# Patient Record
Sex: Female | Born: 1940
Health system: Southern US, Community
[De-identification: ages and names within clinical notes are randomized; demographics above are authoritative.]

## PROBLEM LIST (undated history)

## (undated) DIAGNOSIS — J189 Pneumonia, unspecified organism: Secondary | ICD-10-CM

## (undated) DIAGNOSIS — J449 Chronic obstructive pulmonary disease, unspecified: Secondary | ICD-10-CM

## (undated) DIAGNOSIS — K219 Gastro-esophageal reflux disease without esophagitis: Secondary | ICD-10-CM

## (undated) DIAGNOSIS — Z87442 Personal history of urinary calculi: Secondary | ICD-10-CM

## (undated) DIAGNOSIS — I5032 Chronic diastolic (congestive) heart failure: Secondary | ICD-10-CM

## (undated) DIAGNOSIS — I872 Venous insufficiency (chronic) (peripheral): Secondary | ICD-10-CM

## (undated) DIAGNOSIS — E039 Hypothyroidism, unspecified: Secondary | ICD-10-CM

## (undated) DIAGNOSIS — I10 Essential (primary) hypertension: Secondary | ICD-10-CM

## (undated) DIAGNOSIS — N189 Chronic kidney disease, unspecified: Secondary | ICD-10-CM

## (undated) DIAGNOSIS — J45909 Unspecified asthma, uncomplicated: Secondary | ICD-10-CM

## (undated) DIAGNOSIS — K2950 Unspecified chronic gastritis without bleeding: Secondary | ICD-10-CM

## (undated) DIAGNOSIS — Z9889 Other specified postprocedural states: Secondary | ICD-10-CM

## (undated) DIAGNOSIS — K5732 Diverticulitis of large intestine without perforation or abscess without bleeding: Secondary | ICD-10-CM

## (undated) DIAGNOSIS — H3530 Unspecified macular degeneration: Secondary | ICD-10-CM

## (undated) DIAGNOSIS — R011 Cardiac murmur, unspecified: Secondary | ICD-10-CM

## (undated) DIAGNOSIS — M1990 Unspecified osteoarthritis, unspecified site: Secondary | ICD-10-CM

## (undated) DIAGNOSIS — I82A12 Acute embolism and thrombosis of left axillary vein: Secondary | ICD-10-CM

## (undated) DIAGNOSIS — E785 Hyperlipidemia, unspecified: Secondary | ICD-10-CM

## (undated) DIAGNOSIS — E782 Mixed hyperlipidemia: Secondary | ICD-10-CM

## (undated) DIAGNOSIS — C50919 Malignant neoplasm of unspecified site of unspecified female breast: Secondary | ICD-10-CM

## (undated) DIAGNOSIS — H409 Unspecified glaucoma: Secondary | ICD-10-CM

## (undated) DIAGNOSIS — Z8719 Personal history of other diseases of the digestive system: Secondary | ICD-10-CM

## (undated) DIAGNOSIS — H35039 Hypertensive retinopathy, unspecified eye: Secondary | ICD-10-CM

## (undated) DIAGNOSIS — N200 Calculus of kidney: Secondary | ICD-10-CM

## (undated) DIAGNOSIS — R42 Dizziness and giddiness: Secondary | ICD-10-CM

## (undated) DIAGNOSIS — R112 Nausea with vomiting, unspecified: Secondary | ICD-10-CM

## (undated) DIAGNOSIS — M549 Dorsalgia, unspecified: Secondary | ICD-10-CM

## (undated) DIAGNOSIS — G589 Mononeuropathy, unspecified: Secondary | ICD-10-CM

## (undated) HISTORY — DX: Chronic diastolic (congestive) heart failure: I50.32

## (undated) HISTORY — PX: HERNIA REPAIR: SHX51

## (undated) HISTORY — DX: Hypertensive retinopathy, unspecified eye: H35.039

## (undated) HISTORY — DX: Malignant neoplasm of unspecified site of unspecified female breast: C50.919

## (undated) HISTORY — DX: Unspecified asthma, uncomplicated: J45.909

## (undated) HISTORY — PX: EYE SURGERY: SHX253

## (undated) HISTORY — DX: Gastro-esophageal reflux disease without esophagitis: K21.9

## (undated) HISTORY — DX: Mononeuropathy, unspecified: G58.9

## (undated) HISTORY — DX: Unspecified macular degeneration: H35.30

## (undated) HISTORY — PX: ABDOMINAL HYSTERECTOMY: SHX81

## (undated) HISTORY — PX: CATARACT EXTRACTION: SUR2

## (undated) HISTORY — DX: Calculus of kidney: N20.0

## (undated) HISTORY — DX: Chronic kidney disease, unspecified: N18.9

## (undated) HISTORY — PX: CHOLECYSTECTOMY: SHX55

## (undated) HISTORY — PX: COLOSTOMY CLOSURE: SHX1381

## (undated) HISTORY — DX: Mixed hyperlipidemia: E78.2

## (undated) HISTORY — PX: OTHER SURGICAL HISTORY: SHX169

## (undated) HISTORY — DX: Diverticulitis of large intestine without perforation or abscess without bleeding: K57.32

## (undated) HISTORY — PX: BACK SURGERY: SHX140

## (undated) HISTORY — DX: Unspecified glaucoma: H40.9

## (undated) HISTORY — PX: COLON SURGERY: SHX602

## (undated) HISTORY — DX: Dizziness and giddiness: R42

## (undated) HISTORY — DX: Venous insufficiency (chronic) (peripheral): I87.2

## (undated) HISTORY — DX: Hyperlipidemia, unspecified: E78.5

---

## 2000-06-20 ENCOUNTER — Inpatient Hospital Stay (HOSPITAL_COMMUNITY): Admission: RE | Admit: 2000-06-20 | Discharge: 2000-06-22 | Payer: Self-pay | Admitting: Neurosurgery

## 2000-07-22 ENCOUNTER — Encounter: Admission: RE | Admit: 2000-07-22 | Discharge: 2000-07-22 | Payer: Self-pay | Admitting: Neurosurgery

## 2000-09-27 ENCOUNTER — Encounter: Admission: RE | Admit: 2000-09-27 | Discharge: 2000-09-27 | Payer: Self-pay | Admitting: Neurosurgery

## 2000-12-09 ENCOUNTER — Ambulatory Visit (HOSPITAL_COMMUNITY): Admission: RE | Admit: 2000-12-09 | Discharge: 2000-12-09 | Payer: Self-pay | Admitting: Internal Medicine

## 2000-12-09 ENCOUNTER — Encounter: Payer: Self-pay | Admitting: Internal Medicine

## 2000-12-23 ENCOUNTER — Encounter: Admission: RE | Admit: 2000-12-23 | Discharge: 2000-12-23 | Payer: Self-pay | Admitting: Neurosurgery

## 2001-01-30 ENCOUNTER — Other Ambulatory Visit: Admission: RE | Admit: 2001-01-30 | Discharge: 2001-01-30 | Payer: Self-pay | Admitting: *Deleted

## 2001-03-09 ENCOUNTER — Ambulatory Visit (HOSPITAL_COMMUNITY): Admission: RE | Admit: 2001-03-09 | Discharge: 2001-03-09 | Payer: Self-pay | Admitting: Family Medicine

## 2001-03-09 ENCOUNTER — Encounter: Payer: Self-pay | Admitting: Family Medicine

## 2001-05-22 ENCOUNTER — Encounter: Payer: Self-pay | Admitting: Family Medicine

## 2001-05-22 ENCOUNTER — Ambulatory Visit (HOSPITAL_COMMUNITY): Admission: RE | Admit: 2001-05-22 | Discharge: 2001-05-22 | Payer: Self-pay | Admitting: Family Medicine

## 2001-11-24 ENCOUNTER — Ambulatory Visit (HOSPITAL_COMMUNITY): Admission: RE | Admit: 2001-11-24 | Discharge: 2001-11-24 | Payer: Self-pay | Admitting: Family Medicine

## 2001-11-24 ENCOUNTER — Encounter: Payer: Self-pay | Admitting: Family Medicine

## 2001-11-30 ENCOUNTER — Ambulatory Visit (HOSPITAL_COMMUNITY): Admission: RE | Admit: 2001-11-30 | Discharge: 2001-11-30 | Payer: Self-pay | Admitting: Urology

## 2001-11-30 ENCOUNTER — Encounter: Payer: Self-pay | Admitting: Urology

## 2002-01-30 ENCOUNTER — Encounter: Payer: Self-pay | Admitting: Emergency Medicine

## 2002-01-30 ENCOUNTER — Emergency Department (HOSPITAL_COMMUNITY): Admission: EM | Admit: 2002-01-30 | Discharge: 2002-01-30 | Payer: Self-pay | Admitting: Emergency Medicine

## 2002-03-16 ENCOUNTER — Ambulatory Visit (HOSPITAL_COMMUNITY): Admission: RE | Admit: 2002-03-16 | Discharge: 2002-03-16 | Payer: Self-pay | Admitting: Urology

## 2002-03-16 ENCOUNTER — Encounter: Payer: Self-pay | Admitting: Urology

## 2002-05-28 ENCOUNTER — Ambulatory Visit (HOSPITAL_COMMUNITY): Admission: RE | Admit: 2002-05-28 | Discharge: 2002-05-28 | Payer: Self-pay | Admitting: *Deleted

## 2002-05-28 ENCOUNTER — Encounter: Payer: Self-pay | Admitting: *Deleted

## 2003-05-10 ENCOUNTER — Ambulatory Visit (HOSPITAL_COMMUNITY): Admission: RE | Admit: 2003-05-10 | Discharge: 2003-05-10 | Payer: Self-pay | Admitting: Family Medicine

## 2003-05-15 ENCOUNTER — Ambulatory Visit (HOSPITAL_COMMUNITY): Admission: RE | Admit: 2003-05-15 | Discharge: 2003-05-15 | Payer: Self-pay | Admitting: Family Medicine

## 2003-06-05 ENCOUNTER — Ambulatory Visit (HOSPITAL_COMMUNITY): Admission: RE | Admit: 2003-06-05 | Discharge: 2003-06-05 | Payer: Self-pay | Admitting: *Deleted

## 2003-08-15 ENCOUNTER — Ambulatory Visit (HOSPITAL_COMMUNITY): Admission: RE | Admit: 2003-08-15 | Discharge: 2003-08-15 | Payer: Self-pay | Admitting: Family Medicine

## 2003-08-27 ENCOUNTER — Encounter (HOSPITAL_COMMUNITY): Admission: RE | Admit: 2003-08-27 | Discharge: 2003-09-26 | Payer: Self-pay | Admitting: Neurosurgery

## 2003-10-01 ENCOUNTER — Encounter (HOSPITAL_COMMUNITY): Admission: RE | Admit: 2003-10-01 | Discharge: 2003-10-31 | Payer: Self-pay | Admitting: Neurosurgery

## 2003-11-06 ENCOUNTER — Encounter (HOSPITAL_COMMUNITY): Admission: RE | Admit: 2003-11-06 | Discharge: 2003-12-06 | Payer: Self-pay | Admitting: General Surgery

## 2004-07-23 ENCOUNTER — Ambulatory Visit (HOSPITAL_COMMUNITY): Admission: RE | Admit: 2004-07-23 | Discharge: 2004-07-23 | Payer: Self-pay | Admitting: *Deleted

## 2005-02-03 ENCOUNTER — Encounter (HOSPITAL_COMMUNITY): Admission: RE | Admit: 2005-02-03 | Discharge: 2005-02-04 | Payer: Self-pay | Admitting: Internal Medicine

## 2005-04-27 ENCOUNTER — Ambulatory Visit: Payer: Self-pay | Admitting: Internal Medicine

## 2005-05-17 ENCOUNTER — Ambulatory Visit (HOSPITAL_COMMUNITY): Admission: RE | Admit: 2005-05-17 | Discharge: 2005-05-17 | Payer: Self-pay | Admitting: Internal Medicine

## 2005-05-17 ENCOUNTER — Encounter (INDEPENDENT_AMBULATORY_CARE_PROVIDER_SITE_OTHER): Payer: Self-pay | Admitting: Internal Medicine

## 2005-05-17 ENCOUNTER — Ambulatory Visit: Payer: Self-pay | Admitting: Internal Medicine

## 2005-06-11 ENCOUNTER — Inpatient Hospital Stay (HOSPITAL_COMMUNITY): Admission: EM | Admit: 2005-06-11 | Discharge: 2005-06-23 | Payer: Self-pay | Admitting: Emergency Medicine

## 2005-06-11 ENCOUNTER — Ambulatory Visit: Payer: Self-pay | Admitting: *Deleted

## 2005-06-30 ENCOUNTER — Ambulatory Visit (HOSPITAL_COMMUNITY): Admission: RE | Admit: 2005-06-30 | Discharge: 2005-06-30 | Payer: Self-pay | Admitting: Family Medicine

## 2005-08-02 ENCOUNTER — Ambulatory Visit (HOSPITAL_COMMUNITY): Admission: RE | Admit: 2005-08-02 | Discharge: 2005-08-02 | Payer: Self-pay | Admitting: Family Medicine

## 2005-10-19 ENCOUNTER — Encounter (HOSPITAL_COMMUNITY): Admission: RE | Admit: 2005-10-19 | Discharge: 2005-11-18 | Payer: Self-pay | Admitting: Endocrinology

## 2006-07-14 ENCOUNTER — Inpatient Hospital Stay (HOSPITAL_COMMUNITY): Admission: EM | Admit: 2006-07-14 | Discharge: 2006-07-28 | Payer: Self-pay | Admitting: Emergency Medicine

## 2006-07-15 ENCOUNTER — Ambulatory Visit: Payer: Self-pay | Admitting: Internal Medicine

## 2006-07-18 ENCOUNTER — Encounter (INDEPENDENT_AMBULATORY_CARE_PROVIDER_SITE_OTHER): Payer: Self-pay | Admitting: *Deleted

## 2006-09-29 ENCOUNTER — Ambulatory Visit: Payer: Self-pay | Admitting: *Deleted

## 2006-10-04 ENCOUNTER — Ambulatory Visit (HOSPITAL_COMMUNITY): Admission: RE | Admit: 2006-10-04 | Discharge: 2006-10-04 | Payer: Self-pay | Admitting: *Deleted

## 2006-10-04 ENCOUNTER — Encounter (INDEPENDENT_AMBULATORY_CARE_PROVIDER_SITE_OTHER): Payer: Self-pay | Admitting: *Deleted

## 2006-10-04 ENCOUNTER — Ambulatory Visit: Payer: Self-pay | Admitting: *Deleted

## 2006-10-20 ENCOUNTER — Ambulatory Visit: Payer: Self-pay | Admitting: *Deleted

## 2006-11-21 ENCOUNTER — Inpatient Hospital Stay (HOSPITAL_COMMUNITY): Admission: RE | Admit: 2006-11-21 | Discharge: 2006-11-27 | Payer: Self-pay | Admitting: General Surgery

## 2007-04-18 ENCOUNTER — Ambulatory Visit (HOSPITAL_COMMUNITY): Admission: RE | Admit: 2007-04-18 | Discharge: 2007-04-18 | Payer: Self-pay | Admitting: Family Medicine

## 2009-01-08 ENCOUNTER — Emergency Department (HOSPITAL_COMMUNITY): Admission: EM | Admit: 2009-01-08 | Discharge: 2009-01-08 | Payer: Self-pay | Admitting: Psychiatry

## 2009-01-28 HISTORY — PX: NM MYOCAR PERF WALL MOTION: HXRAD629

## 2009-07-02 ENCOUNTER — Ambulatory Visit (HOSPITAL_COMMUNITY): Admission: RE | Admit: 2009-07-02 | Discharge: 2009-07-02 | Payer: Self-pay | Admitting: Family Medicine

## 2010-02-11 HISTORY — PX: US ECHOCARDIOGRAPHY: HXRAD669

## 2010-02-16 ENCOUNTER — Emergency Department (HOSPITAL_COMMUNITY): Admission: EM | Admit: 2010-02-16 | Discharge: 2010-02-16 | Payer: Self-pay | Admitting: Emergency Medicine

## 2010-03-19 ENCOUNTER — Encounter: Admission: RE | Admit: 2010-03-19 | Discharge: 2010-03-19 | Payer: Self-pay | Admitting: Neurosurgery

## 2010-04-07 ENCOUNTER — Encounter (HOSPITAL_COMMUNITY): Admission: RE | Admit: 2010-04-07 | Discharge: 2010-05-07 | Payer: Self-pay | Admitting: Otolaryngology

## 2010-04-30 ENCOUNTER — Encounter (INDEPENDENT_AMBULATORY_CARE_PROVIDER_SITE_OTHER): Payer: Self-pay | Admitting: *Deleted

## 2010-05-07 ENCOUNTER — Encounter (HOSPITAL_COMMUNITY)
Admission: RE | Admit: 2010-05-07 | Discharge: 2010-06-06 | Payer: Self-pay | Source: Home / Self Care | Admitting: Neurosurgery

## 2010-05-12 ENCOUNTER — Encounter (HOSPITAL_COMMUNITY)
Admission: RE | Admit: 2010-05-12 | Discharge: 2010-06-11 | Payer: Self-pay | Source: Home / Self Care | Attending: Otolaryngology | Admitting: Otolaryngology

## 2010-06-08 ENCOUNTER — Encounter (HOSPITAL_COMMUNITY)
Admission: RE | Admit: 2010-06-08 | Discharge: 2010-07-08 | Payer: Self-pay | Source: Home / Self Care | Attending: Neurosurgery | Admitting: Neurosurgery

## 2010-07-25 ENCOUNTER — Encounter: Payer: Self-pay | Admitting: *Deleted

## 2010-08-04 NOTE — Letter (Signed)
Summary: Recall, Screening Colonoscopy Only  [facility]  [address]   [place], [place] [address]   Phone: [phone]  Fax: [phone]    April 30, 2010  [name] [address] [place], [place]  [address] 04-02-1941   Dear [name],   Our records indicate it is time to schedule your colonoscopy.    Please call our office at [phone] and ask for the nurse.   Thank you,    Hendricks Limes, LPN Cloria Spring, LPN  [facility] Ph: [phone]   Fax: [phone]

## 2010-09-24 ENCOUNTER — Ambulatory Visit (HOSPITAL_COMMUNITY)
Admission: RE | Admit: 2010-09-24 | Discharge: 2010-09-24 | Disposition: A | Discharge status: Home / Self Care | Payer: Medicare Other | Source: Ambulatory Visit | Attending: Family Medicine | Admitting: Family Medicine

## 2010-09-24 ENCOUNTER — Encounter (HOSPITAL_COMMUNITY): Payer: Self-pay

## 2010-09-24 ENCOUNTER — Other Ambulatory Visit (HOSPITAL_COMMUNITY): Payer: Self-pay | Admitting: Family Medicine

## 2010-09-24 DIAGNOSIS — R05 Cough: Secondary | ICD-10-CM

## 2010-09-24 DIAGNOSIS — R059 Cough, unspecified: Secondary | ICD-10-CM

## 2010-09-24 DIAGNOSIS — R509 Fever, unspecified: Secondary | ICD-10-CM | POA: Insufficient documentation

## 2010-09-24 DIAGNOSIS — J069 Acute upper respiratory infection, unspecified: Secondary | ICD-10-CM

## 2010-09-24 HISTORY — DX: Essential (primary) hypertension: I10

## 2010-10-08 ENCOUNTER — Ambulatory Visit (INDEPENDENT_AMBULATORY_CARE_PROVIDER_SITE_OTHER): Payer: Medicare Other | Admitting: Otolaryngology

## 2010-10-08 DIAGNOSIS — J343 Hypertrophy of nasal turbinates: Secondary | ICD-10-CM

## 2010-10-08 DIAGNOSIS — J310 Chronic rhinitis: Secondary | ICD-10-CM

## 2010-10-08 DIAGNOSIS — J320 Chronic maxillary sinusitis: Secondary | ICD-10-CM

## 2010-10-11 LAB — DIFFERENTIAL
Eosinophils Absolute: 0.1 10*3/uL (ref 0.0–0.7)
Eosinophils Relative: 1 % (ref 0–5)
Lymphocytes Relative: 40 % (ref 12–46)
Lymphs Abs: 3.9 10*3/uL (ref 0.7–4.0)
Monocytes Absolute: 0.6 10*3/uL (ref 0.1–1.0)

## 2010-10-11 LAB — CK TOTAL AND CKMB (NOT AT ARMC)
CK, MB: 3.3 ng/mL (ref 0.3–4.0)
Relative Index: 3.3 — ABNORMAL HIGH (ref 0.0–2.5)

## 2010-10-11 LAB — COMPREHENSIVE METABOLIC PANEL
Albumin: 4.7 g/dL (ref 3.5–5.2)
CO2: 30 mEq/L (ref 19–32)
Calcium: 10.4 mg/dL (ref 8.4–10.5)
GFR calc Af Amer: >60 mL/min (ref >60)
Glucose, Bld: 110 mg/dL — ABNORMAL HIGH (ref 70–99)
Potassium: 3.3 mEq/L — ABNORMAL LOW (ref 3.5–5.1)
Sodium: 140 mEq/L (ref 135–145)
Total Protein: 7.7 g/dL (ref 6.0–8.3)

## 2010-10-11 LAB — CBC
HCT: 42.2 % (ref 36.0–46.0)
MCHC: 34.8 g/dL (ref 30.0–36.0)
RBC: 4.61 MIL/uL (ref 3.87–5.11)
RDW: 13.5 % (ref 11.5–15.5)

## 2010-10-11 LAB — POCT CARDIAC MARKERS: Myoglobin, poc: 123 ng/mL (ref 12–200)

## 2010-10-14 ENCOUNTER — Other Ambulatory Visit (INDEPENDENT_AMBULATORY_CARE_PROVIDER_SITE_OTHER): Payer: Self-pay | Admitting: Otolaryngology

## 2010-10-14 DIAGNOSIS — J329 Chronic sinusitis, unspecified: Secondary | ICD-10-CM

## 2010-10-19 ENCOUNTER — Ambulatory Visit (HOSPITAL_COMMUNITY)
Admission: RE | Admit: 2010-10-19 | Discharge: 2010-10-19 | Disposition: A | Discharge status: Home / Self Care | Payer: Medicare Other | Source: Ambulatory Visit | Attending: Otolaryngology | Admitting: Otolaryngology

## 2010-10-19 DIAGNOSIS — R51 Headache: Secondary | ICD-10-CM | POA: Insufficient documentation

## 2010-10-19 DIAGNOSIS — J3489 Other specified disorders of nose and nasal sinuses: Secondary | ICD-10-CM | POA: Insufficient documentation

## 2010-10-19 DIAGNOSIS — R059 Cough, unspecified: Secondary | ICD-10-CM | POA: Insufficient documentation

## 2010-10-19 DIAGNOSIS — R05 Cough: Secondary | ICD-10-CM | POA: Insufficient documentation

## 2010-10-19 DIAGNOSIS — J329 Chronic sinusitis, unspecified: Secondary | ICD-10-CM

## 2010-10-29 ENCOUNTER — Ambulatory Visit (INDEPENDENT_AMBULATORY_CARE_PROVIDER_SITE_OTHER): Payer: Medicare Other | Admitting: Otolaryngology

## 2010-10-29 DIAGNOSIS — J310 Chronic rhinitis: Secondary | ICD-10-CM

## 2010-10-29 DIAGNOSIS — R490 Dysphonia: Secondary | ICD-10-CM

## 2010-11-20 NOTE — Consult Note (Signed)
[name], [name]               ACCOUNT NO.:  [id number]   MEDICAL RECORD NO.:  [id number]          PATIENT TYPE:  INP   LOCATION:  A203                          FACILITY:  [facility]   PHYSICIAN:  [name], M.D.DATE OF BIRTH:  1940/11/26   DATE OF CONSULTATION:  06/15/2005  DATE OF DISCHARGE:                                   CONSULTATION   REFERRING PHYSICIAN:  Patrica [name], M.D.   REASON FOR CONSULTATION:  Persistent bronchospasm.   HISTORY OF PRESENT ILLNESS:  This is a 70 year old who has a long known  history of possible COPD.  She says that she has been sick for about a  month.  She has been in and out of Dr. Geanie [name] office with congestion and  cough.  She has been being treated with antibiotics, steroids, etc.  She has  had increasing symptoms in the last several days to the point that she was  admitted because of her continued symptoms.   PAST MEDICAL HISTORY:  1.  History of thyroid nodules.  2.  Rheumatic fever in 1961.  3.  Helicobacter pylori gastritis in 1997.  4.  Laparoscopic cholecystectomy in 1999.  5.  Bladder tacking.  6.  Hysterectomy.  7.  Cervical spine surgery.  8.  Bilateral laser eye surgery.  9.  She says that she has had some reflux disease as well.  10. In the outpatient arena she has been treated with cough medications,      antibiotics and steroids.   MEDICATIONS ON ADMISSION:  1.  Levoxyl 50 mcg daily.  2.  Nexium 40 mg daily.  3.  Aspirin 81 mg daily.  4.  Advair 250/50 b.i.d.  5.  Prednisone.   ALLERGIES:  CODEINE, DOLOBID, IBUPROFEN and SULFA.   FAMILY HISTORY:  She says she does not know of any family history of any  sort of lung disease.   SOCIAL HISTORY:  She smokes about a pack of cigarettes daily.  She does not  drink any alcohol.   REVIEW OF SYSTEMS:  Except as mentioned is essentially negative.   PHYSICAL EXAMINATION:  GENERAL:  Physical exam shows a somewhat obese female  who is in no acute distress.  VITAL  SIGNS:  Temperature is 98.2, blood pressure 142/72, pulse 74,  respirations 24.  HEENT:  Her pupils react to light and accommodation.  Nose and throat are  clear.  NECK:  Supple without masses.  CHEST:  Her chest shows rhonchi bilaterally.  She sounds somewhat tight.  HEART:  Her heart sounds are distant.  I do not hear a murmur.  It is been  said that she has one, but I cannot hear it now.  ABDOMEN:  Her abdomen is soft without masses.  EXTREMITIES:  Showed no edema.  NEUROLOGIC:  Grossly intact.   LABORATORY DATA AND X-RAY FINDINGS:  Lab work shows white count 14,900,  hemoglobin 12.1, platelets 275.  BMET shows her glucose is 310, BUN of 10,  creatinine 0.8.   She had CT of the chest which shows no evidence of pulmonary embolus, no  pleural or  pericardial effusion, atelectasis in the left lower lobe, some  scarring, some ground glass opacities in the left upper lobe.   ASSESSMENT:  She has probably chronic obstructive pulmonary disease.  I  suspect that she has a pneumonia and that is what we are seeing on the x-ray  and computed tomography.  With her not having any evidence of embolus, of  course, that is good news and rules out one of the causes of her persistent  bronchospasm.  She has not had an echocardiogram recently.   RECOMMENDATIONS:  I would go ahead and do one because of her history of  rheumatic fever.  I agree with treatments with steroids, inhaled  bronchodilators and antibiotics.      [name], M.D.  Electronically Signed     ELH/MEDQ  D:  06/15/2005  T:  06/15/2005  Job:  [id number]

## 2010-11-20 NOTE — Op Note (Signed)
[name], [name]               ACCOUNT NO.:  [id number]   MEDICAL RECORD NO.:  [id number]          PATIENT TYPE:  AMB   LOCATION:  DAY                           FACILITY:  [facility]   PHYSICIAN:  Lionel December, M.D.    DATE OF BIRTH:  07-Jan-1941   DATE OF PROCEDURE:  05/17/2005  DATE OF DISCHARGE:                                 OPERATIVE REPORT   PROCEDURE:  Colonoscopy.   INDICATIONS:  [name] is a 70 year old Caucasian female who is undergoing  colonoscopy both for diagnostic and screening purposes. She has been having  intermittent hematochezia felt to be secondary to hemorrhoids. Family  history is positive for colon carcinoma in her sister who was in her late  39s at the time of diagnosis. [name]'s last colonoscopy was in June 1998 with  removal of small polyps which were inflammatory and were hyperplastic.  Procedure risks were reviewed with the patient, and informed consent was  obtained.   PREOPERATIVE MEDICATIONS:  Demerol 25 mg IV, Versed 5 mg IV in divided dose.   FINDINGS:  Procedure performed in endoscopy suite. The patient's vital signs  and O2 saturation were monitored during the procedure and remained stable.  The patient was placed in left lateral position, and rectal examination  performed. No abnormality noted on external or digital exam. Olympus  videoscope was placed in rectum and advanced under vision into sigmoid colon  and beyond. Preparation was excellent. Few diverticula were noted at sigmoid  colon. Scope was advanced to cecum which was identified by ileocecal valve  and appendiceal orifice. Pictures taken for the record. As the scope was  withdrawn, colonic mucosa was carefully examined. There was a small polyp at  ascending colon which was ablated via cold biopsy. Two more polyps were  noted at hepatic flexure. One was small and ablated via cold biopsy.  Approach to the other one was difficulty. It was biopsied for histology, and  the rest of the polyp  was coagulated using snare tip. Endoscopically, it  appeared hyperplastic polyp to me. Both of these polyps were submitted in  one container. The mucosa of the rest of the colon was normal. Rectal mucosa  similarly was normal. Scope was retroflexed to examine anorectal junction  which was unremarkable. Endoscope was straightened and withdrawn. The  patient tolerated the procedure well.   FINAL DIAGNOSES:  1.  Few diverticula at sigmoid colon.  2.  Small polyp ablated via cold biopsy at ascending colon. Another small      polyp at hepatic flexure was ablated via cold biopsy, and third polyp      was larger and sessile. It was biopsied for histology, and rest was      coagulated using snare tip.   RECOMMENDATIONS:  1.  Standard instructions given.  2.  High fiber diet.  3.  I will be contacting patient with biopsy results and timing of her next      exam.      Lionel December, M.D.  Electronically Signed     NR/MEDQ  D:  05/17/2005  T:  05/17/2005  Job:  [id number]   cc:   Patrica Duel, M.D.  Fax: [phone]

## 2010-11-20 NOTE — Op Note (Signed)
[facility]  Patient:    [name], [name]                      MRN: [id number] Proc. Date: 06/20/00 Adm. Date:  70 Attending:  Tressie Stalker D                           Operative Report  PREOPERATIVE DIAGNOSIS:  C4-5 and C5-6 herniated nucleus pulposus, spondylosis, spinal stenosis, cervical degenerative disk disease.  POSTOPERATIVE DIAGNOSIS:  C4-5 and C5-6 herniated nucleus pulposus, spondylosis, spinal stenosis, cervical degenerative disk disease.  OPERATION PERFORMED:  C4-5 and C5-6 extensive anterior cervical diskectomy, interbody iliac crest allograft arthrodesis, anterior cervical plating C4 to C6 using Codman titanium anterior plate and screws.  SURGEON:  Cristi Loron, M.D.  ASSISTANT:  Hewitt Shorts, M.D.  ANESTHESIA:  General endotracheal.  ESTIMATED BLOOD LOSS:  150 cc.  SPECIMENS:  None.  DRAINS:  None.  COMPLICATIONS:  None.  INDICATIONS FOR PROCEDURE:  The patient is a 70 year old white female who suffered from neck and bilateral arm pain with hand numbness for several months.  She failed medical management and work-up with a cervical MRI demonstrated significant spondylosis and spinal stenosis at C4-5 and C5-6.  I therefore discussed surgery with her.  She weighed the risks, benefits and alternatives to surgery and decided to proceed with a two-level anterior cervical diskectomy and fusion plating.  DESCRIPTION OF PROCEDURE:  The patient was brought to the operating room by the anesthesia team.  General endotracheal anesthesia was induced.  The patient remained in supine position.  A roll was placed under her shoulders to place her neck in slight extension.  Her anterior cervical region was then prepared with Betadine scrub and Betadine solution.  Sterile drapes were applied.  I then injected the area to be incised with Marcaine with epinephrine solution and used a scalpel to make a transverse  incision in the patients left anterior neck.  I then used the Metzenbaum scissors to dissect down to the platysma muscle and divided along the direction of the skin incision and then dissected medial to the sternocleidomastoid muscle, jugular vein and carotid artery. I carefully dissected down to the anterior cervical spine identifying the esophagus and carefully retracting it medially.  I cleared the soft tissue from the anterior cervical spine using Kittner swabs. I then obtained intraoperative radiograph to confirm my location after I inserted a bent spinal needle into the exposed interspace.  I then used electrocautery to detach the medial border of the longus colli muscle bilaterally from the C4-5 and C5-6 intervertebral disk space.  I inserted the Caspar self-retaining retractor for exposure.  I began the decompression at C5-6.  There was a lot of spondylosis anteriorly.  I drilled away some anterior bone spurs from the disk space and then sized these to C5-6 intervertebral disk.  I performed a partial diskectomy with the pituitaries and forceps and the Carlens curets.  I then inserted distraction screws in C5-6 distracting the interspace and then decorticating the C5 and C6 vertebral end plates drilling away the remainder of the intervertebral disk.  It was quite a small disk and there was a lot of spondylosis.  I drilled away the bone spurs with a high speed drill.  I thinned out the posterior longitudinal ligament with a drill, incised it with the arachnoid knife and then removed it with a Kerrison punch undercutting the  vertebral end plates at C5 and C6.  I identified the C6 nerve roots and performed a generous foraminotomy about it bilaterally.  I had good decompression of the thecal sac and bilateral C6 nerve roots.   I then repeated this procedure at C4-5 removing these distraction screw at C6 placing the C4 distracting interspace, drilled away some anterior osteophytes  incising the C4-5 intervertebral disk, performed a partial diskectomy with the pituitary forceps, then using the drill to drill away the remainder of the intervertebral disk. I thinned out the posterior longitudinal ligament drilling off some bone spurs.  I incised the ligament with the arachnoid knife and then removed it with a Kerrison punch undercutting the vertebral end plates at V3-7 decompressing the thecal sac.  I performed a foraminotomy about the bilateral C5 nerve root.  At this point I had a good decompression of the thecal sac at C4-5 and the bilateral C5 nerve roots.  Having completed the extensive anterior cervical diskectomy, I now turned my attention to the interbody arthrodesis.  I obtained an iliac crest tricortical allograft bone graft and fashioned in to these approximate dimensions, 7 mm in height, 1 cm in depth. I inserted one bone graft into distracted C4-5 interspace, removed the distraction screw from C4, placed in C6 distraction C5-6 interspace.  Put the second bone graft at the C5-6 interspace.  I removed the distraction screw. There was good snug fit of the bone graft at both levels.  Having completed the arthrodesis, I now turned my attention to the anterior spinal instrumentation.  I drilled away some bone spurs from the anterior vertebral bodies at C4-5 and C5-6, so that the plate would lie relatively flat.  I then obtained the appropriate length Codman anterior cervical plate laying along the anterior aspect of C4 to C6.  I drilled two holes at C4, two at C5 and two at C6, tapped these holes and secured the plates to the vertebral bodies with 12 mm screws.  I then obtained the intraoperative radiograph.  It demonstrated good position of the plates, screws and interbody grafts.  I then secured the screws to the plate with a [facility] tightner.   I copiously irrigated the wound out with bacitracin solution and removed the solution and achieved stringent  hemostasis.  I then removed the Caspar self-retaining retractor.  I inspected ____________ any damage and there was none.  I then reapproximated the patients platysma muscle with interrupted  3-0 Vicryl and subcutaneous tissues with interrupted 3-0 Vicryl, the skin with Steri-Strips and benzoin.  The wound was then coated with bacitracin ointment. Sterile dressing was applied, the drapes were removed.  The patient was subsequently extubated by the anesthesia team and transported to the post anesthesia care unit in stable condition.  All sponge, needle and instrument counts were correct at the end of this case. DD:  06/20/00 TD:  06/21/00 Job: [id number]

## 2010-11-20 NOTE — Op Note (Signed)
[name], [name]               ACCOUNT NO.:  [id number]   MEDICAL RECORD NO.:  [id number]          PATIENT TYPE:  INP   LOCATION:  A304                          FACILITY:  [facility]   PHYSICIAN:  R. Roetta Sessions, M.D. DATE OF BIRTH:  [birth year]   DATE OF PROCEDURE:  07/15/2006  DATE OF DISCHARGE:  07/28/2006                               OPERATIVE REPORT   INDICATIONS FOR PROCEDURE:  The patient is a 70 year old lady with  recent fairly acute onset lower abdominal pain and reported diarrhea for  10 days.  She has had two CT scans which I have personally reviewed with  the radiologist.   Appears she does have a bolus of stool in her distal sigmoid colon.  There is no inflammatory changes in this area and no free air or other  acute changes.   She has not responded to conservative measures and the etiology for  abdominal pain was not yet been well-defined.  I am concerned she may be  having spurious diarrhea around a more proximal impaction.  Sigmoidoscopy now being done.  This approach has discussed the patient  at length.  Potential risks, benefits and was have been reviewed.   PROCEDURE NOTE:  Patient placed left lateral decubitus position. O2  saturation, blood pressure, pulse and respirations monitored throughout  the entire procedure.   INSTRUMENT:  Pentax video chip system.   Digital rectal exam revealed no abnormalities.   ENDOSCOPIC FINDINGS:  This was an unprepped procedure.  The rectal  mucosa including a retroflex view of the anal verge appeared normal.  At  the rectosigmoid junction and the lumen of the lower bowel appeared to  be plugged with a wall of underlying formed stool.  The level of this  apparent proximal impaction was 22 cm.  The patient tolerated the  procedure well.   ASSESSMENT:  The lumen of rectosigmoid occluded with stool at 22 cm.   RECOMMENDATIONS:  Will go ahead and obtained a diagnostic therapeutic  Gastrografin enema into the near future.   Further recommendations to  follow.      Jonathon Bellows, M.D.  Electronically Signed     RMR/MEDQ  D:  08/03/2006  T:  08/03/2006  Job:  [id number]   cc:   Patrica Duel, M.D.  Fax: [phone]

## 2010-11-20 NOTE — Group Therapy Note (Signed)
NAMEEMAAN, GARY               ACCOUNT NO.:  [id number]   MEDICAL RECORD NO.:  [id number]          PATIENT TYPE:  INP   LOCATION:  A203                          FACILITY:  [facility]   PHYSICIAN:  Edward L. Juanetta Gosling, M.D.DATE OF BIRTH:  Apr 10, 1941   DATE OF PROCEDURE:  DATE OF DISCHARGE:                                   PROGRESS NOTE   Mrs. Fojtik says she feels better. She brought up a great deal of sputum  with the Mucomyst. She is overall feeling better and has no new complaints.   Her exam shows her temperature is 97, pulse 82, respirations 20, blood sugar  144, blood pressure 136/72. O2 sat is 94% on 2 liters. Her chest is clearer  but still with problems.   Assessment is that she is doing better.   Plan is to continue with current treatment. She is to have an echocardiogram  today.      Edward L. Juanetta Gosling, M.D.  Electronically Signed     ELH/MEDQ  D:  06/16/2005  T:  06/16/2005  Job:  [id number]

## 2010-11-20 NOTE — Group Therapy Note (Signed)
NAMEMARRIAN, BELLS               ACCOUNT NO.:  [id number]   MEDICAL RECORD NO.:  [id number]          PATIENT TYPE:  INP   LOCATION:  A203                          FACILITY:  [facility]   PHYSICIAN:  Edward L. Juanetta Gosling, M.D.DATE OF BIRTH:  09-28-1940   DATE OF PROCEDURE:  06/18/2005  DATE OF DISCHARGE:                                   PROGRESS NOTE   SUBJECTIVE:  Ms. Hoggard seems to be gradually improving.  Dr. Nobie Putnam has  ordered physical therapy to try to get her more active.   OBJECTIVE:  VITAL SIGNS:  Her exam shows temperature 97.1, pulse 82,  respirations 20, blood sugar 166, blood pressure 152/76, O2 saturations 96%  on 2 L.  CHEST:  She still has rhonchi bilaterally.   ASSESSMENT:  She is better and slowly improving.   PLAN:  Plan is to continue treatments and follow.      Edward L. Juanetta Gosling, M.D.  Electronically Signed     ELH/MEDQ  D:  06/18/2005  T:  06/18/2005  Job:  [id number]

## 2010-11-20 NOTE — Group Therapy Note (Signed)
[name]               ACCOUNT NO.:  [id number]   MEDICAL RECORD NO.:  [id number]          PATIENT TYPE:  INP   LOCATION:  A304                          FACILITY:  [facility]   PHYSICIAN:  Edward L. Juanetta Gosling, M.D.DATE OF BIRTH:  01-Jan-1941   DATE OF PROCEDURE:  06/23/2005  DATE OF DISCHARGE:                                   PROGRESS NOTE   SUBJECTIVE:  [name] seems to be doing better in general and, in fact,  her chest is clear today.  She still breathing rapidly, however.   OBJECTIVE:  CHESTS:  Clear.  VITAL SIGNS:  Shows a temperature is 97.7, pulse 75, respirations 22, blood  sugar 190, blood pressure 132/67, O2 saturations 94% on room air.   ASSESSMENT:  She is much improved.   PLAN:  I am going to go ahead and sign off at this point.  Thank you very  much for allowing me to see her with you.  I will of course be happy to see  her in my office for follow-up at your request.      Ramon Dredge L. Juanetta Gosling, M.D.  Electronically Signed     ELH/MEDQ  D:  06/23/2005  T:  06/24/2005  Job:  [id number]

## 2010-11-20 NOTE — Discharge Summary (Signed)
NAME:  [name], CARLES NO.:  [id number]   MEDICAL RECORD NO.:  [id number]          PATIENT TYPE:  INP   LOCATION:  A326                          FACILITY:  [facility]   PHYSICIAN:  Dalia Heading, M.D.  DATE OF BIRTH:  1940/12/01   DATE OF ADMISSION:  11/21/2006  DATE OF DISCHARGE:  05/25/2008LH                               DISCHARGE SUMMARY   HOSPITAL COURSE SUMMARY:  The patient is a 70 year old white female  status post a Hartmann's procedure for perforated sigmoid diverticulitis  who presented to day surgery for reversal of her colostomy.  This was  performed on 11/21/2006.  Her postoperative course has been remarkable  only for pain control.  Her diet was advanced without difficulty once  her bowel function returned.  She has been ambulating  in the hallway  well.  She is being discharged home on 11/27/2006 in good improving  condition.   DISCHARGE INSTRUCTIONS:  The patient is to follow up with Dr. Franky Macho on 12/01/2006.  Discharge medications include loratadine 10 mg  p.o. daily, Nexium 40 mg p.o. daily, meclizine 25 mg p.o. daily,  hydrochlorothiazide 25 mg p.o. every other day daily, Lumigan eyedrops,  albuterol p.r.n., nasal spray daily, Advair 250 - 50 mg p.o. daily,  Singulair 10 mg p.o. daily, Darvocet N 100 1-2 tablets p.o. q.4 h p.r.n.  pain, Duke's solution 5 mL swish and swallow five times a day.   PRINCIPAL DIAGNOSES:  1. Status post Hartmann's procedure for perforated diverticulitis  2. COPD  3. Anxiety disorder  4. Stomatitis   PRINCIPAL PROCEDURE:  Colostomy reversal on 11/21/2006.      Dalia Heading, M.D.  Electronically Signed     MAJ/MEDQ  D:  11/27/2006  T:  11/27/2006  Job:  [id number]   cc:   Patrica Duel, M.D.  Fax: [phone]

## 2010-11-20 NOTE — Procedures (Signed)
   NAME:  [name], [name]                         ACCOUNT NO.:  [id number]   MEDICAL RECORD NO.:  [id number]                   PATIENT TYPE:  OUT   LOCATION:  RAD                                  FACILITY:  [facility]   PHYSICIAN:  Edward L. Juanetta Gosling, M.D.             DATE OF BIRTH:  03-Mar-1941   DATE OF PROCEDURE:  DATE OF DISCHARGE:                              PULMONARY FUNCTION TEST   FINDINGS:  1. Spirometry shows normal flows with the exception of the FEF 25-75 which     may indicate air-flow obstruction in the area of the small airways.  2. FEF 25-75 improved by 20% with inhaled bronchodilator, but the only well-     established criteria for bronchodilator response are based on forced     vital capacity and FEV1.      ___________________________________________                                            Oneal Deputy. Juanetta Gosling, M.D.   ELH/MEDQ  D:  05/15/2003  T:  05/15/2003  Job:  [id number]   cc:   Corrie Mckusick, M.D.  [address] [place]. [place]  [place]   [id number]  Fax: [phone]

## 2010-11-20 NOTE — Discharge Summary (Signed)
[place]  Patient:    [name], [name]                      MRN: [id number] Adm. Date:  [id number] Disc. Date: [id number] Attending:  Tressie Stalker D                           Discharge Summary  For full details of this admission please refer to typed history and physical.  HISTORY OF PRESENT ILLNESS:  Briefly, the patient is a 71 year old white female who suffers from neck and bilateral arm pain with hand numbness for several months.  She failed medical management and was worked up as an outpatient with a cervical MRI demonstrating significant spondylosis with spinal stenosis at C4-5 and C5-6.  I therefore discussed the various treatment options with her including surgery.  Patient decided to proceed with the operation.  PAST MEDICAL HISTORY/PAST SURGICAL HISTORY/MEDICATIONS PRIOR TO ADMISSION/DRUG ALLERGIES/FAMILY MEDICAL HISTORY/SOCIAL HISTORY/ADMISSION PHYSICAL EXAMINATION/IMAGING STUDIES/ASSESSMENT AND PLAN/ETC:  Please refer to typed history and physical.  HOSPITAL COURSE:  I performed a C4-5 and C5-6 extensive anterior cervical diskectomy and interbody iliac crest allograft arthrodesis with anterior cervical plating C4-C6 (Codman) without complications on June 20, 2000 (for full details of this operation please refer to typed operative note).  POSTOPERATIVE COURSE:  The patients postoperative course initially was remarkable for some pain and mild dysphagia.  She was observed to postop day #2 and the dysphagia resolved, her pain was under good control with p.o. medications, and she was discharged to home on postop day #2.  At this time she was afebrile, her vital signs were stable, she eating well, ambulating well, her wound was healing well without signs of infection, and she had normal motor strength.  DISCHARGE INSTRUCTIONS:  The patient was given written discharge instructions. Instructed to resume her outpatient medical  regimen and to follow up with me in four weeks.  DISCHARGE MEDICATIONS:  Valium 5 mg #40 one p.o. q.6h. p.r.n. for muscle spasms and one refill and Demerol 50 mg #60 one to two p.o. q.4h. p.r.n. for pain no refills.  FINAL DIAGNOSES: 1. C4-5 and C5-6 herniated nucleus pulposus. 2. Spondylosis. 3. Spinal stenosis. 4. Cervical degenerative disk disease.  PROCEDURE PERFORMED:  C4-5 and C5-6 extensive anterior cervical diskectomy, interbody iliac crest allograft arthrodesis, anterior cervical plating C4-C6 using Codman titanium anterior plate and screws. DD:  06/22/00 TD:  06/23/00 Job: [id number]

## 2010-11-20 NOTE — Procedures (Signed)
[name], [name] NO.:  [id number]   MEDICAL RECORD NO.:  [id number]          PATIENT TYPE:  INP   LOCATION:  A203                          FACILITY:  [facility]   PHYSICIAN:  Vida Roller, M.D.   DATE OF BIRTH:  21-Feb-1941   DATE OF PROCEDURE:  06/16/2005  DATE OF DISCHARGE:                                  ECHOCARDIOGRAM   TAPE NUMBER:  [id number]   TAPE COUNT:  [id number]   HISTORY OF PRESENT ILLNESS:  This is a 70 year old woman with a history of  rheumatic fever with a murmur.  Technical quality of this study is very  poor.   M-MODE TRACINGS:  The aorta is 32 mm.   Left atrium is 41 mm.   Septum is 14 mm.   Posterior wall is 14 mm.   Left ventricular diastolic dimension 41 mm.   Left ventricular systolic dimension 26 mm.   A 2D AND DOPPLER IMAGING:  Left ventricle is normal size with normal  systolic function.  There is mild concentric left ventricular hypertrophy.  No wall motion abnormality is seen.  Estimated ejection fraction 60-65%.   Right ventricle is normal size with normal systolic function.   Both atria appear to be  mildly dilated.   Aortic valve is not well seen but appears to have no stenosis or  regurgitation.  The mitral valve is also difficult to see.  There is some  annular calcification.  There is trace regurgitation seen.   The tricuspid valve has trace regurgitation.   There is no pericardial effusion.   Ascending aorta and inferior vena cava are not well seen.      Vida Roller, M.D.  Electronically Signed     JH/MEDQ  D:  06/16/2005  T:  06/17/2005  Job:  [id number]

## 2010-11-20 NOTE — Consult Note (Signed)
[name], [name]               ACCOUNT NO.:  [id number]   MEDICAL RECORD NO.:  [id number]          PATIENT TYPE:  INP   LOCATION:  A206                          FACILITY:  [facility]   PHYSICIAN:  R. Roetta Sessions, M.D. DATE OF BIRTH:  1940-11-07   DATE OF CONSULTATION:  07/14/2006  DATE OF DISCHARGE:                                 CONSULTATION   REASON FOR CONSULTATION:  Abdominal pain, diarrhea.   HISTORY OF PRESENT ILLNESS:  The patient is a 70 year old Caucasian  female with multiple medical problems as outlined below who presents  with a 10 day history of lower abdominal pain and multiple small soft  stools daily.  She complains of tenesmus.  Initially on Christmas Day  she developed vomiting, diarrhea.  Multiple family members also had the  same after a family gathering.  She got better at that point within 3  days.  However, the following week on New Year's Day she began having  lower abdominal pain, tenesmus and passed multiple soft stools daily.  She noticed blood on the toilet tissue, which she has had before, no  melena.  Denies any dysuria, hematuria.  After 7 days she saw Dr.  Nobie Putnam.  She had a CT at [facility] which she states was  normal.  She also reports having negative stool studies.  She was  started on Bentyl, Flagyl and her prednisone for which she is on for  possible temporal arteritis was increased.  She states she continued to  decline over the course of this past week and her abdominal pain has  become more severe, therefore she was told to present to the emergency  department.  She was on antibiotics back on the 1st of December.   In the ED her white count was 17,200, hemoglobin 14.4, MET 7 was normal,  total bilirubin 0.4, alkaline phosphatase 193, AST 31, ALT 121, albumin  3.2, amylase 54, lipase 27, sed rate 47, reportedly had been over 100  recently.  Her urinalysis revealed trace blood, trace leukocyte  esterase, 11-20 WBCs, few bacteria,  few epithelial cells.   MEDICATIONS AT HOME:  1. Prednisone 30 mg daily, recently increased from 20 mg.  2. Valium 5 mg every 4-6 hours as needed.  3. Flagyl 250 mg t.i.d.  4. Bentyl 10 mg q.i.d.  5. Lumigan drops at bedtime.  6. Nexium 40 mg b.i.d.  7. Singulair 10 mg daily.  8. Meclizine 25 mg p.r.n.  9. Advair b.i.d.  10.Albuterol inhaler p.r.n.   ALLERGIES:  1. CODEINE.  2. NSAIDs.  3. IBUPROFEN.  4. SULFA.  5. DOLOBID.   PAST MEDICAL HISTORY:  1. COPD, she was hospitalized December, 2006.  2. She has history of thyroid goiter, underwent radioactive iodine      ablation treatment, does not require any supplementation.  3. Heart murmur.  4. History of rheumatic fever in 1961.  5. She has a history of H. pylori gastritis with an EGD in 1997      revealing mild antral gastritis, she received H. Pylori treatment.  6. History of glaucoma.  Last colonoscopy  was in November, 2006.  She      had a few sigmoid diverticula and a small adenomatous polyp from      the ascending colon and other hyperplastic polyps.  7. She had a lap chole in 1997 for chronic cholecystitis.  8. Right arm surgery.  9. Bladder tack.  10.Hysterectomy.  11.Cervical spine surgery.  12.Bilateral laser eye surgery.  13.She is suspected to have temporal arteritis, began December, 2007,      has not had biopsy yet.  Is on prednisone.  Biopsy delayed due to      current illness.   FAMILY HISTORY:  1. Sister diagnosed in her late 81's with colon cancer.  2. Mother deceased secondary to hepatic carcinoma at age 34.   SOCIAL HISTORY:  She is married for over 44 years.  She has two  children, one deceased secondary to spina bifida.  She is a Futures trader.  She reports a 25 pack year history of tobacco use, currently smoking 1/2  to 1 pack a day.  Denies any drug or alcohol use.   REVIEW OF SYSTEMS:  GI:  See HPI.  CARDIOPULMONARY:  No chest pain,  palpitations, no shortness of breath.  CONSTITUTIONAL:   Appetite is  good, denies any weight loss.  GENITOURINARY:  Denies any dysuria,  hematuria.  Has had some urinary frequency.   PHYSICAL EXAMINATION:  Temp 96, pulse 73, respirations 20, blood  pressure 128/101, weight 154.5, height 62.5 inches.  GENERAL:  A pleasant, elderly Caucasian female in no acute distress.  She does appear uncomfortable, however.  Skin warm and dry, no jaundice.  HEENT:  Sclera nonicteric.  Oropharyngeal mucosa moist and pink.  CHEST:  Lungs clear to auscultation.  CARDIAC:  Regular rate and rhythm, faint murmur noted.  ABDOMEN:  __________ symmetrical, positive bowel sounds.  Abdomen soft.  She has moderate tenderness throughout the entire lower abdomen, more so  on the mid lower abdomen and left lower quadrant.  No rebound tenderness  or guarding, no abdominal bruits or hernias.  No hepatosplenomegaly or  masses.  EXTREMITIES:  No edema.   IMPRESSION:  [name] is a 70 year old lady with a 10 day history of  lower abdominal pain, tenesmus and multiple soft stools daily.  She has  been afebrile.  Symptoms have been progressive.  She reports a negative  CT scan three days ago.  She has mild leukocytosis, although part of  this might be due to the steroids.  Her urinalysis is suggestive of a  urinary tract infection.  Differential diagnosis includes infectious  colitis, less likely mesenteric ischemia or irritable bowel disease.  She also has isolated elevated ALT of unclear etiology and will be  followed.   RECOMMENDATION:  1. IV Cipro and Flagyl.  Continue Solu-Medrol for now.  2. Follow up CT abdomen and pelvis, stool studies as available.  3. Send urine for culture.  4. CBC, LFTs in the morning.  5. Further recommendations to follow.      Tana Coast, P.AJonathon Bellows, M.D.  Electronically Signed    LL/MEDQ  D:  07/14/2006  T:  07/14/2006  Job:  [id number]   cc:   Patrica Duel, M.D.  Fax: [phone]

## 2010-11-20 NOTE — Discharge Summary (Signed)
NAMEVENBA, ZENNER               ACCOUNT NO.:  [id number]   MEDICAL RECORD NO.:  [id number]          PATIENT TYPE:  INP   LOCATION:  A304                          FACILITY:  [facility]   PHYSICIAN:  Patrica Duel, M.D.    DATE OF BIRTH:  05-Oct-1940   DATE OF ADMISSION:  06/12/2005  DATE OF DISCHARGE:  12/20/2006LH                                 DISCHARGE SUMMARY   DISCHARGE DIAGNOSES:  1.  Severe exacerbation chronic obstructive pulmonary disease.  No evidence      of congestive heart failure or pneumonia.  Slow but steady response to      intensive therapy.  2.  History of thyroid nodule.  3.  Rheumatic fever in 1961, resultant heart murmur stable, echo benign.  4.  History of Helicobacter pylori gastritis in 1997.  5.  Status post laparoscopic cholecystectomy in 1999.  6.  History of right arm surgery, bladder tacking and hysterectomy as well      as cervical spine surgery and bilateral laser eye surgery.  7.  Status post colonoscopy approximately a month prior to this admission      for intermittent hematochezia (no recurrence).  Several benign polyps      removed.   HISTORY OF PRESENT ILLNESS:  For details regarding admission, please refer  to the admitting note.  Briefly, this 70 year old female with above history  presented to the office on several occasions with increasingly severe cough  and wheezing.  She failed to respond to broad-spectrum antibiotics and high  dose steroids as well as bronchodilators, etc.  She presented back to the  office the day of admission and given her overall status, we decided to put  her in the hospital for IV steroids and aggressive pulmonary toilet.   The patient also had some atypical chest pain.  EKG showed no changes.   In the emergency department, workup revealed white count 20,000 (steroid  effect), hemoglobin and hematocrit normal as well as platelet count.  Chemistry normal except for glucose of 111.  Cardiac markers negative.  EKG  stable.  Chest x-ray revealed bronchitic changes, no pneumonia.  O2  saturations were in the high 90s.   The patient was admitted with status asthmaticus which had been refractory  to outpatient therapy, considered occult pneumonia.   HOSPITAL COURSE:  The patient was given routine aggressive pulmonary therapy  including IV steroids, broad spectrum antibiotics, pulmonary toilet, etc.  Dr.  Juanetta Gosling was eventually consulted.  He had a Mucomyst for regimen.  She  did show some slow, steady improvement.  She continued to wheeze throughout  the admission until the past 24 hours.  CT scan was obtained which revealed  atelectasis, no pneumonia.  An echocardiogram read by Dr. Dorethea Clan revealed  normal left ventricular size and function, mild bilateral atrial  enlargement, besides annular calcification and trace regurgitation of the  aortic valve and tricuspid valve, it was otherwise negative.   The patient also complained of headache and a CT scan of the brain was  obtained which revealed no significant findings.  She also had CT of her  sinuses which revealed no evidence of sinusitis.  A repeat chest x-ray on  December 16 revealed left base of scar,  Her atelectasis, no significant  change.   The patient also experienced hypoglycemia during the admission which is most  likely from steroid effect.  She is possibly early diabetic.  Most recently,  her blood sugars have been at 190-127.  Prednisone dose will be tapered, and  this will be followed.  O2 saturation is good.  Her lungs are clear, and she  is stable for discharge at this time.  Of note, she has lost approximately  18 pounds over the past several days with mild diuresis.  Her electrolytes  and hemogram remained stable.   DISCHARGE MEDICATIONS:  1.  Pred Dosepak 30 mg 12 day regimen.  2.  Advair 500/50one puff b.i.d.  3.  Levoxyl 50 mg daily.  4.  Saline nasal spray.  5.  Home oxygen and home nebulizers with Xopenex and Atrovent.   6.  She was also begun on Theo-Dur 300 mg b.i.d.   ALLERGIES:  CODEINE, DOLOBID, IBUPROFEN, SULFA.   RECOMMENDATIONS:  I feel antibiotic therapy at this time is unnecessary.      Patrica Duel, M.D.  Electronically Signed     MC/MEDQ  D:  06/23/2005  T:  06/24/2005  Job:  [id number]

## 2010-11-20 NOTE — H&P (Signed)
NAME:  [name], [name] NO.:  [id number]   MEDICAL RECORD NO.:  [id number]          PATIENT TYPE:  AMB   LOCATION:  DAY                           FACILITY:  [facility]   PHYSICIAN:  Dalia Heading, M.D.  DATE OF BIRTH:  [birth year]   DATE OF ADMISSION:  DATE OF DISCHARGE:  LH                              HISTORY & PHYSICAL   Age:  70 years old.   CHIEF COMPLAINT:  Status post Hartmann's procedure for perforated  diverticulitis.   HISTORY OF PRESENT ILLNESS:  The patient is a 70 year old white female,  status post Hartmann's procedure for perforated diverticulitis in  January 2008, who now presents for a colostomy reversal.  She has been  tapering off a prednisone dose pack, due to temporal arteritis concerns,  though the biopsy of her artery was negative.  She had last had a  colonoscopy in 2006, which revealed benign hyperplastic polyps in the  descending the ascending colon.   PAST MEDICAL HISTORY:  Includes anxiety disorder, COPD.   PAST SURGICAL HISTORY:  As noted above, cholecystectomy.   CURRENT MEDICATIONS:  Prednisone 5 mg p.o. every other day, tapering  dose, Nexium, Advair, Xanax.   ALLERGIES:  CODEINE, IBUPROFEN, SULFA, DILAUDID.   REVIEW OF SYSTEMS:  The patient denies any recent chest pain, MI, CVA,  or diabetes mellitus.   PHYSICAL EXAMINATION:  GENERAL:  The patient is a well-developed, well-  nourished white female in no acute distress.  LUNGS:  Clear to auscultation with equal breath sounds bilaterally.  HEART:  Examination reveals regular rate and rhythm without S3, S4, or  murmurs.  ABDOMEN:  Soft.  Colostomy is present in the left lower quadrant.  A  midline incision is present, with some evidence of an incisional hernia,  along its mid-portion.  Colostomy is patent.  RECTAL:  Examination is unremarkable.   IMPRESSION:  Status post Hartmann's procedure for perforated  diverticulitis.   PLAN:  The patient is scheduled to undergo  colostomy reversal on Nov 21, 2006.  The risks and benefits of the procedure including bleeding,  infection, cardiopulmonary difficulties, a possible need for blood  transfusion, the possibility of an incisional hernia were fully  explained to the patient, gave informed consent.      Dalia Heading, M.D.  Electronically Signed     MAJ/MEDQ  D:  10/27/2006  T:  10/27/2006  Job:  [id number]   cc:   Dalia Heading, M.D.  Fax: [phone]   Short Stay - [facility]   Patrica Duel, M.D.  Fax: [phone]

## 2010-11-20 NOTE — Group Therapy Note (Signed)
NAMEQUANTA, [name]               ACCOUNT NO.:  [id number]   MEDICAL RECORD NO.:  [id number]          PATIENT TYPE:  INP   LOCATION:  A203                          FACILITY:  [facility]   PHYSICIAN:  Edward L. Juanetta Gosling, M.D.DATE OF BIRTH:  [birth year]   DATE OF PROCEDURE:  06/17/2005  DATE OF DISCHARGE:                                   PROGRESS NOTE   Patient of Dr. Nobie Putnam.   PROBLEM:  Chronic obstructive pulmonary disease with exacerbation.   SUBJECTIVE:  [name] says she is getting better and that the use of the  Mucomyst has helped her. She is coughing up a lot of sputum. She is still  very short of breath with exertion but she is not wearing her oxygen while  she is up.   OBJECTIVE:  Her exam today shows temperature is 97, pulse is 93,  respirations 20, blood sugar between 160 and 222, blood pressure 158/81, O2  saturation is 94% on 2 L. Her chest still shows fairly marked rhonchi  bilaterally; heart is regular; her abdomen fairly soft. She did have an  echocardiogram said to be poor quality with left ventricular hypertrophy,  ejection fraction 60-65%, could not tell for sure about mitral stenosis but  based on size of atria, etc., it does not appear that she has significant  mitral stenosis at least.   ASSESSMENT:  She is better.   PLAN:  Plan is to continue with her treatments and medications, no changes  today, continue with Mucomyst, etc., and follow.      Edward L. Juanetta Gosling, M.D.  Electronically Signed     ELH/MEDQ  D:  06/17/2005  T:  06/17/2005  Job:  [id number]

## 2010-11-20 NOTE — H&P (Signed)
[name], [name] NO.:  [id number]   MEDICAL RECORD NO.:  [id number]          PATIENT TYPE:  INP   LOCATION:  A206                          FACILITY:  [facility]   PHYSICIAN:  Patrica Duel, M.D.    DATE OF BIRTH:  April 07, 1941   DATE OF ADMISSION:  07/14/2006  DATE OF DISCHARGE:  LH                              HISTORY & PHYSICAL   CHIEF COMPLAINT:  Abdominal pain and diarrhea.   HISTORY OF PRESENT ILLNESS:  This is a 70 year old female with a history  of asthma, gastroesophageal reflux disease, and mild chronic anxiety  disorder.  She is status post cholecystectomy in the remote past.  She  also was treated for glaucoma.   The patient presented to the office approximately two weeks ago  complaining of a right temporal headache.  A CT scan was obtained as  well as a sed rate.  The CT scan was unremarkable and the sed rate was  119.  She was immediately begun on prednisone 30 mg daily.  Several days  later, the patient developed cramping abdominal pain associated with  nausea and small volume stools.  She had no melena, hematemesis,  hematochezia, fever, or chills.  Laboratories were obtained which  revealed a white count of approximately 19,000 with a slight left shift,  normal hemoglobin, and normal chemistries except for mild elevation of  SGPT.  A CT scan was also obtained which was unremarkable.  She has been  screened with stool studies and O&P is negative.  C. diff is currently  pending.  She was treated with Flagyl in the interim, as well.  The  patient has continued experience lower abdominal pain which has become  increasingly severe with frequent low volume mucoid stools.  She has  nausea but denies vomiting.   The aforementioned headache is much improved.  A temporal artery biopsy  was scheduled and the patient postponed the appointment due to what  appeared to be gastroenteritis as it was epidemic in the community at  the time.  We have been unable  to reschedule her temporal artery biopsy  due ongoing GI problems.  She had been maintained on steroids as noted  below.  The patient is admitted with abdominal pain and diarrhea.   CURRENT MEDICATIONS:  Advair 250/50 1 puff b.i.d., prednisone 10 mg  t.i.d., metronidazole 250 t.i.d., dicyclomine 10 q.i.d. p.r.n., diazepam  5 mg q.8h. p.r.n., Singulair 10 daily, Nexium 40 daily.   ALLERGIES:  CODEINE, IBUPROFEN AND SULFA.   PAST HISTORY:  As noted.   REVIEW OF SYSTEMS:  Negative except as mentioned.  She has had no  genitourinary symptoms.  She also denies chest pain, shortness of  breath, neurologic deficits, or visual disturbances.   FAMILY HISTORY:  Noncontributory.   SOCIAL HISTORY:  She smokes half a pack of cigarettes per day.  She  denies use of drugs or alcohol.  She has a supportive husband.   PHYSICAL EXAMINATION:  GENERAL:  This is a very pleasant female who is somewhat uncomfortable.  VITAL SIGNS:  Temperature 98.4, BP 137/99, heart rate is  102,  respirations 24 and unlabored, O2 sat 94%.  HEENT: Normocephalic, atraumatic.  Pupils are equal.  There is some  temporal arterial tenderness on the right which is somewhat improved  from initial evaluation.  Ear, nose and throat are benign.  There is no  scleral icterus.  NECK:  Supple without bruits, thyromegaly, lymphadenopathy, or masses.  LUNGS: Clear to AP.  HEART:  Sounds are somewhat distant with no murmurs, rubs or gallops  noted.  ABDOMEN:  Diffusely tender and mildly distended.  The left and right  lower quadrants are equally tender.  Bowel sounds are slightly  hyperactive.  EXTREMITIES:  No clubbing, cyanosis, edema.  NEUROLOGIC: Nonfocal.   Laboratory repeat is currently pending.   ASSESSMENT:  Ongoing diarrhea and abdominal pain in a 70 year old female  with a high likelihood of having temporal arteritis which has yet to be  biopsy confirmed.  This could be a concomitant illness/vasculitis or  colitis,  etiology yet to be determined. Negative workup to this point  except for leukocytosis which might be related to steroid use.   PLAN:  I will ask GI to consult.  Consider doing colonoscopy in the near  future.  Will repeat stool studies and maintain the patient on IV  steroids pending her progress.      Patrica Duel, M.D.  Electronically Signed     MC/MEDQ  D:  07/14/2006  T:  07/14/2006  Job:  [id number]

## 2010-11-20 NOTE — H&P (Signed)
Darlene, [name]               ACCOUNT NO.:  [id number]   MEDICAL RECORD NO.:  [id number]          PATIENT TYPE:  AMB   LOCATION:  DAY                           FACILITY:  [facility]   PHYSICIAN:  Lionel December, M.D.    DATE OF BIRTH:  24-Oct-1940   DATE OF ADMISSION:  DATE OF DISCHARGE:  LH                                HISTORY & PHYSICAL   PRIMARY CARE PHYSICIAN:  Dr. Nobie Putnam   REFERRING PHYSICIAN:  Dr. Lisette Grinder   CHIEF COMPLAINT:  Intermittent hematochezia.   HISTORY OF PRESENT ILLNESS:  [name] is a 70 year old Caucasian female  who notes over the last 6 months she has had intermittent small volume  hematochezia. She describes bright red rectal bleeding a couple of times a  month mostly on the toilet paper after wiping. She feels that she may have  hemorrhoids. Also on one occasion she noticed pinkish-red blood in the  toilet with some mucus. She describes alternating bowel habits. Generally  she has a bowel movement at least once a day. Occasionally she becomes  constipated. At times she has loose stools up to 5-6 per day as well. She  has a history of IBS type symptoms. She denies any abdominal pain except for  right-sided abdominal pain when she lies supine at night. She does have  intermittent nausea which is transient without any emesis. She denies any  fever or chills. She has chronic GERD and takes Nexium for heart burn and  indigestion which is well-controlled at this time. Occasionally she may take  b.i.d. dosing. She denies any dysphagia or odynophagia. She has a deceased  sister who was diagnosed with colon cancer in her late 27s.   PAST MEDICAL HISTORY:  1.  Asthma.  2.  Thyroid nodule.  3.  Heart murmur.  4.  Rheumatic fever in 1961.  5.  H. pylori gastritis with an EGD in 1997 which revealed mild antero      gastritis. She was status post treatment for H. pylori.  6.  Glaucoma.  7.  Last colonoscopy was January 01, 1997 by Dr. Dionicia Abler. There were multiple  small polyps which were inflammatory and hyperplastic.  8.  She has had a laparoscopic cholecystectomy in 1997 for chronic      cholecystitis.  9.  Right arm surgery.  10. Bladder tack.  11. Hysterectomy.  12. Cervical spine surgery.  13. Bilateral laser eye surgery.   CURRENT MEDICATIONS:  1.  Lumigan eye drops at bedtime.  2.  Nexium 40 mg daily, occasionally b.i.d.  3.  Singulair 10 mg daily.  4.  Meclizine 25 mg p.r.n.  5.  Aspirin 81 mg daily.  6.  Vitamin C 1000 mg daily.  7.  Potassium gluconate 595 mg daily.  8.  Vitamin E 400 international units daily.  9.  Caltrate 600 mg daily.  10. Advair 250/50 mg b.i.d.  11. Albuterol inhaler p.r.n.  12. Liver thyroxine 50 mcg daily.   ALLERGIES:  1.  CODEINE.  2.  DOLOBID.  3.  IBUPROFEN.  4.  SULFA.   FAMILY HISTORY:  Positive for a sister diagnosed in her late 94s with colon  cancer. Also positive for mother deceased secondary to hepatic carcinoma at  age 47.   SOCIAL HISTORY:  Ms. Bills has been married for 44 years. She has two  children; one deceased secondary to spina bifida. She is a Futures trader. She  reports a 25-pack-year tobacco use history. Denies any drug or alcohol use.   REVIEW OF SYSTEMS:  CONSTITUTIONAL:  Weight is steadily increasing, see HPI.  She has good appetite. CARDIOVASCULAR:  Denies any chest pain, palpitations.  PULMONARY:  She denies any shortness of breath, dyspnea, cough or  hemoptysis. GASTROINTESTINAL:  See HPI.   PHYSICAL EXAMINATION:  VITAL SIGNS:  Weight 152 pounds, height 62-1/2  inches, temperature 98.1, blood pressure 120/78, pulse 88.  GENERAL:  [name] is an elderly Caucasian female who is alert and  oriented, pleasant, cooperative and in no acute distress.  HEENT:  Sclerae clear, nonicteric. Conjunctivae pink. Oropharynx pink and  moist without lesions.  NECK:  Supple without any thyromegaly.  CHEST:  Heart regular rate and rhythm with 2/6 murmur noted.  LUNGS:  With  emphysematous changes throughout, in no acute distress.  ABDOMEN:  Protuberant, multiple striae. Slightly distended with positive  bowel sounds x4. Abdomen is soft, mildly tender just right at the umbilicus  without any palpable mass or hepatosplenomegaly. No rebound tenderness, no  guarding.  RECTAL:  Deferred.  EXTREMITIES:  Without edema or clubbing bilaterally.  SKIN:  Pink, warm and dry without rash or jaundice.   IMPRESSION:  [name] is a 70 year old Caucasian female with a greater  than 28-month history of small volume intermittent hematochezia mostly toilet  tissue. She has a family history of colon cancer. Her last colonoscopy was  in 1998 with inflammatory and hyperplastic polyps. At this time we should  proceed further to determine the etiology of her hematochezia, hemorrhoid  disease versus polyps. Of course we also need to rule out colorectal  carcinoma. I suspect that she has IBS symptoms with her alternating stools  as well. She also has chronic GERD well-controlled on PPI.   PLAN:  We will scheduled colonoscopy with Dr. Karilyn Cota in the near future. I  discussed the procedure including risks and benefits of occluded bowel,  interior bleeding, infection, perforation, drug reaction. She agrees with  this plan. Consult to be obtained. She is to hold her aspirin for 3 days  prior to procedure. She is going to need SBE prophylaxis given her heart  murmur.      [name], N.P.      Lionel December, M.D.  Electronically Signed    KC/MEDQ  D:  04/27/2005  T:  04/27/2005  Job:  [id number]   cc:   Langley Gauss, MD  Fax: [phone]   Patrica Duel, M.D.  Fax: [phone]

## 2010-11-20 NOTE — Op Note (Signed)
NAMEIVALEE, STRAUSER NO.:  [id number]   MEDICAL RECORD NO.:  [id number]          PATIENT TYPE:  INP   LOCATION:  A206                          FACILITY:  [facility]   PHYSICIAN:  Dalia Heading, M.D.  DATE OF BIRTH:  07/21/1940   DATE OF PROCEDURE:  07/18/2006  DATE OF DISCHARGE:                               OPERATIVE REPORT   PREOPERATIVE DIAGNOSIS:  Pneumoperitoneum, peritonitis.   POSTOPERATIVE DIAGNOSIS:  Pneumoperitoneum, peritonitis, perforated  sigmoid colon.   PROCEDURE:  Hartmann's procedure.   SURGEON:  Dr. Franky Macho.   ANESTHESIA:  General endotracheal.   INDICATIONS:  The patient is a 70 year old white female who after  undergoing an enema yesterday evening developed acute abdominal pain.  She had been admitted to hospital several days earlier with an episode  of lower abdominal pain which itself had somewhat subsided.  A CT scan  of the abdomen and pelvis was performed which revealed pneumoperitoneum.  The patient now comes to the operating room for exploratory laparotomy  due to a perforated viscus and peritonitis.  Risks and benefits of the  procedure including bleeding, infection, cardiopulmonary difficulties,  and possibility of colostomy were fully explained to the patient, who  gave informed consent.   PROCEDURE NOTE:  The patient was placed in supine position.  After  induction of general endotracheal anesthesia, the abdomen was prepped  and draped in the usual sterile technique with Betadine.  Surgical site  confirmation was performed.   A midline incision was made from the umbilical level to the suprapubic  region.  The peritoneal cavity was entered into without difficulty.  A  significant amount of purulent fluid was removed.  Omentum was noted to  be adhesed into the pelvis.  This was freed away using the harmonic  scalpel.  The patient was noted to have a small perforation of what  appeared to be a diverticulum in the distal  sigmoid colon region.  A TA  stapler was placed at the colorectal juncture and fired.  A linear GIA  stapler was then placed across the midsigmoid colon and fired.  The  mesentery was divided using the harmonic scalpel.  The specimen sent to  pathology for further examination.  Any bleeding was controlled using  large clips.  A 2-0 Prolene was placed at the rectal stump to mark the  location in the future when the colostomy is reversed.  A colostomy was  then brought through the left side of the abdomen.  The abdominal cavity  was copiously irrigated with gentamicin and normal saline.  A #10 flat  Jackson-Pratt drain was placed into the pelvis and brought through a  separate stab wound to the right of the midline incision.  This was  secured at the skin level using a 3-0 nylon interrupted suture.  In  reviewing the findings, it is felt that the patient probably had a  previous perforation which had walled off, but the enema reopened the  perforation.  The fibrinous exudate look like it had been present for  more than the 12 hours.  All operating room  personnel changed their  gloves.  The bowel was returned to the abdominal cavity in orderly  fashion.  The liver was palpated noted be within normal limits.  The  nasogastric tube was noted be its appropriate position in the stomach.  The fascia was reapproximated using a looped 0 Novofil running suture.  The subcutaneous layer was irrigated iodine and skin was closed loosely  with staples and iodine impregnated gauze.  The colostomy was matured  using 3-0 chromic gut interrupted sutures.  Colostomy bag was then  placed.   All tape and needle counts were correct at the end of the procedure.  The patient was extubated in the operating room and went back to  recovery room awake in stable condition.   COMPLICATIONS:  None.   SPECIMEN:  Sigmoid colon.   BLOOD LOSS:  200 mL.   DRAINS:  Jackson-Pratt drain to pelvis.      Dalia Heading, M.D.  Electronically Signed     MAJ/MEDQ  D:  07/18/2006  T:  07/18/2006  Job:  [id number]   cc:   Lionel December, M.D.  [id number]  [place]  [place] [address]   Patrica Duel, M.D.  Fax: [phone]

## 2010-11-20 NOTE — Discharge Summary (Signed)
NAME:  [name], [name] NO.:  [id number]   MEDICAL RECORD NO.:  [id number]          PATIENT TYPE:  INP   LOCATION:  A304                          FACILITY:  [facility]   PHYSICIAN:  Dalia Heading, M.D.  DATE OF BIRTH:  1940-09-24   DATE OF ADMISSION:  07/14/2006  DATE OF DISCHARGE:  01/24/2008LH                               DISCHARGE SUMMARY   HOSPITAL COURSE SUMMARY:  The patient is a 70 year old white female who  presented to the hospital with worsening lower abdominal pain.  She also  had a history of questioned temporal arteritis, but this was not biopsy  confirmed.  Due to her lower abdominal pain, a gastroenterology  consultation was obtained.  A CT scan of the abdomen which had been done  as an outpatient at [facility] was reportedly normal.   GI saw the patient.  The patient underwent flexible sigmoidoscopy on  07/15/2006 due to significant amount of stool in the sigmoid colon  region.  A gastrograph and enema was performed, which revealed  diverticulosis.  She subsequently had multiple enemas to help clear her  constipation.  After the enemas, her abdomen became distended and rigid.  A surgery consultation was obtained and a repeat CT scan was performed  at the same time which revealed a perforated viscus.   She subsequently was taken to the operating room on 07/18/2006 and was  found to have a perforated sigmoid colon from diverticulitis.  A  Hartmann's procedure was performed.  She tolerated the procedure well.   Postoperative course was remarkable for an E-coli urinary tract  infection.  Her diet was advanced without difficulty once her bowel  function returned.  An enterostomal therapy consult was obtained to help  the patient with colostomy care.  The patient is being discharged home  on 07/28/2006 in good and stable condition.   DISCHARGE INSTRUCTIONS:  The patient will follow up Dr. Franky Macho on  08/02/2006.   DISCHARGE MEDICATIONS:   Nystatin cream to the wound three times a day,  Darvocet N 100 one to two tablets p.o. q.4 h p.r.n. pain, peridium 1  tablet p.o. q.8 h p.r.n. bladder spasms, Macrodantin 50 mg p.o. q.i.d.  times 5 days, prednisone 10 mg p.o. daily, Nexium 40 mg p.o. daily,  Advair and Xanax as previously prescribed.   She will be followed by home health.   PRINCIPAL DIAGNOSIS:  1. Perforated sigmoid diverticulitis.  2. Escherichia coli urinary tract infection.  3. History of temporal arteritis.  4. Anxiety disorder.  5. History of chronic obstructive pulmonary disease (COPD).   PRINCIPAL PROCEDURES:  1. Flexible sigmoidoscopy on 07/15/2006.  2. Hartmann's procedure on 07/18/2006.      Dalia Heading, M.D.  Electronically Signed     MAJ/MEDQ  D:  07/28/2006  T:  07/28/2006  Job:  [id number]   cc:   Dalia Heading, M.D.  Fax: [phone]   Patrica Duel, M.D.  Fax: [phone]   Leonidas Romberg  Fax: [phone]

## 2010-11-20 NOTE — Group Therapy Note (Signed)
NAMEROSSIE, SCARFONE               ACCOUNT NO.:  [id number]   MEDICAL RECORD NO.:  [id number]          PATIENT TYPE:  INP   LOCATION:  A203                          FACILITY:  [facility]   PHYSICIAN:  Edward L. Juanetta Gosling, M.D.DATE OF BIRTH:  1940-07-20   DATE OF PROCEDURE:  06/19/2005  DATE OF DISCHARGE:                                   PROGRESS NOTE   SUBJECTIVE:  Patient of Dr. Geanie Logan. Ms. Kief seems to be doing better  in general. She is still short of breath and still very weak. She has no  other new complaints.   OBJECTIVE:  Her physical examination today shows temperature 97, pulse 69,  respirations 21, blood pressure 139/61, blood sugar 165, her O2 saturation  is 96% on 2 L and she is much clearer as far as her chest is concerned.   ASSESSMENT:  She is slowly improving.   PLAN:  Plan is to continue with current medications and treatments and  follow.      Edward L. Juanetta Gosling, M.D.  Electronically Signed     ELH/MEDQ  D:  06/19/2005  T:  06/19/2005  Job:  [id number]

## 2010-11-20 NOTE — Group Therapy Note (Signed)
NAMEALIKA, EPPES               ACCOUNT NO.:  [id number]   MEDICAL RECORD NO.:  [id number]           PATIENT TYPE:  INP   LOCATION:  A203                          FACILITY:  [facility]   PHYSICIAN:  Edward L. Juanetta Gosling, M.D.DATE OF BIRTH:  Nov 29, 1940   DATE OF PROCEDURE:  06/20/2005  DATE OF DISCHARGE:                                   PROGRESS NOTE   SUBJECTIVE:  Ms. Helman says she is feeling better as far as her chest is  concerned, but she has a headache and she is still bringing up some bloody  material from her nose.  Otherwise, everything is going okay.   PHYSICAL EXAMINATION:  GENERAL:  Her physical examination today shows that  she still appears somewhat dyspneic.  VITAL SIGNS:  Temperature is 97, pulse 74, respirations 18, blood sugar 138  this morning and was 255 yesterday evening, blood pressure 114/64, O2  saturations 97% on 2 L.  CHEST:  Her chest is much clearer, but she looks uncomfortable with  headaches.   ASSESSMENT:  Overall she seems to be improving.   PLAN:  Plan is to continue treatments and follow.      Edward L. Juanetta Gosling, M.D.  Electronically Signed     ELH/MEDQ  D:  06/20/2005  T:  06/21/2005  Job:  [id number]

## 2010-11-20 NOTE — Op Note (Signed)
[name], [name]               ACCOUNT NO.:  [id number]   MEDICAL RECORD NO.:  [id number]          PATIENT TYPE:  AMB   LOCATION:  SDS                          FACILITY:  [facility]   PHYSICIAN:  Balinda Quails, M.D.    DATE OF BIRTH:  [birth year]   DATE OF PROCEDURE:  10/04/2006  DATE OF DISCHARGE:                               OPERATIVE REPORT   SURGEON:  Balinda Quails, M.D.   ASSISTANT:  Nurse.   ANESTHETIC:  Local with MAC.   PREOPERATIVE DIAGNOSIS:  Temporal arteritis.   POSTOPERATIVE DIAGNOSIS:   PROCEDURE:  Bilateral temporal artery biopsy.   OPERATIVE PROCEDURE:  The patient was brought the operating room in  stable condition.  Placed in supine position.  The temple shaved  bilaterally.  Bilateral prep carried out with Betadine.   Skin and subcutaneous tissues were instilled with 0.25% Marcaine.  Transverse skin incision was made over the temporal artery at the  superior margin of the ear.  Dissection was carried down through  subcutaneous tissue.  Femoral artery identified.  Approximately 1-cm  temporal artery was excised bilaterally.  Clips used for hemostasis.  Subcutaneous tissue closed with interrupted 3-0 Vicryl suture.  Skin  closed with 4-0 Monocryl.  Dermabond applied.  Sterile dressing applied.   Identical procedure was carried out bilaterally.  Bilateral segments of  temporal artery were sent to pathology.   No apparent complications.  Transferred to the recovery in stable  condition.      Balinda Quails, M.D.  Electronically Signed     PGH/MEDQ  D:  10/04/2006  T:  10/04/2006  Job:  [id number]   cc:   Patrica Duel, M.D.

## 2010-11-20 NOTE — Op Note (Signed)
NAME:  [name], [name] NO.:  [id number]   MEDICAL RECORD NO.:  [id number]          PATIENT TYPE:  INP   LOCATION:  A326                          FACILITY:  [facility]   PHYSICIAN:  Dalia Heading, M.D.  DATE OF BIRTH:  1941/04/28   DATE OF PROCEDURE:  11/21/2006  DATE OF DISCHARGE:                               OPERATIVE REPORT   PREOPERATIVE DIAGNOSIS:  Perforated sigmoid diverticulitis, status post  Hartmann's procedure.   POSTOPERATIVE DIAGNOSIS:  Perforated sigmoid diverticulitis, status post  Hartmann's procedure.   PROCEDURE:  Colostomy reversal, splenic flexure takedown.   SURGEON:  Dalia Heading, M.D.   ANESTHESIA:  General endotracheal.   INDICATIONS:  The patient is a 71 year old white female who in January  of 2008 underwent a Hartmann's procedure for perforated diverticulitis.  She now comes to the operating room for reversal of her colostomy.  The  risks and benefits of the procedure, including bleeding, infection,  cardiopulmonary difficulties, and the possibility of a blood transfusion  were fully explained to the patient who gave informed consent.   PROCEDURE NOTE:  The patient was placed in the supine position.  After  induction of general endotracheal anesthesia, the abdomen and perineum  were prepped and draped using the usual sterile technique.  Surgical  site confirmation was performed.  An Iodoform drape was placed over the  abdomen to cover the colostomy.   An incision was made through the previous lower midline surgical scar.  This was taken down to the peritoneal cavity.  The peritoneal cavity was  entered into without difficulty.  The nasogastric tube was noted be in  its appropriate position in the stomach.  Multiple adhesions of the  small bowel and omentum were noted into the pelvis.  These were freed  away bluntly and sharply without difficulty.  The colorectal stump was  found without difficulty.  It was freed away from its  surrounding  attachments.  An elliptical incision was made around the colostomy.  The  colostomy was then freed away from the fascia and inverted into the  abdominal cavity.  A GIA stapler was placed across the distal end of the  colostomy and fired.  The splenic flexure was then mobilized using the  harmonic scalpel.  Ultimately, a side-to-side colorectal anastomosis was  performed using a GIA 55 stapler.  The colotomy was closed using a TA-60  stapler.  The staple line was bolstered using 3-0 silk sutures.  Omentum  in this region was also brought over the anastomosis and secured using 3-  0 silk sutures.  The pelvis was then copiously irrigated with gentamicin  and normal saline.  Surgicel and Gelfoam were then placed along the  sacrum posterior to the rectum.  No abnormal bleeding was noted at the  end of the procedure.  The former colostomy site fascia was closed using  0 Vicryl interrupted sutures.  The subcutaneous layer was closed using 2-  0 Vicryl interrupted sutures.  The skin was closed using staples.  The  midline fascia was closed using a looped 0 Novofil running suture once  all operating room personnel changed their gloves.  The subcutaneous  layer was irrigated with normal saline, and the skin was closed using  staples.  Betadine ointment and dry sterile dressings were applied.   All tape and needle counts were correct at the end of the procedure.  The patient was extubated in the operating room and went back to  recovery room awake and in stable condition.   COMPLICATIONS:  None.   SPECIMENS:  None.   ESTIMATED BLOOD LOSS:  150 mL.      Dalia Heading, M.D.  Electronically Signed     MAJ/MEDQ  D:  11/21/2006  T:  11/21/2006  Job:  [id number]   cc:   Patrica Duel, M.D.  Fax: [phone]

## 2010-11-20 NOTE — Group Therapy Note (Signed)
Darlene [name], [name]               ACCOUNT NO.:  [id number]   MEDICAL RECORD NO.:  [id number]          PATIENT TYPE:  INP   LOCATION:  A304                          FACILITY:  [facility]   PHYSICIAN:  Edward L. Juanetta Gosling, M.D.DATE OF BIRTH:  02/27/1941   DATE OF PROCEDURE:  06/22/2005  DATE OF DISCHARGE:  06/23/2005                                   PROGRESS NOTE   PROBLEM:  Severe COPD with exacerbation.   SUBJECTIVE:  Darlene [name] says she is feeling a little better. She has no new  complaints. Still very congested but no change.   OBJECTIVE:  VITAL SIGNS:  On her exam shows temperature 97.9, pulse 84,  respirations 20, blood sugar 165, blood pressure 135/72, O2 saturation 97%  on 2 liters.  CHEST:  Clearer. She still has significant rhonchi.   Her white count 16,000 now, hemoglobin is 12. Electrolytes are normal. Her  glucose is 174.   ASSESSMENT:  Assessment is that she is overall better, still pretty sick  with this.   PLAN:  Plan is to continue her medications and treatments and follow.      Edward L. Juanetta Gosling, M.D.  Electronically Signed     ELH/MEDQ  D:  06/22/2005  T:  06/23/2005  Job:  [id number]

## 2010-11-20 NOTE — H&P (Signed)
[place]  Patient:    [name], [name]                      MRN: [id number] Adm. Date:  [id number] Attending:  Tressie Stalker D                         History and Physical  CHIEF COMPLAINT: Neck pain and arm pain.  HISTORY OF PRESENT ILLNESS: The patient is a 70 year old white female who has been having neck and arm pain for approximately one year.  She was initially worked up by Dr. Sherwood Gambler for some nodules in her neck, who then referred her to Dr. Malvin Johns.  Dr. Malvin Johns ordered a needle biopsy of this lesion and the patient was told they were "okay."  Her pain continues and cervical MRI demonstrates significant spondylosis and degenerative disease, with spinal stenosis at C4-5 and C5-6.  She had failed medical management and therefore weighed the risks and benefits and alternatives of surgery, and decided to proceed with surgery.  The patient complains of cervicalgia and pain which radiates to her bilateral arms.  She states her arms occasionally fall asleep. She has noted some weakness and dropping things.  She has had no incontinence.  PAST MEDICAL HISTORY:  1. Endometriosis.  2. Headaches.  3. Migraines.  4. Sinus disease.  5. Glaucoma.  6. Hiatal hernia.  7. Gout.  8. Heart murmur.  9. Rheumatic fever as a child. 10. She was found to have foreign bodies; i.e., needles, in her soft tissues     on prior MRI scan done and they had to be removed.  This was found on part     of a work-up for dizziness in which MRI scan of the brain was planned.  PAST SURGICAL HISTORY:  1. Status post hysterectomy in 1981 secondary to bleeding/endometriosis.  2. Right elbow surgery performed by Katy Fitch. Sypher, Montez Hageman., M.D. in 1990.  3. Glaucoma surgery in 2000.  4. Removal of foreign body in the chest in 1999.  5. Bladder tackup in 1998.  MEDICATIONS PRIOR TO ADMISSION:  1. Meclizine 25 mg p.o. p.r.n.  2. Nexium 40 mg p.o. q.d.  3. Levaquin 500 mg  p.o. q.d.  4. Vivelle patch.  ALLERGIES:  1. SULFA causes swelling and rash.  2. CODEINE causes swelling and rash.  3. IBUPROFEN causes swelling and rash.  4. DOLOBID causes swelling.  FAMILY HISTORY: The patients mother died at the age of 26 of liver cancer. She also had a sister who died at the age of 4 of ovarian and breast cancer. She did not know her father.  SOCIAL HISTORY: The patient is married and has two children.  [place]  She smokes one packs of cigarettes per day approximately x 30 years.  I have advised her to quit.  She denies ethanol and drug use.  She is not employed.  REVIEW OF SYSTEMS: As above.  PHYSICAL EXAMINATION:  GENERAL: The patient is a pleasant, well-developed, well-nourished 70 year old white female, in no apparent distress.  VITAL SIGNS: Height 5 feet 2 inches.  Weight 136 pounds.  HEENT: Normal examination.  NECK: She does have some prominent fullness of the anterior cervical triangle, with some soft tissue palpable masses.  These are what were biopsied and were apparently benign.  She has no tracheal deviation or jugular venous distention.  She has no carotid bruits.  She  has limited cervical range of motion.  Spurling test is positive bilaterally.  Lhermittes sign was not present.  Thorax is symmetrical.  LUNGS: Clear to auscultation.  HEART: Regular rate and rhythm.  ABDOMEN: Soft, nontender.  EXTREMITIES: No obvious deformities.  She has a well-healed surgical incision of her right elbow.  BACK:  Examination normal.  NEUROLOGIC: The patient is alert and oriented x 3.  Cranial nerves 2-12 are grossly intact bilaterally.  Vision is grossly normal bilaterally, with corrective lenses.  Hearing is grossly normal bilaterally.  Motor strength is 5/5 in the bilateral deltoids, biceps, triceps, hand grip, and wrist extensors, interosseous psoas, quadriceps, gastrocnemius, and extensor hallucis longus.   Cerebellar examination is intact to rapid alternating movements of the upper extremities bilaterally.  Sensation examination is normal to light touch.  Deep tendon reflexes are 2/4 in the bilateral biceps, brachial radialis, and 2+/4 in the bilateral triceps, 2+-3/4 in the bilateral quadriceps, 2+/4 in the bilateral gastrocnemius.  She has bilateral flexor and plantar reflexes.  No evidence of clonus.  IMAGING STUDIES: The patient had a cervical MRI performed at [facility] on May 24, 2000 that showed slight kyphotic cervical spine, with significant degenerative disease at C4-5 and C5-6 causes spinal stenosis.  ASSESSMENT/PLAN: C4-5 and C5-6 spondylosis and spinal stenosis as well as degenerative disease, with cervicalgia and cervical myelopathy.  I have discussed the situation with the patient and reviewed the MRI scan with her, pointing out the abnormalities.  She has significant pathology at C4-5 and C5-6 and I discussed with her various treatment options including doing nothing, continuing medical management, and surgery.  I described a C4-5 and C5-6 anterior cervical diskectomy with interbody iliac crest allograft arthrodesis and anterior cervical plating at C4 to C6.  I showed her surgical models and discussed the risks of surgery extensively.  The patient has weighed the risks and benefits and alternatives of surgery and decided to proceed with surgery on June 20, 2000. DD:  06/20/00 TD:  06/20/00 Job: [id number]

## 2010-11-20 NOTE — H&P (Signed)
Darlene, [name] NO.:  [id number]   MEDICAL RECORD NO.:  [id number]          PATIENT TYPE:  INP   LOCATION:  A203                          FACILITY:  [facility]   PHYSICIAN:  Patrica Duel, M.D.    DATE OF BIRTH:  [birth year]   DATE OF ADMISSION:  06/11/2005  DATE OF DISCHARGE:  LH                                HISTORY & PHYSICAL   CHIEF COMPLAINT:  Cough, nasal congestion and wheezing.   HISTORY OF PRESENT ILLNESS:  This is a 70 year old female with a long-  standing history/COPD. She also has a history of thyroid nodule, rheumatic  fever in 1961 with secondary resultant heart murmur. She has been treated  for Helicobacter pylori gastritis in 1997. She is status post laparoscopic  cholecystectomy in 1999, right arm surgery, bladder tacking, hysterectomy,  cervical spine surgery and bilateral laser eye surgery. She underwent  colonoscopy approximately a month ago for intermittent hematochezia which  revealed a few diverticula of the sigmoid colon as well as a small polyp  which was ablated at hepatic flexure. There was also a small polyp in the  ascending colon as well as a sessile polyp in hepatic flexure.   The patient has presented several times to the office with increasingly  severe cough, nasal congestion, and wheezing. She has been treated with  quinolones as well as steroids, bronchodilators, etc. She continued to  experience cough, wheezing and dyspnea. She presented back to the office  where it was decided that a hospital admission for IV steroids and  aggressive pulmonary toilet were indicated.   The patient also had some atypical chest pain. It was partially responsive  to nitroglycerin. EKG showed no changes.   In the emergency department, the patient underwent workup which revealed  white count of 20,000 (question steroid effect), hemoglobin 14.7, hematocrit  44.3, and platelet count of 325,000. Chemistries were normal except for a  glucose of  111. Cardiac markers negative. EKG stable.   Chest x-ray was also obtained which revealed bronchitic changes and no  pneumonia. O2 saturations were in the high 90s.   The patient is admitted with status asthmaticus which has been refractory to  outpatient therapy. Consider occult pneumonia.   There is no history of any headache, neurological deficits, recurring chest  pain, syncope, palpitations, diaphoresis, nausea, vomiting, diarrhea,  melena, hematemesis, hematochezia or genitourinary symptoms.   CURRENT MEDICATIONS:  1.  Levoxyl 50 mcg daily.  2.  Nexium 40 daily.  3.  Aspirin 81 daily.  4.  Advair 250/50 daily.  5.  Prednisone (dose pack of recent).   ALLERGIES:  CODEINE, DOLOBID, IBUPROFEN, SULFA.   PAST HISTORY:  As noted above. Please include hypothyroidism compensated.   REVIEW OF SYSTEMS:  Negative except as mentioned.   FAMILY HISTORY:  Noncontributory.   SOCIAL HISTORY:  Nonsmoker, nondrinker, supportive family.   PHYSICAL EXAMINATION:  GENERAL:  A very pleasant female who is alert and  oriented and in no acute distress at this time.  VITAL SIGNS:  Temperature 98.2, BP 142/72, pulse 74, respirations 24.  HEENT:  Normocephalic, atraumatic.  Pupils are equal. Ears, nose and throat  are benign. Oropharynx is somewhat injected.  NECK:  Supple. No thyromegaly, lymphadenopathy or bruits.  LUNGS:  Reveal diffuse expiratory wheezes and scattered coarse rhonchi,  particularly in the left base.  HEART:  Sounds are somewhat distant. There is a soft systolic murmur which  is difficult to characterize due to breath sounds.  ABDOMEN:  Nontender, nondistended. Bowel sounds are intact.  EXTREMITIES:  No clubbing, cyanosis, or edema.  NEUROLOGICAL:  Nonfocal.   ASSESSMENT:  Refractory asthmatic bronchitis in a 70 year old female with  above history. Consider occult pneumonia.   PLAN:  Admit for broad-spectrum antibiotics, high-dose steroids and  pulmonary toilet. Appropriate  cultures will be obtained. Will follow and  treat expectantly.      Patrica Duel, M.D.  Electronically Signed     MC/MEDQ  D:  06/12/2005  T:  06/12/2005  Job:  [id number]

## 2010-12-17 ENCOUNTER — Ambulatory Visit (INDEPENDENT_AMBULATORY_CARE_PROVIDER_SITE_OTHER): Payer: Medicare Other | Admitting: Otolaryngology

## 2010-12-17 DIAGNOSIS — K2100 Gastro-esophageal reflux disease with esophagitis, without bleeding: Secondary | ICD-10-CM

## 2010-12-17 DIAGNOSIS — J310 Chronic rhinitis: Secondary | ICD-10-CM

## 2010-12-17 DIAGNOSIS — J343 Hypertrophy of nasal turbinates: Secondary | ICD-10-CM

## 2010-12-17 DIAGNOSIS — R490 Dysphonia: Secondary | ICD-10-CM

## 2011-02-23 ENCOUNTER — Other Ambulatory Visit: Payer: Self-pay | Admitting: Neurosurgery

## 2011-02-23 DIAGNOSIS — M549 Dorsalgia, unspecified: Secondary | ICD-10-CM

## 2011-02-24 ENCOUNTER — Ambulatory Visit
Admission: RE | Admit: 2011-02-24 | Discharge: 2011-02-24 | Disposition: A | Discharge status: Home / Self Care | Payer: Medicare Other | Source: Ambulatory Visit | Attending: Neurosurgery | Admitting: Neurosurgery

## 2011-02-24 DIAGNOSIS — M549 Dorsalgia, unspecified: Secondary | ICD-10-CM

## 2011-03-05 ENCOUNTER — Other Ambulatory Visit (HOSPITAL_COMMUNITY): Payer: Self-pay | Admitting: Neurosurgery

## 2011-03-05 DIAGNOSIS — IMO0002 Reserved for concepts with insufficient information to code with codable children: Secondary | ICD-10-CM

## 2011-03-12 ENCOUNTER — Ambulatory Visit (HOSPITAL_COMMUNITY)
Admission: RE | Admit: 2011-03-12 | Discharge: 2011-03-12 | Disposition: A | Discharge status: Home / Self Care | Payer: Medicare Other | Source: Ambulatory Visit | Attending: Neurosurgery | Admitting: Neurosurgery

## 2011-03-12 ENCOUNTER — Other Ambulatory Visit (HOSPITAL_COMMUNITY): Payer: Self-pay | Admitting: Neurosurgery

## 2011-03-12 DIAGNOSIS — IMO0002 Reserved for concepts with insufficient information to code with codable children: Secondary | ICD-10-CM

## 2011-03-12 DIAGNOSIS — R935 Abnormal findings on diagnostic imaging of other abdominal regions, including retroperitoneum: Secondary | ICD-10-CM | POA: Insufficient documentation

## 2011-03-18 ENCOUNTER — Ambulatory Visit (INDEPENDENT_AMBULATORY_CARE_PROVIDER_SITE_OTHER): Payer: Medicare Other | Admitting: Otolaryngology

## 2011-03-18 DIAGNOSIS — J310 Chronic rhinitis: Secondary | ICD-10-CM

## 2011-03-18 DIAGNOSIS — K219 Gastro-esophageal reflux disease without esophagitis: Secondary | ICD-10-CM

## 2011-03-18 DIAGNOSIS — H6120 Impacted cerumen, unspecified ear: Secondary | ICD-10-CM

## 2011-03-18 DIAGNOSIS — R490 Dysphonia: Secondary | ICD-10-CM

## 2011-07-26 ENCOUNTER — Other Ambulatory Visit (HOSPITAL_COMMUNITY): Payer: Self-pay | Admitting: Family Medicine

## 2011-07-26 DIAGNOSIS — Z139 Encounter for screening, unspecified: Secondary | ICD-10-CM

## 2011-07-28 ENCOUNTER — Ambulatory Visit (HOSPITAL_COMMUNITY)
Admission: RE | Admit: 2011-07-28 | Discharge: 2011-07-28 | Disposition: A | Discharge status: Home / Self Care | Payer: Medicare Other | Source: Ambulatory Visit | Attending: Family Medicine | Admitting: Family Medicine

## 2011-07-28 DIAGNOSIS — Z139 Encounter for screening, unspecified: Secondary | ICD-10-CM

## 2011-07-28 DIAGNOSIS — M899 Disorder of bone, unspecified: Secondary | ICD-10-CM | POA: Insufficient documentation

## 2011-07-28 DIAGNOSIS — Z780 Asymptomatic menopausal state: Secondary | ICD-10-CM | POA: Insufficient documentation

## 2012-07-24 ENCOUNTER — Other Ambulatory Visit (HOSPITAL_COMMUNITY): Payer: Self-pay | Admitting: Internal Medicine

## 2012-07-24 ENCOUNTER — Ambulatory Visit (HOSPITAL_COMMUNITY)
Admission: RE | Admit: 2012-07-24 | Discharge: 2012-07-24 | Disposition: A | Discharge status: Home / Self Care | Payer: Medicare Other | Source: Ambulatory Visit | Attending: Internal Medicine | Admitting: Internal Medicine

## 2012-07-24 DIAGNOSIS — M79609 Pain in unspecified limb: Secondary | ICD-10-CM

## 2012-07-24 DIAGNOSIS — I824Y9 Acute embolism and thrombosis of unspecified deep veins of unspecified proximal lower extremity: Secondary | ICD-10-CM | POA: Insufficient documentation

## 2012-07-24 DIAGNOSIS — I82819 Embolism and thrombosis of superficial veins of unspecified lower extremities: Secondary | ICD-10-CM | POA: Insufficient documentation

## 2012-07-24 DIAGNOSIS — F17200 Nicotine dependence, unspecified, uncomplicated: Secondary | ICD-10-CM | POA: Insufficient documentation

## 2012-07-24 DIAGNOSIS — I82A12 Acute embolism and thrombosis of left axillary vein: Secondary | ICD-10-CM

## 2012-07-24 DIAGNOSIS — E119 Type 2 diabetes mellitus without complications: Secondary | ICD-10-CM | POA: Insufficient documentation

## 2012-07-24 DIAGNOSIS — M7989 Other specified soft tissue disorders: Secondary | ICD-10-CM | POA: Insufficient documentation

## 2012-07-24 HISTORY — DX: Acute embolism and thrombosis of left axillary vein: I82.A12

## 2012-08-01 ENCOUNTER — Other Ambulatory Visit: Payer: Self-pay | Admitting: Urology

## 2012-08-01 ENCOUNTER — Ambulatory Visit (INDEPENDENT_AMBULATORY_CARE_PROVIDER_SITE_OTHER): Payer: Medicare Other | Admitting: Urology

## 2012-08-01 DIAGNOSIS — N289 Disorder of kidney and ureter, unspecified: Secondary | ICD-10-CM

## 2012-08-01 DIAGNOSIS — N200 Calculus of kidney: Secondary | ICD-10-CM

## 2012-08-01 DIAGNOSIS — N135 Crossing vessel and stricture of ureter without hydronephrosis: Secondary | ICD-10-CM

## 2012-08-01 DIAGNOSIS — R82998 Other abnormal findings in urine: Secondary | ICD-10-CM

## 2012-08-04 ENCOUNTER — Other Ambulatory Visit (HOSPITAL_COMMUNITY): Payer: Medicare Other

## 2012-08-04 ENCOUNTER — Ambulatory Visit (HOSPITAL_COMMUNITY)
Admission: RE | Admit: 2012-08-04 | Discharge: 2012-08-04 | Disposition: A | Discharge status: Home / Self Care | Payer: Medicare Other | Source: Ambulatory Visit | Attending: Urology | Admitting: Urology

## 2012-08-04 DIAGNOSIS — N289 Disorder of kidney and ureter, unspecified: Secondary | ICD-10-CM | POA: Insufficient documentation

## 2012-08-18 ENCOUNTER — Inpatient Hospital Stay (HOSPITAL_COMMUNITY)
Admission: EM | Admit: 2012-08-18 | Discharge: 2012-08-20 | DRG: 151 | Disposition: A | Discharge status: Home / Self Care | Payer: Medicare Other | Attending: Internal Medicine | Admitting: Internal Medicine

## 2012-08-18 ENCOUNTER — Encounter (HOSPITAL_COMMUNITY): Payer: Self-pay | Admitting: *Deleted

## 2012-08-18 DIAGNOSIS — T45515A Adverse effect of anticoagulants, initial encounter: Secondary | ICD-10-CM | POA: Diagnosis present

## 2012-08-18 DIAGNOSIS — E119 Type 2 diabetes mellitus without complications: Secondary | ICD-10-CM | POA: Diagnosis present

## 2012-08-18 DIAGNOSIS — D6832 Hemorrhagic disorder due to extrinsic circulating anticoagulants: Secondary | ICD-10-CM

## 2012-08-18 DIAGNOSIS — R040 Epistaxis: Principal | ICD-10-CM | POA: Diagnosis present

## 2012-08-18 DIAGNOSIS — E860 Dehydration: Secondary | ICD-10-CM | POA: Diagnosis present

## 2012-08-18 DIAGNOSIS — I10 Essential (primary) hypertension: Secondary | ICD-10-CM | POA: Diagnosis present

## 2012-08-18 DIAGNOSIS — I825Y9 Chronic embolism and thrombosis of unspecified deep veins of unspecified proximal lower extremity: Secondary | ICD-10-CM | POA: Diagnosis present

## 2012-08-18 DIAGNOSIS — R791 Abnormal coagulation profile: Secondary | ICD-10-CM | POA: Diagnosis present

## 2012-08-18 DIAGNOSIS — I82409 Acute embolism and thrombosis of unspecified deep veins of unspecified lower extremity: Secondary | ICD-10-CM

## 2012-08-18 DIAGNOSIS — Z7901 Long term (current) use of anticoagulants: Secondary | ICD-10-CM

## 2012-08-18 DIAGNOSIS — N179 Acute kidney failure, unspecified: Secondary | ICD-10-CM | POA: Diagnosis present

## 2012-08-18 HISTORY — DX: Acute embolism and thrombosis of left axillary vein: I82.A12

## 2012-08-18 HISTORY — DX: Dorsalgia, unspecified: M54.9

## 2012-08-18 MED ORDER — OXYMETAZOLINE HCL 0.05 % NA SOLN
NASAL | Status: AC
  Administered 2012-08-18: 23:00:00
  Filled 2012-08-18: qty 15

## 2012-08-18 NOTE — ED Notes (Signed)
Pt reports nose bleed that she hasn't been able to control.  Pt states this is the third today.  Ice pack applied.

## 2012-08-18 NOTE — ED Provider Notes (Signed)
History     This chart was scribed for [employer]. Colon Branch, MD, MD by Smitty Pluck, ED Scribe. The patient was seen in room APA03/APA03 and the patient's care was started at 11:12 PM.   CSN: [id number]  Arrival date & time 08/18/12  2113      Chief Complaint  Patient presents with  . Epistaxis  . Hypertension  . Dizziness     The history is provided by the patient. No language interpreter was used.   [name] is a 72 y.o. female with hx of HTN, DM, thyroid disease and DVT who presents to the Emergency Department complaining of intermittent, moderate, epistasis onset today. Pt reports that she has had 3 nose bleeds today. She reports symptoms started while she was sitting at table. Pt takes coumadin. Pt denies fever, chills, nausea, vomiting, diarrhea, weakness, cough, SOB and any other pain.   Past Medical History  Diagnosis Date  . Hypertension   . Diabetes mellitus   . Thyroid disease   . DVT of axillary vein, acute left 07/24/12  . Back pain     History reviewed. No pertinent past surgical history.  History reviewed. No pertinent family history.  History  Substance Use Topics  . Smoking status: Never Smoker   . Smokeless tobacco: Not on file  . Alcohol Use: No    OB History   Grav Para Term Preterm Abortions TAB SAB Ect Mult Living                  Review of Systems  Constitutional: Negative for fever.       10 Systems reviewed and are negative for acute change except as noted in the HPI.  HENT: Positive for nosebleeds. Negative for congestion.   Eyes: Negative for discharge and redness.  Respiratory: Negative for cough and shortness of breath.   Cardiovascular: Negative for chest pain.  Gastrointestinal: Negative for vomiting and abdominal pain.  Musculoskeletal: Negative for back pain.  Skin: Negative for rash.  Neurological: Negative for syncope, numbness and headaches.  Psychiatric/Behavioral:       No behavior change.   10 Systems reviewed and  all are negative for acute change except as noted in the HPI.   Allergies  Vioxx; Codeine; Motrin; and Sulfur  Home Medications   Current Outpatient Rx  Name  Route  Sig  Dispense  Refill  . albuterol (PROVENTIL HFA) 108 (90 BASE) MCG/ACT inhaler   Inhalation   Inhale 2 puffs into the lungs every 6 (six) hours as needed for wheezing or shortness of breath.         [name] albuterol (PROVENTIL) (2.5 MG/3ML) 0.083% nebulizer solution   Nebulization   Take 2.5 mg by nebulization every 6 (six) hours as needed for wheezing or shortness of breath.         [name] amitriptyline (ELAVIL) 25 MG tablet   Oral   Take 25 mg by mouth at bedtime.         . bimatoprost (LUMIGAN) 0.01 % SOLN   Both Eyes   Place 1 drop into both eyes at bedtime.         . Calcium Carbonate-Vitamin D (CALCIUM 600 + D PO)   Oral   Take 1 tablet by mouth 2 (two) times daily.         . Cholecalciferol (VITAMIN D3) 5000 UNITS CAPS   Oral   Take 1 capsule by mouth every morning.         [name]  Coenzyme Q10 (CO Q-10) 400 MG CAPS   Oral   Take 1 tablet by mouth 2 (two) times daily.         [name] estradiol (VIVELLE-DOT) 0.075 MG/24HR   Transdermal   Place 1 patch onto the skin 2 (two) times a week.         . furosemide (LASIX) 20 MG tablet   Oral   Take 20 mg by mouth daily as needed (FOR FLUID RETENTION).         [name] losartan (COZAAR) 50 MG tablet   Oral   Take 50 mg by mouth at bedtime.         . metFORMIN (GLUCOPHAGE) 500 MG tablet   Oral   Take 250 mg by mouth 2 (two) times daily.         . Omega-3 Fatty Acids (FISH OIL) 1200 MG CAPS   Oral   Take 1 capsule by mouth 2 (two) times daily.         . potassium chloride (K-DUR) 10 MEQ tablet   Oral   Take 10 mEq by mouth daily. *TO BE TAKEN WITH LASIX (FUROSEMIDE)         . Red Yeast Rice 600 MG CAPS   Oral   Take 1 capsule by mouth daily.         [name] topiramate (TOPAMAX) 25 MG tablet   Oral   Take 25-50 mg by mouth 2 (two) times daily.  TAKE TWO TABLETS IN THE MORNING AND TAKE ONE TABLET IN THE EVENING         . warfarin (COUMADIN) 4 MG tablet   Oral   Take 4 mg by mouth every evening.           BP 179/86  Pulse 90  Temp(Src) 97.5 F (36.4 C) (Oral)  Ht 5\' 2"  (1.575 m)  Wt 146 lb (66.225 kg)  BMI 26.7 kg/m2  SpO2 100%  Physical Exam  Nursing note and vitals reviewed. Constitutional: She is oriented to person, place, and time. She appears well-developed and well-nourished. No distress.  HENT:  Head: Normocephalic and atraumatic.  Eyes: EOM are normal. Pupils are equal, round, and reactive to light.  Neck: Normal range of motion. Neck supple. No tracheal deviation present.  Cardiovascular: Normal rate.   Pulmonary/Chest: Effort normal. No respiratory distress.  Abdominal: Soft. She exhibits no distension.  Musculoskeletal: Normal range of motion.  Neurological: She is alert and oriented to person, place, and time.  Skin: Skin is warm and dry.  Psychiatric: She has a normal mood and affect. Her behavior is normal.    ED Course  EPISTAXIS MANAGEMENT Date/Time: 08/19/2012 12:24 AM Performed by: Annamarie Dawley. Authorized by: Annamarie Dawley Consent: Verbal consent obtained. Consent given by: patient Patient understanding: patient states understanding of the procedure being performed Patient consent: the patient's understanding of the procedure matches consent given Procedure consent: procedure consent matches procedure scheduled Relevant documents: relevant documents present and verified Time out: Immediately prior to procedure a "time out" was called to verify the correct patient, procedure, equipment, support staff and site/side marked as required. Patient sedated: no Repair method: nasal balloon Post-procedure assessment: bleeding decreased Treatment complexity: complex Recurrence: recurrence of recent bleed Patient tolerance: Patient tolerated the procedure well with no immediate  complications.   CC time 30 minutes  Results for orders placed during the hospital encounter of 08/18/12  PROTIME-INR      Result Value Range   Prothrombin Time 41.3 (*)  11.6 - 15.2 seconds   INR 4.71 (*) 0.00 - 1.49   DIAGNOSTIC STUDIES: Oxygen Saturation is 100% on room air, normal by my interpretation.    COORDINATION OF CARE: 11:16 PM Discussed ED treatment with pt and pt agrees.        MDM  Patient with nosebleed that would not stop with pressure, Afrin. Rhino rocket placed. Bleeding continued. INR 4.71. Ordered CBC and BMET. Spoke with Dr. Mayford Knife regarding patient. She will see patient in the ER. Pt stable in ED with no significant deterioration in condition.The patient appears reasonably stabilized for admission considering the current resources, flow, and capabilities available in the ED at this time, and I doubt any other [facility] requiring further screening and/or treatment in the ED prior to admission.  I personally performed the services described in this documentation, which was scribed in my presence. The recorded information has been reviewed and considered.  MDM Reviewed: nursing note and vitals Interpretation: labs           Nicoletta Dress. Colon Branch, MD 08/19/12 [phone]

## 2012-08-18 NOTE — ED Notes (Signed)
Pt states has had 3 nose bleeds today, pt on coumadin, BP 179/86 in triage.

## 2012-08-19 ENCOUNTER — Inpatient Hospital Stay (HOSPITAL_COMMUNITY): Payer: Medicare Other

## 2012-08-19 ENCOUNTER — Encounter (HOSPITAL_COMMUNITY): Payer: Self-pay | Admitting: *Deleted

## 2012-08-19 DIAGNOSIS — R791 Abnormal coagulation profile: Secondary | ICD-10-CM | POA: Diagnosis present

## 2012-08-19 DIAGNOSIS — R040 Epistaxis: Principal | ICD-10-CM

## 2012-08-19 DIAGNOSIS — I82409 Acute embolism and thrombosis of unspecified deep veins of unspecified lower extremity: Secondary | ICD-10-CM | POA: Diagnosis present

## 2012-08-19 DIAGNOSIS — E119 Type 2 diabetes mellitus without complications: Secondary | ICD-10-CM | POA: Diagnosis present

## 2012-08-19 DIAGNOSIS — I10 Essential (primary) hypertension: Secondary | ICD-10-CM | POA: Diagnosis present

## 2012-08-19 LAB — TYPE AND SCREEN: Antibody Screen: NEGATIVE

## 2012-08-19 LAB — CBC
MCHC: 33.5 g/dL (ref 30.0–36.0)
Platelets: 242 10*3/uL (ref 150–400)
RDW: 12.5 % (ref 11.5–15.5)
WBC: 10.4 10*3/uL (ref 4.0–10.5)

## 2012-08-19 LAB — BASIC METABOLIC PANEL
BUN: 31 mg/dL — ABNORMAL HIGH (ref 6–23)
Calcium: 10.7 mg/dL — ABNORMAL HIGH (ref 8.4–10.5)
Calcium: 10.7 mg/dL — ABNORMAL HIGH (ref 8.4–10.5)
Creatinine, Ser: 1.64 mg/dL — ABNORMAL HIGH (ref 0.50–1.10)
GFR calc Af Amer: 40 mL/min — ABNORMAL LOW (ref >90)
GFR calc non Af Amer: 30 mL/min — ABNORMAL LOW (ref >90)
GFR calc non Af Amer: 35 mL/min — ABNORMAL LOW (ref >90)
Glucose, Bld: 125 mg/dL — ABNORMAL HIGH (ref 70–99)
Glucose, Bld: 145 mg/dL — ABNORMAL HIGH (ref 70–99)
Potassium: 4 mEq/L (ref 3.5–5.1)
Sodium: 139 mEq/L (ref 135–145)

## 2012-08-19 LAB — CBC WITH DIFFERENTIAL/PLATELET
Eosinophils Absolute: 0.2 10*3/uL (ref 0.0–0.7)
Eosinophils Relative: 2 % (ref 0–5)
Hemoglobin: 12.2 g/dL (ref 12.0–15.0)
Lymphs Abs: 3.2 10*3/uL (ref 0.7–4.0)
MCH: 30.8 pg (ref 26.0–34.0)
MCHC: 33.4 g/dL (ref 30.0–36.0)
MCV: 92.2 fL (ref 78.0–100.0)
Monocytes Absolute: 0.6 10*3/uL (ref 0.1–1.0)
Monocytes Relative: 7 % (ref 3–12)
RBC: 3.96 MIL/uL (ref 3.87–5.11)

## 2012-08-19 LAB — PROTIME-INR
INR: 4.29 — ABNORMAL HIGH (ref 0.00–1.49)
Prothrombin Time: 38.5 seconds — ABNORMAL HIGH (ref 11.6–15.2)

## 2012-08-19 LAB — GLUCOSE, CAPILLARY
Glucose-Capillary: 139 mg/dL — ABNORMAL HIGH (ref 70–99)
Glucose-Capillary: 90 mg/dL (ref 70–99)
Glucose-Capillary: 61 mg/dL — ABNORMAL LOW (ref 70–99)

## 2012-08-19 LAB — HEMOGLOBIN A1C: Hgb A1c MFr Bld: 6.7 % — ABNORMAL HIGH (ref <5.7)

## 2012-08-19 MED ORDER — IOHEXOL 300 MG/ML  SOLN
80.0000 mL | Freq: Once | INTRAMUSCULAR | Status: AC | PRN
  Administered 2012-08-19: 80 mL via INTRAVENOUS

## 2012-08-19 MED ORDER — ALUM & MAG HYDROXIDE-SIMETH 200-200-20 MG/5ML PO SUSP: 30.0000 mL | Freq: Four times a day (QID) | ORAL | Status: DC | PRN

## 2012-08-19 MED ORDER — POLYETHYLENE GLYCOL 3350 17 G PO PACK
17.0000 g | PACK | Freq: Every day | ORAL | Status: DC
  Administered 2012-08-19 – 2012-08-20 (×2): 17 g via ORAL
  Filled 2012-08-19 (×2): qty 1

## 2012-08-19 MED ORDER — LOSARTAN POTASSIUM 50 MG PO TABS
50.0000 mg | ORAL_TABLET | Freq: Every day | ORAL | Status: DC
  Administered 2012-08-19: 50 mg via ORAL
  Filled 2012-08-19: qty 1

## 2012-08-19 MED ORDER — ONDANSETRON HCL 4 MG/2ML IJ SOLN
4.0000 mg | Freq: Four times a day (QID) | INTRAMUSCULAR | Status: DC | PRN
  Administered 2012-08-19: 4 mg via INTRAVENOUS
  Filled 2012-08-19: qty 2

## 2012-08-19 MED ORDER — ALBUTEROL SULFATE HFA 108 (90 BASE) MCG/ACT IN AERS: 2.0000 | INHALATION_SPRAY | Freq: Four times a day (QID) | RESPIRATORY_TRACT | Status: DC | PRN

## 2012-08-19 MED ORDER — ONDANSETRON HCL 4 MG PO TABS: 4.0000 mg | ORAL_TABLET | Freq: Four times a day (QID) | ORAL | Status: DC | PRN

## 2012-08-19 MED ORDER — ACETAMINOPHEN 650 MG RE SUPP: 650.0000 mg | Freq: Four times a day (QID) | RECTAL | Status: DC | PRN

## 2012-08-19 MED ORDER — BIMATOPROST 0.01 % OP SOLN
1.0000 [drp] | Freq: Every day | OPHTHALMIC | Status: DC
  Administered 2012-08-19: 1 [drp] via OPHTHALMIC
  Filled 2012-08-19: qty 2.5

## 2012-08-19 MED ORDER — IOHEXOL 300 MG/ML  SOLN
50.0000 mL | Freq: Once | INTRAMUSCULAR | Status: AC | PRN
  Administered 2012-08-19: 50 mL via ORAL

## 2012-08-19 MED ORDER — SODIUM CHLORIDE 0.9 % IV SOLN: INTRAVENOUS | Status: AC

## 2012-08-19 MED ORDER — ACETAMINOPHEN 325 MG PO TABS: 650.0000 mg | ORAL_TABLET | Freq: Four times a day (QID) | ORAL | Status: DC | PRN

## 2012-08-19 MED ORDER — TOPIRAMATE 25 MG PO TABS
50.0000 mg | ORAL_TABLET | Freq: Every day | ORAL | Status: DC
  Administered 2012-08-19 – 2012-08-20 (×2): 50 mg via ORAL
  Filled 2012-08-19 (×3): qty 2

## 2012-08-19 MED ORDER — TOPIRAMATE 25 MG PO TABS
25.0000 mg | ORAL_TABLET | Freq: Every day | ORAL | Status: DC
  Administered 2012-08-19: 25 mg via ORAL
  Filled 2012-08-19 (×2): qty 1

## 2012-08-19 MED ORDER — ALBUTEROL SULFATE (5 MG/ML) 0.5% IN NEBU: 2.5000 mg | INHALATION_SOLUTION | RESPIRATORY_TRACT | Status: DC | PRN

## 2012-08-19 MED ORDER — SODIUM CHLORIDE 0.9 % IV SOLN
INTRAVENOUS | Status: AC
  Administered 2012-08-19 (×2): via INTRAVENOUS

## 2012-08-19 MED ORDER — ALPRAZOLAM 0.5 MG PO TABS: 0.5000 mg | ORAL_TABLET | Freq: Three times a day (TID) | ORAL | Status: DC | PRN

## 2012-08-19 MED ORDER — AMOXICILLIN-POT CLAVULANATE 500-125 MG PO TABS
500.0000 mg | ORAL_TABLET | Freq: Two times a day (BID) | ORAL | Status: DC
  Administered 2012-08-19 – 2012-08-20 (×4): 500 mg via ORAL
  Filled 2012-08-19 (×4): qty 1

## 2012-08-19 MED ORDER — PHYTONADIONE 5 MG PO TABS
10.0000 mg | ORAL_TABLET | Freq: Once | ORAL | Status: AC
  Administered 2012-08-19: 10 mg via ORAL
  Filled 2012-08-19: qty 2

## 2012-08-19 MED ORDER — PHYTONADIONE 5 MG PO TABS
ORAL_TABLET | ORAL | Status: AC
  Filled 2012-08-19: qty 2

## 2012-08-19 MED ORDER — HYDROCODONE-ACETAMINOPHEN 5-325 MG PO TABS: 1.0000 | ORAL_TABLET | ORAL | Status: DC | PRN

## 2012-08-19 MED ORDER — OXYMETAZOLINE HCL 0.05 % NA SOLN: 1.0000 | NASAL | Status: DC | PRN

## 2012-08-19 MED ORDER — SENNOSIDES-DOCUSATE SODIUM 8.6-50 MG PO TABS: 1.0000 | ORAL_TABLET | Freq: Every evening | ORAL | Status: DC | PRN

## 2012-08-19 MED ORDER — AMITRIPTYLINE HCL 25 MG PO TABS
25.0000 mg | ORAL_TABLET | Freq: Every day | ORAL | Status: DC
  Administered 2012-08-19 (×2): 25 mg via ORAL
  Filled 2012-08-19 (×2): qty 1

## 2012-08-19 MED ORDER — VITAMIN K1 10 MG/ML IJ SOLN
1.0000 mg | Freq: Once | INTRAVENOUS | Status: AC
  Administered 2012-08-19: 1 mg via INTRAVENOUS
  Filled 2012-08-19: qty 0.1

## 2012-08-19 MED ORDER — ZOLPIDEM TARTRATE 5 MG PO TABS: 5.0000 mg | ORAL_TABLET | Freq: Every evening | ORAL | Status: DC | PRN

## 2012-08-19 MED ORDER — ESTRADIOL 0.1 MG/24HR TD PTWK
0.1000 mg | MEDICATED_PATCH | TRANSDERMAL | Status: DC
  Administered 2012-08-19: 0.1 mg via TRANSDERMAL
  Filled 2012-08-19: qty 1

## 2012-08-19 MED ORDER — OXYMETAZOLINE HCL 0.05 % NA SOLN
NASAL | Status: AC
  Filled 2012-08-19: qty 15

## 2012-08-19 MED ORDER — INSULIN ASPART 100 UNIT/ML ~~LOC~~ SOLN
0.0000 [IU] | Freq: Three times a day (TID) | SUBCUTANEOUS | Status: DC
  Administered 2012-08-19 (×2): 2 [IU] via SUBCUTANEOUS

## 2012-08-19 MED ORDER — PHENOL 1.4 % MT LIQD: 1.0000 | OROMUCOSAL | Status: DC | PRN

## 2012-08-19 MED ORDER — IOHEXOL 350 MG/ML SOLN
100.0000 mL | Freq: Once | INTRAVENOUS | Status: AC | PRN
  Administered 2012-08-19: 100 mL via INTRAVENOUS

## 2012-08-19 NOTE — H&P (Addendum)
[facility] History and Physical  [name]:[id number] DOB: August 27, 1940 DOA: 08/18/2012  Referring physician: Eloise Levels PCP: Colette Ribas, MD  Specialists: none  Chief Complaint: Supratherapeutic INR with Severe Epistaxis  HPI: [name] is a 72 y.o. female with a past medical history significant for hypertension, well controlled type 2 diabetes, a remote massive retrosternal multinodular goiter s/p RAI, colectomy for diverticulitis and remote temporal arteritis. She was recently diagnosed January 20 of 2014 with a lower extremity DVT, left popliteal unprovoked and has been on Coumadin since the time of diagnosis. She has no prior history of hypercoagulability, it has been no trauma to her leg, no recent travel or prolonged period of inactivity. She presented to the  emergency department with a uncontrolled profuse nosebleed and was found to have a supratherapeutic INR of greater than 4. Her Coumadin therapy as managed by her primary care physician Dr. Phillips Odor at [facility], she has had a difficult time getting her INR into a normal range. In the ED she continued to have area difficult to control bleeding. A Merocel pack was placed into the right nare and admission was requested for observation given her acute bleed and supratherapeutic INR. She does complain of feeling slightly short of breath, weak, and complained of diaphoresis.  Review of Systems: The patient denies anorexia, fever, weight loss,, vision loss, decreased hearing, hoarseness, chest pain, syncope,  peripheral edema, balance deficits, hemoptysis, abdominal pain, melena, hematochezia, severe indigestion/heartburn, hematuria, incontinence, genital sores, muscle weakness, suspicious skin lesions, transient blindness, difficulty walking, depression, unusual weight change, enlarged lymph nodes, angioedema, and breast masses.   Past Medical History  Diagnosis Date  . Hypertension   . Diabetes  mellitus   . Thyroid disease   . DVT of axillary vein, acute left 07/24/12  . Back pain    Past Surgical History  Procedure Laterality Date  . Other surgical history      colostomy, colostomy reversal, surgical hernia repair, arm surgery, neck surgery  . Abdominal hysterectomy     Social History:  reports that she has never smoked. She does not have any smokeless tobacco history on file. She reports that she does not drink alcohol or use illicit drugs.   Allergies  Allergen Reactions  . Vioxx (Rofecoxib) Shortness Of Breath  . Codeine Rash  . Motrin (Ibuprofen) Rash  . Sulfur Rash    History reviewed. No pertinent family history. No family history of bleeding or clotting disorders.  Prior to Admission medications   Medication Sig Start Date End Date Taking? Authorizing Provider  albuterol (PROVENTIL HFA) 108 (90 BASE) MCG/ACT inhaler Inhale 2 puffs into the lungs every 6 (six) hours as needed for wheezing or shortness of breath.   Yes Historical Provider, MD  albuterol (PROVENTIL) (2.5 MG/3ML) 0.083% nebulizer solution Take 2.5 mg by nebulization every 6 (six) hours as needed for wheezing or shortness of breath.   Yes Historical Provider, MD  amitriptyline (ELAVIL) 25 MG tablet Take 25 mg by mouth at bedtime.   Yes Historical Provider, MD  bimatoprost (LUMIGAN) 0.01 % SOLN Place 1 drop into both eyes at bedtime.   Yes Historical Provider, MD  Calcium Carbonate-Vitamin D (CALCIUM 600 + D PO) Take 1 tablet by mouth 2 (two) times daily.   Yes Historical Provider, MD  Cholecalciferol (VITAMIN D3) 5000 UNITS CAPS Take 1 capsule by mouth every morning.   Yes Historical Provider, MD  Coenzyme Q10 (CO Q-10) 400 MG CAPS Take 1 tablet  by mouth 2 (two) times daily.   Yes Historical Provider, MD  estradiol (VIVELLE-DOT) 0.075 MG/24HR Place 1 patch onto the skin 2 (two) times a week.   Yes Historical Provider, MD  furosemide (LASIX) 20 MG tablet Take 20 mg by mouth daily as needed (FOR FLUID  RETENTION).   Yes Historical Provider, MD  losartan (COZAAR) 50 MG tablet Take 50 mg by mouth at bedtime.   Yes Historical Provider, MD  metFORMIN (GLUCOPHAGE) 500 MG tablet Take 250 mg by mouth 2 (two) times daily.   Yes Historical Provider, MD  Omega-3 Fatty Acids (FISH OIL) 1200 MG CAPS Take 1 capsule by mouth 2 (two) times daily.   Yes Historical Provider, MD  polyethylene glycol (MIRALAX / GLYCOLAX) packet Take 17 g by mouth daily.   Yes Historical Provider, MD  potassium chloride (K-DUR) 10 MEQ tablet Take 10 mEq by mouth daily. *TO BE TAKEN WITH LASIX (FUROSEMIDE)   Yes Historical Provider, MD  Red Yeast Rice 600 MG CAPS Take 1 capsule by mouth daily.   Yes Historical Provider, MD  topiramate (TOPAMAX) 25 MG tablet Take 25-50 mg by mouth 2 (two) times daily. TAKE TWO TABLETS IN THE MORNING AND TAKE ONE TABLET IN THE EVENING   Yes Historical Provider, MD  warfarin (COUMADIN) 4 MG tablet Take 4 mg by mouth every evening.   Yes Historical Provider, MD   Physical Exam: Filed Vitals:   08/18/12 2205 08/19/12 0122 08/19/12 0229 08/19/12 0234  BP: 179/86 137/80 148/80   Pulse: 90 92 107   Temp: 97.5 F (36.4 C) 98.1 F (36.7 C)  97.2 F (36.2 C)  TempSrc: Oral Oral Oral Axillary  Resp:  24    Height: 5\' 2"  (1.575 m)  5\' 3"  (1.6 m)   Weight: 66.225 kg (146 lb)  69.854 kg (154 lb)   SpO2: 100% 99% 100%      General:  Arrived at bedside after patient had been transferred from the ED to the floor, she was acutely distressed gagging and spitting out very large blood clots from teh back of her throat, she was having difficulty breathing and managing the profuse nature of the bleeding. She appeared pale and was clammy.   Eyes: normal, conjunctive mildly inflammed  ENT: profuse bleeding from right nare- SPECULUM EXAM performed with nasal tray, moderately large superficial anterior septal vessel appears to be the source.  Neck: normal  Cardiovascular: Tachycardic, no mrg  Respiratory: no  rhonchi or wheezing  Abdomen: soft non-tender  Skin: no rashes  Musculoskeletal: normal, minimal swelling in her left leg, bilateral; pulses normal  Psychiatric: appropriate  Neurologic: non-focal  Labs on Admission:  Basic Metabolic Panel:  Recent Labs Lab 08/18/12 2307  NA 139  K 4.0  CL 100  CO2 29  GLUCOSE 125*  BUN 31*  CREATININE 1.64*  CALCIUM 10.7*   Liver Function Tests: No results found for this basename: AST, ALT, ALKPHOS, BILITOT, PROT, ALBUMIN,  in the last 168 hours No results found for this basename: LIPASE, AMYLASE,  in the last 168 hours No results found for this basename: AMMONIA,  in the last 168 hours CBC:  Recent Labs Lab 08/18/12 2307  WBC 8.4  NEUTROABS 4.5  HGB 12.2  HCT 36.5  MCV 92.2  PLT 246   CBG:  Recent Labs Lab 08/19/12 0332  GLUCAP 163*    Assessment/Plan Principal Problem:   Supratherapeutic INR Active Problems:   Epistaxis   DVT (deep venous thrombosis)  DM (diabetes mellitus)   HTN (hypertension)   Ms. Ayotte is a 72 year old woman with well-controlled diabetes and hypertension who has recently been diagnosed with a DVT in her left lower leg and started on Coumadin therapy. She came into the emergency room with a profuse nosebleed and a Merocel packing was placed and she was admitted to the floor for observation given her high INR. When she arrived to the floor her nose continued to bleed despite a Merocel nasal packing and I was paged to evaluate her immediately for profuse nasal bleeding. When I arrived at the bedside multiple nurses were attempting to assist the patient with her nosebleed which was causing her a significant amount of distress including a near syncopal episode where she became pale and diaphoretic with the sensation that she may pass out, this was a near emergency situation which the extent of her nasal bleed was beginning to affect her ability to protect her airway as she was gagging and choking on  large golf ball size blood clots topping down into her throat from her nose.  The Merocel packing was removed from her right nasal passage and her nose was irrigated Afrin to achieve vasoconstriction, and I was able to stop the bleeding long enough to identify an anterior septal vessel as the source, I applied a 1X1 piece of gelfoam soaked in afrin for a few minutes with complete resolution of the bleeding, I packed her nose with a 2X2 guaze soaked in afrin and will leave in place for the next few hours. Ideally she need cauterization of that vessel but resources were limited on the floor. Suspect nosebleed source s from colder temps, dry air and possible sinusitis/rhinitis.  Plan:  Manage nose bleed with afrin, packing and pressure  Given significant bleeding and near airway compromise will reverse her INR with one dose of oral Vitamin K.  Repeat INR  DVT unprovoked in fairly healthy 72 yo is concerning for underlying health problem like malignancy, she is short of breath which may be from anemia but probably worth obtaining a CT of her chest.  She has some new renal insufficiency Scr 1.6, will hydrate and repeat, likely from mild dehydration and over diuresis since she is on Lasix at home.  Empiric Agmentin for sinusitis and nasal packing infection prevention.  Saline for nasal moisture  Outpatient ENT referral   Code Status: Full Code presumed Family Communication: Plan of Care discussed with patient at bedside Disposition Plan: Plan is to reverse her INR and to make sure she does not have recurrent nose bleed, she will need outpatient evaluation by ENT since she is at high risk for rebleed.  Time spent: 70 minutes  [facility] Pager [phone]  If 7PM-7AM, please contact night-coverage [email or web address] Password [facility] 08/19/2012, 4:33 AM

## 2012-08-19 NOTE — ED Notes (Signed)
EDP has placed rhinorocket.  Bleeding continues at present time. EDP aware.

## 2012-08-19 NOTE — Progress Notes (Signed)
A 72 year old lady was admitted with significant epistaxis. Unfortunately, she did not require blood transfusion yet. She was given oral vitamin K. Her INR this morning is not appreciably improved. She also had a left leg DVT in January 2014, there is no clear etiology for this except she has been on estrogen replacement therapy. She has renal insufficiency. She is currently on IV fluids. Her calcium was borderline elevated.  Plan: 1. Basic metabolic panel stat. 2. CT scan of the abdomen and pelvis today, hopefully with contrast if renal function allows. 3. Vitamin K 1 mg IV now. I'm concerned she may rebleed again.

## 2012-08-19 NOTE — Progress Notes (Signed)
Pt continues to complain of some dizziness, no other complaints at this time. BP is 109/70. Nursing will continue to monitor. Sheryn Bison

## 2012-08-20 ENCOUNTER — Inpatient Hospital Stay (HOSPITAL_COMMUNITY): Payer: Medicare Other

## 2012-08-20 DIAGNOSIS — R791 Abnormal coagulation profile: Secondary | ICD-10-CM

## 2012-08-20 DIAGNOSIS — I10 Essential (primary) hypertension: Secondary | ICD-10-CM

## 2012-08-20 DIAGNOSIS — E119 Type 2 diabetes mellitus without complications: Secondary | ICD-10-CM

## 2012-08-20 DIAGNOSIS — I82409 Acute embolism and thrombosis of unspecified deep veins of unspecified lower extremity: Secondary | ICD-10-CM

## 2012-08-20 LAB — BASIC METABOLIC PANEL
BUN: 19 mg/dL (ref 6–23)
CO2: 24 mEq/L (ref 19–32)
Chloride: 108 mEq/L (ref 96–112)
Creatinine, Ser: 1.13 mg/dL — ABNORMAL HIGH (ref 0.50–1.10)
Glucose, Bld: 109 mg/dL — ABNORMAL HIGH (ref 70–99)
Potassium: 3.6 mEq/L (ref 3.5–5.1)

## 2012-08-20 LAB — PROTIME-INR
INR: 1.38 (ref 0.00–1.49)
Prothrombin Time: 16.6 seconds — ABNORMAL HIGH (ref 11.6–15.2)

## 2012-08-20 LAB — GLUCOSE, CAPILLARY: Glucose-Capillary: 92 mg/dL (ref 70–99)

## 2012-08-20 MED ORDER — AMOXICILLIN-POT CLAVULANATE 500-125 MG PO TABS: 500.0000 mg | ORAL_TABLET | Freq: Two times a day (BID) | ORAL | Status: DC

## 2012-08-20 MED ORDER — OXYMETAZOLINE HCL 0.05 % NA SOLN: 1.0000 | NASAL | Status: DC | PRN

## 2012-08-20 NOTE — Progress Notes (Signed)
Pt and her husband verbalize understanding of d/c instructions, prescriptions, & follow up appts. Also, sent copies of imaging results with the pt. I informed her that Dr. Karilyn Cota would like for her to share these with her PCP on her follow up visit. Pt verbalizes understanding of this information. IV d/c. I escorted pt to short stay exit via wheelchair. pts husband is taking her home. [name]

## 2012-08-20 NOTE — Progress Notes (Signed)
Gauze removed from pts nostril. Will monitor for bleeding for a couple of hours per Dr. Patty Sermons request, then d/c pt. Sheryn Bison

## 2012-08-20 NOTE — Progress Notes (Addendum)
Pt c/o dizziness, especially with any movement. VS checked, BP is 109/37, all other VS are charted and are WNL. Dr. Karilyn Cota is on the floor, notified him of this, he will see patient soon, no new orders at this time. [name]

## 2012-08-20 NOTE — Discharge Summary (Signed)
Physician Discharge Summary  [name] VWU:[id number] DOB: 1941/03/25 DOA: 08/18/2012  PCP: Darlene Ribas, MD  Admit date: 08/18/2012 Discharge date: 08/20/2012  Time spent: Greater than 30 minutes  Recommendations for Outpatient Follow-up:  1. Follow with primary care physician to check INR for Coumadin and also further investigate possible adnexal cyst/mass.   Discharge Diagnoses:  1. Significant epistaxis with supratherapeutic INR. Bleeding controlled locally. Coumadin reversed with vitamin K. 2. Left leg DVT, on  anticoagulation. Coumadin can be restarted. 3. Type 2 diabetes mellitus. 4. Hypertension.   Discharge Condition: Stable and improved.  Diet recommendation: Carbohydrate modified diet.  Filed Weights   08/18/12 2205 08/19/12 0229  Weight: 66.225 kg (146 lb) 69.854 kg (154 lb)    History of present illness:  This very pleasant 72 year old lady presented to the hospital with symptoms severe epistaxis. Please see initial history as outlined below: HPI: [name] is a 72 y.o. female with a past medical history significant for hypertension, well controlled type 2 diabetes, a remote massive retrosternal multinodular goiter s/p RAI, colectomy for diverticulitis and remote temporal arteritis. She was recently diagnosed January 20 of 2014 with a lower extremity DVT, left popliteal unprovoked and has been on Coumadin since the time of diagnosis. She has no prior history of hypercoagulability, it has been no trauma to her leg, no recent travel or prolonged period of inactivity. She presented to the emergency department with a uncontrolled profuse nosebleed and was found to have a supratherapeutic INR of greater than 4. Her Coumadin therapy as managed by her primary care physician Dr. Phillips [name] at [facility], she has had a difficult time getting her INR into a normal range. In the ED she continued to have area difficult to control bleeding. A Merocel pack was  placed into the right nare and admission was requested for observation given her acute bleed and supratherapeutic INR. She does complain of feeling slightly short of breath, weak, and complained of diaphoresis.  Hospital Course:  Patient was admitted and her epistaxis was controlled locally with nasal packing and Afrin. Her anticoagulation was reversed with vitamin K IV. Since this time there's been no further nasal bleeding. She's been hemoglobin was stable. When she also was admitted she was in acute renal failure secondary to dehydration. This is improved with IV fluids. She feels much improved overall. Her nasal packing will be taken out and she will be monitored on the floor for the next couple of hours. If there is no further bleeding, she is stable for discharge. She will need followup with her primary care physician and may need outpatient ENT referral. Her Coumadin can be restarted.  Procedures:  None.  Consultations:  None.  Discharge Exam: Filed Vitals:   08/19/12 1450 08/19/12 2039 08/20/12 0640 08/20/12 0808  BP: 121/79 113/27 123/80 109/37  Pulse: 68 68 67 66  Temp: 97.5 F (36.4 C)  97.1 F (36.2 C)   TempSrc:   Oral   Resp: 20 20 20 16   Height:      Weight:      SpO2: 97% 98% 95% 100%    General: She looks systemically well. Cardiovascular: Heart sounds are present without gallop rhythm or murmurs. Respiratory: Lung fields are clear. There is no further nasal bleeding.  Discharge Instructions  Discharge Orders   Future Orders Complete By Expires     Diet - low sodium heart healthy  As directed     Increase activity slowly  As directed  Medication List    TAKE these medications       albuterol (2.5 MG/3ML) 0.083% nebulizer solution  Commonly known as:  PROVENTIL  Take 2.5 mg by nebulization every 6 (six) hours as needed for wheezing or shortness of breath.     PROVENTIL HFA 108 (90 BASE) MCG/ACT inhaler  Generic drug:  albuterol  Inhale 2  puffs into the lungs every 6 (six) hours as needed for wheezing or shortness of breath.     amitriptyline 25 MG tablet  Commonly known as:  ELAVIL  Take 25 mg by mouth at bedtime.     amoxicillin-clavulanate 500-125 MG per tablet  Commonly known as:  AUGMENTIN  Take 1 tablet (500 mg total) by mouth 2 (two) times daily.     bimatoprost 0.01 % Soln  Commonly known as:  LUMIGAN  Place 1 drop into both eyes at bedtime.     CALCIUM 600 + D PO  Take 1 tablet by mouth 2 (two) times daily.     Co Q-10 400 MG Caps  Take 1 tablet by mouth 2 (two) times daily.     estradiol 0.075 MG/24HR  Commonly known as:  VIVELLE-DOT  Place 1 patch onto the skin 2 (two) times a week.     Fish Oil 1200 MG Caps  Take 1 capsule by mouth 2 (two) times daily.     furosemide 20 MG tablet  Commonly known as:  LASIX  Take 20 mg by mouth daily as needed (FOR FLUID RETENTION).     losartan 50 MG tablet  Commonly known as:  COZAAR  Take 50 mg by mouth at bedtime.     metFORMIN 500 MG tablet  Commonly known as:  GLUCOPHAGE  Take 250 mg by mouth 2 (two) times daily.     oxymetazoline 0.05 % nasal spray  Commonly known as:  AFRIN  Place 1 spray into the nose as needed for congestion.     polyethylene glycol packet  Commonly known as:  MIRALAX / GLYCOLAX  Take 17 g by mouth daily.     potassium chloride 10 MEQ tablet  Commonly known as:  K-DUR  Take 10 mEq by mouth daily. *TO BE TAKEN WITH LASIX (FUROSEMIDE)     Red Yeast Rice 600 MG Caps  Take 1 capsule by mouth daily.     topiramate 25 MG tablet  Commonly known as:  TOPAMAX  Take 25-50 mg by mouth 2 (two) times daily. TAKE TWO TABLETS IN THE MORNING AND TAKE ONE TABLET IN THE EVENING     Vitamin D3 5000 UNITS Caps  Take 1 capsule by mouth every morning.     warfarin 4 MG tablet  Commonly known as:  COUMADIN  Take 4 mg by mouth every evening.          The results of significant diagnostics from this hospitalization (including imaging,  microbiology, ancillary and laboratory) are listed below for reference.    Significant Diagnostic Studies: Ct Angio Chest Pe W/cm &/or Wo Cm  08/19/2012  *RADIOLOGY REPORT*  Clinical Data: Chest pain and shortness of breath; dyspnea and tachycardia.  History of DVT.  Patient on Coumadin.  CT ANGIOGRAPHY CHEST  Technique:  Multidetector CT imaging of the chest using the standard protocol during bolus administration of intravenous contrast. Multiplanar reconstructed images including MIPs were obtained and reviewed to evaluate the vascular anatomy.  Contrast: OMNIPAQUE IOHEXOL 350 MG/ML SOLN  Comparison: CTA of the chest performed 06/15/2005  Findings: There  is no evidence of pulmonary embolus.  Minimal atelectasis is noted at the left lingula.  The lungs are otherwise clear.  There is no evidence of significant focal consolidation, pleural effusion or pneumothorax.  No masses are identified; no abnormal focal contrast enhancement is seen.  Mediastinal nodes remain normal in size.  No pericardial effusion is identified.  The great vessels are grossly unremarkable in appearance.  Scattered intramural thrombus is noted along the descending thoracic aorta.  A 5 mm nodule at the distal esophagus is stable in appearance from 2006.  No axillary lymphadenopathy is seen.  The thyroid gland is unremarkable in appearance.  The visualized portions of the liver and spleen are unremarkable. The visualized portions of the pancreas, stomach, adrenal glands and kidneys are within normal limits.  The patient is status post cholecystectomy, with clips noted at the gallbladder fossa.  No acute osseous abnormalities are seen.  IMPRESSION:  1.  No evidence of pulmonary embolus. 2.  Minimal atelectasis at the left lingula; lungs otherwise clear. 3.  Mild calcific atherosclerotic disease along the thoracic aorta.   Original Report Authenticated By: Tonia Ghent, M.D.    US Transvaginal Non-ob  08/20/2012  *RADIOLOGY REPORT*   Clinical Data: Cystic mass in the pelvis seen on CT.  TRANSABDOMINAL AND TRANSVAGINAL ULTRASOUND OF PELVIS  Technique:  Both transabdominal and transvaginal ultrasound examinations of the pelvis were performed including evaluation of the uterus, ovaries, adnexal regions, and pelvic cul-de-sac.  Comparison: CT 08/19/2012  Findings:  Uterus: Prior hysterectomy.  Endometrium: Prior hysterectomy.  Right Ovary: 1.7 x 0.9 x 0.7 cm. Normal size and echotexture.  No adnexal masses.  The  Left Ovary: 3.3 x 1.7 x 3.0 cm.  There is a cyst within the left ovary measured 2.5 x 1.7 x 1.6 cm.  Other Findings:  Adjacent to the left ovarian cyst and crossing the midline superior to the bladder is a large complex cystic mass measuring 5.4 x 7.6 x 1.9 cm.  This appears separate from both ovaries, but exact origin cannot be determined and an ovarian source cannot be excluded.  This has a thick internal septations and internal debris.  This conceivably could represent a complex hydrosalpinx.  Cystic ovarian neoplasm cannot be excluded.  IMPRESSION: Large complex cystic mass crossing the midline superior to the bladder measuring 7.6 cm.  While this could reflect a complex hydrosalpinx, I cannot exclude cystic ovarian neoplasm.  Recommend gynecologic consultation.  MRI may be helpful in further characterizing this cystic mass.  Separate 2.5 cm cyst within the left ovary.   Original Report Authenticated By: Charlett Nose, M.D.    US Pelvis Complete  08/20/2012  *RADIOLOGY REPORT*  Clinical Data: Cystic mass in the pelvis seen on CT.  TRANSABDOMINAL AND TRANSVAGINAL ULTRASOUND OF PELVIS  Technique:  Both transabdominal and transvaginal ultrasound examinations of the pelvis were performed including evaluation of the uterus, ovaries, adnexal regions, and pelvic cul-de-sac.  Comparison: CT 08/19/2012  Findings:  Uterus: Prior hysterectomy.  Endometrium: Prior hysterectomy.  Right Ovary: 1.7 x 0.9 x 0.7 cm. Normal size and echotexture.  No  adnexal masses.  The  Left Ovary: 3.3 x 1.7 x 3.0 cm.  There is a cyst within the left ovary measured 2.5 x 1.7 x 1.6 cm.  Other Findings:  Adjacent to the left ovarian cyst and crossing the midline superior to the bladder is a large complex cystic mass measuring 5.4 x 7.6 x 1.9 cm.  This appears separate from both ovaries, but exact  origin cannot be determined and an ovarian source cannot be excluded.  This has a thick internal septations and internal debris.  This conceivably could represent a complex hydrosalpinx.  Cystic ovarian neoplasm cannot be excluded.  IMPRESSION: Large complex cystic mass crossing the midline superior to the bladder measuring 7.6 cm.  While this could reflect a complex hydrosalpinx, I cannot exclude cystic ovarian neoplasm.  Recommend gynecologic consultation.  MRI may be helpful in further characterizing this cystic mass.  Separate 2.5 cm cyst within the left ovary.   Original Report Authenticated By: Charlett Nose, M.D.    Ct Abdomen Pelvis W Contrast  08/19/2012  *RADIOLOGY REPORT*  Clinical Data: Left leg DVT.  Questioning underlying pelvic or abdominal malignancy.  CT ABDOMEN AND PELVIS WITH CONTRAST  Technique:  Multidetector CT imaging of the abdomen and pelvis was performed following the standard protocol during bolus administration of intravenous contrast.  Contrast:  80mL OMNIPAQUE IOHEXOL 300 MG/ML  SOLN  Comparison: 07/18/2006  Findings: Heart is normal size.  Lung bases clear.  No effusions.  Prior cholecystectomy.  Liver, spleen, pancreas, adrenals and kidneys are unremarkable.  Large stool burden throughout the colon. Small bowel is decompressed.  Appendix is visualized and is normal.  Prior hysterectomy.  There is a fluid collection or cystic mass sitting along the superior aspect of the urinary bladder.  This is best visualized on the coronal and sagittal images.  On axial image 66, this measures 8.8 x 4.9 cm.  It is unknown if this represents a cystic adnexal mass or a  fluid collection.  I would recommend pelvic ultrasound to further assess.  This does not cause mass effect on the iliac veins and therefore not felt to be the cause of the left leg DVT.  No free fluid, free air or adenopathy.  Aorta and iliac vessels are heavily calcified, non-aneurysmal.  Sclerotic focus within the right femoral neck and left side of the sacrum, nonspecific, favor bone islands.  IMPRESSION: Cystic area or fluid collection along the superior aspect of the bladder, difficult to separate from the bladder on the axial images but better seen on coronal and sagittal reconstructed images.  This could represent a cystic adnexal mass or focal fluid collection. Recommend further evaluation with pelvic ultrasound.  Large stool burden throughout the colon.   Original Report Authenticated By: Charlett Nose, M.D.    US Renal  08/04/2012  *RADIOLOGY REPORT*  Clinical Data: Renal insufficiency.  RENAL/URINARY TRACT ULTRASOUND COMPLETE  Comparison:  CT scan of the abdomen dated 07/18/2006  Findings:  Right Kidney:  Normal.  9.2 cm in length.  Left Kidney:  Normal.  8.7 cm in length.  Bladder:  Normal.  Prevoid volume 226 ml.  Postvoid volume 30 ml.  IMPRESSION: Normal renal ultrasound.   Original Report Authenticated By: Francene Boyers, M.D.    US Venous Img Lower Unilateral Left  07/24/2012  *RADIOLOGY REPORT*  Clinical Data: Left lower extremity calf pain and edema, history of diabetes and smoking, evaluate for DVT  LEFT LOWER EXTREMITY VENOUS DUPLEX ULTRASOUND  Technique:  Gray-scale sonography with graded compression, as well as color Doppler and duplex ultrasound were performed to evaluate the deep venous system of the lower extremity from the level of the common femoral vein through the popliteal and proximal calf veins. Spectral Doppler was utilized to evaluate flow at rest and with distal augmentation maneuvers.  Comparison:  None.  Findings:  Normal compressibility of the common femoral and superficial  femoral veins is  demonstrated.  There is hypoechoic, noncompressible, partially occlusive thrombus within the popliteal vein (images 20 and 22).  Additionally, there is echogenic near occlusive thrombus within the adjacent left sided lesser saphenous vein.  Visualized portions of the calf vasculature appear patent.  IMPRESSION: 1.  Examination is positive for nonocclusive DVT within the left popliteal vein. 2.  Near occlusive thrombus within the adjacent lesser saphenous vein compatible with concomitant superficial thrombophlebitis.  This was made a call report.   Original Report Authenticated By: Tacey Ruiz, MD         Labs: Basic Metabolic Panel:  Recent Labs Lab 08/18/12 2307 08/19/12 1114 08/20/12 0710  NA 139 139 139  K 4.0 4.0 3.6  CL 100 104 108  CO2 29 26 24   GLUCOSE 125* 145* 109*  BUN 31* 35* 19  CREATININE 1.64* 1.47* 1.13*  CALCIUM 10.7* 10.7* 8.6       CBC:  Recent Labs Lab 08/18/12 2307 08/19/12 0537  WBC 8.4 10.4  NEUTROABS 4.5  --   HGB 12.2 11.2*  HCT 36.5 33.4*  MCV 92.2 91.0  PLT 246 242     CBG:  Recent Labs Lab 08/19/12 1129 08/19/12 1833 08/19/12 2037 08/19/12 2110 08/20/12 0720  GLUCAP 90 148* 61* 95 103*       Signed:  Mohan Erven C  [facility] 08/20/2012, 10:57 AM

## 2012-08-24 ENCOUNTER — Ambulatory Visit (INDEPENDENT_AMBULATORY_CARE_PROVIDER_SITE_OTHER): Payer: Medicare Other | Admitting: Otolaryngology

## 2012-08-24 DIAGNOSIS — R040 Epistaxis: Secondary | ICD-10-CM

## 2012-08-25 ENCOUNTER — Encounter: Payer: Self-pay | Admitting: *Deleted

## 2012-08-31 ENCOUNTER — Ambulatory Visit (HOSPITAL_COMMUNITY)
Admission: RE | Admit: 2012-08-31 | Discharge: 2012-08-31 | Disposition: A | Discharge status: Home / Self Care | Payer: Medicare Other | Source: Ambulatory Visit | Attending: Family Medicine | Admitting: Family Medicine

## 2012-08-31 ENCOUNTER — Other Ambulatory Visit (HOSPITAL_COMMUNITY): Payer: Self-pay | Admitting: Family Medicine

## 2012-08-31 DIAGNOSIS — R141 Gas pain: Secondary | ICD-10-CM | POA: Insufficient documentation

## 2012-08-31 DIAGNOSIS — I10 Essential (primary) hypertension: Secondary | ICD-10-CM | POA: Insufficient documentation

## 2012-08-31 DIAGNOSIS — R609 Edema, unspecified: Secondary | ICD-10-CM

## 2012-08-31 DIAGNOSIS — R142 Eructation: Secondary | ICD-10-CM | POA: Insufficient documentation

## 2012-08-31 DIAGNOSIS — R040 Epistaxis: Secondary | ICD-10-CM

## 2012-08-31 DIAGNOSIS — E119 Type 2 diabetes mellitus without complications: Secondary | ICD-10-CM | POA: Insufficient documentation

## 2012-08-31 DIAGNOSIS — R112 Nausea with vomiting, unspecified: Secondary | ICD-10-CM | POA: Insufficient documentation

## 2012-09-06 NOTE — Progress Notes (Signed)
UR Chart Review Completed  

## 2012-09-18 ENCOUNTER — Other Ambulatory Visit: Payer: Self-pay | Admitting: Obstetrics and Gynecology

## 2012-09-18 DIAGNOSIS — R1900 Intra-abdominal and pelvic swelling, mass and lump, unspecified site: Secondary | ICD-10-CM

## 2012-09-25 ENCOUNTER — Ambulatory Visit (HOSPITAL_COMMUNITY)
Admission: RE | Admit: 2012-09-25 | Discharge: 2012-09-25 | Disposition: A | Discharge status: Home / Self Care | Payer: Medicare Other | Source: Ambulatory Visit | Attending: Obstetrics and Gynecology | Admitting: Obstetrics and Gynecology

## 2012-09-25 ENCOUNTER — Other Ambulatory Visit: Payer: Self-pay | Admitting: Obstetrics and Gynecology

## 2012-09-25 ENCOUNTER — Ambulatory Visit (HOSPITAL_COMMUNITY): Payer: Medicare Other

## 2012-09-25 DIAGNOSIS — R109 Unspecified abdominal pain: Secondary | ICD-10-CM | POA: Insufficient documentation

## 2012-09-25 DIAGNOSIS — R1900 Intra-abdominal and pelvic swelling, mass and lump, unspecified site: Secondary | ICD-10-CM

## 2012-09-28 ENCOUNTER — Ambulatory Visit (INDEPENDENT_AMBULATORY_CARE_PROVIDER_SITE_OTHER): Payer: Medicare Other | Admitting: Otolaryngology

## 2012-09-28 DIAGNOSIS — H6120 Impacted cerumen, unspecified ear: Secondary | ICD-10-CM

## 2012-09-28 DIAGNOSIS — R040 Epistaxis: Secondary | ICD-10-CM

## 2012-10-03 ENCOUNTER — Telehealth: Payer: Self-pay | Admitting: Obstetrics and Gynecology

## 2012-10-03 NOTE — Telephone Encounter (Signed)
Pt aware of results and low suspicion of malignancy. Pt comfortable c plan of rechk 6 mos with Ca125 rechk and repeat u.s here.

## 2012-11-14 ENCOUNTER — Other Ambulatory Visit: Payer: Self-pay | Admitting: Obstetrics and Gynecology

## 2012-11-14 ENCOUNTER — Other Ambulatory Visit: Payer: Self-pay | Admitting: Obstetrics & Gynecology

## 2012-11-14 DIAGNOSIS — R1907 Generalized intra-abdominal and pelvic swelling, mass and lump: Secondary | ICD-10-CM

## 2012-12-19 ENCOUNTER — Encounter: Payer: Self-pay | Admitting: *Deleted

## 2012-12-20 ENCOUNTER — Ambulatory Visit (INDEPENDENT_AMBULATORY_CARE_PROVIDER_SITE_OTHER): Payer: Medicare Other | Admitting: Obstetrics and Gynecology

## 2012-12-20 ENCOUNTER — Encounter: Payer: Self-pay | Admitting: Obstetrics and Gynecology

## 2012-12-20 ENCOUNTER — Ambulatory Visit (INDEPENDENT_AMBULATORY_CARE_PROVIDER_SITE_OTHER): Payer: Medicare Other

## 2012-12-20 VITALS — BP 140/82 | Ht 62.0 in | Wt 155.8 lb

## 2012-12-20 DIAGNOSIS — Z09 Encounter for follow-up examination after completed treatment for conditions other than malignant neoplasm: Secondary | ICD-10-CM

## 2012-12-20 DIAGNOSIS — R1907 Generalized intra-abdominal and pelvic swelling, mass and lump: Secondary | ICD-10-CM

## 2012-12-20 DIAGNOSIS — D3910 Neoplasm of uncertain behavior of unspecified ovary: Secondary | ICD-10-CM

## 2012-12-20 NOTE — Patient Instructions (Signed)
Obtain blood work today,  Repeat u/s of pelvis in 6  Months.

## 2012-12-20 NOTE — Addendum Note (Signed)
Addended by: Malachy Mood S on: 12/20/2012 11:50 AM   Modules accepted: Orders

## 2012-12-20 NOTE — Progress Notes (Signed)
Followup of Pelvic cystic structure.  U/s Today :  Unchanged with 2 small septations, no solid components. Plan Will rechk ca125,  If normal,will followuup  In 6 months.

## 2012-12-21 LAB — CA 125: CA 125: 5.4 U/mL (ref 0.0–30.2)

## 2013-03-20 ENCOUNTER — Ambulatory Visit (HOSPITAL_COMMUNITY)
Admission: RE | Admit: 2013-03-20 | Discharge: 2013-03-20 | Disposition: A | Discharge status: Home / Self Care | Payer: Medicare Other | Source: Ambulatory Visit | Attending: Family Medicine | Admitting: Family Medicine

## 2013-03-20 ENCOUNTER — Other Ambulatory Visit (HOSPITAL_COMMUNITY): Payer: Self-pay | Admitting: Family Medicine

## 2013-03-20 DIAGNOSIS — M7989 Other specified soft tissue disorders: Secondary | ICD-10-CM

## 2013-03-20 DIAGNOSIS — M25572 Pain in left ankle and joints of left foot: Secondary | ICD-10-CM

## 2013-05-19 IMAGING — US US TRANSVAGINAL NON-OB
1 series · 13 of 25 positions shown · non-contrast
Comparison: Pelvic ultrasound 08/20/2012 and CT abdomen pelvis
08/19/2012.  CT abdomen pelvis 07/18/2006, pelvic ultrasound
03/12/2011

CLINICAL DATA: Cystic pelvic mass.  Hysterectomy.



[Series 1: us transvaginal non-ob · 0.30mm/px · 76 acquisitions, 13 frames shown]
[im 1/76]
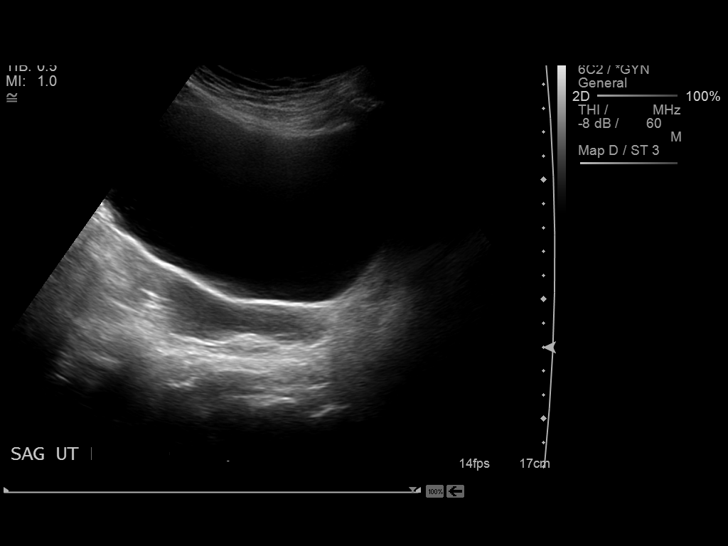
[im 7/76]
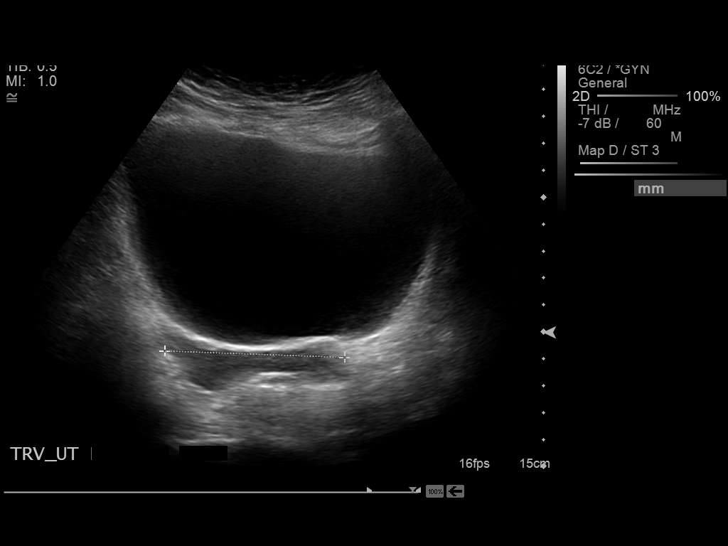
[im 13/76]
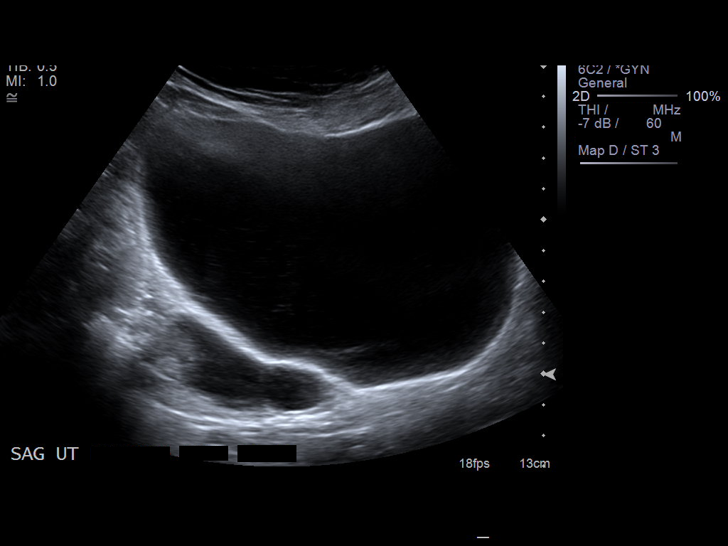
[im 19/76]
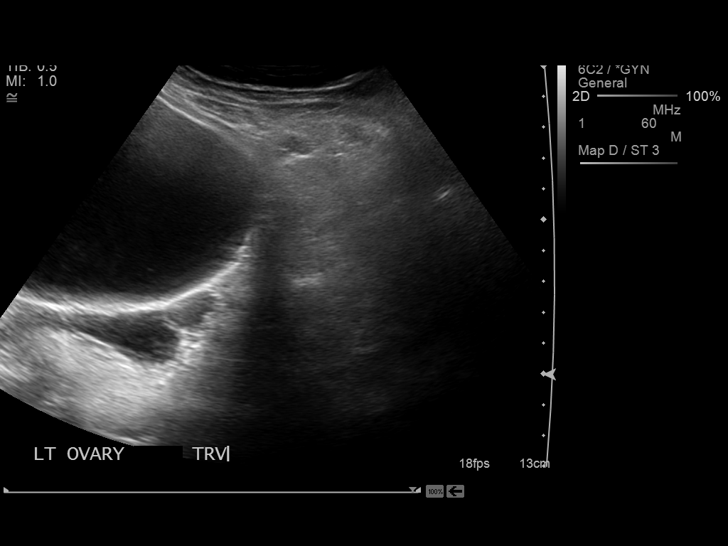
[im 26/76]
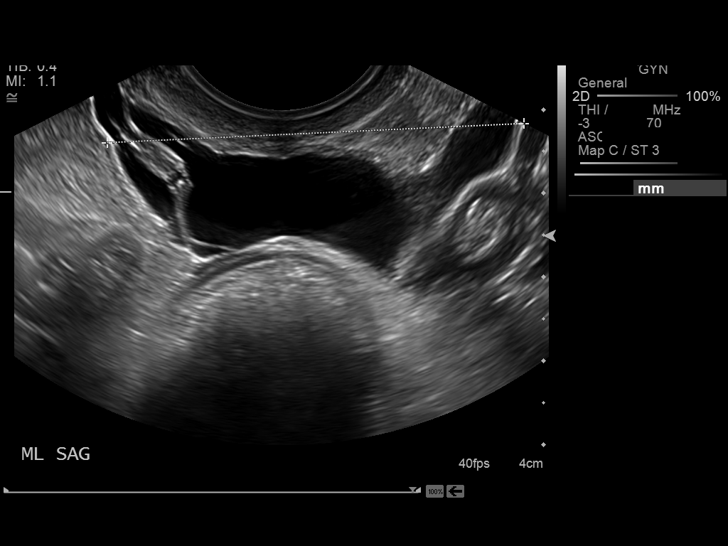
[im 32/76]
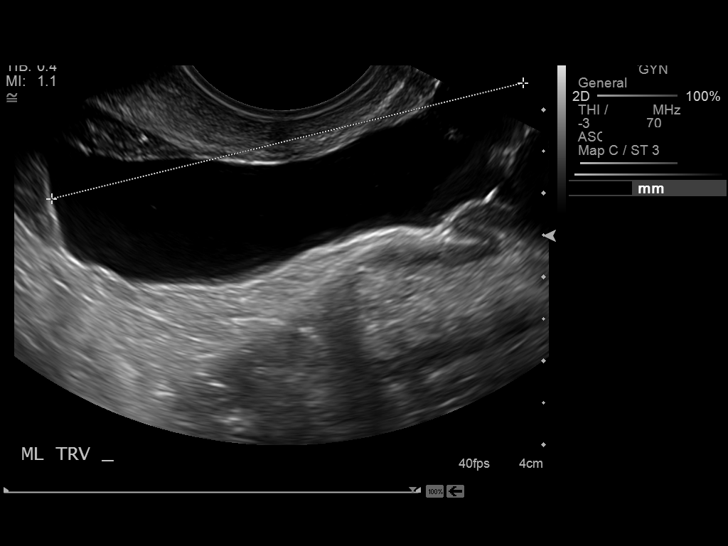
[im 38/76]
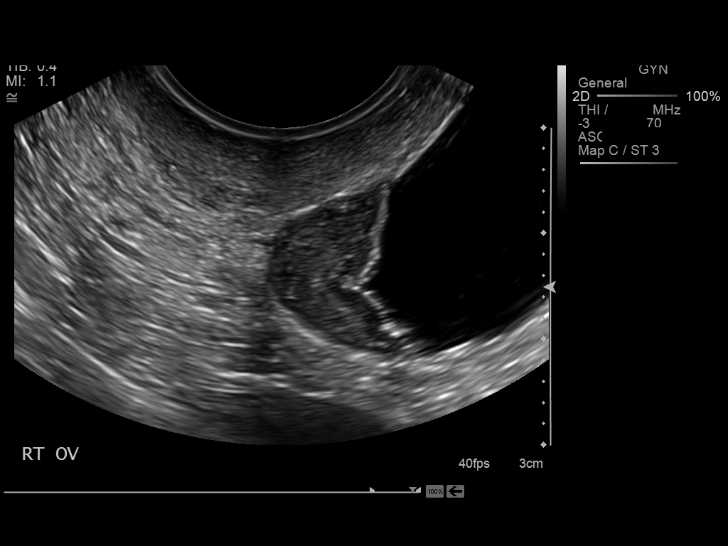
[im 44/76]
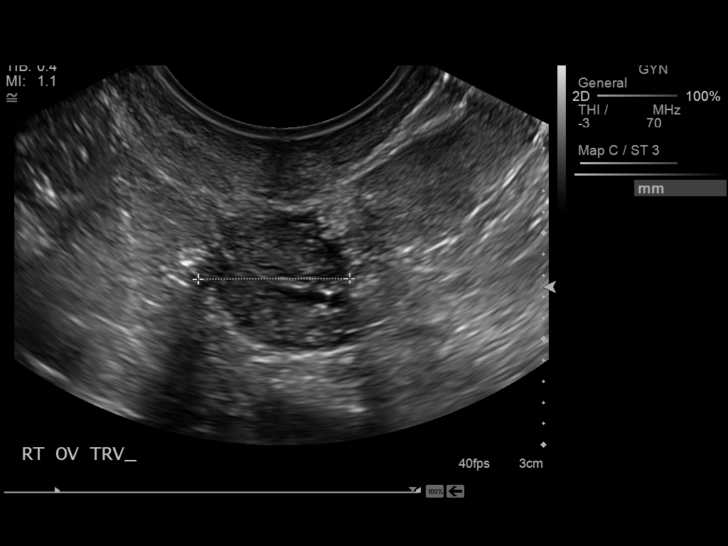
[im 51/76]
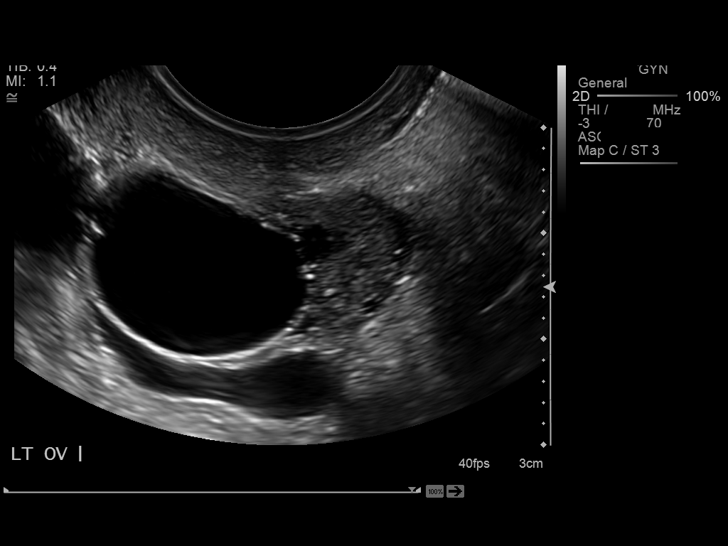
[im 57/76]
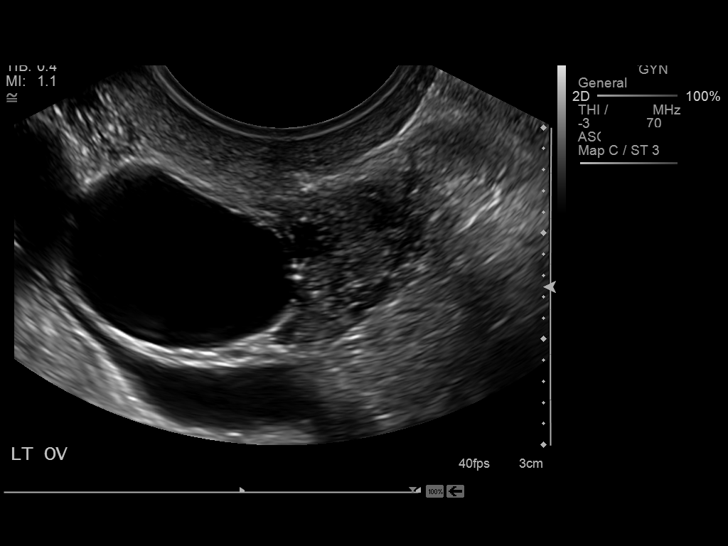
[im 63/76]
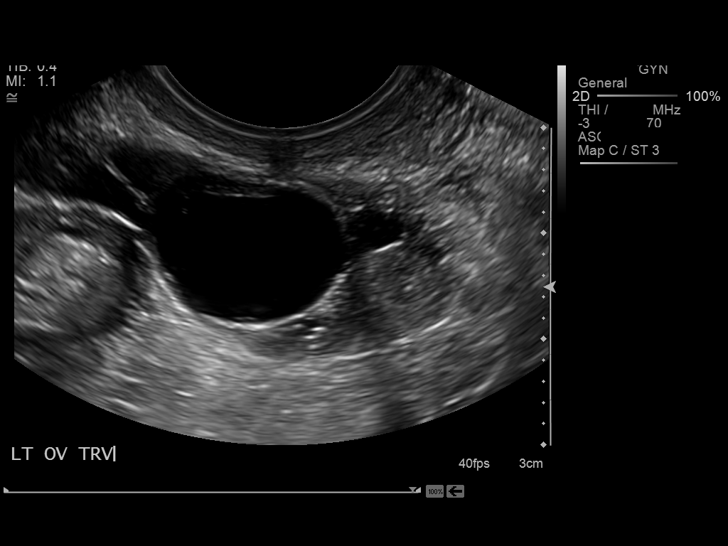
[im 69/76]
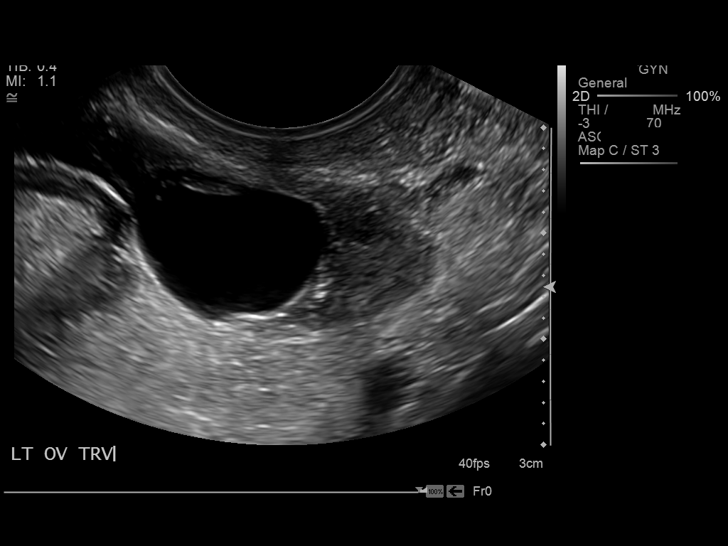
[im 76/76]
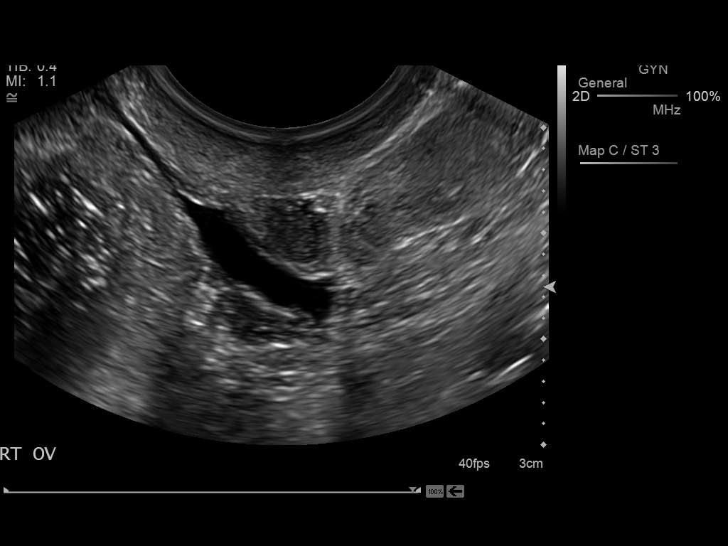

[13 of 25 positions shown; findings below may reference images not displayed]

FINDINGS: Uterus: Surgically absent

Endometrium: Surgically absent

Right ovary:  Measures 1.5 x 1.5 x 1.4 cm.

Left ovary: Measures 1.4 x 1.0 x 1.0 cm.

Other findings: Centered in the midline pelvis, anterior to the
rectum, and slightly cephalad and posterior to the urinary bladder
is an oblong mildly complex fluid collection that follows the
expected contour of the peritoneal cavity and measures in excess of
6.0 x 1.5 x 5.0 cm (the fluid collection cannot completely be
included on one ultrasound imaging screen.  The right lateral
margin of the fluid collection abuts the right ovary.  The left
lateral margin of the fluid collection of abuts the left ovary.  No
vascular flow is seen within the fluid collection.
IMPRESSION: Large pelvic fluid collection positioned anterior to
the rectum and posterior and slightly cephalad to the urinary
bladder.  The lateral margins of this fluid collection abut both
ovaries.  Upon review of prior studies, it is noted that this
pelvic fluid collection was present in March 2011 and appears
without significant change in size or appearance.  It is noted that
the patient had an acute inflammatory process of the colon with
associated ascites in July 2006 (see CT 07/18/2006).  It is
favored that the pelvic fluid collection is residual benign
peritoneal inclusion cyst related to the prior ascites.  Although
it is difficult to completely exclude that the cystic mass in the
pelvis originates from either ovary given its proximity to them, an
ovarian mass is felt less likely given the chronicity and stable
appearance, and its contiguity with the peritoneal cavity.

## 2013-06-11 ENCOUNTER — Ambulatory Visit (INDEPENDENT_AMBULATORY_CARE_PROVIDER_SITE_OTHER): Payer: Medicare Other | Admitting: Obstetrics and Gynecology

## 2013-06-11 ENCOUNTER — Encounter: Payer: Self-pay | Admitting: Obstetrics and Gynecology

## 2013-06-11 ENCOUNTER — Encounter (INDEPENDENT_AMBULATORY_CARE_PROVIDER_SITE_OTHER): Payer: Self-pay

## 2013-06-11 ENCOUNTER — Other Ambulatory Visit: Payer: Medicare Other | Admitting: Obstetrics and Gynecology

## 2013-06-11 VITALS — BP 140/76 | Ht 63.0 in | Wt 162.0 lb

## 2013-06-11 DIAGNOSIS — R1900 Intra-abdominal and pelvic swelling, mass and lump, unspecified site: Secondary | ICD-10-CM

## 2013-06-11 NOTE — Progress Notes (Signed)
    Assessment:  Annual Gyn Exam Six-month followup for CA 125 for cystic pelvic mass, stable    Plan:  1.  Will order a CA 125 today 2.  return annually or prn 3    Annual mammogram advised Subjective:  [name] is a 72 y.o. female G3P3 who presents for annual exam. No LMP recorded. Patient has had a hysterectomy. she was seen 6 months ago for CA 125 DT the cystic mass in the pelvis felt to represent either a hydrosalpinx or residual fluid loculation status post her many surgeries The patient has complaints today of no GYN complaints, patient has had a difficult time getting her coagulation values therapeutic, with several hypercoagulable episodes while on Coumadin. Currently on Coumadin 3 mg daily  The following portions of the patient's history were reviewed and updated as appropriate: allergies, current medications, past family history, past medical history, past social history, past surgical history and problem list.  Review of Systems Constitutional: positive for Recent blood clot requiring anticoagulation currently 3 mg per day Coumadin Gastrointestinal: negative, Recent Hemoccult sent in 2 laboratory Genitourinary: No pelvic masses discomfort or other problems  Objective:  BP 140/76  Ht 5\' 3"  (1.6 m)  Wt 162 lb (73.483 kg)  BMI 28.70 kg/m2   BMI: Body mass index is 28.7 kg/(m^2).  General Appearance: Alert, appropriate appearance for age. No acute distress HEENT: Grossly normal Neck / Thyroid:  Cardiovascular: RRR; normal S1, S2, no murmur Lungs: CTA bilaterally Back: No CVAT Breast Exam: No dimpling, nipple retraction or discharge. No masses or nodes. and No masses or nodes.No dimpling, nipple retraction or discharge. Gastrointestinal: Soft, non-tender, no masses or organomegaly Pelvic Exam: Vaginal: normal mucosa without prolapse or lesions and atrophic mucosa Adnexa: normal bimanual exam Rectovaginal: not indicated and declined by the patient Lymphatic Exam:  Non-palpable nodes in neck, clavicular, axillary, or inguinal regions Skin: no rash or abnormalities Neurologic: Normal gait and speech, no tremor  Psychiatric: Alert and oriented, appropriate affect.  Urinalysis:Not done  Christin Bach. MD Pgr [phone] 4:38 PM

## 2013-06-11 NOTE — Patient Instructions (Signed)
You should receive her call within a week with the results of the blood test today. Please call our office if this does not occur

## 2013-06-14 ENCOUNTER — Other Ambulatory Visit: Payer: Medicare Other

## 2013-06-14 ENCOUNTER — Other Ambulatory Visit: Payer: Self-pay | Admitting: *Deleted

## 2013-06-14 DIAGNOSIS — D3910 Neoplasm of uncertain behavior of unspecified ovary: Secondary | ICD-10-CM

## 2013-06-14 NOTE — Addendum Note (Signed)
Addended by: Richardson Chiquito on: 06/14/2013 12:25 PM   Modules accepted: Orders

## 2013-06-15 LAB — CA 125: CA 125: 5.7 U/mL (ref 0.0–30.2)

## 2013-06-15 MED ORDER — ESTRADIOL 0.075 MG/24HR TD PTTW: 1.0000 | MEDICATED_PATCH | TRANSDERMAL | Status: DC

## 2013-06-15 NOTE — Telephone Encounter (Signed)
Refill of vivelle x 12 mos done

## 2013-06-17 ENCOUNTER — Telehealth: Payer: Self-pay | Admitting: Obstetrics and Gynecology

## 2013-06-17 NOTE — Telephone Encounter (Signed)
Normal Ca-125 results relayed to pt. Pt's husband in ICU with renal failure at [facility]

## 2013-06-19 ENCOUNTER — Telehealth: Payer: Self-pay | Admitting: *Deleted

## 2013-06-19 NOTE — Telephone Encounter (Signed)
Pt insurance will not cover the vivelle-dot. Pharmacy staff states will cover premarin cream or actonel

## 2013-06-21 NOTE — Telephone Encounter (Signed)
Approval obtained thru office prior authorization forms

## 2013-07-10 ENCOUNTER — Encounter: Payer: Self-pay | Admitting: Obstetrics and Gynecology

## 2013-11-06 ENCOUNTER — Encounter: Payer: Self-pay | Admitting: Women's Health

## 2013-11-06 ENCOUNTER — Ambulatory Visit (INDEPENDENT_AMBULATORY_CARE_PROVIDER_SITE_OTHER): Payer: Medicare Other | Admitting: Women's Health

## 2013-11-06 VITALS — BP 138/70 | Ht 62.5 in | Wt 157.2 lb

## 2013-11-06 DIAGNOSIS — N899 Noninflammatory disorder of vagina, unspecified: Secondary | ICD-10-CM

## 2013-11-06 DIAGNOSIS — N898 Other specified noninflammatory disorders of vagina: Secondary | ICD-10-CM

## 2013-11-06 DIAGNOSIS — A499 Bacterial infection, unspecified: Secondary | ICD-10-CM

## 2013-11-06 DIAGNOSIS — R1900 Intra-abdominal and pelvic swelling, mass and lump, unspecified site: Secondary | ICD-10-CM | POA: Insufficient documentation

## 2013-11-06 DIAGNOSIS — N939 Abnormal uterine and vaginal bleeding, unspecified: Secondary | ICD-10-CM

## 2013-11-06 DIAGNOSIS — B9689 Other specified bacterial agents as the cause of diseases classified elsewhere: Secondary | ICD-10-CM

## 2013-11-06 DIAGNOSIS — N760 Acute vaginitis: Secondary | ICD-10-CM

## 2013-11-06 LAB — POCT WET PREP (WET MOUNT): Clue Cells Wet Prep Whiff POC: POSITIVE

## 2013-11-06 MED ORDER — CLINDAMYCIN PHOSPHATE 2 % VA CREA: 1.0000 | TOPICAL_CREAM | Freq: Every day | VAGINAL | Status: DC

## 2013-11-06 NOTE — Progress Notes (Signed)
Patient ID: [name], female   DOB: July 06, 1940, 73 y.o.   MRN: [id number]   [facility] Visit  Patient name: [name] MRN [id number]  Date of birth: 07/11/1940  CC & HPI:  [name] is a 73 y.o. Caucasian female s/p hysterectomy 53yrs ago @ 73yo, presenting today for report of bright red vaginal bleeding 'like a period' and cramping x few days around Easter holiday that resolved spontaneously, few weeks later it happened again. She had sexual intercourse w/ her husband ~1wk ago and reported intense burning pain w/ penetration- went to br and noticed vaginal bleeding again, none now. Denies malodorous d/c, h/o STIs.  She does have a h/o of pelvic mass w/ neg malignancy work-up by Dr. Glo Herring last year.   Last pelvic u/s 12/20/12: Uterus Surgically absent, Endometrium N/a, Right ovary 2.0 x 0.7 x .06 cm, no suspicious areas, Left ovary 1.5 x 1.0 x 0.8 cm, Central area in pelvis with loculations, thin septation 1 mm, no solid area or excresences. Impression: Loculated pelvic fluid in pelvis s/p hysterectomy with benign characteristics. Correlated with Ca-125, which is low at 5.6. Follow 6-12 months , if no changes no further followup planned. [name], 9:34 PM, 12/26/2012   Pertinent History Reviewed:  Medical & Surgical Hx:   Past Medical History  Diagnosis Date  . Hypertension   . Diabetes mellitus   . Thyroid disease   . DVT of axillary vein, acute left 07/24/12  . Back pain   . Vertigo     chonic  . Mixed hyperlipidemia   . GERD (gastroesophageal reflux disease)   . Glaucoma   . Sigmoid diverticulitis   . Asthmatic bronchitis   . Pinched nerve     right elbow  . Chronic kidney disease     kidney function low   Past Surgical History  Procedure Laterality Date  . Other surgical history      colostomy, colostomy reversal, surgical hernia repair, arm surgery, neck surgery  . Abdominal hysterectomy    . Fracture left foot    . Eye surgery    . Back  surgery      spinal   . Cholecystectomy    . Colon surgery    . Hernia repair     Medications: Reviewed & Updated - see associated section Social History: Reviewed -  reports that she has quit smoking. Her smoking use included Cigarettes. She smoked 0.00 packs per day. She has never used smokeless tobacco.  Objective Findings:  Vitals: BP 138/70  Ht 5' 2.5" (1.588 m)  Wt 157 lb 3.2 oz (71.305 kg)  BMI 28.28 kg/m2  Physical Examination: General appearance - alert, well appearing, and in no distress Abdomen - soft, non-distended, some RLQ tenderness Pelvic - atrophic vagina c/w postmenopausal state, erythematous vaginal walls, scant amount thin yellow malodorous d/c, cx surgically absent Bimanual- vaginal discomfort, no adnexal tenderness/masses felt, suprapubic region fullness  Results for orders placed in visit on 11/06/13 (from the past 24 hour(s))  POCT WET PREP (WET MOUNT)   Collection Time    11/06/13 12:45 PM      Result Value Ref Range   Source Wet Prep POC vaginal     WBC, Wet Prep HPF POC none     Bacteria Wet Prep HPF POC none     BACTERIA WET PREP MORPHOLOGY POC       Clue Cells Wet Prep HPF POC Many     CLUE CELLS WET  PREP WHIFF POC Positive Whiff     Yeast Wet Prep HPF POC None     KOH Wet Prep POC       Trichomonas Wet Prep HPF POC none       Assessment & Plan:  A:   Postmenopausal  Post-hysterectomy @ 73yo   Vaginal bleeding  Known pelvic mass, benign  BV P:  Rx clindamycin vaginal cream 2% q hs x7d (metronidazole had warning w/ concurrent coumadin)   F/U 1wk for pelvic u/s and f/u w/ JVF  Tawnya Crook CNM, [facility] 11/06/2013 12:47 PM

## 2013-11-14 ENCOUNTER — Ambulatory Visit: Payer: Medicare Other | Admitting: Obstetrics and Gynecology

## 2013-11-14 ENCOUNTER — Other Ambulatory Visit: Payer: Medicare Other

## 2013-11-21 ENCOUNTER — Ambulatory Visit (INDEPENDENT_AMBULATORY_CARE_PROVIDER_SITE_OTHER): Payer: Medicare Other | Admitting: Obstetrics and Gynecology

## 2013-11-21 ENCOUNTER — Ambulatory Visit (INDEPENDENT_AMBULATORY_CARE_PROVIDER_SITE_OTHER): Payer: Medicare Other

## 2013-11-21 ENCOUNTER — Encounter: Payer: Self-pay | Admitting: Obstetrics and Gynecology

## 2013-11-21 ENCOUNTER — Other Ambulatory Visit: Payer: Self-pay | Admitting: Women's Health

## 2013-11-21 VITALS — BP 130/76 | Ht 62.5 in | Wt 161.0 lb

## 2013-11-21 DIAGNOSIS — N939 Abnormal uterine and vaginal bleeding, unspecified: Secondary | ICD-10-CM

## 2013-11-21 DIAGNOSIS — R1900 Intra-abdominal and pelvic swelling, mass and lump, unspecified site: Secondary | ICD-10-CM

## 2013-11-21 DIAGNOSIS — N83209 Unspecified ovarian cyst, unspecified side: Secondary | ICD-10-CM

## 2013-11-21 DIAGNOSIS — N898 Other specified noninflammatory disorders of vagina: Secondary | ICD-10-CM

## 2013-11-21 NOTE — Patient Instructions (Signed)
Notify us of any increasing abd pains.

## 2013-11-21 NOTE — Progress Notes (Signed)
Patient ID: [name], female   DOB: [birth year], 73 y.o.   MRN: [id number] [name] is a 73 y.o. G3P3 for a pelvic sonogram for vaginal bleeding. Pt is s/p hysterectomy and history of pelvic mass.  Uterus Surgically Absent Vag cuff appears with fluid collection noted in ML with mobile internal debris and septations noted within = 5.0 x 2.2 x 1.6cm  Right ovary 1.5 x 1.0 x 0.7 cm,  Left ovary 1.3 x 0.7 x 0.7 cm,  No free fluid noted within pelvis  Technician Comments:  Vaginal cuff appears with fluid collection noted in ML with mobile internal debris and septations noted within = 5.0 x 2.2 x 1.6cm, bilateral adnexa/ovaries appear WNL, no free fluid  Alicia Amel  11/21/2013  11:22 AM  Pt having no acute complaints will follow Ca 125 today. Will follow prn pain c/o

## 2013-12-06 ENCOUNTER — Encounter (INDEPENDENT_AMBULATORY_CARE_PROVIDER_SITE_OTHER): Payer: Self-pay | Admitting: *Deleted

## 2013-12-10 ENCOUNTER — Ambulatory Visit (INDEPENDENT_AMBULATORY_CARE_PROVIDER_SITE_OTHER): Payer: Medicare Other | Admitting: Internal Medicine

## 2013-12-10 ENCOUNTER — Telehealth (INDEPENDENT_AMBULATORY_CARE_PROVIDER_SITE_OTHER): Payer: Self-pay | Admitting: *Deleted

## 2013-12-10 ENCOUNTER — Other Ambulatory Visit (INDEPENDENT_AMBULATORY_CARE_PROVIDER_SITE_OTHER): Payer: Self-pay | Admitting: *Deleted

## 2013-12-10 ENCOUNTER — Encounter (INDEPENDENT_AMBULATORY_CARE_PROVIDER_SITE_OTHER): Payer: Self-pay | Admitting: Internal Medicine

## 2013-12-10 VITALS — BP 126/54 | HR 60 | Temp 97.7°F | Ht 62.5 in | Wt 160.3 lb

## 2013-12-10 DIAGNOSIS — Z1211 Encounter for screening for malignant neoplasm of colon: Secondary | ICD-10-CM

## 2013-12-10 DIAGNOSIS — K625 Hemorrhage of anus and rectum: Secondary | ICD-10-CM

## 2013-12-10 NOTE — Patient Instructions (Signed)
Colonoscopy with Dr. Rehman. The risks and benefits such as perforation, bleeding, and infection were reviewed with the patient and is agreeable. 

## 2013-12-10 NOTE — Telephone Encounter (Signed)
Patient needs movi prep 

## 2013-12-10 NOTE — Progress Notes (Signed)
Subjective:     Patient ID: [name], female   DOB: 05/24/1941, 73 y.o.   MRN: [id number]  HPI Referred to o7ur office by Donia Ast for blood in her stools. She tells me she had positive stool card for her insurance company. She has also seen blood in her stools. She has been seeing off and on since December. She tells me she has a hx of colon polyps. Appetite is good. No unintentional weight loss. No dysphagia.  Occasionally acid reflux and controlled with Nexium. Usually has a BM daily. She takes Miralax daily.  Stools are the same except some are darker than others.  Presently taking iron.  She is on chronic coumadin therapy for a DVT to left leg (Diagmosed last year with DVT). 07/09/2013 Glucose 119, BUN 24, Creatinine 1.38 H and H  11.8 and 37.3, MCV 86.9  11/11/2006 OPERATIVE REPORT  PREOPERATIVE DIAGNOSIS: Perforated sigmoid diverticulitis, status post  Hartmann's procedure.  POSTOPERATIVE DIAGNOSIS: Perforated sigmoid diverticulitis, status post  Hartmann's procedure.  PROCEDURE: Colostomy reversal, splenic flexure takedown.  SURGEON: Jamesetta So, M.D.    Colonoscopy in 2006: rectal bleeding, family hx of colon cancer in a sister age in her 71s.  FINAL DIAGNOSES:  1. Few diverticula at sigmoid colon.  2. Small polyp ablated via cold biopsy at ascending colon. Another small  polyp at hepatic flexure was ablated via cold biopsy, and third polyp  was larger and sessile. It was biopsied for histology, and rest was  coagulated using snare tip.   Biopsy:  1) polyps (hepatic flexure, polypectomy): HYPERPLASTIC POLYPS. 2) polyp (Ascending colon, polypectomy): INFLAMED FOCALLY ADENOMATOUS POLYP.    Review of Systems Past Medical History  Diagnosis Date  . Hypertension   . Diabetes mellitus     x 5 yrs  . Thyroid disease   . DVT of axillary vein, acute left 07/24/12  . Back pain   . Vertigo     chonic  . Mixed hyperlipidemia   . GERD (gastroesophageal  reflux disease)   . Glaucoma   . Sigmoid diverticulitis   . Asthmatic bronchitis   . Pinched nerve     right elbow  . Chronic kidney disease     kidney function low    Past Surgical History  Procedure Laterality Date  . Other surgical history      colostomy, colostomy reversal, for diverticulitis surgical hernia repair, arm surgery, neck surgery  . Abdominal hysterectomy    . Fracture left foot    . Eye surgery    . Back surgery      spinal   . Cholecystectomy    . Colon surgery    . Hernia repair      Allergies  Allergen Reactions  . Vioxx [Rofecoxib] Shortness Of Breath  . Penicillins     rash  . Codeine Rash  . Motrin [Ibuprofen] Rash  . Sulfur Rash    Current Outpatient Prescriptions on File Prior to Visit  Medication Sig Dispense Refill  . amitriptyline (ELAVIL) 25 MG tablet Take 25 mg by mouth at bedtime.      . Coenzyme Q10 (CO Q-10) 400 MG CAPS Take 1 tablet by mouth 2 (two) times daily.      [name] esomeprazole (NEXIUM) 20 MG capsule Take 20 mg by mouth daily at 12 noon.      . furosemide (LASIX) 20 MG tablet Take 20 mg by mouth daily as needed (FOR FLUID RETENTION).      [name] latanoprost (  XALATAN) 0.005 % ophthalmic solution 1 drop at bedtime.      . meclizine (ANTIVERT) 25 MG tablet Take 25 mg by mouth as needed.      . Omega-3 Fatty Acids (FISH OIL) 1200 MG CAPS Take 1 capsule by mouth 2 (two) times daily.      . polyethylene glycol (MIRALAX / GLYCOLAX) packet Take 17 g by mouth daily.      . Polysaccharide Iron Complex (NU-IRON PO) Take by mouth.      . potassium chloride (K-DUR) 10 MEQ tablet Take 10 mEq by mouth daily. *TO BE TAKEN WITH LASIX (FUROSEMIDE)      . Red Yeast Rice 600 MG CAPS Take 1 capsule by mouth daily.      . sitaGLIPtin (JANUVIA) 50 MG tablet Take 50 mg by mouth as needed.      . topiramate (TOPAMAX) 25 MG tablet Take 25-50 mg by mouth 2 (two) times daily. TAKE TWO TABLETS IN THE MORNING AND TAKE ONE TABLET IN THE EVENING      . warfarin  (COUMADIN) 4 MG tablet Take 3 mg by mouth every evening.       . clindamycin (CLEOCIN) 2 % vaginal cream Place 1 Applicatorful vaginally at bedtime. X 7 days  40 g  0   No current facility-administered medications on file prior to visit.        Objective:   Physical Exam  Filed Vitals:   12/10/13 1401  BP: 126/54  Pulse: 60  Temp: 97.7 F (36.5 C)  Height: 5' 2.5" (1.588 m)  Weight: 160 lb 4.8 oz (72.712 kg)    Alert and oriented. Skin warm and dry. Oral mucosa is moist.   . Sclera anicteric, conjunctivae is pink. Thyroid not enlarged. No cervical lymphadenopathy. Lungs clear. Heart regular rate and rhythm.  Abdomen is soft. Bowel sounds are positive. No hepatomegaly. No abdominal masses felt. No tenderness. Multiple scars to abdomen.  1+ edema to lower extremities.  Stool brown and guaiac negative.     Assessment:     Rectal bleeding. Colonic neoplasm, polyp needs to be ruled out. Family hx of colon cancer.    Plan:     Colonoscopy with Dr. Laural Golden. The risks and benefits such as perforation, bleeding, and infection were reviewed with the patient and is agreeable.

## 2013-12-11 MED ORDER — PEG-KCL-NACL-NASULF-NA ASC-C 100 G PO SOLR: 1.0000 | Freq: Once | ORAL | Status: DC

## 2013-12-19 ENCOUNTER — Encounter (HOSPITAL_COMMUNITY): Payer: Self-pay | Admitting: Pharmacy Technician

## 2013-12-27 ENCOUNTER — Ambulatory Visit (HOSPITAL_COMMUNITY)
Admission: RE | Admit: 2013-12-27 | Discharge: 2013-12-27 | Disposition: A | Discharge status: Home / Self Care | Payer: Medicare Other | Source: Ambulatory Visit | Attending: Internal Medicine | Admitting: Internal Medicine

## 2013-12-27 ENCOUNTER — Encounter (HOSPITAL_COMMUNITY)
Admission: RE | Disposition: A | Discharge status: Home / Self Care | Payer: Self-pay | Source: Ambulatory Visit | Attending: Internal Medicine

## 2013-12-27 ENCOUNTER — Encounter (HOSPITAL_COMMUNITY): Payer: Self-pay

## 2013-12-27 DIAGNOSIS — Q2733 Arteriovenous malformation of digestive system vessel: Secondary | ICD-10-CM

## 2013-12-27 DIAGNOSIS — Z86718 Personal history of other venous thrombosis and embolism: Secondary | ICD-10-CM | POA: Insufficient documentation

## 2013-12-27 DIAGNOSIS — Z800 Family history of malignant neoplasm of digestive organs: Secondary | ICD-10-CM

## 2013-12-27 DIAGNOSIS — K5730 Diverticulosis of large intestine without perforation or abscess without bleeding: Secondary | ICD-10-CM | POA: Insufficient documentation

## 2013-12-27 DIAGNOSIS — E782 Mixed hyperlipidemia: Secondary | ICD-10-CM | POA: Insufficient documentation

## 2013-12-27 DIAGNOSIS — Z860100 Personal history of colon polyps, unspecified: Secondary | ICD-10-CM | POA: Insufficient documentation

## 2013-12-27 DIAGNOSIS — N189 Chronic kidney disease, unspecified: Secondary | ICD-10-CM | POA: Insufficient documentation

## 2013-12-27 DIAGNOSIS — K219 Gastro-esophageal reflux disease without esophagitis: Secondary | ICD-10-CM | POA: Insufficient documentation

## 2013-12-27 DIAGNOSIS — K5520 Angiodysplasia of colon without hemorrhage: Secondary | ICD-10-CM | POA: Insufficient documentation

## 2013-12-27 DIAGNOSIS — K625 Hemorrhage of anus and rectum: Secondary | ICD-10-CM

## 2013-12-27 DIAGNOSIS — K921 Melena: Secondary | ICD-10-CM | POA: Insufficient documentation

## 2013-12-27 DIAGNOSIS — Z7901 Long term (current) use of anticoagulants: Secondary | ICD-10-CM | POA: Insufficient documentation

## 2013-12-27 DIAGNOSIS — I129 Hypertensive chronic kidney disease with stage 1 through stage 4 chronic kidney disease, or unspecified chronic kidney disease: Secondary | ICD-10-CM | POA: Insufficient documentation

## 2013-12-27 DIAGNOSIS — Z79899 Other long term (current) drug therapy: Secondary | ICD-10-CM | POA: Insufficient documentation

## 2013-12-27 DIAGNOSIS — E119 Type 2 diabetes mellitus without complications: Secondary | ICD-10-CM | POA: Insufficient documentation

## 2013-12-27 DIAGNOSIS — K644 Residual hemorrhoidal skin tags: Secondary | ICD-10-CM

## 2013-12-27 HISTORY — PX: COLONOSCOPY: SHX5424

## 2013-12-27 LAB — HEMOGLOBIN AND HEMATOCRIT, BLOOD
HEMATOCRIT: 31.9 % — AB (ref 36.0–46.0)
Hemoglobin: 10.4 g/dL — ABNORMAL LOW (ref 12.0–15.0)

## 2013-12-27 SURGERY — COLONOSCOPY
Anesthesia: Moderate Sedation

## 2013-12-27 MED ORDER — MEPERIDINE HCL 50 MG/ML IJ SOLN
INTRAMUSCULAR | Status: AC
  Filled 2013-12-27: qty 1

## 2013-12-27 MED ORDER — MIDAZOLAM HCL 5 MG/5ML IJ SOLN
INTRAMUSCULAR | Status: DC | PRN
  Administered 2013-12-27 (×3): 2 mg via INTRAVENOUS

## 2013-12-27 MED ORDER — STERILE WATER FOR IRRIGATION IR SOLN
Status: DC | PRN
  Administered 2013-12-27: 12:00:00

## 2013-12-27 MED ORDER — SODIUM CHLORIDE 0.9 % IV SOLN
INTRAVENOUS | Status: DC
  Administered 2013-12-27: 11:00:00 via INTRAVENOUS

## 2013-12-27 MED ORDER — MIDAZOLAM HCL 5 MG/5ML IJ SOLN
INTRAMUSCULAR | Status: AC
  Filled 2013-12-27: qty 10

## 2013-12-27 MED ORDER — MEPERIDINE HCL 50 MG/ML IJ SOLN
INTRAMUSCULAR | Status: DC | PRN
  Administered 2013-12-27 (×2): 25 mg via INTRAVENOUS

## 2013-12-27 NOTE — H&P (Signed)
[name] is an 73 y.o. female.   Chief Complaint: Patient is here for colonoscopy. HPI: Patient is 73 year old Caucasian female who's been having intermittent hematochezia since December last year. She also had positive Hemoccult. He denies abdominal pain diarrhea or constipation. There is no history of melena or frank rectal bleeding. Patient's last colonoscopy was in November 2006 with removal of 2 small polyps and one was tubular adenoma. Family history is positive for colon carcinoma in her sister who also had breast carcinoma. She died at age 12. Patient has been off warfarin for 12 days.   Past Medical History  Diagnosis Date  . Hypertension   . Diabetes mellitus     x 5 yrs  . Thyroid disease   . DVT of axillary vein, acute left 07/24/12  . Back pain   . Vertigo     chonic  . Mixed hyperlipidemia   . GERD (gastroesophageal reflux disease)   . Glaucoma   . Sigmoid diverticulitis   . Asthmatic bronchitis   . Pinched nerve     right elbow  . Chronic kidney disease     kidney function low    Past Surgical History  Procedure Laterality Date  . Other surgical history      colostomy, colostomy reversal, for diverticulitis surgical hernia repair, arm surgery, neck surgery  . Abdominal hysterectomy    . Fracture left foot    . Eye surgery    . Back surgery      spinal   . Cholecystectomy    . Colon surgery    . Hernia repair    . Colostomy closure      Family History  Problem Relation Age of Onset  . Other Mother 76    Cause unknown  . Cancer Mother     liver  . CVA Maternal Grandmother 90    deceased  . Heart disease Maternal Grandmother   . Stroke Maternal Grandmother   . Heart attack Brother 55    deceased  . Cancer Sister 30    deceased   Social History:  reports that she quit smoking about 2 weeks ago. Her smoking use included Cigarettes. She smoked 0.00 packs per day. She has never used smokeless tobacco. She reports that she does not drink alcohol  or use illicit drugs.  Allergies:  Allergies  Allergen Reactions  . Vioxx [Rofecoxib] Shortness Of Breath  . Penicillins     rash  . Codeine Rash  . Motrin [Ibuprofen] Rash  . Sulfur Rash    Medications Prior to Admission  Medication Sig Dispense Refill  . amitriptyline (ELAVIL) 25 MG tablet Take 25 mg by mouth at bedtime.      . Coenzyme Q10 (CO Q-10) 400 MG CAPS Take 1 tablet by mouth 2 (two) times daily.      [name] esomeprazole (NEXIUM) 20 MG capsule Take 20 mg by mouth daily at 12 noon.      . furosemide (LASIX) 20 MG tablet Take 20 mg by mouth daily as needed (FOR FLUID RETENTION).      [name] latanoprost (XALATAN) 0.005 % ophthalmic solution 1 drop at bedtime.      . meclizine (ANTIVERT) 25 MG tablet Take 25 mg by mouth as needed.      . Omega-3 Fatty Acids (FISH OIL) 1200 MG CAPS Take 1 capsule by mouth 2 (two) times daily.      . polyethylene glycol (MIRALAX / GLYCOLAX) packet Take 17 g by mouth daily.      [name]  Polysaccharide Iron Complex (NU-IRON PO) Take by mouth.      . potassium chloride (K-DUR) 10 MEQ tablet Take 10 mEq by mouth daily. *TO BE TAKEN WITH LASIX (FUROSEMIDE)      . Red Yeast Rice 600 MG CAPS Take 1 capsule by mouth daily.      . sitaGLIPtin (JANUVIA) 50 MG tablet Take 50 mg by mouth as needed.      . topiramate (TOPAMAX) 25 MG tablet Take 25-50 mg by mouth 2 (two) times daily. TAKE TWO TABLETS IN THE MORNING AND TAKE ONE TABLET IN THE EVENING      . warfarin (COUMADIN) 4 MG tablet Take 3 mg by mouth every evening.         No results found for this or any previous visit (from the past 48 hour(s)). No results found.  ROS  Blood pressure 132/70, pulse 77, temperature 98.2 F (36.8 C), temperature source Oral, resp. rate 18, height 5\' 2"  (1.575 m), weight 160 lb (72.576 kg), SpO2 96.00%. Physical Exam  Constitutional: She appears well-developed and well-nourished.  HENT:  Mouth/Throat: Oropharynx is clear and moist.  Eyes: Conjunctivae are normal. No scleral  icterus.  Neck: No thyromegaly present.  Cardiovascular: Normal rate, regular rhythm and normal heart sounds.   No murmur heard. Respiratory: Effort normal.  GI: Soft. She exhibits no distension and no mass. There is no tenderness.  Midline scar and the abdominal wall; no hernia noted in supine position  Musculoskeletal: She exhibits no edema.  Lymphadenopathy:    She has no cervical adenopathy.  Neurological: She is alert.  Skin: Skin is warm and dry.     Assessment/Plan History of hematochezia and heme positive stool. History of colonic adenoma and family history of colon carcinoma. Diagnostic colonoscopy.  REHMAN,NAJEEB U 12/27/2013, 12:31 PM

## 2013-12-27 NOTE — Discharge Instructions (Signed)
Resume usual medications except warfarin which can be started tomorrow evening. INR to be checked in 10 days. Resume usual diet. No driving for 24 hours. Physician will call with results of H&H.

## 2013-12-27 NOTE — Progress Notes (Signed)
hh drawn and sent to lab.

## 2013-12-27 NOTE — Op Note (Addendum)
COLONOSCOPY PROCEDURE REPORT  PATIENT:  [name]  MR#:  [id number] Birthdate:  1941/05/29, 73 y.o., female Endoscopist:  Dr. Rogene Houston, MD Referred By:  Dr. Caren Macadam, MD Procedure Date: 12/27/2013  Procedure:   Colonoscopy  Indications: Patient is 73 year old Caucasian female who has intermittent hematochezia and was also noted to have heme positive stool. She is therefore undergoing diagnostic colonoscopy. Patient's last colonoscopy was in November 2006 with removal of small tubular adenoma. Patient is on warfarin because of history of DVT. She does not take aspirin or other NSAIDs. Family history significant for CRC and sister who also had breast cancer and died at 14.  Informed Consent:  The procedure and risks were reviewed with the patient and informed consent was obtained.  Medications:  Demerol 50 mg IV Versed 6 mg IV  Description of procedure:  After a digital rectal exam was performed, that colonoscope was advanced from the anus through the rectum and colon to the area of the cecum, ileocecal valve and appendiceal orifice. The cecum was deeply intubated. These structures were well-seen and photographed for the record. From the level of the cecum and ileocecal valve, the scope was slowly and cautiously withdrawn. The mucosal surfaces were carefully surveyed utilizing scope tip to flexion to facilitate fold flattening as needed. The scope was pulled down into the rectum where a thorough exam including retroflexion was performed. Terminal ileum was also examined.  Findings:   Prep excellent. Normal mucosa of terminal ileum. Four AV malformations noted at cecum. 2 were small and the others were 6-7 mm in diameter without stigmata of bleeding. Called for what ablated with argon plasma coagulator. Small diverticulum at hepatic flexure and second one proximal to colonic anastomosis. Wide-open colonic anastomosis located at 17 cm from anal margin. Normal rectal  mucosa. Small hemorrhoids below the dentate line.   Therapeutic/Diagnostic Maneuvers Performed:  See above  Complications:  None  Cecal Withdrawal Time:  13 minutes  Impression:  Normal mucosa of terminal ileum. Four cecal AV malformations without stigmata of bleeding. These lesions were ablated with argon plasma coagulator. Two diverticula noted; one at hepatic flexure and the second one proximal to colonic anastomosis. Wide-open colonic anastomosis proximal to the rectosigmoid junction. External hemorrhoids.  Recommendations:  Standard instructions given. Patient will resume warfarin at usual dose starting tomorrow evening and get INR checked in 10 days. H&H will be checked today. Patient advised to call if she has melena or frank rectal bleeding. Next colonoscopy should be in 5 years given family history of colon carcinoma in one sibling.  REHMAN,NAJEEB U  12/27/2013 1:05 PM  CC: Dr. Hilma Favors, Betsy Coder, MD & Dr. Rayne Du ref. provider found

## 2013-12-28 ENCOUNTER — Encounter (HOSPITAL_COMMUNITY): Payer: Self-pay | Admitting: Internal Medicine

## 2013-12-31 ENCOUNTER — Telehealth (INDEPENDENT_AMBULATORY_CARE_PROVIDER_SITE_OTHER): Payer: Self-pay | Admitting: *Deleted

## 2013-12-31 ENCOUNTER — Other Ambulatory Visit (INDEPENDENT_AMBULATORY_CARE_PROVIDER_SITE_OTHER): Payer: Self-pay | Admitting: *Deleted

## 2013-12-31 ENCOUNTER — Encounter (INDEPENDENT_AMBULATORY_CARE_PROVIDER_SITE_OTHER): Payer: Self-pay | Admitting: *Deleted

## 2013-12-31 DIAGNOSIS — K625 Hemorrhage of anus and rectum: Secondary | ICD-10-CM

## 2013-12-31 NOTE — Telephone Encounter (Signed)
Per Dr.Rehman the patient will need to have labs drawn in 2 weeks. 

## 2014-01-14 LAB — HEMOGLOBIN AND HEMATOCRIT, BLOOD
HCT: 35.7 % — ABNORMAL LOW (ref 36.0–46.0)
Hemoglobin: 11.7 g/dL — ABNORMAL LOW (ref 12.0–15.0)

## 2014-03-18 ENCOUNTER — Telehealth (INDEPENDENT_AMBULATORY_CARE_PROVIDER_SITE_OTHER): Payer: Self-pay | Admitting: *Deleted

## 2014-03-18 DIAGNOSIS — K625 Hemorrhage of anus and rectum: Secondary | ICD-10-CM

## 2014-03-18 LAB — HEMOGLOBIN AND HEMATOCRIT, BLOOD
HCT: 35.6 % — ABNORMAL LOW (ref 36.0–46.0)
HEMOGLOBIN: 12.1 g/dL (ref 12.0–15.0)

## 2014-03-18 NOTE — Telephone Encounter (Signed)
Per Dr.Rehman the patient will need to have labs drawn. 

## 2014-05-03 ENCOUNTER — Inpatient Hospital Stay (HOSPITAL_COMMUNITY)
Admission: EM | Admit: 2014-05-03 | Discharge: 2014-05-07 | DRG: 684 | Disposition: A | Discharge status: Home / Self Care | Payer: Medicare Other | Attending: Internal Medicine | Admitting: Internal Medicine

## 2014-05-03 ENCOUNTER — Emergency Department (HOSPITAL_COMMUNITY): Payer: Medicare Other

## 2014-05-03 ENCOUNTER — Encounter (HOSPITAL_COMMUNITY): Payer: Self-pay | Admitting: Emergency Medicine

## 2014-05-03 ENCOUNTER — Observation Stay (HOSPITAL_COMMUNITY): Payer: Medicare Other

## 2014-05-03 DIAGNOSIS — J449 Chronic obstructive pulmonary disease, unspecified: Secondary | ICD-10-CM | POA: Diagnosis present

## 2014-05-03 DIAGNOSIS — Z823 Family history of stroke: Secondary | ICD-10-CM

## 2014-05-03 DIAGNOSIS — Z86718 Personal history of other venous thrombosis and embolism: Secondary | ICD-10-CM

## 2014-05-03 DIAGNOSIS — N19 Unspecified kidney failure: Secondary | ICD-10-CM | POA: Diagnosis present

## 2014-05-03 DIAGNOSIS — D649 Anemia, unspecified: Secondary | ICD-10-CM | POA: Diagnosis present

## 2014-05-03 DIAGNOSIS — Z880 Allergy status to penicillin: Secondary | ICD-10-CM

## 2014-05-03 DIAGNOSIS — E119 Type 2 diabetes mellitus without complications: Secondary | ICD-10-CM | POA: Insufficient documentation

## 2014-05-03 DIAGNOSIS — I129 Hypertensive chronic kidney disease with stage 1 through stage 4 chronic kidney disease, or unspecified chronic kidney disease: Secondary | ICD-10-CM | POA: Diagnosis present

## 2014-05-03 DIAGNOSIS — Z886 Allergy status to analgesic agent status: Secondary | ICD-10-CM

## 2014-05-03 DIAGNOSIS — E039 Hypothyroidism, unspecified: Secondary | ICD-10-CM | POA: Diagnosis present

## 2014-05-03 DIAGNOSIS — R1011 Right upper quadrant pain: Secondary | ICD-10-CM | POA: Diagnosis present

## 2014-05-03 DIAGNOSIS — Z9049 Acquired absence of other specified parts of digestive tract: Secondary | ICD-10-CM | POA: Diagnosis present

## 2014-05-03 DIAGNOSIS — K219 Gastro-esophageal reflux disease without esophagitis: Secondary | ICD-10-CM | POA: Diagnosis present

## 2014-05-03 DIAGNOSIS — Z882 Allergy status to sulfonamides status: Secondary | ICD-10-CM

## 2014-05-03 DIAGNOSIS — Z79899 Other long term (current) drug therapy: Secondary | ICD-10-CM

## 2014-05-03 DIAGNOSIS — E782 Mixed hyperlipidemia: Secondary | ICD-10-CM | POA: Diagnosis present

## 2014-05-03 DIAGNOSIS — Z87891 Personal history of nicotine dependence: Secondary | ICD-10-CM

## 2014-05-03 DIAGNOSIS — N179 Acute kidney failure, unspecified: Secondary | ICD-10-CM | POA: Diagnosis not present

## 2014-05-03 DIAGNOSIS — Z885 Allergy status to narcotic agent status: Secondary | ICD-10-CM

## 2014-05-03 DIAGNOSIS — Z23 Encounter for immunization: Secondary | ICD-10-CM

## 2014-05-03 DIAGNOSIS — Z8249 Family history of ischemic heart disease and other diseases of the circulatory system: Secondary | ICD-10-CM

## 2014-05-03 DIAGNOSIS — H409 Unspecified glaucoma: Secondary | ICD-10-CM | POA: Diagnosis present

## 2014-05-03 DIAGNOSIS — K2960 Other gastritis without bleeding: Secondary | ICD-10-CM | POA: Diagnosis present

## 2014-05-03 DIAGNOSIS — Z800 Family history of malignant neoplasm of digestive organs: Secondary | ICD-10-CM

## 2014-05-03 DIAGNOSIS — K2950 Unspecified chronic gastritis without bleeding: Secondary | ICD-10-CM

## 2014-05-03 DIAGNOSIS — N183 Chronic kidney disease, stage 3 (moderate): Secondary | ICD-10-CM | POA: Diagnosis present

## 2014-05-03 DIAGNOSIS — R079 Chest pain, unspecified: Secondary | ICD-10-CM | POA: Diagnosis present

## 2014-05-03 HISTORY — DX: Unspecified chronic gastritis without bleeding: K29.50

## 2014-05-03 HISTORY — DX: Hypothyroidism, unspecified: E03.9

## 2014-05-03 LAB — COMPREHENSIVE METABOLIC PANEL
ALT: 18 U/L (ref 0–35)
AST: 19 U/L (ref 0–37)
Albumin: 3.6 g/dL (ref 3.5–5.2)
Alkaline Phosphatase: 96 U/L (ref 39–117)
Anion gap: 10 (ref 5–15)
BILIRUBIN TOTAL: 0.2 mg/dL — AB (ref 0.3–1.2)
BUN: 14 mg/dL (ref 6–23)
CO2: 25 mEq/L (ref 19–32)
Calcium: 9.5 mg/dL (ref 8.4–10.5)
Chloride: 108 mEq/L (ref 96–112)
Creatinine, Ser: 1.16 mg/dL — ABNORMAL HIGH (ref 0.50–1.10)
GFR calc Af Amer: 53 mL/min — ABNORMAL LOW (ref >90)
GFR calc non Af Amer: 46 mL/min — ABNORMAL LOW (ref >90)
Glucose, Bld: 101 mg/dL — ABNORMAL HIGH (ref 70–99)
Potassium: 3.9 mEq/L (ref 3.7–5.3)
Sodium: 143 mEq/L (ref 137–147)
Total Protein: 7.2 g/dL (ref 6.0–8.3)

## 2014-05-03 LAB — CBC WITH DIFFERENTIAL/PLATELET
BASOS ABS: 0 10*3/uL (ref 0.0–0.1)
Basophils Relative: 1 % (ref 0–1)
Eosinophils Absolute: 0.1 10*3/uL (ref 0.0–0.7)
Eosinophils Relative: 2 % (ref 0–5)
HCT: 35.4 % — ABNORMAL LOW (ref 36.0–46.0)
HEMOGLOBIN: 11.2 g/dL — AB (ref 12.0–15.0)
Lymphocytes Relative: 33 % (ref 12–46)
Lymphs Abs: 2 10*3/uL (ref 0.7–4.0)
MCH: 27.5 pg (ref 26.0–34.0)
MCHC: 31.6 g/dL (ref 30.0–36.0)
MCV: 87 fL (ref 78.0–100.0)
MONOS PCT: 6 % (ref 3–12)
Monocytes Absolute: 0.4 10*3/uL (ref 0.1–1.0)
NEUTROS ABS: 3.6 10*3/uL (ref 1.7–7.7)
NEUTROS PCT: 58 % (ref 43–77)
Platelets: 297 10*3/uL (ref 150–400)
RBC: 4.07 MIL/uL (ref 3.87–5.11)
RDW: 14 % (ref 11.5–15.5)
WBC: 6.1 10*3/uL (ref 4.0–10.5)

## 2014-05-03 LAB — D-DIMER, QUANTITATIVE: D-Dimer, Quant: <0.27 ug/mL-FEU (ref 0.00–0.48)

## 2014-05-03 LAB — PROTIME-INR
INR: 1.71 — AB (ref 0.00–1.49)
PROTHROMBIN TIME: 20.2 s — AB (ref 11.6–15.2)

## 2014-05-03 LAB — LIPASE, BLOOD: Lipase: 54 U/L (ref 11–59)

## 2014-05-03 LAB — GLUCOSE, CAPILLARY: GLUCOSE-CAPILLARY: 97 mg/dL (ref 70–99)

## 2014-05-03 LAB — TROPONIN I
Troponin I: <0.3 ng/mL (ref <0.30)
Troponin I: <0.3 ng/mL (ref <0.30)
Troponin I: <0.3 ng/mL (ref <0.30)

## 2014-05-03 MED ORDER — PANTOPRAZOLE SODIUM 40 MG PO TBEC
40.0000 mg | DELAYED_RELEASE_TABLET | Freq: Every day | ORAL | Status: DC
  Administered 2014-05-03 – 2014-05-07 (×5): 40 mg via ORAL
  Filled 2014-05-03 (×5): qty 1

## 2014-05-03 MED ORDER — ACETAMINOPHEN 650 MG RE SUPP: 650.0000 mg | Freq: Four times a day (QID) | RECTAL | Status: DC | PRN

## 2014-05-03 MED ORDER — PNEUMOCOCCAL VAC POLYVALENT 25 MCG/0.5ML IJ INJ
0.5000 mL | INJECTION | INTRAMUSCULAR | Status: AC
  Administered 2014-05-04: 0.5 mL via INTRAMUSCULAR
  Filled 2014-05-03: qty 0.5

## 2014-05-03 MED ORDER — SODIUM CHLORIDE 0.9 % IJ SOLN: 3.0000 mL | INTRAMUSCULAR | Status: DC | PRN

## 2014-05-03 MED ORDER — INFLUENZA VAC SPLIT QUAD 0.5 ML IM SUSY
0.5000 mL | PREFILLED_SYRINGE | INTRAMUSCULAR | Status: AC
  Administered 2014-05-04: 0.5 mL via INTRAMUSCULAR
  Filled 2014-05-03: qty 0.5

## 2014-05-03 MED ORDER — HYDROCODONE-ACETAMINOPHEN 5-325 MG PO TABS
1.0000 | ORAL_TABLET | Freq: Four times a day (QID) | ORAL | Status: DC | PRN
  Administered 2014-05-03 – 2014-05-04 (×4): 1 via ORAL
  Filled 2014-05-03 (×4): qty 1

## 2014-05-03 MED ORDER — ONDANSETRON HCL 4 MG PO TABS: 4.0000 mg | ORAL_TABLET | Freq: Four times a day (QID) | ORAL | Status: DC | PRN

## 2014-05-03 MED ORDER — ACETAMINOPHEN 325 MG PO TABS: 650.0000 mg | ORAL_TABLET | Freq: Four times a day (QID) | ORAL | Status: DC | PRN

## 2014-05-03 MED ORDER — LORATADINE 10 MG PO TABS
10.0000 mg | ORAL_TABLET | Freq: Every day | ORAL | Status: DC
  Administered 2014-05-03 – 2014-05-07 (×5): 10 mg via ORAL
  Filled 2014-05-03 (×5): qty 1

## 2014-05-03 MED ORDER — IOHEXOL 350 MG/ML SOLN
100.0000 mL | Freq: Once | INTRAVENOUS | Status: AC | PRN
  Administered 2014-05-03: 100 mL via INTRAVENOUS

## 2014-05-03 MED ORDER — POLYETHYLENE GLYCOL 3350 17 G PO PACK
17.0000 g | PACK | Freq: Every day | ORAL | Status: DC
Start: 2014-05-03 — End: 2014-05-07
  Administered 2014-05-03 – 2014-05-07 (×5): 17 g via ORAL
  Filled 2014-05-03 (×5): qty 1

## 2014-05-03 MED ORDER — LOSARTAN POTASSIUM 50 MG PO TABS
25.0000 mg | ORAL_TABLET | Freq: Every day | ORAL | Status: DC
  Administered 2014-05-03 – 2014-05-07 (×5): 25 mg via ORAL
  Filled 2014-05-03 (×5): qty 1

## 2014-05-03 MED ORDER — WARFARIN SODIUM 2 MG PO TABS
3.0000 mg | ORAL_TABLET | Freq: Once | ORAL | Status: AC
  Administered 2014-05-03: 3 mg via ORAL
  Filled 2014-05-03 (×2): qty 1

## 2014-05-03 MED ORDER — ASPIRIN EC 81 MG PO TBEC
81.0000 mg | DELAYED_RELEASE_TABLET | Freq: Every day | ORAL | Status: DC
  Administered 2014-05-03 – 2014-05-07 (×5): 81 mg via ORAL
  Filled 2014-05-03 (×5): qty 1

## 2014-05-03 MED ORDER — ONDANSETRON HCL 4 MG/2ML IJ SOLN
4.0000 mg | Freq: Once | INTRAMUSCULAR | Status: AC
  Administered 2014-05-03: 4 mg via INTRAVENOUS
  Filled 2014-05-03: qty 2

## 2014-05-03 MED ORDER — INSULIN ASPART 100 UNIT/ML ~~LOC~~ SOLN
0.0000 [IU] | Freq: Three times a day (TID) | SUBCUTANEOUS | Status: DC
  Administered 2014-05-04 – 2014-05-05 (×2): 1 [IU] via SUBCUTANEOUS

## 2014-05-03 MED ORDER — SODIUM CHLORIDE 0.9 % IJ SOLN
3.0000 mL | Freq: Two times a day (BID) | INTRAMUSCULAR | Status: DC
  Administered 2014-05-03 – 2014-05-05 (×3): 3 mL via INTRAVENOUS

## 2014-05-03 MED ORDER — WARFARIN - PHARMACIST DOSING INPATIENT
Status: DC
  Administered 2014-05-07: 16:00:00

## 2014-05-03 MED ORDER — TOPIRAMATE 25 MG PO TABS
50.0000 mg | ORAL_TABLET | Freq: Every day | ORAL | Status: DC
  Administered 2014-05-03 – 2014-05-07 (×5): 50 mg via ORAL
  Filled 2014-05-03 (×6): qty 2

## 2014-05-03 MED ORDER — ONDANSETRON HCL 4 MG/2ML IJ SOLN
4.0000 mg | Freq: Four times a day (QID) | INTRAMUSCULAR | Status: DC | PRN
  Administered 2014-05-07: 4 mg via INTRAVENOUS
  Filled 2014-05-03: qty 2

## 2014-05-03 MED ORDER — LATANOPROST 0.005 % OP SOLN
1.0000 [drp] | Freq: Every day | OPHTHALMIC | Status: DC
  Administered 2014-05-04 – 2014-05-06 (×4): 1 [drp] via OPHTHALMIC
  Filled 2014-05-03: qty 2.5

## 2014-05-03 MED ORDER — MORPHINE SULFATE 4 MG/ML IJ SOLN
4.0000 mg | Freq: Once | INTRAMUSCULAR | Status: AC
  Administered 2014-05-03: 4 mg via INTRAVENOUS
  Filled 2014-05-03: qty 1

## 2014-05-03 MED ORDER — SODIUM CHLORIDE 0.9 % IJ SOLN
3.0000 mL | Freq: Two times a day (BID) | INTRAMUSCULAR | Status: DC
  Administered 2014-05-06 – 2014-05-07 (×2): 3 mL via INTRAVENOUS

## 2014-05-03 MED ORDER — AMITRIPTYLINE HCL 25 MG PO TABS
25.0000 mg | ORAL_TABLET | Freq: Every day | ORAL | Status: DC
  Administered 2014-05-03 – 2014-05-06 (×4): 25 mg via ORAL
  Filled 2014-05-03 (×4): qty 1

## 2014-05-03 MED ORDER — SODIUM CHLORIDE 0.9 % IV SOLN: 250.0000 mL | INTRAVENOUS | Status: DC | PRN

## 2014-05-03 NOTE — Progress Notes (Signed)
ANTICOAGULATION CONSULT NOTE - Initial Consult  Pharmacy Consult for Coumadin Indication: hx DVT  Allergies  Allergen Reactions  . Vioxx [Rofecoxib] Shortness Of Breath  . Penicillins     rash  . Codeine Rash  . Motrin [Ibuprofen] Rash  . Sulfur Rash    Patient Measurements: Height: 5\' 2"  (157.5 cm) Weight: 163 lb 2.3 oz (74 kg) IBW/kg (Calculated) : 50.1  Vital Signs: Temp: 98 F (36.7 C) (10/30 1328) Temp Source: Oral (10/30 1328) BP: 121/75 mmHg (10/30 1328) Pulse Rate: 62 (10/30 1328)  Labs:  Recent Labs  05/03/14 0852  HGB 11.2*  HCT 35.4*  PLT 297  LABPROT 20.2*  INR 1.71*  CREATININE 1.16*  TROPONINI <0.30    Estimated Creatinine Clearance: 40.7 ml/min (by C-G formula based on Cr of 1.16).   Medical History: Past Medical History  Diagnosis Date  . Hypertension   . Diabetes mellitus     x 5 yrs  . Hypothyroidism   . DVT of axillary vein, acute left 07/24/12  . Back pain   . Vertigo     chonic  . Mixed hyperlipidemia   . GERD (gastroesophageal reflux disease)   . Glaucoma   . Sigmoid diverticulitis   . Asthmatic bronchitis   . Pinched nerve     right elbow  . Chronic kidney disease     kidney function low    Medications:  Prescriptions prior to admission  Medication Sig Dispense Refill  . amitriptyline (ELAVIL) 25 MG tablet Take 25 mg by mouth at bedtime.      . cetirizine (ZYRTEC) 10 MG tablet Take 10 mg by mouth daily.      [name] esomeprazole (NEXIUM) 20 MG capsule Take 20 mg by mouth daily at 12 noon.      . furosemide (LASIX) 20 MG tablet Take 20 mg by mouth daily as needed (FOR FLUID RETENTION).      [name] latanoprost (XALATAN) 0.005 % ophthalmic solution 1 drop at bedtime.      [name] losartan (COZAAR) 50 MG tablet Take 25 mg by mouth daily.      . meclizine (ANTIVERT) 25 MG tablet Take 25 mg by mouth as needed for dizziness.       . Omega-3 Fatty Acids (FISH OIL) 1200 MG CAPS Take 1 capsule by mouth 2 (two) times daily.      . polyethylene  glycol (MIRALAX / GLYCOLAX) packet Take 17 g by mouth daily.      . potassium chloride (K-DUR) 10 MEQ tablet Take 10 mEq by mouth daily. *TO BE TAKEN WITH LASIX (FUROSEMIDE)      . sitaGLIPtin (JANUVIA) 50 MG tablet Take 50 mg by mouth as needed (high blood sugar).       . topiramate (TOPAMAX) 25 MG tablet Take 50 mg by mouth daily.       [name] warfarin (COUMADIN) 3 MG tablet Take 1.5-3 mg by mouth daily. Alternating 1.5 mg and 3 mg every other day.        Assessment: 79 yoF admitted with chest pain on chronic warfarin for hx DVT.  INR slightly below goal on admission.  Home dose noted above.  No bleeding noted.   Goal of Therapy:  INR 2-3   Plan:  Coumadin 3mg  po x1 today Daily INR  Gavriela Cashin, [place] Drafts 05/03/2014,5:10 PM

## 2014-05-03 NOTE — ED Provider Notes (Signed)
Medical screening examination/treatment/procedure(s) were conducted as a shared visit with non-physician practitioner(s) and myself.  I personally evaluated the patient during the encounter.   EKG Interpretation   Date/Time:  Friday May 03 2014 08:43:57 EDT Ventricular Rate:  66 PR Interval:  176 QRS Duration: 154 QT Interval:  434 QTC Calculation: 455 R Axis:   112 Text Interpretation:  Sinus rhythm RBBB and LPFB Confirmed by Kimyata Milich  MD,  Suzanna Zahn ([id number]) on 05/03/2014 9:04:58 AM     Chest pain c risk factors.  Initial w/u neg.  Admit for r/o  Nat Christen, MD 05/03/14 [phone]

## 2014-05-03 NOTE — ED Notes (Signed)
PA at bedside.

## 2014-05-03 NOTE — H&P (Addendum)
History and Physical  [name] LDJ:[id number] DOB: Nov 02, 1940 DOA: 05/03/2014  Referring physician: Aleene Davidson, NP PCP: Purvis Kilts, MD   Chief Complaint: chest pain  HPI:  73yow presented to ED with >5 day h/o right-sided chest pain, RUQ pain with radiation to back. Initial evaluation unremarkable, referred for CP evaluation  Patient reports onset of RUQ pain/right-sided chest pain 5 days ago, constant in nature, wax and wanes in intensity, at most intense very painful. Associated SOB at times but not always. Not provoked by exertion. Intensity paroxysmal in nature. No specific aggravating or alleviating factors. No fever or systemic symptoms. Hx of chole. Eating well, normal BM, no vomiting. Does have h/o kidney stones.  Daughter adds that patient is stressed by husband undergoing AAA repair recently.  In the emergency department afebrile, VSS. CMP, CBC unremarkable. Troponin negative. D-dimer negative. INR 1.71. CT chest no PE. EKG SR, RBBB, LAFB, no previous available for comparison.  Review of Systems:  Negative for fever, visual changes, sore throat, rash, new muscle aches,  dysuria, bleeding, v/abdominal pain.  Past Medical History  Diagnosis Date  . Hypertension   . Diabetes mellitus     x 5 yrs  . Hypothyroidism   . DVT of axillary vein, acute left 07/24/12  . Back pain   . Vertigo     chonic  . Mixed hyperlipidemia   . GERD (gastroesophageal reflux disease)   . Glaucoma   . Sigmoid diverticulitis   . Asthmatic bronchitis   . Pinched nerve     right elbow  . Chronic kidney disease     kidney function low    Past Surgical History  Procedure Laterality Date  . Other surgical history      colostomy, colostomy reversal, for diverticulitis surgical hernia repair, arm surgery, neck surgery  . Abdominal hysterectomy    . Fracture left foot    . Eye surgery    . Back surgery      spinal   . Cholecystectomy    . Colon surgery    . Hernia repair      . Colostomy closure    . Colonoscopy N/A 12/27/2013    Procedure: COLONOSCOPY;  Surgeon: Rogene Houston, MD;  Location: [facility];  Service: Endoscopy;  Laterality: N/A;  200    Social History:  reports that she quit smoking about 4 months ago. Her smoking use included Cigarettes. She smoked 0.00 packs per day. She has never used smokeless tobacco. She reports that she does not drink alcohol or use illicit drugs.  Allergies  Allergen Reactions  . Vioxx [Rofecoxib] Shortness Of Breath  . Penicillins     rash  . Codeine Rash  . Motrin [Ibuprofen] Rash  . Sulfur Rash    Family History  Problem Relation Age of Onset  . Other Mother 67    Cause unknown  . Cancer Mother     liver  . CVA Maternal Grandmother 90    deceased  . Heart disease Maternal Grandmother   . Stroke Maternal Grandmother   . Heart attack Brother 4    deceased  . Cancer Sister 87    deceased     Prior to Admission medications   Medication Sig Start Date End Date Taking? Authorizing Provider  amitriptyline (ELAVIL) 25 MG tablet Take 25 mg by mouth at bedtime.   Yes Historical Provider, MD  cetirizine (ZYRTEC) 10 MG tablet Take 10 mg by mouth daily.   Yes Historical Provider,  MD  esomeprazole (NEXIUM) 20 MG capsule Take 20 mg by mouth daily at 12 noon.   Yes Historical Provider, MD  furosemide (LASIX) 20 MG tablet Take 20 mg by mouth daily as needed (FOR FLUID RETENTION).   Yes Historical Provider, MD  latanoprost (XALATAN) 0.005 % ophthalmic solution 1 drop at bedtime.   Yes Historical Provider, MD  losartan (COZAAR) 50 MG tablet Take 25 mg by mouth daily.   Yes Historical Provider, MD  meclizine (ANTIVERT) 25 MG tablet Take 25 mg by mouth as needed for dizziness.    Yes Historical Provider, MD  Omega-3 Fatty Acids (FISH OIL) 1200 MG CAPS Take 1 capsule by mouth 2 (two) times daily.   Yes Historical Provider, MD  polyethylene glycol (MIRALAX / GLYCOLAX) packet Take 17 g by mouth daily.   Yes Historical  Provider, MD  potassium chloride (K-DUR) 10 MEQ tablet Take 10 mEq by mouth daily. *TO BE TAKEN WITH LASIX (FUROSEMIDE)   Yes Historical Provider, MD  sitaGLIPtin (JANUVIA) 50 MG tablet Take 50 mg by mouth as needed (high blood sugar).    Yes Historical Provider, MD  topiramate (TOPAMAX) 25 MG tablet Take 50 mg by mouth daily.    Yes Historical Provider, MD  warfarin (COUMADIN) 3 MG tablet Take 1.5-3 mg by mouth daily. Alternating 1.5 mg and 3 mg every other day.   Yes Historical Provider, MD   Physical Exam: Filed Vitals:   05/03/14 0847 05/03/14 0900 05/03/14 0930 05/03/14 1100  BP: 149/71 155/73 138/69 135/67  Pulse: 75 73 71 73  Temp: 97.9 F (36.6 C)     TempSrc: Oral     Resp: 16 14 14    Height: 5\' 3"  (1.6 m)     Weight: 72.576 kg (160 lb)     SpO2: 100% 100% 98% 96%   General: examined in ED. Appears calm and mildly uncomfortable Eyes: PERRL, normal lids, irises  ENT: grossly normal hearing, lips & tongue Neck: no LAD, masses or thyromegaly Cardiovascular: RRR, no m/r/g. No LE edema. Respiratory: CTA bilaterally, no w/r/r. Normal respiratory effort. Abdomen: soft, nd; exquisite point tenderness below costal margin mid clavicular line on the right. No rash. No rib pain. No defects noted. Abdomen otherwise soft and non-tender, no rebound or guarding. After palpitation patient with paroxysm of pain. Skin: no rash or induration seen  Musculoskeletal: grossly normal tone BUE/BLE Psychiatric: grossly normal mood and affect, speech fluent and appropriate Neurologic: grossly non-focal.  Wt Readings from Last 3 Encounters:  05/03/14 72.576 kg (160 lb)  12/27/13 72.576 kg (160 lb)  12/27/13 72.576 kg (160 lb)    Labs on Admission:  Basic Metabolic Panel:  Recent Labs Lab 05/03/14 0852  NA 143  K 3.9  CL 108  CO2 25  GLUCOSE 101*  BUN 14  CREATININE 1.16*  CALCIUM 9.5    Liver Function Tests:  Recent Labs Lab 05/03/14 0852  AST 19  ALT 18  ALKPHOS 96  BILITOT  0.2*  PROT 7.2  ALBUMIN 3.6    Recent Labs Lab 05/03/14 0852  LIPASE 54    CBC:  Recent Labs Lab 05/03/14 0852  WBC 6.1  NEUTROABS 3.6  HGB 11.2*  HCT 35.4*  MCV 87.0  PLT 297    Cardiac Enzymes:  Recent Labs Lab 05/03/14 0852  TROPONINI <0.30    Radiological Exams on Admission: Dg Chest 2 View  05/03/2014   CLINICAL DATA:  Chest pain under RIGHT breast through to back since Sunday, history  asthma, hypertension, diabetes mellitus, GERD, former smoker, initial encounter  EXAM: CHEST  2 VIEW  COMPARISON:  09/24/2010  FINDINGS: Upper normal heart size.  Normal mediastinal contours and pulmonary vascularity.  Emphysematous and bronchitic changes consistent with COPD.  Atherosclerotic calcification aortic arch.  Minimal atelectasis at LEFT base.  No acute infiltrate, pleural effusion or pneumothorax.  Bones demineralized with evidence of prior cervical spine fusion.  IMPRESSION: COPD changes with minimal LEFT basilar atelectasis.   Electronically Signed   By: Lavonia Dana M.D.   On: 05/03/2014 10:21   Ct Angio Chest Pe W/cm &/or Wo Cm  05/03/2014   CLINICAL DATA:  73 year old female with right anterior chest pain radiating to the back since 04/28/2014. Initial encounter.  EXAM: CT ANGIOGRAPHY CHEST WITH CONTRAST  TECHNIQUE: Multidetector CT imaging of the chest was performed using the standard protocol during bolus administration of intravenous contrast. Multiplanar CT image reconstructions and MIPs were obtained to evaluate the vascular anatomy.  CONTRAST:  142mL OMNIPAQUE IOHEXOL 350 MG/ML SOLN  COMPARISON:  Chest CT 06/15/2005.  CT Abdomen and Pelvis 08/19/2012  FINDINGS: Good contrast bolus timing in the pulmonary arterial tree. Mild motion artifact at the lung bases. No focal filling defect identified in the pulmonary arterial tree to suggest the presence of acute pulmonary embolism.  Mildly lower lung volumes. Major airways are patent except for atelectatic changes. Dependent  ground-glass and mildly confluent opacity compatible with atelectasis. No pleural effusion. Increased extrapleural fat. No consolidation or other focal pulmonary opacity.  Mediastinal lipomatosis. No pericardial effusion. Visible aorta is patent with moderate atherosclerosis. No definite calcified coronary plaque. Small anterior carina and other mediastinal lymph nodes are stable since 2006. Negative thoracic inlet. No axillary lymphadenopathy.  Surgically absent gallbladder. Negative visualized liver, spleen, pancreas, adrenal glands, left renal upper pole, and bowel in the upper abdomen.  Osteopenia. No acute osseous abnormality identified.  Review of the MIP images confirms the above findings.  IMPRESSION: 1. No evidence of acute pulmonary embolus. 2. Atelectasis and aortic atherosclerosis.   Electronically Signed   By: Lars Pinks M.D.   On: 05/03/2014 10:54     Principal Problem:   RUQ pain Active Problems:   DM (diabetes mellitus)   Chest pain   Assessment/Plan 1. Right-sided chest pain/RUQ pain with RBBB unknown chronicty. Present 5 days, atypical in location and character, normal troponin on admission, ACS doubted. Favor atypical etiology, possibly musculoskeletal. Point tenderness would not suggest renal stone and normal appetitie, BM and no vomiting do not suggest any acute abdominal process. H/o cholecystectomy with normal LFTs and lipase. Stress may be contributing. RBBB noted, chronicty unknown but doubt recent. Requested EKG from Dr. Johnnette Gourd office (previously [facility]) 2. DM type 2 3. HTN 4. GERD 5. CKD 6. Chronic normocytic anemia  7. H/o DVT   Plan observation, will cycle troponins, check abdominal RUQ u/s; if doesn't improve consider CT to assess for renal colic. Patient states no allergy to ASA, can tolerate.  SSI  Discussed with daughter at bedside  Warfarin per pharmacy  Code Status: full code  DVT prophylaxis:on warfarin Family Communication:  Disposition  Plan/Anticipated LOS: obs, 1-2 days  Time spent: 48 minutes  Murray Hodgkins, MD  [facility] Pager [phone] 05/03/2014, 12:10 PM

## 2014-05-03 NOTE — ED Notes (Signed)
Pain started Sunday. Right chest moving under right chest into back. Worse with movement. States several weeks go had a sinus infection, denies cough at this time. Denies SOB, lightheadedness, or dizziness.

## 2014-05-03 NOTE — ED Provider Notes (Signed)
CSN: [id number]     Arrival date & time 05/03/14  0827 History   First MD Initiated Contact with Patient 05/03/14 0840     Chief Complaint  Patient presents with  . Chest Pain     (Consider location/radiation/quality/duration/timing/severity/associated sxs/prior Treatment) HPI Comments: Pt c/o right sided chest pain with radiation to her back times 5 days. Pt states that it is worse with movement. Nausea without vomiting. States that the pain makes her feel sob. No dizziness or lightheadedness. No diaphoresis. State that she sees cardiology but is not sure why. She has a stress test last year that was normal. Pain is worse with ambulation. No cough or fever.  The history is provided by the patient. No language interpreter was used.    Past Medical History  Diagnosis Date  . Hypertension   . Diabetes mellitus     x 5 yrs  . Thyroid disease   . DVT of axillary vein, acute left 07/24/12  . Back pain   . Vertigo     chonic  . Mixed hyperlipidemia   . GERD (gastroesophageal reflux disease)   . Glaucoma   . Sigmoid diverticulitis   . Asthmatic bronchitis   . Pinched nerve     right elbow  . Chronic kidney disease     kidney function low   Past Surgical History  Procedure Laterality Date  . Other surgical history      colostomy, colostomy reversal, for diverticulitis surgical hernia repair, arm surgery, neck surgery  . Abdominal hysterectomy    . Fracture left foot    . Eye surgery    . Back surgery      spinal   . Cholecystectomy    . Colon surgery    . Hernia repair    . Colostomy closure    . Colonoscopy N/A 12/27/2013    Procedure: COLONOSCOPY;  Surgeon: Rogene Houston, MD;  Location: [facility];  Service: Endoscopy;  Laterality: N/A;  200   Family History  Problem Relation Age of Onset  . Other Mother 58    Cause unknown  . Cancer Mother     liver  . CVA Maternal Grandmother 90    deceased  . Heart disease Maternal Grandmother   . Stroke Maternal  Grandmother   . Heart attack Brother 73    deceased  . Cancer Sister 31    deceased   History  Substance Use Topics  . Smoking status: Former Smoker    Types: Cigarettes    Quit date: 12/10/2013  . Smokeless tobacco: Never Used     Comment: Smoke 1-1 1/2 packs a day  . Alcohol Use: No   OB History   Grav Para Term Preterm Abortions TAB SAB Ect Mult Living   3 3             Review of Systems  All other systems reviewed and are negative.     Allergies  Vioxx; Penicillins; Codeine; Motrin; and Sulfur  Home Medications   Prior to Admission medications   Medication Sig Start Date End Date Taking? Authorizing Provider  amitriptyline (ELAVIL) 25 MG tablet Take 25 mg by mouth at bedtime.   Yes Historical Provider, MD  cetirizine (ZYRTEC) 10 MG tablet Take 10 mg by mouth daily.   Yes Historical Provider, MD  esomeprazole (NEXIUM) 20 MG capsule Take 20 mg by mouth daily at 12 noon.   Yes Historical Provider, MD  furosemide (LASIX) 20 MG tablet Take 20  mg by mouth daily as needed (FOR FLUID RETENTION).   Yes Historical Provider, MD  latanoprost (XALATAN) 0.005 % ophthalmic solution 1 drop at bedtime.   Yes Historical Provider, MD  meclizine (ANTIVERT) 25 MG tablet Take 25 mg by mouth as needed for dizziness.    Yes Historical Provider, MD  Omega-3 Fatty Acids (FISH OIL) 1200 MG CAPS Take 1 capsule by mouth 2 (two) times daily.   Yes Historical Provider, MD  polyethylene glycol (MIRALAX / GLYCOLAX) packet Take 17 g by mouth daily.   Yes Historical Provider, MD  potassium chloride (K-DUR) 10 MEQ tablet Take 10 mEq by mouth daily. *TO BE TAKEN WITH LASIX (FUROSEMIDE)   Yes Historical Provider, MD  sitaGLIPtin (JANUVIA) 50 MG tablet Take 50 mg by mouth as needed (high blood sugar).    Yes Historical Provider, MD  topiramate (TOPAMAX) 25 MG tablet Take 50 mg by mouth daily.    Yes Historical Provider, MD  warfarin (COUMADIN) 3 MG tablet Take 1.5-3 mg by mouth daily. Alternating 1.5 mg and  3 mg every other day.   Yes Historical Provider, MD   BP 149/71  Pulse 75  Temp(Src) 97.9 F (36.6 C) (Oral)  Resp 16  Ht 5\' 3"  (1.6 m)  Wt 160 lb (72.576 kg)  BMI 28.35 kg/m2  SpO2 100% Physical Exam  Nursing note and vitals reviewed. Constitutional: She is oriented to person, place, and time. She appears well-developed and well-nourished.  HENT:  Head: Normocephalic and atraumatic.  Cardiovascular: Normal rate and regular rhythm.   Pulmonary/Chest: Effort normal and breath sounds normal.  Tenderness with palpation  Abdominal: Soft. Bowel sounds are normal. There is no tenderness.  Musculoskeletal: Normal range of motion.  Neurological: She is alert and oriented to person, place, and time.  Skin: Skin is warm and dry.  Psychiatric: She has a normal mood and affect.    ED Course  Procedures (including critical care time) Labs Review Labs Reviewed  CBC WITH DIFFERENTIAL - Abnormal; Notable for the following:    Hemoglobin 11.2 (*)    HCT 35.4 (*)    All other components within normal limits  COMPREHENSIVE METABOLIC PANEL  LIPASE, BLOOD  D-DIMER, QUANTITATIVE  TROPONIN I    Imaging Review Dg Chest 2 View  05/03/2014   CLINICAL DATA:  Chest pain under RIGHT breast through to back since Sunday, history asthma, hypertension, diabetes mellitus, GERD, former smoker, initial encounter  EXAM: CHEST  2 VIEW  COMPARISON:  09/24/2010  FINDINGS: Upper normal heart size.  Normal mediastinal contours and pulmonary vascularity.  Emphysematous and bronchitic changes consistent with COPD.  Atherosclerotic calcification aortic arch.  Minimal atelectasis at LEFT base.  No acute infiltrate, pleural effusion or pneumothorax.  Bones demineralized with evidence of prior cervical spine fusion.  IMPRESSION: COPD changes with minimal LEFT basilar atelectasis.   Electronically Signed   By: Lavonia Dana M.D.   On: 05/03/2014 10:21   Ct Angio Chest Pe W/cm &/or Wo Cm  05/03/2014   CLINICAL DATA:   73 year old female with right anterior chest pain radiating to the back since 04/28/2014. Initial encounter.  EXAM: CT ANGIOGRAPHY CHEST WITH CONTRAST  TECHNIQUE: Multidetector CT imaging of the chest was performed using the standard protocol during bolus administration of intravenous contrast. Multiplanar CT image reconstructions and MIPs were obtained to evaluate the vascular anatomy.  CONTRAST:  135mL OMNIPAQUE IOHEXOL 350 MG/ML SOLN  COMPARISON:  Chest CT 06/15/2005.  CT Abdomen and Pelvis 08/19/2012  FINDINGS: Good contrast  bolus timing in the pulmonary arterial tree. Mild motion artifact at the lung bases. No focal filling defect identified in the pulmonary arterial tree to suggest the presence of acute pulmonary embolism.  Mildly lower lung volumes. Major airways are patent except for atelectatic changes. Dependent ground-glass and mildly confluent opacity compatible with atelectasis. No pleural effusion. Increased extrapleural fat. No consolidation or other focal pulmonary opacity.  Mediastinal lipomatosis. No pericardial effusion. Visible aorta is patent with moderate atherosclerosis. No definite calcified coronary plaque. Small anterior carina and other mediastinal lymph nodes are stable since 2006. Negative thoracic inlet. No axillary lymphadenopathy.  Surgically absent gallbladder. Negative visualized liver, spleen, pancreas, adrenal glands, left renal upper pole, and bowel in the upper abdomen.  Osteopenia. No acute osseous abnormality identified.  Review of the MIP images confirms the above findings.  IMPRESSION: 1. No evidence of acute pulmonary embolus. 2. Atelectasis and aortic atherosclerosis.   Electronically Signed   By: Lars Pinks M.D.   On: 05/03/2014 10:54     EKG Interpretation   Date/Time:  Friday May 03 2014 08:43:57 EDT Ventricular Rate:  66 PR Interval:  176 QRS Duration: 154 QT Interval:  434 QTC Calculation: 455 R Axis:   112 Text Interpretation:  Sinus rhythm RBBB and  LPFB Confirmed by COOK  MD,  BRIAN ([id number]) on 05/03/2014 9:04:58 AM      MDM   Final diagnoses:  Chest pain  Right-sided chest pain    Pt to be admitted for a cp rule out. No previous ekg to not if rbbb is new. Pt pain somewhat better with morphine.ct negative for pe. With risk factors think pt needs to come in    [place], NP 05/03/14 1217

## 2014-05-04 ENCOUNTER — Other Ambulatory Visit (HOSPITAL_COMMUNITY): Payer: Medicare Other

## 2014-05-04 ENCOUNTER — Observation Stay (HOSPITAL_COMMUNITY): Payer: Medicare Other

## 2014-05-04 DIAGNOSIS — Z885 Allergy status to narcotic agent status: Secondary | ICD-10-CM | POA: Diagnosis not present

## 2014-05-04 DIAGNOSIS — Z86718 Personal history of other venous thrombosis and embolism: Secondary | ICD-10-CM | POA: Diagnosis not present

## 2014-05-04 DIAGNOSIS — E119 Type 2 diabetes mellitus without complications: Secondary | ICD-10-CM | POA: Diagnosis not present

## 2014-05-04 DIAGNOSIS — Z87891 Personal history of nicotine dependence: Secondary | ICD-10-CM | POA: Diagnosis not present

## 2014-05-04 DIAGNOSIS — E039 Hypothyroidism, unspecified: Secondary | ICD-10-CM | POA: Diagnosis not present

## 2014-05-04 DIAGNOSIS — H409 Unspecified glaucoma: Secondary | ICD-10-CM | POA: Diagnosis not present

## 2014-05-04 DIAGNOSIS — E782 Mixed hyperlipidemia: Secondary | ICD-10-CM | POA: Diagnosis not present

## 2014-05-04 DIAGNOSIS — Z882 Allergy status to sulfonamides status: Secondary | ICD-10-CM | POA: Diagnosis not present

## 2014-05-04 DIAGNOSIS — K219 Gastro-esophageal reflux disease without esophagitis: Secondary | ICD-10-CM | POA: Diagnosis not present

## 2014-05-04 DIAGNOSIS — Z79899 Other long term (current) drug therapy: Secondary | ICD-10-CM | POA: Diagnosis not present

## 2014-05-04 DIAGNOSIS — K2960 Other gastritis without bleeding: Secondary | ICD-10-CM | POA: Diagnosis not present

## 2014-05-04 DIAGNOSIS — Z880 Allergy status to penicillin: Secondary | ICD-10-CM | POA: Diagnosis not present

## 2014-05-04 DIAGNOSIS — R079 Chest pain, unspecified: Secondary | ICD-10-CM | POA: Diagnosis present

## 2014-05-04 DIAGNOSIS — N179 Acute kidney failure, unspecified: Secondary | ICD-10-CM | POA: Diagnosis not present

## 2014-05-04 DIAGNOSIS — N189 Chronic kidney disease, unspecified: Secondary | ICD-10-CM | POA: Diagnosis not present

## 2014-05-04 DIAGNOSIS — D649 Anemia, unspecified: Secondary | ICD-10-CM | POA: Diagnosis not present

## 2014-05-04 DIAGNOSIS — Z886 Allergy status to analgesic agent status: Secondary | ICD-10-CM | POA: Diagnosis not present

## 2014-05-04 DIAGNOSIS — R1011 Right upper quadrant pain: Secondary | ICD-10-CM | POA: Diagnosis not present

## 2014-05-04 DIAGNOSIS — I129 Hypertensive chronic kidney disease with stage 1 through stage 4 chronic kidney disease, or unspecified chronic kidney disease: Secondary | ICD-10-CM | POA: Diagnosis not present

## 2014-05-04 LAB — GLUCOSE, CAPILLARY
GLUCOSE-CAPILLARY: 89 mg/dL (ref 70–99)
Glucose-Capillary: 108 mg/dL — ABNORMAL HIGH (ref 70–99)
Glucose-Capillary: 139 mg/dL — ABNORMAL HIGH (ref 70–99)
Glucose-Capillary: 120 mg/dL — ABNORMAL HIGH (ref 70–99)

## 2014-05-04 LAB — COMPREHENSIVE METABOLIC PANEL
ALK PHOS: 85 U/L (ref 39–117)
ALT: 23 U/L (ref 0–35)
ANION GAP: 10 (ref 5–15)
AST: 28 U/L (ref 0–37)
Albumin: 3.1 g/dL — ABNORMAL LOW (ref 3.5–5.2)
BUN: 12 mg/dL (ref 6–23)
CALCIUM: 9.2 mg/dL (ref 8.4–10.5)
CO2: 25 mEq/L (ref 19–32)
CREATININE: 1.26 mg/dL — AB (ref 0.50–1.10)
Chloride: 111 mEq/L (ref 96–112)
GFR calc non Af Amer: 41 mL/min — ABNORMAL LOW (ref >90)
GFR, EST AFRICAN AMERICAN: 48 mL/min — AB (ref >90)
GLUCOSE: 94 mg/dL (ref 70–99)
Potassium: 4.2 mEq/L (ref 3.7–5.3)
Sodium: 146 mEq/L (ref 137–147)
TOTAL PROTEIN: 6.3 g/dL (ref 6.0–8.3)
Total Bilirubin: 0.3 mg/dL (ref 0.3–1.2)

## 2014-05-04 LAB — CBC
HEMATOCRIT: 32.1 % — AB (ref 36.0–46.0)
HEMOGLOBIN: 10.3 g/dL — AB (ref 12.0–15.0)
MCH: 27.9 pg (ref 26.0–34.0)
MCHC: 32.1 g/dL (ref 30.0–36.0)
MCV: 87 fL (ref 78.0–100.0)
Platelets: 245 10*3/uL (ref 150–400)
RBC: 3.69 MIL/uL — ABNORMAL LOW (ref 3.87–5.11)
RDW: 13.9 % (ref 11.5–15.5)
WBC: 5.1 10*3/uL (ref 4.0–10.5)

## 2014-05-04 LAB — PROTIME-INR
INR: 2.08 — AB (ref 0.00–1.49)
PROTHROMBIN TIME: 23.5 s — AB (ref 11.6–15.2)

## 2014-05-04 LAB — TROPONIN I: Troponin I: <0.3 ng/mL (ref <0.30)

## 2014-05-04 MED ORDER — HYDROCODONE-ACETAMINOPHEN 5-325 MG PO TABS
1.0000 | ORAL_TABLET | ORAL | Status: DC | PRN
  Administered 2014-05-04 – 2014-05-07 (×9): 1 via ORAL
  Filled 2014-05-04 (×9): qty 1

## 2014-05-04 MED ORDER — WARFARIN SODIUM 1 MG PO TABS
1.5000 mg | ORAL_TABLET | Freq: Once | ORAL | Status: AC
  Administered 2014-05-04: 1.5 mg via ORAL
  Filled 2014-05-04: qty 2

## 2014-05-04 NOTE — Progress Notes (Signed)
PROGRESS NOTE  ALEXSUS PAPADOPOULOS MGQ:[id number] DOB: January 07, 1941 DOA: 05/03/2014 PCP: Purvis Kilts, MD  Summary: 73yow presented to ED with >5 day h/o right-sided chest pain, RUQ pain with radiation to back. Initial evaluation unremarkable, referred for CP evaluation.  Assessment/Plan: 1. RUQ improved, etiology remains unclear. Has ruled out for ACS. LFTs normal, lipase normal, no GI symptoms, tolerating diet, normal BM. AXR and RUQ ultrasound unremarkable (post-chole). No suspicion for renal colic. Atypical, possibly related to stress with husband. 2. Reported CP on admission. Resolved. On my evaluation was RUQ not chest pain. Nevertheless ruled out for ACS. RBBB of unknown chronicity. I spoke to staff 10/30 at (formerly) [facility] and requested last EKG, office note which was supposed to have been faxed, but was not received. Based on atypical symptoms and workup this far would not suggest any further workup. 3. DM type 2, CBG stable. 4. CKD stage III, stable. 5. Chronic normocytic anemia, stable.  6. H/o DVT. Chest CT on admission negative for PE. Continue warfarin.   Seems to be spontaneously improving; no evidence of severe illness. Plan observation, possibly home later today vs in AM  Code Status: full code DVT prophylaxis: warfarin Family Communication: none present Disposition Plan:   Murray Hodgkins, MD  [facility]  Pager [phone] If 7PM-7AM, please contact night-coverage at [email or web address], password [facility] 05/04/2014, 7:28 AM  LOS: 1 day   Consultants:    Procedures:    Antibiotics:    HPI/Subjective: No issues overnight.  Feeling better today, less RUQ pain, eating fine. No new issues.  Objective: Filed Vitals:   05/03/14 1230 05/03/14 1328 05/03/14 2117 05/04/14 0529  BP: 138/114 121/75 113/60 115/48  Pulse: 77 62 60 54  Temp:  98 F (36.7 C) 98.3 F (36.8 C) 97.7 F (36.5 C)  TempSrc:  Oral Oral Oral  Resp: 19 20 18 16   Height:  5\' 2"  (1.575 m)     Weight:  74 kg (163 lb 2.3 oz)    SpO2: 91% 100% 96% 97%    Intake/Output Summary (Last 24 hours) at 05/04/14 0728 Last data filed at 05/04/14 0518  Gross per 24 hour  Intake      0 ml  Output   1800 ml  Net  -1800 ml     Filed Weights   05/03/14 0847 05/03/14 1328  Weight: 72.576 kg (160 lb) 74 kg (163 lb 2.3 oz)    Exam:     Afebrile, VSS, no hypoxia  General: appears calm, comfortable  Psych: alert, speech clear  CV: RRR no m/r/g. Telemetry SR.   Respiratory: CTA bilaterally no w/r/r. Normal respiratory effort.  Abdomen: soft, non-distended, minimal RUQ pain with palpation, no rib pain. No flank pain bilaterally. Remainder of abdomen non-tender.  Skin: RUQ appears unremarkable  Data Reviewed:  UOP 1800  troponins negative  CMP unremarkable  INR 2.08  RUQ ultrasound unremarkable  Scheduled Meds: . amitriptyline  25 mg Oral QHS  . aspirin EC  81 mg Oral Daily  . Influenza vac split quadrivalent PF  0.5 mL Intramuscular Tomorrow-1000  . insulin aspart  0-9 Units Subcutaneous TID WC  . latanoprost  1 drop Both Eyes QHS  . loratadine  10 mg Oral Daily  . losartan  25 mg Oral Daily  . pantoprazole  40 mg Oral Daily  . pneumococcal 23 valent vaccine  0.5 mL Intramuscular Tomorrow-1000  . polyethylene glycol  17 g Oral Daily  . sodium chloride  3 mL Intravenous Q12H  .  sodium chloride  3 mL Intravenous Q12H  . topiramate  50 mg Oral Daily  . Warfarin - Pharmacist Dosing Inpatient   Does not apply Q24H   Continuous Infusions:   Principal Problem:   RUQ pain Active Problems:   DM (diabetes mellitus)   Chest pain   Time spent 20 minutes

## 2014-05-04 NOTE — Progress Notes (Signed)
ANTICOAGULATION CONSULT NOTE -  Pharmacy Consult for Coumadin Indication: hx DVT  Allergies  Allergen Reactions  . Vioxx [Rofecoxib] Shortness Of Breath  . Penicillins     rash  . Codeine Rash  . Motrin [Ibuprofen] Rash  . Sulfur Rash    Patient Measurements: Height: 5\' 2"  (157.5 cm) Weight: 163 lb 2.3 oz (74 kg) IBW/kg (Calculated) : 50.1  Vital Signs: Temp: 97.7 F (36.5 C) (10/31 0529) Temp Source: Oral (10/31 0529) BP: 115/48 mmHg (10/31 0529) Pulse Rate: 54 (10/31 0529)  Labs:  Recent Labs  05/03/14 0852 05/03/14 1712 05/03/14 2218 05/04/14 0453  HGB 11.2*  --   --  10.3*  HCT 35.4*  --   --  32.1*  PLT 297  --   --  245  LABPROT 20.2*  --   --  23.5*  INR 1.71*  --   --  2.08*  CREATININE 1.16*  --   --  1.26*  TROPONINI <0.30 <0.30 <0.30 <0.30    Estimated Creatinine Clearance: 37.5 ml/min (by C-G formula based on Cr of 1.26).   Medical History: Past Medical History  Diagnosis Date  . Hypertension   . Diabetes mellitus     x 5 yrs  . Hypothyroidism   . DVT of axillary vein, acute left 07/24/12  . Back pain   . Vertigo     chonic  . Mixed hyperlipidemia   . GERD (gastroesophageal reflux disease)   . Glaucoma   . Sigmoid diverticulitis   . Asthmatic bronchitis   . Pinched nerve     right elbow  . Chronic kidney disease     kidney function low    Medications:  Prescriptions prior to admission  Medication Sig Dispense Refill  . amitriptyline (ELAVIL) 25 MG tablet Take 25 mg by mouth at bedtime.      . cetirizine (ZYRTEC) 10 MG tablet Take 10 mg by mouth daily.      [name] esomeprazole (NEXIUM) 20 MG capsule Take 20 mg by mouth daily at 12 noon.      . furosemide (LASIX) 20 MG tablet Take 20 mg by mouth daily as needed (FOR FLUID RETENTION).      [name] latanoprost (XALATAN) 0.005 % ophthalmic solution 1 drop at bedtime.      [name] losartan (COZAAR) 50 MG tablet Take 25 mg by mouth daily.      . meclizine (ANTIVERT) 25 MG tablet Take 25 mg by mouth as  needed for dizziness.       . Omega-3 Fatty Acids (FISH OIL) 1200 MG CAPS Take 1 capsule by mouth 2 (two) times daily.      . polyethylene glycol (MIRALAX / GLYCOLAX) packet Take 17 g by mouth daily.      . potassium chloride (K-DUR) 10 MEQ tablet Take 10 mEq by mouth daily. *TO BE TAKEN WITH LASIX (FUROSEMIDE)      . sitaGLIPtin (JANUVIA) 50 MG tablet Take 50 mg by mouth as needed (high blood sugar).       . topiramate (TOPAMAX) 25 MG tablet Take 50 mg by mouth daily.       [name] warfarin (COUMADIN) 3 MG tablet Take 1.5-3 mg by mouth daily. Alternating 1.5 mg and 3 mg every other day.        Assessment: 65 yoF admitted with chest pain on chronic warfarin for hx DVT.  INR slightly below goal on admission.  Home dose noted above.  No bleeding noted. INR therapeutic today  Goal of Therapy:  INR 2-3   Plan:  Coumadin 1.5 mg po x1 today (home regiment) Daily INR  Abner Greenspan, Andon Villard Bennett 05/04/2014,8:30 AM

## 2014-05-04 NOTE — Progress Notes (Signed)
UR completed 

## 2014-05-05 ENCOUNTER — Observation Stay (HOSPITAL_COMMUNITY): Payer: Medicare Other

## 2014-05-05 DIAGNOSIS — H409 Unspecified glaucoma: Secondary | ICD-10-CM | POA: Diagnosis present

## 2014-05-05 DIAGNOSIS — Z8249 Family history of ischemic heart disease and other diseases of the circulatory system: Secondary | ICD-10-CM | POA: Diagnosis not present

## 2014-05-05 DIAGNOSIS — N179 Acute kidney failure, unspecified: Secondary | ICD-10-CM | POA: Diagnosis present

## 2014-05-05 DIAGNOSIS — Z800 Family history of malignant neoplasm of digestive organs: Secondary | ICD-10-CM | POA: Diagnosis not present

## 2014-05-05 DIAGNOSIS — Z880 Allergy status to penicillin: Secondary | ICD-10-CM | POA: Diagnosis not present

## 2014-05-05 DIAGNOSIS — Z86718 Personal history of other venous thrombosis and embolism: Secondary | ICD-10-CM | POA: Diagnosis not present

## 2014-05-05 DIAGNOSIS — Z886 Allergy status to analgesic agent status: Secondary | ICD-10-CM | POA: Diagnosis not present

## 2014-05-05 DIAGNOSIS — Z823 Family history of stroke: Secondary | ICD-10-CM | POA: Diagnosis not present

## 2014-05-05 DIAGNOSIS — R1011 Right upper quadrant pain: Secondary | ICD-10-CM | POA: Diagnosis present

## 2014-05-05 DIAGNOSIS — E119 Type 2 diabetes mellitus without complications: Secondary | ICD-10-CM | POA: Diagnosis present

## 2014-05-05 DIAGNOSIS — J449 Chronic obstructive pulmonary disease, unspecified: Secondary | ICD-10-CM | POA: Diagnosis present

## 2014-05-05 DIAGNOSIS — I129 Hypertensive chronic kidney disease with stage 1 through stage 4 chronic kidney disease, or unspecified chronic kidney disease: Secondary | ICD-10-CM | POA: Diagnosis present

## 2014-05-05 DIAGNOSIS — Z79899 Other long term (current) drug therapy: Secondary | ICD-10-CM | POA: Diagnosis not present

## 2014-05-05 DIAGNOSIS — E782 Mixed hyperlipidemia: Secondary | ICD-10-CM | POA: Diagnosis present

## 2014-05-05 DIAGNOSIS — N183 Chronic kidney disease, stage 3 (moderate): Secondary | ICD-10-CM | POA: Diagnosis present

## 2014-05-05 DIAGNOSIS — K219 Gastro-esophageal reflux disease without esophagitis: Secondary | ICD-10-CM | POA: Diagnosis present

## 2014-05-05 DIAGNOSIS — Z87891 Personal history of nicotine dependence: Secondary | ICD-10-CM | POA: Diagnosis not present

## 2014-05-05 DIAGNOSIS — D649 Anemia, unspecified: Secondary | ICD-10-CM | POA: Diagnosis present

## 2014-05-05 DIAGNOSIS — Z885 Allergy status to narcotic agent status: Secondary | ICD-10-CM | POA: Diagnosis not present

## 2014-05-05 DIAGNOSIS — Z9049 Acquired absence of other specified parts of digestive tract: Secondary | ICD-10-CM | POA: Diagnosis present

## 2014-05-05 DIAGNOSIS — E039 Hypothyroidism, unspecified: Secondary | ICD-10-CM | POA: Diagnosis present

## 2014-05-05 DIAGNOSIS — Z23 Encounter for immunization: Secondary | ICD-10-CM | POA: Diagnosis not present

## 2014-05-05 DIAGNOSIS — K2960 Other gastritis without bleeding: Secondary | ICD-10-CM | POA: Diagnosis present

## 2014-05-05 DIAGNOSIS — Z882 Allergy status to sulfonamides status: Secondary | ICD-10-CM | POA: Diagnosis not present

## 2014-05-05 LAB — URINALYSIS, ROUTINE W REFLEX MICROSCOPIC
Bilirubin Urine: NEGATIVE
GLUCOSE, UA: NEGATIVE mg/dL
Hgb urine dipstick: NEGATIVE
KETONES UR: NEGATIVE mg/dL
Nitrite: POSITIVE — AB
PH: 6 (ref 5.0–8.0)
Protein, ur: NEGATIVE mg/dL
Urobilinogen, UA: 0.2 mg/dL (ref 0.0–1.0)

## 2014-05-05 LAB — BASIC METABOLIC PANEL
ANION GAP: 11 (ref 5–15)
BUN: 15 mg/dL (ref 6–23)
CO2: 25 meq/L (ref 19–32)
Calcium: 9.1 mg/dL (ref 8.4–10.5)
Chloride: 106 mEq/L (ref 96–112)
Creatinine, Ser: 1.44 mg/dL — ABNORMAL HIGH (ref 0.50–1.10)
GFR calc Af Amer: 41 mL/min — ABNORMAL LOW (ref >90)
GFR, EST NON AFRICAN AMERICAN: 35 mL/min — AB (ref >90)
Glucose, Bld: 99 mg/dL (ref 70–99)
Potassium: 4.1 mEq/L (ref 3.7–5.3)
SODIUM: 142 meq/L (ref 137–147)

## 2014-05-05 LAB — GLUCOSE, CAPILLARY
GLUCOSE-CAPILLARY: 122 mg/dL — AB (ref 70–99)
Glucose-Capillary: 122 mg/dL — ABNORMAL HIGH (ref 70–99)
Glucose-Capillary: 113 mg/dL — ABNORMAL HIGH (ref 70–99)

## 2014-05-05 LAB — CBC
HCT: 31.8 % — ABNORMAL LOW (ref 36.0–46.0)
HEMOGLOBIN: 10 g/dL — AB (ref 12.0–15.0)
MCH: 27.5 pg (ref 26.0–34.0)
MCHC: 31.4 g/dL (ref 30.0–36.0)
MCV: 87.6 fL (ref 78.0–100.0)
Platelets: 264 10*3/uL (ref 150–400)
RBC: 3.63 MIL/uL — AB (ref 3.87–5.11)
RDW: 14 % (ref 11.5–15.5)
WBC: 5.4 10*3/uL (ref 4.0–10.5)

## 2014-05-05 LAB — URINE MICROSCOPIC-ADD ON

## 2014-05-05 LAB — PROTIME-INR
INR: 2.42 — ABNORMAL HIGH (ref 0.00–1.49)
Prothrombin Time: 26.5 seconds — ABNORMAL HIGH (ref 11.6–15.2)

## 2014-05-05 MED ORDER — SODIUM CHLORIDE 0.9 % IV SOLN
INTRAVENOUS | Status: DC
  Administered 2014-05-05 – 2014-05-07 (×4): via INTRAVENOUS

## 2014-05-05 MED ORDER — MORPHINE SULFATE 2 MG/ML IJ SOLN: 2.0000 mg | INTRAMUSCULAR | Status: DC | PRN

## 2014-05-05 MED ORDER — CIPROFLOXACIN HCL 250 MG PO TABS
250.0000 mg | ORAL_TABLET | Freq: Two times a day (BID) | ORAL | Status: DC
  Administered 2014-05-05 – 2014-05-07 (×4): 250 mg via ORAL
  Filled 2014-05-05 (×4): qty 1

## 2014-05-05 MED ORDER — WARFARIN SODIUM 1 MG PO TABS
0.5000 mg | ORAL_TABLET | Freq: Once | ORAL | Status: AC
  Administered 2014-05-05: 0.5 mg via ORAL
  Filled 2014-05-05: qty 1

## 2014-05-05 NOTE — Progress Notes (Signed)
ANTICOAGULATION CONSULT NOTE -  Pharmacy Consult for Coumadin Indication: hx DVT  Allergies  Allergen Reactions  . Vioxx [Rofecoxib] Shortness Of Breath  . Penicillins     rash  . Codeine Rash  . Motrin [Ibuprofen] Rash  . Sulfur Rash    Patient Measurements: Height: 5\' 2"  (157.5 cm) Weight: 163 lb 2.3 oz (74 kg) IBW/kg (Calculated) : 50.1  Vital Signs: Temp: 97.9 F (36.6 C) (11/01 0601) Temp Source: Oral (11/01 0601) BP: 101/50 mmHg (11/01 0601) Pulse Rate: 59 (11/01 0601)  Labs:  Recent Labs  05/03/14 7893 05/03/14 1712 05/03/14 2218 05/04/14 0453 05/05/14 0548  HGB 11.2*  --   --  10.3* 10.0*  HCT 35.4*  --   --  32.1* 31.8*  PLT 297  --   --  245 264  LABPROT 20.2*  --   --  23.5* 26.5*  INR 1.71*  --   --  2.08* 2.42*  CREATININE 1.16*  --   --  1.26* 1.44*  TROPONINI <0.30 <0.30 <0.30 <0.30  --     Estimated Creatinine Clearance: 32.8 mL/min (by C-G formula based on Cr of 1.44).   Medical History: Past Medical History  Diagnosis Date  . Hypertension   . Diabetes mellitus     x 5 yrs  . Hypothyroidism   . DVT of axillary vein, acute left 07/24/12  . Back pain   . Vertigo     chonic  . Mixed hyperlipidemia   . GERD (gastroesophageal reflux disease)   . Glaucoma   . Sigmoid diverticulitis   . Asthmatic bronchitis   . Pinched nerve     right elbow  . Chronic kidney disease     kidney function low    Medications:  Prescriptions prior to admission  Medication Sig Dispense Refill Last Dose  . amitriptyline (ELAVIL) 25 MG tablet Take 25 mg by mouth at bedtime.   05/02/2014 at Unknown time  . cetirizine (ZYRTEC) 10 MG tablet Take 10 mg by mouth daily.   05/02/2014 at Unknown time  . esomeprazole (NEXIUM) 20 MG capsule Take 20 mg by mouth daily at 12 noon.   05/02/2014 at Unknown time  . furosemide (LASIX) 20 MG tablet Take 20 mg by mouth daily as needed (FOR FLUID RETENTION).   05/02/2014 at Unknown time  . latanoprost (XALATAN) 0.005 %  ophthalmic solution 1 drop at bedtime.   05/02/2014 at Unknown time  . losartan (COZAAR) 50 MG tablet Take 25 mg by mouth daily.   Past Week at Unknown time  . meclizine (ANTIVERT) 25 MG tablet Take 25 mg by mouth as needed for dizziness.    05/01/2014 at Unknown time  . Omega-3 Fatty Acids (FISH OIL) 1200 MG CAPS Take 1 capsule by mouth 2 (two) times daily.   Past Month at Unknown time  . polyethylene glycol (MIRALAX / GLYCOLAX) packet Take 17 g by mouth daily.   05/02/2014 at Unknown time  . potassium chloride (K-DUR) 10 MEQ tablet Take 10 mEq by mouth daily. *TO BE TAKEN WITH LASIX (FUROSEMIDE)   05/02/2014 at Unknown time  . sitaGLIPtin (JANUVIA) 50 MG tablet Take 50 mg by mouth as needed (high blood sugar).    05/02/2014 at Unknown time  . topiramate (TOPAMAX) 25 MG tablet Take 50 mg by mouth daily.    05/02/2014 at Unknown time  . warfarin (COUMADIN) 3 MG tablet Take 1.5-3 mg by mouth daily. Alternating 1.5 mg and 3 mg every other day.  05/02/2014 at Unknown time    Assessment: 65 yoF admitted with chest pain on chronic warfarin for hx DVT.  INR slightly below goal on admission.  Home dose noted above.  No bleeding noted. INR therapeutic today, however large increase over last 2 days. Lower dose today to slow increase and maintain therapeutic INR  Goal of Therapy:  INR 2-3   Plan:  Coumadin 0.5 mg po today Daily INR  Abner Greenspan, Love Chowning Bennett 05/05/2014,8:36 AM

## 2014-05-05 NOTE — Plan of Care (Signed)
Problem: Consults Goal: Diabetes Guidelines if Diabetic/Glucose > 140 If diabetic or lab glucose is > 140 mg/dl - Initiate Diabetes/Hyperglycemia Guidelines & Document Interventions  Outcome: Completed/Met Date Met:  05/05/14  Problem: Phase I Progression Outcomes Goal: Other Phase I Outcomes/Goals Outcome: Completed/Met Date Met:  05/05/14  Problem: Phase II Progression Outcomes Goal: Anginal pain relieved Outcome: Completed/Met Date Met:  05/05/14 Goal: Stress Test if indicated Outcome: Not Applicable Date Met:  97/35/32 Goal: Cath/PCI Day Path if indicated Outcome: Not Applicable Date Met:  99/24/26 Goal: CV Risk Factors identified Outcome: Completed/Met Date Met:  05/05/14 Goal: Cardiac Rehab if ordered Outcome: Not Applicable Date Met:  83/41/96 Goal: If positive for MI, change to MI Path Outcome: Not Applicable Date Met:  22/29/79 Goal: Other Phase II Outcomes/Goals Outcome: Completed/Met Date Met:  05/05/14  Problem: Phase III Progression Outcomes Goal: Hemodynamically stable Outcome: Completed/Met Date Met:  05/05/14 Goal: No anginal pain Outcome: Completed/Met Date Met:  05/05/14 Goal: Cath/PCI Path as indicated Outcome: Not Applicable Date Met:  89/21/19 Goal: Vascular site scale level 0 - I Vascular Site Scale Level 0: No bruising/bleeding/hematoma Level I (Mild): Bruising/Ecchymosis, minimal bleeding/ooozing, palpable hematoma < 3 cm Level II (Moderate): Bleeding not affecting hemodynamic parameters, pseudoaneurysm, palpable hematoma > 3 cm Level III (Severe) Bleeding which affects hemodynamic parameters or retroperitoneal hemorrhage  Outcome: Not Applicable Date Met:  41/74/08 Goal: Tolerating diet Outcome: Completed/Met Date Met:  05/05/14 Goal: If positive for MI, change to MI Path Outcome: Not Applicable Date Met:  14/48/18

## 2014-05-05 NOTE — Progress Notes (Signed)
UR completed 

## 2014-05-05 NOTE — Plan of Care (Signed)
Problem: Phase II Progression Outcomes Goal: Hemodynamically stable Outcome: Completed/Met Date Met:  05/05/14     

## 2014-05-05 NOTE — Progress Notes (Signed)
PROGRESS NOTE  KECHIA YAHNKE ELF:[id number] DOB: 1941/01/08 DOA: 05/03/2014 PCP: Purvis Kilts, MD  Summary: 73yow presented to ED with >5 day h/o right-sided chest pain, RUQ pain with radiation to back. Initial evaluation unremarkable, referred for CP evaluation.  Assessment/Plan: 1. RUQ improved, etiology remains unclear, aggravated by movement and deep breath. Recurrent, controlled with oral medication but persistent. Point tenderness makes acute intra-abdominal process less likely.additionally, this area was imaged on chest CT and visualized solid organs were unremarkable. Chest angiogram negative for PE.Abdominal x-ray, right upper quadrant ultrasound unremarkable. Normal bowel movement and x-ray argue against any obstructive component. Has ruled out for ACS. LFTs normal, lipase normal. No suspicion for renal colic.  2. Reported CP on admission. Resolved, no recurrence. On my evaluation was RUQ not chest pain. Nevertheless ruled out for ACS. RBBB of unknown chronicity. I spoke to staff 10/30 at (formerly) [facility] and requested last EKG, office note which was supposed to have been faxed, but was not received. Based on atypical symptoms and workup this far would not suggest any further workup. 3. DM type 2,remained stable. 4. CKD stage III, with possible acute renal failure, possibly contrast-induced. 5. Chronic normocytic anemia, remains stable.  6. H/o DVT. Chest CT on admission negative for PE. Continue warfarin.   Etiology of pain remains obscure. Plan abdominal/pelvic CT with oral contrast further evaluate. Will consider GI consultation of unremarkable.  IV fluids. Check basic metabolic panel in the morning. CBC in the morning.  Code Status: full code DVT prophylaxis: warfarin Family Communication: none present Disposition Plan:   Murray Hodgkins, MD  [facility]  Pager [phone] If 7PM-7AM, please contact night-coverage at [email or web address], password [facility] 05/05/2014, 10:29  AM  LOS: 2 days   Consultants:    Procedures:    Antibiotics:    HPI/Subjective: No issues overnight. Pain well-controlled with oral medication. However continues to have significant pain with ambulation or deep breath.pain same location right upper quadrant and epigastrium just below the costal margin. Tolerating diet but she reports an episode of emesis. Normal bowel movement this morning.   Objective: Filed Vitals:   05/04/14 0529 05/04/14 1338 05/04/14 2116 05/05/14 0601  BP: 115/48 95/39 100/47 101/50  Pulse: 54 63 66 59  Temp: 97.7 F (36.5 C) 97.8 F (36.6 C) 98.2 F (36.8 C) 97.9 F (36.6 C)  TempSrc: Oral Oral Oral Oral  Resp: 16 18 20 16   Height:      Weight:      SpO2: 97% 96% 96% 94%    Intake/Output Summary (Last 24 hours) at 05/05/14 1029 Last data filed at 05/05/14 0906  Gross per 24 hour  Intake    960 ml  Output   2655 ml  Net  -1695 ml     Filed Weights   05/03/14 0847 05/03/14 1328  Weight: 72.576 kg (160 lb) 74 kg (163 lb 2.3 oz)    Exam:     Afebrile, vital signs stable. No hypoxia.  Appears calm, multiple. Nontoxic.  Speech fluent, clear. Alert.  Cardiovascular regular rate and rhythm. No murmur, rub or gallop. No lower extremity edema. Telemetry SR.  Respiratory clear to auscultation bilaterally. No wheezes, rales or rhonchi. Normal respiratory effort.  Abdomen. Skin appears unremarkable over the abdomen. The right upper quadrant and epigastrium appear unremarkable. There is significant tenderness palpation over the right upper quadrant and epigastrium just below costal margin. Positive bowel sounds. Remainder of abdomen nontender.  Data Reviewed:  Creatinine has increased to 1.44. Remainder of  basic metabolic panel unremarkable.  Hemoglobin stable 10.0.  INR 2.42.  Scheduled Meds: . amitriptyline  25 mg Oral QHS  . aspirin EC  81 mg Oral Daily  . insulin aspart  0-9 Units Subcutaneous TID WC  . latanoprost  1 drop  Both Eyes QHS  . loratadine  10 mg Oral Daily  . losartan  25 mg Oral Daily  . pantoprazole  40 mg Oral Daily  . polyethylene glycol  17 g Oral Daily  . sodium chloride  3 mL Intravenous Q12H  . sodium chloride  3 mL Intravenous Q12H  . topiramate  50 mg Oral Daily  . warfarin  0.5 mg Oral Once  . Warfarin - Pharmacist Dosing Inpatient   Does not apply Q24H   Continuous Infusions:   Principal Problem:   RUQ pain Active Problems:   DM (diabetes mellitus)   Chest pain   Time spent 25 minutes

## 2014-05-06 DIAGNOSIS — R1011 Right upper quadrant pain: Secondary | ICD-10-CM

## 2014-05-06 DIAGNOSIS — E119 Type 2 diabetes mellitus without complications: Secondary | ICD-10-CM | POA: Insufficient documentation

## 2014-05-06 DIAGNOSIS — N19 Unspecified kidney failure: Secondary | ICD-10-CM | POA: Diagnosis present

## 2014-05-06 DIAGNOSIS — R079 Chest pain, unspecified: Secondary | ICD-10-CM

## 2014-05-06 DIAGNOSIS — D649 Anemia, unspecified: Secondary | ICD-10-CM

## 2014-05-06 DIAGNOSIS — N179 Acute kidney failure, unspecified: Secondary | ICD-10-CM | POA: Diagnosis present

## 2014-05-06 LAB — COMPREHENSIVE METABOLIC PANEL
ALBUMIN: 2.9 g/dL — AB (ref 3.5–5.2)
ALK PHOS: 86 U/L (ref 39–117)
ALT: 32 U/L (ref 0–35)
AST: 33 U/L (ref 0–37)
Anion gap: 10 (ref 5–15)
BILIRUBIN TOTAL: 0.2 mg/dL — AB (ref 0.3–1.2)
BUN: 14 mg/dL (ref 6–23)
CHLORIDE: 111 meq/L (ref 96–112)
CO2: 23 mEq/L (ref 19–32)
Calcium: 8.9 mg/dL (ref 8.4–10.5)
Creatinine, Ser: 1.36 mg/dL — ABNORMAL HIGH (ref 0.50–1.10)
GFR calc Af Amer: 44 mL/min — ABNORMAL LOW (ref >90)
GFR calc non Af Amer: 38 mL/min — ABNORMAL LOW (ref >90)
Glucose, Bld: 96 mg/dL (ref 70–99)
POTASSIUM: 4.2 meq/L (ref 3.7–5.3)
SODIUM: 144 meq/L (ref 137–147)
TOTAL PROTEIN: 6 g/dL (ref 6.0–8.3)

## 2014-05-06 LAB — GLUCOSE, CAPILLARY
GLUCOSE-CAPILLARY: 98 mg/dL (ref 70–99)
GLUCOSE-CAPILLARY: 94 mg/dL (ref 70–99)
GLUCOSE-CAPILLARY: 98 mg/dL (ref 70–99)
Glucose-Capillary: 107 mg/dL — ABNORMAL HIGH (ref 70–99)
Glucose-Capillary: 114 mg/dL — ABNORMAL HIGH (ref 70–99)

## 2014-05-06 LAB — CBC
HEMATOCRIT: 30.7 % — AB (ref 36.0–46.0)
Hemoglobin: 9.8 g/dL — ABNORMAL LOW (ref 12.0–15.0)
MCH: 27.8 pg (ref 26.0–34.0)
MCHC: 31.9 g/dL (ref 30.0–36.0)
MCV: 87 fL (ref 78.0–100.0)
PLATELETS: 263 10*3/uL (ref 150–400)
RBC: 3.53 MIL/uL — ABNORMAL LOW (ref 3.87–5.11)
RDW: 13.9 % (ref 11.5–15.5)
WBC: 4.4 10*3/uL (ref 4.0–10.5)

## 2014-05-06 LAB — PROTIME-INR
INR: 2.24 — ABNORMAL HIGH (ref 0.00–1.49)
Prothrombin Time: 25 seconds — ABNORMAL HIGH (ref 11.6–15.2)

## 2014-05-06 MED ORDER — LIDOCAINE 5 % EX PTCH
1.0000 | MEDICATED_PATCH | Freq: Every day | CUTANEOUS | Status: DC
  Administered 2014-05-06 – 2014-05-07 (×2): 1 via TRANSDERMAL
  Filled 2014-05-06 (×3): qty 1

## 2014-05-06 MED ORDER — WARFARIN SODIUM 1 MG PO TABS
1.5000 mg | ORAL_TABLET | Freq: Once | ORAL | Status: AC
  Administered 2014-05-06: 1.5 mg via ORAL
  Filled 2014-05-06: qty 2

## 2014-05-06 NOTE — Consult Note (Signed)
Reason for Consult:right upper quadrant abdominal pain Referring Physician: hospitalists  [name] is an 73 y.o. female.  HPI: patient is a 73 year old white female who presents with a weeklong history of intermittent right upper quadrant abdominal pain. She states that she is tender to touch right upper right costal margin. She sometimes has to hold her breast up due to the pain in that area. It seems to radiate up into the right chest. She denies any nausea or vomiting. She is status post laparoscopic cholecystectomy by myself in 2008. The pain is made worse with movement.  Past Medical History  Diagnosis Date  . Hypertension   . Diabetes mellitus     x 5 yrs  . Hypothyroidism   . DVT of axillary vein, acute left 07/24/12  . Back pain   . Vertigo     chonic  . Mixed hyperlipidemia   . GERD (gastroesophageal reflux disease)   . Glaucoma   . Sigmoid diverticulitis   . Asthmatic bronchitis   . Pinched nerve     right elbow  . Chronic kidney disease     kidney function low    Past Surgical History  Procedure Laterality Date  . Other surgical history      colostomy, colostomy reversal, for diverticulitis surgical hernia repair, arm surgery, neck surgery  . Abdominal hysterectomy    . Fracture left foot    . Eye surgery    . Back surgery      spinal   . Cholecystectomy    . Colon surgery    . Hernia repair    . Colostomy closure    . Colonoscopy N/A 12/27/2013    Procedure: COLONOSCOPY;  Surgeon: Rogene Houston, MD;  Location: [facility];  Service: Endoscopy;  Laterality: N/A;  200    Family History  Problem Relation Age of Onset  . Other Mother 65    Cause unknown  . Cancer Mother     liver  . CVA Maternal Grandmother 90    deceased  . Heart disease Maternal Grandmother   . Stroke Maternal Grandmother   . Heart attack Brother 25    deceased  . Cancer Sister 63    deceased    Social History:  reports that she quit smoking about 4 months ago. Her  smoking use included Cigarettes. She smoked 0.00 packs per day. She has never used smokeless tobacco. She reports that she does not drink alcohol or use illicit drugs.  Allergies:  Allergies  Allergen Reactions  . Vioxx [Rofecoxib] Shortness Of Breath  . Penicillins     rash  . Codeine Rash  . Motrin [Ibuprofen] Rash  . Sulfur Rash    Medications: I have reviewed the patient's current medications.  Results for orders placed or performed during the hospital encounter of 05/03/14 (from the past 48 hour(s))  Glucose, capillary     Status: Abnormal   Collection Time: 05/04/14 11:18 AM  Result Value Ref Range   Glucose-Capillary 139 (H) 70 - 99 mg/dL  Glucose, capillary     Status: None   Collection Time: 05/04/14  4:15 PM  Result Value Ref Range   Glucose-Capillary 89 70 - 99 mg/dL  Glucose, capillary     Status: Abnormal   Collection Time: 05/04/14  9:12 PM  Result Value Ref Range   Glucose-Capillary 120 (H) 70 - 99 mg/dL   Comment 1 Notify RN    Comment 2 Documented in Chart   Protime-INR  Status: Abnormal   Collection Time: 05/05/14  5:48 AM  Result Value Ref Range   Prothrombin Time 26.5 (H) 11.6 - 15.2 seconds   INR 2.42 (H) 0.00 - 1.49  CBC     Status: Abnormal   Collection Time: 05/05/14  5:48 AM  Result Value Ref Range   WBC 5.4 4.0 - 10.5 K/uL   RBC 3.63 (L) 3.87 - 5.11 MIL/uL   Hemoglobin 10.0 (L) 12.0 - 15.0 g/dL   HCT 31.8 (L) 36.0 - 46.0 %   MCV 87.6 78.0 - 100.0 fL   MCH 27.5 26.0 - 34.0 pg   MCHC 31.4 30.0 - 36.0 g/dL   RDW 14.0 11.5 - 15.5 %   Platelets 264 150 - 400 K/uL  Basic metabolic panel     Status: Abnormal   Collection Time: 05/05/14  5:48 AM  Result Value Ref Range   Sodium 142 137 - 147 mEq/L   Potassium 4.1 3.7 - 5.3 mEq/L   Chloride 106 96 - 112 mEq/L   CO2 25 19 - 32 mEq/L   Glucose, Bld 99 70 - 99 mg/dL   BUN 15 6 - 23 mg/dL   Creatinine, Ser 1.44 (H) 0.50 - 1.10 mg/dL   Calcium 9.1 8.4 - 10.5 mg/dL   GFR calc non Af Amer 35 (L)  >90 mL/min   GFR calc Af Amer 41 (L) >90 mL/min    Comment: (NOTE) The eGFR has been calculated using the CKD EPI equation. This calculation has not been validated in all clinical situations. eGFR's persistently <90 mL/min signify possible Chronic Kidney Disease.    Anion gap 11 5 - 15  Glucose, capillary     Status: None   Collection Time: 05/05/14  8:28 AM  Result Value Ref Range   Glucose-Capillary 98 70 - 99 mg/dL  Urinalysis, Routine w reflex microscopic     Status: Abnormal   Collection Time: 05/05/14  9:00 AM  Result Value Ref Range   Color, Urine YELLOW YELLOW   APPearance HAZY (A) CLEAR   Specific Gravity, Urine <1.005 (L) 1.005 - 1.030   pH 6.0 5.0 - 8.0   Glucose, UA NEGATIVE NEGATIVE mg/dL   Hgb urine dipstick NEGATIVE NEGATIVE   Bilirubin Urine NEGATIVE NEGATIVE   Ketones, ur NEGATIVE NEGATIVE mg/dL   Protein, ur NEGATIVE NEGATIVE mg/dL   Urobilinogen, UA 0.2 0.0 - 1.0 mg/dL   Nitrite POSITIVE (A) NEGATIVE   Leukocytes, UA MODERATE (A) NEGATIVE  Urine microscopic-add on     Status: Abnormal   Collection Time: 05/05/14  9:00 AM  Result Value Ref Range   Squamous Epithelial / LPF FEW (A) RARE   WBC, UA TOO NUMEROUS TO COUNT <3 WBC/hpf   Bacteria, UA MANY (A) RARE  Glucose, capillary     Status: Abnormal   Collection Time: 05/05/14 10:31 AM  Result Value Ref Range   Glucose-Capillary 122 (H) 70 - 99 mg/dL  Glucose, capillary     Status: Abnormal   Collection Time: 05/05/14  4:11 PM  Result Value Ref Range   Glucose-Capillary 122 (H) 70 - 99 mg/dL  Glucose, capillary     Status: Abnormal   Collection Time: 05/05/14  8:33 PM  Result Value Ref Range   Glucose-Capillary 113 (H) 70 - 99 mg/dL   Comment 1 Notify RN   Protime-INR     Status: Abnormal   Collection Time: 05/06/14  6:18 AM  Result Value Ref Range   Prothrombin Time 25.0 (H) 11.6 -  15.2 seconds   INR 2.24 (H) 0.00 - 1.49  CBC     Status: Abnormal   Collection Time: 05/06/14  6:18 AM  Result Value  Ref Range   WBC 4.4 4.0 - 10.5 K/uL   RBC 3.53 (L) 3.87 - 5.11 MIL/uL   Hemoglobin 9.8 (L) 12.0 - 15.0 g/dL   HCT 30.7 (L) 36.0 - 46.0 %   MCV 87.0 78.0 - 100.0 fL   MCH 27.8 26.0 - 34.0 pg   MCHC 31.9 30.0 - 36.0 g/dL   RDW 13.9 11.5 - 15.5 %   Platelets 263 150 - 400 K/uL  Comprehensive metabolic panel     Status: Abnormal   Collection Time: 05/06/14  6:18 AM  Result Value Ref Range   Sodium 144 137 - 147 mEq/L   Potassium 4.2 3.7 - 5.3 mEq/L   Chloride 111 96 - 112 mEq/L   CO2 23 19 - 32 mEq/L   Glucose, Bld 96 70 - 99 mg/dL   BUN 14 6 - 23 mg/dL   Creatinine, Ser 1.36 (H) 0.50 - 1.10 mg/dL   Calcium 8.9 8.4 - 10.5 mg/dL   Total Protein 6.0 6.0 - 8.3 g/dL   Albumin 2.9 (L) 3.5 - 5.2 g/dL   AST 33 0 - 37 U/L   ALT 32 0 - 35 U/L   Alkaline Phosphatase 86 39 - 117 U/L   Total Bilirubin 0.2 (L) 0.3 - 1.2 mg/dL   GFR calc non Af Amer 38 (L) >90 mL/min   GFR calc Af Amer 44 (L) >90 mL/min    Comment: (NOTE) The eGFR has been calculated using the CKD EPI equation. This calculation has not been validated in all clinical situations. eGFR's persistently <90 mL/min signify possible Chronic Kidney Disease.    Anion gap 10 5 - 15  Glucose, capillary     Status: None   Collection Time: 05/06/14  7:36 AM  Result Value Ref Range   Glucose-Capillary 94 70 - 99 mg/dL   Comment 1 Notify RN     Ct Abdomen Pelvis Wo Contrast  05/05/2014   CLINICAL DATA:  Right upper quadrant pain, history of chronic renal disease  EXAM: CT ABDOMEN AND PELVIS WITHOUT CONTRAST  TECHNIQUE: Multidetector CT imaging of the abdomen and pelvis was performed following the standard protocol without IV contrast.  COMPARISON:  08/19/2012  FINDINGS: The lung bases are free of acute infiltrate or sizable effusion. Visualized portions of the cardiac structures are within normal limits. The gallbladder has been surgically removed. The liver, spleen, adrenal glands and pancreas are within normal limits. The kidneys are  well visualized bilaterally and demonstrate multiple punctate nonobstructing renal stones. The largest of these lie on the right but still measure 1-2 mm. No ureteral calculi or obstructive changes are noted. The bladder is partially distended. The appendix is not well visualized although no right lower quadrant inflammatory changes are seen. The uterus has been surgically removed. The osseous structures are within normal limits.  IMPRESSION: Multiple nonobstructing renal calculi  No acute abnormality noted.   Electronically Signed   By: Inez Catalina M.D.   On: 05/05/2014 15:18    ROS: see chart  Blood pressure 100/39, pulse 57, temperature 98.3 F (36.8 C), temperature source Oral, resp. rate 18, height $RemoveBe'5\' 2"'[id number]$  (1.575 m), weight 74 kg (163 lb 2.3 oz), SpO2 94 %. Physical Exam: pleasant white female who was eating breakfast, no acute distress. Her abdomen is soft with what appeared to be  point tenderness right at the right costal margin. When I would reexamine this area, the point tenderness seemed to change to some degree. No rigidity is noted. No hernias were appreciated.  Assessment/Plan: Impression: Abdominal pain of unknown etiology. Both ultrasound and CT scan of the abdomen were negative. It was difficult for me to determine whether this was from the right costal margin or the abdominal wall. That all being said, I could not find a surgical source of her point tenderness. Agree with GI consultation.  Will follow peripherally with you.  Cherylann Hobday A 05/06/2014, 11:00 AM

## 2014-05-06 NOTE — Progress Notes (Signed)
[facility] PROGRESS NOTE  [name] IZT:[id number] DOB: 1941/01/05 DOA: 05/03/2014 PCP: Purvis Kilts, MD  Assessment/Plan: 1. RUQ  etiology remains unclear. No improvement this am. Movement and deep breathing make worse. Requiring pain med every 4 hours or so. Chest CT on 05/03/14 visualized solid organs were unremarkable. Chest angiogram negative for PE.Abdominal x-ray, right upper quadrant ultrasound unremarkable. Normal bowel movement and x-ray argue against any obstructive component. Has ruled out for ACS. LFTs normal, lipase normal. No suspicion for renal colic. Evaluated by Dr Arnoldo Morale with general surgery who opined no surgical source of point tenderness. Will request GI consult.  2. Reported CP on admission. Resolved, no recurrence. Ruled out for ACS. RBBB of unknown chronicity. Still waiting on old records from [place]. No indication at this point for any further cardiac workup 3. DM type 2, CBG range 94-107. Will obtain A1c.  monitor 4. CKD stage III, with possible acute renal failure, possibly contrast-induced. Creatinine trending down slightly today. Baseline appears to be 1.1-1.2. Currently 1.36. Urine output good. Will monitor. 5. Chronic normocytic anemia. Hg only sightly below baseline. May be dilutional. No s/sx bleeding. monitor.  1. H/o DVT. Chest CT on admission negative for PE. Continue warfarin. inr 2.24  Code Status: full Family Communication: none present Disposition Plan: home when ready   Consultants:  Dr Laural Golden GI  Dr Arnoldo Morale general surgery  Procedures:  none  Antibiotics:  none  HPI/Subjective: Awake alert. Continues with right quadrant pain. Denies nausea/vom  Objective: Filed Vitals:   05/06/14 0625  BP: 100/39  Pulse: 57  Temp: 98.3 F (36.8 C)  Resp: 18    Intake/Output Summary (Last 24 hours) at 05/06/14 1357 Last data filed at 05/06/14 3825  Gross per 24 hour  Intake 2418.75 ml  Output   3000 ml  Net -581.25  ml   Filed Weights   05/03/14 0847 05/03/14 1328  Weight: 72.576 kg (160 lb) 74 kg (163 lb 2.3 oz)    Exam:   General:  Well nourished   Cardiovascular: RRR no m/g/r no LE edema  Respiratory: normal effort BS clear bilaterally no wheeze  Abdomen: obese abdominal binder intact. Soft +BS moderate tenderness to RUQ otherwise non-tender.   Musculoskeletal: no clubbing or cyanosis  Data Reviewed: Basic Metabolic Panel:  Recent Labs Lab 05/03/14 0852 05/04/14 0453 05/05/14 0548 05/06/14 0618  NA 143 146 142 144  K 3.9 4.2 4.1 4.2  CL 108 111 106 111  CO2 25 25 25 23   GLUCOSE 101* 94 99 96  BUN 14 12 15 14   CREATININE 1.16* 1.26* 1.44* 1.36*  CALCIUM 9.5 9.2 9.1 8.9   Liver Function Tests:  Recent Labs Lab 05/03/14 0852 05/04/14 0453 05/06/14 0618  AST 19 28 33  ALT 18 23 32  ALKPHOS 96 85 86  BILITOT 0.2* 0.3 0.2*  PROT 7.2 6.3 6.0  ALBUMIN 3.6 3.1* 2.9*    Recent Labs Lab 05/03/14 0852  LIPASE 54   No results for input(s): AMMONIA in the last 168 hours. CBC:  Recent Labs Lab 05/03/14 0852 05/04/14 0453 05/05/14 0548 05/06/14 0618  WBC 6.1 5.1 5.4 4.4  NEUTROABS 3.6  --   --   --   HGB 11.2* 10.3* 10.0* 9.8*  HCT 35.4* 32.1* 31.8* 30.7*  MCV 87.0 87.0 87.6 87.0  PLT 297 245 264 263   Cardiac Enzymes:  Recent Labs Lab 05/03/14 0852 05/03/14 1712 05/03/14 2218 05/04/14 0453  TROPONINI <0.30 <0.30 <0.30 <0.30  BNP (last 3 results) No results for input(s): PROBNP in the last 8760 hours. CBG:  Recent Labs Lab 05/05/14 1031 05/05/14 1611 05/05/14 2033 05/06/14 0736 05/06/14 1203  GLUCAP 122* 122* 113* 94 107*    No results found for this or any previous visit (from the past 240 hour(s)).   Studies: Ct Abdomen Pelvis Wo Contrast  05/05/2014   CLINICAL DATA:  Right upper quadrant pain, history of chronic renal disease  EXAM: CT ABDOMEN AND PELVIS WITHOUT CONTRAST  TECHNIQUE: Multidetector CT imaging of the abdomen and pelvis was  performed following the standard protocol without IV contrast.  COMPARISON:  08/19/2012  FINDINGS: The lung bases are free of acute infiltrate or sizable effusion. Visualized portions of the cardiac structures are within normal limits. The gallbladder has been surgically removed. The liver, spleen, adrenal glands and pancreas are within normal limits. The kidneys are well visualized bilaterally and demonstrate multiple punctate nonobstructing renal stones. The largest of these lie on the right but still measure 1-2 mm. No ureteral calculi or obstructive changes are noted. The bladder is partially distended. The appendix is not well visualized although no right lower quadrant inflammatory changes are seen. The uterus has been surgically removed. The osseous structures are within normal limits.  IMPRESSION: Multiple nonobstructing renal calculi  No acute abnormality noted.   Electronically Signed   By: Inez Catalina M.D.   On: 05/05/2014 15:18    Scheduled Meds: . amitriptyline  25 mg Oral QHS  . aspirin EC  81 mg Oral Daily  . ciprofloxacin  250 mg Oral BID  . insulin aspart  0-9 Units Subcutaneous TID WC  . latanoprost  1 drop Both Eyes QHS  . loratadine  10 mg Oral Daily  . losartan  25 mg Oral Daily  . pantoprazole  40 mg Oral Daily  . polyethylene glycol  17 g Oral Daily  . sodium chloride  3 mL Intravenous Q12H  . sodium chloride  3 mL Intravenous Q12H  . topiramate  50 mg Oral Daily  . warfarin  1.5 mg Oral Once  . Warfarin - Pharmacist Dosing Inpatient   Does not apply Q24H   Continuous Infusions: . sodium chloride 75 mL/hr at 05/06/14 0011    Principal Problem:   RUQ pain Active Problems:   DM (diabetes mellitus)   Chest pain   Acute renal failure   Anemia    Time spent: 35 minutes    [facility] Pager [phone]. If 7PM-7AM, please contact night-coverage at [email or web address], password [facility] 05/06/2014, 1:57 PM  LOS: 3 days

## 2014-05-06 NOTE — Consult Note (Signed)
Referring Provider: Reinaldo Raddle Primary Care Physician:  Purvis Kilts, MD Primary Gastroenterologist:  Dr. Laural Golden  Reason for Consultation:    Persistent right upper quadrant abdominal pain.  HPI:   Patient is 73 year old Caucasian female who was admitted to hospitalist service on 05/03/2014 for 5 day history of right upper quadrant abdominal pain. Patient states pain started more or less suddenly when she was sitting at church service on 04/28/2014. Pain rapidly increase in severity and was associated with nausea. She thought it was a gas pain and tried Tums and simethicone without any benefit. She also tried baking soda but without any benefit. She did not experience vomiting fever chills chest pain or shortness of breath. Patient did radiate laterally and posteriorly. He also did not experience dysuria or hematuria. Patient's husband was scheduled to have surgery for aortic aneurysm on the morning of 04/29/2014 and she therefore decided to tough it out. Her pain continued and she finally went to see Dr. Hilma Favors on 05/03/2014 and was immediately sent to emergency room for evaluation. Evaluation in emergency room was negative. She had normal LFTs and serum lipase.H&H was mildly decreased at 11.2 and 35.4.troponin level was normal. Chest film and CT anterior chest were negative. Patient was hospitalized for pain control and further management.further workup included upper abdominal ultrasound which revealed evidence of prior cholecystectomy, normal-sized bile duct and no abnormality to liver or pancreas. Yesterday she had abdominal pelvic CT and no abnormality noted to account for her pain. Patient states she is getting good relief with pain medication when it wears off pain in his back. It is a constant pain with waxing and waning pattern. She has noted worsening pain when she coughs or changes posterior in bed or stoops forward. When she grabs this area with her hands and wrists it  helps. She denies cough shortness of breath fever or chills. Urinalysis was abnormal on admission indicating urinary tract infection and she is on Cipro. She denies dysuria or hematuria and she is not feeling any better while on Cipro.Patient states that she has been doing a lot of work at home since her husband was unable to do because of abdominal aortic aneurysm. There is no history of fall or blunt injury. Similarly there is no history of rash. She has good appetite. She states she has gained 15 pounds and see her. She says heartburn is well controlled with therapy. There is no history of peptic ulcer disease. She does not take OTC NSAIDs other than low-dose aspirin.    Past Medical History  Diagnosis Date  . Hypertension   . Diabetes mellitus     x 5 yrs  . Hypothyroidism   . DVT of axillary vein, acute left 07/24/12  . Back pain   . Vertigo     chonic  . Mixed hyperlipidemia   . GERD (gastroesophageal reflux disease)   . Glaucoma   . Sigmoid diverticulitis   . Asthmatic bronchitis   . Pinched nerve     right elbow  . Chronic kidney disease     kidney function low    Past Surgical History  Procedure Laterality Date  . Other surgical history      colostomy, colostomy reversal, for diverticulitis surgical hernia repair, arm surgery, neck surgery  . Abdominal hysterectomy    . Fracture left foot    . Eye surgery    . Back surgery      spinal   . Cholecystectomy    . Colon surgery    .  Hernia repair    . Colostomy closure    . Colonoscopy N/A 12/27/2013    Procedure: COLONOSCOPY;  Surgeon: Rogene Houston, MD;  Location: [facility];  Service: Endoscopy;  Laterality: N/A;  200    Prior to Admission medications   Medication Sig Start Date End Date Taking? Authorizing Provider  amitriptyline (ELAVIL) 25 MG tablet Take 25 mg by mouth at bedtime.   Yes Historical Provider, MD  cetirizine (ZYRTEC) 10 MG tablet Take 10 mg by mouth daily.   Yes Historical Provider, MD   esomeprazole (NEXIUM) 20 MG capsule Take 20 mg by mouth daily at 12 noon.   Yes Historical Provider, MD  furosemide (LASIX) 20 MG tablet Take 20 mg by mouth daily as needed (FOR FLUID RETENTION).   Yes Historical Provider, MD  latanoprost (XALATAN) 0.005 % ophthalmic solution 1 drop at bedtime.   Yes Historical Provider, MD  losartan (COZAAR) 50 MG tablet Take 25 mg by mouth daily.   Yes Historical Provider, MD  meclizine (ANTIVERT) 25 MG tablet Take 25 mg by mouth as needed for dizziness.    Yes Historical Provider, MD  Omega-3 Fatty Acids (FISH OIL) 1200 MG CAPS Take 1 capsule by mouth 2 (two) times daily.   Yes Historical Provider, MD  polyethylene glycol (MIRALAX / GLYCOLAX) packet Take 17 g by mouth daily.   Yes Historical Provider, MD  potassium chloride (K-DUR) 10 MEQ tablet Take 10 mEq by mouth daily. *TO BE TAKEN WITH LASIX (FUROSEMIDE)   Yes Historical Provider, MD  sitaGLIPtin (JANUVIA) 50 MG tablet Take 50 mg by mouth as needed (high blood sugar).    Yes Historical Provider, MD  topiramate (TOPAMAX) 25 MG tablet Take 50 mg by mouth daily.    Yes Historical Provider, MD  warfarin (COUMADIN) 3 MG tablet Take 1.5-3 mg by mouth daily. Alternating 1.5 mg and 3 mg every other day.   Yes Historical Provider, MD    Current Facility-Administered Medications  Medication Dose Route Frequency Provider Last Rate Last Dose  . 0.9 %  sodium chloride infusion  250 mL Intravenous PRN Samuella Cota, MD      . 0.9 %  sodium chloride infusion   Intravenous Continuous Radene Gunning, NP 75 mL/hr at 05/06/14 0011    . acetaminophen (TYLENOL) tablet 650 mg  650 mg Oral Q6H PRN Samuella Cota, MD       Or  . acetaminophen (TYLENOL) suppository 650 mg  650 mg Rectal Q6H PRN Samuella Cota, MD      . amitriptyline (ELAVIL) tablet 25 mg  25 mg Oral QHS Samuella Cota, MD   25 mg at 05/05/14 2233  . aspirin EC tablet 81 mg  81 mg Oral Daily Samuella Cota, MD   81 mg at 05/06/14 2505  .  ciprofloxacin (CIPRO) tablet 250 mg  250 mg Oral BID Samuella Cota, MD   250 mg at 05/06/14 0906  . HYDROcodone-acetaminophen (NORCO/VICODIN) 5-325 MG per tablet 1 tablet  1 tablet Oral Q4H PRN Samuella Cota, MD   1 tablet at 05/06/14 1326  . insulin aspart (novoLOG) injection 0-9 Units  0-9 Units Subcutaneous TID WC Samuella Cota, MD   1 Units at 05/05/14 1636  . latanoprost (XALATAN) 0.005 % ophthalmic solution 1 drop  1 drop Both Eyes QHS Samuella Cota, MD   1 drop at 05/05/14 2233  . lidocaine (LIDODERM) 5 % 1 patch  1 patch Transdermal Q24H  Rogene Houston, MD      . loratadine (CLARITIN) tablet 10 mg  10 mg Oral Daily Samuella Cota, MD   10 mg at 05/06/14 0905  . losartan (COZAAR) tablet 25 mg  25 mg Oral Daily Samuella Cota, MD   25 mg at 05/06/14 0347  . morphine 2 MG/ML injection 2 mg  2 mg Intravenous Q4H PRN Samuella Cota, MD      . ondansetron [facility]) tablet 4 mg  4 mg Oral Q6H PRN Samuella Cota, MD       Or  . ondansetron [facility]) injection 4 mg  4 mg Intravenous Q6H PRN Samuella Cota, MD      . pantoprazole (PROTONIX) EC tablet 40 mg  40 mg Oral Daily Samuella Cota, MD   40 mg at 05/06/14 0906  . polyethylene glycol (MIRALAX / GLYCOLAX) packet 17 g  17 g Oral Daily Samuella Cota, MD   17 g at 05/06/14 0904  . sodium chloride 0.9 % injection 3 mL  3 mL Intravenous Q12H Samuella Cota, MD   3 mL at 05/06/14 0907  . sodium chloride 0.9 % injection 3 mL  3 mL Intravenous Q12H Samuella Cota, MD   3 mL at 05/05/14 0939  . sodium chloride 0.9 % injection 3 mL  3 mL Intravenous PRN Samuella Cota, MD      . topiramate (TOPAMAX) tablet 50 mg  50 mg Oral Daily Samuella Cota, MD   50 mg at 05/06/14 0905  . warfarin (COUMADIN) tablet 1.5 mg  1.5 mg Oral Once Samuella Cota, MD      . Warfarin - Pharmacist Dosing Inpatient   Does not apply Q24H Lavonia Drafts Lilliston, RPH   0  at 05/04/14 1600    Allergies as of 05/03/2014 -  Review Complete 05/03/2014  Allergen Reaction Noted  . Vioxx [rofecoxib] Shortness Of Breath 09/24/2010  . Penicillins  12/19/2012  . Codeine Rash 09/24/2010  . Motrin [ibuprofen] Rash 09/24/2010  . Sulfur Rash 09/24/2010    Family History  Problem Relation Age of Onset  . Other Mother 27    Cause unknown  . Cancer Mother     liver  . CVA Maternal Grandmother 90    deceased  . Heart disease Maternal Grandmother   . Stroke Maternal Grandmother   . Heart attack Brother 40    deceased  . Cancer Sister 67    deceased    History   Social History  . Marital Status: Married    Spouse Name: N/A    Number of Children: N/A  . Years of Education: N/A   Occupational History  . Not on file.   Social History Main Topics  . Smoking status: Former Smoker    Types: Cigarettes    Quit date: 12/10/2013  . Smokeless tobacco: Never Used     Comment: Smoke 1-1 1/2 packs a day  . Alcohol Use: No  . Drug Use: No  . Sexual Activity: Yes    Birth Control/ Protection: Surgical     Comment: partial hysterectomy   Other Topics Concern  . Not on file   Social History Narrative    Review of Systems: See HPI, otherwise normal ROS  Physical Exam: Temp:  [98.3 F (36.8 C)-98.4 F (36.9 C)] 98.3 F (36.8 C) (11/02 0625) Pulse Rate:  [57-65] 57 (11/02 0625) Resp:  [18] 18 (11/02 0625) BP: (100-104)/(39-57) 100/39 mmHg (11/02 0625)  SpO2:  [94 %-99 %] 94 % (11/02 0625) Last BM Date: 05/05/14 Patient is alert and appears to be in no acute distress. Conjunctiva was pink. Sclerae nonicteric. Oropharyngeal mucosa is normal. No neck masses or thyromegaly noted. Cardiac exam with regular rhythm normal S1 and S2. No murmur or gallop noted. Auscultation of lungs revealed was a clear breath sounds bilaterally without rales or rub. Abdomen is full. Bowel sounds are normal. No bruits noted. On palpation abdomen is soft with mild midepigastric tenderness without guarding. She has vague  tenderness in the region of right upper quadrant primarily over right costal margin. There is no focal tenderness. No organomegaly or masses.  Lab Results:  Recent Labs  05/04/14 0453 05/05/14 0548 05/06/14 0618  WBC 5.1 5.4 4.4  HGB 10.3* 10.0* 9.8*  HCT 32.1* 31.8* 30.7*  PLT 245 264 263   BMET  Recent Labs  05/04/14 0453 05/05/14 0548 05/06/14 0618  NA 146 142 144  K 4.2 4.1 4.2  CL 111 106 111  CO2 25 25 23   GLUCOSE 94 99 96  BUN 12 15 14   CREATININE 1.26* 1.44* 1.36*  CALCIUM 9.2 9.1 8.9   LFT  Recent Labs  05/06/14 0618  PROT 6.0  ALBUMIN 2.9*  AST 33  ALT 32  ALKPHOS 86  BILITOT 0.2*   PT/INR  Recent Labs  05/05/14 0548 05/06/14 0618  LABPROT 26.5* 25.0*  INR 2.42* 2.24*    Studies/Results: Ct Abdomen Pelvis Wo Contrast  05/05/2014   CLINICAL DATA:  Right upper quadrant pain, history of chronic renal disease  EXAM: CT ABDOMEN AND PELVIS WITHOUT CONTRAST  TECHNIQUE: Multidetector CT imaging of the abdomen and pelvis was performed following the standard protocol without IV contrast.  COMPARISON:  08/19/2012  FINDINGS: The lung bases are free of acute infiltrate or sizable effusion. Visualized portions of the cardiac structures are within normal limits. The gallbladder has been surgically removed. The liver, spleen, adrenal glands and pancreas are within normal limits. The kidneys are well visualized bilaterally and demonstrate multiple punctate nonobstructing renal stones. The largest of these lie on the right but still measure 1-2 mm. No ureteral calculi or obstructive changes are noted. The bladder is partially distended. The appendix is not well visualized although no right lower quadrant inflammatory changes are seen. The uterus has been surgically removed. The osseous structures are within normal limits.  IMPRESSION: Multiple nonobstructing renal calculi  No acute abnormality noted.   Electronically Signed   By: Inez Catalina M.D.   On: 05/05/2014 15:18   I have reviewed all 3 imaging studies.  Assessment;  Patient is 73 year old Caucasian female who was admitted 3 days ago with 5 day history of unrelenting right upper quadrant pain associated with nausea no vomiting. Workup has been negative including CT angio chest, upper abdominal ultrasound and finally abdominopelvic CT with contrast.lab data pertinent for normal LFTs and anemia with hemoglobin of around 10 g but no evidence of melena or rectal bleeding. Pain is responding to narcotics. Abdominal examination is unremarkable except mild midepigastric tenderness and vague tenderness over right costal margin. Suspect to be a dealing with musculoskeletal pain. Patient also has some features of pleurisy but no rub is audiblein both CT scans are negative for pleural effusion. One would expect improvement in severity of pain with bedrest but this is not the case. Symptom complex is not typical of peptic ulcer disease but if she has heme-positive stool will consider diagnostic EGD.  Recommendations; Hemoccult 1 Lidoderm  patch 12 hours on and 12 hours off. If stool is guaiac positive will consider diagnostic EGD. CBC in a.m. Patient will be reevaluated in a.m.   LOS: 3 days   Aashvi Rezabek U  05/06/2014, 2:06 PM

## 2014-05-06 NOTE — Progress Notes (Signed)
ANTICOAGULATION CONSULT NOTE -  Pharmacy Consult for Coumadin Indication: hx DVT  Allergies  Allergen Reactions  . Vioxx [Rofecoxib] Shortness Of Breath  . Penicillins     rash  . Codeine Rash  . Motrin [Ibuprofen] Rash  . Sulfur Rash   Patient Measurements: Height: 5\' 2"  (157.5 cm) Weight: 163 lb 2.3 oz (74 kg) IBW/kg (Calculated) : 50.1  Vital Signs: Temp: 98.3 F (36.8 C) (11/02 0625) Temp Source: Oral (11/02 0625) BP: 100/39 mmHg (11/02 0625) Pulse Rate: 57 (11/02 0625)  Labs:  Recent Labs  05/03/14 1712 05/03/14 2218  05/04/14 0453 05/05/14 0548 05/06/14 0618  HGB  --   --   < > 10.3* 10.0* 9.8*  HCT  --   --   --  32.1* 31.8* 30.7*  PLT  --   --   --  245 264 263  LABPROT  --   --   --  23.5* 26.5* 25.0*  INR  --   --   --  2.08* 2.42* 2.24*  CREATININE  --   --   --  1.26* 1.44* 1.36*  TROPONINI <0.30 <0.30  --  <0.30  --   --   < > = values in this interval not displayed.  Estimated Creatinine Clearance: 34.7 mL/min (by C-G formula based on Cr of 1.36).  Medical History: Past Medical History  Diagnosis Date  . Hypertension   . Diabetes mellitus     x 5 yrs  . Hypothyroidism   . DVT of axillary vein, acute left 07/24/12  . Back pain   . Vertigo     chonic  . Mixed hyperlipidemia   . GERD (gastroesophageal reflux disease)   . Glaucoma   . Sigmoid diverticulitis   . Asthmatic bronchitis   . Pinched nerve     right elbow  . Chronic kidney disease     kidney function low   Medications:  Prescriptions prior to admission  Medication Sig Dispense Refill Last Dose  . amitriptyline (ELAVIL) 25 MG tablet Take 25 mg by mouth at bedtime.   05/02/2014 at Unknown time  . cetirizine (ZYRTEC) 10 MG tablet Take 10 mg by mouth daily.   05/02/2014 at Unknown time  . esomeprazole (NEXIUM) 20 MG capsule Take 20 mg by mouth daily at 12 noon.   05/02/2014 at Unknown time  . furosemide (LASIX) 20 MG tablet Take 20 mg by mouth daily as needed (FOR FLUID  RETENTION).   05/02/2014 at Unknown time  . latanoprost (XALATAN) 0.005 % ophthalmic solution 1 drop at bedtime.   05/02/2014 at Unknown time  . losartan (COZAAR) 50 MG tablet Take 25 mg by mouth daily.   Past Week at Unknown time  . meclizine (ANTIVERT) 25 MG tablet Take 25 mg by mouth as needed for dizziness.    05/01/2014 at Unknown time  . Omega-3 Fatty Acids (FISH OIL) 1200 MG CAPS Take 1 capsule by mouth 2 (two) times daily.   Past Month at Unknown time  . polyethylene glycol (MIRALAX / GLYCOLAX) packet Take 17 g by mouth daily.   05/02/2014 at Unknown time  . potassium chloride (K-DUR) 10 MEQ tablet Take 10 mEq by mouth daily. *TO BE TAKEN WITH LASIX (FUROSEMIDE)   05/02/2014 at Unknown time  . sitaGLIPtin (JANUVIA) 50 MG tablet Take 50 mg by mouth as needed (high blood sugar).    05/02/2014 at Unknown time  . topiramate (TOPAMAX) 25 MG tablet Take 50 mg by mouth daily.  05/02/2014 at Unknown time  . warfarin (COUMADIN) 3 MG tablet Take 1.5-3 mg by mouth daily. Alternating 1.5 mg and 3 mg every other day.   05/02/2014 at Unknown time   Assessment: 21 yoF admitted with chest pain on chronic warfarin for hx DVT.  INR slightly below goal on admission.  Home dose noted above.  No bleeding noted. INR therapeutic today.  Pt is on Cipro which can interact with Coumadin to increase INR.  Goal of Therapy:  INR 2-3   Plan:  Coumadin 1.5 mg po today Daily INR  [place] Crane, Remedy Corporan A 05/06/2014,1:47 PM

## 2014-05-06 NOTE — Plan of Care (Signed)
Problem: Discharge Progression Outcomes Goal: Hemodynamically stable Outcome: Progressing

## 2014-05-06 NOTE — Plan of Care (Signed)
Problem: Discharge Progression Outcomes Goal: Tolerates diet Outcome: Progressing

## 2014-05-07 ENCOUNTER — Encounter (HOSPITAL_COMMUNITY): Payer: Self-pay | Admitting: *Deleted

## 2014-05-07 ENCOUNTER — Encounter (HOSPITAL_COMMUNITY)
Admission: EM | Disposition: A | Discharge status: Home / Self Care | Payer: Self-pay | Source: Home / Self Care | Attending: Family Medicine

## 2014-05-07 DIAGNOSIS — K2950 Unspecified chronic gastritis without bleeding: Secondary | ICD-10-CM

## 2014-05-07 DIAGNOSIS — R079 Chest pain, unspecified: Secondary | ICD-10-CM | POA: Insufficient documentation

## 2014-05-07 DIAGNOSIS — E119 Type 2 diabetes mellitus without complications: Secondary | ICD-10-CM

## 2014-05-07 DIAGNOSIS — R1011 Right upper quadrant pain: Secondary | ICD-10-CM | POA: Insufficient documentation

## 2014-05-07 HISTORY — PX: ESOPHAGOGASTRODUODENOSCOPY: SHX5428

## 2014-05-07 LAB — CBC
HCT: 31.2 % — ABNORMAL LOW (ref 36.0–46.0)
Hemoglobin: 10.2 g/dL — ABNORMAL LOW (ref 12.0–15.0)
MCH: 28.3 pg (ref 26.0–34.0)
MCHC: 32.7 g/dL (ref 30.0–36.0)
MCV: 86.4 fL (ref 78.0–100.0)
Platelets: 244 10*3/uL (ref 150–400)
RBC: 3.61 MIL/uL — ABNORMAL LOW (ref 3.87–5.11)
RDW: 13.8 % (ref 11.5–15.5)
WBC: 4.7 10*3/uL (ref 4.0–10.5)

## 2014-05-07 LAB — GLUCOSE, CAPILLARY
Glucose-Capillary: 94 mg/dL (ref 70–99)
Glucose-Capillary: 99 mg/dL (ref 70–99)
Glucose-Capillary: 155 mg/dL — ABNORMAL HIGH (ref 70–99)

## 2014-05-07 LAB — BASIC METABOLIC PANEL
ANION GAP: 12 (ref 5–15)
BUN: 14 mg/dL (ref 6–23)
CHLORIDE: 109 meq/L (ref 96–112)
CO2: 22 mEq/L (ref 19–32)
CREATININE: 1.27 mg/dL — AB (ref 0.50–1.10)
Calcium: 9.1 mg/dL (ref 8.4–10.5)
GFR, EST AFRICAN AMERICAN: 47 mL/min — AB (ref >90)
GFR, EST NON AFRICAN AMERICAN: 41 mL/min — AB (ref >90)
Glucose, Bld: 104 mg/dL — ABNORMAL HIGH (ref 70–99)
POTASSIUM: 4.1 meq/L (ref 3.7–5.3)
Sodium: 143 mEq/L (ref 137–147)

## 2014-05-07 LAB — PROTIME-INR
INR: 1.99 — AB (ref 0.00–1.49)
Prothrombin Time: 22.8 seconds — ABNORMAL HIGH (ref 11.6–15.2)

## 2014-05-07 LAB — OCCULT BLOOD X 1 CARD TO LAB, STOOL: Fecal Occult Bld: NEGATIVE

## 2014-05-07 SURGERY — EGD (ESOPHAGOGASTRODUODENOSCOPY)
Anesthesia: Moderate Sedation

## 2014-05-07 MED ORDER — STERILE WATER FOR IRRIGATION IR SOLN
Status: DC | PRN
  Administered 2014-05-07: 13:00:00

## 2014-05-07 MED ORDER — BUTAMBEN-TETRACAINE-BENZOCAINE 2-2-14 % EX AERO
INHALATION_SPRAY | CUTANEOUS | Status: DC | PRN
  Administered 2014-05-07: 2 via TOPICAL

## 2014-05-07 MED ORDER — MIDAZOLAM HCL 5 MG/5ML IJ SOLN
INTRAMUSCULAR | Status: DC | PRN
  Administered 2014-05-07 (×2): 2 mg via INTRAVENOUS
  Administered 2014-05-07: 1 mg via INTRAVENOUS

## 2014-05-07 MED ORDER — WARFARIN SODIUM 2 MG PO TABS
3.0000 mg | ORAL_TABLET | Freq: Once | ORAL | Status: AC
  Administered 2014-05-07: 3 mg via ORAL
  Filled 2014-05-07 (×2): qty 1

## 2014-05-07 MED ORDER — MEPERIDINE HCL 50 MG/ML IJ SOLN
INTRAMUSCULAR | Status: DC | PRN
  Administered 2014-05-07 (×2): 25 mg via INTRAVENOUS

## 2014-05-07 MED ORDER — SODIUM CHLORIDE 0.9 % IV SOLN
INTRAVENOUS | Status: DC
  Administered 2014-05-07: 12:00:00 via INTRAVENOUS

## 2014-05-07 MED ORDER — MIDAZOLAM HCL 5 MG/5ML IJ SOLN
INTRAMUSCULAR | Status: AC
  Filled 2014-05-07: qty 10

## 2014-05-07 MED ORDER — LIDOCAINE 5 % EX PTCH: 1.0000 | MEDICATED_PATCH | CUTANEOUS | Status: DC

## 2014-05-07 MED ORDER — CIPROFLOXACIN HCL 250 MG PO TABS: 250.0000 mg | ORAL_TABLET | Freq: Two times a day (BID) | ORAL | Status: DC

## 2014-05-07 MED ORDER — MEPERIDINE HCL 50 MG/ML IJ SOLN
INTRAMUSCULAR | Status: AC
  Filled 2014-05-07: qty 1

## 2014-05-07 NOTE — Op Note (Addendum)
EGD PROCEDURE REPORT  PATIENT:  [name]  MR#:  [id number] Birthdate:  [birth year], 73 y.o., female Endoscopist:  Dr. Rogene Houston, MD Referred By:  Dr. Murray Hodgkins, MD Procedure Date: 05/07/2014  Procedure:   EGD  Indications:  Patient is 73 year old Caucasian female with right upper quadrant pain associated with nausea and she also anemia with no evidence of GI bleed. Pain seemed to have migrated into her epigastrium. She is therefore undergoing diagnostic EGD since workup so far has been non-revealing. Patient is anticoagulated with INR 1.99.            Informed Consent:  The risks, benefits, alternatives & imponderables which include, but are not limited to, bleeding, infection, perforation, drug reaction and potential missed lesion have been reviewed.  The potential for biopsy, lesion removal, esophageal dilation, etc. have also been discussed.  Questions have been answered.  All parties agreeable.  Please see history & physical in medical record for more information.  Medications:  Demerol 50 mg IV Versed 5 mg IV Cetacaine spray topically for oropharyngeal anesthesia  Description of procedure:  The endoscope was introduced through the mouth and advanced to the second portion of the duodenum without difficulty or limitations. The mucosal surfaces were surveyed very carefully during advancement of the scope and upon withdrawal.  Findings:  Esophagus:  Mucosa of the esophagus was normal. GE junction was unremarkable. 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 GEJ:  40 cm Stomach:  Stomach was empty and distended very well with insufflation. Folds in the proximal stomach were normal. Examination of mucosa at gastric body was normal. There was patchy antral erythema without erosions or ulceration. Pyloric channel was patent. Angularis fundus and cardia were normal. Duodenum:  Normal bulbar and post bulbar mucosa.  Therapeutic/Diagnostic Maneuvers  Performed:  none  Complications:  none  Impression: Non-erosive antral gastritis otherwise normal EGD. This finding would not explain patient's symptom complex.  Recommendations:  H. Pylori serology. Continue symptomatic therapy. If pain does not resolve in a matter of one to two weeks will consider bone scan.  REHMAN,NAJEEB U  05/07/2014  1:37 PM  CC: Dr. Hilma Favors, Betsy Coder, MD & Dr. Rayne Du ref. provider found

## 2014-05-07 NOTE — Discharge Summary (Signed)
Physician Discharge Summary  [name] ZOX:[id number] DOB: [birth year] DOA: 05/03/2014  PCP: Purvis Kilts, MD  Admit date: 05/03/2014 Discharge date: 05/07/2014  Time spent: 40 minutes  Recommendations for Outpatient Follow-up:  1. Follow up with PCP for evaluation of abdominal pain. If no improvement consider bone scan. Recommend BMET to track kidney function 2. Dr Olevia Perches office will contact patient with results biopsy  Discharge Diagnoses:  Principal Problem:   RUQ pain Active Problems:   DM (diabetes mellitus)   Chest pain   Acute renal failure   Anemia   Type 2 diabetes mellitus without complication   Renal failure   Antral gastritis   Discharge Condition: stable  Diet recommendation: carb modified  Filed Weights   05/03/14 0847 05/03/14 1328  Weight: 72.576 kg (160 lb) 74 kg (163 lb 2.3 oz)    History of present illness:  73yow presented to ED on 05/03/14 with >5 day h/o right-sided chest pain, RUQ pain with radiation to back. Initial evaluation unremarkable, referred for CP evaluation  Patient reported onset of RUQ pain/right-sided chest pain 5 days prior, constant in nature, wax and wanes in intensity, at most intense very painful. Associated SOB at times but not always. Not provoked by exertion. Intensity paroxysmal in nature. No specific aggravating or alleviating factors. No fever or systemic symptoms. Hx of chole. Eating well, normal BM, no vomiting. Reported  h/o kidney stones.  Daughter added that patient  stressed by husband undergoing AAA repair recently.  In the emergency department afebrile, VSS. CMP, CBC unremarkable. Troponin negative. D-dimer negative. INR 1.71. CT chest no PE. EKG SR, RBBB, LAFB, no previous available for comparison.  Hospital Course:  1. RUQ etiology remains unclear at discharge. Minimal improvement with lidoderm. Movement and deep breathing make worse. Workup negative for PE/ infiltrate. Normal bowel movement and x-ray  argue against any obstructive component. Has ruled out for ACS. LFTs normal, lipase normal. No suspicion for renal colic. Evaluated by Dr Arnoldo Morale with general surgery who opined no surgical source of point tenderness. Evaluated by GI who suspect musculoskeletal. EGD on 05/07/14 yielding antral gastritis, non-erosive and would not explain pateints pain. GI recommends continuing symptomatic therapy and clears for discharge. GI will contact patient with lab results. GI recommends bone scan if pain not resolved in 1-2 weeks.   2. Reported CP on admission. Resolved, no recurrence. Ruled out for ACS. RBBB of unknown chronicity. Still waiting on old records from [facility]. No indication at this point for any further cardiac workup 3. DM type 2, CBG range 94-99. 4. CKD stage III, with possible acute renal failure, possibly contrast-induced. Creatinine trencing downward at discharge and is very close to baseline at discharge. Urine output good. Follow up with PCP in 1-2 weeks.  5. Chronic normocytic anemia. Hg only sightly below baseline. No s/sx bleeding. EGD as above. OP follow up.  H/o DVT. Chest CT on admission negative for PE. Continue warfarin. inr 2.24  Procedures:  EGD 05/07/2014 non-erosive antral gastritis  Consultations:  Dr Laural Golden GI  Dr Arnoldo Morale general surgery  Discharge Exam: Filed Vitals:   05/07/14 1400  BP: 115/60  Pulse: 78  Temp: 97.6 F (36.4 C)  Resp: 18    General: well nourished NAD Cardiovascular: RRR No MGR No LE edema Respiratory: normal effort BS clear bilaterally Abdomen: non-distended +BS   Discharge Instructions You were cared for by a hospitalist during your hospital stay. If you have any questions about your discharge medications or the care you  received while you were in the hospital after you are discharged, you can call the unit and asked to speak with the hospitalist on call if the hospitalist that took care of you is not available. Once you are  discharged, your primary care physician will handle any further medical issues. Please note that NO REFILLS for any discharge medications will be authorized once you are discharged, as it is imperative that you return to your primary care physician (or establish a relationship with a primary care physician if you do not have one) for your aftercare needs so that they can reassess your need for medications and monitor your lab values.   Current Discharge Medication List    START taking these medications   Details  ciprofloxacin (CIPRO) 250 MG tablet Take 1 tablet (250 mg total) by mouth 2 (two) times daily. Qty: 2 tablet, Refills: 0    lidocaine (LIDODERM) 5 % Place 1 patch onto the skin daily. Remove & Discard patch within 12 hours or as directed by MD Qty: 3 patch, Refills: 0      CONTINUE these medications which have NOT CHANGED   Details  amitriptyline (ELAVIL) 25 MG tablet Take 25 mg by mouth at bedtime.    cetirizine (ZYRTEC) 10 MG tablet Take 10 mg by mouth daily.    esomeprazole (NEXIUM) 20 MG capsule Take 20 mg by mouth daily at 12 noon.    furosemide (LASIX) 20 MG tablet Take 20 mg by mouth daily as needed (FOR FLUID RETENTION).    latanoprost (XALATAN) 0.005 % ophthalmic solution 1 drop at bedtime.    losartan (COZAAR) 50 MG tablet Take 25 mg by mouth daily.    meclizine (ANTIVERT) 25 MG tablet Take 25 mg by mouth as needed for dizziness.     Omega-3 Fatty Acids (FISH OIL) 1200 MG CAPS Take 1 capsule by mouth 2 (two) times daily.    polyethylene glycol (MIRALAX / GLYCOLAX) packet Take 17 g by mouth daily.    potassium chloride (K-DUR) 10 MEQ tablet Take 10 mEq by mouth daily. *TO BE TAKEN WITH LASIX (FUROSEMIDE)    sitaGLIPtin (JANUVIA) 50 MG tablet Take 50 mg by mouth as needed (high blood sugar).     topiramate (TOPAMAX) 25 MG tablet Take 50 mg by mouth daily.     warfarin (COUMADIN) 3 MG tablet Take 1.5-3 mg by mouth daily. Alternating 1.5 mg and 3 mg every other  day.       Allergies  Allergen Reactions  . Vioxx [Rofecoxib] Shortness Of Breath  . Penicillins     rash  . Codeine Rash  . Motrin [Ibuprofen] Rash  . Sulfur Rash   Follow-up Information    Follow up with Purvis Kilts, MD. Schedule an appointment as soon as possible for a visit in 1 week.   Specialty:  Family Medicine   Why:  for evaluation of pain.    Contact information:   [address] [place] [address] [phone]        The results of significant diagnostics from this hospitalization (including imaging, microbiology, ancillary and laboratory) are listed below for reference.    Significant Diagnostic Studies: Ct Abdomen Pelvis Wo Contrast  05/05/2014   CLINICAL DATA:  Right upper quadrant pain, history of chronic renal disease  EXAM: CT ABDOMEN AND PELVIS WITHOUT CONTRAST  TECHNIQUE: Multidetector CT imaging of the abdomen and pelvis was performed following the standard protocol without IV contrast.  COMPARISON:  08/19/2012  FINDINGS: The lung  bases are free of acute infiltrate or sizable effusion. Visualized portions of the cardiac structures are within normal limits. The gallbladder has been surgically removed. The liver, spleen, adrenal glands and pancreas are within normal limits. The kidneys are well visualized bilaterally and demonstrate multiple punctate nonobstructing renal stones. The largest of these lie on the right but still measure 1-2 mm. No ureteral calculi or obstructive changes are noted. The bladder is partially distended. The appendix is not well visualized although no right lower quadrant inflammatory changes are seen. The uterus has been surgically removed. The osseous structures are within normal limits.  IMPRESSION: Multiple nonobstructing renal calculi  No acute abnormality noted.   Electronically Signed   By: Inez Catalina M.D.   On: 05/05/2014 15:18   Dg Chest 2 View  05/03/2014   CLINICAL DATA:  Chest pain under RIGHT breast through  to back since Sunday, history asthma, hypertension, diabetes mellitus, GERD, former smoker, initial encounter  EXAM: CHEST  2 VIEW  COMPARISON:  09/24/2010  FINDINGS: Upper normal heart size.  Normal mediastinal contours and pulmonary vascularity.  Emphysematous and bronchitic changes consistent with COPD.  Atherosclerotic calcification aortic arch.  Minimal atelectasis at LEFT base.  No acute infiltrate, pleural effusion or pneumothorax.  Bones demineralized with evidence of prior cervical spine fusion.  IMPRESSION: COPD changes with minimal LEFT basilar atelectasis.   Electronically Signed   By: Lavonia Dana M.D.   On: 05/03/2014 10:21   Ct Angio Chest Pe W/cm &/or Wo Cm  05/03/2014   CLINICAL DATA:  73 year old female with right anterior chest pain radiating to the back since 04/28/2014. Initial encounter.  EXAM: CT ANGIOGRAPHY CHEST WITH CONTRAST  TECHNIQUE: Multidetector CT imaging of the chest was performed using the standard protocol during bolus administration of intravenous contrast. Multiplanar CT image reconstructions and MIPs were obtained to evaluate the vascular anatomy.  CONTRAST:  143mL OMNIPAQUE IOHEXOL 350 MG/ML SOLN  COMPARISON:  Chest CT 06/15/2005.  CT Abdomen and Pelvis 08/19/2012  FINDINGS: Good contrast bolus timing in the pulmonary arterial tree. Mild motion artifact at the lung bases. No focal filling defect identified in the pulmonary arterial tree to suggest the presence of acute pulmonary embolism.  Mildly lower lung volumes. Major airways are patent except for atelectatic changes. Dependent ground-glass and mildly confluent opacity compatible with atelectasis. No pleural effusion. Increased extrapleural fat. No consolidation or other focal pulmonary opacity.  Mediastinal lipomatosis. No pericardial effusion. Visible aorta is patent with moderate atherosclerosis. No definite calcified coronary plaque. Small anterior carina and other mediastinal lymph nodes are stable since 2006.  Negative thoracic inlet. No axillary lymphadenopathy.  Surgically absent gallbladder. Negative visualized liver, spleen, pancreas, adrenal glands, left renal upper pole, and bowel in the upper abdomen.  Osteopenia. No acute osseous abnormality identified.  Review of the MIP images confirms the above findings.  IMPRESSION: 1. No evidence of acute pulmonary embolus. 2. Atelectasis and aortic atherosclerosis.   Electronically Signed   By: Lars Pinks M.D.   On: 05/03/2014 10:54   Dg Abd 2 Views  05/03/2014   CLINICAL DATA:  Right upper quadrant pain.  EXAM: ABDOMEN - 2 VIEW  COMPARISON:  Ultrasound 09/25/2013.  CT 08/19/2012.  FINDINGS: Soft tissue structures are unremarkable. Surgical clips are noted in the right upper quadrant and pelvis. The bowel is not distended. Stool in the colon. Contrast is noted in the bladder. Lung bases are clear. Degenerative changes lumbar spine.  IMPRESSION: Nonspecific exam.  No bowel distention.  Electronically Signed   By: Marcello Moores  Register   On: 05/03/2014 17:29   US Abdomen Limited Ruq  05/04/2014   CLINICAL DATA:  Right upper quadrant pain for 1 week  EXAM: US ABDOMEN LIMITED - RIGHT UPPER QUADRANT  COMPARISON:  None.  FINDINGS: Gallbladder:  Postcholecystectomy.  Common bile duct:  Diameter: Normal at 5 mm.  Liver:  No biliary duct dilatation.  No focal lesion.  IMPRESSION: No right upper quadrant abnormality by ultrasound. Patient status post cholecystectomy.   Electronically Signed   By: Suzy Bouchard M.D.   On: 05/04/2014 10:46    Microbiology: Recent Results (from the past 240 hour(s))  Urine culture     Status: None (Preliminary result)   Collection Time: 05/05/14  9:00 AM  Result Value Ref Range Status   Specimen Description URINE, CLEAN CATCH  Final   Special Requests NONE  Final   Culture  Setup Time   Final    05/05/2014 23:30 Performed at [facility] PENDING  Incomplete   Culture   Final    Culture reincubated for better  growth Performed at [facility]    Report Status PENDING  Incomplete     Labs: Basic Metabolic Panel:  Recent Labs Lab 05/03/14 0852 05/04/14 0453 05/05/14 0548 05/06/14 0618 05/07/14 0603  NA 143 146 142 144 143  K 3.9 4.2 4.1 4.2 4.1  CL 108 111 106 111 109  CO2 25 25 25 23 22   GLUCOSE 101* 94 99 96 104*  BUN 14 12 15 14 14   CREATININE 1.16* 1.26* 1.44* 1.36* 1.27*  CALCIUM 9.5 9.2 9.1 8.9 9.1   Liver Function Tests:  Recent Labs Lab 05/03/14 0852 05/04/14 0453 05/06/14 0618  AST 19 28 33  ALT 18 23 32  ALKPHOS 96 85 86  BILITOT 0.2* 0.3 0.2*  PROT 7.2 6.3 6.0  ALBUMIN 3.6 3.1* 2.9*    Recent Labs Lab 05/03/14 0852  LIPASE 54   No results for input(s): AMMONIA in the last 168 hours. CBC:  Recent Labs Lab 05/03/14 0852 05/04/14 0453 05/05/14 0548 05/06/14 0618 05/07/14 0603  WBC 6.1 5.1 5.4 4.4 4.7  NEUTROABS 3.6  --   --   --   --   HGB 11.2* 10.3* 10.0* 9.8* 10.2*  HCT 35.4* 32.1* 31.8* 30.7* 31.2*  MCV 87.0 87.0 87.6 87.0 86.4  PLT 297 245 264 263 244   Cardiac Enzymes:  Recent Labs Lab 05/03/14 0852 05/03/14 1712 05/03/14 2218 05/04/14 0453  TROPONINI <0.30 <0.30 <0.30 <0.30   BNP: BNP (last 3 results) No results for input(s): PROBNP in the last 8760 hours. CBG:  Recent Labs Lab 05/06/14 1203 05/06/14 1655 05/06/14 2047 05/07/14 0721 05/07/14 1114  GLUCAP 107* 98 114* 94 99       Signed:  [facility] 05/07/2014, 2:24 PM

## 2014-05-07 NOTE — Progress Notes (Signed)
ANTICOAGULATION CONSULT NOTE -  Pharmacy Consult for Coumadin Indication: hx DVT  Allergies  Allergen Reactions  . Vioxx [Rofecoxib] Shortness Of Breath  . Penicillins     rash  . Codeine Rash  . Motrin [Ibuprofen] Rash  . Sulfur Rash   Patient Measurements: Height: 5\' 2"  (157.5 cm) Weight: 163 lb 2.3 oz (74 kg) IBW/kg (Calculated) : 50.1  Vital Signs: Temp: 98 F (36.7 C) (11/03 0545) Temp Source: Oral (11/03 0545) BP: 112/55 mmHg (11/03 0545) Pulse Rate: 60 (11/03 0545)  Labs:  Recent Labs  05/05/14 0548 05/06/14 0618 05/07/14 0603  HGB 10.0* 9.8* 10.2*  HCT 31.8* 30.7* 31.2*  PLT 264 263 244  LABPROT 26.5* 25.0* 22.8*  INR 2.42* 2.24* 1.99*  CREATININE 1.44* 1.36* 1.27*   Estimated Creatinine Clearance: 37.2 mL/min (by C-G formula based on Cr of 1.27).  Medical History: Past Medical History  Diagnosis Date  . Hypertension   . Diabetes mellitus     x 5 yrs  . Hypothyroidism   . DVT of axillary vein, acute left 07/24/12  . Back pain   . Vertigo     chonic  . Mixed hyperlipidemia   . GERD (gastroesophageal reflux disease)   . Glaucoma   . Sigmoid diverticulitis   . Asthmatic bronchitis   . Pinched nerve     right elbow  . Chronic kidney disease     kidney function low   Medications:  Prescriptions prior to admission  Medication Sig Dispense Refill Last Dose  . amitriptyline (ELAVIL) 25 MG tablet Take 25 mg by mouth at bedtime.   05/02/2014 at Unknown time  . cetirizine (ZYRTEC) 10 MG tablet Take 10 mg by mouth daily.   05/02/2014 at Unknown time  . esomeprazole (NEXIUM) 20 MG capsule Take 20 mg by mouth daily at 12 noon.   05/02/2014 at Unknown time  . furosemide (LASIX) 20 MG tablet Take 20 mg by mouth daily as needed (FOR FLUID RETENTION).   05/02/2014 at Unknown time  . latanoprost (XALATAN) 0.005 % ophthalmic solution 1 drop at bedtime.   05/02/2014 at Unknown time  . losartan (COZAAR) 50 MG tablet Take 25 mg by mouth daily.   Past Week at  Unknown time  . meclizine (ANTIVERT) 25 MG tablet Take 25 mg by mouth as needed for dizziness.    05/01/2014 at Unknown time  . Omega-3 Fatty Acids (FISH OIL) 1200 MG CAPS Take 1 capsule by mouth 2 (two) times daily.   Past Month at Unknown time  . polyethylene glycol (MIRALAX / GLYCOLAX) packet Take 17 g by mouth daily.   05/02/2014 at Unknown time  . potassium chloride (K-DUR) 10 MEQ tablet Take 10 mEq by mouth daily. *TO BE TAKEN WITH LASIX (FUROSEMIDE)   05/02/2014 at Unknown time  . sitaGLIPtin (JANUVIA) 50 MG tablet Take 50 mg by mouth as needed (high blood sugar).    05/02/2014 at Unknown time  . topiramate (TOPAMAX) 25 MG tablet Take 50 mg by mouth daily.    05/02/2014 at Unknown time  . warfarin (COUMADIN) 3 MG tablet Take 1.5-3 mg by mouth daily. Alternating 1.5 mg and 3 mg every other day.   05/02/2014 at Unknown time   Assessment: 66 yoF admitted with chest pain on chronic warfarin for hx DVT.  INR slightly below goal.  Home dose noted above.  No bleeding noted. H/H is stable.  Pt is on Cipro which can interact with Coumadin to increase INR.  Pt is also  on ASA  Goal of Therapy:  INR 2-3   Plan:  Coumadin 3mg  po today Daily INR  [place] Crane, Clarise Chacko A 05/07/2014,10:17 AM

## 2014-05-07 NOTE — Discharge Instructions (Signed)
Take medications as directed Dr Otelia Limes office will contact you with results of test

## 2014-05-07 NOTE — Care Management Note (Unsigned)
    Page 1 of 1   05/07/2014     11:29:41 AM CARE MANAGEMENT NOTE 05/07/2014  Patient:  [name], [name]   Account Number:  [id number]  Date Initiated:  05/06/2014  Documentation initiated by:  Vladimir Creeks  Subjective/Objective Assessment:   Admitted with abd pain, and ARF, mild. Pt is from home with spouse, is independent,  and will be returning home at D/C     Action/Plan:   no needs identified   Anticipated DC Date:  05/07/2014   Anticipated DC Plan:  [place]  CM consult      Choice offered to / List presented to:             Status of service:  In process, will continue to follow Medicare Important Message given?   (If response is "NO", the following Medicare IM given date fields will be blank) Date Medicare IM given:   Medicare IM given by:   Date Additional Medicare IM given:   Additional Medicare IM given by:    Discharge Disposition:    Per UR Regulation:  Reviewed for med. necessity/level of care/duration of stay  If discussed at [place] of Stay Meetings, dates discussed:    Comments:  05/07/14 [place] RN/CM

## 2014-05-07 NOTE — Progress Notes (Signed)
Pt vital signs stable upon discharge. IV removed. Site clean, dry, and intact. Reviewed discharge instructions and paperwork with pt. Pt verbalized understanding through teach back. Pt also given prescription for lidocaine and told to pick up her ciprofloxacin from the pharmacy.

## 2014-05-07 NOTE — Progress Notes (Signed)
Patient states she woke up with sharp pain on 5:30 AM and had to take pain medication. She complains of nausea. This morning she complains of pain primarily in epigastric region. She denies vomiting melena or rectal bleeding.  Hemoccult pending. INR 1.99 WBC 4.7, H&H 10.2 and 31.2 and platelet count 244K.  Assessment; Assistant right upper quadrant pain which also involves rib cage and epigastric region. Extensive workup was negative. Suspect musculoskeletal pain. Since she is having nausea and H&H is low we will proceed with diagnostic EGD which would be diagnostic procedure only allowed biopsy since she is anticoagulated. Patient is agreeable.

## 2014-05-07 NOTE — Progress Notes (Signed)
[facility] PROGRESS NOTE  [name] HQI:[id number] DOB: 26-Apr-1941 DOA: 05/03/2014 PCP: Purvis Kilts, MD  Assessment/Plan: 1. RUQ etiology remains unclear. Persistent but minimal improvement with lidoderm.  Movement and deep breathing make worse. Workup negative for PE/ infiltrate. Normal bowel movement and x-ray argue against any obstructive component. Has ruled out for ACS. LFTs normal, lipase normal. No suspicion for renal colic. Evaluated by Dr Arnoldo Morale with general surgery who opined no surgical source of point tenderness. Evaluated by GI who suspect musculoskeletal. EGD today for diagnostic purposes given nausea and anemia.   2. Reported CP on admission. Resolved, no recurrence. Ruled out for ACS. RBBB of unknown chronicity. Still waiting on old records from [place]. No indication at this point for any further cardiac workup 3. DM type 2, CBG range 94-99. 4. CKD stage III, with possible acute renal failure, possibly contrast-induced. Creatinine continues to trend down. Baseline appears to be 1.1-1.2. Currently 1.2. Urine output good. Will monitor. 5. Chronic normocytic anemia. Hg only sightly below baseline.  No s/sx bleeding. EGD today monitor.  1. H/o DVT. Chest CT on admission negative for PE. Continue warfarin. inr 2.24  Code Status: full Family Communication: none present Disposition Plan: hopefully home tomorrow   Consultants:  Dr Arnoldo Morale general surgery  Dr Laural Golden GI  Procedures:  EGD  Antibiotics:  none  HPI/Subjective: Sitting on side of bed crocheting. Reports continued pain but some improvement with lidoderm  Objective: Filed Vitals:   05/07/14 1218  BP: 122/65  Pulse: 67  Temp: 98.4 F (36.9 C)  Resp: 20    Intake/Output Summary (Last 24 hours) at 05/07/14 1225 Last data filed at 05/07/14 1130  Gross per 24 hour  Intake    125 ml  Output   4050 ml  Net  -3925 ml   Filed Weights   05/03/14 0847 05/03/14 1328  Weight:  72.576 kg (160 lb) 74 kg (163 lb 2.3 oz)    Exam:   General:  Well nourished appears comfortable  Cardiovascular: RRR , no MGR no LE edema PPP  Respiratory: normal effort BS clear to ausculation bilaterally no wheeze  Abdomen: obese but soft mild tenderness to RUQ otherwise non-tender +BS  Musculoskeletal: joints without swelling/erythema   Data Reviewed: Basic Metabolic Panel:  Recent Labs Lab 05/03/14 0852 05/04/14 0453 05/05/14 0548 05/06/14 0618 05/07/14 0603  NA 143 146 142 144 143  K 3.9 4.2 4.1 4.2 4.1  CL 108 111 106 111 109  CO2 25 25 25 23 22   GLUCOSE 101* 94 99 96 104*  BUN 14 12 15 14 14   CREATININE 1.16* 1.26* 1.44* 1.36* 1.27*  CALCIUM 9.5 9.2 9.1 8.9 9.1   Liver Function Tests:  Recent Labs Lab 05/03/14 0852 05/04/14 0453 05/06/14 0618  AST 19 28 33  ALT 18 23 32  ALKPHOS 96 85 86  BILITOT 0.2* 0.3 0.2*  PROT 7.2 6.3 6.0  ALBUMIN 3.6 3.1* 2.9*    Recent Labs Lab 05/03/14 0852  LIPASE 54   No results for input(s): AMMONIA in the last 168 hours. CBC:  Recent Labs Lab 05/03/14 0852 05/04/14 0453 05/05/14 0548 05/06/14 0618 05/07/14 0603  WBC 6.1 5.1 5.4 4.4 4.7  NEUTROABS 3.6  --   --   --   --   HGB 11.2* 10.3* 10.0* 9.8* 10.2*  HCT 35.4* 32.1* 31.8* 30.7* 31.2*  MCV 87.0 87.0 87.6 87.0 86.4  PLT 297 245 264 263 244   Cardiac Enzymes:  Recent Labs  Lab 05/03/14 0852 05/03/14 1712 05/03/14 2218 05/04/14 0453  TROPONINI <0.30 <0.30 <0.30 <0.30   BNP (last 3 results) No results for input(s): PROBNP in the last 8760 hours. CBG:  Recent Labs Lab 05/06/14 1203 05/06/14 1655 05/06/14 2047 05/07/14 0721 05/07/14 1114  GLUCAP 107* 98 114* 94 99    Recent Results (from the past 240 hour(s))  Urine culture     Status: None (Preliminary result)   Collection Time: 05/05/14  9:00 AM  Result Value Ref Range Status   Specimen Description URINE, CLEAN CATCH  Final   Special Requests NONE  Final   Culture  Setup Time    Final    05/05/2014 23:30 Performed at [facility] PENDING  Incomplete   Culture   Final    Culture reincubated for better growth Performed at [facility]    Report Status PENDING  Incomplete     Studies: Ct Abdomen Pelvis Wo Contrast  05/05/2014   CLINICAL DATA:  Right upper quadrant pain, history of chronic renal disease  EXAM: CT ABDOMEN AND PELVIS WITHOUT CONTRAST  TECHNIQUE: Multidetector CT imaging of the abdomen and pelvis was performed following the standard protocol without IV contrast.  COMPARISON:  08/19/2012  FINDINGS: The lung bases are free of acute infiltrate or sizable effusion. Visualized portions of the cardiac structures are within normal limits. The gallbladder has been surgically removed. The liver, spleen, adrenal glands and pancreas are within normal limits. The kidneys are well visualized bilaterally and demonstrate multiple punctate nonobstructing renal stones. The largest of these lie on the right but still measure 1-2 mm. No ureteral calculi or obstructive changes are noted. The bladder is partially distended. The appendix is not well visualized although no right lower quadrant inflammatory changes are seen. The uterus has been surgically removed. The osseous structures are within normal limits.  IMPRESSION: Multiple nonobstructing renal calculi  No acute abnormality noted.   Electronically Signed   By: Inez Catalina M.D.   On: 05/05/2014 15:18    Scheduled Meds: . [MAR Hold] amitriptyline  25 mg Oral QHS  . [MAR Hold] aspirin EC  81 mg Oral Daily  . [MAR Hold] ciprofloxacin  250 mg Oral BID  . [MAR Hold] insulin aspart  0-9 Units Subcutaneous TID WC  . [MAR Hold] latanoprost  1 drop Both Eyes QHS  . [MAR Hold] lidocaine  1 patch Transdermal Daily  . [MAR Hold] loratadine  10 mg Oral Daily  . [MAR Hold] losartan  25 mg Oral Daily  . [MAR Hold] pantoprazole  40 mg Oral Daily  . [MAR Hold] polyethylene glycol  17 g Oral Daily  .  [MAR Hold] sodium chloride  3 mL Intravenous Q12H  . [MAR Hold] topiramate  50 mg Oral Daily  . [MAR Hold] warfarin  3 mg Oral Once  . [MAR Hold] Warfarin - Pharmacist Dosing Inpatient   Does not apply Q24H   Continuous Infusions: . sodium chloride 125 mL/hr at 05/07/14 0557  . sodium chloride 20 mL/hr at 05/07/14 1222    Principal Problem:   RUQ pain Active Problems:   DM (diabetes mellitus)   Chest pain   Acute renal failure   Anemia   Type 2 diabetes mellitus without complication   Renal failure    Time spent: 35 minutes    [facility] Pager [phone]. If 7PM-7AM, please contact night-coverage at [email or web address], password [facility] 05/07/2014, 12:25 PM  LOS: 4 days  6. RUQ etiology remains unclear. Persistent but minimal improvement with lidoderm. Movement and deep breathing make worse. Workup negative for PE/ infiltrate. Normal bowel movement and x-ray argue against any obstructive component. Has ruled out for ACS. LFTs normal, lipase normal. No suspicion for renal colic. Evaluated by Dr Arnoldo Morale with general surgery who opined no surgical source of point tenderness. Evaluated by GI who suspect musculoskeletal. EGD today for diagnostic purposes given nausea and anemia.  7. Reported CP on admission. Resolved, no recurrence. Ruled out for ACS. RBBB of unknown chronicity. Still waiting on old records from [place]. No indication at this point for any further cardiac workup 8. DM type 2, CBG range 94-99. 9. CKD stage III, with possible acute renal failure, possibly contrast-induced. Creatinine continues to trend down. Baseline appears to be 1.1-1.2. Currently 1.2. Urine output good. Will monitor. 10. Chronic normocytic anemia. Hg only sightly below baseline. No s/sx bleeding. EGD today monitor.  2. H/o DVT. Chest CT on admission negative for PE. Continue warfarin. inr 2.24

## 2014-05-08 LAB — H. PYLORI ANTIBODY, IGG: H PYLORI IGG: 0.7 {ISR}

## 2014-05-09 ENCOUNTER — Encounter (HOSPITAL_COMMUNITY): Payer: Self-pay | Admitting: Internal Medicine

## 2014-05-09 LAB — URINE CULTURE: Colony Count: >=100000

## 2014-05-09 NOTE — Progress Notes (Signed)
[facility] PROGRESS NOTE  KELLEN HOVER TWK:[id number] DOB: 1941/03/06 DOA: 05/03/2014 PCP: Purvis Kilts, MD   I have called/updated [name] about urine cultures results. I have recommended to repeat urine analysis, and also recommended to see with her PCP or come to ED for reevaluation and treatment for resistant UTI;   Kinnie Feil  [facility] Pager [phone]. If 7PM-7AM, please contact night-coverage at [email or web address], password [facility] 05/09/2014, 2:43 PM  LOS: 4 days

## 2014-05-16 ENCOUNTER — Encounter (HOSPITAL_COMMUNITY): Payer: Self-pay | Admitting: Internal Medicine

## 2014-06-13 ENCOUNTER — Encounter: Payer: Self-pay | Admitting: Obstetrics and Gynecology

## 2014-06-13 ENCOUNTER — Ambulatory Visit (INDEPENDENT_AMBULATORY_CARE_PROVIDER_SITE_OTHER): Payer: Medicare Other | Admitting: Obstetrics and Gynecology

## 2014-06-13 VITALS — BP 160/90 | Ht 62.5 in | Wt 160.0 lb

## 2014-06-13 DIAGNOSIS — Z01419 Encounter for gynecological examination (general) (routine) without abnormal findings: Secondary | ICD-10-CM

## 2014-06-13 DIAGNOSIS — Z0000 Encounter for general adult medical examination without abnormal findings: Secondary | ICD-10-CM | POA: Insufficient documentation

## 2014-06-13 MED ORDER — VENLAFAXINE HCL ER 37.5 MG PO CP24
37.5000 mg | ORAL_CAPSULE | Freq: Every day | ORAL | Status: DC
Start: 2014-06-13 — End: 2015-04-16

## 2014-06-13 NOTE — Addendum Note (Signed)
Addended by: [name] on: 06/13/2014 02:39 PM   Modules accepted: Orders, Level of Service

## 2014-06-13 NOTE — Progress Notes (Addendum)
Patient ID: [name], female   DOB: 1941/01/02, 73 y.o.   MRN: [id number]  Assessment:  Annual Gyn Exam CT scan reviewed; no pelvic abnormalities noted Vasomotor sx, trial effexor. Plan:  1. pap smear done, next pap due 3 years 2. return annually or prn 3    Annual mammogram advised 4.  Subjective:  [name] is a 73 y.o. female G3P3 who presents for annual exam. No LMP recorded. Patient has had a hysterectomy. The patient has complaints today of hot flashes.  She states that she has three kidney stones on her right kidney.  She takes Miralax everyday to treat chronic constipation.  She has a history of abdominal hysterectomy and colostomy closure in 2008.    The following portions of the patient's history were reviewed and updated as appropriate: allergies, current medications, past family history, past medical history, past social history, past surgical history and problem list. Past Medical History  Diagnosis Date  . Hypertension   . Diabetes mellitus     x 5 yrs  . Hypothyroidism   . DVT of axillary vein, acute left 07/24/12  . Back pain   . Vertigo     chonic  . Mixed hyperlipidemia   . GERD (gastroesophageal reflux disease)   . Glaucoma   . Sigmoid diverticulitis   . Asthmatic bronchitis   . Pinched nerve     right elbow  . Chronic kidney disease     kidney function low  . Antral gastritis     EGD 11/15    Past Surgical History  Procedure Laterality Date  . Other surgical history      colostomy, colostomy reversal, for diverticulitis surgical hernia repair, arm surgery, neck surgery  . Abdominal hysterectomy    . Fracture left foot    . Eye surgery    . Back surgery      spinal   . Cholecystectomy    . Colon surgery    . Hernia repair    . Colostomy closure    . Colonoscopy N/A 12/27/2013    Procedure: COLONOSCOPY;  Surgeon: Rogene Houston, MD;  [facility];  Service: Endoscopy;  Laterality: N/A;  200  . Esophagogastroduodenoscopy N/A  05/07/2014    Procedure: ESOPHAGOGASTRODUODENOSCOPY (EGD);  Surgeon: Rogene Houston, MD;  [facility];  Service: Endoscopy;  Laterality: N/A;    Current outpatient prescriptions: amitriptyline (ELAVIL) 25 MG tablet, Take 25 mg by mouth at bedtime., Disp: , Rfl: ;  cetirizine (ZYRTEC) 10 MG tablet, Take 10 mg by mouth daily., Disp: , Rfl: ;  esomeprazole (NEXIUM) 20 MG capsule, Take 20 mg by mouth daily at 12 noon., Disp: , Rfl: ;  furosemide (LASIX) 20 MG tablet, Take 20 mg by mouth daily as needed (FOR FLUID RETENTION)., Disp: , Rfl:  latanoprost (XALATAN) 0.005 % ophthalmic solution, 1 drop at bedtime., Disp: , Rfl: ;  lidocaine (LIDODERM) 5 %, Place 1 patch onto the skin daily. Remove & Discard patch within 12 hours or as directed by MD, Disp: 3 patch, Rfl: 0;  losartan (COZAAR) 50 MG tablet, Take 25 mg by mouth daily., Disp: , Rfl: ;  meclizine (ANTIVERT) 25 MG tablet, Take 25 mg by mouth as needed for dizziness. , Disp: , Rfl:  Omega-3 Fatty Acids (FISH OIL) 1200 MG CAPS, Take 1 capsule by mouth 2 (two) times daily., Disp: , Rfl: ;  polyethylene glycol (MIRALAX / GLYCOLAX) packet, Take 17 g by mouth daily., Disp: ,  Rfl: ;  potassium chloride (K-DUR) 10 MEQ tablet, Take 10 mEq by mouth daily. *TO BE TAKEN WITH LASIX (FUROSEMIDE), Disp: , Rfl: ;  sitaGLIPtin (JANUVIA) 50 MG tablet, Take 50 mg by mouth as needed (high blood sugar). , Disp: , Rfl:  topiramate (TOPAMAX) 25 MG tablet, Take 25 mg by mouth daily. , Disp: , Rfl: ;  warfarin (COUMADIN) 3 MG tablet, Take 4 mg by mouth daily. Alternating 1.5 mg and 3 mg every other day., Disp: , Rfl:   Review of Systems Constitutional: negative Gastrointestinal: negative Genitourinary: negative  Objective:  BP 160/90 mmHg  Ht 5' 2.5" (1.588 m)  Wt 160 lb (72.576 kg)  BMI 28.78 kg/m2   BMI: Body mass index is 28.78 kg/(m^2).  General Appearance: Alert, appropriate appearance for age. No acute distress HEENT: Grossly normal Neck / Thyroid:   Cardiovascular: RRR; normal S1, S2, no murmur Lungs: CTA bilaterally Back: No CVAT Breast Exam: No dimpling, nipple retraction or discharge. No masses or nodes. Even tissues Gastrointestinal: Soft, non-tender, no masses or organomegaly Pelvic Exam: External genitalia: normal general appearance Vaginal: normal mucosa without prolapse or lesions and normal without tenderness, induration or masses, atrophic vaginal tissues, vaginally length excellent and well supported Cervix: removed surgically Adnexa: removed surgically Uterus: removed surgically Rectovaginal: normal rectal, no masses, moderate rectocele, stable; controlled with Miralax Lymphatic Exam: Non-palpable nodes in neck, clavicular, axillary, or inguinal regions Skin: no rash or abnormalities Neurologic: Normal gait and speech, no tremor  Psychiatric: Alert and oriented, appropriate affect.  Urinalysis:Not done Hemoccult: Negative  Mallory Shirk. MD Pgr [phone] 2:12 PM  This chart was scribed for [name], MD by Donato Schultz, ED Scribe. This patient was seen in Room 2 and the patient's care was started at 2:12 PM.

## 2014-06-21 ENCOUNTER — Encounter (INDEPENDENT_AMBULATORY_CARE_PROVIDER_SITE_OTHER): Payer: Self-pay

## 2014-07-09 ENCOUNTER — Ambulatory Visit (INDEPENDENT_AMBULATORY_CARE_PROVIDER_SITE_OTHER): Payer: Medicare Other | Admitting: Urology

## 2014-07-09 DIAGNOSIS — N390 Urinary tract infection, site not specified: Secondary | ICD-10-CM | POA: Diagnosis not present

## 2014-07-09 DIAGNOSIS — N200 Calculus of kidney: Secondary | ICD-10-CM

## 2014-07-11 DIAGNOSIS — N183 Chronic kidney disease, stage 3 (moderate): Secondary | ICD-10-CM | POA: Diagnosis not present

## 2014-07-11 DIAGNOSIS — I10 Essential (primary) hypertension: Secondary | ICD-10-CM | POA: Diagnosis not present

## 2014-07-11 DIAGNOSIS — R809 Proteinuria, unspecified: Secondary | ICD-10-CM | POA: Diagnosis not present

## 2014-07-11 DIAGNOSIS — D649 Anemia, unspecified: Secondary | ICD-10-CM | POA: Diagnosis not present

## 2014-07-11 DIAGNOSIS — E559 Vitamin D deficiency, unspecified: Secondary | ICD-10-CM | POA: Diagnosis not present

## 2014-07-16 DIAGNOSIS — N202 Calculus of kidney with calculus of ureter: Secondary | ICD-10-CM | POA: Diagnosis not present

## 2014-07-16 DIAGNOSIS — N183 Chronic kidney disease, stage 3 (moderate): Secondary | ICD-10-CM | POA: Diagnosis not present

## 2014-07-16 DIAGNOSIS — D638 Anemia in other chronic diseases classified elsewhere: Secondary | ICD-10-CM | POA: Diagnosis not present

## 2014-07-16 DIAGNOSIS — I10 Essential (primary) hypertension: Secondary | ICD-10-CM | POA: Diagnosis not present

## 2014-07-25 ENCOUNTER — Ambulatory Visit (INDEPENDENT_AMBULATORY_CARE_PROVIDER_SITE_OTHER): Payer: Medicare Other | Admitting: Obstetrics and Gynecology

## 2014-07-25 ENCOUNTER — Ambulatory Visit: Payer: Medicare Other | Admitting: Obstetrics and Gynecology

## 2014-07-25 ENCOUNTER — Encounter: Payer: Self-pay | Admitting: Obstetrics and Gynecology

## 2014-07-25 VITALS — BP 120/82 | Ht 62.5 in | Wt 165.5 lb

## 2014-07-25 DIAGNOSIS — N951 Menopausal and female climacteric states: Secondary | ICD-10-CM

## 2014-07-25 MED ORDER — ESTRADIOL 0.52 MG/0.87 GM (0.06%) TD GEL: 1.0000 "application " | Freq: Every day | TRANSDERMAL | Status: DC

## 2014-07-25 NOTE — Progress Notes (Signed)
Patient ID: [name], female   DOB: [birth year], 74 y.o.   MRN: [id number] Pt here today for follow up on medication that was given to her the last visit. Pt states that the medication has not helped her at all. Pt states that the hot flashes are no better and maybe even worse.    [facility] Visit  Patient name: [name] MRN [id number]  Date of birth: 08/02/1940  CC & HPI:  [name] is a 74 y.o. female presenting today for followup vasomotor sx getting worse, tried effexor.37.5 mg.no relief  ROS:  Has sweat night and day.  Pertinent History Reviewed:   Reviewed: Significant for no medication changes recently  Medical         Past Medical History  Diagnosis Date  . Hypertension   . Diabetes mellitus     x 5 yrs  . Hypothyroidism   . DVT of axillary vein, acute left 07/24/12  . Back pain   . Vertigo     chonic  . Mixed hyperlipidemia   . GERD (gastroesophageal reflux disease)   . Glaucoma   . Sigmoid diverticulitis   . Asthmatic bronchitis   . Pinched nerve     right elbow  . Chronic kidney disease     kidney function low  . Antral gastritis     EGD 11/15  . Kidney stones                               Surgical Hx:    Past Surgical History  Procedure Laterality Date  . Other surgical history      colostomy, colostomy reversal, for diverticulitis surgical hernia repair, arm surgery, neck surgery  . Abdominal hysterectomy    . Fracture left foot    . Eye surgery    . Back surgery      spinal   . Cholecystectomy    . Colon surgery    . Hernia repair    . Colostomy closure    . Colonoscopy N/A 12/27/2013    Procedure: COLONOSCOPY;  Surgeon: Rogene Houston, MD;  [facility];  Service: Endoscopy;  Laterality: N/A;  200  . Esophagogastroduodenoscopy N/A 05/07/2014    Procedure: ESOPHAGOGASTRODUODENOSCOPY (EGD);  Surgeon: Rogene Houston, MD;  [facility];  Service: Endoscopy;  Laterality: N/A;   Medications: Reviewed &  Updated - see associated section                       Current outpatient prescriptions:  .  amitriptyline (ELAVIL) 25 MG tablet, Take 25 mg by mouth at bedtime., Disp: , Rfl:  .  cetirizine (ZYRTEC) 10 MG tablet, Take 10 mg by mouth daily., Disp: , Rfl:  .  esomeprazole (NEXIUM) 20 MG capsule, Take 20 mg by mouth daily at 12 noon., Disp: , Rfl:  .  furosemide (LASIX) 20 MG tablet, Take 20 mg by mouth daily as needed (FOR FLUID RETENTION)., Disp: , Rfl:  .  latanoprost (XALATAN) 0.005 % ophthalmic solution, 1 drop at bedtime., Disp: , Rfl:  .  losartan (COZAAR) 50 MG tablet, Take 25 mg by mouth daily., Disp: , Rfl:  .  meclizine (ANTIVERT) 25 MG tablet, Take 25 mg by mouth as needed for dizziness. , Disp: , Rfl:  .  polyethylene glycol (MIRALAX / GLYCOLAX) packet, Take 17 g by mouth daily., Disp: ,  Rfl:  .  potassium chloride (K-DUR) 10 MEQ tablet, Take 10 mEq by mouth daily. *TO BE TAKEN WITH LASIX (FUROSEMIDE), Disp: , Rfl:  .  sitaGLIPtin (JANUVIA) 50 MG tablet, Take 50 mg by mouth as needed (high blood sugar). , Disp: , Rfl:  .  topiramate (TOPAMAX) 25 MG tablet, Take 25 mg by mouth daily. , Disp: , Rfl:  .  venlafaxine XR (EFFEXOR XR) 37.5 MG 24 hr capsule, Take 1 capsule (37.5 mg total) by mouth daily with breakfast., Disp: 30 capsule, Rfl: 3 .  warfarin (COUMADIN) 3 MG tablet, Take 4 mg by mouth daily. Alternating 1.5 mg and 3 mg every other day., Disp: , Rfl:  .  lidocaine (LIDODERM) 5 %, Place 1 patch onto the skin daily. Remove & Discard patch within 12 hours or as directed by MD (Patient not taking: Reported on 07/25/2014), Disp: 3 patch, Rfl: 0 .  Omega-3 Fatty Acids (FISH OIL) 1200 MG CAPS, Take 1 capsule by mouth 2 (two) times daily., Disp: , Rfl:    Social History: Reviewed -  reports that she quit smoking about 7 months ago. Her smoking use included Cigarettes. She has never used smokeless tobacco.  Objective Findings:  Vitals: Blood pressure 120/82, height 5' 2.5" (1.588 m),  weight 165 lb 8 oz (75.07 kg).  Physical Examination: General appearance - alert, well appearing, and in no distress, oriented to person, place, and time and overweight Mental status - alert, oriented to person, place, and time, normal mood, behavior, speech, dress, motor activity, and thought processes   Assessment & Plan:   A:  1. Vasomotor sx , excessively symptomatic to pt   P:  1. Trial elestrin topical

## 2014-08-15 ENCOUNTER — Encounter: Payer: Self-pay | Admitting: Obstetrics and Gynecology

## 2014-08-15 ENCOUNTER — Ambulatory Visit (INDEPENDENT_AMBULATORY_CARE_PROVIDER_SITE_OTHER): Payer: Medicare Other | Admitting: Obstetrics and Gynecology

## 2014-08-15 VITALS — BP 130/78 | Ht 62.5 in | Wt 165.0 lb

## 2014-08-15 DIAGNOSIS — N951 Menopausal and female climacteric states: Secondary | ICD-10-CM

## 2014-08-15 NOTE — Patient Instructions (Signed)
Discontinue topical estrogen.

## 2014-08-15 NOTE — Progress Notes (Signed)
Patient ID: [name], female   DOB: 30-Jan-1941, 74 y.o.   MRN: [id number] Pt here today for follow up on medication she was given her last visit. Pt states that she has noticed a difference.    [facility] Visit  Patient name: [name] MRN [id number]  Date of birth: 04-13-1941  CC & HPI:  ARIYONA EID is a 74 y.o. female presenting today for followup of vasomotor sx. Pt has been on elestrin recently. Prior hx of DVT noted. Will d/c HT.   ROS:  Pt interested in herbal options. I will notify  Larene Pickett of her med change, and see if he wishes to consider clonidine as antihypertensive that would possibly reduce vasomotor sx.  Pertinent History Reviewed:   Reviewed: Significant for no recent clots. But will be on permanent anticoagulation. Medical         Past Medical History  Diagnosis Date  . Hypertension   . Diabetes mellitus     x 5 yrs  . Hypothyroidism   . DVT of axillary vein, acute left 07/24/12  . Back pain   . Vertigo     chonic  . Mixed hyperlipidemia   . GERD (gastroesophageal reflux disease)   . Glaucoma   . Sigmoid diverticulitis   . Asthmatic bronchitis   . Pinched nerve     right elbow  . Chronic kidney disease     kidney function low  . Antral gastritis     EGD 11/15  . Kidney stones                               Surgical Hx:    Past Surgical History  Procedure Laterality Date  . Other surgical history      colostomy, colostomy reversal, for diverticulitis surgical hernia repair, arm surgery, neck surgery  . Abdominal hysterectomy    . Fracture left foot    . Eye surgery    . Back surgery      spinal   . Cholecystectomy    . Colon surgery    . Hernia repair    . Colostomy closure    . Colonoscopy N/A 12/27/2013    Procedure: COLONOSCOPY;  Surgeon: Rogene Houston, MD;  [facility];  Service: Endoscopy;  Laterality: N/A;  200  . Esophagogastroduodenoscopy N/A 05/07/2014    Procedure: ESOPHAGOGASTRODUODENOSCOPY (EGD);   Surgeon: Rogene Houston, MD;  [facility];  Service: Endoscopy;  Laterality: N/A;   Medications: Reviewed & Updated - see associated section                       Current outpatient prescriptions:  .  amitriptyline (ELAVIL) 25 MG tablet, Take 25 mg by mouth at bedtime., Disp: , Rfl:  .  cetirizine (ZYRTEC) 10 MG tablet, Take 10 mg by mouth daily., Disp: , Rfl:  .  esomeprazole (NEXIUM) 20 MG capsule, Take 20 mg by mouth daily at 12 noon., Disp: , Rfl:  .  Estradiol 0.52 MG/0.87 GM (0.06%) GEL, Apply 1 application topically daily., Disp: 1 Bottle, Rfl: 3 .  furosemide (LASIX) 20 MG tablet, Take 20 mg by mouth daily as needed (FOR FLUID RETENTION)., Disp: , Rfl:  .  latanoprost (XALATAN) 0.005 % ophthalmic solution, 1 drop at bedtime., Disp: , Rfl:  .  lidocaine (LIDODERM) 5 %, Place 1 patch onto the skin daily. Remove &  Discard patch within 12 hours or as directed by MD, Disp: 3 patch, Rfl: 0 .  losartan (COZAAR) 50 MG tablet, Take 25 mg by mouth daily., Disp: , Rfl:  .  meclizine (ANTIVERT) 25 MG tablet, Take 25 mg by mouth as needed for dizziness. , Disp: , Rfl:  .  Omega-3 Fatty Acids (FISH OIL) 1200 MG CAPS, Take 1 capsule by mouth 2 (two) times daily., Disp: , Rfl:  .  polyethylene glycol (MIRALAX / GLYCOLAX) packet, Take 17 g by mouth daily., Disp: , Rfl:  .  potassium chloride (K-DUR) 10 MEQ tablet, Take 10 mEq by mouth daily. *TO BE TAKEN WITH LASIX (FUROSEMIDE), Disp: , Rfl:  .  topiramate (TOPAMAX) 25 MG tablet, Take 25 mg by mouth daily. , Disp: , Rfl:  .  venlafaxine XR (EFFEXOR XR) 37.5 MG 24 hr capsule, Take 1 capsule (37.5 mg total) by mouth daily with breakfast., Disp: 30 capsule, Rfl: 3 .  warfarin (COUMADIN) 3 MG tablet, Take 4 mg by mouth daily. Alternating 1.5 mg and 3 mg every other day., Disp: , Rfl:  .  sitaGLIPtin (JANUVIA) 50 MG tablet, Take 50 mg by mouth as needed (high blood sugar). , Disp: , Rfl:    Social History: Reviewed -  reports that she quit  smoking about 8 months ago. Her smoking use included Cigarettes. She has never used smokeless tobacco.  Objective Findings:  Vitals: Blood pressure 130/78, height 5' 2.5" (1.588 m), weight 165 lb (74.844 kg).  Physical Examination: General appearance - alert, well appearing, and in no distress, oriented to person, place, and time and overweight Mental status - alert, oriented to person, place, and time, normal mood, behavior, speech, dress, motor activity, and thought processes   Assessment & Plan:   A:  1. Contraindication to HT usage,  P:  1. D/c HT topical pt agrees. 2. Dr Hilma Favors to be notified of clonidine option.

## 2014-08-16 DIAGNOSIS — Z6830 Body mass index (BMI) 30.0-30.9, adult: Secondary | ICD-10-CM | POA: Diagnosis not present

## 2014-08-16 DIAGNOSIS — I10 Essential (primary) hypertension: Secondary | ICD-10-CM | POA: Diagnosis not present

## 2014-08-16 DIAGNOSIS — E782 Mixed hyperlipidemia: Secondary | ICD-10-CM | POA: Diagnosis not present

## 2014-08-16 DIAGNOSIS — E119 Type 2 diabetes mellitus without complications: Secondary | ICD-10-CM | POA: Diagnosis not present

## 2014-08-16 DIAGNOSIS — E6609 Other obesity due to excess calories: Secondary | ICD-10-CM | POA: Diagnosis not present

## 2014-08-20 DIAGNOSIS — Z7901 Long term (current) use of anticoagulants: Secondary | ICD-10-CM | POA: Diagnosis not present

## 2014-08-23 ENCOUNTER — Ambulatory Visit: Payer: Medicare Other | Admitting: Obstetrics and Gynecology

## 2014-09-16 ENCOUNTER — Other Ambulatory Visit (HOSPITAL_COMMUNITY): Payer: Self-pay | Admitting: Family Medicine

## 2014-09-16 ENCOUNTER — Ambulatory Visit (HOSPITAL_COMMUNITY)
Admission: RE | Admit: 2014-09-16 | Discharge: 2014-09-16 | Disposition: A | Discharge status: Home / Self Care | Payer: Medicare Other | Source: Ambulatory Visit | Attending: Family Medicine | Admitting: Family Medicine

## 2014-09-16 DIAGNOSIS — M7989 Other specified soft tissue disorders: Secondary | ICD-10-CM | POA: Diagnosis not present

## 2014-09-16 DIAGNOSIS — M79604 Pain in right leg: Secondary | ICD-10-CM | POA: Diagnosis not present

## 2014-09-16 DIAGNOSIS — Z6830 Body mass index (BMI) 30.0-30.9, adult: Secondary | ICD-10-CM | POA: Diagnosis not present

## 2014-09-16 DIAGNOSIS — Z7901 Long term (current) use of anticoagulants: Secondary | ICD-10-CM | POA: Diagnosis not present

## 2014-09-28 DIAGNOSIS — E119 Type 2 diabetes mellitus without complications: Secondary | ICD-10-CM | POA: Diagnosis not present

## 2014-10-18 DIAGNOSIS — I10 Essential (primary) hypertension: Secondary | ICD-10-CM | POA: Diagnosis not present

## 2014-10-18 DIAGNOSIS — R809 Proteinuria, unspecified: Secondary | ICD-10-CM | POA: Diagnosis not present

## 2014-10-18 DIAGNOSIS — N183 Chronic kidney disease, stage 3 (moderate): Secondary | ICD-10-CM | POA: Diagnosis not present

## 2014-10-18 DIAGNOSIS — E559 Vitamin D deficiency, unspecified: Secondary | ICD-10-CM | POA: Diagnosis not present

## 2014-10-18 DIAGNOSIS — D649 Anemia, unspecified: Secondary | ICD-10-CM | POA: Diagnosis not present

## 2014-10-23 DIAGNOSIS — E210 Primary hyperparathyroidism: Secondary | ICD-10-CM | POA: Diagnosis not present

## 2014-10-23 DIAGNOSIS — N200 Calculus of kidney: Secondary | ICD-10-CM | POA: Diagnosis not present

## 2014-10-23 DIAGNOSIS — D649 Anemia, unspecified: Secondary | ICD-10-CM | POA: Diagnosis not present

## 2014-10-23 DIAGNOSIS — N183 Chronic kidney disease, stage 3 (moderate): Secondary | ICD-10-CM | POA: Diagnosis not present

## 2014-10-29 DIAGNOSIS — M1990 Unspecified osteoarthritis, unspecified site: Secondary | ICD-10-CM | POA: Diagnosis not present

## 2014-10-29 DIAGNOSIS — M545 Low back pain: Secondary | ICD-10-CM | POA: Diagnosis not present

## 2014-10-29 DIAGNOSIS — I10 Essential (primary) hypertension: Secondary | ICD-10-CM | POA: Diagnosis not present

## 2014-10-29 DIAGNOSIS — Z6830 Body mass index (BMI) 30.0-30.9, adult: Secondary | ICD-10-CM | POA: Diagnosis not present

## 2014-11-06 DIAGNOSIS — H4011X1 Primary open-angle glaucoma, mild stage: Secondary | ICD-10-CM | POA: Diagnosis not present

## 2014-11-13 DIAGNOSIS — H4011X1 Primary open-angle glaucoma, mild stage: Secondary | ICD-10-CM | POA: Diagnosis not present

## 2014-11-29 DIAGNOSIS — H4011X1 Primary open-angle glaucoma, mild stage: Secondary | ICD-10-CM | POA: Diagnosis not present

## 2014-12-04 DIAGNOSIS — Z6832 Body mass index (BMI) 32.0-32.9, adult: Secondary | ICD-10-CM | POA: Diagnosis not present

## 2014-12-04 DIAGNOSIS — E119 Type 2 diabetes mellitus without complications: Secondary | ICD-10-CM | POA: Diagnosis not present

## 2014-12-04 DIAGNOSIS — Z0001 Encounter for general adult medical examination with abnormal findings: Secondary | ICD-10-CM | POA: Diagnosis not present

## 2014-12-04 DIAGNOSIS — E6609 Other obesity due to excess calories: Secondary | ICD-10-CM | POA: Diagnosis not present

## 2014-12-04 DIAGNOSIS — I10 Essential (primary) hypertension: Secondary | ICD-10-CM | POA: Diagnosis not present

## 2014-12-25 DIAGNOSIS — Z7901 Long term (current) use of anticoagulants: Secondary | ICD-10-CM | POA: Diagnosis not present

## 2014-12-25 DIAGNOSIS — E119 Type 2 diabetes mellitus without complications: Secondary | ICD-10-CM | POA: Diagnosis not present

## 2014-12-25 DIAGNOSIS — E790 Hyperuricemia without signs of inflammatory arthritis and tophaceous disease: Secondary | ICD-10-CM | POA: Diagnosis not present

## 2014-12-25 DIAGNOSIS — Z1389 Encounter for screening for other disorder: Secondary | ICD-10-CM | POA: Diagnosis not present

## 2014-12-25 DIAGNOSIS — Z6830 Body mass index (BMI) 30.0-30.9, adult: Secondary | ICD-10-CM | POA: Diagnosis not present

## 2014-12-28 DIAGNOSIS — E119 Type 2 diabetes mellitus without complications: Secondary | ICD-10-CM | POA: Diagnosis not present

## 2015-01-17 ENCOUNTER — Encounter: Payer: Self-pay | Admitting: *Deleted

## 2015-01-23 ENCOUNTER — Encounter: Payer: Self-pay | Admitting: Cardiovascular Disease

## 2015-01-29 DIAGNOSIS — Z7901 Long term (current) use of anticoagulants: Secondary | ICD-10-CM | POA: Diagnosis not present

## 2015-01-30 DIAGNOSIS — H4011X4 Primary open-angle glaucoma, indeterminate stage: Secondary | ICD-10-CM | POA: Diagnosis not present

## 2015-02-26 DIAGNOSIS — Z7901 Long term (current) use of anticoagulants: Secondary | ICD-10-CM | POA: Diagnosis not present

## 2015-02-27 DIAGNOSIS — H4011X4 Primary open-angle glaucoma, indeterminate stage: Secondary | ICD-10-CM | POA: Diagnosis not present

## 2015-03-12 DIAGNOSIS — Z7901 Long term (current) use of anticoagulants: Secondary | ICD-10-CM | POA: Diagnosis not present

## 2015-03-12 LAB — PROTIME-INR: INR: 2.9 — AB (ref 0.9–1.1)

## 2015-03-29 DIAGNOSIS — E119 Type 2 diabetes mellitus without complications: Secondary | ICD-10-CM | POA: Diagnosis not present

## 2015-04-02 DIAGNOSIS — Z79899 Other long term (current) drug therapy: Secondary | ICD-10-CM | POA: Diagnosis not present

## 2015-04-02 DIAGNOSIS — R809 Proteinuria, unspecified: Secondary | ICD-10-CM | POA: Diagnosis not present

## 2015-04-02 DIAGNOSIS — N183 Chronic kidney disease, stage 3 (moderate): Secondary | ICD-10-CM | POA: Diagnosis not present

## 2015-04-02 DIAGNOSIS — I10 Essential (primary) hypertension: Secondary | ICD-10-CM | POA: Diagnosis not present

## 2015-04-02 DIAGNOSIS — E559 Vitamin D deficiency, unspecified: Secondary | ICD-10-CM | POA: Diagnosis not present

## 2015-04-03 DIAGNOSIS — H4011X4 Primary open-angle glaucoma, indeterminate stage: Secondary | ICD-10-CM | POA: Diagnosis not present

## 2015-04-09 DIAGNOSIS — D649 Anemia, unspecified: Secondary | ICD-10-CM | POA: Diagnosis not present

## 2015-04-09 DIAGNOSIS — N183 Chronic kidney disease, stage 3 (moderate): Secondary | ICD-10-CM | POA: Diagnosis not present

## 2015-04-09 DIAGNOSIS — R809 Proteinuria, unspecified: Secondary | ICD-10-CM | POA: Diagnosis not present

## 2015-04-09 DIAGNOSIS — I10 Essential (primary) hypertension: Secondary | ICD-10-CM | POA: Diagnosis not present

## 2015-04-15 NOTE — Progress Notes (Signed)
Cardiology Office Note   Date:  04/18/2015   ID:  ZADIE DEEMER, DOB Nov 01, 1940, MRN [id number]  PCP:  Purvis Kilts, MD  Cardiologist:   Sharol Harness, MD   Chief Complaint  Patient presents with  . New Evaluation    pain in right breast sometimes, been happening about 6 months  . Dizziness    has it often/ takes antivert for it//sometimes feels like she is going to tip over  . Edema    right legs swells worse than the left//some cramping//sharp pain in right knee      History of Present Illness: [name] is a 74 y.o. female with hypertension, hyperlipidemia, prior DVT on warfarin, and diabetes type 2 who presents to establish care.  [name] was previously a patient of Dr. Sallyanne Kuster but has not been seen lately.  She neglected her health because she was caring for husband who was on hemodialysis for 14 months.  He recently had a kidney transplant earlier this year.    [name] has been feeling "so-so."   She has been following up with her Nephrologist who recommended that she see a cardiologist.  He recommended that her losartan be cut in half due to her renal function.  [name] notes occasional chest pressure that improves with belching.  Sometimes it does not go completely away.  The episodes occur sporadically and are not always associated with exertion.  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  She tries to park in the farthest parking spot and does a lot of walking while she is there. She denies any CP or SOB with ambulation.  She notices pain in her legs when laying down.  She also reports R calf pain and at times her foot feels very cold.    She also reports LE edema that improves with elevation.  She denies orthopnea or PND  She reports frequent dizziness that she improves with antivert. She had one episode of severe vertigo that didn't improve with antivert.   Past Medical History  Diagnosis Date  .  Hypertension   . Diabetes mellitus     x 5 yrs  . Hypothyroidism   . DVT of axillary vein, acute left 07/24/12  . Back pain   . Vertigo     chonic  . Mixed hyperlipidemia   . GERD (gastroesophageal reflux disease)   . Glaucoma   . Sigmoid diverticulitis   . Asthmatic bronchitis   . Pinched nerve     right elbow  . Chronic kidney disease     kidney function low  . Antral gastritis     EGD 11/15  . Kidney stones   . Peripheral venous insufficiency     Past Surgical History  Procedure Laterality Date  . Other surgical history      colostomy, colostomy reversal, for diverticulitis surgical hernia repair, arm surgery, neck surgery  . Abdominal hysterectomy    . Fracture left foot    . Eye surgery    . Back surgery      spinal   . Cholecystectomy    . Colon surgery    . Hernia repair    . Colostomy closure    . Colonoscopy N/A 12/27/2013    Procedure: COLONOSCOPY;  Surgeon: Rogene Houston, MD;  Location: [facility];  Service: Endoscopy;  Laterality: N/A;  200  . Esophagogastroduodenoscopy N/A 05/07/2014    Procedure: ESOPHAGOGASTRODUODENOSCOPY (  EGD);  Surgeon: Rogene Houston, MD;  Location: [facility];  Service: Endoscopy;  Laterality: N/A;  . US echocardiography  02/11/2010    Mild MR,trace TR & AI  . Nm myocar perf wall motion  01/28/2009    Normal     Current Outpatient Prescriptions  Medication Sig Dispense Refill  . allopurinol (ZYLOPRIM) 100 MG tablet Take 100 mg by mouth daily.    [name] amitriptyline (ELAVIL) 25 MG tablet Take 25 mg by mouth at bedtime.    . Black Cohosh 40 MG CAPS Take 40 mg by mouth 2 (two) times daily.    . cetirizine (ZYRTEC) 10 MG tablet Take 10 mg by mouth daily.    [name] esomeprazole (NEXIUM) 20 MG capsule Take 20 mg by mouth daily at 12 noon.    . furosemide (LASIX) 40 MG tablet Take 40 mg by mouth 2 (two) times daily.    [name] latanoprost (XALATAN) 0.005 % ophthalmic solution 1 drop at bedtime.    [name] losartan (COZAAR) 50 MG tablet Take 25 mg  by mouth daily.    . meclizine (ANTIVERT) 25 MG tablet Take 25 mg by mouth as needed for dizziness.     . Omega-3 Fatty Acids (FISH OIL) 1200 MG CAPS Take 1 capsule by mouth 2 (two) times daily.    . polyethylene glycol (MIRALAX / GLYCOLAX) packet Take 17 g by mouth daily.    . potassium chloride SA (K-DUR,KLOR-CON) 20 MEQ tablet Take 20 mEq by mouth daily.    . pravastatin (PRAVACHOL) 10 MG tablet Take 10 mg by mouth daily.  5  . topiramate (TOPAMAX) 25 MG tablet Take 25 mg by mouth daily.     [name] warfarin (COUMADIN) 1 MG tablet Take 1 mg by mouth daily.  11   No current facility-administered medications for this visit.    Allergies:   Vioxx; Penicillins; Codeine; Motrin; and Sulfur    Social History:  The patient  reports that she quit smoking about 16 months ago. Her smoking use included Cigarettes. She has never used smokeless tobacco. She reports that she does not drink alcohol or use illicit drugs.   Family History:  The patient's family history includes CVA (age of onset: 78) in her maternal grandmother; Cancer in her mother; Cancer (age of onset: 27) in her sister; Heart attack (age of onset: 87) in her brother; Heart disease in her maternal grandmother; Other (age of onset: 60) in her mother; Stroke in her maternal grandmother.    ROS:  Please see the history of present illness.   Otherwise, review of systems are positive for kidney stone, easy bleeding.   All other systems are reviewed and negative.    PHYSICAL EXAM: VS:  BP 152/94 mmHg  Pulse 89  Ht 5' 2.5" (1.588 m)  Wt 78.064 kg (172 lb 1.6 oz)  BMI 30.96 kg/m2 , BMI Body mass index is 30.96 kg/(m^2). GENERAL:  Well appearing HEENT:  Pupils equal round and reactive, fundi not visualized, oral mucosa unremarkable NECK:  No jugular venous distention, waveform within normal limits, carotid upstroke brisk and symmetric, no bruits, no thyromegaly LYMPHATICS:  No cervical adenopathy LUNGS:  Clear to auscultation  bilaterally HEART:  RRR.  PMI not displaced or sustained,S1 and S2 within normal limits, no S3, no S4, no clicks, no rubs, no murmurs ABD:  Flat, positive bowel sounds normal in frequency in pitch, no bruits, no rebound, no guarding, no midline pulsatile mass, no hepatomegaly, no splenomegaly EXT:  2  plus radial pulses, 1+ DP/PT bilaterally, no edema, no cyanosis no clubbing SKIN:  No rashes no nodules.  Erythema of bilateral anterior tibia.  Not warm to touch.   NEURO:  Cranial nerves II through XII grossly intact, motor grossly intact throughout PSYCH:  Cognitively intact, oriented to person place and time   EKG:  EKG is ordered today. The ekg ordered today demonstrates sinus rhythm at 89 bpm.  RBBB.   Recent Labs: 05/07/2014: Hemoglobin 10.2*; Platelets 244 04/17/2015: ALT 23; BNP 44.3; BUN 12; Creatinine, Ser 1.07*; Potassium 4.5; Sodium 146*; TSH 1.380   03/12/15: INR 2.9   Lipid Panel    Component Value Date/Time   CHOL 237* 04/17/2015 1010   TRIG 166* 04/17/2015 1010   HDL 43 04/17/2015 1010   LDLCALC 161* 04/17/2015 1010      Wt Readings from Last 3 Encounters:  04/16/15 78.064 kg (172 lb 1.6 oz)  08/15/14 74.844 kg (165 lb)  07/25/14 75.07 kg (165 lb 8 oz)      ASSESSMENT AND PLAN:  # Atypical chest pain: Symptoms are atypical.  However, she has several risk factors.  Therefore we will refer her for exercise Cardiolite.  We will also check a CBC, Chem 7, and thyroid function. - Exercise cardiolyte - CBC, Chem 7, TSH, fT4  # LE edema: [name] reports both shortness of breath and lower extremity edema, though she does not appear volume overloaded today.  We will order an echo and check her thyroid function today. - Echo - Thyroid function  # Hypertension: BP was above goal today.  She states that it was 90/60 at home.  She will keep a BP log and call in 2 weeks if readings are >140/90.  For now, continue Losartan 25mg  daily.  She had worsened renal function at  higher doses.  # Claudication: [name] reports leg pain with ambulation and at rest.  Her distal pulses are also diminished. We will obtain ABI and peripheral dopplers to evaluate for PAD.  # Hyperlipidemia: [name] is taking pravastatin.  She has not had any lipids checked recently.  Will check lipids in order to adjust the dose.   Current medicines are reviewed at length with the patient today.  The patient does not have concerns regarding medicines.  The following changes have been made:  no change  Labs/ tests ordered today include:   Orders Placed This Encounter  Procedures  . CBC  . Brain natriuretic peptide  . Comprehensive metabolic panel  . Lipid panel  . T4, free  . TSH  . Myocardial Perfusion Imaging  . EKG 12-Lead  . ECHOCARDIOGRAM COMPLETE     Disposition:   FU with Darlene Boughner C. Oval Linsey, MD in 3 months    Signed, Sharol Harness, MD  04/18/2015 3:50 PM    [place]

## 2015-04-16 ENCOUNTER — Ambulatory Visit (INDEPENDENT_AMBULATORY_CARE_PROVIDER_SITE_OTHER): Payer: Medicare Other | Admitting: Cardiovascular Disease

## 2015-04-16 VITALS — BP 152/94 | HR 89 | Ht 62.5 in | Wt 172.1 lb

## 2015-04-16 DIAGNOSIS — I739 Peripheral vascular disease, unspecified: Secondary | ICD-10-CM

## 2015-04-16 DIAGNOSIS — E785 Hyperlipidemia, unspecified: Secondary | ICD-10-CM

## 2015-04-16 DIAGNOSIS — R0602 Shortness of breath: Secondary | ICD-10-CM

## 2015-04-16 DIAGNOSIS — R072 Precordial pain: Secondary | ICD-10-CM

## 2015-04-16 DIAGNOSIS — Z79899 Other long term (current) drug therapy: Secondary | ICD-10-CM

## 2015-04-16 NOTE — Patient Instructions (Signed)
Your physician recommends that you return for lab work in: [facility].  YOU DO NOT NEED AN APPTOINTMENT.  Your physician has requested that you have an echocardiogram. Echocardiography is a painless test that uses sound waves to create images of your heart. It provides your doctor with information about the size and shape of your heart and how well your heart's chambers and valves are working. This procedure takes approximately one hour. There are no restrictions for this procedure.  Your physician has requested that you have en exercise stress myoview. For further information please visit [email or web address]. Please follow instruction sheet, as given.  Your physician has requested that you have an ankle brachial index (ABI). During this test an ultrasound and blood pressure cuff are used to evaluate the arteries that supply the arms and legs with blood. Allow thirty minutes for this exam. There are no restrictions or special instructions.  Your physician recommends that you schedule a follow-up appointment in: 3 MONTHS WITH DR. [place].

## 2015-04-17 ENCOUNTER — Other Ambulatory Visit: Payer: Self-pay | Admitting: Cardiovascular Disease

## 2015-04-17 DIAGNOSIS — Z7901 Long term (current) use of anticoagulants: Secondary | ICD-10-CM | POA: Diagnosis not present

## 2015-04-17 DIAGNOSIS — R0602 Shortness of breath: Secondary | ICD-10-CM | POA: Diagnosis not present

## 2015-04-17 DIAGNOSIS — E785 Hyperlipidemia, unspecified: Secondary | ICD-10-CM | POA: Diagnosis not present

## 2015-04-17 DIAGNOSIS — Z79899 Other long term (current) drug therapy: Secondary | ICD-10-CM | POA: Diagnosis not present

## 2015-04-18 LAB — CBC WITH DIFFERENTIAL/PLATELET
BASOS ABS: 0 10*3/uL (ref 0.0–0.2)
Basos: 1 %
EOS (ABSOLUTE): 0.1 10*3/uL (ref 0.0–0.4)
Eos: 3 %
Hematocrit: 39.6 % (ref 34.0–46.6)
Hemoglobin: 13.1 g/dL (ref 11.1–15.9)
Immature Grans (Abs): 0 10*3/uL (ref 0.0–0.1)
Immature Granulocytes: 0 %
LYMPHS ABS: 2.3 10*3/uL (ref 0.7–3.1)
Lymphs: 44 %
MCH: 28.9 pg (ref 26.6–33.0)
MCHC: 33.1 g/dL (ref 31.5–35.7)
MCV: 87 fL (ref 79–97)
MONOCYTES: 6 %
MONOS ABS: 0.3 10*3/uL (ref 0.1–0.9)
Neutrophils Absolute: 2.5 10*3/uL (ref 1.4–7.0)
Neutrophils: 46 %
PLATELETS: 255 10*3/uL (ref 150–379)
RBC: 4.54 x10E6/uL (ref 3.77–5.28)
RDW: 14.3 % (ref 12.3–15.4)
WBC: 5.2 10*3/uL (ref 3.4–10.8)

## 2015-04-18 LAB — BRAIN NATRIURETIC PEPTIDE: BNP: 44.3 pg/mL (ref 0.0–100.0)

## 2015-04-18 LAB — COMPREHENSIVE METABOLIC PANEL
ALK PHOS: 113 IU/L (ref 39–117)
ALT: 23 IU/L (ref 0–32)
AST: 18 IU/L (ref 0–40)
Albumin/Globulin Ratio: 1.8 (ref 1.1–2.5)
Albumin: 4.4 g/dL (ref 3.5–4.8)
BUN/Creatinine Ratio: 11 (ref 11–26)
BUN: 12 mg/dL (ref 8–27)
Bilirubin Total: 0.2 mg/dL (ref 0.0–1.2)
CO2: 25 mmol/L (ref 18–29)
CREATININE: 1.07 mg/dL — AB (ref 0.57–1.00)
Calcium: 9.8 mg/dL (ref 8.7–10.3)
Chloride: 106 mmol/L (ref 97–108)
GFR calc Af Amer: 59 mL/min/{1.73_m2} — ABNORMAL LOW (ref >59)
GFR calc non Af Amer: 51 mL/min/{1.73_m2} — ABNORMAL LOW (ref >59)
GLUCOSE: 124 mg/dL — AB (ref 65–99)
Globulin, Total: 2.4 g/dL (ref 1.5–4.5)
Potassium: 4.5 mmol/L (ref 3.5–5.2)
Sodium: 146 mmol/L — ABNORMAL HIGH (ref 134–144)
Total Protein: 6.8 g/dL (ref 6.0–8.5)

## 2015-04-18 LAB — LIPID PANEL W/O CHOL/HDL RATIO
CHOLESTEROL TOTAL: 237 mg/dL — AB (ref 100–199)
HDL: 43 mg/dL (ref >39)
LDL CALC: 161 mg/dL — AB (ref 0–99)
Triglycerides: 166 mg/dL — ABNORMAL HIGH (ref 0–149)
VLDL CHOLESTEROL CAL: 33 mg/dL (ref 5–40)

## 2015-04-18 LAB — TSH+FREE T4
FREE T4: 1.02 ng/dL (ref 0.82–1.77)
TSH: 1.38 u[IU]/mL (ref 0.450–4.500)

## 2015-04-19 ENCOUNTER — Encounter: Payer: Self-pay | Admitting: Cardiovascular Disease

## 2015-04-23 ENCOUNTER — Telehealth: Payer: Self-pay | Admitting: *Deleted

## 2015-04-23 DIAGNOSIS — Z79899 Other long term (current) drug therapy: Secondary | ICD-10-CM

## 2015-04-23 DIAGNOSIS — E785 Hyperlipidemia, unspecified: Secondary | ICD-10-CM

## 2015-04-23 MED ORDER — PRAVASTATIN SODIUM 40 MG PO TABS: 40.0000 mg | ORAL_TABLET | Freq: Every evening | ORAL | Status: DC

## 2015-04-23 NOTE — Telephone Encounter (Signed)
Spoke to patient. Result given . Verbalized understanding E-sent medication pravastatin 40 mg - 90 tabs - x 3  Will mail labslip in 3 months patient aware

## 2015-04-23 NOTE — Telephone Encounter (Signed)
-----   Message from Skeet Latch, MD sent at 04/22/2015  5:29 PM EDT ----- Kidney function mildly abnormal but improved from prior.  Blood count is normal.  Lipids are elevated.  Increase pravastatin to 40 mg and recheck lipids and LFTs in 3 months.

## 2015-04-24 DIAGNOSIS — M1990 Unspecified osteoarthritis, unspecified site: Secondary | ICD-10-CM | POA: Diagnosis not present

## 2015-04-24 DIAGNOSIS — M545 Low back pain: Secondary | ICD-10-CM | POA: Diagnosis not present

## 2015-04-25 ENCOUNTER — Telehealth (HOSPITAL_COMMUNITY): Payer: Self-pay

## 2015-04-25 ENCOUNTER — Other Ambulatory Visit: Payer: Self-pay

## 2015-04-25 ENCOUNTER — Ambulatory Visit (HOSPITAL_COMMUNITY): Payer: Medicare Other | Attending: Cardiovascular Disease

## 2015-04-25 DIAGNOSIS — E119 Type 2 diabetes mellitus without complications: Secondary | ICD-10-CM | POA: Insufficient documentation

## 2015-04-25 DIAGNOSIS — Z87891 Personal history of nicotine dependence: Secondary | ICD-10-CM | POA: Insufficient documentation

## 2015-04-25 DIAGNOSIS — I340 Nonrheumatic mitral (valve) insufficiency: Secondary | ICD-10-CM | POA: Insufficient documentation

## 2015-04-25 DIAGNOSIS — R072 Precordial pain: Secondary | ICD-10-CM

## 2015-04-25 DIAGNOSIS — R079 Chest pain, unspecified: Secondary | ICD-10-CM | POA: Diagnosis not present

## 2015-04-25 DIAGNOSIS — I10 Essential (primary) hypertension: Secondary | ICD-10-CM | POA: Diagnosis not present

## 2015-04-25 NOTE — Telephone Encounter (Signed)
Encounter complete. 

## 2015-04-29 ENCOUNTER — Telehealth: Payer: Self-pay | Admitting: *Deleted

## 2015-04-29 NOTE — Telephone Encounter (Signed)
Spoke to patient Patient states her blood pressure has been 120/76 ,110/64. Since last visit.  RN spoke to Dr Oval Linsey . Okay with blood range at present. Continue with current treatment.no need follow up b/p check If blood pressure consistently goes above 140/90 contact office. Patient verbalized understanding.

## 2015-04-29 NOTE — Telephone Encounter (Signed)
-----   Message from Skeet Latch, MD sent at 04/25/2015  5:49 PM EDT ----- Echo shows her heart is stiff and does not relax well.  Ask what her blood pressure log shows.  If BP is >140/90, start metoprolol succinate 25 mg daily.  Follow up in 1 months for BP check.

## 2015-04-29 NOTE — Telephone Encounter (Signed)
LEFT MESSAGE WITH HUSBAND TO CALL BACK 

## 2015-04-30 ENCOUNTER — Ambulatory Visit (HOSPITAL_BASED_OUTPATIENT_CLINIC_OR_DEPARTMENT_OTHER)
Admission: RE | Admit: 2015-04-30 | Discharge: 2015-04-30 | Disposition: A | Discharge status: Home / Self Care | Payer: Medicare Other | Source: Ambulatory Visit | Attending: Cardiovascular Disease | Admitting: Cardiovascular Disease

## 2015-04-30 ENCOUNTER — Ambulatory Visit (HOSPITAL_COMMUNITY)
Admission: RE | Admit: 2015-04-30 | Discharge: 2015-04-30 | Disposition: A | Discharge status: Home / Self Care | Payer: Medicare Other | Source: Ambulatory Visit | Attending: Cardiology | Admitting: Cardiology

## 2015-04-30 DIAGNOSIS — R5383 Other fatigue: Secondary | ICD-10-CM | POA: Insufficient documentation

## 2015-04-30 DIAGNOSIS — R072 Precordial pain: Secondary | ICD-10-CM | POA: Diagnosis not present

## 2015-04-30 DIAGNOSIS — R0602 Shortness of breath: Secondary | ICD-10-CM | POA: Diagnosis not present

## 2015-04-30 DIAGNOSIS — R42 Dizziness and giddiness: Secondary | ICD-10-CM | POA: Insufficient documentation

## 2015-04-30 DIAGNOSIS — E119 Type 2 diabetes mellitus without complications: Secondary | ICD-10-CM | POA: Diagnosis not present

## 2015-04-30 DIAGNOSIS — R9439 Abnormal result of other cardiovascular function study: Secondary | ICD-10-CM | POA: Diagnosis not present

## 2015-04-30 DIAGNOSIS — N189 Chronic kidney disease, unspecified: Secondary | ICD-10-CM | POA: Diagnosis not present

## 2015-04-30 DIAGNOSIS — I129 Hypertensive chronic kidney disease with stage 1 through stage 4 chronic kidney disease, or unspecified chronic kidney disease: Secondary | ICD-10-CM | POA: Insufficient documentation

## 2015-04-30 DIAGNOSIS — I739 Peripheral vascular disease, unspecified: Secondary | ICD-10-CM | POA: Diagnosis not present

## 2015-04-30 LAB — MYOCARDIAL PERFUSION IMAGING
CHL CUP MPHR: 146 {beats}/min
CHL RATE OF PERCEIVED EXERTION: 17
CSEPEW: 7 METS
Exercise duration (min): 5 min
LVDIAVOL: 47 mL
LVSYSVOL: 11 mL
Peak HR: 151 {beats}/min
Percent HR: 103 %
Rest HR: 68 {beats}/min
SDS: 4
SRS: 0
SSS: 4
TID: 0.83

## 2015-04-30 MED ORDER — TECHNETIUM TC 99M SESTAMIBI GENERIC - CARDIOLITE
10.5000 | Freq: Once | INTRAVENOUS | Status: AC | PRN
  Administered 2015-04-30: 10.5 via INTRAVENOUS

## 2015-04-30 MED ORDER — TECHNETIUM TC 99M SESTAMIBI GENERIC - CARDIOLITE
30.8000 | Freq: Once | INTRAVENOUS | Status: AC | PRN
  Administered 2015-04-30: 30.8 via INTRAVENOUS

## 2015-05-02 ENCOUNTER — Telehealth: Payer: Self-pay | Admitting: *Deleted

## 2015-05-02 NOTE — Telephone Encounter (Signed)
Spoke to patient. Result given . Verbalized understanding Appointment schedule 05/20/15 9am

## 2015-05-02 NOTE — Telephone Encounter (Signed)
-----   Message from Skeet Latch, MD sent at 05/01/2015  8:17 AM EDT ----- Stress test was mildly abnormal.  Most likely the abnormality seen in blood flow was due to an artifact, but the only way to tell for sure is with a heart catheterization.  Please schedule follow up in the next couple of weeks to discuss the options.

## 2015-05-12 DIAGNOSIS — D34 Benign neoplasm of thyroid gland: Secondary | ICD-10-CM | POA: Diagnosis not present

## 2015-05-12 DIAGNOSIS — E042 Nontoxic multinodular goiter: Secondary | ICD-10-CM | POA: Diagnosis not present

## 2015-05-19 DIAGNOSIS — I10 Essential (primary) hypertension: Secondary | ICD-10-CM | POA: Diagnosis not present

## 2015-05-19 DIAGNOSIS — D34 Benign neoplasm of thyroid gland: Secondary | ICD-10-CM | POA: Diagnosis not present

## 2015-05-19 DIAGNOSIS — E042 Nontoxic multinodular goiter: Secondary | ICD-10-CM | POA: Diagnosis not present

## 2015-05-19 NOTE — Progress Notes (Signed)
Cardiology Office Note   Date:  05/20/2015   ID:  HELLON LUAN, DOB 1941-04-03, MRN [id number]  PCP:  Purvis Kilts, MD  Cardiologist:   Sharol Harness, MD   Chief Complaint  Patient presents with  . Follow-up    stress test results pt states no chest pain no SOB no some dizziness but pt states she has medication for it  . Edema    both feet but the right is worse      Patient ID: [name] is a 74 y.o. female with hypertension, hyperlipidemia, prior DVT on warfarin, and diabetes type 2 who presents to establish care.    Interval history 05/20/15:  [name] had an exercise nuclear stress test that was equivocal. It was likely negative with breast attenuation artifact. It did show that her blood pressure was poorly controlled, as she had a hypertensive response to exercise with systolic blood pressure up to 209 mmHg. She denies any chest pain.  She does have pain in her right breast.  This can occur both with exertion at with rest.  It is not associated with shortness of breath, nausea, vomiting, lightheadedness, or diaphoresis.  She notices it every time she tries to sweep a floor.  She is unable to do more vigorous activity due to fatigue that has been present ever since she had rheumatic fever at age 57. At her last appointment [name] increased her pravastatin to 40 mg from 10 mg because her lipids were not at goal.  After taking this dose for several days she noted severe body aches and chills. She felt like she had the flu. She decreased the dose back to 10 mg and felt okay. She has noted some lower extremity edema but she thinks this is because she's not taking her diuretics in the last 2 days. She recently went on a car trip and was afraid of taking it because she would not have easy access to the bathroom. She denies orthopnea or PND.   History of Present Illness 04/16/15:  [name] was previously a patient of Dr. Sallyanne Kuster but has not been seen  lately.  She neglected her health because she was caring for husband who was on hemodialysis for 14 months.  He recently had a kidney transplant earlier this year.    [name] has been feeling "so-so."   She has been following up with her Nephrologist who recommended that she see a cardiologist.  He recommended that her losartan be cut in half due to her renal function.  [name] notes occasional chest pressure that improves with belching.  Sometimes it does not go completely away.  The episodes occur sporadically and are not always associated with exertion.  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  She tries to park in the farthest parking spot and does a lot of walking while she is there. She denies any CP or SOB with ambulation.  She notices pain in her legs when laying down.  She also reports R calf pain and at times her foot feels very cold.    She also reports LE edema that improves with elevation.  She denies orthopnea or PND  She reports frequent dizziness that she improves with antivert. She had one episode of severe vertigo that didn't improve with antivert.   Past Medical History  Diagnosis Date  . Hypertension   . Diabetes mellitus  x 5 yrs  . Hypothyroidism   . DVT of axillary vein, acute left 07/24/12  . Back pain   . Vertigo     chonic  . Mixed hyperlipidemia   . GERD (gastroesophageal reflux disease)   . Glaucoma   . Sigmoid diverticulitis   . Asthmatic bronchitis   . Pinched nerve     right elbow  . Chronic kidney disease     kidney function low  . Antral gastritis     EGD 11/15  . Kidney stones   . Peripheral venous insufficiency   . Chronic diastolic heart failure (HCC) 05/20/2015    Grade 2 diastolic dysfunction.  04/2015.  [name] Hyperlipidemia 05/20/2015    Past Surgical History  Procedure Laterality Date  . Other surgical history      colostomy, colostomy reversal, for diverticulitis surgical hernia repair, arm  surgery, neck surgery  . Abdominal hysterectomy    . Fracture left foot    . Eye surgery    . Back surgery      spinal   . Cholecystectomy    . Colon surgery    . Hernia repair    . Colostomy closure    . Colonoscopy N/A 12/27/2013    Procedure: COLONOSCOPY;  Surgeon: Rogene Houston, MD;  Location: [facility];  Service: Endoscopy;  Laterality: N/A;  200  . Esophagogastroduodenoscopy N/A 05/07/2014    Procedure: ESOPHAGOGASTRODUODENOSCOPY (EGD);  Surgeon: Rogene Houston, MD;  Location: [facility];  Service: Endoscopy;  Laterality: N/A;  . US echocardiography  02/11/2010    Mild MR,trace TR & AI  . Nm myocar perf wall motion  01/28/2009    Normal     Current Outpatient Prescriptions  Medication Sig Dispense Refill  . allopurinol (ZYLOPRIM) 100 MG tablet Take 100 mg by mouth daily.    [name] amitriptyline (ELAVIL) 25 MG tablet Take 25 mg by mouth at bedtime.    . Black Cohosh 40 MG CAPS Take 40 mg by mouth 2 (two) times daily.    . cetirizine (ZYRTEC) 10 MG tablet Take 10 mg by mouth daily.    [name] esomeprazole (NEXIUM) 20 MG capsule Take 20 mg by mouth daily at 12 noon.    . furosemide (LASIX) 40 MG tablet Take 40 mg by mouth 2 (two) times daily.    [name] latanoprost (XALATAN) 0.005 % ophthalmic solution 1 drop at bedtime.    [name] losartan (COZAAR) 50 MG tablet Take 25 mg by mouth daily.    . meclizine (ANTIVERT) 25 MG tablet Take 25 mg by mouth as needed for dizziness.     . Omega-3 Fatty Acids (FISH OIL) 1200 MG CAPS Take 1 capsule by mouth 2 (two) times daily.    . polyethylene glycol (MIRALAX / GLYCOLAX) packet Take 17 g by mouth daily.    . potassium chloride SA (K-DUR,KLOR-CON) 20 MEQ tablet Take 20 mEq by mouth daily.    . pravastatin (PRAVACHOL) 10 MG tablet Take 1 tablet (10 mg total) by mouth 2 (two) times daily. 180 tablet 3  . topiramate (TOPAMAX) 25 MG tablet Take 25 mg by mouth daily.     . traMADol (ULTRAM) 50 MG tablet Take 50 mg by mouth every 8 (eight) hours as needed. for  pain  2  . warfarin (COUMADIN) 1 MG tablet Take 1 mg by mouth daily.  11  . carvedilol (COREG) 6.25 MG tablet Take 1 tablet (6.25 mg total) by mouth 2 (two) times daily. 180 tablet  3   No current facility-administered medications for this visit.    Allergies:   Vioxx; Penicillins; Codeine; Motrin; and Sulfur    Social History:  The patient  reports that she quit smoking about 17 months ago. Her smoking use included Cigarettes. She has never used smokeless tobacco. She reports that she does not drink alcohol or use illicit drugs.   Family History:  The patient's family history includes CVA (age of onset: 60) in her maternal grandmother; Cancer in her mother; Cancer (age of onset: 37) in her sister; Heart attack (age of onset: 39) in her brother; Heart disease in her maternal grandmother; Other (age of onset: 34) in her mother; Stroke in her maternal grandmother.    ROS:  Please see the history of present illness.   Otherwise, review of systems are positive for kidney stone, easy bleeding.   All other systems are reviewed and negative.    PHYSICAL EXAM: VS:  BP 142/78 mmHg  Pulse 86  Ht 5' 2.5" (1.588 m)  Wt 81.239 kg (179 lb 1.6 oz)  BMI 32.22 kg/m2 , BMI Body mass index is 32.22 kg/(m^2). GENERAL:  Well appearing HEENT:  Pupils equal round and reactive, fundi not visualized, oral mucosa unremarkable NECK:  No jugular venous distention, waveform within normal limits, carotid upstroke brisk and symmetric, no bruits, no thyromegaly LYMPHATICS:  No cervical adenopathy LUNGS:  Clear to auscultation bilaterally HEART:  RRR.  PMI not displaced or sustained,S1 and S2 within normal limits, no S3, no S4, no clicks, no rubs, no murmurs ABD:  Flat, positive bowel sounds normal in frequency in pitch, no bruits, no rebound, no guarding, no midline pulsatile mass, no hepatomegaly, no splenomegaly EXT:  2 plus radial pulses, 1+ DP/PT bilaterally, 1+ LE edema to the upper tibia bilaterally, no cyanosis  no clubbing SKIN:  No rashes no nodules.  Erythema of bilateral anterior tibia.  Not warm to touch.   NEURO:  Cranial nerves II through XII grossly intact, motor grossly intact throughout PSYCH:  Cognitively intact, oriented to person place and time   EKG:  EKG is ordered today. The ekg ordered today demonstrates sinus rhythm at 89 bpm.  RBBB.  Exercise Myoview 04/30/15  The left ventricular ejection fraction is hyperdynamic (>65%).  Nuclear stress EF: 78%.  Blood pressure demonstrated a hypertensive response to exercise.  Upsloping ST segment depression ST segment depression was noted during stress in the III, II and aVF leads. This is not diagnostic for ischemia.  Defect 1: There is a small defect of moderate severity present in the mid anterior and apical anterior location. Wall motion is normal, so this is likely artifact due to breast attenuation. However, cannot rule out LAD ischemia.  This is a low risk study.   Recent Labs: 04/17/2015: ALT 23; BNP 44.3; BUN 12; Creatinine, Ser 1.07*; Potassium 4.5; Sodium 146*; TSH 1.380   03/12/15: INR 2.9   Lipid Panel    Component Value Date/Time   CHOL 237* 04/17/2015 1010   TRIG 166* 04/17/2015 1010   HDL 43 04/17/2015 1010   LDLCALC 161* 04/17/2015 1010      Wt Readings from Last 3 Encounters:  05/20/15 81.239 kg (179 lb 1.6 oz)  04/30/15 78.019 kg (172 lb)  04/16/15 78.064 kg (172 lb 1.6 oz)      ASSESSMENT AND PLAN:  # Abnormal stress test/atypical chest pain: We discussed the fact that her stress test is likely normal. However I cannot guarantee that there is no anterior  ischemia. We discussed the options of continuing to manage her medically versus proceeding with cardiac catheterization for definitive diagnosis. She prefers to undergo cardiac catheterization.  # Grade 2 diastolic dysfunction: [name] has lower extremity edema that is was not present on her last exam. This is likely due to her not taking her  Lasix recently. She will resume Lasix as ordered. Her echo did show grade 2 diastolic dysfunction. We will start carvedilol 6.25 mg twice daily  , as she will likely benefit from a lower heart rate. Also, her blood pressure was not well-controlled during her stress test and has been elevated in clinic. She is now amenable to starting an additional medication.  # Hypertension: BP  remains above goal today. Continue Losartan 25mg  daily.  She had worsened renal function at higher doses.  Start carvedilol 6.25 mg twice daily.   # Leg pain : [name] reports leg pain with ambulation and at rest.   ABIs were normal bilaterally.   # Hyperlipidemia: pravastatin was increased to 40 mg due to lipids not being at goal. She was unable to tolerate this dose and has started back taking 10 mg. She will now take 20 mg and see if she can tolerate that dose. If not we will have to decrease back to 10 mg. She has not tolerated rosuvastatin in the past.    Current medicines are reviewed at length with the patient today.  The patient does not have concerns regarding medicines.  The following changes have been made:  no change  Labs/ tests ordered today include:   Orders Placed This Encounter  Procedures  . DG Chest 2 View  . CBC  . Basic metabolic panel  . Protime-INR  . APTT  . LEFT HEART CATHETERIZATION WITH CORONARY/GRAFT ANGIOGRAM     Disposition:   FU with Iana Buzan C. Oval Linsey, MD in 6 months    Signed, Sharol Harness, MD  05/20/2015 9:41 AM    [place]

## 2015-05-20 ENCOUNTER — Ambulatory Visit (INDEPENDENT_AMBULATORY_CARE_PROVIDER_SITE_OTHER): Payer: Medicare Other | Admitting: Cardiovascular Disease

## 2015-05-20 ENCOUNTER — Encounter: Payer: Self-pay | Admitting: Interventional Cardiology

## 2015-05-20 ENCOUNTER — Encounter: Payer: Self-pay | Admitting: Cardiovascular Disease

## 2015-05-20 ENCOUNTER — Ambulatory Visit (HOSPITAL_COMMUNITY)
Admission: RE | Admit: 2015-05-20 | Discharge: 2015-05-20 | Disposition: A | Discharge status: Home / Self Care | Payer: Medicare Other | Source: Ambulatory Visit | Attending: Cardiovascular Disease | Admitting: Cardiovascular Disease

## 2015-05-20 VITALS — BP 142/78 | HR 86 | Ht 62.5 in | Wt 179.1 lb

## 2015-05-20 DIAGNOSIS — I10 Essential (primary) hypertension: Secondary | ICD-10-CM | POA: Insufficient documentation

## 2015-05-20 DIAGNOSIS — Z01818 Encounter for other preprocedural examination: Secondary | ICD-10-CM

## 2015-05-20 DIAGNOSIS — R918 Other nonspecific abnormal finding of lung field: Secondary | ICD-10-CM | POA: Diagnosis not present

## 2015-05-20 DIAGNOSIS — R9439 Abnormal result of other cardiovascular function study: Secondary | ICD-10-CM

## 2015-05-20 DIAGNOSIS — R5383 Other fatigue: Secondary | ICD-10-CM | POA: Diagnosis not present

## 2015-05-20 DIAGNOSIS — R931 Abnormal findings on diagnostic imaging of heart and coronary circulation: Secondary | ICD-10-CM | POA: Diagnosis not present

## 2015-05-20 DIAGNOSIS — E785 Hyperlipidemia, unspecified: Secondary | ICD-10-CM

## 2015-05-20 DIAGNOSIS — Z452 Encounter for adjustment and management of vascular access device: Secondary | ICD-10-CM | POA: Diagnosis not present

## 2015-05-20 DIAGNOSIS — D689 Coagulation defect, unspecified: Secondary | ICD-10-CM

## 2015-05-20 DIAGNOSIS — I5032 Chronic diastolic (congestive) heart failure: Secondary | ICD-10-CM

## 2015-05-20 HISTORY — DX: Hyperlipidemia, unspecified: E78.5

## 2015-05-20 HISTORY — DX: Chronic diastolic (congestive) heart failure: I50.32

## 2015-05-20 MED ORDER — PRAVASTATIN SODIUM 10 MG PO TABS: 10.0000 mg | ORAL_TABLET | Freq: Two times a day (BID) | ORAL | Status: DC

## 2015-05-20 MED ORDER — CARVEDILOL 6.25 MG PO TABS: 6.2500 mg | ORAL_TABLET | Freq: Two times a day (BID) | ORAL | Status: DC

## 2015-05-20 NOTE — Patient Instructions (Signed)
Dr Oval Linsey has recommended making the following medication changes: START Carvedilol (Coreg) 6.25 mg - take 1 tablet by mouth twice daily. START taking your Pravastatin (Pravachol) 10 mg TWICE DAILY.  >>New prescriptions have been sent to your pharmacy electronically.  Your physician has requested that you have a cardiac catheterization. Cardiac catheterization is used to diagnose and/or treat various heart conditions. Doctors may recommend this procedure for a number of different reasons. The most common reason is to evaluate chest pain. Chest pain can be a symptom of coronary artery disease (CAD), and cardiac catheterization can show whether plaque is narrowing or blocking your heart's arteries. This procedure is also used to evaluate the valves, as well as measure the blood flow and oxygen levels in different parts of your heart. For further information please visit [email or web address]. Please follow instruction sheet, as given.  You will have to have some blood work and a chest x-ray done prior to your procedure. The chest x-ray order has been placed at [facility]. You may have the blood work done at any [facility] lab location.

## 2015-05-21 LAB — APTT: APTT: 39 s — AB (ref 24–33)

## 2015-05-21 LAB — PROTIME-INR
INR: 2.4 — ABNORMAL HIGH (ref 0.8–1.2)
Prothrombin Time: 24.4 s — ABNORMAL HIGH (ref 9.1–12.0)

## 2015-05-21 LAB — BASIC METABOLIC PANEL
BUN/Creatinine Ratio: 14 (ref 11–26)
BUN: 14 mg/dL (ref 8–27)
CO2: 27 mmol/L (ref 18–29)
Calcium: 9.3 mg/dL (ref 8.7–10.3)
Chloride: 106 mmol/L (ref 97–106)
Creatinine, Ser: 0.98 mg/dL (ref 0.57–1.00)
GFR calc Af Amer: 66 mL/min/{1.73_m2} (ref >59)
GFR calc non Af Amer: 57 mL/min/{1.73_m2} — ABNORMAL LOW (ref >59)
Glucose: 136 mg/dL — ABNORMAL HIGH (ref 65–99)
Potassium: 4.4 mmol/L (ref 3.5–5.2)
Sodium: 147 mmol/L — ABNORMAL HIGH (ref 136–144)

## 2015-05-21 LAB — CBC
Hematocrit: 36.7 % (ref 34.0–46.6)
Hemoglobin: 12.3 g/dL (ref 11.1–15.9)
MCH: 29.2 pg (ref 26.6–33.0)
MCHC: 33.5 g/dL (ref 31.5–35.7)
MCV: 87 fL (ref 79–97)
Platelets: 271 10*3/uL (ref 150–379)
RBC: 4.21 x10E6/uL (ref 3.77–5.28)
RDW: 14.1 % (ref 12.3–15.4)
WBC: 7 10*3/uL (ref 3.4–10.8)

## 2015-05-21 LAB — TSH: TSH: 1.3 u[IU]/mL (ref 0.450–4.500)

## 2015-05-22 ENCOUNTER — Telehealth: Payer: Self-pay | Admitting: *Deleted

## 2015-05-22 ENCOUNTER — Other Ambulatory Visit: Payer: Self-pay

## 2015-05-22 DIAGNOSIS — R9439 Abnormal result of other cardiovascular function study: Secondary | ICD-10-CM

## 2015-05-22 NOTE — Telephone Encounter (Signed)
-----   Message from Skeet Latch, MD sent at 05/21/2015  8:25 AM EST ----- Glucose is high.  Please follow up with your primary care provider.  Also, she is on warfarin and the level is therapeutic.  However, this will have to be held prior to cath.  DVT was in 2014, so it is OK to stop warfarin.  Please check with the interventionalist for how long they want warfarin to be held.

## 2015-05-22 NOTE — Telephone Encounter (Signed)
SPOKE WITH HUSBAND , INFORMED HIM TO HAVE PATIENT TO HOLD WARFARIN AFTER TODAY  HUSBAND STATES SHE TOOK LAST DOSE LAST NIGHT.

## 2015-05-26 ENCOUNTER — Ambulatory Visit (HOSPITAL_COMMUNITY)
Admission: RE | Admit: 2015-05-26 | Discharge: 2015-05-26 | Disposition: A | Discharge status: Home / Self Care | Payer: Medicare Other | Source: Ambulatory Visit | Attending: Interventional Cardiology | Admitting: Interventional Cardiology

## 2015-05-26 ENCOUNTER — Encounter (HOSPITAL_COMMUNITY)
Admission: RE | Disposition: A | Discharge status: Home / Self Care | Payer: Self-pay | Source: Ambulatory Visit | Attending: Interventional Cardiology

## 2015-05-26 ENCOUNTER — Encounter (HOSPITAL_COMMUNITY): Payer: Self-pay | Admitting: Interventional Cardiology

## 2015-05-26 DIAGNOSIS — Z882 Allergy status to sulfonamides status: Secondary | ICD-10-CM | POA: Insufficient documentation

## 2015-05-26 DIAGNOSIS — K219 Gastro-esophageal reflux disease without esophagitis: Secondary | ICD-10-CM | POA: Insufficient documentation

## 2015-05-26 DIAGNOSIS — E782 Mixed hyperlipidemia: Secondary | ICD-10-CM | POA: Diagnosis not present

## 2015-05-26 DIAGNOSIS — Z87442 Personal history of urinary calculi: Secondary | ICD-10-CM | POA: Diagnosis not present

## 2015-05-26 DIAGNOSIS — E039 Hypothyroidism, unspecified: Secondary | ICD-10-CM | POA: Insufficient documentation

## 2015-05-26 DIAGNOSIS — N189 Chronic kidney disease, unspecified: Secondary | ICD-10-CM | POA: Insufficient documentation

## 2015-05-26 DIAGNOSIS — R42 Dizziness and giddiness: Secondary | ICD-10-CM | POA: Diagnosis not present

## 2015-05-26 DIAGNOSIS — R9439 Abnormal result of other cardiovascular function study: Secondary | ICD-10-CM | POA: Diagnosis present

## 2015-05-26 DIAGNOSIS — Z7901 Long term (current) use of anticoagulants: Secondary | ICD-10-CM | POA: Insufficient documentation

## 2015-05-26 DIAGNOSIS — Z880 Allergy status to penicillin: Secondary | ICD-10-CM | POA: Diagnosis not present

## 2015-05-26 DIAGNOSIS — E1122 Type 2 diabetes mellitus with diabetic chronic kidney disease: Secondary | ICD-10-CM | POA: Diagnosis not present

## 2015-05-26 DIAGNOSIS — I2510 Atherosclerotic heart disease of native coronary artery without angina pectoris: Secondary | ICD-10-CM | POA: Insufficient documentation

## 2015-05-26 DIAGNOSIS — Z8249 Family history of ischemic heart disease and other diseases of the circulatory system: Secondary | ICD-10-CM | POA: Diagnosis not present

## 2015-05-26 DIAGNOSIS — Z87891 Personal history of nicotine dependence: Secondary | ICD-10-CM | POA: Diagnosis not present

## 2015-05-26 DIAGNOSIS — Z86718 Personal history of other venous thrombosis and embolism: Secondary | ICD-10-CM | POA: Insufficient documentation

## 2015-05-26 DIAGNOSIS — I872 Venous insufficiency (chronic) (peripheral): Secondary | ICD-10-CM | POA: Diagnosis not present

## 2015-05-26 DIAGNOSIS — R5383 Other fatigue: Secondary | ICD-10-CM | POA: Insufficient documentation

## 2015-05-26 DIAGNOSIS — Z940 Kidney transplant status: Secondary | ICD-10-CM | POA: Insufficient documentation

## 2015-05-26 DIAGNOSIS — Z823 Family history of stroke: Secondary | ICD-10-CM | POA: Diagnosis not present

## 2015-05-26 DIAGNOSIS — J45909 Unspecified asthma, uncomplicated: Secondary | ICD-10-CM | POA: Insufficient documentation

## 2015-05-26 DIAGNOSIS — I5032 Chronic diastolic (congestive) heart failure: Secondary | ICD-10-CM | POA: Diagnosis not present

## 2015-05-26 DIAGNOSIS — I130 Hypertensive heart and chronic kidney disease with heart failure and stage 1 through stage 4 chronic kidney disease, or unspecified chronic kidney disease: Secondary | ICD-10-CM | POA: Diagnosis not present

## 2015-05-26 HISTORY — PX: CARDIAC CATHETERIZATION: SHX172

## 2015-05-26 LAB — PROTIME-INR
INR: 1.05 (ref 0.00–1.49)
Prothrombin Time: 13.9 seconds (ref 11.6–15.2)

## 2015-05-26 LAB — GLUCOSE, CAPILLARY: Glucose-Capillary: 130 mg/dL — ABNORMAL HIGH (ref 65–99)

## 2015-05-26 LAB — POCT ACTIVATED CLOTTING TIME: ACTIVATED CLOTTING TIME: 270 s

## 2015-05-26 SURGERY — LEFT HEART CATH AND CORONARY ANGIOGRAPHY

## 2015-05-26 MED ORDER — ASPIRIN 81 MG PO CHEW
CHEWABLE_TABLET | ORAL | Status: AC
  Filled 2015-05-26: qty 1

## 2015-05-26 MED ORDER — FENTANYL CITRATE (PF) 100 MCG/2ML IJ SOLN
INTRAMUSCULAR | Status: DC | PRN
  Administered 2015-05-26 (×2): 25 ug via INTRAVENOUS

## 2015-05-26 MED ORDER — MIDAZOLAM HCL 2 MG/2ML IJ SOLN
INTRAMUSCULAR | Status: AC
  Filled 2015-05-26: qty 2

## 2015-05-26 MED ORDER — FENTANYL CITRATE (PF) 100 MCG/2ML IJ SOLN
INTRAMUSCULAR | Status: AC
  Filled 2015-05-26: qty 2

## 2015-05-26 MED ORDER — HEPARIN (PORCINE) IN NACL 2-0.9 UNIT/ML-% IJ SOLN
INTRAMUSCULAR | Status: DC | PRN
  Administered 2015-05-26: 09:00:00 via INTRA_ARTERIAL

## 2015-05-26 MED ORDER — SODIUM CHLORIDE 0.9 % WEIGHT BASED INFUSION: 1.0000 mL/kg/h | INTRAVENOUS | Status: DC

## 2015-05-26 MED ORDER — VERAPAMIL HCL 2.5 MG/ML IV SOLN
INTRAVENOUS | Status: AC
  Filled 2015-05-26: qty 2

## 2015-05-26 MED ORDER — SODIUM CHLORIDE 0.9 % IJ SOLN: 3.0000 mL | INTRAMUSCULAR | Status: DC | PRN

## 2015-05-26 MED ORDER — LIDOCAINE HCL (PF) 1 % IJ SOLN
INTRAMUSCULAR | Status: AC
  Filled 2015-05-26: qty 30

## 2015-05-26 MED ORDER — IOHEXOL 350 MG/ML SOLN
INTRAVENOUS | Status: DC | PRN
  Administered 2015-05-26: 90 mL via INTRAVENOUS

## 2015-05-26 MED ORDER — SODIUM CHLORIDE 0.9 % WEIGHT BASED INFUSION
3.0000 mL/kg/h | INTRAVENOUS | Status: DC
  Administered 2015-05-26: 3 mL/kg/h via INTRAVENOUS

## 2015-05-26 MED ORDER — ADENOSINE 12 MG/4ML IV SOLN
16.0000 mL | Freq: Once | INTRAVENOUS | Status: DC
  Filled 2015-05-26: qty 16

## 2015-05-26 MED ORDER — NITROGLYCERIN 1 MG/10 ML FOR IR/CATH LAB
INTRA_ARTERIAL | Status: DC | PRN
  Administered 2015-05-26: 10:00:00

## 2015-05-26 MED ORDER — HEPARIN (PORCINE) IN NACL 2-0.9 UNIT/ML-% IJ SOLN
INTRAMUSCULAR | Status: AC
  Filled 2015-05-26: qty 1000

## 2015-05-26 MED ORDER — HEPARIN SODIUM (PORCINE) 1000 UNIT/ML IJ SOLN
INTRAMUSCULAR | Status: AC
  Filled 2015-05-26: qty 1

## 2015-05-26 MED ORDER — SODIUM CHLORIDE 0.9 % IV SOLN: 250.0000 mL | INTRAVENOUS | Status: DC | PRN

## 2015-05-26 MED ORDER — NITROGLYCERIN 1 MG/10 ML FOR IR/CATH LAB
INTRA_ARTERIAL | Status: AC
  Filled 2015-05-26: qty 10

## 2015-05-26 MED ORDER — ADENOSINE (DIAGNOSTIC) 140MCG/KG/MIN
INTRAVENOUS | Status: DC | PRN
  Administered 2015-05-26: 140 ug/kg/min via INTRAVENOUS

## 2015-05-26 MED ORDER — HEPARIN SODIUM (PORCINE) 1000 UNIT/ML IJ SOLN
INTRAMUSCULAR | Status: DC | PRN
  Administered 2015-05-26: 5000 [IU] via INTRAVENOUS
  Administered 2015-05-26: 4000 [IU] via INTRAVENOUS

## 2015-05-26 MED ORDER — MIDAZOLAM HCL 2 MG/2ML IJ SOLN
INTRAMUSCULAR | Status: DC | PRN
  Administered 2015-05-26: 2 mg via INTRAVENOUS
  Administered 2015-05-26: 1 mg via INTRAVENOUS

## 2015-05-26 MED ORDER — SODIUM CHLORIDE 0.9 % IJ SOLN: 3.0000 mL | Freq: Two times a day (BID) | INTRAMUSCULAR | Status: DC

## 2015-05-26 MED ORDER — ASPIRIN 81 MG PO CHEW
81.0000 mg | CHEWABLE_TABLET | ORAL | Status: AC
  Administered 2015-05-26: 81 mg via ORAL

## 2015-05-26 SURGICAL SUPPLY — 16 items
CATH INFINITI 5 FR JL3.5 (CATHETERS) ×2 IMPLANT
CATH INFINITI 5FR ANG PIGTAIL (CATHETERS) ×2 IMPLANT
CATH INFINITI JR4 5F (CATHETERS) ×2 IMPLANT
CATH LAUNCHER 6FR 3DRIGHT (CATHETERS) IMPLANT
CATHETER LAUNCHER 6FR 3DRIGHT (CATHETERS) ×2
DEVICE RAD COMP TR BAND LRG (VASCULAR PRODUCTS) ×2 IMPLANT
GLIDESHEATH SLEND SS 6F .021 (SHEATH) ×2 IMPLANT
GUIDE CATH RUNWAY 6FR CLS3 (CATHETERS) ×1 IMPLANT
GUIDEWIRE PRESSURE COMET II (WIRE) ×1 IMPLANT
KIT HEART LEFT (KITS) ×2 IMPLANT
PACK CARDIAC CATHETERIZATION (CUSTOM PROCEDURE TRAY) ×2 IMPLANT
SYR MEDRAD MARK V 150ML (SYRINGE) ×2 IMPLANT
TRANSDUCER W/STOPCOCK (MISCELLANEOUS) ×2 IMPLANT
TUBING CIL FLEX 10 FLL-RA (TUBING) ×3 IMPLANT
VALVE GUARDIAN II ~~LOC~~ HEMO (MISCELLANEOUS) ×1 IMPLANT
WIRE SAFE-T 1.5MM-J .035X260CM (WIRE) ×2 IMPLANT

## 2015-05-26 NOTE — Interval H&P Note (Signed)
Cath Lab Visit (complete for each Cath Lab visit)  Clinical Evaluation Leading to the Procedure:   ACS: No.  Non-ACS:    Anginal Classification: CCS II  Anti-ischemic medical therapy: One medication  Non-Invasive Test Results: Intermediate-risk stress test findings: cardiac mortality 1-3%/year  Prior CABG: No previous CABG      History and Physical Interval Note:  05/26/2015 9:02 AM  [name]  has presented today for surgery, with the diagnosis of abnormal nuc  The various methods of treatment have been discussed with the patient and family. After consideration of risks, benefits and other options for treatment, the patient has consented to  Procedure(s): Left Heart Cath and Coronary Angiography (N/A) as a surgical intervention .  The patient's history has been reviewed, patient examined, no change in status, stable for surgery.  I have reviewed the patient's chart and labs.  Questions were answered to the patient's satisfaction.     Wister Hoefle S.

## 2015-05-26 NOTE — H&P (View-Only) (Signed)
Cardiology Office Note   Date:  05/20/2015   ID:  [name], DOB 01/13/1941, MRN [id number]  PCP:  Purvis Kilts, MD  Cardiologist:   Sharol Harness, MD   Chief Complaint  Patient presents with  . Follow-up    stress test results pt states no chest pain no SOB no some dizziness but pt states she has medication for it  . Edema    both feet but the right is worse      Patient ID: [name] is a 75 y.o. female with hypertension, hyperlipidemia, prior DVT on warfarin, and diabetes type 2 who presents to establish care.    Interval history 05/20/15:  [name] had an exercise nuclear stress test that was equivocal. It was likely negative with breast attenuation artifact. It did show that her blood pressure was poorly controlled, as she had a hypertensive response to exercise with systolic blood pressure up to 209 mmHg. She denies any chest pain.  She does have pain in her right breast.  This can occur both with exertion at with rest.  It is not associated with shortness of breath, nausea, vomiting, lightheadedness, or diaphoresis.  She notices it every time she tries to sweep a floor.  She is unable to do more vigorous activity due to fatigue that has been present ever since she had rheumatic fever at age 55. At her last appointment [name] increased her pravastatin to 40 mg from 10 mg because her lipids were not at goal.  After taking this dose for several days she noted severe body aches and chills. She felt like she had the flu. She decreased the dose back to 10 mg and felt okay. She has noted some lower extremity edema but she thinks this is because she's not taking her diuretics in the last 2 days. She recently went on a car trip and was afraid of taking it because she would not have easy access to the bathroom. She denies orthopnea or PND.   History of Present Illness 04/16/15:  [name] was previously a patient of Dr. Sallyanne Kuster but has not been seen  lately.  She neglected her health because she was caring for husband who was on hemodialysis for 14 months.  He recently had a kidney transplant earlier this year.    [name] has been feeling "so-so."   She has been following up with her Nephrologist who recommended that she see a cardiologist.  He recommended that her losartan be cut in half due to her renal function.  Ms. Stonis notes occasional chest pressure that improves with belching.  Sometimes it does not go completely away.  The episodes occur sporadically and are not always associated with exertion.  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  She tries to park in the farthest parking spot and does a lot of walking while she is there. She denies any CP or SOB with ambulation.  She notices pain in her legs when laying down.  She also reports R calf pain and at times her foot feels very cold.    She also reports LE edema that improves with elevation.  She denies orthopnea or PND  She reports frequent dizziness that she improves with antivert. She had one episode of severe vertigo that didn't improve with antivert.   Past Medical History  Diagnosis Date  . Hypertension   . Diabetes mellitus  x 5 yrs  . Hypothyroidism   . DVT of axillary vein, acute left 07/24/12  . Back pain   . Vertigo     chonic  . Mixed hyperlipidemia   . GERD (gastroesophageal reflux disease)   . Glaucoma   . Sigmoid diverticulitis   . Asthmatic bronchitis   . Pinched nerve     right elbow  . Chronic kidney disease     kidney function low  . Antral gastritis     EGD 11/15  . Kidney stones   . Peripheral venous insufficiency   . Chronic diastolic heart failure (HCC) 05/20/2015    Grade 2 diastolic dysfunction.  04/2015.  [name] Hyperlipidemia 05/20/2015    Past Surgical History  Procedure Laterality Date  . Other surgical history      colostomy, colostomy reversal, for diverticulitis surgical hernia repair, arm  surgery, neck surgery  . Abdominal hysterectomy    . Fracture left foot    . Eye surgery    . Back surgery      spinal   . Cholecystectomy    . Colon surgery    . Hernia repair    . Colostomy closure    . Colonoscopy N/A 12/27/2013    Procedure: COLONOSCOPY;  Surgeon: Rogene Houston, MD;  Location: [facility];  Service: Endoscopy;  Laterality: N/A;  200  . Esophagogastroduodenoscopy N/A 05/07/2014    Procedure: ESOPHAGOGASTRODUODENOSCOPY (EGD);  Surgeon: Rogene Houston, MD;  Location: [facility];  Service: Endoscopy;  Laterality: N/A;  . US echocardiography  02/11/2010    Mild MR,trace TR & AI  . Nm myocar perf wall motion  01/28/2009    Normal     Current Outpatient Prescriptions  Medication Sig Dispense Refill  . allopurinol (ZYLOPRIM) 100 MG tablet Take 100 mg by mouth daily.    [name] amitriptyline (ELAVIL) 25 MG tablet Take 25 mg by mouth at bedtime.    . Black Cohosh 40 MG CAPS Take 40 mg by mouth 2 (two) times daily.    . cetirizine (ZYRTEC) 10 MG tablet Take 10 mg by mouth daily.    [name] esomeprazole (NEXIUM) 20 MG capsule Take 20 mg by mouth daily at 12 noon.    . furosemide (LASIX) 40 MG tablet Take 40 mg by mouth 2 (two) times daily.    [name] latanoprost (XALATAN) 0.005 % ophthalmic solution 1 drop at bedtime.    [name] losartan (COZAAR) 50 MG tablet Take 25 mg by mouth daily.    . meclizine (ANTIVERT) 25 MG tablet Take 25 mg by mouth as needed for dizziness.     . Omega-3 Fatty Acids (FISH OIL) 1200 MG CAPS Take 1 capsule by mouth 2 (two) times daily.    . polyethylene glycol (MIRALAX / GLYCOLAX) packet Take 17 g by mouth daily.    . potassium chloride SA (K-DUR,KLOR-CON) 20 MEQ tablet Take 20 mEq by mouth daily.    . pravastatin (PRAVACHOL) 10 MG tablet Take 1 tablet (10 mg total) by mouth 2 (two) times daily. 180 tablet 3  . topiramate (TOPAMAX) 25 MG tablet Take 25 mg by mouth daily.     . traMADol (ULTRAM) 50 MG tablet Take 50 mg by mouth every 8 (eight) hours as needed. for  pain  2  . warfarin (COUMADIN) 1 MG tablet Take 1 mg by mouth daily.  11  . carvedilol (COREG) 6.25 MG tablet Take 1 tablet (6.25 mg total) by mouth 2 (two) times daily. 180 tablet  3   No current facility-administered medications for this visit.    Allergies:   Vioxx; Penicillins; Codeine; Motrin; and Sulfur    Social History:  The patient  reports that she quit smoking about 17 months ago. Her smoking use included Cigarettes. She has never used smokeless tobacco. She reports that she does not drink alcohol or use illicit drugs.   Family History:  The patient's family history includes CVA (age of onset: 1) in her maternal grandmother; Cancer in her mother; Cancer (age of onset: 37) in her sister; Heart attack (age of onset: 16) in her brother; Heart disease in her maternal grandmother; Other (age of onset: 35) in her mother; Stroke in her maternal grandmother.    ROS:  Please see the history of present illness.   Otherwise, review of systems are positive for kidney stone, easy bleeding.   All other systems are reviewed and negative.    PHYSICAL EXAM: VS:  BP 142/78 mmHg  Pulse 86  Ht 5' 2.5" (1.588 m)  Wt 81.239 kg (179 lb 1.6 oz)  BMI 32.22 kg/m2 , BMI Body mass index is 32.22 kg/(m^2). GENERAL:  Well appearing HEENT:  Pupils equal round and reactive, fundi not visualized, oral mucosa unremarkable NECK:  No jugular venous distention, waveform within normal limits, carotid upstroke brisk and symmetric, no bruits, no thyromegaly LYMPHATICS:  No cervical adenopathy LUNGS:  Clear to auscultation bilaterally HEART:  RRR.  PMI not displaced or sustained,S1 and S2 within normal limits, no S3, no S4, no clicks, no rubs, no murmurs ABD:  Flat, positive bowel sounds normal in frequency in pitch, no bruits, no rebound, no guarding, no midline pulsatile mass, no hepatomegaly, no splenomegaly EXT:  2 plus radial pulses, 1+ DP/PT bilaterally, 1+ LE edema to the upper tibia bilaterally, no cyanosis  no clubbing SKIN:  No rashes no nodules.  Erythema of bilateral anterior tibia.  Not warm to touch.   NEURO:  Cranial nerves II through XII grossly intact, motor grossly intact throughout PSYCH:  Cognitively intact, oriented to person place and time   EKG:  EKG is ordered today. The ekg ordered today demonstrates sinus rhythm at 89 bpm.  RBBB.  Exercise Myoview 04/30/15  The left ventricular ejection fraction is hyperdynamic (>65%).  Nuclear stress EF: 78%.  Blood pressure demonstrated a hypertensive response to exercise.  Upsloping ST segment depression ST segment depression was noted during stress in the III, II and aVF leads. This is not diagnostic for ischemia.  Defect 1: There is a small defect of moderate severity present in the mid anterior and apical anterior location. Wall motion is normal, so this is likely artifact due to breast attenuation. However, cannot rule out LAD ischemia.  This is a low risk study.   Recent Labs: 04/17/2015: ALT 23; BNP 44.3; BUN 12; Creatinine, Ser 1.07*; Potassium 4.5; Sodium 146*; TSH 1.380   03/12/15: INR 2.9   Lipid Panel    Component Value Date/Time   CHOL 237* 04/17/2015 1010   TRIG 166* 04/17/2015 1010   HDL 43 04/17/2015 1010   LDLCALC 161* 04/17/2015 1010      Wt Readings from Last 3 Encounters:  05/20/15 81.239 kg (179 lb 1.6 oz)  04/30/15 78.019 kg (172 lb)  04/16/15 78.064 kg (172 lb 1.6 oz)      ASSESSMENT AND PLAN:  # Abnormal stress test/atypical chest pain: We discussed the fact that her stress test is likely normal. However I cannot guarantee that there is no anterior  ischemia. We discussed the options of continuing to manage her medically versus proceeding with cardiac catheterization for definitive diagnosis. She prefers to undergo cardiac catheterization.  # Grade 2 diastolic dysfunction: Ms. Cotes has lower extremity edema that is was not present on her last exam. This is likely due to her not taking her  Lasix recently. She will resume Lasix as ordered. Her echo did show grade 2 diastolic dysfunction. We will start carvedilol 6.25 mg twice daily  , as she will likely benefit from a lower heart rate. Also, her blood pressure was not well-controlled during her stress test and has been elevated in clinic. She is now amenable to starting an additional medication.  # Hypertension: BP  remains above goal today. Continue Losartan 25mg  daily.  She had worsened renal function at higher doses.  Start carvedilol 6.25 mg twice daily.   # Leg pain : Ms. Zettle reports leg pain with ambulation and at rest.   ABIs were normal bilaterally.   # Hyperlipidemia: pravastatin was increased to 40 mg due to lipids not being at goal. She was unable to tolerate this dose and has started back taking 10 mg. She will now take 20 mg and see if she can tolerate that dose. If not we will have to decrease back to 10 mg. She has not tolerated rosuvastatin in the past.    Current medicines are reviewed at length with the patient today.  The patient does not have concerns regarding medicines.  The following changes have been made:  no change  Labs/ tests ordered today include:   Orders Placed This Encounter  Procedures  . DG Chest 2 View  . CBC  . Basic metabolic panel  . Protime-INR  . APTT  . LEFT HEART CATHETERIZATION WITH CORONARY/GRAFT ANGIOGRAM     Disposition:   FU with Felicitas Sine C. Oval Linsey, MD in 6 months    Signed, Sharol Harness, MD  05/20/2015 9:41 AM    [place]

## 2015-05-26 NOTE — Discharge Instructions (Signed)
Angiogram, Care After °Refer to this sheet in the next few weeks. These instructions provide you with information about caring for yourself after your procedure. Your health care provider may also give you more specific instructions. Your treatment has been planned according to current medical practices, but problems sometimes occur. Call your health care provider if you have any problems or questions after your procedure. °WHAT TO EXPECT AFTER THE PROCEDURE °After your procedure, it is typical to have the following: °· Bruising at the catheter insertion site that usually fades within 1-2 weeks. °· Blood collecting in the tissue (hematoma) that may be painful to the touch. It should usually decrease in size and tenderness within 1-2 weeks. °HOME CARE INSTRUCTIONS °· Take medicines only as directed by your health care provider. °· You may shower 24-48 hours after the procedure or as directed by your health care provider. Remove the bandage (dressing) and gently wash the site with plain soap and water. Pat the area dry with a clean towel. Do not rub the site, because this may cause bleeding. °· Do not take baths, swim, or use a hot tub until your health care provider approves. °· Check your insertion site every day for redness, swelling, or drainage. °· Do not apply powder or lotion to the site. °· Do not lift over 10 lb (4.5 kg) for 5 days after your procedure or as directed by your health care provider. °· Ask your health care provider when it is okay to: °¨ Return to work or school. °¨ Resume usual physical activities or sports. °¨ Resume sexual activity. °· Do not drive home if you are discharged the same day as the procedure. Have someone else drive you. °· You may drive 24 hours after the procedure unless otherwise instructed by your health care provider. °· Do not operate machinery or power tools for 24 hours after the procedure or as directed by your health care provider. °· If your procedure was done as an  outpatient procedure, which means that you went home the same day as your procedure, a responsible adult should be with you for the first 24 hours after you arrive home. °· Keep all follow-up visits as directed by your health care provider. This is important. °SEEK MEDICAL CARE IF: °· You have a fever. °· You have chills. °· You have increased bleeding from the catheter insertion site. Hold pressure on the site. °SEEK IMMEDIATE MEDICAL CARE IF: °· You have unusual pain at the catheter insertion site. °· You have redness, warmth, or swelling at the catheter insertion site. °· You have drainage (other than a small amount of blood on the dressing) from the catheter insertion site. °· The catheter insertion site is bleeding, and the bleeding does not stop after 30 minutes of holding steady pressure on the site. °· The area near or just beyond the catheter insertion site becomes pale, cool, tingly, or numb. °  °This information is not intended to replace advice given to you by your health care provider. Make sure you discuss any questions you have with your health care provider. °  °Document Released: 01/07/2005 Document Revised: 07/12/2014 Document Reviewed: 11/22/2012 °Elsevier Interactive Patient Education ©2016 [employer] ° °

## 2015-05-26 NOTE — Research (Signed)
CADLAD Study Informed Consent   Subject Name: [name]  Subject met inclusion and exclusion criteria.  The informed consent form, study requirements and expectations were reviewed with the subject and questions and concerns were addressed prior to the signing of the [place] Study consent form.  The subject verbalized understanding of the trail requirements.  The subject agreed to participate in the  trial and signed the informed consent.  The informed consent was obtained prior to performance of any protocol-specific procedures for the subject.  A copy of the signed informed consent was given to the subject and a copy was placed in the subject's medical record.  [name] 05/26/2015, 07:52 AM

## 2015-05-27 NOTE — Telephone Encounter (Signed)
Called patient to notify her of Dr Hassell Done instructions. Patient hadn't restarted warfarin. Told her to go ahead a resume her warfarin at her previous schedule. Patient understood and agreed with plan.   Per cath report, patient needed a follow-up appointment with in the week. Made patient an appointment with Dr Oval Linsey 06/04/15 at 8:45a - first availble. Patient verbalized understanding.

## 2015-05-27 NOTE — Telephone Encounter (Signed)
I spoke with [name] regarding [name]. [name] said that the patient was fine to proceed without Lovenox bridging. [name] notified. Cath orders placed. Cath completed. Patient to restart warfarin 05/26/15 post cath.   Will contact patient today to notify her she needs an INR checked at the end of this week (11/25 or 11/26 at the latest 11/28).

## 2015-05-28 ENCOUNTER — Telehealth: Payer: Self-pay | Admitting: *Deleted

## 2015-05-28 NOTE — Telephone Encounter (Signed)
-----   Message from Skeet Latch, MD sent at 05/21/2015  8:28 AM EST ----- There was either some scarring or under-inflated lung tissue on the left.  The radiologist also saw calcification of the right rotator cuff tendon.

## 2015-05-28 NOTE — Telephone Encounter (Signed)
Patient stated she has a standing INR order at the [facility] in [place]. She will have her INR checked there at the end of the week and will report to [facility] (as they follow/dose her coumadin).

## 2015-05-28 NOTE — Telephone Encounter (Signed)
Spoke to patient. Result given . Verbalized understanding  

## 2015-06-02 DIAGNOSIS — Z7901 Long term (current) use of anticoagulants: Secondary | ICD-10-CM | POA: Diagnosis not present

## 2015-06-03 NOTE — Progress Notes (Signed)
Cardiology Office Note   Date:  06/04/2015   ID:  [name], DOB Apr 05, 1941, MRN [id number]  PCP:  Purvis Kilts, MD  Cardiologist:   Sharol Harness, MD   Chief Complaint  Patient presents with  . Follow-up    CATH, no intervention//pt c/o nausea that started a week ago, comes and goes, sometimes dizzy.  . Edema    constant in bilateral legs/ankles/feet, right side is worse.//redness  . Shortness of Breath    on exertion      Patient ID: [name] is a 74 y.o. female with hypertension, hyperlipidemia, prior DVT on warfarin, and diabetes type 2 who presents to establish care.     Interval history 06/04/15: [name] underwent cardiac catheterization on 11/21 that revealed a 50% LAD lesion.  There was concern that there may have been a component of vasospasm so long-acting nitrates were suggested.  She resumed warfarin post catheterization.  At her last appointment she was started on carvedilol for hypertension and diastolic dysfunction and pravastatin was resumed at low dose.  He reports that she continues to have episodes of chest discomfort that do not last very long.They are associated with shortness of breath. She also notes shortness of breath with minimal exertion and was singing in the choir at church.She feels that her breathing is worse as the weather gets colder.She reports a history of asthma and used to get relief with an albuterol inhaler, though she does not have anymore this prescription.Ms. Mcbath reports lower extremity edema that does not improve with elevation each morning She takes her Lasix as prescribed most days, but sometimes doesn't take her morning dose if she has to go out for the day.  She denies orthopnea or PND, though she sleeps with the head of her bed elevated for gastroesophageal reflux disease.  Interval history 05/20/15:  [name] had an exercise nuclear stress test that was equivocal. It was likely negative with breast  attenuation artifact. It did show that her blood pressure was poorly controlled, as she had a hypertensive response to exercise with systolic blood pressure up to 209 mmHg. She denies any chest pain.  She does have pain in her right breast.  This can occur both with exertion at with rest.  It is not associated with shortness of breath, nausea, vomiting, lightheadedness, or diaphoresis.  She notices it every time she tries to sweep a floor.  She is unable to do more vigorous activity due to fatigue that has been present ever since she had rheumatic fever at age 61. At her last appointment [name] increased her pravastatin to 40 mg from 10 mg because her lipids were not at goal.  After taking this dose for several days she noted severe body aches and chills. She felt like she had the flu. She decreased the dose back to 10 mg and felt okay. She has noted some lower extremity edema but she thinks this is because she's not taking her diuretics in the last 2 days. She recently went on a car trip and was afraid of taking it because she would not have easy access to the bathroom. She denies orthopnea or PND.   History of Present Illness 04/16/15:  [name] was previously a patient of Dr. Sallyanne Kuster but has not been seen lately.  She neglected her health because she was caring for husband who was on hemodialysis for 14 months.  He recently had a kidney transplant earlier this year.  [name] has been feeling "so-so."   She has been following up with her Nephrologist who recommended that she see a cardiologist.  He recommended that her losartan be cut in half due to her renal function.  Ms. Hock notes occasional chest pressure that improves with belching.  Sometimes it does not go completely away.  The episodes occur sporadically and are not always associated with exertion.  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  She tries to park in the farthest parking spot and  does a lot of walking while she is there. She denies any CP or SOB with ambulation.  She notices pain in her legs when laying down.  She also reports R calf pain and at times her foot feels very cold.    She also reports LE edema that improves with elevation.  She denies orthopnea or PND  She reports frequent dizziness that she improves with antivert. She had one episode of severe vertigo that didn't improve with antivert.   Past Medical History  Diagnosis Date  . Hypertension   . Diabetes mellitus     x 5 yrs  . Hypothyroidism   . DVT of axillary vein, acute left 07/24/12  . Back pain   . Vertigo     chonic  . Mixed hyperlipidemia   . GERD (gastroesophageal reflux disease)   . Glaucoma   . Sigmoid diverticulitis   . Asthmatic bronchitis   . Pinched nerve     right elbow  . Chronic kidney disease     kidney function low  . Antral gastritis     EGD 11/15  . Kidney stones   . Peripheral venous insufficiency   . Chronic diastolic heart failure (HCC) 05/20/2015    Grade 2 diastolic dysfunction.  04/2015.  [name] Hyperlipidemia 05/20/2015    Past Surgical History  Procedure Laterality Date  . Other surgical history      colostomy, colostomy reversal, for diverticulitis surgical hernia repair, arm surgery, neck surgery  . Abdominal hysterectomy    . Fracture left foot    . Eye surgery    . Back surgery      spinal   . Cholecystectomy    . Colon surgery    . Hernia repair    . Colostomy closure    . Colonoscopy N/A 12/27/2013    Procedure: COLONOSCOPY;  Surgeon: Rogene Houston, MD;  Location: [facility];  Service: Endoscopy;  Laterality: N/A;  200  . Esophagogastroduodenoscopy N/A 05/07/2014    Procedure: ESOPHAGOGASTRODUODENOSCOPY (EGD);  Surgeon: Rogene Houston, MD;  Location: [facility];  Service: Endoscopy;  Laterality: N/A;  . US echocardiography  02/11/2010    Mild MR,trace TR & AI  . Nm myocar perf wall motion  01/28/2009    Normal  . Cardiac catheterization N/A  05/26/2015    Procedure: Left Heart Cath and Coronary Angiography;  Surgeon: Jettie Booze, MD;  Location: [facility];  Service: Cardiovascular;  Laterality: N/A;     Current Outpatient Prescriptions  Medication Sig Dispense Refill  . allopurinol (ZYLOPRIM) 100 MG tablet Take 100 mg by mouth daily as needed.     [name] amitriptyline (ELAVIL) 25 MG tablet Take 50 mg by mouth at bedtime.     . Black Cohosh 40 MG CAPS Take 40 mg by mouth 2 (two) times daily.    . carvedilol (COREG) 6.25 MG tablet Take 1 tablet (6.25 mg total) by  mouth 2 (two) times daily. 180 tablet 3  . cetirizine (ZYRTEC) 10 MG tablet Take 10 mg by mouth daily.    [name] esomeprazole (NEXIUM) 20 MG capsule Take 20 mg by mouth daily at 12 noon.    . furosemide (LASIX) 40 MG tablet Take 40 mg by mouth 2 (two) times daily.    [name] latanoprost (XALATAN) 0.005 % ophthalmic solution Place 1 drop into both eyes at bedtime.     [name] losartan (COZAAR) 50 MG tablet Take 25 mg by mouth daily.    . meclizine (ANTIVERT) 25 MG tablet Take 25 mg by mouth as needed for dizziness.     . Omega-3 Fatty Acids (FISH OIL) 1200 MG CAPS Take 1 capsule by mouth 2 (two) times daily.    . polyethylene glycol (MIRALAX / GLYCOLAX) packet Take 17 g by mouth daily.    . potassium chloride SA (K-DUR,KLOR-CON) 20 MEQ tablet Take 10 mEq by mouth daily.     . pravastatin (PRAVACHOL) 10 MG tablet Take 1 tablet (10 mg total) by mouth 2 (two) times daily. 180 tablet 3  . traMADol (ULTRAM) 50 MG tablet Take 50 mg by mouth every 8 (eight) hours as needed. for pain  2  . warfarin (COUMADIN) 1 MG tablet Take 1 mg by mouth daily.  11  . albuterol (PROVENTIL HFA;VENTOLIN HFA) 108 (90 BASE) MCG/ACT inhaler Inhale 2 puffs into the lungs every 4 (four) hours as needed for shortness of breath. 1 Inhaler 0  . isosorbide mononitrate (IMDUR) 30 MG 24 hr tablet Take 1 tablet (30 mg total) by mouth daily. 90 tablet 3   No current facility-administered medications for this visit.     Allergies:   Vioxx; Penicillins; Codeine; Motrin; and Sulfur    Social History:  The patient  reports that she quit smoking about 17 months ago. Her smoking use included Cigarettes. She has never used smokeless tobacco. She reports that she does not drink alcohol or use illicit drugs.   Family History:  The patient's family history includes CVA (age of onset: 29) in her maternal grandmother; Cancer in her mother; Cancer (age of onset: 91) in her sister; Heart attack (age of onset: 43) in her brother; Heart disease in her maternal grandmother; Other (age of onset: 55) in her mother; Stroke in her maternal grandmother.    ROS:  Please see the history of present illness.   Otherwise, review of systems are positive for kidney stone, easy bleeding.   All other systems are reviewed and negative.    PHYSICAL EXAM: VS:  BP 134/80 mmHg  Pulse 72  Ht 5' 2.5" (1.588 m)  Wt 79.697 kg (175 lb 11.2 oz)  BMI 31.60 kg/m2 , BMI Body mass index is 31.6 kg/(m^2). GENERAL:  Well appearing HEENT:  Pupils equal round and reactive, fundi not visualized, oral mucosa unremarkable NECK:  No jugular venous distention, waveform within normal limits, carotid upstroke brisk and symmetric, no bruits, no thyromegaly LYMPHATICS:  No cervical adenopathy LUNGS:  Clear to auscultation bilaterally HEART:  RRR.  PMI not displaced or sustained,S1 and S2 within normal limits, no S3, no S4, no clicks, no rubs, no murmurs ABD:  Flat, positive bowel sounds normal in frequency in pitch, no bruits, no rebound, no guarding, no midline pulsatile mass, no hepatomegaly, no splenomegaly EXT:  2 plus radial pulses, 1+ DP/PT bilaterally, 1+ LE edema to the upper tibia bilaterally, no cyanosis no clubbing SKIN:  No rashes no nodules.  Erythema of bilateral  anterior tibia.  Not warm to touch.   NEURO:  Cranial nerves II through XII grossly intact, motor grossly intact throughout PSYCH:  Cognitively intact, oriented to person place and  time   EKG:  EKG is not ordered today.  Exercise Myoview 04/30/15  The left ventricular ejection fraction is hyperdynamic (>65%).  Nuclear stress EF: 78%.  Blood pressure demonstrated a hypertensive response to exercise.  Upsloping ST segment depression ST segment depression was noted during stress in the III, II and aVF leads. This is not diagnostic for ischemia.  Defect 1: There is a small defect of moderate severity present in the mid anterior and apical anterior location. Wall motion is normal, so this is likely artifact due to breast attenuation. However, cannot rule out LAD ischemia.  This is a low risk study.  Cardiac cath 05/26/15:  Mid LAD lesion, 50% stenosed. FFR of this lesion showed value of 0.85. This is felt to be nonsignificant.  Normal LVEDP.  Continue aggressive medical therapy. Would consider adding long-acting nitrate as the patient may have some component of vasospasm. Of note, in the future , if repeat catheterization was needed , would consider using a 5 [place] guide catheter as her left main is very short and the catheter tends to deep seat Into either the LAD or the circumflex.  Recent Labs: 04/17/2015: ALT 23; BNP 44.3 05/20/2015: BUN 14; Creatinine, Ser 0.98; Potassium 4.4; Sodium 147*; TSH 1.300   03/12/15: INR 2.9   Lipid Panel    Component Value Date/Time   CHOL 237* 04/17/2015 1010   TRIG 166* 04/17/2015 1010   HDL 43 04/17/2015 1010   LDLCALC 161* 04/17/2015 1010      Wt Readings from Last 3 Encounters:  06/04/15 79.697 kg (175 lb 11.2 oz)  05/26/15 77.111 kg (170 lb)  05/20/15 81.239 kg (179 lb 1.6 oz)      ASSESSMENT AND PLAN:  # Non-obstructive CAD: Ms. Boche ad an abnormal stress test and subsequently underwent cardiac catheterization that revealed nonobstructive coronary disease.However there was some concern for coronary spasms. Therefore we will start isosorbide mononitrate 30 mg daily.  She will continue carvedilol and  pravastatin.  # Grade 2 diastolic dysfunction/SOB: Ms. Walley has lower extremity edema that has not improved since her last appointment.We will increase Lasix to 80 mg in the morning and 40 mg in the evening for 2 days. If her edema has not improved she will take this dose for one additional day. She will continue carvedilol and losartan. We are adding a Imdur as above.  We will check a BNP.  We will also start albuterol for her shortness of breath as it has been responsive to this in the past.  # Hypertension: BP now well controlled on losartan and carvedilol.  # Leg pain : Ms. Devor reports leg pain with ambulation and at rest.   ABIs were normal bilaterally.   # Hyperlipidemia: Pravastatin was increased to 40 mg due to lipids not being at goal. She was unable to tolerate this dose and has started back taking 10 mg. She will now take 20 mg and see if she can tolerate that dose. If not we will have to decrease back to 10 mg. She has not tolerated rosuvastatin in the past.    Current medicines are reviewed at length with the patient today.  The patient does not have concerns regarding medicines.  The following changes have been made:  no change  Labs/ tests ordered today include:  Orders Placed This Encounter  Procedures  . B Nat Peptide     Disposition:   FU with Jayziah Bankhead C. Oval Linsey, MD in 6 months    Signed, Sharol Harness, MD  06/04/2015 9:57 AM    [place]

## 2015-06-04 ENCOUNTER — Ambulatory Visit (INDEPENDENT_AMBULATORY_CARE_PROVIDER_SITE_OTHER): Payer: Medicare Other | Admitting: Cardiovascular Disease

## 2015-06-04 ENCOUNTER — Encounter: Payer: Self-pay | Admitting: Cardiovascular Disease

## 2015-06-04 VITALS — BP 134/80 | HR 72 | Ht 62.5 in | Wt 175.7 lb

## 2015-06-04 DIAGNOSIS — R609 Edema, unspecified: Secondary | ICD-10-CM

## 2015-06-04 DIAGNOSIS — I10 Essential (primary) hypertension: Secondary | ICD-10-CM | POA: Diagnosis not present

## 2015-06-04 DIAGNOSIS — I5032 Chronic diastolic (congestive) heart failure: Secondary | ICD-10-CM | POA: Diagnosis not present

## 2015-06-04 DIAGNOSIS — R0602 Shortness of breath: Secondary | ICD-10-CM

## 2015-06-04 MED ORDER — ISOSORBIDE MONONITRATE ER 30 MG PO TB24: 30.0000 mg | ORAL_TABLET | Freq: Every day | ORAL | Status: DC

## 2015-06-04 MED ORDER — ALBUTEROL SULFATE HFA 108 (90 BASE) MCG/ACT IN AERS: 2.0000 | INHALATION_SPRAY | RESPIRATORY_TRACT | Status: AC | PRN

## 2015-06-04 NOTE — Patient Instructions (Signed)
Medication Instructions:  START IMDUR (ISOSORBIDE) 30 MG DAILY  START ALBUTEROL INHALER 2 PUFFS EVERY 4 HOURS AS NEEDED FOR SHORTNESS OF BREATH. ADDITIONAL REFILLS NEED TO COME FROM YOUR PRIMARY CARE PHYSICIAN  Labwork: BNP AT THE LAB DOWNSTAIRS  Testing/Procedures: NONE  Follow-Up: Your physician wants you to follow-up in: [place] will receive a reminder letter in the mail two months in advance. If you don't receive a letter, please call our office to schedule the follow-up appointment.  If you need a refill on your cardiac medications before your next appointment, please call your pharmacy.

## 2015-06-05 LAB — BRAIN NATRIURETIC PEPTIDE: Brain Natriuretic Peptide: 27.1 pg/mL (ref 0.0–100.0)

## 2015-06-09 ENCOUNTER — Telehealth: Payer: Self-pay | Admitting: *Deleted

## 2015-06-09 NOTE — Telephone Encounter (Signed)
Spoke to patient. Result given . Verbalized understanding  

## 2015-06-09 NOTE — Telephone Encounter (Signed)
-----   Message from Skeet Latch, MD sent at 06/06/2015  1:58 PM EST ----- Lab indicates that she does not have heart failure and that her heart is not the cause of the swelling in her legs.  Go back to her usual dose of lasix, 40 mg twice daily.  The swelling may be due to venous insufficiency.

## 2015-06-12 DIAGNOSIS — Z7901 Long term (current) use of anticoagulants: Secondary | ICD-10-CM | POA: Diagnosis not present

## 2015-06-28 DIAGNOSIS — E119 Type 2 diabetes mellitus without complications: Secondary | ICD-10-CM | POA: Diagnosis not present

## 2015-07-03 DIAGNOSIS — H401131 Primary open-angle glaucoma, bilateral, mild stage: Secondary | ICD-10-CM | POA: Diagnosis not present

## 2015-07-17 DIAGNOSIS — M545 Low back pain: Secondary | ICD-10-CM | POA: Diagnosis not present

## 2015-07-17 DIAGNOSIS — M1990 Unspecified osteoarthritis, unspecified site: Secondary | ICD-10-CM | POA: Diagnosis not present

## 2015-07-17 DIAGNOSIS — G894 Chronic pain syndrome: Secondary | ICD-10-CM | POA: Diagnosis not present

## 2015-07-18 DIAGNOSIS — D509 Iron deficiency anemia, unspecified: Secondary | ICD-10-CM | POA: Diagnosis not present

## 2015-07-18 DIAGNOSIS — Z7901 Long term (current) use of anticoagulants: Secondary | ICD-10-CM | POA: Diagnosis not present

## 2015-07-18 DIAGNOSIS — R809 Proteinuria, unspecified: Secondary | ICD-10-CM | POA: Diagnosis not present

## 2015-07-18 DIAGNOSIS — E559 Vitamin D deficiency, unspecified: Secondary | ICD-10-CM | POA: Diagnosis not present

## 2015-07-18 DIAGNOSIS — I10 Essential (primary) hypertension: Secondary | ICD-10-CM | POA: Diagnosis not present

## 2015-07-18 DIAGNOSIS — N183 Chronic kidney disease, stage 3 (moderate): Secondary | ICD-10-CM | POA: Diagnosis not present

## 2015-07-24 NOTE — Progress Notes (Signed)
Cardiology Office Note   Date:  07/25/2015   ID:  [name], DOB 05-01-1941, MRN [id number]  PCP:  Purvis Kilts, MD  Cardiologist:   Sharol Harness, MD   Chief Complaint  Patient presents with  . Follow-up     no chest pain,due to bronchitis slight shortness of breath, has edema, no pain or cramping in legs, has lightheadedness or dizziness      Patient ID: [name] is a 75 y.o. female with hypertension, hyperlipidemia, prior DVT on warfarin, and diabetes type 2 who presents to establish care.    Interval history 07/25/15:  At her last appointment lasix was increased due to lower extremity edema.  She was also started on Imdur.  BNP was not elevated so she was instructed to reduce lasix back to 40 mg bid.  She continues to have mild lower extremity edema, though it is improved from prior.She denies any orthopnea or PND.  Her breathing has been stable and she denies any chest pain.  [name] has been dealing with a cold for 3 weeks.  She now has bronchitis and was recently started on antibiotics.  Her PCP noted that she is anemic and her vitamin D is low.  She is now taking Vitamin D and iron supplementation.    Interval history 06/04/15: [name] underwent cardiac catheterization on 11/21 that revealed a 50% LAD lesion.  There was concern that there may have been a component of vasospasm so long-acting nitrates were suggested.  She resumed warfarin post catheterization.  At her last appointment she was started on carvedilol for hypertension and diastolic dysfunction and pravastatin was resumed at low dose.  He reports that she continues to have episodes of chest discomfort that do not last very long.They are associated with shortness of breath. She also notes shortness of breath with minimal exertion and was singing in the choir at church.She feels that her breathing is worse as the weather gets colder.She reports a history of asthma and used to get relief with  an albuterol inhaler, though she does not have anymore this prescription.[name] reports lower extremity edema that does not improve with elevation each morning She takes her Lasix as prescribed most days, but sometimes doesn't take her morning dose if she has to go out for the day.  She denies orthopnea or PND, though she sleeps with the head of her bed elevated for gastroesophageal reflux disease.  Ms. Jann has tolerated the increased dose of pravastatin without complications.  Interval history 05/20/15:  [name] had an exercise nuclear stress test that was equivocal. It was likely negative with breast attenuation artifact. It did show that her blood pressure was poorly controlled, as she had a hypertensive response to exercise with systolic blood pressure up to 209 mmHg. She denies any chest pain.  She does have pain in her right breast.  This can occur both with exertion at with rest.  It is not associated with shortness of breath, nausea, vomiting, lightheadedness, or diaphoresis.  She notices it every time she tries to sweep a floor.  She is unable to do more vigorous activity due to fatigue that has been present ever since she had rheumatic fever at age 60. At her last appointment [name] increased her pravastatin to 40 mg from 10 mg because her lipids were not at goal.  After taking this dose for several days she noted severe body aches and chills. She felt like she  had the flu. She decreased the dose back to 10 mg and felt okay. She has noted some lower extremity edema but she thinks this is because she's not taking her diuretics in the last 2 days. She recently went on a car trip and was afraid of taking it because she would not have easy access to the bathroom. She denies orthopnea or PND.   History of Present Illness 04/16/15:  [name] was previously a patient of Dr. Sallyanne Kuster but has not been seen lately.  She neglected her health because she was caring for husband who was on  hemodialysis for 14 months.  He recently had a kidney transplant earlier this year.    [name] has been feeling "so-so."   She has been following up with her Nephrologist who recommended that she see a cardiologist.  He recommended that her losartan be cut in half due to her renal function.  Ms. Goetze notes occasional chest pressure that improves with belching.  Sometimes it does not go completely away.  The episodes occur sporadically and are not always associated with exertion.  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  She tries to park in the farthest parking spot and does a lot of walking while she is there. She denies any CP or SOB with ambulation.  She notices pain in her legs when laying down.  She also reports R calf pain and at times her foot feels very cold.    She also reports LE edema that improves with elevation.  She denies orthopnea or PND  She reports frequent dizziness that she improves with antivert. She had one episode of severe vertigo that didn't improve with antivert.   Past Medical History  Diagnosis Date  . Hypertension   . Diabetes mellitus     x 5 yrs  . Hypothyroidism   . DVT of axillary vein, acute left 07/24/12  . Back pain   . Vertigo     chonic  . Mixed hyperlipidemia   . GERD (gastroesophageal reflux disease)   . Glaucoma   . Sigmoid diverticulitis   . Asthmatic bronchitis   . Pinched nerve     right elbow  . Chronic kidney disease     kidney function low  . Antral gastritis     EGD 11/15  . Kidney stones   . Peripheral venous insufficiency   . Chronic diastolic heart failure (HCC) 05/20/2015    Grade 2 diastolic dysfunction.  04/2015.  [name] Hyperlipidemia 05/20/2015    Past Surgical History  Procedure Laterality Date  . Other surgical history      colostomy, colostomy reversal, for diverticulitis surgical hernia repair, arm surgery, neck surgery  . Abdominal hysterectomy    . Fracture left foot    .  Eye surgery    . Back surgery      spinal   . Cholecystectomy    . Colon surgery    . Hernia repair    . Colostomy closure    . Colonoscopy N/A 12/27/2013    Procedure: COLONOSCOPY;  Surgeon: Rogene Houston, MD;  Location: [facility];  Service: Endoscopy;  Laterality: N/A;  200  . Esophagogastroduodenoscopy N/A 05/07/2014    Procedure: ESOPHAGOGASTRODUODENOSCOPY (EGD);  Surgeon: Rogene Houston, MD;  Location: [facility];  Service: Endoscopy;  Laterality: N/A;  . US echocardiography  02/11/2010    Mild MR,trace TR & AI  . Nm myocar perf wall motion  01/28/2009    Normal  . Cardiac catheterization N/A 05/26/2015    Procedure: Left Heart Cath and Coronary Angiography;  Surgeon: Jettie Booze, MD;  Location: [facility];  Service: Cardiovascular;  Laterality: N/A;     Current Outpatient Prescriptions  Medication Sig Dispense Refill  . albuterol (PROVENTIL HFA;VENTOLIN HFA) 108 (90 BASE) MCG/ACT inhaler Inhale 2 puffs into the lungs every 4 (four) hours as needed for shortness of breath. 1 Inhaler 0  . allopurinol (ZYLOPRIM) 100 MG tablet Take 100 mg by mouth daily as needed.     [name] amitriptyline (ELAVIL) 25 MG tablet Take 50 mg by mouth at bedtime.     [name] amoxicillin-clavulanate (AUGMENTIN) 500-125 MG tablet Take 1 tablet by mouth 2 (two) times daily. Take 1 tab bid for 7 days    . Black Cohosh 40 MG CAPS Take 40 mg by mouth 2 (two) times daily.    . carvedilol (COREG) 6.25 MG tablet Take 1 tablet (6.25 mg total) by mouth 2 (two) times daily. 180 tablet 3  . cetirizine (ZYRTEC) 10 MG tablet Take 10 mg by mouth daily.    . Cholecalciferol (VITAMIN D-3) 1000 units CAPS Take 1 capsule by mouth daily.    [name] esomeprazole (NEXIUM) 20 MG capsule Take 20 mg by mouth daily at 12 noon.    . furosemide (LASIX) 40 MG tablet Take 40 mg by mouth 2 (two) times daily.    . iron polysaccharides (NU-IRON) 150 MG capsule Take 150 mg by mouth daily.    . isosorbide mononitrate (IMDUR) 30 MG  24 hr tablet Take 1 tablet (30 mg total) by mouth daily. 90 tablet 3  . latanoprost (XALATAN) 0.005 % ophthalmic solution Place 1 drop into both eyes at bedtime.     [name] losartan (COZAAR) 50 MG tablet Take 25 mg by mouth daily.    . meclizine (ANTIVERT) 25 MG tablet Take 25 mg by mouth as needed for dizziness.     . Omega-3 Fatty Acids (FISH OIL) 1200 MG CAPS Take 1 capsule by mouth 2 (two) times daily.    . polyethylene glycol (MIRALAX / GLYCOLAX) packet Take 17 g by mouth daily.    . potassium chloride SA (K-DUR,KLOR-CON) 20 MEQ tablet Take 10 mEq by mouth daily.     . traMADol (ULTRAM) 50 MG tablet Take 50 mg by mouth every 8 (eight) hours as needed. for pain  2  . warfarin (COUMADIN) 1 MG tablet Take 1 mg by mouth daily.  11  . pravastatin (PRAVACHOL) 20 MG tablet Take 1 tablet by mouth 2 (two) times daily. Take half tab bid     No current facility-administered medications for this visit.    Allergies:   Vioxx; Penicillins; Codeine; Motrin; and Sulfur    Social History:  The patient  reports that she quit smoking about 19 months ago. Her smoking use included Cigarettes. She has never used smokeless tobacco. She reports that she does not drink alcohol or use illicit drugs.   Family History:  The patient's family history includes CVA (age of onset: 5) in her maternal grandmother; Cancer in her mother; Cancer (age of onset: 47) in her sister; Heart attack (age of onset: 45) in her brother; Heart disease in her maternal grandmother; Other (age of onset: 62) in her mother; Stroke in her maternal grandmother.    ROS:  Please see the history of present illness.   Otherwise, review of systems are positive for productive cough.  All other systems are reviewed and negative.    PHYSICAL EXAM: VS:  BP 126/70 mmHg  Pulse 100  Ht 5' 2.25" (1.581 m)  Wt 80.939 kg (178 lb 7 oz)  BMI 32.38 kg/m2 , BMI Body mass index is 32.38 kg/(m^2). GENERAL:  Ill and tired appearing HEENT:  Pupils equal round  and reactive, fundi not visualized, oral mucosa unremarkable NECK:  No jugular venous distention, waveform within normal limits, carotid upstroke brisk and symmetric, no bruits, no thyromegaly LYMPHATICS:  No cervical adenopathy LUNGS:  Clear to auscultation bilaterally HEART:  Tachycardic, regular rhythm.  PMI not displaced or sustained,S1 and S2 within normal limits, no S3, no S4, no clicks, no rubs, no murmurs ABD:  Flat, positive bowel sounds normal in frequency in pitch, no bruits, no rebound, no guarding, no midline pulsatile mass, no hepatomegaly, no splenomegaly EXT:  2 plus radial pulses, 1+ DP/PT bilaterally, trace LE edema to the mid tibia bilaterally, no cyanosis no clubbing SKIN:  No rashes no nodules.  Erythema of bilateral anterior tibia.  Not warm to touch.   NEURO:  Cranial nerves II through XII grossly intact, motor grossly intact throughout PSYCH:  Cognitively intact, oriented to person place and time   EKG:  EKG is not ordered today.  Exercise Myoview 04/30/15  The left ventricular ejection fraction is hyperdynamic (>65%).  Nuclear stress EF: 78%.  Blood pressure demonstrated a hypertensive response to exercise.  Upsloping ST segment depression ST segment depression was noted during stress in the III, II and aVF leads. This is not diagnostic for ischemia.  Defect 1: There is a small defect of moderate severity present in the mid anterior and apical anterior location. Wall motion is normal, so this is likely artifact due to breast attenuation. However, cannot rule out LAD ischemia.  This is a low risk study.  Cardiac cath 05/26/15:  Mid LAD lesion, 50% stenosed. FFR of this lesion showed value of 0.85. This is felt to be nonsignificant.  Normal LVEDP.  Continue aggressive medical therapy. Would consider adding long-acting nitrate as the patient may have some component of vasospasm. Of note, in the future , if repeat catheterization was needed , would consider  using a 5 [place] guide catheter as her left main is very short and the catheter tends to deep seat Into either the LAD or the circumflex.  Recent Labs: 04/17/2015: ALT 23; BNP 44.3 05/20/2015: BUN 14; Creatinine, Ser 0.98; Platelets 271; Potassium 4.4; Sodium 147*; TSH 1.300   03/12/15: INR 2.9   Lipid Panel    Component Value Date/Time   CHOL 237* 04/17/2015 1010   TRIG 166* 04/17/2015 1010   HDL 43 04/17/2015 1010   LDLCALC 161* 04/17/2015 1010      Wt Readings from Last 3 Encounters:  07/25/15 80.939 kg (178 lb 7 oz)  06/04/15 79.697 kg (175 lb 11.2 oz)  05/26/15 77.111 kg (170 lb)      ASSESSMENT AND PLAN:  # Non-obstructive CAD: Non-obstructive coronary disease on cath. However there was some concern for coronary spasms. She is doing well on isosorbide mononitrate 30 mg daily.  She will continue carvedilol and pravastatin.  # Grade 2 diastolic dysfunction/SOB/edema: Ms. Tutwiler has lower extremity edema hat has improved. He attributes this to being home with her legs elevated lately. He does not wear compression stockings as she states she is unable to get them on.  Given that her BNP was 27 at her last appointment when her legs had more edema,  it seems unlikely that her heart failure is the cause of her swelling. She may also have venous insufficiency. I recommended that she try to wear compression stockings, elevate her legs, and follow-up with the venous vascular specialist.  Her breathing is improved.  # Hypertension: BP remains well controlled on losartan and carvedilol.  # Hyperlipidemia:  Ms. Balliet has been able to tolerate Pravastatin 20 mg.  She did not tolerate higher doses, nor did she tolerate rosuvastatin.   Current medicines are reviewed at length with the patient today.  The patient does not have concerns regarding medicines.  The following changes have been made:  no change  Labs/ tests ordered today include:   No orders of the defined types were  placed in this encounter.     Disposition:   FU with Aquila Menzie C. Oval Linsey, MD in 6 months    Signed, Sharol Harness, MD  07/25/2015 10:43 AM    [place]

## 2015-07-25 ENCOUNTER — Ambulatory Visit (INDEPENDENT_AMBULATORY_CARE_PROVIDER_SITE_OTHER): Payer: Medicare Other | Admitting: Cardiovascular Disease

## 2015-07-25 ENCOUNTER — Encounter: Payer: Self-pay | Admitting: Cardiovascular Disease

## 2015-07-25 VITALS — BP 126/70 | HR 100 | Ht 62.25 in | Wt 178.4 lb

## 2015-07-25 DIAGNOSIS — R600 Localized edema: Secondary | ICD-10-CM

## 2015-07-25 DIAGNOSIS — I2510 Atherosclerotic heart disease of native coronary artery without angina pectoris: Secondary | ICD-10-CM | POA: Diagnosis not present

## 2015-07-25 DIAGNOSIS — I10 Essential (primary) hypertension: Secondary | ICD-10-CM | POA: Diagnosis not present

## 2015-07-25 DIAGNOSIS — E785 Hyperlipidemia, unspecified: Secondary | ICD-10-CM | POA: Diagnosis not present

## 2015-07-25 DIAGNOSIS — I2583 Coronary atherosclerosis due to lipid rich plaque: Secondary | ICD-10-CM

## 2015-07-25 DIAGNOSIS — I5032 Chronic diastolic (congestive) heart failure: Secondary | ICD-10-CM

## 2015-07-25 NOTE — Patient Instructions (Signed)
Dr Grandview recommends that you schedule a follow-up appointment in 6 months. You will receive a reminder letter in the mail two months in advance. If you don't receive a letter, please call our office to schedule the follow-up appointment.  If you need a refill on your cardiac medications before your next appointment, please call your pharmacy. 

## 2015-07-28 DIAGNOSIS — Z7901 Long term (current) use of anticoagulants: Secondary | ICD-10-CM | POA: Diagnosis not present

## 2015-08-01 DIAGNOSIS — Z7901 Long term (current) use of anticoagulants: Secondary | ICD-10-CM | POA: Diagnosis not present

## 2015-08-12 DIAGNOSIS — Z7901 Long term (current) use of anticoagulants: Secondary | ICD-10-CM | POA: Diagnosis not present

## 2015-09-03 DIAGNOSIS — Z7901 Long term (current) use of anticoagulants: Secondary | ICD-10-CM | POA: Diagnosis not present

## 2015-09-27 DIAGNOSIS — E119 Type 2 diabetes mellitus without complications: Secondary | ICD-10-CM | POA: Diagnosis not present

## 2015-11-27 DIAGNOSIS — E559 Vitamin D deficiency, unspecified: Secondary | ICD-10-CM | POA: Diagnosis not present

## 2015-11-27 DIAGNOSIS — R809 Proteinuria, unspecified: Secondary | ICD-10-CM | POA: Diagnosis not present

## 2015-11-27 DIAGNOSIS — D649 Anemia, unspecified: Secondary | ICD-10-CM | POA: Diagnosis not present

## 2015-11-27 DIAGNOSIS — I10 Essential (primary) hypertension: Secondary | ICD-10-CM | POA: Diagnosis not present

## 2015-11-27 DIAGNOSIS — Z79899 Other long term (current) drug therapy: Secondary | ICD-10-CM | POA: Diagnosis not present

## 2015-11-27 DIAGNOSIS — N183 Chronic kidney disease, stage 3 (moderate): Secondary | ICD-10-CM | POA: Diagnosis not present

## 2015-11-28 DIAGNOSIS — Z7901 Long term (current) use of anticoagulants: Secondary | ICD-10-CM | POA: Diagnosis not present

## 2015-12-04 ENCOUNTER — Other Ambulatory Visit (HOSPITAL_COMMUNITY): Payer: Self-pay | Admitting: Family Medicine

## 2015-12-04 DIAGNOSIS — Z1389 Encounter for screening for other disorder: Secondary | ICD-10-CM | POA: Diagnosis not present

## 2015-12-04 DIAGNOSIS — E782 Mixed hyperlipidemia: Secondary | ICD-10-CM | POA: Diagnosis not present

## 2015-12-04 DIAGNOSIS — E119 Type 2 diabetes mellitus without complications: Secondary | ICD-10-CM | POA: Diagnosis not present

## 2015-12-04 DIAGNOSIS — Z23 Encounter for immunization: Secondary | ICD-10-CM | POA: Diagnosis not present

## 2015-12-04 DIAGNOSIS — I10 Essential (primary) hypertension: Secondary | ICD-10-CM | POA: Diagnosis not present

## 2015-12-04 DIAGNOSIS — Z7901 Long term (current) use of anticoagulants: Secondary | ICD-10-CM | POA: Diagnosis not present

## 2015-12-09 DIAGNOSIS — M79641 Pain in right hand: Secondary | ICD-10-CM | POA: Diagnosis not present

## 2015-12-09 DIAGNOSIS — E119 Type 2 diabetes mellitus without complications: Secondary | ICD-10-CM | POA: Diagnosis not present

## 2015-12-09 DIAGNOSIS — H401131 Primary open-angle glaucoma, bilateral, mild stage: Secondary | ICD-10-CM | POA: Diagnosis not present

## 2015-12-27 DIAGNOSIS — E119 Type 2 diabetes mellitus without complications: Secondary | ICD-10-CM | POA: Diagnosis not present

## 2016-01-11 IMAGING — DX DG CHEST 2V
2 series · 2 of 2 positions shown · non-contrast
Comparison: 05/03/2014

CLINICAL DATA: Preoperative for cardiac catheterization.
Hypertension.

EXAM:
CHEST  2 VIEW

[chest pa]
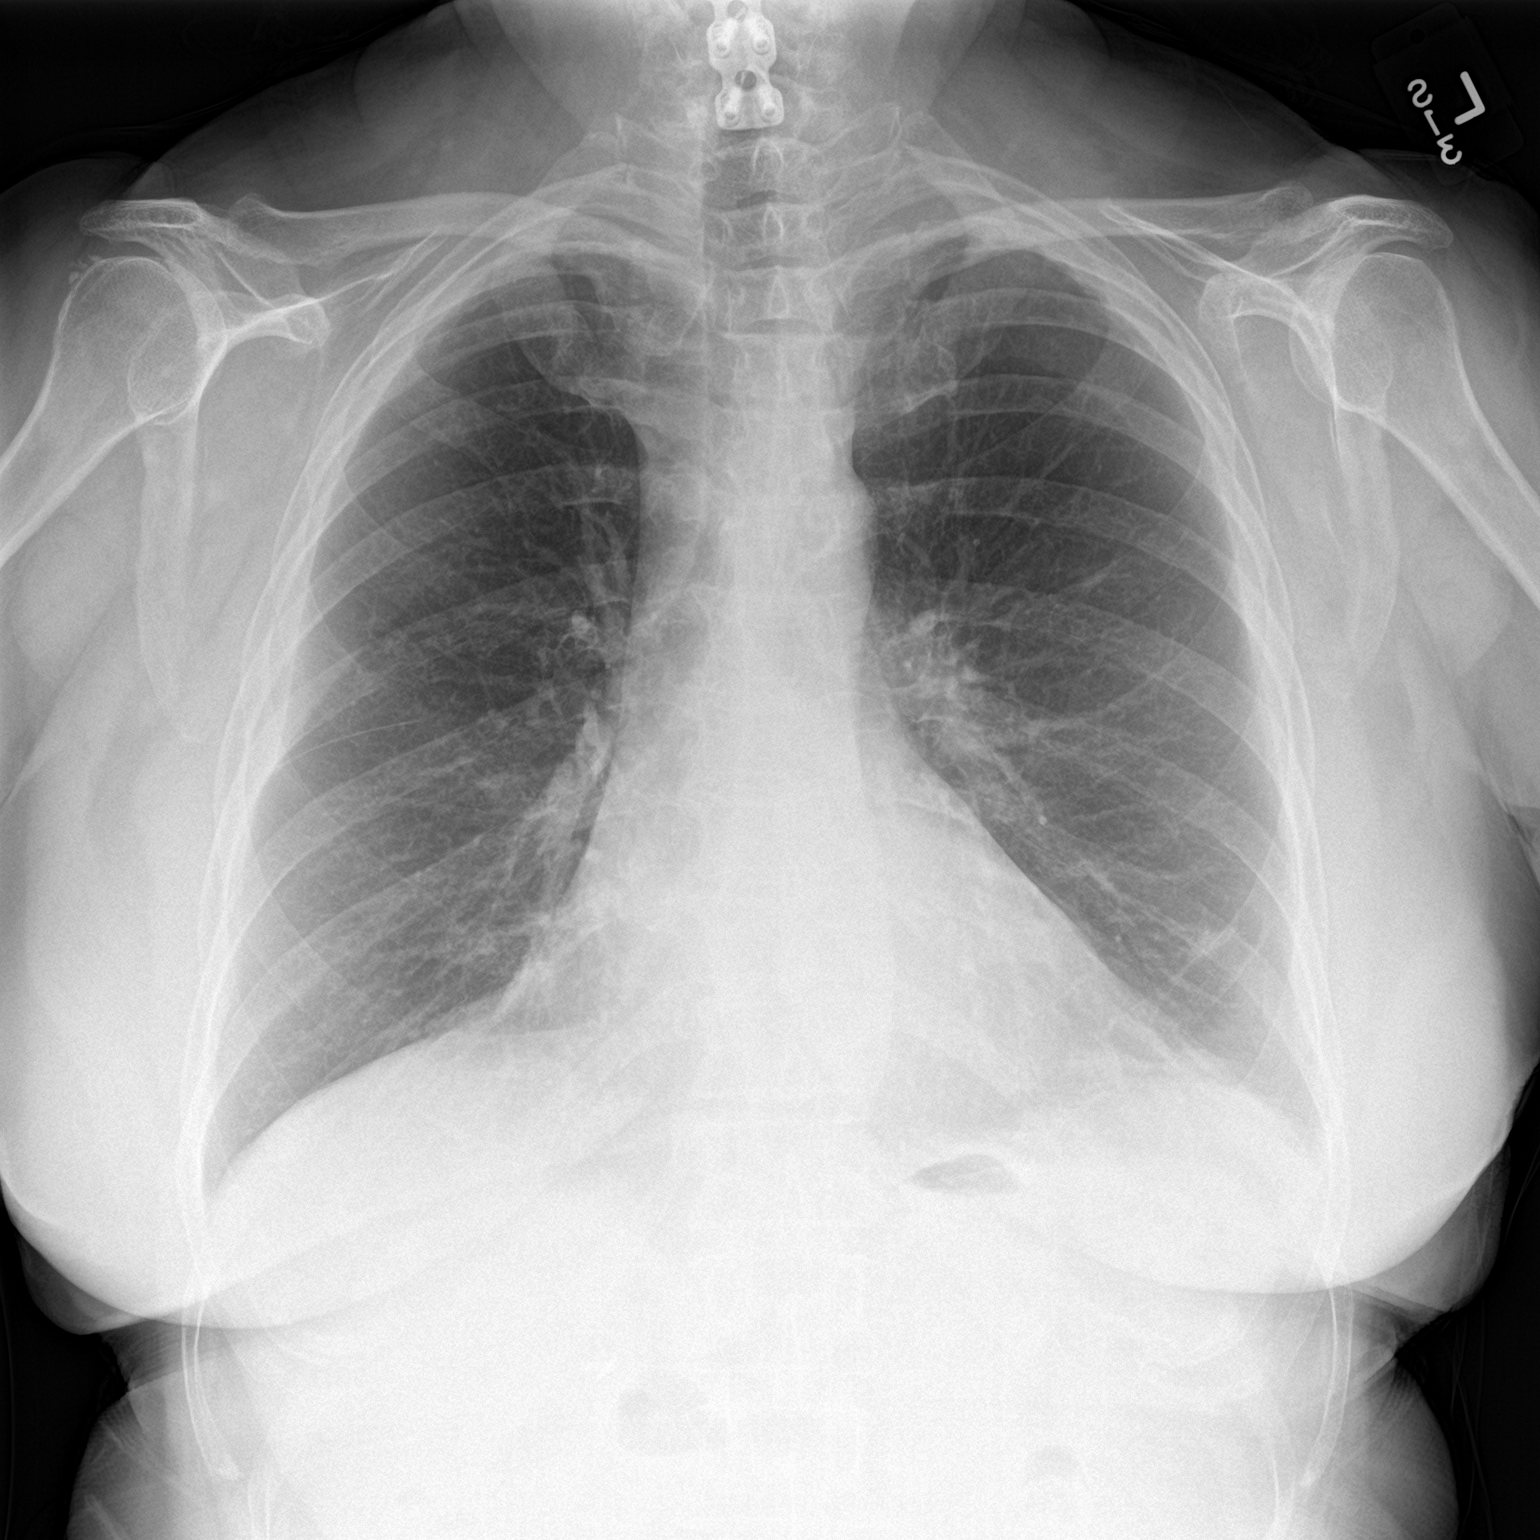

[chest lat]
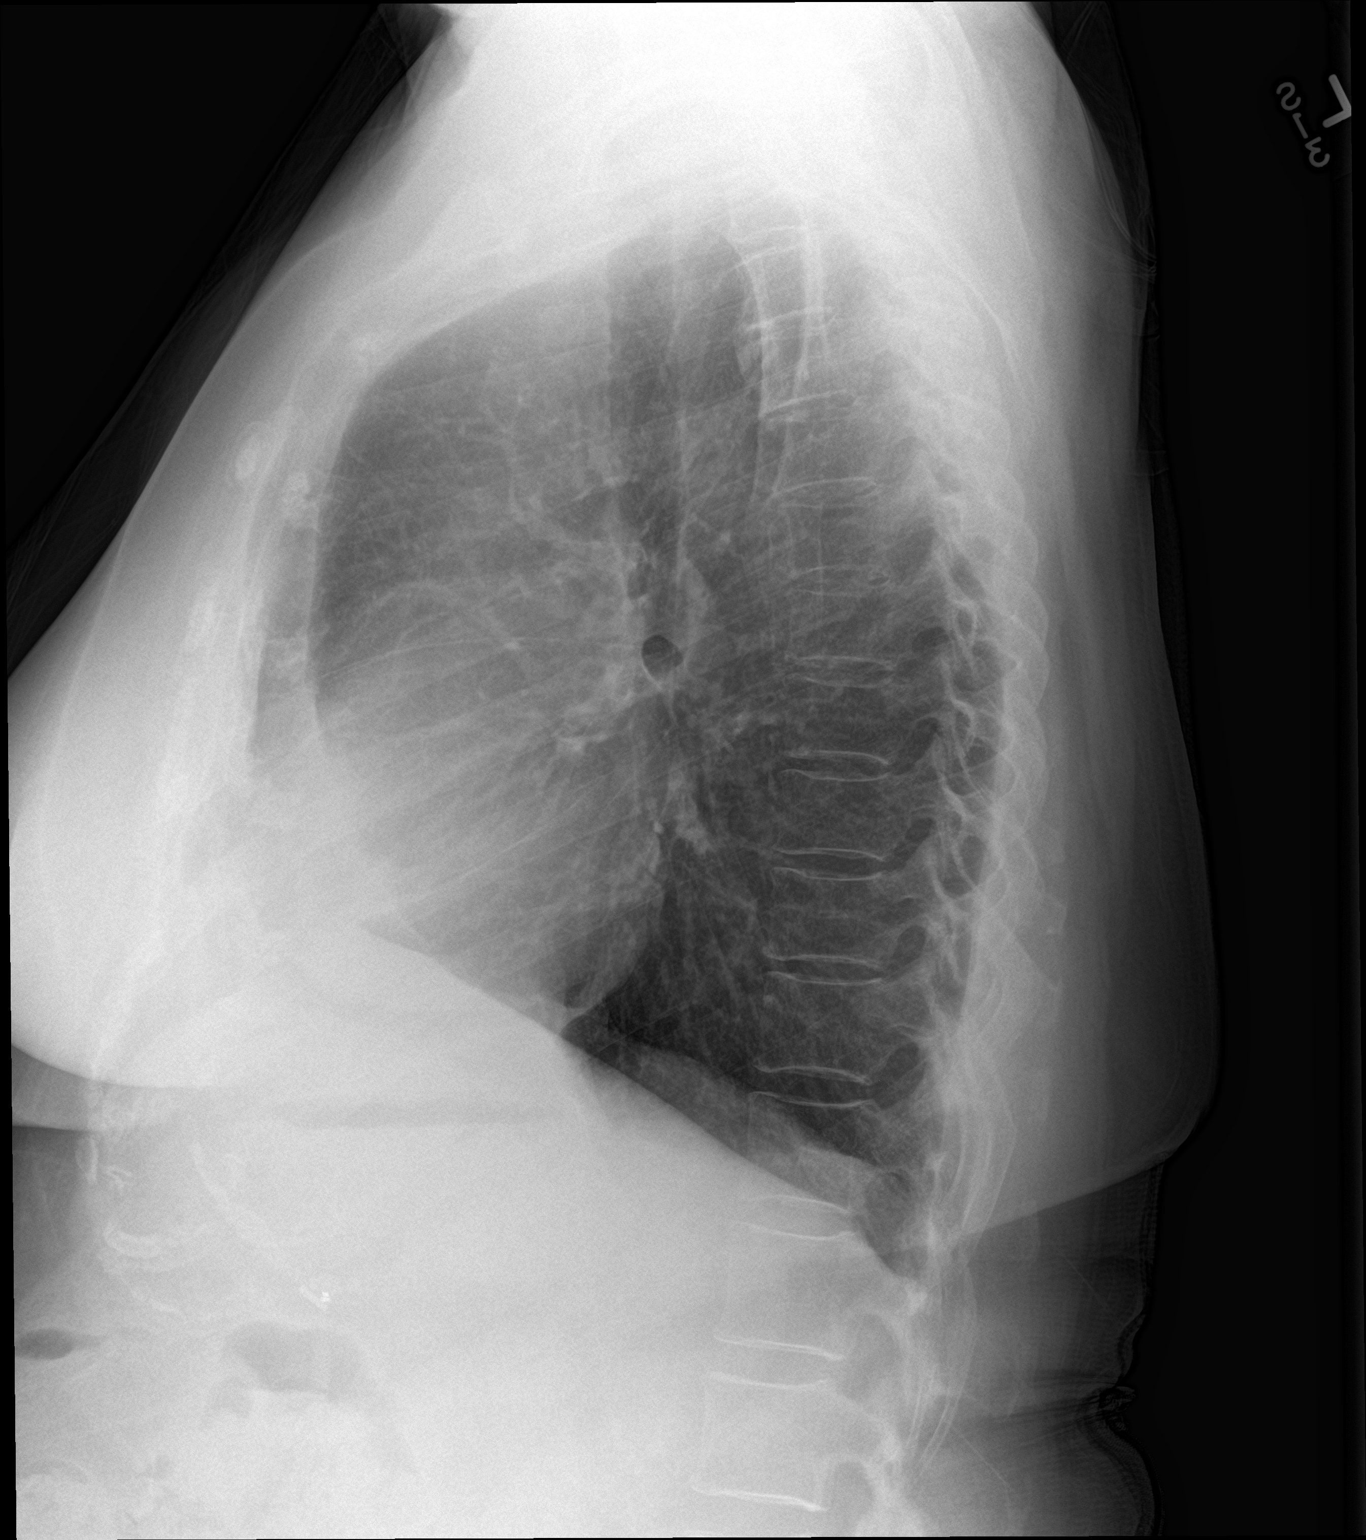

[2 of 2 positions shown; findings below may reference images not displayed]

FINDINGS: Linear scarring or subsegmental atelectasis in the lingula, not
changed from 05/03/2014.

Lower cervical plate and screw fixator.

Prominent epicardial adipose tissue, similar to prior CT.

No pleural effusion.  The lungs appear otherwise clear.

Suspected calcific tendinopathy in the right rotator cuff.
IMPRESSION: 1. Mild left basilar subsegmental atelectasis or scarring, primarily
in the lingula.
2. Suspected right rotator cuff calcific tendinopathy.

## 2016-01-12 DIAGNOSIS — I10 Essential (primary) hypertension: Secondary | ICD-10-CM | POA: Diagnosis not present

## 2016-01-12 DIAGNOSIS — M1990 Unspecified osteoarthritis, unspecified site: Secondary | ICD-10-CM | POA: Diagnosis not present

## 2016-01-12 DIAGNOSIS — G894 Chronic pain syndrome: Secondary | ICD-10-CM | POA: Diagnosis not present

## 2016-01-12 DIAGNOSIS — M545 Low back pain: Secondary | ICD-10-CM | POA: Diagnosis not present

## 2016-02-02 DIAGNOSIS — Z7901 Long term (current) use of anticoagulants: Secondary | ICD-10-CM | POA: Diagnosis not present

## 2016-02-16 ENCOUNTER — Ambulatory Visit (INDEPENDENT_AMBULATORY_CARE_PROVIDER_SITE_OTHER): Payer: Medicare Other | Admitting: Cardiovascular Disease

## 2016-02-16 ENCOUNTER — Encounter: Payer: Self-pay | Admitting: Cardiovascular Disease

## 2016-02-16 ENCOUNTER — Encounter (INDEPENDENT_AMBULATORY_CARE_PROVIDER_SITE_OTHER): Payer: Self-pay

## 2016-02-16 VITALS — BP 140/76 | HR 83 | Ht 62.5 in | Wt 185.8 lb

## 2016-02-16 DIAGNOSIS — E785 Hyperlipidemia, unspecified: Secondary | ICD-10-CM | POA: Diagnosis not present

## 2016-02-16 DIAGNOSIS — I2583 Coronary atherosclerosis due to lipid rich plaque: Secondary | ICD-10-CM

## 2016-02-16 DIAGNOSIS — I5033 Acute on chronic diastolic (congestive) heart failure: Secondary | ICD-10-CM

## 2016-02-16 DIAGNOSIS — I2510 Atherosclerotic heart disease of native coronary artery without angina pectoris: Secondary | ICD-10-CM

## 2016-02-16 DIAGNOSIS — I10 Essential (primary) hypertension: Secondary | ICD-10-CM | POA: Diagnosis not present

## 2016-02-16 NOTE — Patient Instructions (Signed)
Medication Instructions:  INCREASE YOUR LASIX (FUROSEMIDE) TO 40 MG TWICE A DAY FOR TWO DAYS AND THEN BACK TO ONCE A DAY  Labwork: FASTING LP/CMET AT [facility]  Testing/Procedures: NONE  Follow-Up: Your physician wants you to follow-up in: [place] will receive a reminder letter in the mail two months in advance. If you don't receive a letter, please call our office to schedule the follow-up appointment.  If you need a refill on your cardiac medications before your next appointment, please call your pharmacy.

## 2016-02-16 NOTE — Progress Notes (Signed)
Cardiology Office Note   Date:  02/18/2016   ID:  [name], DOB Feb 21, 1941, MRN [id number]  PCP:  Purvis Kilts, MD  Cardiologist:   Skeet Latch, MD   Chief Complaint  Patient presents with  . 6 month f/u    pt c/o SOB on exertion and swelling/redness in legs/feet/ankles      History of Present Illness: [name] is a 75 y.o. female with chronic diastolic heart failure (grade 2), hypertension, hyperlipidemia, prior DVT on warfarin, and diabetes type 2 who presents for follow up.  She was first seen 04/2015 at which time she reported occasional chest pain.  She had an exercise Myoview 04/2015 that showed LVEF 78% with a small defect of moderate severity in the mid anterior and apical anterior region.  This was felt to be due to breast attenuation artifact.  However, she underwent cardiac catheterization on 05/26/15 that revealed a 50% LAD lesion. There was concern that there may be a component of vasospasm so long-acting nitrates were started. Her blood pressure and hyperlipidemia medications have been titrated due to poor control.  She is also on Lasix due to lower extremity edema.  Since her last visit [name] has been doing well.  She went on a trip to [place] and did a lot of walking.  She felt well be has noticed increased lower extremity edema.  She did not take her lasix during that time because she wasn't near a bathroom.  She is unable to wear compression stockings because she can't get them on.  She denies chest pain, orthopnea or PND.  She does not weigh herself regularly.  [name] reports some episodes of sharp pain that occur in her right breast.  These episodes last for seconds and only at night.  She denies exertional symptoms.    Past Medical History:  Diagnosis Date  . Antral gastritis    EGD 11/15  . Asthmatic bronchitis   . Back pain   . Chronic diastolic heart failure (HCC) 05/20/2015   Grade 2 diastolic dysfunction.  04/2015.    [name] Chronic kidney disease    kidney function low  . Diabetes mellitus    x 5 yrs  . DVT of axillary vein, acute left 07/24/12  . GERD (gastroesophageal reflux disease)   . Glaucoma   . Hyperlipidemia 05/20/2015  . Hypertension   . Hypothyroidism   . Kidney stones   . Mixed hyperlipidemia   . Peripheral venous insufficiency   . Pinched nerve    right elbow  . Sigmoid diverticulitis   . Vertigo    chonic    Past Surgical History:  Procedure Laterality Date  . ABDOMINAL HYSTERECTOMY    . BACK SURGERY     spinal   . CARDIAC CATHETERIZATION N/A 05/26/2015   Procedure: Left Heart Cath and Coronary Angiography;  Surgeon: Jettie Booze, MD;  Location: [place] [facility];  Service: Cardiovascular;  Laterality: N/A;  . CHOLECYSTECTOMY    . COLON SURGERY    . COLONOSCOPY N/A 12/27/2013   Procedure: COLONOSCOPY;  Surgeon: Rogene Houston, MD;  [facility];  Service: Endoscopy;  Laterality: N/A;  200  . COLOSTOMY CLOSURE    . ESOPHAGOGASTRODUODENOSCOPY N/A 05/07/2014   Procedure: ESOPHAGOGASTRODUODENOSCOPY (EGD);  Surgeon: Rogene Houston, MD;  [facility];  Service: Endoscopy;  Laterality: N/A;  . EYE SURGERY    . fracture left foot    . HERNIA REPAIR    .  NM MYOCAR PERF WALL MOTION  01/28/2009   Normal  . OTHER SURGICAL HISTORY     colostomy, colostomy reversal, for diverticulitis surgical hernia repair, arm surgery, neck surgery  . US ECHOCARDIOGRAPHY  02/11/2010   Mild MR,trace TR & AI     Current Outpatient Prescriptions  Medication Sig Dispense Refill  . albuterol (PROVENTIL HFA;VENTOLIN HFA) 108 (90 BASE) MCG/ACT inhaler Inhale 2 puffs into the lungs every 4 (four) hours as needed for shortness of breath. 1 Inhaler 0  . allopurinol (ZYLOPRIM) 100 MG tablet Take 100 mg by mouth daily as needed.     [name] amitriptyline (ELAVIL) 25 MG tablet Take 50 mg by mouth at bedtime.     . Black Cohosh 40 MG CAPS Take 40 mg by mouth 2 (two) times daily.    .  carvedilol (COREG) 6.25 MG tablet Take 1 tablet (6.25 mg total) by mouth 2 (two) times daily. 180 tablet 3  . cetirizine (ZYRTEC) 10 MG tablet Take 10 mg by mouth daily.    . Cholecalciferol (VITAMIN D-3) 1000 units CAPS Take 1 capsule by mouth daily.    [name] esomeprazole (NEXIUM) 20 MG capsule Take 20 mg by mouth daily at 12 noon.    . furosemide (LASIX) 40 MG tablet Take 40 mg by mouth daily.     . iron polysaccharides (NU-IRON) 150 MG capsule Take 150 mg by mouth daily.    . isosorbide mononitrate (IMDUR) 30 MG 24 hr tablet Take 1 tablet (30 mg total) by mouth daily. 90 tablet 3  . latanoprost (XALATAN) 0.005 % ophthalmic solution Place 1 drop into both eyes at bedtime.     [name] losartan (COZAAR) 50 MG tablet Take 25 mg by mouth daily.    . meclizine (ANTIVERT) 25 MG tablet Take 25 mg by mouth as needed for dizziness.     . Omega-3 Fatty Acids (FISH OIL) 1200 MG CAPS Take 1 capsule by mouth 2 (two) times daily.    . polyethylene glycol (MIRALAX / GLYCOLAX) packet Take 17 g by mouth daily.    . potassium chloride SA (K-DUR,KLOR-CON) 20 MEQ tablet Take 10 mEq by mouth daily.     . pravastatin (PRAVACHOL) 20 MG tablet Take 1 tablet by mouth 2 (two) times daily. Take half tab bid    . warfarin (COUMADIN) 1 MG tablet Take 1 mg by mouth daily.  11   No current facility-administered medications for this visit.     Allergies:   Vioxx [rofecoxib]; Penicillins; Codeine; Motrin [ibuprofen]; and Sulfur    Social History:  The patient  reports that she quit smoking about 2 years ago. Her smoking use included Cigarettes. She has never used smokeless tobacco. She reports that she does not drink alcohol or use drugs.   Family History:  The patient's family history includes CVA (age of onset: 66) in her maternal grandmother; Cancer in her mother; Cancer (age of onset: 24) in her sister; Heart attack (age of onset: 34) in her brother; Heart disease in her maternal grandmother; Other (age of onset: 37) in her  mother; Stroke in her maternal grandmother.    ROS:  Please see the history of present illness.   Otherwise, review of systems are positive for productive cough.   All other systems are reviewed and negative.    PHYSICAL EXAM: VS:  BP 140/76 (BP Location: Left Arm, Patient Position: Sitting, Cuff Size: Normal)   Pulse 83   Ht 5' 2.5" (1.588 m)   Wt 185  lb 12.8 oz (84.3 kg)   BMI 33.44 kg/m  , BMI Body mass index is 33.44 kg/m. GENERAL:  Ill and tired appearing HEENT:  Pupils equal round and reactive, fundi not visualized, oral mucosa unremarkable NECK:  JVP 2 cm above clavicle at 45 degrees waveform within normal limits, carotid upstroke brisk and symmetric, no bruits, no thyromegaly LYMPHATICS:  No cervical adenopathy LUNGS:  Clear to auscultation bilaterally HEART:  Tachycardic, regular rhythm.  PMI not displaced or sustained,S1 and S2 within normal limits, no S3, no S4, no clicks, no rubs, no murmurs ABD:  Flat, positive bowel sounds normal in frequency in pitch, no bruits, no rebound, no guarding, no midline pulsatile mass, no hepatomegaly, no splenomegaly EXT:  2 plus radial pulses, 1+ DP/PT bilaterally, 2+ pitting LE edema to the mid tibia bilaterally, no cyanosis no clubbing SKIN:  No rashes no nodules.  Erythema of bilateral anterior tibia.  Not warm to touch.   NEURO:  Cranial nerves II through XII grossly intact, motor grossly intact throughout PSYCH:  Cognitively intact, oriented to person place and time   EKG:  EKG is ordered today. 02/16/16: Sinus rhythm rate 83 bpm.  PAC.  RBBB.  LPFB.  Exercise Myoview 04/30/15  The left ventricular ejection fraction is hyperdynamic (>65%).  Nuclear stress EF: 78%.  Blood pressure demonstrated a hypertensive response to exercise.  Upsloping ST segment depression ST segment depression was noted during stress in the III, II and aVF leads. This is not diagnostic for ischemia.  Defect 1: There is a small defect of moderate severity  present in the mid anterior and apical anterior location. Wall motion is normal, so this is likely artifact due to breast attenuation. However, cannot rule out LAD ischemia.  This is a low risk study.  Cardiac cath 05/26/15:  Mid LAD lesion, 50% stenosed. FFR of this lesion showed value of 0.85. This is felt to be nonsignificant.  Normal LVEDP.  Continue aggressive medical therapy. Would consider adding long-acting nitrate as the patient may have some component of vasospasm. Of note, in the future , if repeat catheterization was needed , would consider using a 5 [place] guide catheter as her left main is very short and the catheter tends to deep seat Into either the LAD or the circumflex.  Recent Labs: 04/17/2015: ALT 23 05/20/2015: BUN 14; Creatinine, Ser 0.98; Platelets 271; Potassium 4.4; Sodium 147; TSH 1.300 06/04/2015: Brain Natriuretic Peptide 27.1   03/12/15: INR 2.9   Lipid Panel    Component Value Date/Time   CHOL 237 (H) 04/17/2015 1010   TRIG 166 (H) 04/17/2015 1010   HDL 43 04/17/2015 1010   LDLCALC 161 (H) 04/17/2015 1010      Wt Readings from Last 3 Encounters:  02/16/16 185 lb 12.8 oz (84.3 kg)  07/25/15 178 lb 7 oz (80.9 kg)  06/04/15 175 lb 11.2 oz (79.7 kg)      ASSESSMENT AND PLAN:  # Non-obstructive CAD: Non-obstructive coronary disease on cath. However there was some concern for coronary spasms. She is doing well on isosorbide mononitrate 30 mg daily.  She will continue carvedilol and pravastatin.  # Acute on chronic diastolic heart failure: Ms. Laue has lower extremity edema that was better on lasix.  However, she hasn't been taking it while travelling and her weight is up 8 lb.  She also has lower extremity edema and JVD is mildly elevated.  I have asked her to take lasix twice daily instead of daily for 3 days.  Continue carvedilol, Imdur, and losartan.  Increase lasix  To 40 mg bid for 2 days  # Hypertension: BP remains well controlled on  losartan and carvedilol.  # Hyperlipidemia:  Ms. Aceituno has been able to tolerate Pravastatin 20 mg.  She did not tolerate higher doses, nor did she tolerate rosuvastatin.  We will check lipids and CMP.   Current medicines are reviewed at length with the patient today.  The patient does not have concerns regarding medicines.  The following changes have been made:  no change  Labs/ tests ordered today include:   Orders Placed This Encounter  Procedures  . Lipid panel  . Comprehensive metabolic panel  . EKG 12-Lead     Disposition:   FU with Yomar Mejorado C. Oval Linsey, MD in 6 months    Signed, Skeet Latch, MD  02/18/2016 6:05 AM    [facility]

## 2016-02-20 DIAGNOSIS — M79641 Pain in right hand: Secondary | ICD-10-CM | POA: Diagnosis not present

## 2016-02-20 DIAGNOSIS — M542 Cervicalgia: Secondary | ICD-10-CM | POA: Diagnosis not present

## 2016-02-20 DIAGNOSIS — G5602 Carpal tunnel syndrome, left upper limb: Secondary | ICD-10-CM | POA: Diagnosis not present

## 2016-02-20 DIAGNOSIS — M180 Bilateral primary osteoarthritis of first carpometacarpal joints: Secondary | ICD-10-CM | POA: Diagnosis not present

## 2016-02-23 ENCOUNTER — Other Ambulatory Visit: Payer: Self-pay | Admitting: Cardiovascular Disease

## 2016-02-23 DIAGNOSIS — E785 Hyperlipidemia, unspecified: Secondary | ICD-10-CM | POA: Diagnosis not present

## 2016-02-23 DIAGNOSIS — Z7901 Long term (current) use of anticoagulants: Secondary | ICD-10-CM | POA: Diagnosis not present

## 2016-02-24 DIAGNOSIS — G5602 Carpal tunnel syndrome, left upper limb: Secondary | ICD-10-CM | POA: Diagnosis not present

## 2016-02-24 DIAGNOSIS — M180 Bilateral primary osteoarthritis of first carpometacarpal joints: Secondary | ICD-10-CM | POA: Diagnosis not present

## 2016-02-24 DIAGNOSIS — M79644 Pain in right finger(s): Secondary | ICD-10-CM | POA: Diagnosis not present

## 2016-02-24 DIAGNOSIS — M542 Cervicalgia: Secondary | ICD-10-CM | POA: Diagnosis not present

## 2016-02-24 LAB — COMPREHENSIVE METABOLIC PANEL
ALT: 27 IU/L (ref 0–32)
AST: 23 IU/L (ref 0–40)
Albumin/Globulin Ratio: 1.6 (ref 1.2–2.2)
Albumin: 4.4 g/dL (ref 3.5–4.8)
Alkaline Phosphatase: 85 IU/L (ref 39–117)
BUN/Creatinine Ratio: 15 (ref 12–28)
BUN: 17 mg/dL (ref 8–27)
Bilirubin Total: 0.4 mg/dL (ref 0.0–1.2)
CALCIUM: 9.9 mg/dL (ref 8.7–10.3)
CO2: 27 mmol/L (ref 18–29)
Chloride: 98 mmol/L (ref 96–106)
Creatinine, Ser: 1.12 mg/dL — ABNORMAL HIGH (ref 0.57–1.00)
GFR calc Af Amer: 56 mL/min/{1.73_m2} — ABNORMAL LOW (ref >59)
GFR, EST NON AFRICAN AMERICAN: 48 mL/min/{1.73_m2} — AB (ref >59)
GLUCOSE: 153 mg/dL — AB (ref 65–99)
Globulin, Total: 2.7 g/dL (ref 1.5–4.5)
POTASSIUM: 4.5 mmol/L (ref 3.5–5.2)
Sodium: 141 mmol/L (ref 134–144)
Total Protein: 7.1 g/dL (ref 6.0–8.5)

## 2016-02-24 LAB — LIPID PANEL W/O CHOL/HDL RATIO
Cholesterol, Total: 176 mg/dL (ref 100–199)
HDL: 44 mg/dL (ref >39)
LDL Calculated: 99 mg/dL (ref 0–99)
TRIGLYCERIDES: 166 mg/dL — AB (ref 0–149)
VLDL Cholesterol Cal: 33 mg/dL (ref 5–40)

## 2016-03-24 DIAGNOSIS — Z7901 Long term (current) use of anticoagulants: Secondary | ICD-10-CM | POA: Diagnosis not present

## 2016-03-27 DIAGNOSIS — E119 Type 2 diabetes mellitus without complications: Secondary | ICD-10-CM | POA: Diagnosis not present

## 2016-04-06 DIAGNOSIS — M180 Bilateral primary osteoarthritis of first carpometacarpal joints: Secondary | ICD-10-CM | POA: Diagnosis not present

## 2016-04-07 DIAGNOSIS — M545 Low back pain: Secondary | ICD-10-CM | POA: Diagnosis not present

## 2016-04-07 DIAGNOSIS — M1990 Unspecified osteoarthritis, unspecified site: Secondary | ICD-10-CM | POA: Diagnosis not present

## 2016-04-07 DIAGNOSIS — I10 Essential (primary) hypertension: Secondary | ICD-10-CM | POA: Diagnosis not present

## 2016-04-07 DIAGNOSIS — G894 Chronic pain syndrome: Secondary | ICD-10-CM | POA: Diagnosis not present

## 2016-05-10 DIAGNOSIS — D34 Benign neoplasm of thyroid gland: Secondary | ICD-10-CM | POA: Diagnosis not present

## 2016-05-10 DIAGNOSIS — E042 Nontoxic multinodular goiter: Secondary | ICD-10-CM | POA: Diagnosis not present

## 2016-05-10 DIAGNOSIS — Z7901 Long term (current) use of anticoagulants: Secondary | ICD-10-CM | POA: Diagnosis not present

## 2016-05-17 DIAGNOSIS — E042 Nontoxic multinodular goiter: Secondary | ICD-10-CM | POA: Diagnosis not present

## 2016-05-17 DIAGNOSIS — M5030 Other cervical disc degeneration, unspecified cervical region: Secondary | ICD-10-CM | POA: Diagnosis not present

## 2016-05-17 DIAGNOSIS — D34 Benign neoplasm of thyroid gland: Secondary | ICD-10-CM | POA: Diagnosis not present

## 2016-05-17 DIAGNOSIS — I10 Essential (primary) hypertension: Secondary | ICD-10-CM | POA: Diagnosis not present

## 2016-05-20 ENCOUNTER — Other Ambulatory Visit: Payer: Self-pay | Admitting: Cardiovascular Disease

## 2016-05-20 NOTE — Telephone Encounter (Signed)
Rx(s) sent to pharmacy electronically.  

## 2016-05-20 NOTE — Telephone Encounter (Signed)
Please review for refill. Thanks!  

## 2016-06-07 DIAGNOSIS — Z7901 Long term (current) use of anticoagulants: Secondary | ICD-10-CM | POA: Diagnosis not present

## 2016-06-10 DIAGNOSIS — H401131 Primary open-angle glaucoma, bilateral, mild stage: Secondary | ICD-10-CM | POA: Diagnosis not present

## 2016-06-14 DIAGNOSIS — Z7901 Long term (current) use of anticoagulants: Secondary | ICD-10-CM | POA: Diagnosis not present

## 2016-06-22 ENCOUNTER — Telehealth: Payer: Self-pay | Admitting: Cardiovascular Disease

## 2016-06-22 DIAGNOSIS — Z7901 Long term (current) use of anticoagulants: Secondary | ICD-10-CM | POA: Diagnosis not present

## 2016-06-22 NOTE — Telephone Encounter (Signed)
New Message  Sansiaga from [facility] voiced there was a form faxed and calling to see if we've received it.  Please f/u with Sansiaga from [facility]  Reference number [id number]

## 2016-06-22 NOTE — Telephone Encounter (Signed)
Wrong number taken. Fax received and will fax back as requested

## 2016-06-23 DIAGNOSIS — M48062 Spinal stenosis, lumbar region with neurogenic claudication: Secondary | ICD-10-CM | POA: Diagnosis not present

## 2016-06-24 ENCOUNTER — Telehealth: Payer: Self-pay | Admitting: Cardiovascular Disease

## 2016-06-24 NOTE — Telephone Encounter (Signed)
New message    [facility] sent fax 06/10/16 for provider to fill out provider Query    Ref [id number]

## 2016-06-24 NOTE — Telephone Encounter (Signed)
Spoke with Cordie Grice and requested Dr Blenda Mounts name be removed from list to received these  Office note in reference over a year old and not related to cardiology or diagnosis Dr Oval Linsey is treating

## 2016-06-26 DIAGNOSIS — E119 Type 2 diabetes mellitus without complications: Secondary | ICD-10-CM | POA: Diagnosis not present

## 2016-07-01 ENCOUNTER — Telehealth: Payer: Self-pay | Admitting: *Deleted

## 2016-07-01 ENCOUNTER — Other Ambulatory Visit: Payer: Self-pay | Admitting: Cardiovascular Disease

## 2016-07-01 DIAGNOSIS — E782 Mixed hyperlipidemia: Secondary | ICD-10-CM

## 2016-07-01 DIAGNOSIS — Z79899 Other long term (current) drug therapy: Secondary | ICD-10-CM

## 2016-07-01 NOTE — Telephone Encounter (Signed)
Please review for refill. Thanks!  

## 2016-07-01 NOTE — Telephone Encounter (Signed)
-----   Message from Raiford Simmonds, RN sent at 04/23/2015  4:41 PM EDT ----- Mail lab slip  Liver ,lipid Due in jan 2017

## 2016-07-01 NOTE — Telephone Encounter (Signed)
Rx has been sent to the pharmacy electronically. ° °

## 2016-07-01 NOTE — Telephone Encounter (Signed)
Mailed letter and labslip 

## 2016-07-14 DIAGNOSIS — Z7901 Long term (current) use of anticoagulants: Secondary | ICD-10-CM | POA: Diagnosis not present

## 2016-07-22 ENCOUNTER — Other Ambulatory Visit (HOSPITAL_COMMUNITY): Payer: Self-pay | Admitting: Family Medicine

## 2016-07-26 DIAGNOSIS — M48062 Spinal stenosis, lumbar region with neurogenic claudication: Secondary | ICD-10-CM | POA: Diagnosis not present

## 2016-08-05 ENCOUNTER — Other Ambulatory Visit: Payer: Self-pay | Admitting: Obstetrics and Gynecology

## 2016-08-05 ENCOUNTER — Other Ambulatory Visit (HOSPITAL_COMMUNITY): Payer: Self-pay | Admitting: Family Medicine

## 2016-08-05 DIAGNOSIS — E2839 Other primary ovarian failure: Secondary | ICD-10-CM

## 2016-08-05 DIAGNOSIS — Z1231 Encounter for screening mammogram for malignant neoplasm of breast: Secondary | ICD-10-CM

## 2016-08-12 DIAGNOSIS — H5201 Hypermetropia, right eye: Secondary | ICD-10-CM | POA: Diagnosis not present

## 2016-08-12 DIAGNOSIS — E119 Type 2 diabetes mellitus without complications: Secondary | ICD-10-CM | POA: Diagnosis not present

## 2016-08-16 NOTE — Progress Notes (Signed)
Cardiology Office Note   Date:  08/17/2016   ID:  [name], DOB 1940/08/11, MRN [id number]  PCP:  Purvis Kilts, MD  Cardiologist:   Skeet Latch, MD   Chief Complaint  Patient presents with  . Follow-up    6 months;  . Shortness of Breath    occassionally.     History of Present Illness: [name] is a 76 y.o. female with CAD (50% LAD), chronic diastolic heart failure (grade 2), hypertension, hyperlipidemia, prior DVT on warfarin, and diabetes type 2 who presents for follow up.  She was first seen 04/2015 at which time she reported occasional chest pain.  She had an exercise Myoview 04/2015 that showed LVEF 78% with a small defect of moderate severity in the mid anterior and apical anterior region.  This was felt to be due to breast attenuation artifact.  However, she underwent cardiac catheterization on 05/26/15 that revealed a 50% LAD lesion. There was concern that there may be a component of vasospasm so long-acting nitrates were started. Her blood pressure and hyperlipidemia medications have been titrated due to poor control.  She is also on Lasix due to lower extremity edema.  Since her last appointment [name] has been doing well.  She had a bad cold and has been exhausted for the last 3 weeks.  While sick she felt short of breath and felt heaviness in her chest.  Since the URI is gone she no longer has these symptoms but is very tired.  She continues to have mild lower extremity edema that is improved with lasix.  She denies orthopnea or PND.  She stopped pravastatin due to it causing her intra-ocular pressure to rise.  She hasn't tolerated other statins due to myalgias.     Past Medical History:  Diagnosis Date  . Antral gastritis    EGD 11/15  . Asthmatic bronchitis   . Back pain   . Chronic diastolic heart failure (HCC) 05/20/2015   Grade 2 diastolic dysfunction.  04/2015.  [name] Chronic kidney disease    kidney function low  . Diabetes mellitus      x 5 yrs  . DVT of axillary vein, acute left ([place]) 07/24/12  . GERD (gastroesophageal reflux disease)   . Glaucoma   . Hyperlipidemia 05/20/2015  . Hypertension   . Hypothyroidism   . Kidney stones   . Mixed hyperlipidemia   . Peripheral venous insufficiency   . Pinched nerve    right elbow  . Sigmoid diverticulitis   . Vertigo    chonic    Past Surgical History:  Procedure Laterality Date  . ABDOMINAL HYSTERECTOMY    . BACK SURGERY     spinal   . CARDIAC CATHETERIZATION N/A 05/26/2015   Procedure: Left Heart Cath and Coronary Angiography;  Surgeon: Jettie Booze, MD;  Location: [place] [facility];  Service: Cardiovascular;  Laterality: N/A;  . CHOLECYSTECTOMY    . COLON SURGERY    . COLONOSCOPY N/A 12/27/2013   Procedure: COLONOSCOPY;  Surgeon: Rogene Houston, MD;  [facility];  Service: Endoscopy;  Laterality: N/A;  200  . COLOSTOMY CLOSURE    . ESOPHAGOGASTRODUODENOSCOPY N/A 05/07/2014   Procedure: ESOPHAGOGASTRODUODENOSCOPY (EGD);  Surgeon: Rogene Houston, MD;  [facility];  Service: Endoscopy;  Laterality: N/A;  . EYE SURGERY    . fracture left foot    . HERNIA REPAIR    . NM MYOCAR PERF WALL MOTION  01/28/2009   Normal  . OTHER SURGICAL HISTORY     colostomy, colostomy reversal, for diverticulitis surgical hernia repair, arm surgery, neck surgery  . US ECHOCARDIOGRAPHY  02/11/2010   Mild MR,trace TR & AI     Current Outpatient Prescriptions  Medication Sig Dispense Refill  . albuterol (PROVENTIL HFA;VENTOLIN HFA) 108 (90 BASE) MCG/ACT inhaler Inhale 2 puffs into the lungs every 4 (four) hours as needed for shortness of breath. 1 Inhaler 0  . allopurinol (ZYLOPRIM) 100 MG tablet Take 100 mg by mouth daily as needed.     [name] amitriptyline (ELAVIL) 25 MG tablet Take 50 mg by mouth at bedtime.     . Black Cohosh 40 MG CAPS Take 40 mg by mouth 2 (two) times daily.    . carvedilol (COREG) 6.25 MG tablet TAKE 1 TABLET BY MOUTH TWICE A DAY  180 tablet 2  . cetirizine (ZYRTEC) 10 MG tablet Take 10 mg by mouth daily.    . Cholecalciferol (VITAMIN D-3) 1000 units CAPS Take 1 capsule by mouth daily.    [name] esomeprazole (NEXIUM) 20 MG capsule Take 20 mg by mouth daily at 12 noon.    . furosemide (LASIX) 40 MG tablet Take 40 mg by mouth daily.     . iron polysaccharides (NU-IRON) 150 MG capsule Take 150 mg by mouth daily.    . isosorbide mononitrate (IMDUR) 30 MG 24 hr tablet TAKE 1 TABLET (30 MG TOTAL) BY MOUTH DAILY. 90 tablet 3  . latanoprost (XALATAN) 0.005 % ophthalmic solution Place 1 drop into both eyes at bedtime.     [name] losartan (COZAAR) 50 MG tablet Take 25 mg by mouth daily.    . meclizine (ANTIVERT) 25 MG tablet Take 25 mg by mouth as needed for dizziness.     . Omega-3 Fatty Acids (FISH OIL) 1200 MG CAPS Take 1 capsule by mouth 2 (two) times daily.    . polyethylene glycol (MIRALAX / GLYCOLAX) packet Take 17 g by mouth daily.    . potassium chloride SA (K-DUR,KLOR-CON) 20 MEQ tablet Take 10 mEq by mouth daily.     [name] warfarin (COUMADIN) 1 MG tablet Take 1 mg by mouth daily.  11   No current facility-administered medications for this visit.     Allergies:   Vioxx [rofecoxib]; Penicillins; Pravastatin; Codeine; Motrin [ibuprofen]; and Sulfur    Social History:  The patient  reports that she quit smoking about 2 years ago. Her smoking use included Cigarettes. She has never used smokeless tobacco. She reports that she does not drink alcohol or use drugs.   Family History:  The patient's family history includes CVA (age of onset: 25) in her maternal grandmother; Cancer in her mother; Cancer (age of onset: 41) in her sister; Heart attack (age of onset: 16) in her brother; Heart disease in her maternal grandmother; Other (age of onset: 49) in her mother; Stroke in her maternal grandmother.    ROS:  Please see the history of present illness.   Otherwise, review of systems are positive for musculoskeletal complaints.    All other  systems are reviewed and negative.    PHYSICAL EXAM: VS:  BP 140/78   Pulse 81   Ht 5\' 2"  (1.575 m)   Wt 82.5 kg (181 lb 12.8 oz)   BMI 33.25 kg/m  , BMI Body mass index is 33.25 kg/m. GENERAL:  Ill and tired appearing HEENT:  Pupils equal round and reactive, fundi not visualized, oral mucosa unremarkable NECK:  No JVD.   Waveform within normal limits, carotid upstroke brisk and symmetric, no bruits, no thyromegaly LYMPHATICS:  No cervical adenopathy LUNGS:  Clear to auscultation bilaterally HEART:  Tachycardic, regular rhythm.  PMI not displaced or sustained,S1 and S2 within normal limits, no S3, no S4, no clicks, no rubs, no murmurs ABD:  Flat, positive bowel sounds normal in frequency in pitch, no bruits, no rebound, no guarding, no midline pulsatile mass, no hepatomegaly, no splenomegaly EXT:  2 plus radial pulses, 1+ DP/PT bilaterally, trace edema to the lower tibia bilaterally, no cyanosis no clubbing SKIN:  No rashes no nodules.  Erythema of bilateral anterior tibia.  Not warm to touch.   NEURO:  Cranial nerves II through XII grossly intact, motor grossly intact throughout PSYCH:  Cognitively intact, oriented to person place and time   EKG:  EKG is ordered today. 02/16/16: Sinus rhythm rate 83 bpm.  PAC.  RBBB.  LPFB.  Exercise Myoview 04/30/15  The left ventricular ejection fraction is hyperdynamic (>65%).  Nuclear stress EF: 78%.  Blood pressure demonstrated a hypertensive response to exercise.  Upsloping ST segment depression ST segment depression was noted during stress in the III, II and aVF leads. This is not diagnostic for ischemia.  Defect 1: There is a small defect of moderate severity present in the mid anterior and apical anterior location. Wall motion is normal, so this is likely artifact due to breast attenuation. However, cannot rule out LAD ischemia.  This is a low risk study.  Cardiac cath 05/26/15:  Mid LAD lesion, 50% stenosed. FFR of this lesion  showed value of 0.85. This is felt to be nonsignificant.  Normal LVEDP.  Continue aggressive medical therapy. Would consider adding long-acting nitrate as the patient may have some component of vasospasm. Of note, in the future , if repeat catheterization was needed , would consider using a 5 [place] guide catheter as her left main is very short and the catheter tends to deep seat Into either the LAD or the circumflex.  Recent Labs: 02/23/2016: ALT 27; BUN 17; Creatinine, Ser 1.12; Potassium 4.5; Sodium 141   03/12/15: INR 2.9   Lipid Panel    Component Value Date/Time   CHOL 176 02/23/2016 0748   TRIG 166 (H) 02/23/2016 0748   HDL 44 02/23/2016 0748   LDLCALC 99 02/23/2016 0748      Wt Readings from Last 3 Encounters:  08/17/16 82.5 kg (181 lb 12.8 oz)  02/16/16 84.3 kg (185 lb 12.8 oz)  07/25/15 80.9 kg (178 lb 7 oz)      ASSESSMENT AND PLAN:  # Non-obstructive CAD: 50% LAD obstruction.  FFR was within normal limits.  However there was some concern for coronary spasms. She is doing well on isosorbide mononitrate 30 mg daily.  She will continue carvedilol.  # Hyperlipidemia: [name] hasn't been able to tolerate pravastatin or rosuvastatin and has known coronary artery disease. We will refer her to lipid clinic for consideration of a PCSK9 inhibitor.  Continue fish oil we will check fasting lipids and a CMP today..    # Chronic diastolic heart failure: [name] is euvolemic and doesn't have any heart failure symptoms at this time. Continue carvedilol, losartan, and Lasix.   # Hypertension: BP is above her goal of <130/80.  She reports that it has been well-controlled at home. She has not yet taken her medication today. I've asked her to keep a log of her blood pressures and bring this when she meets  with her pharmacist. Continue losartan, carvedilol, and Imdur.    Current medicines are reviewed at length with the patient today.  The patient does not have concerns  regarding medicines.  The following changes have been made:  no change  Labs/ tests ordered today include:   Orders Placed This Encounter  Procedures  . Lipid panel  . Comprehensive metabolic panel     Disposition:   FU with Darlene Bramblett C. Oval Linsey, MD in 6 months.  Pharmacy in 2 weeks.   Signed, Skeet Latch, MD  08/17/2016 8:19 AM    [facility]

## 2016-08-17 ENCOUNTER — Ambulatory Visit (INDEPENDENT_AMBULATORY_CARE_PROVIDER_SITE_OTHER): Payer: Medicare Other | Admitting: Cardiovascular Disease

## 2016-08-17 ENCOUNTER — Encounter: Payer: Self-pay | Admitting: Cardiovascular Disease

## 2016-08-17 VITALS — BP 140/78 | HR 81 | Ht 62.0 in | Wt 181.8 lb

## 2016-08-17 DIAGNOSIS — R0602 Shortness of breath: Secondary | ICD-10-CM

## 2016-08-17 DIAGNOSIS — I2510 Atherosclerotic heart disease of native coronary artery without angina pectoris: Secondary | ICD-10-CM

## 2016-08-17 DIAGNOSIS — I2583 Coronary atherosclerosis due to lipid rich plaque: Secondary | ICD-10-CM

## 2016-08-17 DIAGNOSIS — I5032 Chronic diastolic (congestive) heart failure: Secondary | ICD-10-CM | POA: Diagnosis not present

## 2016-08-17 DIAGNOSIS — R609 Edema, unspecified: Secondary | ICD-10-CM

## 2016-08-17 DIAGNOSIS — I10 Essential (primary) hypertension: Secondary | ICD-10-CM | POA: Diagnosis not present

## 2016-08-17 DIAGNOSIS — E782 Mixed hyperlipidemia: Secondary | ICD-10-CM

## 2016-08-17 LAB — LIPID PANEL
CHOLESTEROL: 215 mg/dL — AB (ref <200)
HDL: 44 mg/dL — ABNORMAL LOW (ref >50)
LDL CALC: 138 mg/dL — AB (ref <100)
Total CHOL/HDL Ratio: 4.9 Ratio (ref <5.0)
Triglycerides: 164 mg/dL — ABNORMAL HIGH (ref <150)
VLDL: 33 mg/dL — AB (ref <30)

## 2016-08-17 LAB — COMPREHENSIVE METABOLIC PANEL
ALBUMIN: 3.9 g/dL (ref 3.6–5.1)
ALT: 41 U/L — ABNORMAL HIGH (ref 6–29)
AST: 20 U/L (ref 10–35)
Alkaline Phosphatase: 75 U/L (ref 33–130)
BILIRUBIN TOTAL: 0.6 mg/dL (ref 0.2–1.2)
BUN: 22 mg/dL (ref 7–25)
CALCIUM: 9.6 mg/dL (ref 8.6–10.4)
CO2: 28 mmol/L (ref 20–31)
Chloride: 106 mmol/L (ref 98–110)
Creat: 1.12 mg/dL — ABNORMAL HIGH (ref 0.60–0.93)
GLUCOSE: 123 mg/dL — AB (ref 65–99)
POTASSIUM: 4.6 mmol/L (ref 3.5–5.3)
Sodium: 142 mmol/L (ref 135–146)
Total Protein: 6.1 g/dL (ref 6.1–8.1)

## 2016-08-17 NOTE — Patient Instructions (Signed)
Medication Instructions:  Your physician recommends that you continue on your current medications as directed. Please refer to the Current Medication list given to you today.  Labwork: LP/CMET AT [facility] ON THE FIRST FLOOR  Testing/Procedures: NONE  Follow-Up: Your physician recommends that you schedule a follow-up appointment in: 2 WEEKS WITH PHARM D FOR LIPID AND BLOOD PRESSURE  Your physician wants you to follow-up in: [place] will receive a reminder letter in the mail two months in advance. If you don't receive a letter, please call our office to schedule the follow-up appointment.  Any Other Special Instructions Will Be Listed Below (If Applicable). KEEP LOG OF BLOOD PRESSURE AND BRING TO YOUR APPOINTMENT IN 2 WEEK S  If you need a refill on your cardiac medications before your next appointment, please call your pharmacy.

## 2016-08-19 ENCOUNTER — Ambulatory Visit: Payer: Medicare Other | Admitting: Cardiovascular Disease

## 2016-08-20 DIAGNOSIS — M48062 Spinal stenosis, lumbar region with neurogenic claudication: Secondary | ICD-10-CM | POA: Diagnosis not present

## 2016-08-27 DIAGNOSIS — Z7901 Long term (current) use of anticoagulants: Secondary | ICD-10-CM | POA: Diagnosis not present

## 2016-08-30 ENCOUNTER — Ambulatory Visit (HOSPITAL_COMMUNITY)
Admission: RE | Admit: 2016-08-30 | Discharge: 2016-08-30 | Disposition: A | Discharge status: Home / Self Care | Payer: Medicare Other | Source: Ambulatory Visit | Attending: Obstetrics and Gynecology | Admitting: Obstetrics and Gynecology

## 2016-08-30 ENCOUNTER — Ambulatory Visit (HOSPITAL_COMMUNITY)
Admission: RE | Admit: 2016-08-30 | Discharge: 2016-08-30 | Disposition: A | Discharge status: Home / Self Care | Payer: Medicare Other | Source: Ambulatory Visit | Attending: Family Medicine | Admitting: Family Medicine

## 2016-08-30 DIAGNOSIS — Z13820 Encounter for screening for osteoporosis: Secondary | ICD-10-CM | POA: Insufficient documentation

## 2016-08-30 DIAGNOSIS — E2839 Other primary ovarian failure: Secondary | ICD-10-CM | POA: Diagnosis present

## 2016-08-30 DIAGNOSIS — Z1231 Encounter for screening mammogram for malignant neoplasm of breast: Secondary | ICD-10-CM | POA: Insufficient documentation

## 2016-08-30 DIAGNOSIS — M85851 Other specified disorders of bone density and structure, right thigh: Secondary | ICD-10-CM | POA: Diagnosis not present

## 2016-08-30 DIAGNOSIS — M8580 Other specified disorders of bone density and structure, unspecified site: Secondary | ICD-10-CM | POA: Insufficient documentation

## 2016-08-31 ENCOUNTER — Ambulatory Visit (INDEPENDENT_AMBULATORY_CARE_PROVIDER_SITE_OTHER): Payer: Medicare Other | Admitting: Pharmacist Clinician (PhC)/ Clinical Pharmacy Specialist

## 2016-08-31 DIAGNOSIS — E782 Mixed hyperlipidemia: Secondary | ICD-10-CM | POA: Diagnosis not present

## 2016-08-31 NOTE — Assessment & Plan Note (Signed)
Will start the process to see if her insurance will cover [place] [employer].  Patient was encouraged to continue with healthy eating habits and increase her exercise as tolerated.

## 2016-08-31 NOTE — Assessment & Plan Note (Signed)
Blood pressure good in the office today and home readings all look good as well.  She will continue with losartan 25 mg daily and carvedilol 6.25 mg twice daily.  She can continue with this regimen as well as regular home blood pressure checks.

## 2016-08-31 NOTE — Patient Instructions (Signed)
We will start the process to determine if you qualify for Repatha injection (Pushtronix).   If you don't qualify we will try the ezetimibe 10 mg tablets and repeat your cholesterol labs in 3 months  Your blood pressure today is 136/86 (goal is < 130/80)  Check your blood pressure at home daily and keep record of the readings.  Take your BP meds as follows:  Continue with all your current medications  Bring all of your meds, your BP cuff and your record of home blood pressures to your next appointment.  Exercise as you're able, try to walk approximately 30 minutes per day.  Keep salt intake to a minimum, especially watch canned and prepared boxed foods.  Eat more fresh fruits and vegetables and fewer canned items.  Avoid eating in fast food restaurants.    HOW TO TAKE YOUR BLOOD PRESSURE: . Rest 5 minutes before taking your blood pressure. .  Don't smoke or drink caffeinated beverages for at least 30 minutes before. . Take your blood pressure before (not after) you eat. . Sit comfortably with your back supported and both feet on the floor (don't cross your legs). . Elevate your arm to heart level on a table or a desk. . Use the proper sized cuff. It should fit smoothly and snugly around your bare upper arm. There should be enough room to slip a fingertip under the cuff. The bottom edge of the cuff should be 1 inch above the crease of the elbow. . Ideally, take 3 measurements at one sitting and record the average.

## 2016-08-31 NOTE — Progress Notes (Signed)
08/31/2016 [name] 12-05-1940 [id number]   HPI:  [name] is a 76 y.o. female patient of Dr Oval Linsey, who presents today for a lipid clinic evaluation.  Her medical history is significant for non-obstructive CAD with a 50% obstruction of the LAD.  She had a left heart catheterization in November 2016 because of chest pressure, not always associated with exertion.    Current Medications:  None for cholesterol  Losartan 25 mg qd for hypertension.  Cholesterol Goals:   LDL < 70  Blood Pressure Goal:  < 130/80  Intolerant/previously tried:  Cholesterol:  Rosuvastatin (strength unknown) and pravastatin 40, 20 and 10 mg all caused myalgias  Family history:   Maternal grandmother had multiple MI's and died from stroke in her 61's  Diet:   Eats mostly home cooked foods, no added salt.  Eats only occasional fried foods and avoids pastas and potatoes and other white foods.    Exercise:    No regular exercise, although does keep up with household work  Labs:   08/2016:  TC 215, TC 164, HDL 44, LDL 138 (no meds)  02/2016:  TC 176, TG 166, HDL 44, LDL 99 (pravastatin 40 mg)  Home Blood Pressure Readings:  Took daily readings for past 2 weeks (16 readings).  Average was 120/69 with a range of 95-137/46-82.   Has a wrist cuff that she tested against her older machine, states they were similar.    Current Outpatient Prescriptions  Medication Sig Dispense Refill  . albuterol (PROVENTIL HFA;VENTOLIN HFA) 108 (90 BASE) MCG/ACT inhaler Inhale 2 puffs into the lungs every 4 (four) hours as needed for shortness of breath. 1 Inhaler 0  . allopurinol (ZYLOPRIM) 100 MG tablet Take 100 mg by mouth daily as needed.     [name] amitriptyline (ELAVIL) 25 MG tablet Take 50 mg by mouth at bedtime.     . Black Cohosh 40 MG CAPS Take 40 mg by mouth 2 (two) times daily.    . carvedilol (COREG) 6.25 MG tablet TAKE 1 TABLET BY MOUTH TWICE A DAY 180 tablet 2  . cetirizine (ZYRTEC) 10 MG tablet Take 10 mg  by mouth daily.    . Cholecalciferol (VITAMIN D-3) 1000 units CAPS Take 1 capsule by mouth daily.    [name] esomeprazole (NEXIUM) 20 MG capsule Take 20 mg by mouth daily at 12 noon.    . furosemide (LASIX) 40 MG tablet Take 40 mg by mouth daily.     . iron polysaccharides (NU-IRON) 150 MG capsule Take 150 mg by mouth daily.    . isosorbide mononitrate (IMDUR) 30 MG 24 hr tablet TAKE 1 TABLET (30 MG TOTAL) BY MOUTH DAILY. 90 tablet 3  . latanoprost (XALATAN) 0.005 % ophthalmic solution Place 1 drop into both eyes at bedtime.     [name] losartan (COZAAR) 50 MG tablet Take 25 mg by mouth daily.    . meclizine (ANTIVERT) 25 MG tablet Take 25 mg by mouth as needed for dizziness.     . Omega-3 Fatty Acids (FISH OIL) 1200 MG CAPS Take 1 capsule by mouth 2 (two) times daily.    . polyethylene glycol (MIRALAX / GLYCOLAX) packet Take 17 g by mouth daily.    . potassium chloride SA (K-DUR,KLOR-CON) 20 MEQ tablet Take 10 mEq by mouth daily.     [name] warfarin (COUMADIN) 1 MG tablet Take 1 mg by mouth daily.  11   No current facility-administered medications for this visit.     Allergies  Allergen Reactions  . Vioxx [Rofecoxib] Shortness Of Breath  . Penicillins     rash  . Pravastatin   . Codeine Rash  . Motrin [Ibuprofen] Rash  . Sulfur Rash    Past Medical History:  Diagnosis Date  . Antral gastritis    EGD 11/15  . Asthmatic bronchitis   . Back pain   . Chronic diastolic heart failure (HCC) 05/20/2015   Grade 2 diastolic dysfunction.  04/2015.  [name] Chronic kidney disease    kidney function low  . Diabetes mellitus    x 5 yrs  . DVT of axillary vein, acute left ([place]) 07/24/12  . GERD (gastroesophageal reflux disease)   . Glaucoma   . Hyperlipidemia 05/20/2015  . Hypertension   . Hypothyroidism   . Kidney stones   . Mixed hyperlipidemia   . Peripheral venous insufficiency   . Pinched nerve    right elbow  . Sigmoid diverticulitis   . Vertigo    chonic    Blood pressure 128/88, pulse  84.   HTN (hypertension) Blood pressure good in the office today and home readings all look good as well.  She will continue with losartan 25 mg daily and carvedilol 6.25 mg twice daily.  She can continue with this regimen as well as regular home blood pressure checks.   Hyperlipidemia Will start the process to see if her insurance will cover [place] [employer]htronix.  Patient was encouraged to continue with healthy eating habits and increase her exercise as tolerated.      Tommy Medal PharmD CPP [facility]

## 2016-09-01 ENCOUNTER — Other Ambulatory Visit: Payer: Self-pay | Admitting: Pediatric Genetics

## 2016-09-01 DIAGNOSIS — M48062 Spinal stenosis, lumbar region with neurogenic claudication: Secondary | ICD-10-CM

## 2016-09-13 ENCOUNTER — Ambulatory Visit
Admission: RE | Admit: 2016-09-13 | Discharge: 2016-09-13 | Disposition: A | Discharge status: Home / Self Care | Payer: Medicare Other | Source: Ambulatory Visit | Attending: Pediatric Genetics | Admitting: Pediatric Genetics

## 2016-09-13 DIAGNOSIS — M48061 Spinal stenosis, lumbar region without neurogenic claudication: Secondary | ICD-10-CM | POA: Diagnosis not present

## 2016-09-13 DIAGNOSIS — M48062 Spinal stenosis, lumbar region with neurogenic claudication: Secondary | ICD-10-CM

## 2016-09-25 DIAGNOSIS — E119 Type 2 diabetes mellitus without complications: Secondary | ICD-10-CM | POA: Diagnosis not present

## 2016-11-02 DIAGNOSIS — Z0000 Encounter for general adult medical examination without abnormal findings: Secondary | ICD-10-CM | POA: Diagnosis not present

## 2016-11-02 DIAGNOSIS — R350 Frequency of micturition: Secondary | ICD-10-CM | POA: Diagnosis not present

## 2016-11-02 DIAGNOSIS — Z1389 Encounter for screening for other disorder: Secondary | ICD-10-CM | POA: Diagnosis not present

## 2016-11-02 DIAGNOSIS — N342 Other urethritis: Secondary | ICD-10-CM | POA: Diagnosis not present

## 2016-11-02 DIAGNOSIS — E119 Type 2 diabetes mellitus without complications: Secondary | ICD-10-CM | POA: Diagnosis not present

## 2016-11-16 DIAGNOSIS — N390 Urinary tract infection, site not specified: Secondary | ICD-10-CM | POA: Diagnosis not present

## 2016-11-17 DIAGNOSIS — N390 Urinary tract infection, site not specified: Secondary | ICD-10-CM | POA: Diagnosis not present

## 2016-12-01 DIAGNOSIS — Z23 Encounter for immunization: Secondary | ICD-10-CM | POA: Diagnosis not present

## 2016-12-01 DIAGNOSIS — I10 Essential (primary) hypertension: Secondary | ICD-10-CM | POA: Diagnosis not present

## 2016-12-01 DIAGNOSIS — E1165 Type 2 diabetes mellitus with hyperglycemia: Secondary | ICD-10-CM | POA: Diagnosis not present

## 2016-12-01 DIAGNOSIS — J449 Chronic obstructive pulmonary disease, unspecified: Secondary | ICD-10-CM | POA: Diagnosis not present

## 2016-12-01 DIAGNOSIS — E782 Mixed hyperlipidemia: Secondary | ICD-10-CM | POA: Diagnosis not present

## 2016-12-27 DIAGNOSIS — E119 Type 2 diabetes mellitus without complications: Secondary | ICD-10-CM | POA: Diagnosis not present

## 2017-01-13 DIAGNOSIS — H401112 Primary open-angle glaucoma, right eye, moderate stage: Secondary | ICD-10-CM | POA: Diagnosis not present

## 2017-01-13 DIAGNOSIS — H401121 Primary open-angle glaucoma, left eye, mild stage: Secondary | ICD-10-CM | POA: Diagnosis not present

## 2017-03-02 ENCOUNTER — Encounter: Payer: Self-pay | Admitting: Cardiovascular Disease

## 2017-03-02 ENCOUNTER — Ambulatory Visit (INDEPENDENT_AMBULATORY_CARE_PROVIDER_SITE_OTHER): Payer: Medicare Other | Admitting: Cardiovascular Disease

## 2017-03-02 VITALS — BP 124/66 | HR 76 | Ht 62.25 in | Wt 178.8 lb

## 2017-03-02 DIAGNOSIS — I5033 Acute on chronic diastolic (congestive) heart failure: Secondary | ICD-10-CM | POA: Diagnosis not present

## 2017-03-02 DIAGNOSIS — E784 Other hyperlipidemia: Secondary | ICD-10-CM

## 2017-03-02 DIAGNOSIS — Z79899 Other long term (current) drug therapy: Secondary | ICD-10-CM | POA: Diagnosis not present

## 2017-03-02 DIAGNOSIS — I2583 Coronary atherosclerosis due to lipid rich plaque: Secondary | ICD-10-CM | POA: Diagnosis not present

## 2017-03-02 DIAGNOSIS — I2510 Atherosclerotic heart disease of native coronary artery without angina pectoris: Secondary | ICD-10-CM | POA: Diagnosis not present

## 2017-03-02 DIAGNOSIS — E7849 Other hyperlipidemia: Secondary | ICD-10-CM

## 2017-03-02 DIAGNOSIS — E782 Mixed hyperlipidemia: Secondary | ICD-10-CM | POA: Diagnosis not present

## 2017-03-02 DIAGNOSIS — R0602 Shortness of breath: Secondary | ICD-10-CM

## 2017-03-02 MED ORDER — FUROSEMIDE 40 MG PO TABS: 40.0000 mg | ORAL_TABLET | Freq: Every day | ORAL | 5 refills | Status: DC

## 2017-03-02 NOTE — Progress Notes (Signed)
Cardiology Office Note   Date:  03/02/2017   ID:  [name], DOB 1941/02/04, MRN [id number]  PCP:  Sharilyn Sites, MD  Cardiologist:   Skeet Latch, MD   No chief complaint on file.    History of Present Illness: [name] is a 76 y.o. female with CAD (50% LAD), chronic diastolic heart failure (grade 2), hypertension, hyperlipidemia, prior DVT on warfarin, and diabetes type 2 who presents for follow up.  She was first seen 04/2015 at which time she reported occasional chest pain.  She had an exercise Myoview 04/2015 that showed LVEF 78% with a small defect of moderate severity in the mid anterior and apical anterior region.  This was felt to be due to breast attenuation artifact.  However, she underwent cardiac catheterization on 05/26/15 that revealed a 50% LAD lesion. There was concern that there may be a component of vasospasm so long-acting nitrates were started. Her blood pressure and hyperlipidemia medications have been titrated due to poor control.  She is also on Lasix due to lower extremity edema.  At her last appointment [name]' blood pressure was poorly-controlled. However, she has not yet taken her medication. She was referred for hypertension clinic and asked to keep a log of her blood pressures. At that appointment her blood pressure log from home was good and her blood pressure was near goal in clinic, so no changes were made at that time. She was also asked to see the lipid clinic due to statin intolerance. The insurance approval process was started for PCSK9 inhibitors.  She still has not heard back from her insurance company. Overall she has been feeling well. Her main complaint is lack of energy and hot flashes. She is unable to use estrogen replacement therapy. She uses black cohosh and a medicine called flash ease. She continues to have hot flashes ever since menopause. She does report some mild shortness of breath that is worse in the summer time. It  improves with the use of her inhaler. She has mild LE edema but denies orthopnea, or PND. She has been trying to do some exercising in the house but is unable to outside due to the weather.  She has been struggling with the cost of her medications because she is in the doughnut hole.   Past Medical History:  Diagnosis Date  . Antral gastritis    EGD 11/15  . Asthmatic bronchitis   . Back pain   . Chronic diastolic heart failure (HCC) 05/20/2015   Grade 2 diastolic dysfunction.  04/2015.  [name] Chronic kidney disease    kidney function low  . Diabetes mellitus    x 5 yrs  . DVT of axillary vein, acute left ([place]) 07/24/12  . GERD (gastroesophageal reflux disease)   . Glaucoma   . Hyperlipidemia 05/20/2015  . Hypertension   . Hypothyroidism   . Kidney stones   . Mixed hyperlipidemia   . Peripheral venous insufficiency   . Pinched nerve    right elbow  . Sigmoid diverticulitis   . Vertigo    chonic    Past Surgical History:  Procedure Laterality Date  . ABDOMINAL HYSTERECTOMY    . BACK SURGERY     spinal   . CARDIAC CATHETERIZATION N/A 05/26/2015   Procedure: Left Heart Cath and Coronary Angiography;  Surgeon: Jettie Booze, MD;  Location: [facility];  Service: Cardiovascular;  Laterality: N/A;  . CHOLECYSTECTOMY    . COLON SURGERY    .  COLONOSCOPY N/A 12/27/2013   Procedure: COLONOSCOPY;  Surgeon: Rogene Houston, MD;  [facility];  Service: Endoscopy;  Laterality: N/A;  200  . COLOSTOMY CLOSURE    . ESOPHAGOGASTRODUODENOSCOPY N/A 05/07/2014   Procedure: ESOPHAGOGASTRODUODENOSCOPY (EGD);  Surgeon: Rogene Houston, MD;  Location: [facility];  Service: Endoscopy;  Laterality: N/A;  . EYE SURGERY    . fracture left foot    . HERNIA REPAIR    . NM MYOCAR PERF WALL MOTION  01/28/2009   Normal  . OTHER SURGICAL HISTORY     colostomy, colostomy reversal, for diverticulitis surgical hernia repair, arm surgery, neck surgery  . US ECHOCARDIOGRAPHY   02/11/2010   Mild MR,trace TR & AI     Current Outpatient Prescriptions  Medication Sig Dispense Refill  . albuterol (PROVENTIL HFA;VENTOLIN HFA) 108 (90 BASE) MCG/ACT inhaler Inhale 2 puffs into the lungs every 4 (four) hours as needed for shortness of breath. 1 Inhaler 0  . allopurinol (ZYLOPRIM) 100 MG tablet Take 100 mg by mouth daily as needed.     [name] amitriptyline (ELAVIL) 25 MG tablet Take 50 mg by mouth at bedtime.     . Black Cohosh 40 MG CAPS Take 40 mg by mouth 2 (two) times daily.    . carvedilol (COREG) 6.25 MG tablet TAKE 1 TABLET BY MOUTH TWICE A DAY 180 tablet 2  . cetirizine (ZYRTEC) 10 MG tablet Take 10 mg by mouth daily.    . Cholecalciferol (VITAMIN D-3) 1000 units CAPS Take 1 capsule by mouth daily.    [name] esomeprazole (NEXIUM) 20 MG capsule Take 20 mg by mouth daily at 12 noon.    . furosemide (LASIX) 40 MG tablet Take 1 tablet (40 mg total) by mouth daily. OK TO TAKE EXTRA DAILY AS NEEDED FOR SWELLING 45 tablet 5  . isosorbide mononitrate (IMDUR) 30 MG 24 hr tablet TAKE 1 TABLET (30 MG TOTAL) BY MOUTH DAILY. 90 tablet 3  . latanoprost (XALATAN) 0.005 % ophthalmic solution Place 1 drop into both eyes at bedtime.     [name] losartan (COZAAR) 50 MG tablet Take 25 mg by mouth daily.    . meclizine (ANTIVERT) 25 MG tablet Take 25 mg by mouth as needed for dizziness.     . Omega-3 Fatty Acids (FISH OIL) 1200 MG CAPS Take 1 capsule by mouth 2 (two) times daily.    . polyethylene glycol (MIRALAX / GLYCOLAX) packet Take 17 g by mouth daily.    . potassium chloride SA (K-DUR,KLOR-CON) 20 MEQ tablet Take 10 mEq by mouth daily.     [name] warfarin (COUMADIN) 1 MG tablet Take 1 mg by mouth daily.  11   No current facility-administered medications for this visit.     Allergies:   Vioxx [rofecoxib]; Metformin and related; Penicillins; Pravastatin; Codeine; Motrin [ibuprofen]; and Sulfur    Social History:  The patient  reports that she quit smoking about 3 years ago. Her smoking use  included Cigarettes. She has never used smokeless tobacco. She reports that she does not drink alcohol or use drugs.   Family History:  The patient's family history includes CVA (age of onset: 68) in her maternal grandmother; Cancer in her mother; Cancer (age of onset: 44) in her sister; Heart attack (age of onset: 8) in her brother; Heart disease in her maternal grandmother; Other (age of onset: 74) in her mother; Stroke in her maternal grandmother.    ROS:  Please see the history of present illness.  Otherwise, review of systems are positive for hiatal hernia, GERD    All other systems are reviewed and negative.    PHYSICAL EXAM: VS:  BP 124/66   Pulse 76   Ht 5' 2.25" (1.581 m)   Wt 81.1 kg (178 lb 12.8 oz)   BMI 32.44 kg/m  , BMI Body mass index is 32.44 kg/m. GENERAL:  Well appearing.  No acute distress. HEENT: Pupils equal round and reactive, fundi not visualized, oral mucosa unremarkable NECK:  No jugular venous distention, waveform within normal limits, carotid upstroke brisk and symmetric, no bruits LUNGS:  Clear to auscultation bilaterally HEART:  RRR.  PMI not displaced or sustained,S1 and S2 within normal limits, no S3, no S4, no clicks, no rubs, no murmurs ABD:  Flat, positive bowel sounds normal in frequency in pitch, no bruits, no rebound, no guarding, no midline pulsatile mass, no hepatomegaly, no splenomegaly EXT:  2 plus pulses throughout.  Tense, 1+ pitting edema To the lower tibia bilaterally with associated venous stasis dermatitis.  no cyanosis no clubbing SKIN:  No rashes no nodules NEURO:  Cranial nerves II through XII grossly intact, motor grossly intact throughout PSYCH:  Cognitively intact, oriented to person place and time  EKG:  EKG is ordered today. 02/16/16: Sinus rhythm rate 83 bpm.  PAC.  RBBB.  LPFB. 03/02/17: Sinus rhythm.  RBBB.  LPFB  Exercise Myoview 04/30/15  The left ventricular ejection fraction is hyperdynamic (>65%).  Nuclear stress EF:  78%.  Blood pressure demonstrated a hypertensive response to exercise.  Upsloping ST segment depression ST segment depression was noted during stress in the III, II and aVF leads. This is not diagnostic for ischemia.  Defect 1: There is a small defect of moderate severity present in the mid anterior and apical anterior location. Wall motion is normal, so this is likely artifact due to breast attenuation. However, cannot rule out LAD ischemia.  This is a low risk study.  Cardiac cath 05/26/15:  Mid LAD lesion, 50% stenosed. FFR of this lesion showed value of 0.85. This is felt to be nonsignificant.  Normal LVEDP.  Continue aggressive medical therapy. Would consider adding long-acting nitrate as the patient may have some component of vasospasm. Of note, in the future , if repeat catheterization was needed , would consider using a 5 [place] guide catheter as her left main is very short and the catheter tends to deep seat Into either the LAD or the circumflex.  Recent Labs: 08/17/2016: ALT 41; BUN 22; Creat 1.12; Potassium 4.6; Sodium 142   03/12/15: INR 2.9   Lipid Panel    Component Value Date/Time   CHOL 215 (H) 08/17/2016 0914   CHOL 176 02/23/2016 0748   TRIG 164 (H) 08/17/2016 0914   HDL 44 (L) 08/17/2016 0914   HDL 44 02/23/2016 0748   CHOLHDL 4.9 08/17/2016 0914   VLDL 33 (H) 08/17/2016 0914   LDLCALC 138 (H) 08/17/2016 0914   LDLCALC 99 02/23/2016 0748      Wt Readings from Last 3 Encounters:  03/02/17 81.1 kg (178 lb 12.8 oz)  08/17/16 82.5 kg (181 lb 12.8 oz)  02/16/16 84.3 kg (185 lb 12.8 oz)      ASSESSMENT AND PLAN:  # Non-obstructive CAD: 50% LAD obstruction.  FFR was within normal limits.  However there was some concern for coronary spasms.  She has not had any chest pain recently. Continue carvedilol and Imdur.  She is not on aspirin Because she takes warfarin.  #  Hyperlipidemia: Ms. Bankhead hasn't been able to tolerate pravastatin or rosuvastatin and has  known coronary artery disease.  Status on Repatha approval is unknown.  We will follow up.   # Chronic diastolic heart failure: Ms. Greenley is mildly volume overloaded.  Continue lasix daily with an extra dose as needed for worsening edema or shortness of breath.  # Hypertension: BP is well-controlled. Continue carvedilol, losartan, Imdur and furosemide.    Current medicines are reviewed at length with the patient today.  The patient does not have concerns regarding medicines.  The following changes have been made:  no change  Labs/ tests ordered today include:   Orders Placed This Encounter  Procedures  . Basic metabolic panel  . Lipid panel     Disposition:   FU with Tearah Saulsbury C. Oval Linsey, MD in 6 months.  Pharmacy in 2 weeks.   Signed, Skeet Latch, MD  03/02/2017 5:52 PM    [facility]

## 2017-03-02 NOTE — Patient Instructions (Addendum)
Medication Instructions:  OK TO USE EXTRA FUROSEMIDE DAILY AS NEEDED FOR SWELLING  Labwork: FASTING LP/BMET NEXT WEEK   Testing/Procedures: NONE  Follow-Up: Your physician wants you to follow-up in: [place] will receive a reminder letter in the mail two months in advance. If you don't receive a letter, please call our office to schedule the follow-up appointment.  If you need a refill on your cardiac medications before your next appointment, please call your pharmacy.

## 2017-03-04 ENCOUNTER — Other Ambulatory Visit: Payer: Self-pay | Admitting: Pharmacist Clinician (PhC)/ Clinical Pharmacy Specialist

## 2017-03-04 MED ORDER — EVOLOCUMAB 140 MG/ML ~~LOC~~ SOAJ: 140.0000 mg | SUBCUTANEOUS | 3 refills | Status: DC

## 2017-03-08 DIAGNOSIS — E782 Mixed hyperlipidemia: Secondary | ICD-10-CM | POA: Diagnosis not present

## 2017-03-08 DIAGNOSIS — E784 Other hyperlipidemia: Secondary | ICD-10-CM | POA: Diagnosis not present

## 2017-03-08 DIAGNOSIS — Z79899 Other long term (current) drug therapy: Secondary | ICD-10-CM | POA: Diagnosis not present

## 2017-03-08 LAB — LIPID PANEL
CHOL/HDL RATIO: 5.6 ratio — AB (ref 0.0–4.4)
Cholesterol, Total: 242 mg/dL — ABNORMAL HIGH (ref 100–199)
HDL: 43 mg/dL (ref >39)
LDL CALC: 159 mg/dL — AB (ref 0–99)
Triglycerides: 201 mg/dL — ABNORMAL HIGH (ref 0–149)
VLDL CHOLESTEROL CAL: 40 mg/dL (ref 5–40)

## 2017-03-08 LAB — BASIC METABOLIC PANEL
BUN/Creatinine Ratio: 16 (ref 12–28)
BUN: 17 mg/dL (ref 8–27)
CALCIUM: 10 mg/dL (ref 8.7–10.3)
CO2: 27 mmol/L (ref 20–29)
CREATININE: 1.05 mg/dL — AB (ref 0.57–1.00)
Chloride: 100 mmol/L (ref 96–106)
GFR calc Af Amer: 60 mL/min/{1.73_m2} (ref >59)
GFR calc non Af Amer: 52 mL/min/{1.73_m2} — ABNORMAL LOW (ref >59)
GLUCOSE: 161 mg/dL — AB (ref 65–99)
Potassium: 4.7 mmol/L (ref 3.5–5.2)
Sodium: 142 mmol/L (ref 134–144)

## 2017-03-30 DIAGNOSIS — E119 Type 2 diabetes mellitus without complications: Secondary | ICD-10-CM | POA: Diagnosis not present

## 2017-04-13 ENCOUNTER — Telehealth: Payer: Self-pay | Admitting: Cardiovascular Disease

## 2017-04-13 NOTE — Telephone Encounter (Signed)
New message  Pt returning call about insurance. Please call back to discuss

## 2017-04-13 NOTE — Telephone Encounter (Signed)
talked to patient today. [employer] application completed and faxed.

## 2017-04-13 NOTE — Telephone Encounter (Signed)
SPOKE TO PATIENT. SHE STATES SHE HAS THE INFORMATION THAT PHARMACIST NEEDED. WILL DEFER.

## 2017-04-26 DIAGNOSIS — Z7901 Long term (current) use of anticoagulants: Secondary | ICD-10-CM | POA: Diagnosis not present

## 2017-05-03 ENCOUNTER — Other Ambulatory Visit: Payer: Self-pay | Admitting: Pharmacist Clinician (PhC)/ Clinical Pharmacy Specialist

## 2017-05-03 DIAGNOSIS — Z7901 Long term (current) use of anticoagulants: Secondary | ICD-10-CM | POA: Diagnosis not present

## 2017-05-03 MED ORDER — CARVEDILOL 6.25 MG PO TABS: 6.2500 mg | ORAL_TABLET | Freq: Two times a day (BID) | ORAL | 2 refills | Status: DC

## 2017-05-09 DIAGNOSIS — J019 Acute sinusitis, unspecified: Secondary | ICD-10-CM | POA: Diagnosis not present

## 2017-05-09 DIAGNOSIS — H6993 Unspecified Eustachian tube disorder, bilateral: Secondary | ICD-10-CM | POA: Diagnosis not present

## 2017-05-09 DIAGNOSIS — B349 Viral infection, unspecified: Secondary | ICD-10-CM | POA: Diagnosis not present

## 2017-05-09 DIAGNOSIS — Z1389 Encounter for screening for other disorder: Secondary | ICD-10-CM | POA: Diagnosis not present

## 2017-05-11 DIAGNOSIS — D34 Benign neoplasm of thyroid gland: Secondary | ICD-10-CM | POA: Diagnosis not present

## 2017-05-11 DIAGNOSIS — E042 Nontoxic multinodular goiter: Secondary | ICD-10-CM | POA: Diagnosis not present

## 2017-05-17 DIAGNOSIS — D249 Benign neoplasm of unspecified breast: Secondary | ICD-10-CM | POA: Diagnosis not present

## 2017-05-17 DIAGNOSIS — M5030 Other cervical disc degeneration, unspecified cervical region: Secondary | ICD-10-CM | POA: Diagnosis not present

## 2017-05-17 DIAGNOSIS — I10 Essential (primary) hypertension: Secondary | ICD-10-CM | POA: Diagnosis not present

## 2017-05-17 DIAGNOSIS — E042 Nontoxic multinodular goiter: Secondary | ICD-10-CM | POA: Diagnosis not present

## 2017-05-24 DIAGNOSIS — Z7901 Long term (current) use of anticoagulants: Secondary | ICD-10-CM | POA: Diagnosis not present

## 2017-06-03 DIAGNOSIS — Z7901 Long term (current) use of anticoagulants: Secondary | ICD-10-CM | POA: Diagnosis not present

## 2017-06-09 ENCOUNTER — Other Ambulatory Visit: Payer: Self-pay | Admitting: Pharmacist Clinician (PhC)/ Clinical Pharmacy Specialist

## 2017-06-09 MED ORDER — ISOSORBIDE MONONITRATE ER 30 MG PO TB24: 30.0000 mg | ORAL_TABLET | Freq: Every day | ORAL | 3 refills | Status: DC

## 2017-07-20 DIAGNOSIS — D485 Neoplasm of uncertain behavior of skin: Secondary | ICD-10-CM | POA: Diagnosis not present

## 2017-07-20 DIAGNOSIS — L820 Inflamed seborrheic keratosis: Secondary | ICD-10-CM | POA: Diagnosis not present

## 2017-07-20 DIAGNOSIS — D1800 Hemangioma unspecified site: Secondary | ICD-10-CM | POA: Diagnosis not present

## 2017-07-20 DIAGNOSIS — L570 Actinic keratosis: Secondary | ICD-10-CM | POA: Diagnosis not present

## 2017-08-15 DIAGNOSIS — H401112 Primary open-angle glaucoma, right eye, moderate stage: Secondary | ICD-10-CM | POA: Diagnosis not present

## 2017-08-15 DIAGNOSIS — H401121 Primary open-angle glaucoma, left eye, mild stage: Secondary | ICD-10-CM | POA: Diagnosis not present

## 2017-08-23 DIAGNOSIS — Z7901 Long term (current) use of anticoagulants: Secondary | ICD-10-CM | POA: Diagnosis not present

## 2017-08-26 DIAGNOSIS — E119 Type 2 diabetes mellitus without complications: Secondary | ICD-10-CM | POA: Diagnosis not present

## 2017-08-26 DIAGNOSIS — E782 Mixed hyperlipidemia: Secondary | ICD-10-CM | POA: Diagnosis not present

## 2017-08-26 DIAGNOSIS — E039 Hypothyroidism, unspecified: Secondary | ICD-10-CM | POA: Diagnosis not present

## 2017-08-26 DIAGNOSIS — I10 Essential (primary) hypertension: Secondary | ICD-10-CM | POA: Diagnosis not present

## 2017-08-26 DIAGNOSIS — E1165 Type 2 diabetes mellitus with hyperglycemia: Secondary | ICD-10-CM | POA: Diagnosis not present

## 2017-08-26 DIAGNOSIS — Z1389 Encounter for screening for other disorder: Secondary | ICD-10-CM | POA: Diagnosis not present

## 2017-09-12 ENCOUNTER — Other Ambulatory Visit: Payer: Self-pay | Admitting: Cardiovascular Disease

## 2017-09-12 DIAGNOSIS — Z7901 Long term (current) use of anticoagulants: Secondary | ICD-10-CM | POA: Diagnosis not present

## 2017-09-12 NOTE — Telephone Encounter (Signed)
Please review for refill. Thanks!  

## 2017-09-13 DIAGNOSIS — Z7901 Long term (current) use of anticoagulants: Secondary | ICD-10-CM | POA: Diagnosis not present

## 2017-09-15 ENCOUNTER — Encounter: Payer: Self-pay | Admitting: Cardiovascular Disease

## 2017-09-15 ENCOUNTER — Ambulatory Visit: Payer: Medicare Other | Admitting: Cardiovascular Disease

## 2017-09-15 VITALS — BP 138/74 | HR 64 | Ht 62.0 in | Wt 178.4 lb

## 2017-09-15 DIAGNOSIS — I5032 Chronic diastolic (congestive) heart failure: Secondary | ICD-10-CM | POA: Diagnosis not present

## 2017-09-15 DIAGNOSIS — I10 Essential (primary) hypertension: Secondary | ICD-10-CM

## 2017-09-15 DIAGNOSIS — I2510 Atherosclerotic heart disease of native coronary artery without angina pectoris: Secondary | ICD-10-CM

## 2017-09-15 DIAGNOSIS — I2583 Coronary atherosclerosis due to lipid rich plaque: Secondary | ICD-10-CM | POA: Diagnosis not present

## 2017-09-15 DIAGNOSIS — R609 Edema, unspecified: Secondary | ICD-10-CM | POA: Diagnosis not present

## 2017-09-15 MED ORDER — POTASSIUM CHLORIDE CRYS ER 20 MEQ PO TBCR: 10.0000 meq | EXTENDED_RELEASE_TABLET | Freq: Every day | ORAL | 3 refills | Status: DC

## 2017-09-15 NOTE — Progress Notes (Signed)
Cardiology Office Note   Date:  09/15/2017   ID:  [name], DOB [birth year], MRN [id number]  PCP:  Sharilyn Sites, MD  Cardiologist:   Skeet Latch, MD   Chief Complaint  Patient presents with  . Follow-up    pt c/o chest pain but she think it alot of gas     History of Present Illness: [name] is a 77 y.o. female with CAD (50% LAD), chronic diastolic heart failure (grade 2), hypertension, hyperlipidemia, prior DVT on warfarin, and diabetes type 2 who presents for follow up.  She was first seen 04/2015 at which time she reported occasional chest pain.  She had an exercise Myoview 04/2015 that showed LVEF 78% with a small defect of moderate severity in the mid anterior and apical anterior region.  This was felt to be due to breast attenuation artifact.  However, she underwent cardiac catheterization on 05/26/15 that revealed a 50% LAD lesion. There was concern that there may be a component of vasospasm so long-acting nitrates were started. Her blood pressure and hyperlipidemia medications have been titrated due to poor control.  She is also on Lasix due to lower extremity edema.  Since her last appointment [name] has been doing well.  She has been limited by back pain.  She had to stop some of her neuropathic pain medications because they were affecting her thoughts.  She has not been getting any exercise due to his pain.  She is also noticed worsening in her memory.  She has not started Repatha.  She is unsure of why she did not receive it.  She has not expands any chest pain or shortness of breath.  She sometimes has worsening of her lower extremity edema that seems to occur especially when the weather is warm.  She notes that both her blood sugars and her INRs have been labile.  She continues to work with her PCP on this.   Past Medical History:  Diagnosis Date  . Antral gastritis    EGD 11/15  . Asthmatic bronchitis   . Back pain   . Chronic diastolic heart  failure (HCC) 05/20/2015   Grade 2 diastolic dysfunction.  04/2015.  [name] Chronic kidney disease    kidney function low  . Diabetes mellitus    x 5 yrs  . DVT of axillary vein, acute left ([place]) 07/24/12  . GERD (gastroesophageal reflux disease)   . Glaucoma   . Hyperlipidemia 05/20/2015  . Hypertension   . Hypothyroidism   . Kidney stones   . Mixed hyperlipidemia   . Peripheral venous insufficiency   . Pinched nerve    right elbow  . Sigmoid diverticulitis   . Vertigo    chonic    Past Surgical History:  Procedure Laterality Date  . ABDOMINAL HYSTERECTOMY    . BACK SURGERY     spinal   . CARDIAC CATHETERIZATION N/A 05/26/2015   Procedure: Left Heart Cath and Coronary Angiography;  Surgeon: Jettie Booze, MD;  Location: [facility];  Service: Cardiovascular;  Laterality: N/A;  . CHOLECYSTECTOMY    . COLON SURGERY    . COLONOSCOPY N/A 12/27/2013   Procedure: COLONOSCOPY;  Surgeon: Rogene Houston, MD;  [facility];  Service: Endoscopy;  Laterality: N/A;  200  . COLOSTOMY CLOSURE    . ESOPHAGOGASTRODUODENOSCOPY N/A 05/07/2014   Procedure: ESOPHAGOGASTRODUODENOSCOPY (EGD);  Surgeon: Rogene Houston, MD;  [facility];  Service: Endoscopy;  Laterality:  N/A;  . EYE SURGERY    . fracture left foot    . HERNIA REPAIR    . NM MYOCAR PERF WALL MOTION  01/28/2009   Normal  . OTHER SURGICAL HISTORY     colostomy, colostomy reversal, for diverticulitis surgical hernia repair, arm surgery, neck surgery  . US ECHOCARDIOGRAPHY  02/11/2010   Mild MR,trace TR & AI     Current Outpatient Medications  Medication Sig Dispense Refill  . albuterol (PROVENTIL HFA;VENTOLIN HFA) 108 (90 BASE) MCG/ACT inhaler Inhale 2 puffs into the lungs every 4 (four) hours as needed for shortness of breath. 1 Inhaler 0  . allopurinol (ZYLOPRIM) 100 MG tablet Take 100 mg by mouth daily as needed.     [name] amitriptyline (ELAVIL) 25 MG tablet Take 50 mg by mouth at bedtime.     .  Black Cohosh 40 MG CAPS Take 40 mg by mouth 2 (two) times daily.    . carvedilol (COREG) 6.25 MG tablet Take 1 tablet (6.25 mg total) by mouth 2 (two) times daily. 180 tablet 2  . cetirizine (ZYRTEC) 10 MG tablet Take 10 mg by mouth daily.    . Cholecalciferol (VITAMIN D-3) 1000 units CAPS Take 1 capsule by mouth daily.    [name] esomeprazole (NEXIUM) 20 MG capsule Take 20 mg by mouth daily at 12 noon.    . Evolocumab (REPATHA SURECLICK) 161 MG/ML SOAJ Inject 140 mg into the skin every 14 (fourteen) days. 6 pen 3  . furosemide (LASIX) 40 MG tablet TAKE 1 TABLET (40 MG TOTAL) BY MOUTH DAILY. MAY TAKE EXTRA DAILY AS NEEDED FOR SWELLING 45 tablet 5  . glimepiride (AMARYL) 2 MG tablet Take 2 mg by mouth daily.  2  . isosorbide mononitrate (IMDUR) 30 MG 24 hr tablet Take 1 tablet (30 mg total) by mouth daily. 90 tablet 3  . latanoprost (XALATAN) 0.005 % ophthalmic solution Place 1 drop into both eyes at bedtime.     [name] losartan (COZAAR) 50 MG tablet Take 25 mg by mouth daily.    . meclizine (ANTIVERT) 25 MG tablet Take 25 mg by mouth as needed for dizziness.     . Omega-3 Fatty Acids (FISH OIL) 1200 MG CAPS Take 1 capsule by mouth 2 (two) times daily.    . polyethylene glycol (MIRALAX / GLYCOLAX) packet Take 17 g by mouth daily.    . potassium chloride SA (K-DUR,KLOR-CON) 20 MEQ tablet Take 10 mEq by mouth daily.     . sitaGLIPtin (JANUVIA) 50 MG tablet Take 1 tablet by mouth daily.    [name] tetrahydrozoline 0.05 % ophthalmic solution Place 1 drop into both eyes at bedtime.    . vitamin B-12 (CYANOCOBALAMIN) 100 MCG tablet Take 1 tablet by mouth daily.    [name] warfarin (COUMADIN) 1 MG tablet Take 1 mg by mouth daily.  11   No current facility-administered medications for this visit.     Allergies:   Vioxx [rofecoxib]; Metformin and related; Penicillins; Pravastatin; Codeine; Motrin [ibuprofen]; and Sulfur    Social History:  The patient  reports that she quit smoking about 3 years ago. Her smoking use  included cigarettes. she has never used smokeless tobacco. She reports that she does not drink alcohol or use drugs.   Family History:  The patient's family history includes CVA (age of onset: 53) in her maternal grandmother; Cancer in her mother; Cancer (age of onset: 71) in her sister; Heart attack (age of onset: 70) in her brother; Heart disease  in her maternal grandmother; Other (age of onset: 78) in her mother; Stroke in her maternal grandmother.    ROS:  Please see the history of present illness.   Otherwise, review of systems are positive for hiatal hernia, GERD    All other systems are reviewed and negative.    PHYSICAL EXAM: VS:  BP 138/74   Pulse 64   Ht 5\' 2"  (1.575 m)   Wt 178 lb 6.4 oz (80.9 kg)   BMI 32.63 kg/m  , BMI Body mass index is 32.63 kg/m. GENERAL:  Well appearing HEENT: Pupils equal round and reactive, fundi not visualized, oral mucosa unremarkable NECK:  No jugular venous distention, waveform within normal limits, carotid upstroke brisk and symmetric, no bruits LUNGS:  Clear to auscultation bilaterally HEART:  RRR.  PMI not displaced or sustained,S1 and S2 within normal limits, no S3, no S4, no clicks, no rubs, no murmurs ABD:  Flat, positive bowel sounds normal in frequency in pitch, no bruits, no rebound, no guarding, no midline pulsatile mass, no hepatomegaly, no splenomegaly EXT:  2 plus pulses throughout, trace edema, no cyanosis no clubbing SKIN:  No rashes no nodules NEURO:  Cranial nerves II through XII grossly intact, motor grossly intact throughout PSYCH:  Cognitively intact, oriented to person place and time   EKG:  EKG is ordered today. 02/16/16: Sinus rhythm rate 83 bpm.  PAC.  RBBB.  LPFB. 03/02/17: Sinus rhythm.  RBBB.  LPFB 09/15/17: Sinus rhythm.  Rate 64 bpm. RBBB   Exercise Myoview 04/30/15  The left ventricular ejection fraction is hyperdynamic (>65%).  Nuclear stress EF: 78%.  Blood pressure demonstrated a hypertensive response to  exercise.  Upsloping ST segment depression ST segment depression was noted during stress in the III, II and aVF leads. This is not diagnostic for ischemia.  Defect 1: There is a small defect of moderate severity present in the mid anterior and apical anterior location. Wall motion is normal, so this is likely artifact due to breast attenuation. However, cannot rule out LAD ischemia.  This is a low risk study.  Cardiac cath 05/26/15:  Mid LAD lesion, 50% stenosed. FFR of this lesion showed value of 0.85. This is felt to be nonsignificant.  Normal LVEDP.  Continue aggressive medical therapy. Would consider adding long-acting nitrate as the patient may have some component of vasospasm. Of note, in the future , if repeat catheterization was needed , would consider using a 5 [place] guide catheter as her left main is very short and the catheter tends to deep seat Into either the LAD or the circumflex.  Recent Labs: 03/08/2017: BUN 17; Creatinine, Ser 1.05; Potassium 4.7; Sodium 142   03/12/15: INR 2.9   Lipid Panel    Component Value Date/Time   CHOL 242 (H) 03/08/2017 0953   TRIG 201 (H) 03/08/2017 0953   HDL 43 03/08/2017 0953   CHOLHDL 5.6 (H) 03/08/2017 0953   CHOLHDL 4.9 08/17/2016 0914   VLDL 33 (H) 08/17/2016 0914   LDLCALC 159 (H) 03/08/2017 0953      Wt Readings from Last 3 Encounters:  09/15/17 178 lb 6.4 oz (80.9 kg)  03/02/17 178 lb 12.8 oz (81.1 kg)  08/17/16 181 lb 12.8 oz (82.5 kg)      ASSESSMENT AND PLAN:  # Non-obstructive CAD:  # Hyperlipidemia: 50% LAD obstruction.  FFR was within normal limits.  However there was some concern for coronary spasms.  She is stable.  Continue Imdur and carvedilol.  Unclear why  she hasn't started Repatha.  We will check with PharmD.    # Chronic diastolic heart failure: Stable.  Continue lasix, carvedilol, and losaratn.   # Hypertension: BP is mildly above goal but she reports orthostasis and hasn't taken her meds yet.   Continue carvedilol, losartan, Imdur and furosemide.    Current medicines are reviewed at length with the patient today.  The patient does not have concerns regarding medicines.  The following changes have been made:  no change  Labs/ tests ordered today include:   No orders of the defined types were placed in this encounter.    Disposition:   FU with Jimmie Dattilio C. Oval Linsey, MD in 6 months.     Signed, Skeet Latch, MD  09/15/2017 8:39 AM    [facility]

## 2017-09-15 NOTE — Patient Instructions (Signed)
Medication Instructions: Your physician recommends that you continue on your current medications as directed. Please refer to the Current Medication list given to you today.  If you need a refill on your cardiac medications before your next appointment, please call your pharmacy.    Follow-Up: Your physician wants you to follow-up in 6 months with Dr. Oval Linsey. You will receive a reminder letter in the mail two months in advance. If you don't receive a letter, please call our office at [phone] to schedule this follow-up appointment.   Thank you for choosing [facility] at [facility]!!

## 2017-09-20 DIAGNOSIS — Z7901 Long term (current) use of anticoagulants: Secondary | ICD-10-CM | POA: Diagnosis not present

## 2017-09-27 DIAGNOSIS — Z7901 Long term (current) use of anticoagulants: Secondary | ICD-10-CM | POA: Diagnosis not present

## 2017-09-30 DIAGNOSIS — Z7901 Long term (current) use of anticoagulants: Secondary | ICD-10-CM | POA: Diagnosis not present

## 2017-09-30 DIAGNOSIS — J209 Acute bronchitis, unspecified: Secondary | ICD-10-CM | POA: Diagnosis not present

## 2017-09-30 DIAGNOSIS — J449 Chronic obstructive pulmonary disease, unspecified: Secondary | ICD-10-CM | POA: Diagnosis not present

## 2017-10-07 ENCOUNTER — Other Ambulatory Visit: Payer: Self-pay | Admitting: Obstetrics and Gynecology

## 2017-10-07 DIAGNOSIS — Z1231 Encounter for screening mammogram for malignant neoplasm of breast: Secondary | ICD-10-CM

## 2017-10-12 DIAGNOSIS — Z7901 Long term (current) use of anticoagulants: Secondary | ICD-10-CM | POA: Diagnosis not present

## 2017-10-13 DIAGNOSIS — Z7901 Long term (current) use of anticoagulants: Secondary | ICD-10-CM | POA: Diagnosis not present

## 2017-10-14 DIAGNOSIS — Z7901 Long term (current) use of anticoagulants: Secondary | ICD-10-CM | POA: Diagnosis not present

## 2017-10-17 DIAGNOSIS — Z7901 Long term (current) use of anticoagulants: Secondary | ICD-10-CM | POA: Diagnosis not present

## 2017-10-19 ENCOUNTER — Ambulatory Visit (HOSPITAL_COMMUNITY)
Admission: RE | Admit: 2017-10-19 | Discharge: 2017-10-19 | Disposition: A | Discharge status: Home / Self Care | Payer: Medicare Other | Source: Ambulatory Visit | Attending: Obstetrics and Gynecology | Admitting: Obstetrics and Gynecology

## 2017-10-19 DIAGNOSIS — Z1231 Encounter for screening mammogram for malignant neoplasm of breast: Secondary | ICD-10-CM | POA: Diagnosis not present

## 2017-10-26 DIAGNOSIS — Z7901 Long term (current) use of anticoagulants: Secondary | ICD-10-CM | POA: Diagnosis not present

## 2017-11-04 DIAGNOSIS — Z7901 Long term (current) use of anticoagulants: Secondary | ICD-10-CM | POA: Diagnosis not present

## 2017-11-14 ENCOUNTER — Ambulatory Visit: Payer: Medicare Other | Admitting: Obstetrics and Gynecology

## 2017-11-14 ENCOUNTER — Encounter: Payer: Self-pay | Admitting: Obstetrics and Gynecology

## 2017-11-14 VITALS — BP 138/80 | HR 76 | Ht 62.0 in | Wt 176.2 lb

## 2017-11-14 DIAGNOSIS — R232 Flushing: Secondary | ICD-10-CM

## 2017-11-14 DIAGNOSIS — Z01419 Encounter for gynecological examination (general) (routine) without abnormal findings: Secondary | ICD-10-CM

## 2017-11-14 DIAGNOSIS — Z01411 Encounter for gynecological examination (general) (routine) with abnormal findings: Secondary | ICD-10-CM

## 2017-11-14 DIAGNOSIS — R1031 Right lower quadrant pain: Secondary | ICD-10-CM | POA: Diagnosis not present

## 2017-11-14 NOTE — Progress Notes (Addendum)
Assessment:  Annual Gyn Exam  Plan:  1. pap smear done, next pap due in 3 years 2. return annually or prn 3    Annual mammogram advised after age 77 Subjective:  [name] is a 77 y.o. female G3P3 who presents for a gyn exam. No LMP recorded. Patient has had a hysterectomy. with ovary preservation.  The patient has complaints today of hot sweats. She goes to [employer] to get 'Flash-E' to control them, however she ran out and has been experiencing flashes today. She notes occasional RLQ pain, and notes that is where she had an ovarian cyst burst. HIstory of multiple abd surgeries and abd wall hernia. Wears a abd binder.  She takes miralax and has no issues with bowel movements. She had a mammogram a few weeks ago,   The following portions of the patient's history were reviewed and updated as appropriate: allergies, current medications, past family history, past medical history, past social history, past surgical history and problem list. Past Medical History:  Diagnosis Date  . Antral gastritis    EGD 11/15  . Asthmatic bronchitis   . Back pain   . Chronic diastolic heart failure (HCC) 05/20/2015   Grade 2 diastolic dysfunction.  04/2015.  [name] Chronic kidney disease    kidney function low  . Diabetes mellitus    x 5 yrs  . DVT of axillary vein, acute left ([place]) 07/24/12  . GERD (gastroesophageal reflux disease)   . Glaucoma   . Hyperlipidemia 05/20/2015  . Hypertension   . Hypothyroidism   . Kidney stones   . Mixed hyperlipidemia   . Peripheral venous insufficiency   . Pinched nerve    right elbow  . Sigmoid diverticulitis   . Vertigo    chonic    Past Surgical History:  Procedure Laterality Date  . ABDOMINAL HYSTERECTOMY    . BACK SURGERY     spinal   . CARDIAC CATHETERIZATION N/A 05/26/2015   Procedure: Left Heart Cath and Coronary Angiography;  Surgeon: Jettie Booze, MD;  Location: [place] [facility];  Service: Cardiovascular;  Laterality: N/A;  .  CHOLECYSTECTOMY    . COLON SURGERY    . COLONOSCOPY N/A 12/27/2013   Procedure: COLONOSCOPY;  Surgeon: Rogene Houston, MD;  [facility];  Service: Endoscopy;  Laterality: N/A;  200  . COLOSTOMY CLOSURE    . ESOPHAGOGASTRODUODENOSCOPY N/A 05/07/2014   Procedure: ESOPHAGOGASTRODUODENOSCOPY (EGD);  Surgeon: Rogene Houston, MD;  [facility];  Service: Endoscopy;  Laterality: N/A;  . EYE SURGERY    . fracture left foot    . HERNIA REPAIR    . NM MYOCAR PERF WALL MOTION  01/28/2009   Normal  . OTHER SURGICAL HISTORY     colostomy, colostomy reversal, for diverticulitis surgical hernia repair, arm surgery, neck surgery  . US ECHOCARDIOGRAPHY  02/11/2010   Mild MR,trace TR & AI     Current Outpatient Medications:  .  albuterol (PROVENTIL HFA;VENTOLIN HFA) 108 (90 BASE) MCG/ACT inhaler, Inhale 2 puffs into the lungs every 4 (four) hours as needed for shortness of breath., Disp: 1 Inhaler, Rfl: 0 .  allopurinol (ZYLOPRIM) 100 MG tablet, Take 100 mg by mouth daily as needed. , Disp: , Rfl:  .  amitriptyline (ELAVIL) 25 MG tablet, Take 50 mg by mouth at bedtime. , Disp: , Rfl:  .  Black Cohosh 40 MG CAPS, Take 40 mg by mouth 2 (two) times daily., Disp: , Rfl:  .  carvedilol (COREG) 6.25 MG tablet, Take 1 tablet (6.25 mg total) by mouth 2 (two) times daily., Disp: 180 tablet, Rfl: 2 .  cetirizine (ZYRTEC) 10 MG tablet, Take 10 mg by mouth daily., Disp: , Rfl:  .  Cholecalciferol (VITAMIN D-3) 1000 units CAPS, Take 1 capsule by mouth daily., Disp: , Rfl:  .  furosemide (LASIX) 40 MG tablet, TAKE 1 TABLET (40 MG TOTAL) BY MOUTH DAILY. MAY TAKE EXTRA DAILY AS NEEDED FOR SWELLING, Disp: 45 tablet, Rfl: 5 .  isosorbide mononitrate (IMDUR) 30 MG 24 hr tablet, Take 1 tablet (30 mg total) by mouth daily., Disp: 90 tablet, Rfl: 3 .  latanoprost (XALATAN) 0.005 % ophthalmic solution, Place 1 drop into both eyes at bedtime. , Disp: , Rfl:  .  meclizine (ANTIVERT) 25 MG tablet, Take 25 mg  by mouth as needed for dizziness. , Disp: , Rfl:  .  polyethylene glycol (MIRALAX / GLYCOLAX) packet, Take 17 g by mouth daily., Disp: , Rfl:  .  potassium chloride SA (K-DUR,KLOR-CON) 20 MEQ tablet, Take 0.5 tablets (10 mEq total) by mouth daily., Disp: 90 tablet, Rfl: 3 .  warfarin (COUMADIN) 1 MG tablet, Take 1 mg by mouth daily., Disp: , Rfl: 11 .  esomeprazole (NEXIUM) 20 MG capsule, Take 20 mg by mouth daily at 12 noon., Disp: , Rfl:  .  Evolocumab (REPATHA SURECLICK) 782 MG/ML SOAJ, Inject 140 mg into the skin every 14 (fourteen) days. (Patient not taking: Reported on 11/14/2017), Disp: 6 pen, Rfl: 3 .  glimepiride (AMARYL) 2 MG tablet, Take 2 mg by mouth daily., Disp: , Rfl: 2 .  losartan (COZAAR) 50 MG tablet, Take 25 mg by mouth daily., Disp: , Rfl:  .  Omega-3 Fatty Acids (FISH OIL) 1200 MG CAPS, Take 1 capsule by mouth 2 (two) times daily., Disp: , Rfl:  .  sitaGLIPtin (JANUVIA) 50 MG tablet, Take 1 tablet by mouth daily., Disp: , Rfl:  .  tetrahydrozoline 0.05 % ophthalmic solution, Place 1 drop into both eyes at bedtime., Disp: , Rfl:  .  vitamin B-12 (CYANOCOBALAMIN) 100 MCG tablet, Take 1 tablet by mouth daily., Disp: , Rfl:   Review of Systems Constitutional: negative Gastrointestinal: negative Genitourinary: negative  Objective:  BP 138/80 (BP Location: Left Arm, Patient Position: Sitting, Cuff Size: Normal)   Pulse 76   Ht 5\' 2"  (1.575 m)   Wt 176 lb 3.2 oz (79.9 kg)   BMI 32.23 kg/m    BMI: Body mass index is 32.23 kg/m.  General Appearance: Alert, appropriate appearance for age. No acute distress HEENT: Grossly normal Neck / Thyroid:  Cardiovascular: Lungs:  Back: Breast Exam: No masses or nodes.No dimpling, nipple retraction or discharge. Gastrointestinal: Soft, non-tender, no masses or organomegaly midline scar entire abdomen, Marked obesity. Pelvic Exam:  External genitalia: normal general appearance Vaginal: normal mucosa without prolapse or lesions, good  support Exam is limited Cervix: surgically removed Adnexa: normal bimanual exam Uterus: surgically removed Rectovaginal: normal rectal, no masses and guaiac negative stool obtained Lymphatic Exam: Non-palpable nodes in neck, clavicular, axillary, or inguinal regions  Skin: no rash or abnormalities Neurologic: Normal gait and speech, no tremor  Psychiatric: Alert and oriented, appropriate affect.  Urinalysis:Not done   By signing my name below, I, Izna Ahmed, attest that this documentation has been prepared under the direction and in the presence of [name], MD. Electronically Signed: Jabier Gauss, Medical Scribe. 11/14/17. 11:19 AM.  I personally performed the services described in this documentation,  which was SCRIBED in my presence. The recorded information has been reviewed and considered accurate. It has been edited as necessary during review. [name], MD

## 2017-11-18 DIAGNOSIS — Z7901 Long term (current) use of anticoagulants: Secondary | ICD-10-CM | POA: Diagnosis not present

## 2017-11-22 DIAGNOSIS — Z7901 Long term (current) use of anticoagulants: Secondary | ICD-10-CM | POA: Diagnosis not present

## 2017-11-29 DIAGNOSIS — Z7901 Long term (current) use of anticoagulants: Secondary | ICD-10-CM | POA: Diagnosis not present

## 2017-11-30 DIAGNOSIS — L280 Lichen simplex chronicus: Secondary | ICD-10-CM | POA: Diagnosis not present

## 2017-11-30 DIAGNOSIS — L570 Actinic keratosis: Secondary | ICD-10-CM | POA: Diagnosis not present

## 2017-12-07 DIAGNOSIS — Z0001 Encounter for general adult medical examination with abnormal findings: Secondary | ICD-10-CM | POA: Diagnosis not present

## 2017-12-07 DIAGNOSIS — E119 Type 2 diabetes mellitus without complications: Secondary | ICD-10-CM | POA: Diagnosis not present

## 2017-12-07 DIAGNOSIS — Z1389 Encounter for screening for other disorder: Secondary | ICD-10-CM | POA: Diagnosis not present

## 2017-12-07 DIAGNOSIS — E7849 Other hyperlipidemia: Secondary | ICD-10-CM | POA: Diagnosis not present

## 2017-12-07 DIAGNOSIS — Z7901 Long term (current) use of anticoagulants: Secondary | ICD-10-CM | POA: Diagnosis not present

## 2017-12-07 DIAGNOSIS — H409 Unspecified glaucoma: Secondary | ICD-10-CM | POA: Diagnosis not present

## 2017-12-16 ENCOUNTER — Telehealth: Payer: Self-pay | Admitting: Pharmacist Clinician (PhC)/ Clinical Pharmacy Specialist

## 2017-12-16 NOTE — Telephone Encounter (Signed)
LMOM for patient to return call.  Need to know if she ever received Repatha from [employer].  If so will need to labs after 5-6th dose to ensure effectivenss

## 2017-12-28 DIAGNOSIS — Z7901 Long term (current) use of anticoagulants: Secondary | ICD-10-CM | POA: Diagnosis not present

## 2017-12-30 DIAGNOSIS — Z7901 Long term (current) use of anticoagulants: Secondary | ICD-10-CM | POA: Diagnosis not present

## 2018-01-02 DIAGNOSIS — Z7901 Long term (current) use of anticoagulants: Secondary | ICD-10-CM | POA: Diagnosis not present

## 2018-01-04 DIAGNOSIS — Z7901 Long term (current) use of anticoagulants: Secondary | ICD-10-CM | POA: Diagnosis not present

## 2018-01-09 DIAGNOSIS — Z7901 Long term (current) use of anticoagulants: Secondary | ICD-10-CM | POA: Diagnosis not present

## 2018-01-26 DIAGNOSIS — Z7901 Long term (current) use of anticoagulants: Secondary | ICD-10-CM | POA: Diagnosis not present

## 2018-01-31 DIAGNOSIS — K5289 Other specified noninfective gastroenteritis and colitis: Secondary | ICD-10-CM | POA: Diagnosis not present

## 2018-02-03 DIAGNOSIS — Z7901 Long term (current) use of anticoagulants: Secondary | ICD-10-CM | POA: Diagnosis not present

## 2018-02-06 ENCOUNTER — Other Ambulatory Visit: Payer: Self-pay | Admitting: Cardiovascular Disease

## 2018-02-09 ENCOUNTER — Other Ambulatory Visit: Payer: Self-pay

## 2018-02-09 MED ORDER — CARVEDILOL 6.25 MG PO TABS: 6.2500 mg | ORAL_TABLET | Freq: Two times a day (BID) | ORAL | 2 refills | Status: DC

## 2018-02-13 DIAGNOSIS — H401112 Primary open-angle glaucoma, right eye, moderate stage: Secondary | ICD-10-CM | POA: Diagnosis not present

## 2018-02-13 DIAGNOSIS — H401121 Primary open-angle glaucoma, left eye, mild stage: Secondary | ICD-10-CM | POA: Diagnosis not present

## 2018-03-13 DIAGNOSIS — Z7901 Long term (current) use of anticoagulants: Secondary | ICD-10-CM | POA: Diagnosis not present

## 2018-03-20 DIAGNOSIS — Z7901 Long term (current) use of anticoagulants: Secondary | ICD-10-CM | POA: Diagnosis not present

## 2018-03-23 ENCOUNTER — Telehealth: Payer: Self-pay | Admitting: Pharmacist Clinician (PhC)/ Clinical Pharmacy Specialist

## 2018-03-23 NOTE — Telephone Encounter (Signed)
Patient on repatha x 3 doses, still noting some flu-like feelings with each dose.  This worries her because she sings in her church choir and it is interfering with this.   Notes that she is feeling these symptoms today even though it has been 12 days since her last dose.  Suspect that she might have a cold as opposed to being a delayed reaction.   Advised her to skip next dose of Repatha.  In 2 weeks she can take again and if symptoms resume, we can consider a trial of Praluent.  Patient voiced understanding and will call after next dose.

## 2018-03-27 DIAGNOSIS — Z1389 Encounter for screening for other disorder: Secondary | ICD-10-CM | POA: Diagnosis not present

## 2018-03-27 DIAGNOSIS — J069 Acute upper respiratory infection, unspecified: Secondary | ICD-10-CM | POA: Diagnosis not present

## 2018-04-05 ENCOUNTER — Other Ambulatory Visit: Payer: Self-pay | Admitting: *Deleted

## 2018-04-05 MED ORDER — ISOSORBIDE MONONITRATE ER 30 MG PO TB24: 30.0000 mg | ORAL_TABLET | Freq: Every day | ORAL | 1 refills | Status: DC

## 2018-04-05 MED ORDER — POTASSIUM CHLORIDE CRYS ER 20 MEQ PO TBCR: 10.0000 meq | EXTENDED_RELEASE_TABLET | Freq: Every day | ORAL | 1 refills | Status: DC

## 2018-04-05 MED ORDER — CARVEDILOL 6.25 MG PO TABS: 6.2500 mg | ORAL_TABLET | Freq: Two times a day (BID) | ORAL | 1 refills | Status: DC

## 2018-04-10 DIAGNOSIS — L280 Lichen simplex chronicus: Secondary | ICD-10-CM | POA: Diagnosis not present

## 2018-04-18 ENCOUNTER — Telehealth: Payer: Self-pay | Admitting: Pharmacist Clinician (PhC)/ Clinical Pharmacy Specialist

## 2018-04-18 NOTE — Telephone Encounter (Signed)
Patient d/c Repatha after 3rd dose because of flu-like feelings.  Developed bronchitis and had to see PCP for treatment.  Chooses not to re-challenge.    Advised that we have some new medications that should be FDA approved in 2020, we can discuss next time she is in to see cardiologist.

## 2018-04-26 DIAGNOSIS — Z7901 Long term (current) use of anticoagulants: Secondary | ICD-10-CM | POA: Diagnosis not present

## 2018-04-27 DIAGNOSIS — E7849 Other hyperlipidemia: Secondary | ICD-10-CM | POA: Diagnosis not present

## 2018-04-27 DIAGNOSIS — Z23 Encounter for immunization: Secondary | ICD-10-CM | POA: Diagnosis not present

## 2018-04-27 DIAGNOSIS — E559 Vitamin D deficiency, unspecified: Secondary | ICD-10-CM | POA: Diagnosis not present

## 2018-04-27 DIAGNOSIS — E119 Type 2 diabetes mellitus without complications: Secondary | ICD-10-CM | POA: Diagnosis not present

## 2018-04-27 DIAGNOSIS — J449 Chronic obstructive pulmonary disease, unspecified: Secondary | ICD-10-CM | POA: Diagnosis not present

## 2018-04-27 DIAGNOSIS — I10 Essential (primary) hypertension: Secondary | ICD-10-CM | POA: Diagnosis not present

## 2018-05-10 DIAGNOSIS — Z7901 Long term (current) use of anticoagulants: Secondary | ICD-10-CM | POA: Diagnosis not present

## 2018-05-10 DIAGNOSIS — D34 Benign neoplasm of thyroid gland: Secondary | ICD-10-CM | POA: Diagnosis not present

## 2018-05-10 DIAGNOSIS — E042 Nontoxic multinodular goiter: Secondary | ICD-10-CM | POA: Diagnosis not present

## 2018-05-22 DIAGNOSIS — Z7901 Long term (current) use of anticoagulants: Secondary | ICD-10-CM | POA: Diagnosis not present

## 2018-05-29 DIAGNOSIS — E042 Nontoxic multinodular goiter: Secondary | ICD-10-CM | POA: Diagnosis not present

## 2018-05-29 DIAGNOSIS — D249 Benign neoplasm of unspecified breast: Secondary | ICD-10-CM | POA: Diagnosis not present

## 2018-05-29 DIAGNOSIS — I10 Essential (primary) hypertension: Secondary | ICD-10-CM | POA: Diagnosis not present

## 2018-05-29 DIAGNOSIS — M5030 Other cervical disc degeneration, unspecified cervical region: Secondary | ICD-10-CM | POA: Diagnosis not present

## 2018-05-30 DIAGNOSIS — Z7901 Long term (current) use of anticoagulants: Secondary | ICD-10-CM | POA: Diagnosis not present

## 2018-05-31 ENCOUNTER — Other Ambulatory Visit: Payer: Self-pay | Admitting: Cardiovascular Disease

## 2018-06-14 DIAGNOSIS — Z7901 Long term (current) use of anticoagulants: Secondary | ICD-10-CM | POA: Diagnosis not present

## 2018-07-04 DIAGNOSIS — Z7901 Long term (current) use of anticoagulants: Secondary | ICD-10-CM | POA: Diagnosis not present

## 2018-07-17 ENCOUNTER — Encounter: Payer: Self-pay | Admitting: *Deleted

## 2018-07-17 DIAGNOSIS — I10 Essential (primary) hypertension: Secondary | ICD-10-CM | POA: Diagnosis not present

## 2018-07-17 NOTE — Telephone Encounter (Signed)
This encounter was created in error - please disregard.

## 2018-07-27 DIAGNOSIS — Z1389 Encounter for screening for other disorder: Secondary | ICD-10-CM | POA: Diagnosis not present

## 2018-07-27 DIAGNOSIS — Z0001 Encounter for general adult medical examination with abnormal findings: Secondary | ICD-10-CM | POA: Diagnosis not present

## 2018-07-27 DIAGNOSIS — I2510 Atherosclerotic heart disease of native coronary artery without angina pectoris: Secondary | ICD-10-CM | POA: Diagnosis not present

## 2018-07-27 DIAGNOSIS — E119 Type 2 diabetes mellitus without complications: Secondary | ICD-10-CM | POA: Diagnosis not present

## 2018-08-03 DIAGNOSIS — M5416 Radiculopathy, lumbar region: Secondary | ICD-10-CM | POA: Diagnosis not present

## 2018-08-14 DIAGNOSIS — Z9849 Cataract extraction status, unspecified eye: Secondary | ICD-10-CM | POA: Diagnosis not present

## 2018-08-14 DIAGNOSIS — H401112 Primary open-angle glaucoma, right eye, moderate stage: Secondary | ICD-10-CM | POA: Diagnosis not present

## 2018-08-14 DIAGNOSIS — Z961 Presence of intraocular lens: Secondary | ICD-10-CM | POA: Diagnosis not present

## 2018-08-28 ENCOUNTER — Other Ambulatory Visit: Payer: Self-pay | Admitting: Cardiovascular Disease

## 2018-08-31 DIAGNOSIS — M48062 Spinal stenosis, lumbar region with neurogenic claudication: Secondary | ICD-10-CM | POA: Diagnosis not present

## 2018-09-27 DIAGNOSIS — Z7901 Long term (current) use of anticoagulants: Secondary | ICD-10-CM | POA: Diagnosis not present

## 2018-10-25 DIAGNOSIS — M5416 Radiculopathy, lumbar region: Secondary | ICD-10-CM | POA: Diagnosis not present

## 2018-10-25 DIAGNOSIS — M48062 Spinal stenosis, lumbar region with neurogenic claudication: Secondary | ICD-10-CM | POA: Diagnosis not present

## 2018-11-23 DIAGNOSIS — Z7901 Long term (current) use of anticoagulants: Secondary | ICD-10-CM | POA: Diagnosis not present

## 2018-11-28 ENCOUNTER — Other Ambulatory Visit (HOSPITAL_COMMUNITY): Payer: Self-pay | Admitting: Family Medicine

## 2018-11-28 DIAGNOSIS — Z1231 Encounter for screening mammogram for malignant neoplasm of breast: Secondary | ICD-10-CM

## 2018-11-30 ENCOUNTER — Encounter (INDEPENDENT_AMBULATORY_CARE_PROVIDER_SITE_OTHER): Payer: Self-pay | Admitting: *Deleted

## 2018-12-01 ENCOUNTER — Other Ambulatory Visit: Payer: Self-pay | Admitting: Cardiovascular Disease

## 2018-12-22 ENCOUNTER — Other Ambulatory Visit: Payer: Self-pay

## 2018-12-22 ENCOUNTER — Encounter (HOSPITAL_COMMUNITY): Payer: Self-pay

## 2018-12-22 ENCOUNTER — Ambulatory Visit (HOSPITAL_COMMUNITY)
Admission: RE | Admit: 2018-12-22 | Discharge: 2018-12-22 | Disposition: A | Discharge status: Home / Self Care | Payer: Medicare Other | Source: Ambulatory Visit | Attending: Family Medicine | Admitting: Family Medicine

## 2018-12-22 DIAGNOSIS — Z1231 Encounter for screening mammogram for malignant neoplasm of breast: Secondary | ICD-10-CM | POA: Diagnosis not present

## 2018-12-28 DIAGNOSIS — Z7901 Long term (current) use of anticoagulants: Secondary | ICD-10-CM | POA: Diagnosis not present

## 2019-01-08 DIAGNOSIS — Z7901 Long term (current) use of anticoagulants: Secondary | ICD-10-CM | POA: Diagnosis not present

## 2019-01-17 ENCOUNTER — Other Ambulatory Visit: Payer: Self-pay | Admitting: Neurosurgery

## 2019-01-17 DIAGNOSIS — M48062 Spinal stenosis, lumbar region with neurogenic claudication: Secondary | ICD-10-CM | POA: Diagnosis not present

## 2019-01-17 DIAGNOSIS — M5416 Radiculopathy, lumbar region: Secondary | ICD-10-CM | POA: Diagnosis not present

## 2019-01-22 ENCOUNTER — Other Ambulatory Visit: Payer: Self-pay | Admitting: Cardiovascular Disease

## 2019-01-28 ENCOUNTER — Other Ambulatory Visit: Payer: Self-pay

## 2019-01-28 ENCOUNTER — Ambulatory Visit
Admission: RE | Admit: 2019-01-28 | Discharge: 2019-01-28 | Disposition: A | Discharge status: Home / Self Care | Payer: Medicare Other | Source: Ambulatory Visit | Attending: Neurosurgery | Admitting: Neurosurgery

## 2019-01-28 DIAGNOSIS — M48061 Spinal stenosis, lumbar region without neurogenic claudication: Secondary | ICD-10-CM | POA: Diagnosis not present

## 2019-01-28 DIAGNOSIS — M5416 Radiculopathy, lumbar region: Secondary | ICD-10-CM

## 2019-02-06 ENCOUNTER — Other Ambulatory Visit: Payer: Medicare Other

## 2019-02-06 DIAGNOSIS — M545 Low back pain: Secondary | ICD-10-CM | POA: Diagnosis not present

## 2019-02-06 DIAGNOSIS — M48062 Spinal stenosis, lumbar region with neurogenic claudication: Secondary | ICD-10-CM | POA: Diagnosis not present

## 2019-02-08 ENCOUNTER — Other Ambulatory Visit: Payer: Self-pay | Admitting: Cardiovascular Disease

## 2019-02-13 DIAGNOSIS — L03116 Cellulitis of left lower limb: Secondary | ICD-10-CM | POA: Diagnosis not present

## 2019-02-13 DIAGNOSIS — E1165 Type 2 diabetes mellitus with hyperglycemia: Secondary | ICD-10-CM | POA: Diagnosis not present

## 2019-02-13 DIAGNOSIS — I10 Essential (primary) hypertension: Secondary | ICD-10-CM | POA: Diagnosis not present

## 2019-02-13 DIAGNOSIS — S81812A Laceration without foreign body, left lower leg, initial encounter: Secondary | ICD-10-CM | POA: Diagnosis not present

## 2019-02-13 DIAGNOSIS — E119 Type 2 diabetes mellitus without complications: Secondary | ICD-10-CM | POA: Diagnosis not present

## 2019-02-15 ENCOUNTER — Other Ambulatory Visit (HOSPITAL_COMMUNITY): Payer: Self-pay | Admitting: Internal Medicine

## 2019-02-15 DIAGNOSIS — E2839 Other primary ovarian failure: Secondary | ICD-10-CM

## 2019-02-21 DIAGNOSIS — S81812D Laceration without foreign body, left lower leg, subsequent encounter: Secondary | ICD-10-CM | POA: Diagnosis not present

## 2019-02-26 ENCOUNTER — Encounter (INDEPENDENT_AMBULATORY_CARE_PROVIDER_SITE_OTHER): Payer: Self-pay | Admitting: *Deleted

## 2019-03-05 ENCOUNTER — Inpatient Hospital Stay (HOSPITAL_COMMUNITY): Admission: RE | Admit: 2019-03-05 | Payer: Medicare Other | Source: Ambulatory Visit

## 2019-03-07 DIAGNOSIS — Z7901 Long term (current) use of anticoagulants: Secondary | ICD-10-CM | POA: Diagnosis not present

## 2019-03-19 DIAGNOSIS — H401112 Primary open-angle glaucoma, right eye, moderate stage: Secondary | ICD-10-CM | POA: Diagnosis not present

## 2019-03-23 DIAGNOSIS — Z7901 Long term (current) use of anticoagulants: Secondary | ICD-10-CM | POA: Diagnosis not present

## 2019-04-04 DIAGNOSIS — I2510 Atherosclerotic heart disease of native coronary artery without angina pectoris: Secondary | ICD-10-CM | POA: Diagnosis not present

## 2019-04-04 DIAGNOSIS — E7849 Other hyperlipidemia: Secondary | ICD-10-CM | POA: Diagnosis not present

## 2019-04-04 DIAGNOSIS — I700 Atherosclerosis of aorta: Secondary | ICD-10-CM | POA: Diagnosis not present

## 2019-04-04 DIAGNOSIS — I10 Essential (primary) hypertension: Secondary | ICD-10-CM | POA: Diagnosis not present

## 2019-04-12 ENCOUNTER — Other Ambulatory Visit: Payer: Self-pay | Admitting: Cardiovascular Disease

## 2019-04-13 NOTE — Telephone Encounter (Signed)
No ans no vm   °

## 2019-04-13 NOTE — Telephone Encounter (Signed)
Pt overdue for 6 month f/u.  Please contact pt for future appointment. 

## 2019-04-18 NOTE — Telephone Encounter (Signed)
Patient not home will call later .

## 2019-04-25 DIAGNOSIS — E119 Type 2 diabetes mellitus without complications: Secondary | ICD-10-CM | POA: Diagnosis not present

## 2019-04-26 DIAGNOSIS — Z7901 Long term (current) use of anticoagulants: Secondary | ICD-10-CM | POA: Diagnosis not present

## 2019-04-30 ENCOUNTER — Other Ambulatory Visit: Payer: Self-pay

## 2019-04-30 ENCOUNTER — Other Ambulatory Visit: Payer: Self-pay | Admitting: Cardiovascular Disease

## 2019-04-30 DIAGNOSIS — Z7901 Long term (current) use of anticoagulants: Secondary | ICD-10-CM | POA: Diagnosis not present

## 2019-04-30 MED ORDER — CARVEDILOL 6.25 MG PO TABS: 6.2500 mg | ORAL_TABLET | Freq: Two times a day (BID) | ORAL | 4 refills | Status: DC

## 2019-05-05 DIAGNOSIS — I2510 Atherosclerotic heart disease of native coronary artery without angina pectoris: Secondary | ICD-10-CM | POA: Diagnosis not present

## 2019-05-05 DIAGNOSIS — I10 Essential (primary) hypertension: Secondary | ICD-10-CM | POA: Diagnosis not present

## 2019-05-05 DIAGNOSIS — E1165 Type 2 diabetes mellitus with hyperglycemia: Secondary | ICD-10-CM | POA: Diagnosis not present

## 2019-05-05 DIAGNOSIS — E7849 Other hyperlipidemia: Secondary | ICD-10-CM | POA: Diagnosis not present

## 2019-05-07 DIAGNOSIS — Z7901 Long term (current) use of anticoagulants: Secondary | ICD-10-CM | POA: Diagnosis not present

## 2019-05-08 DIAGNOSIS — M48062 Spinal stenosis, lumbar region with neurogenic claudication: Secondary | ICD-10-CM | POA: Diagnosis not present

## 2019-05-16 DIAGNOSIS — Z7901 Long term (current) use of anticoagulants: Secondary | ICD-10-CM | POA: Diagnosis not present

## 2019-05-16 DIAGNOSIS — D34 Benign neoplasm of thyroid gland: Secondary | ICD-10-CM | POA: Diagnosis not present

## 2019-05-16 DIAGNOSIS — E042 Nontoxic multinodular goiter: Secondary | ICD-10-CM | POA: Diagnosis not present

## 2019-05-17 DIAGNOSIS — Z7901 Long term (current) use of anticoagulants: Secondary | ICD-10-CM | POA: Diagnosis not present

## 2019-05-18 DIAGNOSIS — Z7901 Long term (current) use of anticoagulants: Secondary | ICD-10-CM | POA: Diagnosis not present

## 2019-05-21 DIAGNOSIS — Z7901 Long term (current) use of anticoagulants: Secondary | ICD-10-CM | POA: Diagnosis not present

## 2019-05-23 DIAGNOSIS — E042 Nontoxic multinodular goiter: Secondary | ICD-10-CM | POA: Diagnosis not present

## 2019-05-23 DIAGNOSIS — M5136 Other intervertebral disc degeneration, lumbar region: Secondary | ICD-10-CM | POA: Diagnosis not present

## 2019-05-23 DIAGNOSIS — I10 Essential (primary) hypertension: Secondary | ICD-10-CM | POA: Diagnosis not present

## 2019-05-23 DIAGNOSIS — D249 Benign neoplasm of unspecified breast: Secondary | ICD-10-CM | POA: Diagnosis not present

## 2019-05-24 DIAGNOSIS — Z7901 Long term (current) use of anticoagulants: Secondary | ICD-10-CM | POA: Diagnosis not present

## 2019-06-01 DIAGNOSIS — H3581 Retinal edema: Secondary | ICD-10-CM | POA: Diagnosis not present

## 2019-06-04 DIAGNOSIS — E7849 Other hyperlipidemia: Secondary | ICD-10-CM | POA: Diagnosis not present

## 2019-06-04 DIAGNOSIS — E1165 Type 2 diabetes mellitus with hyperglycemia: Secondary | ICD-10-CM | POA: Diagnosis not present

## 2019-06-04 DIAGNOSIS — Z7901 Long term (current) use of anticoagulants: Secondary | ICD-10-CM | POA: Diagnosis not present

## 2019-06-07 NOTE — Telephone Encounter (Signed)
[employer] patient  Scheduled with [employer]

## 2019-06-11 DIAGNOSIS — Z7901 Long term (current) use of anticoagulants: Secondary | ICD-10-CM | POA: Diagnosis not present

## 2019-06-14 NOTE — Progress Notes (Addendum)
[facility] Note  06/15/2019     CHIEF COMPLAINT Patient presents for Retina Evaluation   HISTORY OF PRESENT ILLNESS: [name] is a 78 y.o. female who presents to the clinic today for:   HPI    Retina Evaluation    In right eye.  This started 2 weeks ago.  Duration of 2 weeks.  Associated Symptoms Floaters and Distortion.  Context:  distance vision, mid-range vision, near vision, driving and night driving.  Treatments tried include no treatments.  I, the attending physician,  performed the HPI with the patient and updated documentation appropriately.          Comments    78 y/o female pt referred by Dr. Jorja Loa for eval of SRF OD.  Saw Dr. Jorja Loa on 06/01/19.  Pt noticed sudden onset decrease in vision OD 2 days prior to Thanksgiving, noticing that with her right eye she had trouble seeing her Christmas Cactus plant, and that her OD vision was very blurred and grayed out.  VA OD has continued to gradually deteriorate ever since.  VA OS also blurred for some time, and pt reports the acuity chart looked "wavy" when viewing with her left eye this a.m.; first time pt has noticed this.  Pt only wears otc readers.  Has had occasional pain OD since noticing vision changes.  Denies flashes, but has occasional small floaters OU.  Latanoprost QHS OU.  Has had laser sx multiple times OU in the past for [place].  Last BS in the 130s.  A1C 8.4.       Last edited by Bernarda Caffey, MD on 06/16/2019 11:10 PM. (History)    pt states she saw Dr. Jorja Loa the day after Thanksgiving, she states she was looking at her Christmas cactus the day before Thanksgiving and noticed that she could not see it when she had her left eye covered, but with her right eye covered she says the cactus was "beautiful", pt is on warfarin bc she had blood clots in her legs, pt has glaucoma and takes latanoprost QHS  Referring physician: Madelin Headings, DO [address] [place],  [place]  [id number]  HISTORICAL INFORMATION:   Selected notes from the MEDICAL RECORD NUMBER Referred by Dr. Madelin Headings for concern of SRF OD LEE: 11.27.20 (M. Cotter) [BCVA: OD: 20/80-- OS: 20/60-]  Ocular Hx-glaucoma (latanoprost)  PMH-DM    CURRENT MEDICATIONS: Current Outpatient Medications (Ophthalmic Drugs)  Medication Sig  . latanoprost (XALATAN) 0.005 % ophthalmic solution Place 1 drop into both eyes at bedtime.   [name] tetrahydrozoline 0.05 % ophthalmic solution Place 1 drop into both eyes at bedtime.   No current facility-administered medications for this visit. (Ophthalmic Drugs)   Current Outpatient Medications (Other)  Medication Sig  . albuterol (PROVENTIL HFA;VENTOLIN HFA) 108 (90 BASE) MCG/ACT inhaler Inhale 2 puffs into the lungs every 4 (four) hours as needed for shortness of breath.  . allopurinol (ZYLOPRIM) 100 MG tablet Take 100 mg by mouth daily as needed.   [name] amitriptyline (ELAVIL) 25 MG tablet Take 50 mg by mouth at bedtime.   . Black Cohosh 40 MG CAPS Take 40 mg by mouth 2 (two) times daily.  . carvedilol (COREG) 6.25 MG tablet Take 1 tablet (6.25 mg total) by mouth 2 (two) times daily.  . cephALEXin (KEFLEX) 500 MG capsule Take 500 mg by mouth 4 (four) times daily.  . cetirizine (ZYRTEC) 10 MG tablet Take 10 mg by mouth daily.  [name]  Cholecalciferol (VITAMIN D-3) 1000 units CAPS Take 1 capsule by mouth daily.  [name] esomeprazole (NEXIUM) 20 MG capsule Take 20 mg by mouth daily at 12 noon.  . Evolocumab (REPATHA SURECLICK) [id number] MG/ML SOAJ Inject 140 mg into the skin every 14 (fourteen) days.  . furosemide (LASIX) 40 MG tablet TAKE 1 TABLET (40 MG TOTAL) BY MOUTH DAILY. MAY TAKE EXTRA DAILY AS NEEDED FOR SWELLING  . gabapentin (NEURONTIN) 300 MG capsule Take 300 mg by mouth 3 (three) times daily.  [name] glimepiride (AMARYL) 2 MG tablet Take 2 mg by mouth daily.  . isosorbide mononitrate (IMDUR) 30 MG 24 hr tablet TAKE 1 TABLET BY MOUTH  DAILY  . losartan (COZAAR) 50 MG tablet Take 25 mg by  mouth daily.  . meclizine (ANTIVERT) 25 MG tablet Take 25 mg by mouth as needed for dizziness.   . Omega-3 Fatty Acids (FISH OIL) 1200 MG CAPS Take 1 capsule by mouth 2 (two) times daily.  . polyethylene glycol (MIRALAX / GLYCOLAX) packet Take 17 g by mouth daily.  . potassium chloride SA (KLOR-CON) 20 MEQ tablet TAKE ONE-HALF TABLET BY  MOUTH DAILY  . pravastatin (PRAVACHOL) 20 MG tablet Take by mouth.  . sitaGLIPtin (JANUVIA) 50 MG tablet Take 1 tablet by mouth daily.  . vitamin B-12 (CYANOCOBALAMIN) 100 MCG tablet Take 1 tablet by mouth daily.  [name] warfarin (COUMADIN) 1 MG tablet Take 1 mg by mouth daily.   No current facility-administered medications for this visit. (Other)      REVIEW OF SYSTEMS: ROS    Positive for: Gastrointestinal, Genitourinary, Endocrine, Cardiovascular, Eyes, Respiratory   Negative for: Constitutional, Neurological, Skin, Musculoskeletal, HENT, Psychiatric, Allergic/Imm, Heme/Lymph   Last edited by Matthew Folks, COA on 06/15/2019 10:00 AM. (History)       ALLERGIES Allergies  Allergen Reactions  . Vioxx [Rofecoxib] Shortness Of Breath  . Metformin And Related     Kidney failure  . Penicillins     rash  . Pravastatin   . Codeine Rash  . Motrin [Ibuprofen] Rash  . Sulfur Rash    PAST MEDICAL HISTORY Past Medical History:  Diagnosis Date  . Antral gastritis    EGD 11/15  . Asthmatic bronchitis   . Back pain   . Chronic diastolic heart failure (HCC) 05/20/2015   Grade 2 diastolic dysfunction.  04/2015.  [name] Chronic kidney disease    kidney function low  . Diabetes mellitus    x 5 yrs  . DVT of axillary vein, acute left ([place]) 07/24/12  . GERD (gastroesophageal reflux disease)   . Glaucoma   . Hyperlipidemia 05/20/2015  . Hypertension   . Hypothyroidism   . Kidney stones   . Mixed hyperlipidemia   . Peripheral venous insufficiency   . Pinched nerve    right elbow  . Sigmoid diverticulitis   . Vertigo    chonic   Past Surgical  History:  Procedure Laterality Date  . ABDOMINAL HYSTERECTOMY    . BACK SURGERY     spinal   . CARDIAC CATHETERIZATION N/A 05/26/2015   Procedure: Left Heart Cath and Coronary Angiography;  Surgeon: Jettie Booze, MD;  Location: [facility];  Service: Cardiovascular;  Laterality: N/A;  . CATARACT EXTRACTION Bilateral   . CHOLECYSTECTOMY    . COLON SURGERY    . COLONOSCOPY N/A 12/27/2013   Procedure: COLONOSCOPY;  Surgeon: Rogene Houston, MD;  Location: [facility];  Service: Endoscopy;  Laterality: N/A;  200  .  COLOSTOMY CLOSURE    . ESOPHAGOGASTRODUODENOSCOPY N/A 05/07/2014   Procedure: ESOPHAGOGASTRODUODENOSCOPY (EGD);  Surgeon: Rogene Houston, MD;  Location: [facility];  Service: Endoscopy;  Laterality: N/A;  . EYE SURGERY Bilateral    Cat Sx  . fracture left foot    . HERNIA REPAIR    . NM MYOCAR PERF WALL MOTION  01/28/2009   Normal  . OTHER SURGICAL HISTORY     colostomy, colostomy reversal, for diverticulitis surgical hernia repair, arm surgery, neck surgery  . US ECHOCARDIOGRAPHY  02/11/2010   Mild MR,trace TR & AI    FAMILY HISTORY Family History  Problem Relation Age of Onset  . Other Mother 41       Cause unknown  . Cancer Mother        liver  . CVA Maternal Grandmother 90       deceased  . Heart disease Maternal Grandmother   . Stroke Maternal Grandmother   . Heart attack Brother 43       deceased  . Cancer Sister 13       deceased  . Glaucoma Maternal Uncle     SOCIAL HISTORY Social History   Tobacco Use  . Smoking status: Former Smoker    Types: Cigarettes    Quit date: 12/10/2013    Years since quitting: 5.5  . Smokeless tobacco: Never Used  . Tobacco comment: Smoke 1-1 1/2 packs a day  Substance Use Topics  . Alcohol use: No  . Drug use: No         OPHTHALMIC EXAM:  Base Eye Exam    Visual Acuity (Snellen - Linear)      Right Left   Dist [place] 20/60 - 20/70 -2   Dist ph  20/50 +2 20/30 -2       Tonometry (Tonopen,  10:00 AM)      Right Left   Pressure 19 20       Pupils      Dark Light Shape React APD   Right 3 2 Round Brisk None   Left 3 2 Round Brisk None       Visual Fields (Counting fingers)      Left Right    Full Full       Extraocular Movement      Right Left    Full, Ortho Full, Ortho       Neuro/Psych    Oriented x3: Yes   Mood/Affect: Normal       Dilation    Both eyes: 1.0% Mydriacyl, 2.5% Phenylephrine @ 10:00 AM        Slit Lamp and Fundus Exam    Slit Lamp Exam      Right Left   Lids/Lashes Dermatochalasis - upper lid, Meibomian gland dysfunction Dermatochalasis - upper lid, Telangiectasia, mild Meibomian gland dysfunction   Conjunctiva/Sclera White and quiet White and quiet   Cornea 2+ Punctate epithelial erosions 1+ inferior Punctate epithelial erosions   Anterior Chamber Deep and quiet, narrow temporal angle Deep and quiet, narrow temporal angle   Iris Round and moderately dilated to 5.70mm Round and moderately dilated to 5.61mm   Lens Posterior chamber intraocular lens Posterior chamber intraocular lens   Vitreous Vitreous syneresis Vitreous syneresis       Fundus Exam      Right Left   Disc Sharp rim, Pallor, +cupping, mild, temporal Peripapillary atrophy Pink and Sharp, temporal Peripapillary atrophy   C/D Ratio 0.75 0.6   Macula Blunted foveal reflex, +  CNV, +SRF, Drusen Flat, Blunted foveal reflex, drusen, Retinal pigment epithelial mottling, No heme or edema   Vessels Vascular attenuation, Tortuous Vascular attenuation, Tortuous   Periphery Attached; no heme Attached; no heme        Refraction    Manifest Refraction      Sphere Cylinder Axis Dist VA   Right [place] +0.25 005 20/50-2   Left -0.50 Sphere  20/30-2          IMAGING AND PROCEDURES  Imaging and Procedures for @TODAY @  OCT, Retina - OU - Both Eyes       Right Eye Quality was good. Central Foveal Thickness: 337. Progression has no prior data. Findings include abnormal foveal  contour, subretinal fluid, intraretinal fluid, intraretinal hyper-reflective material, pigment epithelial detachment, subretinal hyper-reflective material, retinal drusen .   Left Eye Quality was good. Central Foveal Thickness: 216. Progression has no prior data. Findings include normal foveal contour, no IRF, no SRF, retinal drusen .   Notes *Images captured and stored on drive  Diagnosis / Impression:  OD: abnormal foveal contour, +SRHM/PED w/ overlying SRF/IRF -- exudative ARMD w/ CNV  OS: NFP, no IRF/SRF; +drusen -- nonexudative ARMD  Clinical management:  See below  Abbreviations: NFP - Normal foveal profile. CME - cystoid macular edema. PED - pigment epithelial detachment. IRF - intraretinal fluid. SRF - subretinal fluid. EZ - ellipsoid zone. ERM - epiretinal membrane. ORA - outer retinal atrophy. ORT - outer retinal tubulation. SRHM - subretinal hyper-reflective material        Fluorescein Angiography Optos (Transit OS)       Right Eye   Progression has no prior data. Early phase findings include window defect, staining, choroidal neovascularization. Mid/Late phase findings include staining, window defect, choroidal neovascularization, leakage (Focal CNV nasal to fovea).   Left Eye   Progression has no prior data. Early phase findings include staining. Mid/Late phase findings include staining.   Notes *Images captured and stored on drive  Diagnosis / Impression:  OD: exu ARMD w/ +CNVM nasal macula OS: non-exu ARMD  Clinical management:  See below  Abbreviations: NFP - Normal foveal profile. CME - cystoid macular edema. PED - pigment epithelial detachment. IRF - intraretinal fluid. SRF - subretinal fluid. EZ - ellipsoid zone. ERM - epiretinal membrane. ORA - outer retinal atrophy. ORT - outer retinal tubulation. SRHM - subretinal hyper-reflective material        Intravitreal Injection, Pharmacologic Agent - OD - Right Eye       Time Out 06/15/2019. 12:26  PM. Confirmed correct patient, procedure, site, and patient consented.   Anesthesia Topical anesthesia was used. Anesthetic medications included Lidocaine 2%, Proparacaine 0.5%.   Procedure Preparation included 5% betadine to ocular surface, eyelid speculum. A supplied needle was used.   Injection:  1.25 mg Bevacizumab (AVASTIN) SOLN   NDC: [id number], Lot: [id number] , Expiration date: 07/11/2019   Route: Intravitreal, Site: Right Eye, Waste: 0 mL  Post-op Post injection exam found visual acuity of at least counting fingers. The patient tolerated the procedure well. There were no complications. The patient received written and verbal post procedure care education.                 ASSESSMENT/PLAN:    ICD-10-CM   1. Exudative age-related macular degeneration of right eye with active choroidal neovascularization (HCC)  H35.3211 Intravitreal Injection, Pharmacologic Agent - OD - Right Eye    Bevacizumab (AVASTIN) SOLN 1.25 mg  2. Retinal edema  H35.81 OCT, Retina - OU -  Both Eyes  3. Intermediate stage nonexudative age-related macular degeneration of left eye  H35.3122   4. Diabetes mellitus type 2 without retinopathy ([place])  E11.9   5. Hypertensive retinopathy of both eyes  H35.033 Fluorescein Angiography Optos (Transit OS)  6. Essential hypertension  I10   7. Pseudophakia of both eyes  Z96.1   8. Primary open angle glaucoma of both eyes, unspecified glaucoma stage  H40.1130     1,2. 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, OD    - The incidence pathology and anatomy of wet AMD discussed   - The ANCHOR, MARINA, CATT and VIEW trials discussed with patient.    - discussed treatment options including observation vs intravitreal anti-VEGF agents such as Avastin, Lucentis, Eylea.    - Risks of endophthalmitis and vascular occlusive events and atrophic changes discussed with patient  - OCT shows PED/CNVM w/ overlying SRF and IRF OD  - FA 12.11.20 confirms CNVM  - BCVA OD  20/50+2  - discussed findings, prognosis and treatment options  - recommend IVA OD #1 today 12.12.20  - pt wishes to be treated with IVA  - RBA of procedure discussed, questions answered  - informed consent obtained and signed  - see procedure note  - f/u in 4 wks -- DFE/OCT/possible injection  3. Age related macular degeneration, non-exudative, OS  - intermediate stage  - The incidence, anatomy, and pathology of dry AMD, risk of progression, and the AREDS and AREDS 2 study including smoking risks discussed with patient.  - Recommend amsler grid monitoring  4. Diabetes mellitus, type 2 without retinopathy  - The incidence, risk factors for progression, natural history and treatment options for diabetic retinopathy  were discussed with patient.    - The need for close monitoring of blood glucose, blood pressure, and serum lipids, avoiding cigarette or any type of tobacco, and the need for long term follow up was also discussed with patient.  - monitor  5,6. Hypertensive retinopathy OU - discussed importance of tight BP control - monitor  7. Pseudophakia OU  - s/p CE/IOL (Dr. Venetia Maxon)  - beautiful surgery, doing well  - monitor  8. POAG OU  - formerly managed by Dr. Venetia Maxon  - s/p laser w/ Dr. Venetia Maxon -- ?SLT  - IOP 19, 20  - currently on latanoprost QHS  - monitor   Ophthalmic Meds Ordered this visit:  Meds ordered this encounter  Medications  . Bevacizumab (AVASTIN) SOLN 1.25 mg       Return in about 4 weeks (around 07/13/2019) for f/u exu ARMD OD, DFE, OCT.  There are no Patient Instructions on file for this visit.   Explained the diagnoses, plan, and follow up with the patient and they expressed understanding.  Patient expressed understanding of the importance of proper follow up care.   This document serves as a record of services personally performed by Gardiner Sleeper, MD, PhD. It was created on their behalf by Ernest Mallick, OA, an ophthalmic assistant. The  creation of this record is the provider's dictation and/or activities during the visit.    Electronically signed by: Ernest Mallick, OA 12.10.2020 11:32 PM   Gardiner Sleeper, M.D., Ph.D. Diseases & Surgery of the Retina and Vitreous [facility]  I have reviewed the above documentation for accuracy and completeness, and I agree with the above. Gardiner Sleeper, M.D., Ph.D. 06/16/19 11:32 PM    Abbreviations: M myopia (nearsighted); A astigmatism; H hyperopia (farsighted); P presbyopia; Mrx  spectacle prescription;  CTL contact lenses; OD right eye; OS left eye; OU both eyes  XT exotropia; ET esotropia; PEK punctate epithelial keratitis; PEE punctate epithelial erosions; DES dry eye syndrome; MGD meibomian gland dysfunction; ATs artificial tears; PFAT's preservative free artificial tears; [place] nuclear sclerotic cataract; PSC posterior subcapsular cataract; ERM epi-retinal membrane; PVD posterior vitreous detachment; RD retinal detachment; DM diabetes mellitus; DR diabetic retinopathy; NPDR non-proliferative diabetic retinopathy; PDR proliferative diabetic retinopathy; CSME clinically significant macular edema; DME diabetic macular edema; dbh dot blot hemorrhages; CWS cotton wool spot; POAG primary open angle glaucoma; C/D cup-to-disc ratio; HVF humphrey visual field; GVF goldmann visual field; OCT optical coherence tomography; IOP intraocular pressure; BRVO Branch retinal vein occlusion; CRVO central retinal vein occlusion; CRAO central retinal artery occlusion; BRAO branch retinal artery occlusion; RT retinal tear; SB scleral buckle; PPV pars plana vitrectomy; VH Vitreous hemorrhage; PRP panretinal laser photocoagulation; IVK intravitreal kenalog; VMT vitreomacular traction; MH Macular hole;  NVD neovascularization of the disc; NVE neovascularization elsewhere; AREDS age related eye disease study; ARMD age related macular degeneration; POAG primary open angle glaucoma; EBMD  epithelial/anterior basement membrane dystrophy; ACIOL anterior chamber intraocular lens; IOL intraocular lens; PCIOL posterior chamber intraocular lens; Phaco/IOL phacoemulsification with intraocular lens placement; [place] photorefractive keratectomy; LASIK laser assisted in situ keratomileusis; HTN hypertension; DM diabetes mellitus; COPD chronic obstructive pulmonary disease

## 2019-06-15 ENCOUNTER — Other Ambulatory Visit: Payer: Self-pay

## 2019-06-15 ENCOUNTER — Ambulatory Visit (INDEPENDENT_AMBULATORY_CARE_PROVIDER_SITE_OTHER): Payer: Medicare Other | Admitting: Ophthalmology

## 2019-06-15 ENCOUNTER — Encounter (INDEPENDENT_AMBULATORY_CARE_PROVIDER_SITE_OTHER): Payer: Self-pay | Admitting: Ophthalmology

## 2019-06-15 DIAGNOSIS — Z961 Presence of intraocular lens: Secondary | ICD-10-CM

## 2019-06-15 DIAGNOSIS — H3581 Retinal edema: Secondary | ICD-10-CM

## 2019-06-15 DIAGNOSIS — H35033 Hypertensive retinopathy, bilateral: Secondary | ICD-10-CM

## 2019-06-15 DIAGNOSIS — H353211 Exudative age-related macular degeneration, right eye, with active choroidal neovascularization: Secondary | ICD-10-CM | POA: Diagnosis not present

## 2019-06-15 DIAGNOSIS — E119 Type 2 diabetes mellitus without complications: Secondary | ICD-10-CM

## 2019-06-15 DIAGNOSIS — I10 Essential (primary) hypertension: Secondary | ICD-10-CM

## 2019-06-15 DIAGNOSIS — H401130 Primary open-angle glaucoma, bilateral, stage unspecified: Secondary | ICD-10-CM

## 2019-06-15 DIAGNOSIS — H353122 Nonexudative age-related macular degeneration, left eye, intermediate dry stage: Secondary | ICD-10-CM | POA: Diagnosis not present

## 2019-06-16 ENCOUNTER — Encounter (INDEPENDENT_AMBULATORY_CARE_PROVIDER_SITE_OTHER): Payer: Self-pay | Admitting: Ophthalmology

## 2019-06-16 MED ORDER — BEVACIZUMAB CHEMO INJECTION 1.25MG/0.05ML SYRINGE FOR KALEIDOSCOPE
1.2500 mg | INTRAVITREAL | Status: AC | PRN
  Administered 2019-06-16: 23:00:00 1.25 mg via INTRAVITREAL

## 2019-06-18 DIAGNOSIS — Z7901 Long term (current) use of anticoagulants: Secondary | ICD-10-CM | POA: Diagnosis not present

## 2019-06-19 DIAGNOSIS — E119 Type 2 diabetes mellitus without complications: Secondary | ICD-10-CM | POA: Diagnosis not present

## 2019-06-25 DIAGNOSIS — Z7901 Long term (current) use of anticoagulants: Secondary | ICD-10-CM | POA: Diagnosis not present

## 2019-06-25 DIAGNOSIS — E7849 Other hyperlipidemia: Secondary | ICD-10-CM | POA: Diagnosis not present

## 2019-06-25 DIAGNOSIS — E1165 Type 2 diabetes mellitus with hyperglycemia: Secondary | ICD-10-CM | POA: Diagnosis not present

## 2019-06-25 DIAGNOSIS — I10 Essential (primary) hypertension: Secondary | ICD-10-CM | POA: Diagnosis not present

## 2019-07-05 DIAGNOSIS — E1165 Type 2 diabetes mellitus with hyperglycemia: Secondary | ICD-10-CM | POA: Diagnosis not present

## 2019-07-05 DIAGNOSIS — I2510 Atherosclerotic heart disease of native coronary artery without angina pectoris: Secondary | ICD-10-CM | POA: Diagnosis not present

## 2019-07-05 DIAGNOSIS — I10 Essential (primary) hypertension: Secondary | ICD-10-CM | POA: Diagnosis not present

## 2019-07-05 DIAGNOSIS — H353211 Exudative age-related macular degeneration, right eye, with active choroidal neovascularization: Secondary | ICD-10-CM | POA: Diagnosis not present

## 2019-07-10 ENCOUNTER — Ambulatory Visit: Payer: Medicare Other | Attending: Internal Medicine

## 2019-07-10 DIAGNOSIS — Z20822 Contact with and (suspected) exposure to covid-19: Secondary | ICD-10-CM

## 2019-07-12 ENCOUNTER — Telehealth: Payer: Self-pay

## 2019-07-12 LAB — NOVEL CORONAVIRUS, NAA: SARS-CoV-2, NAA: NOT DETECTED

## 2019-07-12 NOTE — Telephone Encounter (Signed)
Pt. Given COVID 19 results, verbalizes understanding. 

## 2019-07-13 ENCOUNTER — Encounter (INDEPENDENT_AMBULATORY_CARE_PROVIDER_SITE_OTHER): Payer: Medicare Other | Admitting: Ophthalmology

## 2019-07-16 ENCOUNTER — Telehealth: Payer: Self-pay | Admitting: Family Medicine

## 2019-07-16 NOTE — Telephone Encounter (Signed)
Negative COVID results given. Patient results "NOT Detected." Caller expressed understanding. ° °

## 2019-07-19 ENCOUNTER — Ambulatory Visit: Payer: Medicare Other | Admitting: Cardiovascular Disease

## 2019-07-20 ENCOUNTER — Other Ambulatory Visit: Payer: Self-pay | Admitting: Cardiovascular Disease

## 2019-07-25 ENCOUNTER — Other Ambulatory Visit (HOSPITAL_COMMUNITY): Payer: Self-pay | Admitting: Internal Medicine

## 2019-07-25 DIAGNOSIS — E2839 Other primary ovarian failure: Secondary | ICD-10-CM

## 2019-07-27 ENCOUNTER — Encounter (INDEPENDENT_AMBULATORY_CARE_PROVIDER_SITE_OTHER): Payer: Medicare Other | Admitting: Ophthalmology

## 2019-08-05 ENCOUNTER — Other Ambulatory Visit: Payer: Self-pay | Admitting: Cardiovascular Disease

## 2019-08-05 DIAGNOSIS — H353211 Exudative age-related macular degeneration, right eye, with active choroidal neovascularization: Secondary | ICD-10-CM | POA: Diagnosis not present

## 2019-08-05 DIAGNOSIS — I2510 Atherosclerotic heart disease of native coronary artery without angina pectoris: Secondary | ICD-10-CM | POA: Diagnosis not present

## 2019-08-05 DIAGNOSIS — I10 Essential (primary) hypertension: Secondary | ICD-10-CM | POA: Diagnosis not present

## 2019-08-05 DIAGNOSIS — E1165 Type 2 diabetes mellitus with hyperglycemia: Secondary | ICD-10-CM | POA: Diagnosis not present

## 2019-08-09 DIAGNOSIS — E119 Type 2 diabetes mellitus without complications: Secondary | ICD-10-CM | POA: Diagnosis not present

## 2019-08-10 ENCOUNTER — Encounter (INDEPENDENT_AMBULATORY_CARE_PROVIDER_SITE_OTHER): Payer: Medicare Other | Admitting: Ophthalmology

## 2019-08-10 NOTE — Progress Notes (Signed)
[facility] Note  08/17/2019     CHIEF COMPLAINT Patient presents for Retina Follow Up   HISTORY OF PRESENT ILLNESS: [name] is a 79 y.o. female who presents to the clinic today for:   HPI    Retina Follow Up    Patient presents with  Wet AMD.  In right eye.  This started 4 weeks ago.  Severity is moderate.  I, the attending physician,  performed the HPI with the patient and updated documentation appropriately.          Comments    Patient here for 4 weeks retina follow up for Exu ARMD OD. Patient states vision about the same.no eye pain. Has an appt next week with reg eye dr.        Maryjane Hurter edited by Bernarda Caffey, MD on 08/18/2019 12:02 AM. (History)    Patient states vision about the same OU. Patient is late to f/u--8 weeks instead of 4 weeks. Patient's husband passed away last week.  Referring physician: Sharilyn Sites, MD [address] [place],  [place] [address]  HISTORICAL INFORMATION:   Selected notes from the MEDICAL RECORD NUMBER Referred by Dr. Madelin Headings for concern of SRF OD LEE: 11.27.20 (M. Cotter) [BCVA: OD: 20/80-- OS: 20/60-]  Ocular Hx-glaucoma (latanoprost)  PMH-DM    CURRENT MEDICATIONS: Current Outpatient Medications (Ophthalmic Drugs)  Medication Sig  . latanoprost (XALATAN) 0.005 % ophthalmic solution Place 1 drop into both eyes at bedtime.   [name] tetrahydrozoline 0.05 % ophthalmic solution Place 1 drop into both eyes at bedtime.   No current facility-administered medications for this visit. (Ophthalmic Drugs)   Current Outpatient Medications (Other)  Medication Sig  . albuterol (PROVENTIL HFA;VENTOLIN HFA) 108 (90 BASE) MCG/ACT inhaler Inhale 2 puffs into the lungs every 4 (four) hours as needed for shortness of breath.  . allopurinol (ZYLOPRIM) 100 MG tablet Take 100 mg by mouth daily as needed.   [name] amitriptyline (ELAVIL) 25 MG tablet Take 50 mg by mouth at bedtime.   . Black Cohosh 40 MG CAPS Take 40 mg by  mouth 2 (two) times daily.  . carvedilol (COREG) 6.25 MG tablet Take 1 tablet (6.25 mg total) by mouth 2 (two) times daily. Please keep upcoming appt for refills. Thanks!  . cephALEXin (KEFLEX) 500 MG capsule Take 500 mg by mouth 4 (four) times daily.  . cetirizine (ZYRTEC) 10 MG tablet Take 10 mg by mouth daily.  . Cholecalciferol (VITAMIN D-3) 1000 units CAPS Take 1 capsule by mouth daily.  [name] esomeprazole (NEXIUM) 20 MG capsule Take 20 mg by mouth daily at 12 noon.  . Evolocumab (REPATHA SURECLICK) [id number] MG/ML SOAJ Inject 140 mg into the skin every 14 (fourteen) days.  . furosemide (LASIX) 40 MG tablet TAKE 1 TABLET (40 MG TOTAL) BY MOUTH DAILY. MAY TAKE EXTRA DAILY AS NEEDED FOR SWELLING  . gabapentin (NEURONTIN) 300 MG capsule Take 300 mg by mouth 3 (three) times daily.  [name] glimepiride (AMARYL) 2 MG tablet Take 2 mg by mouth daily.  . isosorbide mononitrate (IMDUR) 30 MG 24 hr tablet Take 1 tablet (30 mg total) by mouth daily. Please keep upcoming appt for refills. Thank you  . losartan (COZAAR) 50 MG tablet Take 25 mg by mouth daily.  . meclizine (ANTIVERT) 25 MG tablet Take 25 mg by mouth as needed for dizziness.   . Omega-3 Fatty Acids (FISH OIL) 1200 MG CAPS Take 1 capsule by mouth 2 (two) times daily.  [name]  polyethylene glycol (MIRALAX / GLYCOLAX) packet Take 17 g by mouth daily.  . potassium chloride SA (KLOR-CON) 20 MEQ tablet Take 0.5 tablets (10 mEq total) by mouth daily. Please keep upcoming appt for refills. Thank you  . pravastatin (PRAVACHOL) 20 MG tablet Take by mouth.  . sitaGLIPtin (JANUVIA) 50 MG tablet Take 1 tablet by mouth daily.  . vitamin B-12 (CYANOCOBALAMIN) 100 MCG tablet Take 1 tablet by mouth daily.  [name] warfarin (COUMADIN) 1 MG tablet Take 1 mg by mouth daily.   No current facility-administered medications for this visit. (Other)      REVIEW OF SYSTEMS: ROS    Positive for: Gastrointestinal, Genitourinary, Endocrine, Cardiovascular, Eyes, Respiratory   Negative  for: Constitutional, Neurological, Skin, Musculoskeletal, HENT, Psychiatric, Allergic/Imm, Heme/Lymph   Last edited by Theodore Demark, COA on 08/17/2019  1:52 PM. (History)       ALLERGIES Allergies  Allergen Reactions  . Vioxx [Rofecoxib] Shortness Of Breath  . Metformin And Related     Kidney failure  . Penicillins     rash  . Pravastatin   . Codeine Rash  . Motrin [Ibuprofen] Rash  . Sulfur Rash    PAST MEDICAL HISTORY Past Medical History:  Diagnosis Date  . Antral gastritis    EGD 11/15  . Asthmatic bronchitis   . Back pain   . Chronic diastolic heart failure (HCC) 05/20/2015   Grade 2 diastolic dysfunction.  04/2015.  [name] Chronic kidney disease    kidney function low  . Diabetes mellitus    x 5 yrs  . DVT of axillary vein, acute left ([place]) 07/24/12  . GERD (gastroesophageal reflux disease)   . Glaucoma   . Hyperlipidemia 05/20/2015  . Hypertension   . Hypothyroidism   . Kidney stones   . Mixed hyperlipidemia   . Peripheral venous insufficiency   . Pinched nerve    right elbow  . Sigmoid diverticulitis   . Vertigo    chonic   Past Surgical History:  Procedure Laterality Date  . ABDOMINAL HYSTERECTOMY    . BACK SURGERY     spinal   . CARDIAC CATHETERIZATION N/A 05/26/2015   Procedure: Left Heart Cath and Coronary Angiography;  Surgeon: Jettie Booze, MD;  Location: [facility];  Service: Cardiovascular;  Laterality: N/A;  . CATARACT EXTRACTION Bilateral   . CHOLECYSTECTOMY    . COLON SURGERY    . COLONOSCOPY N/A 12/27/2013   Procedure: COLONOSCOPY;  Surgeon: Rogene Houston, MD;  [facility];  Service: Endoscopy;  Laterality: N/A;  200  . COLOSTOMY CLOSURE    . ESOPHAGOGASTRODUODENOSCOPY N/A 05/07/2014   Procedure: ESOPHAGOGASTRODUODENOSCOPY (EGD);  Surgeon: Rogene Houston, MD;  Location: [facility];  Service: Endoscopy;  Laterality: N/A;  . EYE SURGERY Bilateral    Cat Sx  . fracture left foot    . HERNIA REPAIR    . NM  MYOCAR PERF WALL MOTION  01/28/2009   Normal  . OTHER SURGICAL HISTORY     colostomy, colostomy reversal, for diverticulitis surgical hernia repair, arm surgery, neck surgery  . US ECHOCARDIOGRAPHY  02/11/2010   Mild MR,trace TR & AI    FAMILY HISTORY Family History  Problem Relation Age of Onset  . Other Mother 28       Cause unknown  . Cancer Mother        liver  . CVA Maternal Grandmother 90       deceased  . Heart disease Maternal  Grandmother   . Stroke Maternal Grandmother   . Heart attack Brother 35       deceased  . Cancer Sister 67       deceased  . Glaucoma Maternal Uncle     SOCIAL HISTORY Social History   Tobacco Use  . Smoking status: Former Smoker    Types: Cigarettes    Quit date: 12/10/2013    Years since quitting: 5.6  . Smokeless tobacco: Never Used  . Tobacco comment: Smoke 1-1 1/2 packs a day  Substance Use Topics  . Alcohol use: No  . Drug use: No         OPHTHALMIC EXAM:  Base Eye Exam    Visual Acuity (Snellen - Linear)      Right Left   Dist [place] 20/50 +1 20/30 -1   Dist ph [place] 20/30 -2 20/25 -2       Tonometry (Tonopen, 1:49 PM)      Right Left   Pressure 11 11       Pupils      Dark Light Shape React APD   Right 3 2 Round Brisk None   Left 3 2 Round Brisk None       Visual Fields (Counting fingers)      Left Right    Full Full       Extraocular Movement      Right Left    Full, Ortho Full, Ortho       Neuro/Psych    Oriented x3: Yes   Mood/Affect: Normal       Dilation    Both eyes: 1.0% Mydriacyl, 2.5% Phenylephrine @ 1:49 PM        Slit Lamp and Fundus Exam    Slit Lamp Exam      Right Left   Lids/Lashes Dermatochalasis - upper lid, Meibomian gland dysfunction Dermatochalasis - upper lid, Telangiectasia, mild Meibomian gland dysfunction   Conjunctiva/Sclera White and quiet White and quiet   Cornea 2+ Punctate epithelial erosions 1+ inferior Punctate epithelial erosions   Anterior Chamber Deep and quiet,  narrow temporal angle Deep and quiet, narrow temporal angle   Iris Round and moderately dilated to 5.22mm Round and moderately dilated to 5.57mm   Lens Posterior chamber intraocular lens Posterior chamber intraocular lens   Vitreous Vitreous syneresis Vitreous syneresis       Fundus Exam      Right Left   Disc Sharp rim, Pallor, +cupping, mild, temporal Peripapillary atrophy Pink and Sharp, temporal Peripapillary atrophy   C/D Ratio 0.75 0.6   Macula Blunted foveal reflex, +CNV with interval improvement in SRF, Drusen, trace cystic changes overlying nasal PED Flat, Blunted foveal reflex, drusen, Retinal pigment epithelial mottling, No heme or edema   Vessels Vascular attenuation, Tortuous Vascular attenuation, Tortuous   Periphery Attached; no heme Attached; no heme          IMAGING AND PROCEDURES  Imaging and Procedures for @TODAY @  OCT, Retina - OU - Both Eyes       Right Eye Quality was good. Central Foveal Thickness: 201. Progression has improved. Findings include subretinal fluid, intraretinal fluid, intraretinal hyper-reflective material, pigment epithelial detachment, subretinal hyper-reflective material, retinal drusen , normal foveal contour, outer retinal atrophy (Interval improvement in SRF centrally and mild residual SRF and IRF overlying nasal PED).   Left Eye Quality was good. Central Foveal Thickness: 214. Progression has been stable. Findings include normal foveal contour, no IRF, no SRF, retinal drusen .   Notes *  Images captured and stored on drive  Diagnosis / Impression:  OD: abnormal foveal contour, +SRHM/PED w/ overlying SRF/IRF -- Interval improvement in SRF centrally and mild residual SRF and IRF overlying nasal PED OS: NFP, no IRF/SRF; +drusen -- nonexudative ARMD  Clinical management:  See below  Abbreviations: NFP - Normal foveal profile. CME - cystoid macular edema. PED - pigment epithelial detachment. IRF - intraretinal fluid. SRF - subretinal  fluid. EZ - ellipsoid zone. ERM - epiretinal membrane. ORA - outer retinal atrophy. ORT - outer retinal tubulation. SRHM - subretinal hyper-reflective material        Intravitreal Injection, Pharmacologic Agent - OD - Right Eye       Time Out 08/17/2019. 1:49 PM. Confirmed correct patient, procedure, site, and patient consented.   Anesthesia Topical anesthesia was used. Anesthetic medications included Lidocaine 2%, Proparacaine 0.5%.   Procedure Preparation included 5% betadine to ocular surface, eyelid speculum. A supplied needle was used.   Injection:  1.25 mg Bevacizumab (AVASTIN) SOLN   NDC: [id number], Lot: [id number] , Expiration date: 09/19/2019   Route: Intravitreal, Site: Right Eye, Waste: 0 mL  Post-op Post injection exam found visual acuity of at least counting fingers. The patient tolerated the procedure well. There were no complications. The patient received written and verbal post procedure care education.                 ASSESSMENT/PLAN:    ICD-10-CM   1. Exudative age-related macular degeneration of right eye with active choroidal neovascularization (HCC)  H35.3211 Intravitreal Injection, Pharmacologic Agent - OD - Right Eye    Bevacizumab (AVASTIN) SOLN 1.25 mg  2. Retinal edema  H35.81 OCT, Retina - OU - Both Eyes  3. Intermediate stage nonexudative age-related macular degeneration of left eye  H35.3122   4. Diabetes mellitus type 2 without retinopathy ([place])  E11.9   5. Hypertensive retinopathy of both eyes  H35.033   6. Essential hypertension  I10   7. Pseudophakia of both eyes  Z96.1   8. Primary open angle glaucoma of both eyes, unspecified glaucoma stage  H40.1130     1,2. 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, OD    - s/p IVA OD #1 (12.11.20)  - delayed f/u from 4 wks to 8 wks due to recent passing of pt's husband  - FA 12.11.20 confirms +CNVM  - OCT today shows PED/CNVM w/ interval improvement in SRF overlying nasal PED and persistent  IRF OD  - BCVA OD improved to 20/30-2 from 20/50+1  - recommend IVA OD #2 today 02.12.21  - pt wishes to be treated with IVA OD #2  - RBA of procedure discussed, questions answered  - informed consent obtained  - see procedure note  - Avastin informed consent form signed and scanned on 12.11.2020 (OD)  - f/u in 4 wks -- DFE/OCT/possible injection  3. Age related macular degeneration, non-exudative, OS  - intermediate stage  - The incidence, anatomy, and pathology of dry AMD, risk of progression, and the AREDS and AREDS 2 study including smoking risks discussed with patient.  - Recommend amsler grid monitoring  4. Diabetes mellitus, type 2 without retinopathy  - The incidence, risk factors for progression, natural history and treatment options for diabetic retinopathy  were discussed with patient.    - The need for close monitoring of blood glucose, blood pressure, and serum lipids, avoiding cigarette or any type of tobacco, and the need for long term follow up was also discussed with patient.  - monitor  5,6. Hypertensive retinopathy OU - discussed importance of tight BP control - monitor  7. Pseudophakia OU  - s/p CE/IOL (Dr. Venetia Maxon)  - beautiful surgery, doing well  - monitor  8. POAG OU  - formerly managed by Dr. Venetia Maxon  - s/p laser w/ Dr. Venetia Maxon -- ?SLT  - IOP 11, 11  - currently on latanoprost QHS  - monitor   Ophthalmic Meds Ordered this visit:  Meds ordered this encounter  Medications  . Bevacizumab (AVASTIN) SOLN 1.25 mg       Return in about 4 weeks (around 09/14/2019), or DFE, OCT.  There are no Patient Instructions on file for this visit.   Explained the diagnoses, plan, and follow up with the patient and they expressed understanding.  Patient expressed understanding of the importance of proper follow up care.   This document serves as a record of services personally performed by Gardiner Sleeper, MD, PhD. It was created on their behalf by Ernest Mallick, OA, an ophthalmic assistant. The creation of this record is the provider's dictation and/or activities during the visit.    Electronically signed by: Ernest Mallick, OA 02.05.2021 12:13 AM   Gardiner Sleeper, M.D., Ph.D. Diseases & Surgery of the Retina and Vitreous [facility]  I have reviewed the above documentation for accuracy and completeness, and I agree with the above. Gardiner Sleeper, M.D., Ph.D. 08/18/19 12:13 AM    Abbreviations: M myopia (nearsighted); A astigmatism; H hyperopia (farsighted); P presbyopia; Mrx spectacle prescription;  CTL contact lenses; OD right eye; OS left eye; OU both eyes  XT exotropia; ET esotropia; PEK punctate epithelial keratitis; PEE punctate epithelial erosions; DES dry eye syndrome; MGD meibomian gland dysfunction; ATs artificial tears; PFAT's preservative free artificial tears; [place] nuclear sclerotic cataract; PSC posterior subcapsular cataract; ERM epi-retinal membrane; PVD posterior vitreous detachment; RD retinal detachment; DM diabetes mellitus; DR diabetic retinopathy; NPDR non-proliferative diabetic retinopathy; PDR proliferative diabetic retinopathy; CSME clinically significant macular edema; DME diabetic macular edema; dbh dot blot hemorrhages; CWS cotton wool spot; POAG primary open angle glaucoma; C/D cup-to-disc ratio; HVF humphrey visual field; GVF goldmann visual field; OCT optical coherence tomography; IOP intraocular pressure; BRVO Branch retinal vein occlusion; CRVO central retinal vein occlusion; CRAO central retinal artery occlusion; BRAO branch retinal artery occlusion; RT retinal tear; SB scleral buckle; PPV pars plana vitrectomy; VH Vitreous hemorrhage; PRP panretinal laser photocoagulation; IVK intravitreal kenalog; VMT vitreomacular traction; MH Macular hole;  NVD neovascularization of the disc; NVE neovascularization elsewhere; AREDS age related eye disease study; ARMD age related macular degeneration; POAG  primary open angle glaucoma; EBMD epithelial/anterior basement membrane dystrophy; ACIOL anterior chamber intraocular lens; IOL intraocular lens; PCIOL posterior chamber intraocular lens; Phaco/IOL phacoemulsification with intraocular lens placement; [place] photorefractive keratectomy; LASIK laser assisted in situ keratomileusis; HTN hypertension; DM diabetes mellitus; COPD chronic obstructive pulmonary disease

## 2019-08-14 ENCOUNTER — Telehealth: Payer: Self-pay | Admitting: Cardiovascular Disease

## 2019-08-14 MED ORDER — ISOSORBIDE MONONITRATE ER 30 MG PO TB24: 30.0000 mg | ORAL_TABLET | Freq: Every day | ORAL | 0 refills | Status: DC

## 2019-08-14 MED ORDER — CARVEDILOL 6.25 MG PO TABS: 6.2500 mg | ORAL_TABLET | Freq: Two times a day (BID) | ORAL | 0 refills | Status: DC

## 2019-08-14 MED ORDER — POTASSIUM CHLORIDE CRYS ER 20 MEQ PO TBCR: 10.0000 meq | EXTENDED_RELEASE_TABLET | Freq: Every day | ORAL | 0 refills | Status: DC

## 2019-08-14 NOTE — Telephone Encounter (Signed)
Rx sent to requested pharmacy. Advised to keep upcoming appt for further refills.

## 2019-08-14 NOTE — Telephone Encounter (Signed)
New Message  Received a call from [facility] and they informed me that the pt wanted to transfer her medication to   [facility] [address] [place], [place] [address]  Need these medications refilled  carvedilol (COREG) 6.25 MG tablet isosorbide mononitrate (IMDUR) 30 MG 24 hr tablet potassium chloride SA (KLOR-CON) 20 MEQ tablet

## 2019-08-16 ENCOUNTER — Inpatient Hospital Stay (HOSPITAL_COMMUNITY): Admission: RE | Admit: 2019-08-16 | Payer: Medicare Other | Source: Ambulatory Visit

## 2019-08-16 ENCOUNTER — Ambulatory Visit: Payer: Medicare Other | Admitting: Cardiovascular Disease

## 2019-08-17 ENCOUNTER — Encounter (INDEPENDENT_AMBULATORY_CARE_PROVIDER_SITE_OTHER): Payer: Self-pay | Admitting: Ophthalmology

## 2019-08-17 ENCOUNTER — Other Ambulatory Visit: Payer: Self-pay

## 2019-08-17 ENCOUNTER — Ambulatory Visit (INDEPENDENT_AMBULATORY_CARE_PROVIDER_SITE_OTHER): Payer: Medicare Other | Admitting: Ophthalmology

## 2019-08-17 DIAGNOSIS — H353122 Nonexudative age-related macular degeneration, left eye, intermediate dry stage: Secondary | ICD-10-CM | POA: Diagnosis not present

## 2019-08-17 DIAGNOSIS — E119 Type 2 diabetes mellitus without complications: Secondary | ICD-10-CM | POA: Diagnosis not present

## 2019-08-17 DIAGNOSIS — H35033 Hypertensive retinopathy, bilateral: Secondary | ICD-10-CM | POA: Diagnosis not present

## 2019-08-17 DIAGNOSIS — H353211 Exudative age-related macular degeneration, right eye, with active choroidal neovascularization: Secondary | ICD-10-CM

## 2019-08-17 DIAGNOSIS — H3581 Retinal edema: Secondary | ICD-10-CM

## 2019-08-17 DIAGNOSIS — Z961 Presence of intraocular lens: Secondary | ICD-10-CM

## 2019-08-17 DIAGNOSIS — I10 Essential (primary) hypertension: Secondary | ICD-10-CM

## 2019-08-17 DIAGNOSIS — H401130 Primary open-angle glaucoma, bilateral, stage unspecified: Secondary | ICD-10-CM

## 2019-08-18 ENCOUNTER — Encounter (INDEPENDENT_AMBULATORY_CARE_PROVIDER_SITE_OTHER): Payer: Self-pay | Admitting: Ophthalmology

## 2019-08-18 MED ORDER — BEVACIZUMAB CHEMO INJECTION 1.25MG/0.05ML SYRINGE FOR KALEIDOSCOPE
1.2500 mg | INTRAVITREAL | Status: AC | PRN
  Administered 2019-08-18: 1.25 mg via INTRAVITREAL

## 2019-08-22 DIAGNOSIS — Z7901 Long term (current) use of anticoagulants: Secondary | ICD-10-CM | POA: Diagnosis not present

## 2019-09-02 DIAGNOSIS — E1165 Type 2 diabetes mellitus with hyperglycemia: Secondary | ICD-10-CM | POA: Diagnosis not present

## 2019-09-02 DIAGNOSIS — I2510 Atherosclerotic heart disease of native coronary artery without angina pectoris: Secondary | ICD-10-CM | POA: Diagnosis not present

## 2019-09-02 DIAGNOSIS — H353211 Exudative age-related macular degeneration, right eye, with active choroidal neovascularization: Secondary | ICD-10-CM | POA: Diagnosis not present

## 2019-09-02 DIAGNOSIS — I10 Essential (primary) hypertension: Secondary | ICD-10-CM | POA: Diagnosis not present

## 2019-09-13 NOTE — Progress Notes (Signed)
[facility] Note  09/14/2019     CHIEF COMPLAINT Patient presents for Retina Follow Up   HISTORY OF PRESENT ILLNESS: [name] is a 79 y.o. female who presents to the clinic today for:   HPI    Retina Follow Up    Patient presents with  Wet AMD.  In right eye.  This started months ago.  Severity is moderate.  Duration of 4 months.  Since onset it is stable.  I, the attending physician,  performed the HPI with the patient and updated documentation appropriately.          Comments    79 y/o female pt here for 4 wk f/u for exu ARMD OD.  No change in [place] OU.  Denies pain, flashes.  Has occasional floaters OU.  Latanoprost QHS OU.       Last edited by Bernarda Caffey, MD on 09/14/2019  2:12 PM. (History)    Patient    Referring physician: Sharilyn Sites, MD [address] [place],  [place] [address]  HISTORICAL INFORMATION:   Selected notes from the MEDICAL RECORD NUMBER Referred by Dr. Madelin Headings for concern of SRF OD LEE: 11.27.20 (M. Cotter) [BCVA: OD: 20/80-- OS: 20/60-]  Ocular Hx-glaucoma (latanoprost)  PMH-DM    CURRENT MEDICATIONS: Current Outpatient Medications (Ophthalmic Drugs)  Medication Sig  . latanoprost (XALATAN) 0.005 % ophthalmic solution Place 1 drop into both eyes at bedtime.   [name] tetrahydrozoline 0.05 % ophthalmic solution Place 1 drop into both eyes at bedtime.   No current facility-administered medications for this visit. (Ophthalmic Drugs)   Current Outpatient Medications (Other)  Medication Sig  . albuterol (PROVENTIL HFA;VENTOLIN HFA) 108 (90 BASE) MCG/ACT inhaler Inhale 2 puffs into the lungs every 4 (four) hours as needed for shortness of breath.  . allopurinol (ZYLOPRIM) 100 MG tablet Take 100 mg by mouth daily as needed.   [name] amitriptyline (ELAVIL) 25 MG tablet Take 50 mg by mouth at bedtime.   . Black Cohosh 40 MG CAPS Take 40 mg by mouth 2 (two) times daily.  . carvedilol (COREG) 6.25 MG tablet Take 1  tablet (6.25 mg total) by mouth 2 (two) times daily. Please keep upcoming appt for refills. Thanks!  . cephALEXin (KEFLEX) 500 MG capsule Take 500 mg by mouth 4 (four) times daily.  . cetirizine (ZYRTEC) 10 MG tablet Take 10 mg by mouth daily.  . Cholecalciferol (VITAMIN D-3) 1000 units CAPS Take 1 capsule by mouth daily.  [name] esomeprazole (NEXIUM) 20 MG capsule Take 20 mg by mouth daily at 12 noon.  . Evolocumab (REPATHA SURECLICK) [id number] MG/ML SOAJ Inject 140 mg into the skin every 14 (fourteen) days.  [name] FLUBLOK QUADRIVALENT 0.5 ML injection   . furosemide (LASIX) 40 MG tablet TAKE 1 TABLET (40 MG TOTAL) BY MOUTH DAILY. MAY TAKE EXTRA DAILY AS NEEDED FOR SWELLING  . gabapentin (NEURONTIN) 300 MG capsule Take 300 mg by mouth 3 (three) times daily.  [name] glimepiride (AMARYL) 2 MG tablet Take 2 mg by mouth daily.  . isosorbide mononitrate (IMDUR) 30 MG 24 hr tablet Take 1 tablet (30 mg total) by mouth daily. Please keep upcoming appt for refills. Thank you  . losartan (COZAAR) 50 MG tablet Take 25 mg by mouth daily.  . meclizine (ANTIVERT) 25 MG tablet Take 25 mg by mouth as needed for dizziness.   . Omega-3 Fatty Acids (FISH OIL) 1200 MG CAPS Take 1 capsule by mouth 2 (two) times  daily.  . polyethylene glycol (MIRALAX / GLYCOLAX) packet Take 17 g by mouth daily.  . potassium chloride SA (KLOR-CON) 20 MEQ tablet Take 0.5 tablets (10 mEq total) by mouth daily. Please keep upcoming appt for refills. Thank you  . pravastatin (PRAVACHOL) 20 MG tablet Take by mouth.  . sitaGLIPtin (JANUVIA) 50 MG tablet Take 1 tablet by mouth daily.  . vitamin B-12 (CYANOCOBALAMIN) 100 MCG tablet Take 1 tablet by mouth daily.  [name] warfarin (COUMADIN) 1 MG tablet Take 1 mg by mouth daily.   No current facility-administered medications for this visit. (Other)      REVIEW OF SYSTEMS: ROS    Positive for: Gastrointestinal, Genitourinary, Endocrine, Cardiovascular, Eyes   Negative for: Constitutional, Neurological, Skin,  Musculoskeletal, HENT, Respiratory, Psychiatric, Allergic/Imm, Heme/Lymph   Last edited by Matthew Folks, COA on 09/14/2019  1:59 PM. (History)       ALLERGIES Allergies  Allergen Reactions  . Vioxx [Rofecoxib] Shortness Of Breath  . Metformin And Related     Kidney failure  . Penicillins     rash  . Pravastatin   . Codeine Rash  . Motrin [Ibuprofen] Rash  . Sulfur Rash    PAST MEDICAL HISTORY Past Medical History:  Diagnosis Date  . Antral gastritis    EGD 11/15  . Asthmatic bronchitis   . Back pain   . Chronic diastolic heart failure (HCC) 05/20/2015   Grade 2 diastolic dysfunction.  04/2015.  [name] Chronic kidney disease    kidney function low  . Diabetes mellitus    x 5 yrs  . DVT of axillary vein, acute left ([place]) 07/24/12  . GERD (gastroesophageal reflux disease)   . Glaucoma    POAG OU  . Hyperlipidemia 05/20/2015  . Hypertension   . Hypertensive retinopathy    OU  . Hypothyroidism   . Kidney stones   . Macular degeneration    Wet OD, Dry OS  . Mixed hyperlipidemia   . Peripheral venous insufficiency   . Pinched nerve    right elbow  . Sigmoid diverticulitis   . Vertigo    chonic   Past Surgical History:  Procedure Laterality Date  . ABDOMINAL HYSTERECTOMY    . BACK SURGERY     spinal   . CARDIAC CATHETERIZATION N/A 05/26/2015   Procedure: Left Heart Cath and Coronary Angiography;  Surgeon: Jettie Booze, MD;  Location: [facility];  Service: Cardiovascular;  Laterality: N/A;  . CATARACT EXTRACTION Bilateral   . CHOLECYSTECTOMY    . COLON SURGERY    . COLONOSCOPY N/A 12/27/2013   Procedure: COLONOSCOPY;  Surgeon: Rogene Houston, MD;  [facility];  Service: Endoscopy;  Laterality: N/A;  200  . COLOSTOMY CLOSURE    . ESOPHAGOGASTRODUODENOSCOPY N/A 05/07/2014   Procedure: ESOPHAGOGASTRODUODENOSCOPY (EGD);  Surgeon: Rogene Houston, MD;  [facility]: [facility];  Service: Endoscopy;  Laterality: N/A;  . EYE SURGERY Bilateral     Cat Sx  . fracture left foot    . HERNIA REPAIR    . NM MYOCAR PERF WALL MOTION  01/28/2009   Normal  . OTHER SURGICAL HISTORY     colostomy, colostomy reversal, for diverticulitis surgical hernia repair, arm surgery, neck surgery  . US ECHOCARDIOGRAPHY  02/11/2010   Mild MR,trace TR & AI    FAMILY HISTORY Family History  Problem Relation Age of Onset  . Other Mother 23       Cause unknown  . Cancer Mother  liver  . CVA Maternal Grandmother 90       deceased  . Heart disease Maternal Grandmother   . Stroke Maternal Grandmother   . Heart attack Brother 64       deceased  . Cancer Sister 27       deceased  . Glaucoma Maternal Uncle     SOCIAL HISTORY Social History   Tobacco Use  . Smoking status: Former Smoker    Types: Cigarettes    Quit date: 12/10/2013    Years since quitting: 5.7  . Smokeless tobacco: Never Used  . Tobacco comment: Smoke 1-1 1/2 packs a day  Substance Use Topics  . Alcohol use: No  . Drug use: No         OPHTHALMIC EXAM:  Base Eye Exam    Visual Acuity (Snellen - Linear)      Right Left   Dist [place] 20/30 -2 20/25 -2   Dist ph [place] NI NI       Tonometry (Tonopen, 2:04 PM)      Right Left   Pressure 15 15       Pupils      Dark Light Shape React APD   Right 3 2 Round Brisk None   Left 3 2 Round Brisk None       Visual Fields (Counting fingers)      Left Right    Full Full       Extraocular Movement      Right Left    Full, Ortho Full, Ortho       Neuro/Psych    Oriented x3: Yes   Mood/Affect: Normal       Dilation    Both eyes: 1.0% Mydriacyl, 2.5% Phenylephrine @ 2:03 PM        Slit Lamp and Fundus Exam    Slit Lamp Exam      Right Left   Lids/Lashes Dermatochalasis - upper lid, Meibomian gland dysfunction Dermatochalasis - upper lid, Telangiectasia, mild Meibomian gland dysfunction   Conjunctiva/Sclera White and quiet White and quiet   Cornea 2+ Punctate epithelial erosions 1+ inferior Punctate  epithelial erosions   Anterior Chamber Deep and quiet, narrow temporal angle Deep and quiet, narrow temporal angle   Iris Round and moderately dilated to 5.25mm Round and moderately dilated to 5.74mm   Lens Posterior chamber intraocular lens Posterior chamber intraocular lens   Vitreous Vitreous syneresis Vitreous syneresis       Fundus Exam      Right Left   Disc Sharp rim, Pallor, +cupping, mild, temporal Peripapillary atrophy Pink and Sharp, temporal Peripapillary atrophy   C/D Ratio 0.75 0.6   Macula Blunted foveal reflex, +CNV with interval improvement in SRF, Drusen, trace cystic changes overlying nasal PED -- improved Flat, Blunted foveal reflex, drusen, Retinal pigment epithelial mottling, No heme or edema   Vessels Vascular attenuation, Tortuous Vascular attenuation, Tortuous   Periphery Attached; no heme Attached; no heme          IMAGING AND PROCEDURES  Imaging and Procedures for @TODAY @  OCT, Retina - OU - Both Eyes       Right Eye Quality was good. Central Foveal Thickness: 197. Progression has improved. Findings include subretinal fluid, intraretinal fluid, intraretinal hyper-reflective material, pigment epithelial detachment, subretinal hyper-reflective material, retinal drusen , normal foveal contour, outer retinal atrophy (Interval improvement in IRF/SRF overlying PED nasal macula).   Left Eye Quality was good. Central Foveal Thickness: 212. Progression has been stable. Findings include  normal foveal contour, no IRF, no SRF, retinal drusen .   Notes *Images captured and stored on drive  Diagnosis / Impression:  OD: abnormal foveal contour, +SRHM/PED w/ interval improvement in IRF/SRF overlying PED nasal macula OS: NFP, no IRF/SRF; +drusen -- nonexudative ARMD  Clinical management:  See below  Abbreviations: NFP - Normal foveal profile. CME - cystoid macular edema. PED - pigment epithelial detachment. IRF - intraretinal fluid. SRF - subretinal fluid. EZ -  ellipsoid zone. ERM - epiretinal membrane. ORA - outer retinal atrophy. ORT - outer retinal tubulation. SRHM - subretinal hyper-reflective material        Intravitreal Injection, Pharmacologic Agent - OD - Right Eye       Time Out 09/14/2019. 2:16 PM. Confirmed correct patient, procedure, site, and patient consented.   Anesthesia Topical anesthesia was used. Anesthetic medications included Lidocaine 2%, Proparacaine 0.5%.   Procedure Preparation included 5% betadine to ocular surface, eyelid speculum. A supplied needle was used.   Injection:  1.25 mg Bevacizumab (AVASTIN) SOLN   NDC: [id number], Lot: [id number] , Expiration date: 10/30/2019   Route: Intravitreal, Site: Right Eye, Waste: 0 mL  Post-op Post injection exam found visual acuity of at least counting fingers. The patient tolerated the procedure well. There were no complications. The patient received written and verbal post procedure care education.                 ASSESSMENT/PLAN:    ICD-10-CM   1. Exudative age-related macular degeneration of right eye with active choroidal neovascularization (HCC)  H35.3211 Intravitreal Injection, Pharmacologic Agent - OD - Right Eye    Bevacizumab (AVASTIN) SOLN 1.25 mg  2. Retinal edema  H35.81 OCT, Retina - OU - Both Eyes  3. Intermediate stage nonexudative age-related macular degeneration of left eye  H35.3122   4. Diabetes mellitus type 2 without retinopathy ([place])  E11.9   5. Hypertensive retinopathy of both eyes  H35.033   6. Essential hypertension  I10   7. Pseudophakia of both eyes  Z96.1   8. Primary open angle glaucoma of both eyes, unspecified glaucoma stage  H40.1130     1,2. 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, OD    - delayed follow up from 4 weeks to 8 weeks due to recent passing of husband (12.11.20-02.12.21  - s/p IVA OD #1 (12.11.20), #2 (02.12.21)  - FA 12.11.20 confirms +CNVM  - OCT today shows PED/CNVM w/ interval improvement in SRF and IRF  overlying nasal PED  - BCVA OD stably improved to 20/30-2 from 20/50+1  - recommend IVA OD #3 today 03.12.21  - pt wishes to be treated with IVA OD #3  - RBA of procedure discussed, questions answered  - informed consent obtained  - see procedure note  - Avastin informed consent form signed and scanned on 12.11.2020 (OD)  - f/u in 4 wks -- DFE/OCT/possible injection  3. Age related macular degeneration, non-exudative, OS  - intermediate stage  - The incidence, anatomy, and pathology of dry AMD, risk of progression, and the AREDS and AREDS 2 study including smoking risks discussed with patient.  - Recommend amsler grid monitoring  4. Diabetes mellitus, type 2 without retinopathy  - The incidence, risk factors for progression, natural history and treatment options for diabetic retinopathy  were discussed with patient.    - The need for close monitoring of blood glucose, blood pressure, and serum lipids, avoiding cigarette or any type of tobacco, and the need for long term follow up was also  discussed with patient.  - monitor  5,6. Hypertensive retinopathy OU  - discussed importance of tight BP control  - monitor  7. Pseudophakia OU  - s/p CE/IOL (Dr. Venetia Maxon)  - beautiful surgery, doing well  - monitor  8. POAG OU  - formerly managed by Dr. Venetia Maxon  - s/p laser w/ Dr. Venetia Maxon -- ?SLT  - IOP 15 OU  - currently on latanoprost QHS  - monitor   Ophthalmic Meds Ordered this visit:  Meds ordered this encounter  Medications  . Bevacizumab (AVASTIN) SOLN 1.25 mg       Return in about 4 weeks (around 10/12/2019) for 4 wk f/u for exu ARMD OD w/DFE/OCT.  There are no Patient Instructions on file for this visit.   Explained the diagnoses, plan, and follow up with the patient and they expressed understanding.  Patient expressed understanding of the importance of proper follow up care.   This document serves as a record of services personally performed by Gardiner Sleeper, MD,  PhD. It was created on their behalf by Ernest Mallick, OA, an ophthalmic assistant. The creation of this record is the provider's dictation and/or activities during the visit.    Electronically signed by: Ernest Mallick, OA 03.11.2021 2:30 PM   Gardiner Sleeper, M.D., Ph.D. Diseases & Surgery of the Retina and Vitreous [facility]  I have reviewed the above documentation for accuracy and completeness, and I agree with the above. Gardiner Sleeper, M.D., Ph.D. 09/14/19 2:30 PM    Abbreviations: M myopia (nearsighted); A astigmatism; H hyperopia (farsighted); P presbyopia; Mrx spectacle prescription;  CTL contact lenses; OD right eye; OS left eye; OU both eyes  XT exotropia; ET esotropia; PEK punctate epithelial keratitis; PEE punctate epithelial erosions; DES dry eye syndrome; MGD meibomian gland dysfunction; ATs artificial tears; PFAT's preservative free artificial tears; [place] nuclear sclerotic cataract; PSC posterior subcapsular cataract; ERM epi-retinal membrane; PVD posterior vitreous detachment; RD retinal detachment; DM diabetes mellitus; DR diabetic retinopathy; NPDR non-proliferative diabetic retinopathy; PDR proliferative diabetic retinopathy; CSME clinically significant macular edema; DME diabetic macular edema; dbh dot blot hemorrhages; CWS cotton wool spot; POAG primary open angle glaucoma; C/D cup-to-disc ratio; HVF humphrey visual field; GVF goldmann visual field; OCT optical coherence tomography; IOP intraocular pressure; BRVO Branch retinal vein occlusion; CRVO central retinal vein occlusion; CRAO central retinal artery occlusion; BRAO branch retinal artery occlusion; RT retinal tear; SB scleral buckle; PPV pars plana vitrectomy; VH Vitreous hemorrhage; PRP panretinal laser photocoagulation; IVK intravitreal kenalog; VMT vitreomacular traction; MH Macular hole;  NVD neovascularization of the disc; NVE neovascularization elsewhere; AREDS age related eye disease study; ARMD  age related macular degeneration; POAG primary open angle glaucoma; EBMD epithelial/anterior basement membrane dystrophy; ACIOL anterior chamber intraocular lens; IOL intraocular lens; PCIOL posterior chamber intraocular lens; Phaco/IOL phacoemulsification with intraocular lens placement; [place] photorefractive keratectomy; LASIK laser assisted in situ keratomileusis; HTN hypertension; DM diabetes mellitus; COPD chronic obstructive pulmonary disease

## 2019-09-14 ENCOUNTER — Ambulatory Visit (INDEPENDENT_AMBULATORY_CARE_PROVIDER_SITE_OTHER): Payer: Medicare Other | Admitting: Ophthalmology

## 2019-09-14 ENCOUNTER — Encounter (INDEPENDENT_AMBULATORY_CARE_PROVIDER_SITE_OTHER): Payer: Self-pay | Admitting: Ophthalmology

## 2019-09-14 DIAGNOSIS — H353211 Exudative age-related macular degeneration, right eye, with active choroidal neovascularization: Secondary | ICD-10-CM | POA: Diagnosis not present

## 2019-09-14 DIAGNOSIS — E119 Type 2 diabetes mellitus without complications: Secondary | ICD-10-CM

## 2019-09-14 DIAGNOSIS — H401130 Primary open-angle glaucoma, bilateral, stage unspecified: Secondary | ICD-10-CM

## 2019-09-14 DIAGNOSIS — Z961 Presence of intraocular lens: Secondary | ICD-10-CM

## 2019-09-14 DIAGNOSIS — H35033 Hypertensive retinopathy, bilateral: Secondary | ICD-10-CM

## 2019-09-14 DIAGNOSIS — H353122 Nonexudative age-related macular degeneration, left eye, intermediate dry stage: Secondary | ICD-10-CM | POA: Diagnosis not present

## 2019-09-14 DIAGNOSIS — H3581 Retinal edema: Secondary | ICD-10-CM | POA: Diagnosis not present

## 2019-09-14 DIAGNOSIS — I10 Essential (primary) hypertension: Secondary | ICD-10-CM

## 2019-09-14 MED ORDER — BEVACIZUMAB CHEMO INJECTION 1.25MG/0.05ML SYRINGE FOR KALEIDOSCOPE
1.2500 mg | INTRAVITREAL | Status: AC | PRN
  Administered 2019-09-14: 1.25 mg via INTRAVITREAL

## 2019-09-18 ENCOUNTER — Encounter (INDEPENDENT_AMBULATORY_CARE_PROVIDER_SITE_OTHER): Payer: Self-pay | Admitting: Ophthalmology

## 2019-09-18 ENCOUNTER — Other Ambulatory Visit: Payer: Self-pay

## 2019-09-18 ENCOUNTER — Ambulatory Visit (INDEPENDENT_AMBULATORY_CARE_PROVIDER_SITE_OTHER): Payer: Medicare Other | Admitting: Ophthalmology

## 2019-09-18 DIAGNOSIS — H35033 Hypertensive retinopathy, bilateral: Secondary | ICD-10-CM

## 2019-09-18 DIAGNOSIS — Z961 Presence of intraocular lens: Secondary | ICD-10-CM

## 2019-09-18 DIAGNOSIS — H353122 Nonexudative age-related macular degeneration, left eye, intermediate dry stage: Secondary | ICD-10-CM | POA: Diagnosis not present

## 2019-09-18 DIAGNOSIS — I10 Essential (primary) hypertension: Secondary | ICD-10-CM

## 2019-09-18 DIAGNOSIS — H3581 Retinal edema: Secondary | ICD-10-CM | POA: Diagnosis not present

## 2019-09-18 DIAGNOSIS — H353211 Exudative age-related macular degeneration, right eye, with active choroidal neovascularization: Secondary | ICD-10-CM

## 2019-09-18 DIAGNOSIS — E119 Type 2 diabetes mellitus without complications: Secondary | ICD-10-CM | POA: Diagnosis not present

## 2019-09-18 DIAGNOSIS — H401130 Primary open-angle glaucoma, bilateral, stage unspecified: Secondary | ICD-10-CM

## 2019-09-18 NOTE — Progress Notes (Signed)
[facility] Note  09/18/2019     CHIEF COMPLAINT Patient presents for Retina Follow Up   HISTORY OF PRESENT ILLNESS: [name] is a 79 y.o. female who presents to the clinic today for:   HPI    Retina Follow Up    Patient presents with  Other.  This started 4 days ago.  Severity is moderate.  I, the attending physician,  performed the HPI with the patient and updated documentation appropriately.          Comments    Patient here for 4 days retina follow up for problem after injection. Patient states vision I better today but has been real, real blurry. Still sees some floaters today. No eye pain now. Yesterday felt like gravel or sand in eye. Vision was so bad was afraid to drive. Was told to get some eye drops. Used systane.       Last edited by Bernarda Caffey, MD on 09/18/2019  1:27 PM. (History)    Patient states her vision has decreased in her right eye since her last injection here (03.12), she states yesterday it felt like it had sand in it, she says Sunday her vision was so blurry she was afraid to drive to church, she is using Systane 3-4 times a day, she states her vision is better today   Referring physician: Sharilyn Sites, MD [address] [place],  [place] [address]  HISTORICAL INFORMATION:   Selected notes from the [facility] Referred by Dr. Madelin Headings for concern of SRF OD LEE: 11.27.20 (M. Cotter) [BCVA: OD: 20/80-- OS: 20/60-]  Ocular Hx-glaucoma (latanoprost)  PMH-DM    CURRENT MEDICATIONS: Current Outpatient Medications (Ophthalmic Drugs)  Medication Sig  . latanoprost (XALATAN) 0.005 % ophthalmic solution Place 1 drop into both eyes at bedtime.   [name] tetrahydrozoline 0.05 % ophthalmic solution Place 1 drop into both eyes at bedtime.   No current facility-administered medications for this visit. (Ophthalmic Drugs)   Current Outpatient Medications (Other)  Medication Sig  . albuterol (PROVENTIL  HFA;VENTOLIN HFA) 108 (90 BASE) MCG/ACT inhaler Inhale 2 puffs into the lungs every 4 (four) hours as needed for shortness of breath.  . allopurinol (ZYLOPRIM) 100 MG tablet Take 100 mg by mouth daily as needed.   [name] amitriptyline (ELAVIL) 25 MG tablet Take 50 mg by mouth at bedtime.   . Black Cohosh 40 MG CAPS Take 40 mg by mouth 2 (two) times daily.  . carvedilol (COREG) 6.25 MG tablet Take 1 tablet (6.25 mg total) by mouth 2 (two) times daily. Please keep upcoming appt for refills. Thanks!  . cephALEXin (KEFLEX) 500 MG capsule Take 500 mg by mouth 4 (four) times daily.  . cetirizine (ZYRTEC) 10 MG tablet Take 10 mg by mouth daily.  . Cholecalciferol (VITAMIN D-3) 1000 units CAPS Take 1 capsule by mouth daily.  [name] esomeprazole (NEXIUM) 20 MG capsule Take 20 mg by mouth daily at 12 noon.  . Evolocumab (REPATHA SURECLICK) [id number] MG/ML SOAJ Inject 140 mg into the skin every 14 (fourteen) days.  [name] FLUBLOK QUADRIVALENT 0.5 ML injection   . furosemide (LASIX) 40 MG tablet TAKE 1 TABLET (40 MG TOTAL) BY MOUTH DAILY. MAY TAKE EXTRA DAILY AS NEEDED FOR SWELLING  . gabapentin (NEURONTIN) 300 MG capsule Take 300 mg by mouth 3 (three) times daily.  [name] glimepiride (AMARYL) 2 MG tablet Take 2 mg by mouth daily.  . isosorbide mononitrate (IMDUR) 30 MG 24 hr  tablet Take 1 tablet (30 mg total) by mouth daily. Please keep upcoming appt for refills. Thank you  . losartan (COZAAR) 50 MG tablet Take 25 mg by mouth daily.  . meclizine (ANTIVERT) 25 MG tablet Take 25 mg by mouth as needed for dizziness.   . Omega-3 Fatty Acids (FISH OIL) 1200 MG CAPS Take 1 capsule by mouth 2 (two) times daily.  . polyethylene glycol (MIRALAX / GLYCOLAX) packet Take 17 g by mouth daily.  . potassium chloride SA (KLOR-CON) 20 MEQ tablet Take 0.5 tablets (10 mEq total) by mouth daily. Please keep upcoming appt for refills. Thank you  . pravastatin (PRAVACHOL) 20 MG tablet Take by mouth.  . sitaGLIPtin (JANUVIA) 50 MG tablet Take 1 tablet by  mouth daily.  . vitamin B-12 (CYANOCOBALAMIN) 100 MCG tablet Take 1 tablet by mouth daily.  [name] warfarin (COUMADIN) 1 MG tablet Take 1 mg by mouth daily.   No current facility-administered medications for this visit. (Other)      REVIEW OF SYSTEMS: ROS    Positive for: Gastrointestinal, Genitourinary, Endocrine, Cardiovascular, Eyes   Negative for: Constitutional, Neurological, Skin, Musculoskeletal, HENT, Respiratory, Psychiatric, Allergic/Imm, Heme/Lymph   Last edited by Theodore Demark, COA on 09/18/2019  1:10 PM. (History)       ALLERGIES Allergies  Allergen Reactions  . Vioxx [Rofecoxib] Shortness Of Breath  . Metformin And Related     Kidney failure  . Penicillins     rash  . Pravastatin   . Codeine Rash  . Motrin [Ibuprofen] Rash  . Sulfur Rash    PAST MEDICAL HISTORY Past Medical History:  Diagnosis Date  . Antral gastritis    EGD 11/15  . Asthmatic bronchitis   . Back pain   . Chronic diastolic heart failure (HCC) 05/20/2015   Grade 2 diastolic dysfunction.  04/2015.  [name] Chronic kidney disease    kidney function low  . Diabetes mellitus    x 5 yrs  . DVT of axillary vein, acute left ([place]) 07/24/12  . GERD (gastroesophageal reflux disease)   . Glaucoma    POAG OU  . Hyperlipidemia 05/20/2015  . Hypertension   . Hypertensive retinopathy    OU  . Hypothyroidism   . Kidney stones   . Macular degeneration    Wet OD, Dry OS  . Mixed hyperlipidemia   . Peripheral venous insufficiency   . Pinched nerve    right elbow  . Sigmoid diverticulitis   . Vertigo    chonic   Past Surgical History:  Procedure Laterality Date  . ABDOMINAL HYSTERECTOMY    . BACK SURGERY     spinal   . CARDIAC CATHETERIZATION N/A 05/26/2015   Procedure: Left Heart Cath and Coronary Angiography;  Surgeon: Jettie Booze, MD;  Location: [facility];  Service: Cardiovascular;  Laterality: N/A;  . CATARACT EXTRACTION Bilateral   . CHOLECYSTECTOMY    . COLON SURGERY     . COLONOSCOPY N/A 12/27/2013   Procedure: COLONOSCOPY;  Surgeon: Rogene Houston, MD;  Location: [facility];  Service: Endoscopy;  Laterality: N/A;  200  . COLOSTOMY CLOSURE    . ESOPHAGOGASTRODUODENOSCOPY N/A 05/07/2014   Procedure: ESOPHAGOGASTRODUODENOSCOPY (EGD);  Surgeon: Rogene Houston, MD;  Location: [facility];  Service: Endoscopy;  Laterality: N/A;  . EYE SURGERY Bilateral    Cat Sx  . fracture left foot    . HERNIA REPAIR    . NM MYOCAR PERF WALL MOTION  01/28/2009  Normal  . OTHER SURGICAL HISTORY     colostomy, colostomy reversal, for diverticulitis surgical hernia repair, arm surgery, neck surgery  . US ECHOCARDIOGRAPHY  02/11/2010   Mild MR,trace TR & AI    FAMILY HISTORY Family History  Problem Relation Age of Onset  . Other Mother 7       Cause unknown  . Cancer Mother        liver  . CVA Maternal Grandmother 90       deceased  . Heart disease Maternal Grandmother   . Stroke Maternal Grandmother   . Heart attack Brother 31       deceased  . Cancer Sister 60       deceased  . Glaucoma Maternal Uncle     SOCIAL HISTORY Social History   Tobacco Use  . Smoking status: Former Smoker    Types: Cigarettes    Quit date: 12/10/2013    Years since quitting: 5.7  . Smokeless tobacco: Never Used  . Tobacco comment: Smoke 1-1 1/2 packs a day  Substance Use Topics  . Alcohol use: No  . Drug use: No         OPHTHALMIC EXAM:  Base Eye Exam    Visual Acuity (Snellen - Linear)      Right Left   Dist [place] 20/100 -1 20/25 -1   Dist ph [place] NI 20/25 +2       Tonometry (Tonopen, 1:06 PM)      Right Left   Pressure 14 15       Pupils      Dark Light Shape React APD   Right 3 2 Round Brisk None   Left 3 2 Round Brisk None       Visual Fields (Counting fingers)      Left Right    Full Full       Extraocular Movement      Right Left    Full, Ortho Full, Ortho       Neuro/Psych    Oriented x3: Yes   Mood/Affect: Normal       Dilation     Both eyes: 1.0% Mydriacyl, 2.5% Phenylephrine @ 1:04 PM        Slit Lamp and Fundus Exam    Slit Lamp Exam      Right Left   Lids/Lashes Dermatochalasis - upper lid, Meibomian gland dysfunction Dermatochalasis - upper lid, Telangiectasia, mild Meibomian gland dysfunction   Conjunctiva/Sclera White and quiet White and quiet   Cornea 2+ central Punctate epithelial erosions, focal SPK nasal paracentral -- ?closed/healed epi defect 1+ inferior Punctate epithelial erosions   Anterior Chamber Deep and quiet, narrow temporal angle Deep and quiet, narrow temporal angle   Iris Round and moderately dilated to 5.41mm Round and moderately dilated to 5.7mm   Lens Posterior chamber intraocular lens Posterior chamber intraocular lens   Vitreous Vitreous syneresis Vitreous syneresis       Fundus Exam      Right Left   Disc Sharp rim, Pallor, +cupping, mild, temporal Peripapillary atrophy Pink and Sharp, temporal Peripapillary atrophy   C/D Ratio 0.75 0.6   Macula Blunted foveal reflex, +CNV with interval improvement in SRF, Drusen, trace cystic changes overlying nasal PED -- improved Flat, Blunted foveal reflex, drusen, Retinal pigment epithelial mottling, No heme or edema   Vessels Vascular attenuation, Tortuous Vascular attenuation, Tortuous   Periphery Attached; no heme Attached; no heme          IMAGING AND  PROCEDURES  Imaging and Procedures for @TODAY @  OCT, Retina - OU - Both Eyes       Right Eye Quality was good. Central Foveal Thickness: 193. Progression has been stable. Findings include subretinal fluid, intraretinal fluid, intraretinal hyper-reflective material, pigment epithelial detachment, subretinal hyper-reflective material, retinal drusen , normal foveal contour, outer retinal atrophy (stable improvement in IRF/SRF overlying PED nasal macula).   Left Eye Quality was good. Central Foveal Thickness: 209. Progression has been stable. Findings include normal foveal contour, no  IRF, no SRF, retinal drusen .   Notes *Images captured and stored on drive  Diagnosis / Impression:  OD: abnormal foveal contour, +SRHM/PED w/ interval improvement in IRF/SRF overlying PED nasal macula OS: NFP, no IRF/SRF; +drusen -- nonexudative ARMD  Clinical management:  See below  Abbreviations: NFP - Normal foveal profile. CME - cystoid macular edema. PED - pigment epithelial detachment. IRF - intraretinal fluid. SRF - subretinal fluid. EZ - ellipsoid zone. ERM - epiretinal membrane. ORA - outer retinal atrophy. ORT - outer retinal tubulation. SRHM - subretinal hyper-reflective material                 ASSESSMENT/PLAN:    ICD-10-CM   1. Exudative age-related macular degeneration of right eye with active choroidal neovascularization ([place])  H35.3211   2. Retinal edema  H35.81 OCT, Retina - OU - Both Eyes  3. Intermediate stage nonexudative age-related macular degeneration of left eye  H35.3122   4. Diabetes mellitus type 2 without retinopathy ([place])  E11.9   5. Hypertensive retinopathy of both eyes  H35.033   6. Essential hypertension  I10   7. Pseudophakia of both eyes  Z96.1   8. Primary open angle glaucoma of both eyes, unspecified glaucoma stage  H40.1130    Acute f/u today due to decreased vision after injection on 03.12, OD - pt reports FBS and decreased vision OD, which has improved over the last day or so - exam shows central PEE and focal SPK -- ?closed/healed epi defect OD - discussed findings and recommend increased use of ATs - f/u as scheduled  1,2. 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, OD    - delayed follow up from 4 weeks to 8 weeks due to recent passing of husband (12.11.20-02.12.21  - s/p IVA OD #1 (12.11.20), #2 (02.12.21), #3 (03.12.21)  - FA 12.11.20 confirms +CNVM  - OCT today shows PED/CNVM w/ interval improvement in SRF and IRF overlying nasal PED  - BCVA OD decreased to 20/100 from 20/30 -- due to cornea  - Avastin informed consent form  signed and scanned on 12.11.2020 (OD)  - f/u in 4 wks -- DFE/OCT/possible injection  3. Age related macular degeneration, non-exudative, OS  - intermediate stage  - The incidence, anatomy, and pathology of dry AMD, risk of progression, and the AREDS and AREDS 2 study including smoking risks discussed with patient.  - Recommend amsler grid monitoring  4. Diabetes mellitus, type 2 without retinopathy  - The incidence, risk factors for progression, natural history and treatment options for diabetic retinopathy  were discussed with patient.    - The need for close monitoring of blood glucose, blood pressure, and serum lipids, avoiding cigarette or any type of tobacco, and the need for long term follow up was also discussed with patient.  - monitor  5,6. Hypertensive retinopathy OU  - discussed importance of tight BP control  - monitor  7. Pseudophakia OU  - s/p CE/IOL (Dr. Venetia Maxon)  - beautiful surgery, doing  well  - monitor  8. POAG OU  - formerly managed by Dr. Venetia Maxon  - s/p laser w/ Dr. Venetia Maxon -- ?SLT  - IOP 15 OU  - currently on latanoprost QHS  - monitor   Ophthalmic Meds Ordered this visit:  No orders of the defined types were placed in this encounter.      Return for as scheduled.  There are no Patient Instructions on file for this visit.   Explained the diagnoses, plan, and follow up with the patient and they expressed understanding.  Patient expressed understanding of the importance of proper follow up care.   This document serves as a record of services personally performed by Gardiner Sleeper, MD, PhD. It was created on their behalf by Ernest Mallick, OA, an ophthalmic assistant. The creation of this record is the provider's dictation and/or activities during the visit.    Electronically signed by: Ernest Mallick, OA 03.16.2021 1:48 PM   Gardiner Sleeper, M.D., Ph.D. Diseases & Surgery of the Retina and Vitreous [facility]  I have  reviewed the above documentation for accuracy and completeness, and I agree with the above. Gardiner Sleeper, M.D., Ph.D. 09/18/19 1:48 PM   Abbreviations: M myopia (nearsighted); A astigmatism; H hyperopia (farsighted); P presbyopia; Mrx spectacle prescription;  CTL contact lenses; OD right eye; OS left eye; OU both eyes  XT exotropia; ET esotropia; PEK punctate epithelial keratitis; PEE punctate epithelial erosions; DES dry eye syndrome; MGD meibomian gland dysfunction; ATs artificial tears; PFAT's preservative free artificial tears; [place] nuclear sclerotic cataract; PSC posterior subcapsular cataract; ERM epi-retinal membrane; PVD posterior vitreous detachment; RD retinal detachment; DM diabetes mellitus; DR diabetic retinopathy; NPDR non-proliferative diabetic retinopathy; PDR proliferative diabetic retinopathy; CSME clinically significant macular edema; DME diabetic macular edema; dbh dot blot hemorrhages; CWS cotton wool spot; POAG primary open angle glaucoma; C/D cup-to-disc ratio; HVF humphrey visual field; GVF goldmann visual field; OCT optical coherence tomography; IOP intraocular pressure; BRVO Branch retinal vein occlusion; CRVO central retinal vein occlusion; CRAO central retinal artery occlusion; BRAO branch retinal artery occlusion; RT retinal tear; SB scleral buckle; PPV pars plana vitrectomy; VH Vitreous hemorrhage; PRP panretinal laser photocoagulation; IVK intravitreal kenalog; VMT vitreomacular traction; MH Macular hole;  NVD neovascularization of the disc; NVE neovascularization elsewhere; AREDS age related eye disease study; ARMD age related macular degeneration; POAG primary open angle glaucoma; EBMD epithelial/anterior basement membrane dystrophy; ACIOL anterior chamber intraocular lens; IOL intraocular lens; PCIOL posterior chamber intraocular lens; Phaco/IOL phacoemulsification with intraocular lens placement; [place] photorefractive keratectomy; LASIK laser assisted in situ keratomileusis;  HTN hypertension; DM diabetes mellitus; COPD chronic obstructive pulmonary disease

## 2019-09-26 ENCOUNTER — Other Ambulatory Visit: Payer: Self-pay | Admitting: Cardiovascular Disease

## 2019-09-28 ENCOUNTER — Ambulatory Visit: Payer: Medicare Other | Admitting: Cardiovascular Disease

## 2019-09-28 ENCOUNTER — Encounter: Payer: Self-pay | Admitting: Cardiovascular Disease

## 2019-09-28 ENCOUNTER — Other Ambulatory Visit: Payer: Self-pay

## 2019-09-28 VITALS — BP 136/64 | HR 75 | Ht 62.0 in | Wt 167.0 lb

## 2019-09-28 DIAGNOSIS — E7800 Pure hypercholesterolemia, unspecified: Secondary | ICD-10-CM

## 2019-09-28 DIAGNOSIS — R072 Precordial pain: Secondary | ICD-10-CM | POA: Diagnosis not present

## 2019-09-28 DIAGNOSIS — I5032 Chronic diastolic (congestive) heart failure: Secondary | ICD-10-CM

## 2019-09-28 DIAGNOSIS — I10 Essential (primary) hypertension: Secondary | ICD-10-CM | POA: Diagnosis not present

## 2019-09-28 DIAGNOSIS — Z5181 Encounter for therapeutic drug level monitoring: Secondary | ICD-10-CM | POA: Diagnosis not present

## 2019-09-28 MED ORDER — LOSARTAN POTASSIUM 25 MG PO TABS: 25.0000 mg | ORAL_TABLET | Freq: Every day | ORAL | 3 refills | Status: DC

## 2019-09-28 MED ORDER — LOSARTAN POTASSIUM 50 MG PO TABS: 50.0000 mg | ORAL_TABLET | Freq: Every day | ORAL | 3 refills | Status: DC

## 2019-09-28 NOTE — Patient Instructions (Addendum)
Medication Instructions:  START LOSARTAN 25 MG DAILY DOUBLE CHECK YOUR MEDICATIONS IF YOU HAVE LOSARTAN AT HOME AND ARE TAKING 1/2 50 MG DAILY INCREASE TO FULL TABLET. PLEASE CALL THE OFFICE AND LET us KNOW IT YOU DO  *If you need a refill on your cardiac medications before your next appointment, please call your pharmacy*  Lab Work: FASTING LP/CMET [place]   If you have labs (blood work) drawn today and your tests are completely normal, you will receive your results only by: [name] MyChart Message (if you have MyChart) OR . A paper copy in the mail If you have any lab test that is abnormal or we need to change your treatment, we will call you to review the results.  Testing/Procedures: Your physician has requested that you have a lexiscan myoview. For further information please visit [email or web address]. Please follow instruction sheet, as given.  Follow-Up: At [facility], you and your health needs are our priority.  As part of our continuing mission to provide you with exceptional heart care, we have created designated Provider Care Teams.  These Care Teams include your primary Cardiologist (physician) and Advanced Practice Providers (APPs -  Physician Assistants and Nurse Practitioners) who all work together to provide you with the care you need, when you need it.  We recommend signing up for the patient portal called "MyChart".  Sign up information is provided on this After Visit Summary.  MyChart is used to connect with patients for Virtual Visits (Telemedicine).  Patients are able to view lab/test results, encounter notes, upcoming appointments, etc.  Non-urgent messages can be sent to your provider as well.   To learn more about what you can do with MyChart, go to [email or web address].    Your next appointment:   6 month(s)You will receive a reminder letter in the mail two months in advance. If you don't receive a letter, please call our office to schedule the  follow-up appointment.  The format for your next appointment:   In Person  Provider:   You may see DR [facility]  or one of the following Advanced Practice Providers on your designated Care Team:    Kerin Ransom, PA-C  [place], [place]  Coletta Memos, [place]  Other Instructions   Cardiac Nuclear Scan A cardiac nuclear scan is a test that is done to check the flow of blood to your heart. It is done when you are resting and when you are exercising. The test looks for problems such as:  Not enough blood reaching a portion of the heart.  The heart muscle not working as it should. You may need this test if:  You have heart disease.  You have had lab results that are not normal.  You have had heart surgery or a balloon procedure to open up blocked arteries (angioplasty).  You have chest pain.  You have shortness of breath. In this test, a special dye (tracer) is put into your bloodstream. The tracer will travel to your heart. A camera will then take pictures of your heart to see how the tracer moves through your heart. This test is usually done at a hospital and takes 2-4 hours. Tell a doctor about:  Any allergies you have.  All medicines you are taking, including vitamins, herbs, eye drops, creams, and over-the-counter medicines.  Any problems you or family members have had with anesthetic medicines.  Any blood disorders you have.  Any surgeries you have had.  Any medical conditions you have.  Whether you are pregnant or may be pregnant. What are the risks? Generally, this is a safe test. However, problems may occur, such as:  Serious chest pain and heart attack. This is only a risk if the stress portion of the test is done.  Rapid heartbeat.  A feeling of warmth in your chest. This feeling usually does not last long.  Allergic reaction to the tracer. What happens before the test?  Ask your doctor about changing or stopping your normal medicines. This is  important.  Follow instructions from your doctor about what you cannot eat or drink.  Remove your jewelry on the day of the test. What happens during the test?  An IV tube will be inserted into one of your veins.  Your doctor will give you a small amount of tracer through the IV tube.  You will wait for 20-40 minutes while the tracer moves through your bloodstream.  Your heart will be monitored with an electrocardiogram (ECG).  You will lie down on an exam table.  Pictures of your heart will be taken for about 15-20 minutes.  You may also have a stress test. For this test, one of these things may be done: ? You will be asked to exercise on a treadmill or a stationary bike. ? You will be given medicines that will make your heart work harder. This is done if you are unable to exercise.  When blood flow to your heart has peaked, a tracer will again be given through the IV tube.  After 20-40 minutes, you will get back on the exam table. More pictures will be taken of your heart.  Depending on the tracer that is used, more pictures may need to be taken 3-4 hours later.  Your IV tube will be removed when the test is over. The test may vary among doctors and hospitals. What happens after the test?  Ask your doctor: ? Whether you can return to your normal schedule, including diet, activities, and medicines. ? Whether you should drink more fluids. This will help to remove the tracer from your body. Drink enough fluid to keep your pee (urine) pale yellow.  Ask your doctor, or the department that is doing the test: ? When will my results be ready? ? How will I get my results? Summary  A cardiac nuclear scan is a test that is done to check the flow of blood to your heart.  Tell your doctor whether you are pregnant or may be pregnant.  Before the test, ask your doctor about changing or stopping your normal medicines. This is important.  Ask your doctor whether you can return to  your normal activities. You may be asked to drink more fluids. This information is not intended to replace advice given to you by your health care provider. Make sure you discuss any questions you have with your health care provider. Document Revised: 10/11/2018 Document Reviewed: 12/05/2017 Elsevier Patient Education  [place].

## 2019-09-28 NOTE — Progress Notes (Signed)
Cardiology Office Note   Date:  09/29/2019   ID:  [name], DOB [birth year], MRN [id number]  PCP:  Darlene Sites, MD  Cardiologist:   Skeet Latch, MD   No chief complaint on file.    History of Present Illness: [name] is a 79 y.o. female with CAD (50% LAD), chronic diastolic heart failure (grade 2), hypertension, hyperlipidemia, prior DVT on warfarin, and diabetes type 2 who presents for follow up.  She was first seen 04/2015 at which time she reported occasional chest pain.  She had an exercise Myoview 04/2015 that showed LVEF 78% with a small defect of moderate severity in the mid anterior and apical anterior region.  This was felt to be due to breast attenuation artifact.  However, she underwent cardiac catheterization on 05/26/15 that revealed a 50% LAD lesion. There was concern that there may be a component of vasospasm so long-acting nitrates were started. Her blood pressure and hyperlipidemia medications have been titrated due to poor control.  She is also on Lasix due to lower extremity edema.  Since her last appointment [name]' husband died of COVID double pneumonia.  This occurred 07/2019.  He was treated with Remdesivir at [facility] outpatient infusion center.  Since then she has been very stressed.  She has struggled finanically and emotionally since that time.  She has experienced some chest pain that is usually on the right but also on the left.  It seems to occur when she lays down.  She thinks it is related to gas.  There is no associated shortness of breath.  She hasn't been getting any exercise.  She has mild edema that is better with lasix.  She notices that her legs swell at the end of the day and are better usually by the morning.  She has no orthopnea or PND.     Past Medical History:  Diagnosis Date  . Antral gastritis    EGD 11/15  . Asthmatic bronchitis   . Back pain   . Chronic diastolic heart failure (HCC) 05/20/2015   Grade 2 diastolic  dysfunction.  04/2015.  [name] Chronic kidney disease    kidney function low  . Diabetes mellitus    x 5 yrs  . DVT of axillary vein, acute left ([place]) 07/24/12  . GERD (gastroesophageal reflux disease)   . Glaucoma    POAG OU  . Hyperlipidemia 05/20/2015  . Hypertension   . Hypertensive retinopathy    OU  . Hypothyroidism   . Kidney stones   . Macular degeneration    Wet OD, Dry OS  . Mixed hyperlipidemia   . Peripheral venous insufficiency   . Pinched nerve    right elbow  . Sigmoid diverticulitis   . Vertigo    chonic    Past Surgical History:  Procedure Laterality Date  . ABDOMINAL HYSTERECTOMY    . BACK SURGERY     spinal   . CARDIAC CATHETERIZATION N/A 05/26/2015   Procedure: Left Heart Cath and Coronary Angiography;  Surgeon: Jettie Booze, MD;  Location: [place] [facility];  Service: Cardiovascular;  Laterality: N/A;  . CATARACT EXTRACTION Bilateral   . CHOLECYSTECTOMY    . COLON SURGERY    . COLONOSCOPY N/A 12/27/2013   Procedure: COLONOSCOPY;  Surgeon: Rogene Houston, MD;  Location: [facility];  Service: Endoscopy;  Laterality: N/A;  200  . COLOSTOMY CLOSURE    . ESOPHAGOGASTRODUODENOSCOPY N/A 05/07/2014   Procedure: ESOPHAGOGASTRODUODENOSCOPY (EGD);  Surgeon: Rogene Houston, MD;  Location: [facility];  Service: Endoscopy;  Laterality: N/A;  . EYE SURGERY Bilateral    Cat Sx  . fracture left foot    . HERNIA REPAIR    . NM MYOCAR PERF WALL MOTION  01/28/2009   Normal  . OTHER SURGICAL HISTORY     colostomy, colostomy reversal, for diverticulitis surgical hernia repair, arm surgery, neck surgery  . US ECHOCARDIOGRAPHY  02/11/2010   Mild MR,trace TR & AI     Current Outpatient Medications  Medication Sig Dispense Refill  . albuterol (PROVENTIL HFA;VENTOLIN HFA) 108 (90 BASE) MCG/ACT inhaler Inhale 2 puffs into the lungs every 4 (four) hours as needed for shortness of breath. 1 Inhaler 0  . allopurinol (ZYLOPRIM) 100 MG tablet Take 100 mg by mouth  daily as needed.     [name] amitriptyline (ELAVIL) 25 MG tablet Take 50 mg by mouth at bedtime.     . Black Cohosh 40 MG CAPS Take 40 mg by mouth 2 (two) times daily.    . carvedilol (COREG) 6.25 MG tablet Take 1 tablet (6.25 mg total) by mouth 2 (two) times daily. Please keep upcoming appt for refills. Thanks! 180 tablet 0  . cephALEXin (KEFLEX) 500 MG capsule Take 500 mg by mouth 4 (four) times daily.    . cetirizine (ZYRTEC) 10 MG tablet Take 10 mg by mouth daily.    . Cholecalciferol (VITAMIN D-3) 1000 units CAPS Take 1 capsule by mouth daily.    [name] esomeprazole (NEXIUM) 20 MG capsule Take 20 mg by mouth daily at 12 noon.    . Evolocumab (REPATHA SURECLICK) [id number] MG/ML SOAJ Inject 140 mg into the skin every 14 (fourteen) days. 6 pen 3  . FLUBLOK QUADRIVALENT 0.5 ML injection     . furosemide (LASIX) 40 MG tablet TAKE 1 TABLET (40 MG TOTAL) BY MOUTH DAILY. MAY TAKE EXTRA DAILY AS NEEDED FOR SWELLING 180 tablet 1  . gabapentin (NEURONTIN) 300 MG capsule Take 300 mg by mouth 3 (three) times daily.    [name] glimepiride (AMARYL) 2 MG tablet Take 2 mg by mouth daily.  2  . latanoprost (XALATAN) 0.005 % ophthalmic solution Place 1 drop into both eyes at bedtime.     [name] losartan (COZAAR) 25 MG tablet Take 1 tablet (25 mg total) by mouth daily. 90 tablet 3  . meclizine (ANTIVERT) 25 MG tablet Take 25 mg by mouth as needed for dizziness.     . Omega-3 Fatty Acids (FISH OIL) 1200 MG CAPS Take 1 capsule by mouth 2 (two) times daily.    . polyethylene glycol (MIRALAX / GLYCOLAX) packet Take 17 g by mouth daily.    . pravastatin (PRAVACHOL) 20 MG tablet Take by mouth.    . sitaGLIPtin (JANUVIA) 50 MG tablet Take 1 tablet by mouth daily.    [name] tetrahydrozoline 0.05 % ophthalmic solution Place 1 drop into both eyes at bedtime.    . vitamin B-12 (CYANOCOBALAMIN) 100 MCG tablet Take 1 tablet by mouth daily.    [name] warfarin (COUMADIN) 1 MG tablet Take 1 mg by mouth daily.  11  . isosorbide mononitrate (IMDUR) 30 MG 24 hr  tablet TAKE 1 TABLET BY MOUTH  DAILY 90 tablet 3  . potassium chloride SA (KLOR-CON) 20 MEQ tablet TAKE ONE-HALF TABLET BY  MOUTH DAILY 45 tablet 3   No current facility-administered medications for this visit.    Allergies:   Vioxx [rofecoxib], Metformin and related, Penicillins, Pravastatin, Codeine,  Motrin [ibuprofen], and Sulfur    Social History:  The patient  reports that she quit smoking about 5 years ago. Her smoking use included cigarettes. She has never used smokeless tobacco. She reports that she does not drink alcohol or use drugs.   Family History:  The patient's family history includes CVA (age of onset: 61) in her maternal grandmother; Cancer in her mother; Cancer (age of onset: 77) in her sister; Glaucoma in her maternal uncle; Heart attack (age of onset: 22) in her brother; Heart disease in her maternal grandmother; Other (age of onset: 56) in her mother; Stroke in her maternal grandmother.    ROS:  Please see the history of present illness.   Otherwise, review of systems are positive for hiatal hernia, GERD    All other systems are reviewed and negative.    PHYSICAL EXAM: VS:  BP 136/64   Pulse 75   Ht 5\' 2"  (1.575 m)   Wt 167 lb (75.8 kg)   SpO2 92%   BMI 30.54 kg/m  , BMI Body mass index is 30.54 kg/m. GENERAL:  Well appearing HEENT: Pupils equal round and reactive, fundi not visualized, oral mucosa unremarkable NECK:  No jugular venous distention, waveform within normal limits, carotid upstroke brisk and symmetric, no bruit LUNGS:  Clear to auscultation bilaterally HEART:  RRR.  PMI not displaced or sustained,S1 and S2 within normal limits, no S3, no S4, no clicks, no rubs, no murmurs ABD:  Flat, positive bowel sounds normal in frequency in pitch, no bruits, no rebound, no guarding, no midline pulsatile mass, no hepatomegaly, no splenomegaly EXT:  2 plus pulses throughout, trace LE edema, no cyanosis no clubbing SKIN:  No rashes no nodules NEURO:  Cranial nerves  II through XII grossly intact, motor grossly intact throughout PSYCH:  Cognitively intact, oriented to person place and time   EKG:  EKG is ordered today. 02/16/16: Sinus rhythm rate 83 bpm.  PAC.  RBBB.  LPFB. 03/02/17: Sinus rhythm.  RBBB.  LPFB 09/15/17: Sinus rhythm.  Rate 64 bpm. RBBB  09/28/2019: Sinus rhythm.  Rate 75 bpm.  RBBB.  Exercise Myoview 04/30/15  The left ventricular ejection fraction is hyperdynamic (>65%).  Nuclear stress EF: 78%.  Blood pressure demonstrated a hypertensive response to exercise.  Upsloping ST segment depression ST segment depression was noted during stress in the III, II and aVF leads. This is not diagnostic for ischemia.  Defect 1: There is a small defect of moderate severity present in the mid anterior and apical anterior location. Wall motion is normal, so this is likely artifact due to breast attenuation. However, cannot rule out LAD ischemia.  This is a low risk study.  Cardiac cath 05/26/15:  Mid LAD lesion, 50% stenosed. FFR of this lesion showed value of 0.85. This is felt to be nonsignificant.  Normal LVEDP.  Continue aggressive medical therapy. Would consider adding long-acting nitrate as the patient may have some component of vasospasm. Of note, in the future , if repeat catheterization was needed , would consider using a 5 [place] guide catheter as her left main is very short and the catheter tends to deep seat Into either the LAD or the circumflex.  Recent Labs: No results found for requested labs within last 8760 hours.   03/12/15: INR 2.9   Lipid Panel    Component Value Date/Time   CHOL 242 (H) 03/08/2017 0953   TRIG 201 (H) 03/08/2017 0953   HDL 43 03/08/2017 0953   CHOLHDL 5.6 (  H) 03/08/2017 0953   CHOLHDL 4.9 08/17/2016 0914   VLDL 33 (H) 08/17/2016 0914   LDLCALC 159 (H) 03/08/2017 0953      Wt Readings from Last 3 Encounters:  09/28/19 167 lb (75.8 kg)  11/14/17 176 lb 3.2 oz (79.9 kg)  09/15/17 178 lb 6.4 oz  (80.9 kg)      ASSESSMENT AND PLAN:  # Non-obstructive CAD:  # Hyperlipidemia: # Chest pain: She is having atypical chest pain. However she has known 50% LAD obstruction.  FFR was within normal limits.  We will get a Lexiscan Myoview to evaluate for ischemia.  Continue carvedilol, Repatha, Imdur and pravastatin.  # Chronic diastolic heart failure: Stable.  Continue lasix, carvedilol, and losaratn.   # Hypertension: BP was initially elevated but better on repeat.  Continue carvedilol, lasix, and losartan.  # DVT:  Continue warfarin.  Current medicines are reviewed at length with the patient today.  The patient does not have concerns regarding medicines.  The following changes have been made:  no change  Labs/ tests ordered today include:   Orders Placed This Encounter  Procedures  . Lipid panel  . Comprehensive metabolic panel  . MYOCARDIAL PERFUSION IMAGING  . EKG 12-Lead     Disposition:   FU with Darlene Fergeson C. Oval Linsey, MD in 6 months.     Signed, Skeet Latch, MD  09/29/2019 12:45 PM    [facility]

## 2019-09-29 ENCOUNTER — Encounter: Payer: Self-pay | Admitting: Cardiovascular Disease

## 2019-10-03 DIAGNOSIS — E1165 Type 2 diabetes mellitus with hyperglycemia: Secondary | ICD-10-CM | POA: Diagnosis not present

## 2019-10-03 DIAGNOSIS — I10 Essential (primary) hypertension: Secondary | ICD-10-CM | POA: Diagnosis not present

## 2019-10-03 DIAGNOSIS — I2510 Atherosclerotic heart disease of native coronary artery without angina pectoris: Secondary | ICD-10-CM | POA: Diagnosis not present

## 2019-10-03 DIAGNOSIS — H353211 Exudative age-related macular degeneration, right eye, with active choroidal neovascularization: Secondary | ICD-10-CM | POA: Diagnosis not present

## 2019-10-04 ENCOUNTER — Telehealth (HOSPITAL_COMMUNITY): Payer: Self-pay

## 2019-10-04 NOTE — Telephone Encounter (Signed)
Encounter complete. 

## 2019-10-05 DIAGNOSIS — Z5181 Encounter for therapeutic drug level monitoring: Secondary | ICD-10-CM | POA: Diagnosis not present

## 2019-10-05 DIAGNOSIS — R072 Precordial pain: Secondary | ICD-10-CM | POA: Diagnosis not present

## 2019-10-05 DIAGNOSIS — E7800 Pure hypercholesterolemia, unspecified: Secondary | ICD-10-CM | POA: Diagnosis not present

## 2019-10-06 LAB — COMPREHENSIVE METABOLIC PANEL
ALT: 26 IU/L (ref 0–32)
AST: 20 IU/L (ref 0–40)
Albumin/Globulin Ratio: 1.7 (ref 1.2–2.2)
Albumin: 4.3 g/dL (ref 3.7–4.7)
Alkaline Phosphatase: 139 IU/L — ABNORMAL HIGH (ref 39–117)
BUN/Creatinine Ratio: 18 (ref 12–28)
BUN: 24 mg/dL (ref 8–27)
Bilirubin Total: 0.4 mg/dL (ref 0.0–1.2)
CO2: 23 mmol/L (ref 20–29)
Calcium: 10 mg/dL (ref 8.7–10.3)
Chloride: 102 mmol/L (ref 96–106)
Creatinine, Ser: 1.3 mg/dL — ABNORMAL HIGH (ref 0.57–1.00)
GFR calc Af Amer: 45 mL/min/{1.73_m2} — ABNORMAL LOW (ref >59)
GFR calc non Af Amer: 39 mL/min/{1.73_m2} — ABNORMAL LOW (ref >59)
Globulin, Total: 2.6 g/dL (ref 1.5–4.5)
Glucose: 149 mg/dL — ABNORMAL HIGH (ref 65–99)
Potassium: 4.2 mmol/L (ref 3.5–5.2)
Sodium: 141 mmol/L (ref 134–144)
Total Protein: 6.9 g/dL (ref 6.0–8.5)

## 2019-10-06 LAB — LIPID PANEL
Chol/HDL Ratio: 5.2 ratio — ABNORMAL HIGH (ref 0.0–4.4)
Cholesterol, Total: 225 mg/dL — ABNORMAL HIGH (ref 100–199)
HDL: 43 mg/dL (ref >39)
LDL Chol Calc (NIH): 148 mg/dL — ABNORMAL HIGH (ref 0–99)
Triglycerides: 189 mg/dL — ABNORMAL HIGH (ref 0–149)
VLDL Cholesterol Cal: 34 mg/dL (ref 5–40)

## 2019-10-09 ENCOUNTER — Other Ambulatory Visit: Payer: Self-pay

## 2019-10-09 ENCOUNTER — Ambulatory Visit (HOSPITAL_COMMUNITY)
Admission: RE | Admit: 2019-10-09 | Discharge: 2019-10-09 | Disposition: A | Discharge status: Home / Self Care | Payer: Medicare Other | Source: Ambulatory Visit | Attending: Cardiology | Admitting: Cardiology

## 2019-10-09 DIAGNOSIS — R072 Precordial pain: Secondary | ICD-10-CM | POA: Diagnosis not present

## 2019-10-09 LAB — MYOCARDIAL PERFUSION IMAGING
LV dias vol: 61 mL (ref 46–106)
LV sys vol: 16 mL
Peak HR: 95 {beats}/min
Rest HR: 67 {beats}/min
SRS: 1
SSS: 1
TID: 1.02

## 2019-10-09 MED ORDER — TECHNETIUM TC 99M TETROFOSMIN IV KIT
30.7000 | PACK | Freq: Once | INTRAVENOUS | Status: AC | PRN
  Administered 2019-10-09: 30.7 via INTRAVENOUS
  Filled 2019-10-09: qty 31

## 2019-10-09 MED ORDER — TECHNETIUM TC 99M TETROFOSMIN IV KIT
10.3000 | PACK | Freq: Once | INTRAVENOUS | Status: AC | PRN
  Administered 2019-10-09: 10.3 via INTRAVENOUS
  Filled 2019-10-09: qty 11

## 2019-10-09 MED ORDER — REGADENOSON 0.4 MG/5ML IV SOLN
0.4000 mg | Freq: Once | INTRAVENOUS | Status: AC
  Administered 2019-10-09: 0.4 mg via INTRAVENOUS

## 2019-10-09 NOTE — Progress Notes (Signed)
[facility] Note  10/12/2019     CHIEF COMPLAINT Patient presents for Retina Follow Up   HISTORY OF PRESENT ILLNESS: [name] is a 79 y.o. female who presents to the clinic today for:   HPI    Retina Follow Up    Patient presents with  Wet AMD.  In right eye.  This started weeks ago.  Severity is moderate.  Duration of weeks.  Since onset it is gradually improving.  I, the attending physician,  performed the HPI with the patient and updated documentation appropriately.          Comments    Pt states her vision is getting better in her right eye.  Pt denies eye pain or discomfort and denies any new or worsening floaters or fol OU.       Last edited by Bernarda Caffey, MD on 10/12/2019  4:13 PM. (History)    Patient states her vision cleared up from her last visit, and she has no more pain, pt states she has been told to stop taking her Losartan because it has damaged her kidneys, she states they have given her a new medication to take   Referring physician: Sharilyn Sites, MD [address] [place],  [place] [address]  HISTORICAL INFORMATION:   Selected notes from the MEDICAL RECORD NUMBER Referred by Dr. Madelin Headings for concern of SRF OD LEE: 11.27.20 (M. Cotter) [BCVA: OD: 20/80-- OS: 20/60-]  Ocular Hx-glaucoma (latanoprost)  PMH-DM    CURRENT MEDICATIONS: Current Outpatient Medications (Ophthalmic Drugs)  Medication Sig  . latanoprost (XALATAN) 0.005 % ophthalmic solution Place 1 drop into both eyes at bedtime.   [name] tetrahydrozoline 0.05 % ophthalmic solution Place 1 drop into both eyes at bedtime.   No current facility-administered medications for this visit. (Ophthalmic Drugs)   Current Outpatient Medications (Other)  Medication Sig  . albuterol (PROVENTIL HFA;VENTOLIN HFA) 108 (90 BASE) MCG/ACT inhaler Inhale 2 puffs into the lungs every 4 (four) hours as needed for shortness of breath.  . allopurinol (ZYLOPRIM) 100 MG tablet Take  100 mg by mouth daily as needed.   [name] amitriptyline (ELAVIL) 25 MG tablet Take 50 mg by mouth at bedtime.   . Black Cohosh 40 MG CAPS Take 40 mg by mouth 2 (two) times daily.  . carvedilol (COREG) 6.25 MG tablet Take 1 tablet (6.25 mg total) by mouth 2 (two) times daily. Please keep upcoming appt for refills. Thanks!  . cephALEXin (KEFLEX) 500 MG capsule Take 500 mg by mouth 4 (four) times daily.  . cetirizine (ZYRTEC) 10 MG tablet Take 10 mg by mouth daily.  . Cholecalciferol (VITAMIN D-3) 1000 units CAPS Take 1 capsule by mouth daily.  [name] esomeprazole (NEXIUM) 20 MG capsule Take 20 mg by mouth daily at 12 noon.  . Evolocumab (REPATHA SURECLICK) [id number] MG/ML SOAJ Inject 140 mg into the skin every 14 (fourteen) days.  [name] FLUBLOK QUADRIVALENT 0.5 ML injection   . furosemide (LASIX) 40 MG tablet TAKE 1 TABLET (40 MG TOTAL) BY MOUTH DAILY. MAY TAKE EXTRA DAILY AS NEEDED FOR SWELLING  . gabapentin (NEURONTIN) 300 MG capsule Take 300 mg by mouth 3 (three) times daily.  [name] glimepiride (AMARYL) 2 MG tablet Take 2 mg by mouth daily.  . hydrALAZINE (APRESOLINE) 25 MG tablet Take 1 tablet (25 mg total) by mouth in the morning and at bedtime.  . isosorbide mononitrate (IMDUR) 30 MG 24 hr tablet TAKE 1 TABLET BY MOUTH  DAILY  . meclizine (ANTIVERT) 25 MG tablet Take 25 mg by mouth as needed for dizziness.   . Omega-3 Fatty Acids (FISH OIL) 1200 MG CAPS Take 1 capsule by mouth 2 (two) times daily.  . polyethylene glycol (MIRALAX / GLYCOLAX) packet Take 17 g by mouth daily.  . potassium chloride SA (KLOR-CON) 20 MEQ tablet TAKE ONE-HALF TABLET BY  MOUTH DAILY  . sitaGLIPtin (JANUVIA) 50 MG tablet Take 1 tablet by mouth daily.  . vitamin B-12 (CYANOCOBALAMIN) 100 MCG tablet Take 1 tablet by mouth daily.  [name] warfarin (COUMADIN) 1 MG tablet Take 1 mg by mouth daily.   No current facility-administered medications for this visit. (Other)      REVIEW OF SYSTEMS: ROS    Positive for: Gastrointestinal,  Genitourinary, Endocrine, Cardiovascular, Eyes   Negative for: Constitutional, Neurological, Skin, Musculoskeletal, HENT, Respiratory, Psychiatric, Allergic/Imm, Heme/Lymph   Last edited by Doneen Poisson on 10/12/2019  1:49 PM. (History)       ALLERGIES Allergies  Allergen Reactions  . Vioxx [Rofecoxib] Shortness Of Breath  . Metformin And Related     Kidney failure  . Penicillins     rash  . Pravastatin   . Codeine Rash  . Motrin [Ibuprofen] Rash  . Sulfur Rash    PAST MEDICAL HISTORY Past Medical History:  Diagnosis Date  . Antral gastritis    EGD 11/15  . Asthmatic bronchitis   . Back pain   . Chronic diastolic heart failure (HCC) 05/20/2015   Grade 2 diastolic dysfunction.  04/2015.  [name] Chronic kidney disease    kidney function low  . Diabetes mellitus    x 5 yrs  . DVT of axillary vein, acute left ([place]) 07/24/12  . GERD (gastroesophageal reflux disease)   . Glaucoma    POAG OU  . Hyperlipidemia 05/20/2015  . Hypertension   . Hypertensive retinopathy    OU  . Hypothyroidism   . Kidney stones   . Macular degeneration    Wet OD, Dry OS  . Mixed hyperlipidemia   . Peripheral venous insufficiency   . Pinched nerve    right elbow  . Sigmoid diverticulitis   . Vertigo    chonic   Past Surgical History:  Procedure Laterality Date  . ABDOMINAL HYSTERECTOMY    . BACK SURGERY     spinal   . CARDIAC CATHETERIZATION N/A 05/26/2015   Procedure: Left Heart Cath and Coronary Angiography;  Surgeon: Jettie Booze, MD;  Location: [facility];  Service: Cardiovascular;  Laterality: N/A;  . CATARACT EXTRACTION Bilateral   . CHOLECYSTECTOMY    . COLON SURGERY    . COLONOSCOPY N/A 12/27/2013   Procedure: COLONOSCOPY;  Surgeon: Rogene Houston, MD;  [facility];  Service: Endoscopy;  Laterality: N/A;  200  . COLOSTOMY CLOSURE    . ESOPHAGOGASTRODUODENOSCOPY N/A 05/07/2014   Procedure: ESOPHAGOGASTRODUODENOSCOPY (EGD);  Surgeon: Rogene Houston,  MD;  [facility]: [facility];  Service: Endoscopy;  Laterality: N/A;  . EYE SURGERY Bilateral    Cat Sx  . fracture left foot    . HERNIA REPAIR    . NM MYOCAR PERF WALL MOTION  01/28/2009   Normal  . OTHER SURGICAL HISTORY     colostomy, colostomy reversal, for diverticulitis surgical hernia repair, arm surgery, neck surgery  . US ECHOCARDIOGRAPHY  02/11/2010   Mild MR,trace TR & AI    FAMILY HISTORY Family History  Problem Relation Age of Onset  .  Other Mother 6       Cause unknown  . Cancer Mother        liver  . CVA Maternal Grandmother 90       deceased  . Heart disease Maternal Grandmother   . Stroke Maternal Grandmother   . Heart attack Brother 4       deceased  . Cancer Sister 20       deceased  . Glaucoma Maternal Uncle     SOCIAL HISTORY Social History   Tobacco Use  . Smoking status: Former Smoker    Types: Cigarettes    Quit date: 12/10/2013    Years since quitting: 5.8  . Smokeless tobacco: Never Used  . Tobacco comment: Smoke 1-1 1/2 packs a day  Substance Use Topics  . Alcohol use: No  . Drug use: No         OPHTHALMIC EXAM:  Base Eye Exam    Visual Acuity (Snellen - Linear)      Right Left   Dist [place] 20/30 -2 20/30 +1   Dist ph [place] 20/30 20/25 -2       Tonometry (Tonopen, 1:52 PM)      Right Left   Pressure 19 15       Pupils      Dark Light Shape React APD   Right 3 2 Round Brisk 0   Left 3 2 Round Brisk 0       Visual Fields      Left Right    Full Full       Extraocular Movement      Right Left    Full Full       Neuro/Psych    Oriented x3: Yes   Mood/Affect: Normal       Dilation    Both eyes: 1.0% Mydriacyl, 2.5% Phenylephrine @ 1:52 PM        Slit Lamp and Fundus Exam    Slit Lamp Exam      Right Left   Lids/Lashes Dermatochalasis - upper lid, Meibomian gland dysfunction Dermatochalasis - upper lid, Telangiectasia, mild Meibomian gland dysfunction   Conjunctiva/Sclera White and quiet White and quiet    Cornea Trace Punctate epithelial erosions, no epi defect 1+ inferior Punctate epithelial erosions   Anterior Chamber Deep and quiet, narrow temporal angle Deep and quiet, narrow temporal angle   Iris Round and moderately dilated to 5.17mm Round and moderately dilated to 5.22mm   Lens Posterior chamber intraocular lens Posterior chamber intraocular lens   Vitreous Vitreous syneresis Vitreous syneresis       Fundus Exam      Right Left   Disc Sharp rim, Pallor, +cupping, mild, temporal Peripapillary atrophy Pink and Sharp, temporal Peripapillary atrophy   C/D Ratio 0.75 0.6   Macula Blunted foveal reflex, +focal CNV/PED nasal macula with stable improvement in SRF/IRF, Drusen Flat, Blunted foveal reflex, drusen, Retinal pigment epithelial mottling, No heme or edema   Vessels Vascular attenuation, Tortuous Vascular attenuation, Tortuous   Periphery Attached; no heme Attached; no heme          IMAGING AND PROCEDURES  Imaging and Procedures for @TODAY @  OCT, Retina - OU - Both Eyes       Right Eye Quality was good. Central Foveal Thickness: 193. Progression has been stable. Findings include intraretinal hyper-reflective material, pigment epithelial detachment, subretinal hyper-reflective material, retinal drusen , normal foveal contour, outer retinal atrophy, no IRF, no SRF (stable improvement in IRF/SRF overlying PED nasal  macula -- essentially resolved).   Left Eye Quality was good. Central Foveal Thickness: 214. Progression has been stable. Findings include normal foveal contour, no IRF, no SRF, retinal drusen .   Notes *Images captured and stored on drive  Diagnosis / Impression:  OD: exudative ARMD; nasal SRHM/PED w/ stable improvement in IRF/SRF overlying-- essentially resolved OS: NFP, no IRF/SRF; +drusen -- nonexudative ARMD  Clinical management:  See below  Abbreviations: NFP - Normal foveal profile. CME - cystoid macular edema. PED - pigment epithelial detachment. IRF -  intraretinal fluid. SRF - subretinal fluid. EZ - ellipsoid zone. ERM - epiretinal membrane. ORA - outer retinal atrophy. ORT - outer retinal tubulation. SRHM - subretinal hyper-reflective material        Intravitreal Injection, Pharmacologic Agent - OD - Right Eye       Time Out 10/12/2019. 1:57 PM. Confirmed correct patient, procedure, site, and patient consented.   Anesthesia Topical anesthesia was used. Anesthetic medications included Proparacaine 0.5%, Lidocaine 2%.   Procedure Preparation included eyelid speculum, 5% betadine to ocular surface. A (32g) needle was used.   Injection:  1.25 mg Bevacizumab (AVASTIN) SOLN   NDC: [id number], Lot: [id number] , Expiration date: 01/17/2020   Route: Intravitreal, Site: Right Eye, Waste: 0 mg  Post-op Post injection exam found visual acuity of at least counting fingers. The patient tolerated the procedure well. There were no complications. The patient received written and verbal post procedure care education.                 ASSESSMENT/PLAN:    ICD-10-CM   1. Exudative age-related macular degeneration of right eye with active choroidal neovascularization (HCC)  H35.3211 Intravitreal Injection, Pharmacologic Agent - OD - Right Eye    Bevacizumab (AVASTIN) SOLN 1.25 mg  2. Retinal edema  H35.81 OCT, Retina - OU - Both Eyes  3. Intermediate stage nonexudative age-related macular degeneration of left eye  H35.3122   4. Diabetes mellitus type 2 without retinopathy ([place])  E11.9   5. Hypertensive retinopathy of both eyes  H35.033   6. Essential hypertension  I10   7. Pseudophakia of both eyes  Z96.1   8. Primary open angle glaucoma of both eyes, unspecified glaucoma stage  H40.1130    1,2. 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, OD    - delayed follow up from 4 weeks to 8 weeks due to passing of husband (12.11.20-02.12.21  - s/p IVA OD #1 (12.11.20), #2 (02.12.21), #3 (03.12.21)  - FA 12.11.20 confirms +CNVM  - OCT today  shows stable improvement in SRF and IRF overlying nasal PED -- essentially resolved  - BCVA OD back to 20/30  - recommend IVA OD #4 today, 04.09.21 -- maintenance w/ extension to 5 wks  - pt wishes to proceed with injection  - RBA of procedure discussed, questions answered  - informed consent obtained and signed  - see procedure note  - Avastin informed consent form signed and scanned on 12.11.2020 (OD)  - of note, pt presented acutely on 03.16.21 due to decreased vision after injection OD  - pt reported FBS and decreased vision OD, which has improved since last visit  - exam showed central PEE and focal SPK -- ?closed/healed epi defect OD -- resolved today  - f/u in 5 wks -- DFE/OCT/possible injection  3. Age related macular degeneration, non-exudative, OS  - intermediate stage  - The incidence, anatomy, and pathology of dry AMD, risk of progression, and the AREDS and AREDS 2 study including smoking risks  discussed with patient.  - Recommend amsler grid monitoring  4. Diabetes mellitus, type 2 without retinopathy  - The incidence, risk factors for progression, natural history and treatment options for diabetic retinopathy  were discussed with patient.    - The need for close monitoring of blood glucose, blood pressure, and serum lipids, avoiding cigarette or any type of tobacco, and the need for long term follow up was also discussed with patient.  - monitor  5,6. Hypertensive retinopathy OU  - discussed importance of tight BP control  - monitor  7. Pseudophakia OU  - s/p CE/IOL (Dr. Venetia Maxon)  - beautiful surgery, doing well  - monitor  8. POAG OU  - formerly managed by Dr. Venetia Maxon  - s/p laser w/ Dr. Venetia Maxon -- ?SLT  - IOP 19,15 OU  - currently on latanoprost QHS  - monitor   Ophthalmic Meds Ordered this visit:  Meds ordered this encounter  Medications  . Bevacizumab (AVASTIN) SOLN 1.25 mg       Return in about 5 weeks (around 11/16/2019) for f/u exu ARMD OD, DFE,  OCT.  There are no Patient Instructions on file for this visit.   Explained the diagnoses, plan, and follow up with the patient and they expressed understanding.  Patient expressed understanding of the importance of proper follow up care.   This document serves as a record of services personally performed by Gardiner Sleeper, MD, PhD. It was created on their behalf by Ernest Mallick, OA, an ophthalmic assistant. The creation of this record is the provider's dictation and/or activities during the visit.    Electronically signed by: Ernest Mallick, OA 04.06.2021 4:19 PM   Gardiner Sleeper, M.D., Ph.D. Diseases & Surgery of the Retina and Vitreous [facility]  I have reviewed the above documentation for accuracy and completeness, and I agree with the above. Gardiner Sleeper, M.D., Ph.D. 10/12/19 4:19 PM    Abbreviations: M myopia (nearsighted); A astigmatism; H hyperopia (farsighted); P presbyopia; Mrx spectacle prescription;  CTL contact lenses; OD right eye; OS left eye; OU both eyes  XT exotropia; ET esotropia; PEK punctate epithelial keratitis; PEE punctate epithelial erosions; DES dry eye syndrome; MGD meibomian gland dysfunction; ATs artificial tears; PFAT's preservative free artificial tears; [place] nuclear sclerotic cataract; PSC posterior subcapsular cataract; ERM epi-retinal membrane; PVD posterior vitreous detachment; RD retinal detachment; DM diabetes mellitus; DR diabetic retinopathy; NPDR non-proliferative diabetic retinopathy; PDR proliferative diabetic retinopathy; CSME clinically significant macular edema; DME diabetic macular edema; dbh dot blot hemorrhages; CWS cotton wool spot; POAG primary open angle glaucoma; C/D cup-to-disc ratio; HVF humphrey visual field; GVF goldmann visual field; OCT optical coherence tomography; IOP intraocular pressure; BRVO Branch retinal vein occlusion; CRVO central retinal vein occlusion; CRAO central retinal artery occlusion; BRAO  branch retinal artery occlusion; RT retinal tear; SB scleral buckle; PPV pars plana vitrectomy; VH Vitreous hemorrhage; PRP panretinal laser photocoagulation; IVK intravitreal kenalog; VMT vitreomacular traction; MH Macular hole;  NVD neovascularization of the disc; NVE neovascularization elsewhere; AREDS age related eye disease study; ARMD age related macular degeneration; POAG primary open angle glaucoma; EBMD epithelial/anterior basement membrane dystrophy; ACIOL anterior chamber intraocular lens; IOL intraocular lens; PCIOL posterior chamber intraocular lens; Phaco/IOL phacoemulsification with intraocular lens placement; [place] photorefractive keratectomy; LASIK laser assisted in situ keratomileusis; HTN hypertension; DM diabetes mellitus; COPD chronic obstructive pulmonary disease

## 2019-10-11 ENCOUNTER — Telehealth: Payer: Self-pay | Admitting: *Deleted

## 2019-10-11 DIAGNOSIS — Z5181 Encounter for therapeutic drug level monitoring: Secondary | ICD-10-CM

## 2019-10-11 DIAGNOSIS — N289 Disorder of kidney and ureter, unspecified: Secondary | ICD-10-CM

## 2019-10-11 DIAGNOSIS — I10 Essential (primary) hypertension: Secondary | ICD-10-CM

## 2019-10-11 MED ORDER — HYDRALAZINE HCL 25 MG PO TABS: 25.0000 mg | ORAL_TABLET | Freq: Two times a day (BID) | ORAL | 1 refills | Status: DC

## 2019-10-11 NOTE — Telephone Encounter (Signed)
-----   Message from Skeet Latch, MD sent at 10/08/2019  9:53 AM EDT ----- Cholesterol levels are very elevated.  Is she taking the Repatha and pravastatin.  It seems unlikely given these levels.  Kidney function is a little worse.  Stop losartan.  Start hydralazine 25mg  bid.  Repeat BMP in a month. Track BP at home and schedule f/u with PharmD in a month to discuss BP.

## 2019-10-11 NOTE — Telephone Encounter (Signed)
Spoke with patient and she d/c Repatha secondary to issues with it. Pravastatin was stopped several years ago as she did not tolerate. Reviewed medication change, need for repeat labs. Scheduled 1 month Pharm D appt. Will send message to Pharm D regarding cholesterol

## 2019-10-12 ENCOUNTER — Ambulatory Visit (INDEPENDENT_AMBULATORY_CARE_PROVIDER_SITE_OTHER): Payer: Medicare Other | Admitting: Ophthalmology

## 2019-10-12 ENCOUNTER — Encounter (INDEPENDENT_AMBULATORY_CARE_PROVIDER_SITE_OTHER): Payer: Self-pay | Admitting: Ophthalmology

## 2019-10-12 ENCOUNTER — Other Ambulatory Visit: Payer: Self-pay

## 2019-10-12 DIAGNOSIS — H353211 Exudative age-related macular degeneration, right eye, with active choroidal neovascularization: Secondary | ICD-10-CM

## 2019-10-12 DIAGNOSIS — I10 Essential (primary) hypertension: Secondary | ICD-10-CM

## 2019-10-12 DIAGNOSIS — Z961 Presence of intraocular lens: Secondary | ICD-10-CM

## 2019-10-12 DIAGNOSIS — H35033 Hypertensive retinopathy, bilateral: Secondary | ICD-10-CM | POA: Diagnosis not present

## 2019-10-12 DIAGNOSIS — H3581 Retinal edema: Secondary | ICD-10-CM

## 2019-10-12 DIAGNOSIS — E119 Type 2 diabetes mellitus without complications: Secondary | ICD-10-CM | POA: Diagnosis not present

## 2019-10-12 DIAGNOSIS — H401130 Primary open-angle glaucoma, bilateral, stage unspecified: Secondary | ICD-10-CM

## 2019-10-12 DIAGNOSIS — H353122 Nonexudative age-related macular degeneration, left eye, intermediate dry stage: Secondary | ICD-10-CM | POA: Diagnosis not present

## 2019-10-12 MED ORDER — BEVACIZUMAB CHEMO INJECTION 1.25MG/0.05ML SYRINGE FOR KALEIDOSCOPE
1.2500 mg | INTRAVITREAL | Status: AC | PRN
  Administered 2019-10-12: 1.25 mg via INTRAVITREAL

## 2019-10-23 ENCOUNTER — Other Ambulatory Visit: Payer: Self-pay | Admitting: Cardiovascular Disease

## 2019-10-23 MED ORDER — HYDRALAZINE HCL 25 MG PO TABS: 25.0000 mg | ORAL_TABLET | Freq: Two times a day (BID) | ORAL | 3 refills | Status: DC

## 2019-10-23 NOTE — Telephone Encounter (Signed)
*  STAT* If patient is at the pharmacy, call can be transferred to refill team.   1. Which medications need to be refilled? (please list name of each medication and dose if known) hydrALAZINE (APRESOLINE) 25 MG tablet  2. Which pharmacy/location (including street and city if local pharmacy) is medication to be sent to? [facility] - [place], [place] - [place] [address]  3. Do they need a 30 day or 90 day supply? 90 day supply

## 2019-11-02 DIAGNOSIS — H353211 Exudative age-related macular degeneration, right eye, with active choroidal neovascularization: Secondary | ICD-10-CM | POA: Diagnosis not present

## 2019-11-02 DIAGNOSIS — E1165 Type 2 diabetes mellitus with hyperglycemia: Secondary | ICD-10-CM | POA: Diagnosis not present

## 2019-11-02 DIAGNOSIS — I2510 Atherosclerotic heart disease of native coronary artery without angina pectoris: Secondary | ICD-10-CM | POA: Diagnosis not present

## 2019-11-02 DIAGNOSIS — I10 Essential (primary) hypertension: Secondary | ICD-10-CM | POA: Diagnosis not present

## 2019-11-07 NOTE — Progress Notes (Signed)
[facility] Note  11/16/2019     CHIEF COMPLAINT Patient presents for Retina Follow Up   HISTORY OF PRESENT ILLNESS: [name] is a 79 y.o. female who presents to the clinic today for:   HPI    Retina Follow Up    Patient presents with  Wet AMD.  In right eye.  This started weeks ago.  Severity is moderate.  Duration of weeks.  Since onset it is stable.  I, the attending physician,  performed the HPI with the patient and updated documentation appropriately.          Comments    Pt states vision is about the same OU.  Pt denies eye pain or discomfort and denies any new or worsening floaters or fol OU.    Pt c/o frequent "dizzy spells"..states she was taken off of hypertension medication due to dizziness and states BP this morning was approx 80/60       Last edited by Bernarda Caffey, MD on 11/17/2019 10:36 PM. (History)    Patient states her vision has been okay since last visit, she states her blood pressure has been very low, she thought it was her vertigo so her dr made her check her bp for 2 weeks and go back to them for a re-check where she was taken off her medications  Referring physician: Sharilyn Sites, MD [address] [place],  [place] [address]  HISTORICAL INFORMATION:   Selected notes from the [facility] Referred by Dr. Madelin Headings for concern of SRF OD LEE: 11.27.20 (M. Cotter) [BCVA: OD: 20/80-- OS: 20/60-]  Ocular Hx-glaucoma (latanoprost)  PMH-DM    CURRENT MEDICATIONS: Current Outpatient Medications (Ophthalmic Drugs)  Medication Sig  . latanoprost (XALATAN) 0.005 % ophthalmic solution Place 1 drop into both eyes at bedtime.   [name] tetrahydrozoline 0.05 % ophthalmic solution Place 1 drop into both eyes at bedtime.   No current facility-administered medications for this visit. (Ophthalmic Drugs)   Current Outpatient Medications (Other)  Medication Sig  . albuterol (PROVENTIL HFA;VENTOLIN HFA) 108 (90 BASE)  MCG/ACT inhaler Inhale 2 puffs into the lungs every 4 (four) hours as needed for shortness of breath.  [name] amitriptyline (ELAVIL) 25 MG tablet Take 50 mg by mouth at bedtime.   . Black Cohosh 40 MG CAPS Take 40 mg by mouth 2 (two) times daily.  . carvedilol (COREG) 6.25 MG tablet Take 1 tablet (6.25 mg total) by mouth 2 (two) times daily. Please keep upcoming appt for refills. Thanks!  . cetirizine (ZYRTEC) 10 MG tablet Take 10 mg by mouth daily.  . Cholecalciferol (VITAMIN D-3) 1000 units CAPS Take 1 capsule by mouth daily.  [name] esomeprazole (NEXIUM) 20 MG capsule Take 20 mg by mouth daily at 12 noon.  . furosemide (LASIX) 40 MG tablet TAKE 1 TABLET (40 MG TOTAL) BY MOUTH DAILY. MAY TAKE EXTRA DAILY AS NEEDED FOR SWELLING  . gabapentin (NEURONTIN) 300 MG capsule Take 300 mg by mouth 3 (three) times daily.  [name] glimepiride (AMARYL) 2 MG tablet Take 2 mg by mouth daily.  . hydrALAZINE (APRESOLINE) 25 MG tablet Take 1 tablet (25 mg total) by mouth in the morning and at bedtime.  . isosorbide mononitrate (IMDUR) 30 MG 24 hr tablet TAKE 1 TABLET BY MOUTH  DAILY  . meclizine (ANTIVERT) 25 MG tablet Take 25 mg by mouth as needed for dizziness.   . Omega-3 Fatty Acids (FISH OIL) 1200 MG CAPS Take 1 capsule  by mouth 2 (two) times daily.  . polyethylene glycol (MIRALAX / GLYCOLAX) packet Take 17 g by mouth daily.  . potassium chloride SA (KLOR-CON) 20 MEQ tablet TAKE ONE-HALF TABLET BY  MOUTH DAILY  . sitaGLIPtin (JANUVIA) 50 MG tablet Take 1 tablet by mouth daily.  . vitamin B-12 (CYANOCOBALAMIN) 100 MCG tablet Take 1 tablet by mouth daily.  [name] warfarin (COUMADIN) 1 MG tablet Take 1 mg by mouth daily.   No current facility-administered medications for this visit. (Other)      REVIEW OF SYSTEMS: ROS    Positive for: Gastrointestinal, Genitourinary, Endocrine, Cardiovascular, Eyes   Negative for: Constitutional, Neurological, Skin, Musculoskeletal, HENT, Respiratory, Psychiatric, Allergic/Imm, Heme/Lymph    Last edited by Doneen Poisson on 11/16/2019  2:01 PM. (History)       ALLERGIES Allergies  Allergen Reactions  . Vioxx [Rofecoxib] Shortness Of Breath  . Metformin And Related     Kidney failure  . Penicillins     rash  . Pravastatin   . Codeine Rash  . Motrin [Ibuprofen] Rash  . Sulfur Rash    PAST MEDICAL HISTORY Past Medical History:  Diagnosis Date  . Antral gastritis    EGD 11/15  . Asthmatic bronchitis   . Back pain   . Chronic diastolic heart failure (HCC) 05/20/2015   Grade 2 diastolic dysfunction.  04/2015.  [name] Chronic kidney disease    kidney function low  . Diabetes mellitus    x 5 yrs  . DVT of axillary vein, acute left ([place]) 07/24/12  . GERD (gastroesophageal reflux disease)   . Glaucoma    POAG OU  . Hyperlipidemia 05/20/2015  . Hypertension   . Hypertensive retinopathy    OU  . Hypothyroidism   . Kidney stones   . Macular degeneration    Wet OD, Dry OS  . Mixed hyperlipidemia   . Peripheral venous insufficiency   . Pinched nerve    right elbow  . Sigmoid diverticulitis   . Vertigo    chonic   Past Surgical History:  Procedure Laterality Date  . ABDOMINAL HYSTERECTOMY    . BACK SURGERY     spinal   . CARDIAC CATHETERIZATION N/A 05/26/2015   Procedure: Left Heart Cath and Coronary Angiography;  Surgeon: Jettie Booze, MD;  Location: [facility];  Service: Cardiovascular;  Laterality: N/A;  . CATARACT EXTRACTION Bilateral   . CHOLECYSTECTOMY    . COLON SURGERY    . COLONOSCOPY N/A 12/27/2013   Procedure: COLONOSCOPY;  Surgeon: Rogene Houston, MD;  [facility];  Service: Endoscopy;  Laterality: N/A;  200  . COLOSTOMY CLOSURE    . ESOPHAGOGASTRODUODENOSCOPY N/A 05/07/2014   Procedure: ESOPHAGOGASTRODUODENOSCOPY (EGD);  Surgeon: Rogene Houston, MD;  [facility]: [facility];  Service: Endoscopy;  Laterality: N/A;  . EYE SURGERY Bilateral    Cat Sx  . fracture left foot    . HERNIA REPAIR    . NM MYOCAR PERF  WALL MOTION  01/28/2009   Normal  . OTHER SURGICAL HISTORY     colostomy, colostomy reversal, for diverticulitis surgical hernia repair, arm surgery, neck surgery  . US ECHOCARDIOGRAPHY  02/11/2010   Mild MR,trace TR & AI    FAMILY HISTORY Family History  Problem Relation Age of Onset  . Other Mother 72       Cause unknown  . Cancer Mother        liver  . CVA Maternal Grandmother 55  deceased  . Heart disease Maternal Grandmother   . Stroke Maternal Grandmother   . Heart attack Brother 8       deceased  . Cancer Sister 55       deceased  . Glaucoma Maternal Uncle     SOCIAL HISTORY Social History   Tobacco Use  . Smoking status: Former Smoker    Types: Cigarettes    Quit date: 12/10/2013    Years since quitting: 5.9  . Smokeless tobacco: Never Used  . Tobacco comment: Smoke 1-1 1/2 packs a day  Substance Use Topics  . Alcohol use: No  . Drug use: No         OPHTHALMIC EXAM:  Base Eye Exam    Visual Acuity (Snellen - Linear)      Right Left   Dist [place] 20/40 +2 20/30 -2   Dist ph [place] 20/30 -1 20/25 -2       Tonometry (Tonopen, 2:06 PM)      Right Left   Pressure 14 13       Pupils      Dark Light Shape React APD   Right 2 1 Round Minimal 0   Left 2 1 Round Minimal 0       Visual Fields      Left Right    Full Full       Extraocular Movement      Right Left    Full Full       Neuro/Psych    Oriented x3: Yes   Mood/Affect: Normal       Dilation    Both eyes: 1.0% Mydriacyl, 2.5% Phenylephrine @ 2:06 PM        Slit Lamp and Fundus Exam    Slit Lamp Exam      Right Left   Lids/Lashes Dermatochalasis - upper lid, Meibomian gland dysfunction Dermatochalasis - upper lid, Telangiectasia, mild Meibomian gland dysfunction   Conjunctiva/Sclera White and quiet White and quiet   Cornea Trace Punctate epithelial erosions, no epi defect 1+ inferior Punctate epithelial erosions   Anterior Chamber Deep and quiet, narrow temporal angle Deep and  quiet, narrow temporal angle   Iris Round and moderately dilated to 5.66mm Round and moderately dilated to 5.107mm   Lens Posterior chamber intraocular lens Posterior chamber intraocular lens   Vitreous Vitreous syneresis Vitreous syneresis       Fundus Exam      Right Left   Disc Sharp rim, Pallor, +cupping, mild, temporal Peripapillary atrophy Pink and Sharp, temporal Peripapillary atrophy   C/D Ratio 0.75 0.6   Macula Blunted foveal reflex, +focal CNV/PED nasal macula with stable improvement in SRF, mild interval increase in IRF/cystic changes, Drusen Flat, Blunted foveal reflex, drusen, Retinal pigment epithelial mottling, No heme or edema   Vessels Vascular attenuation, Tortuous Vascular attenuation, Tortuous   Periphery Attached; no heme Attached; no heme          IMAGING AND PROCEDURES  Imaging and Procedures for @TODAY @  OCT, Retina - OU - Both Eyes       Right Eye Quality was good. Central Foveal Thickness: 201. Progression has worsened. Findings include intraretinal hyper-reflective material, pigment epithelial detachment, subretinal hyper-reflective material, retinal drusen , normal foveal contour, outer retinal atrophy, no IRF, no SRF (Mild interval increase in IRF/cystic changes overlying PED nasal macula).   Left Eye Quality was good. Central Foveal Thickness: 211. Progression has been stable. Findings include normal foveal contour, no IRF, no SRF, retinal drusen .  Notes *Images captured and stored on drive  Diagnosis / Impression:  OD: exudative ARMD; Mild interval increase in IRF/cystic changes overlying PED nasal macula OS: NFP, no IRF/SRF; +drusen -- nonexudative ARMD  Clinical management:  See below  Abbreviations: NFP - Normal foveal profile. CME - cystoid macular edema. PED - pigment epithelial detachment. IRF - intraretinal fluid. SRF - subretinal fluid. EZ - ellipsoid zone. ERM - epiretinal membrane. ORA - outer retinal atrophy. ORT - outer retinal  tubulation. SRHM - subretinal hyper-reflective material        Intravitreal Injection, Pharmacologic Agent - OD - Right Eye       Time Out 11/16/2019. 2:07 PM. Confirmed correct patient, procedure, site, and patient consented.   Anesthesia Topical anesthesia was used. Anesthetic medications included Lidocaine 2%, Proparacaine 0.5%.   Procedure Preparation included 5% betadine to ocular surface, eyelid speculum. A supplied needle was used.   Injection:  1.25 mg Bevacizumab (AVASTIN) SOLN   NDC: [id number], Lot: [id number] , Expiration date: 01/16/2020   Route: Intravitreal, Site: Right Eye, Waste: 0 mL  Post-op Post injection exam found visual acuity of at least counting fingers. The patient tolerated the procedure well. There were no complications. The patient received written and verbal post procedure care education.                 ASSESSMENT/PLAN:    ICD-10-CM   1. Exudative age-related macular degeneration of right eye with active choroidal neovascularization (HCC)  H35.3211 Intravitreal Injection, Pharmacologic Agent - OD - Right Eye    Bevacizumab (AVASTIN) SOLN 1.25 mg  2. Retinal edema  H35.81 OCT, Retina - OU - Both Eyes  3. Intermediate stage nonexudative age-related macular degeneration of left eye  H35.3122   4. Diabetes mellitus type 2 without retinopathy ([place])  E11.9   5. Hypertensive retinopathy of both eyes  H35.033   6. Essential hypertension  I10   7. Pseudophakia of both eyes  Z96.1   8. Primary open angle glaucoma of both eyes, unspecified glaucoma stage  H40.1130    1,2. 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, OD    - delayed follow up from 4 weeks to 8 weeks due to passing of husband (12.11.20-02.12.21)  - s/p IVA OD #1 (12.11.20), #2 (02.12.21), #3 (03.12.21), #4 (04.09.21)  - FA 12.11.20 confirms +CNVM  - OCT today shows mild interval increase in IRF overlying nasal PED  - BCVA OD stable at 20/30  - recommend IVA OD #5 today, 05.14.21    - pt wishes to proceed with injection  - RBA of procedure discussed, questions answered  - informed consent obtained and signed  - see procedure note  - Avastin informed consent form signed and scanned on 12.11.2020 (OD)  - of note, pt presented acutely on 03.16.21 due to decreased vision after injection OD  - exam showed central PEE and focal SPK -- ?closed/healed epi defect OD -- remains resolved today  - f/u in 5 wks -- DFE/OCT/possible injection  3. Age related macular degeneration, non-exudative, OS  - intermediate stage  - The incidence, anatomy, and pathology of dry AMD, risk of progression, and the AREDS and AREDS 2 study including smoking risks discussed with patient.  - Recommend amsler grid monitoring  4. Diabetes mellitus, type 2 without retinopathy  - The incidence, risk factors for progression, natural history and treatment options for diabetic retinopathy  were discussed with patient.    - The need for close monitoring of blood glucose, blood pressure, and serum lipids,  avoiding cigarette or any type of tobacco, and the need for long term follow up was also discussed with patient.  - monitor  5,6. Hypertensive retinopathy OU  - discussed importance of tight BP control  - monitor  7. Pseudophakia OU  - s/p CE/IOL (Dr. Venetia Maxon)  - beautiful surgery, doing well  - monitor  8. POAG OU  - formerly managed by Dr. Venetia Maxon  - s/p laser w/ Dr. Venetia Maxon -- ?SLT  - IOP 14,13 OU  - currently on latanoprost QHS  - monitor   Ophthalmic Meds Ordered this visit:  Meds ordered this encounter  Medications  . Bevacizumab (AVASTIN) SOLN 1.25 mg       Return in about 5 weeks (around 12/21/2019) for f/u exu ARMD OD, DFE, OCT.  There are no Patient Instructions on file for this visit.   Explained the diagnoses, plan, and follow up with the patient and they expressed understanding.  Patient expressed understanding of the importance of proper follow up care.   This  document serves as a record of services personally performed by Gardiner Sleeper, MD, PhD. It was created on their behalf by Ernest Mallick, OA, an ophthalmic assistant. The creation of this record is the provider's dictation and/or activities during the visit.    Electronically signed by: Ernest Mallick, OA 05.05.2021 10:41 PM   Gardiner Sleeper, M.D., Ph.D. Diseases & Surgery of the Retina and Vitreous [facility]  I have reviewed the above documentation for accuracy and completeness, and I agree with the above. Gardiner Sleeper, M.D., Ph.D. 11/17/19 10:41 PM   Abbreviations: M myopia (nearsighted); A astigmatism; H hyperopia (farsighted); P presbyopia; Mrx spectacle prescription;  CTL contact lenses; OD right eye; OS left eye; OU both eyes  XT exotropia; ET esotropia; PEK punctate epithelial keratitis; PEE punctate epithelial erosions; DES dry eye syndrome; MGD meibomian gland dysfunction; ATs artificial tears; PFAT's preservative free artificial tears; [place] nuclear sclerotic cataract; PSC posterior subcapsular cataract; ERM epi-retinal membrane; PVD posterior vitreous detachment; RD retinal detachment; DM diabetes mellitus; DR diabetic retinopathy; NPDR non-proliferative diabetic retinopathy; PDR proliferative diabetic retinopathy; CSME clinically significant macular edema; DME diabetic macular edema; dbh dot blot hemorrhages; CWS cotton wool spot; POAG primary open angle glaucoma; C/D cup-to-disc ratio; HVF humphrey visual field; GVF goldmann visual field; OCT optical coherence tomography; IOP intraocular pressure; BRVO Branch retinal vein occlusion; CRVO central retinal vein occlusion; CRAO central retinal artery occlusion; BRAO branch retinal artery occlusion; RT retinal tear; SB scleral buckle; PPV pars plana vitrectomy; VH Vitreous hemorrhage; PRP panretinal laser photocoagulation; IVK intravitreal kenalog; VMT vitreomacular traction; MH Macular hole;  NVD neovascularization of  the disc; NVE neovascularization elsewhere; AREDS age related eye disease study; ARMD age related macular degeneration; POAG primary open angle glaucoma; EBMD epithelial/anterior basement membrane dystrophy; ACIOL anterior chamber intraocular lens; IOL intraocular lens; PCIOL posterior chamber intraocular lens; Phaco/IOL phacoemulsification with intraocular lens placement; [place] photorefractive keratectomy; LASIK laser assisted in situ keratomileusis; HTN hypertension; DM diabetes mellitus; COPD chronic obstructive pulmonary disease

## 2019-11-08 DIAGNOSIS — E119 Type 2 diabetes mellitus without complications: Secondary | ICD-10-CM | POA: Diagnosis not present

## 2019-11-13 ENCOUNTER — Ambulatory Visit (INDEPENDENT_AMBULATORY_CARE_PROVIDER_SITE_OTHER): Payer: Medicare Other | Admitting: Pharmacist Clinician (PhC)/ Clinical Pharmacy Specialist

## 2019-11-13 ENCOUNTER — Other Ambulatory Visit: Payer: Self-pay

## 2019-11-13 VITALS — BP 130/72 | HR 70 | Ht 62.25 in | Wt 169.8 lb

## 2019-11-13 DIAGNOSIS — I10 Essential (primary) hypertension: Secondary | ICD-10-CM | POA: Diagnosis not present

## 2019-11-13 DIAGNOSIS — E7849 Other hyperlipidemia: Secondary | ICD-10-CM | POA: Diagnosis not present

## 2019-11-13 DIAGNOSIS — G720 Drug-induced myopathy: Secondary | ICD-10-CM

## 2019-11-13 DIAGNOSIS — T466X5A Adverse effect of antihyperlipidemic and antiarteriosclerotic drugs, initial encounter: Secondary | ICD-10-CM

## 2019-11-13 NOTE — Progress Notes (Signed)
11/14/2019 [name] [birth year] [id number]   HPI:  [name] is a 79 y.o. female patient of Dr Oval Linsey, who presents today for a lipid clinic evaluation.  See pertinent past medical history below.   She has been seen in [facility] in the past for cholesterol management.  She was started on Repatha about 2 years ago, but was only able to take for 3-4 doses due to it causing her to have flares of bronchitis, of which she is apparently prone.  She has had a difficult year, as her husband died in 2022/09/27 from COVID/pneumonia.  She becomes tearful in the office when talking about him.    Today she has issues with ongoing dizziness.  She notes that the meclizine helps, but she still will be unable to get around for a few minutes to hours when a spell hits her.  Her most recent was when visiting a sister in [place], [place] and had to call her son/grandson to drive up and get her.    Home BP cuff - wrist 122/74 68  Past Medical History: ASCVD 50% LAD  HFpEF Grade 2 chronic  hypertension Well controlled  - on carvedilol, hydralazine  Hx of DVT On chronic warfarin  DM2 06/2019 A1c 8.5 - on glimepiride, sitagliptin     Current Medications:  Carvedilol 6.25 mg bid, hydralazine 25 mg bid  No medications for cholesterol at this time  Cholesterol Goals: LDL < 70   Intolerant/previously tried: repatha - bronchitis, pravastatin, rosuvastatin - myalgias  Family history: maternal grandmother with multiple MI's died from stroke in her 29's; brother with MI died at 17  Diet: mostly home cooked, appetite not as good in past few months  Exercise:  Household chores  Home BP readings:  Has been checking 1-2 times daily, most days.  Average systolic BP for past 3 weeks was [id number] systolic.  Diastolic all WNL.  Lowest systolic 88, highest 123456.  Multiple readings < 123XX123 systolic.  Home cuff is wrist monitor that read within 10 points of office cuff.  Labs: 4/21:  TC 225, TG 189, HDL 43, LDL 148   Current  Outpatient Medications  Medication Sig Dispense Refill  . albuterol (PROVENTIL HFA;VENTOLIN HFA) 108 (90 BASE) MCG/ACT inhaler Inhale 2 puffs into the lungs every 4 (four) hours as needed for shortness of breath. 1 Inhaler 0  . amitriptyline (ELAVIL) 25 MG tablet Take 50 mg by mouth at bedtime.     . Black Cohosh 40 MG CAPS Take 40 mg by mouth 2 (two) times daily.    . carvedilol (COREG) 6.25 MG tablet Take 1 tablet (6.25 mg total) by mouth 2 (two) times daily. Please keep upcoming appt for refills. Thanks! 180 tablet 0  . cetirizine (ZYRTEC) 10 MG tablet Take 10 mg by mouth daily.    . Cholecalciferol (VITAMIN D-3) 1000 units CAPS Take 1 capsule by mouth daily.    [name] esomeprazole (NEXIUM) 20 MG capsule Take 20 mg by mouth daily at 12 noon.    . furosemide (LASIX) 40 MG tablet TAKE 1 TABLET (40 MG TOTAL) BY MOUTH DAILY. MAY TAKE EXTRA DAILY AS NEEDED FOR SWELLING 180 tablet 1  . gabapentin (NEURONTIN) 300 MG capsule Take 300 mg by mouth 3 (three) times daily.    [name] glimepiride (AMARYL) 2 MG tablet Take 2 mg by mouth daily.  2  . hydrALAZINE (APRESOLINE) 25 MG tablet Take 1 tablet (25 mg total) by mouth in the morning and at bedtime. [place]  tablet 3  . isosorbide mononitrate (IMDUR) 30 MG 24 hr tablet TAKE 1 TABLET BY MOUTH  DAILY 90 tablet 3  . latanoprost (XALATAN) 0.005 % ophthalmic solution Place 1 drop into both eyes at bedtime.     . meclizine (ANTIVERT) 25 MG tablet Take 25 mg by mouth as needed for dizziness.     . Omega-3 Fatty Acids (FISH OIL) 1200 MG CAPS Take 1 capsule by mouth 2 (two) times daily.    . polyethylene glycol (MIRALAX / GLYCOLAX) packet Take 17 g by mouth daily.    . potassium chloride SA (KLOR-CON) 20 MEQ tablet TAKE ONE-HALF TABLET BY  MOUTH DAILY 45 tablet 3  . sitaGLIPtin (JANUVIA) 50 MG tablet Take 1 tablet by mouth daily.    [name] tetrahydrozoline 0.05 % ophthalmic solution Place 1 drop into both eyes at bedtime.    . vitamin B-12 (CYANOCOBALAMIN) 100 MCG tablet Take 1  tablet by mouth daily.    [name] warfarin (COUMADIN) 1 MG tablet Take 1 mg by mouth daily.  11   No current facility-administered medications for this visit.    Allergies  Allergen Reactions  . Vioxx [Rofecoxib] Shortness Of Breath  . Metformin And Related     Kidney failure  . Penicillins     rash  . Pravastatin   . Codeine Rash  . Motrin [Ibuprofen] Rash  . Sulfur Rash    Past Medical History:  Diagnosis Date  . Antral gastritis    EGD 11/15  . Asthmatic bronchitis   . Back pain   . Chronic diastolic heart failure (HCC) 05/20/2015   Grade 2 diastolic dysfunction.  04/2015.  [name] Chronic kidney disease    kidney function low  . Diabetes mellitus    x 5 yrs  . DVT of axillary vein, acute left ([place]) 07/24/12  . GERD (gastroesophageal reflux disease)   . Glaucoma    POAG OU  . Hyperlipidemia 05/20/2015  . Hypertension   . Hypertensive retinopathy    OU  . Hypothyroidism   . Kidney stones   . Macular degeneration    Wet OD, Dry OS  . Mixed hyperlipidemia   . Peripheral venous insufficiency   . Pinched nerve    right elbow  . Sigmoid diverticulitis   . Vertigo    chonic    Blood pressure 130/72, pulse 70, height 5' 2.25" (1.581 m), weight 169 lb 12.8 oz (77 kg).   HTN (hypertension) Patient with essential hypertension, now more concern for hypotension.  Not sure if low BP readings at home are contributing to her ongoing dizzy spells.  With her home readings often < 123XX123 systolic at age 52, I am going to have her stop the hydralazine for now.  She is to continue with regular home BP monitoring and I asked that she call back in 3-4 weeks and just let us know what her home readings look like.  We may have to compromise and use 10 mg hydralazine should her pressure go up too much.    Hyperlipidemia Patient with mixed hyperlipidemia, unable to tolerate statin drugs.  Unfortunately Repatha caused her to have flares of bronchitis within 24-36 hours of doses.  We discussed  options of trying the lower dose Praluent or Nexletol/Nexlezet.  Patient was slightly teary when discussing injections, as her late husband had been the one to give her the injections.  She would be willing to try, but would prefer to see if the oral tablet would do as well.  With her LDL at 148 would probably do better with Nexlizet.  Will start her on a week of Nexletol, then have her transition to Nexlizet, to be sure she tolerates well.  She does not recall having taken ezetimibe in the past.  I asked that she call in about 3 weeks and let us know how she is doing with the samples and we can determine which will be best for her.  She was given information on [place] to help with cost of these branded drugs.     Tommy Medal PharmD CPP [facility] [address] [place], [place] [address] [phone]

## 2019-11-13 NOTE — Patient Instructions (Addendum)
Call in 3-4 weeks and let us know what your blood pressure is doing.  Also let us know if you tolerate the Nexletol or Nexlizet.   Raylon Lamson/Raquel at [phone]  Check your blood pressure at home daily and keep record of the readings.  Take your meds as follows:   STOP taking hydralazine    Start Nexletol and take for 2 weeks.  If you do well with this, start the [place] when those samples are gone.    Bring all of your meds, your BP cuff and your record of home blood pressures to your next appointment.  Exercise as you're able, try to walk approximately 30 minutes per day.  Keep salt intake to a minimum, especially watch canned and prepared boxed foods.  Eat more fresh fruits and vegetables and fewer canned items.  Avoid eating in fast food restaurants.    HOW TO TAKE YOUR BLOOD PRESSURE: . Rest 5 minutes before taking your blood pressure. .  Don't smoke or drink caffeinated beverages for at least 30 minutes before. . Take your blood pressure before (not after) you eat. . Sit comfortably with your back supported and both feet on the floor (don't cross your legs). . Elevate your arm to heart level on a table or a desk. . Use the proper sized cuff. It should fit smoothly and snugly around your bare upper arm. There should be enough room to slip a fingertip under the cuff. The bottom edge of the cuff should be 1 inch above the crease of the elbow. . Ideally, take 3 measurements at one sitting and record the average.  Your Results:             Your most recent labs Goal  Total Cholesterol 225 < 200  Triglycerides 189 < 150  HDL (happy/good cholesterol) 43 > 40  LDL (lousy/bad cholesterol 148 < 70      Patient Assistance:  The [employer] foundation offers assistance to help pay for medication copays.  They will cover copays for all cholesterol lowering meds, including statins, fibrates, omega-3 oils, ezetimibe, Repatha, Praluent, Nexletol, Nexlizet.  The cards are usually good for  $2,500 or 12 months, whichever comes first. 1. Go to healthwellfoundation.org 2. Click on "Apply Now" 3. Answer questions as to whom is applying (patient or representative) 4. Your disease fund will be "hypercholesterolemia - Medicare access" 5. They will ask questions about finances and which medications you are taking for cholesterol 6. When you submit, the approval is usually within minutes.  You will need to print the card information from the site 7. You will need to show this information to your pharmacy, they will bill your Medicare Part D plan first -then bill [employer] --for the copay.   You can also call them at [phone], although the hold times can be quite long.   Thank you for choosing [facility]

## 2019-11-14 ENCOUNTER — Encounter: Payer: Self-pay | Admitting: Pharmacist Clinician (PhC)/ Clinical Pharmacy Specialist

## 2019-11-14 NOTE — Assessment & Plan Note (Signed)
Patient with essential hypertension, now more concern for hypotension.  Not sure if low BP readings at home are contributing to her ongoing dizzy spells.  With her home readings often < [id number] systolic at age 79, I am going to have her stop the hydralazine for now.  She is to continue with regular home BP monitoring and I asked that she call back in 3-4 weeks and just let us know what her home readings look like.  We may have to compromise and use 10 mg hydralazine should her pressure go up too much.

## 2019-11-14 NOTE — Assessment & Plan Note (Signed)
Patient with mixed hyperlipidemia, unable to tolerate statin drugs.  Unfortunately Repatha caused her to have flares of bronchitis within 24-36 hours of doses.  We discussed options of trying the lower dose Praluent or Nexletol/Nexlezet.  Patient was slightly teary when discussing injections, as her late husband had been the one to give her the injections.  She would be willing to try, but would prefer to see if the oral tablet would do as well.  With her LDL at 148 would probably do better with Nexlizet.  Will start her on a week of Nexletol, then have her transition to Nexlizet, to be sure she tolerates well.  She does not recall having taken ezetimibe in the past.  I asked that she call in about 3 weeks and let us know how she is doing with the samples and we can determine which will be best for her.  She was given information on [employer] to help with cost of these branded drugs.

## 2019-11-16 ENCOUNTER — Ambulatory Visit (INDEPENDENT_AMBULATORY_CARE_PROVIDER_SITE_OTHER): Payer: Medicare Other | Admitting: Ophthalmology

## 2019-11-16 ENCOUNTER — Other Ambulatory Visit: Payer: Self-pay | Admitting: Cardiovascular Disease

## 2019-11-16 ENCOUNTER — Other Ambulatory Visit: Payer: Self-pay

## 2019-11-16 DIAGNOSIS — H353122 Nonexudative age-related macular degeneration, left eye, intermediate dry stage: Secondary | ICD-10-CM | POA: Diagnosis not present

## 2019-11-16 DIAGNOSIS — H3581 Retinal edema: Secondary | ICD-10-CM

## 2019-11-16 DIAGNOSIS — H353211 Exudative age-related macular degeneration, right eye, with active choroidal neovascularization: Secondary | ICD-10-CM | POA: Diagnosis not present

## 2019-11-16 DIAGNOSIS — Z961 Presence of intraocular lens: Secondary | ICD-10-CM

## 2019-11-16 DIAGNOSIS — H35033 Hypertensive retinopathy, bilateral: Secondary | ICD-10-CM

## 2019-11-16 DIAGNOSIS — H401130 Primary open-angle glaucoma, bilateral, stage unspecified: Secondary | ICD-10-CM

## 2019-11-16 DIAGNOSIS — I10 Essential (primary) hypertension: Secondary | ICD-10-CM

## 2019-11-16 DIAGNOSIS — E119 Type 2 diabetes mellitus without complications: Secondary | ICD-10-CM | POA: Diagnosis not present

## 2019-11-17 ENCOUNTER — Encounter (INDEPENDENT_AMBULATORY_CARE_PROVIDER_SITE_OTHER): Payer: Self-pay | Admitting: Ophthalmology

## 2019-11-17 MED ORDER — BEVACIZUMAB CHEMO INJECTION 1.25MG/0.05ML SYRINGE FOR KALEIDOSCOPE
1.2500 mg | INTRAVITREAL | Status: AC | PRN
  Administered 2019-11-17: 1.25 mg via INTRAVITREAL

## 2019-12-03 DIAGNOSIS — H353211 Exudative age-related macular degeneration, right eye, with active choroidal neovascularization: Secondary | ICD-10-CM | POA: Diagnosis not present

## 2019-12-03 DIAGNOSIS — E1165 Type 2 diabetes mellitus with hyperglycemia: Secondary | ICD-10-CM | POA: Diagnosis not present

## 2019-12-03 DIAGNOSIS — I10 Essential (primary) hypertension: Secondary | ICD-10-CM | POA: Diagnosis not present

## 2019-12-03 DIAGNOSIS — I2510 Atherosclerotic heart disease of native coronary artery without angina pectoris: Secondary | ICD-10-CM | POA: Diagnosis not present

## 2019-12-17 ENCOUNTER — Telehealth: Payer: Self-pay | Admitting: Cardiovascular Disease

## 2019-12-17 ENCOUNTER — Other Ambulatory Visit: Payer: Self-pay | Admitting: Cardiovascular Disease

## 2019-12-17 MED ORDER — NEXLIZET 180-10 MG PO TABS: 1.0000 | ORAL_TABLET | Freq: Every day | ORAL | 11 refills | Status: DC

## 2019-12-17 NOTE — Telephone Encounter (Signed)
Left message [name]   - There are samples of Nexlizet  180 /10 mg (tab) 2 weeks worth patient can come and pick from office. It will be at the front desk.

## 2019-12-17 NOTE — Telephone Encounter (Signed)
*  STAT* If patient is at the pharmacy, call can be transferred to refill team.   1. Which medications need to be refilled? (please list name of each medication and dose if known) Nexlizet  2. Which pharmacy/location (including street and city if local pharmacy) is medication to be sent to? [facility] - [place], [place] - [place] [address]  3. Do they need a 30 day or 90 day supply? 30 day supply

## 2019-12-17 NOTE — Telephone Encounter (Signed)
E-sent prescription 30 tablet x 11 refills

## 2019-12-17 NOTE — Telephone Encounter (Signed)
Patient calling the office for samples of medication:   1.  What medication and dosage are you requesting samples for?  Nexlizet   2.  Are you currently out of this medication? Yes

## 2019-12-20 NOTE — Progress Notes (Signed)
[facility]  12/21/2019     CHIEF COMPLAINT Patient presents for Retina Follow Up   HISTORY OF PRESENT ILLNESS: [name] is a 79 y.o. female who presents to the clinic today for:   HPI    Retina Follow Up    Patient presents with  Wet AMD.  In right eye.  This started months ago.  Severity is moderate.  Duration of 5 weeks.  Since onset it is stable.  I, the attending physician,  performed the HPI with the patient and updated documentation appropriately.          Comments    79 y/o female pt here for 5 wk f/u for exu ARMD OD.  VA OD may be slightly worse.  No changes reported OS.  Denies pain, FOL, but reports floaters OU, with an increase in size of floaters OD over the past 2 wks.  Latanoprost QHS OU.  BS 127 this a.m.  A1C unknown.  Has been having dizzy/disorienting spells due to fluctuations in BP.       Last edited by Bernarda Caffey, MD on 12/23/2019  1:44 AM. (History)      Referring physician: Sharilyn Sites, MD [place],  [place] [id number]  HISTORICAL INFORMATION:   Selected notes from the MEDICAL RECORD NUMBER Referred by Dr. Madelin Headings for concern of SRF OD LEE: 11.27.20 (M. Cotter) [BCVA: OD: 20/80-- OS: 20/60-]  Ocular Hx-glaucoma (latanoprost)  PMH-DM    CURRENT MEDICATIONS: Current Outpatient Medications (Ophthalmic Drugs)  Medication Sig   latanoprost (XALATAN) 0.005 % ophthalmic solution Place 1 drop into both eyes at bedtime.    tetrahydrozoline 0.05 % ophthalmic solution Place 1 drop into both eyes at bedtime. (Patient not taking: Reported on 12/21/2019)   No current facility-administered medications for this visit. (Ophthalmic Drugs)   Current Outpatient Medications (Other)  Medication Sig   albuterol (PROVENTIL HFA;VENTOLIN HFA) 108 (90 BASE) MCG/ACT inhaler Inhale 2 puffs into the lungs every 4 (four) hours as needed for shortness of breath.   amitriptyline (ELAVIL) 25 MG tablet Take 50 mg  by mouth at bedtime.    Bempedoic Acid-Ezetimibe (NEXLIZET) 180-10 MG TABS Take 1 tablet by mouth daily.   Black Cohosh 40 MG CAPS Take 40 mg by mouth 2 (two) times daily.   carvedilol (COREG) 6.25 MG tablet TAKE 1 TABLET BY MOUTH  TWICE DAILY   cetirizine (ZYRTEC) 10 MG tablet Take 10 mg by mouth daily.   Cholecalciferol (VITAMIN D-3) 1000 units CAPS Take 1 capsule by mouth daily.   esomeprazole (NEXIUM) 20 MG capsule Take 20 mg by mouth daily at 12 noon.   furosemide (LASIX) 40 MG tablet TAKE 1 TABLET (40 MG TOTAL) BY MOUTH DAILY. MAY TAKE EXTRA DAILY AS NEEDED FOR SWELLING   gabapentin (NEURONTIN) 300 MG capsule Take 300 mg by mouth 3 (three) times daily.   glimepiride (AMARYL) 2 MG tablet Take 2 mg by mouth daily.   hydrALAZINE (APRESOLINE) 25 MG tablet Take 1 tablet (25 mg total) by mouth in the morning and at bedtime.   isosorbide mononitrate (IMDUR) 30 MG 24 hr tablet TAKE 1 TABLET BY MOUTH  DAILY   meclizine (ANTIVERT) 25 MG tablet Take 25 mg by mouth as needed for dizziness.    Omega-3 Fatty Acids (FISH OIL) 1200 MG CAPS Take 1 capsule by mouth 2 (two) times daily.   polyethylene glycol (MIRALAX / GLYCOLAX) packet Take 17 g by mouth daily.  potassium chloride SA (KLOR-CON) 20 MEQ tablet TAKE ONE-HALF TABLET BY  MOUTH DAILY   sitaGLIPtin (JANUVIA) 50 MG tablet Take 1 tablet by mouth daily.   vitamin B-12 (CYANOCOBALAMIN) 100 MCG tablet Take 1 tablet by mouth daily.   warfarin (COUMADIN) 1 MG tablet Take 1 mg by mouth daily.   No current facility-administered medications for this visit. (Other)      REVIEW OF SYSTEMS: ROS    Positive for: Genitourinary, Endocrine, Cardiovascular, Eyes   Negative for: Constitutional, Gastrointestinal, Neurological, Skin, Musculoskeletal, HENT, Respiratory, Psychiatric, Allergic/Imm, Heme/Lymph   Last edited by Matthew Folks, COA on 12/21/2019  2:22 PM. (History)       ALLERGIES Allergies  Allergen Reactions   Vioxx  [Rofecoxib] Shortness Of Breath   Metformin And Related     Kidney failure   Penicillins     rash   Pravastatin    Codeine Rash   Motrin [Ibuprofen] Rash   Sulfur Rash    PAST MEDICAL HISTORY Past Medical History:  Diagnosis Date   Antral gastritis    EGD 11/15   Asthmatic bronchitis    Back pain    Chronic diastolic heart failure ([place]) 05/20/2015   Grade 2 diastolic dysfunction.  04/2015.   Chronic kidney disease    kidney function low   Diabetes mellitus    x 5 yrs   DVT of axillary vein, acute left ([place]) 07/24/12   GERD (gastroesophageal reflux disease)    Glaucoma    POAG OU   Hyperlipidemia 05/20/2015   Hypertension    Hypertensive retinopathy    OU   Hypothyroidism    Kidney stones    Macular degeneration    Wet OD, Dry OS   Mixed hyperlipidemia    Peripheral venous insufficiency    Pinched nerve    right elbow   Sigmoid diverticulitis    Vertigo    chonic   Past Surgical History:  Procedure Laterality Date   ABDOMINAL HYSTERECTOMY     BACK SURGERY     spinal    CARDIAC CATHETERIZATION N/A 05/26/2015   Procedure: Left Heart Cath and Coronary Angiography;  Surgeon: Jettie Booze, MD;  Location: [facility];  Service: Cardiovascular;  Laterality: N/A;   CATARACT EXTRACTION Bilateral    CHOLECYSTECTOMY     COLON SURGERY     COLONOSCOPY N/A 12/27/2013   Procedure: COLONOSCOPY;  Surgeon: Rogene Houston, MD;  [facility];  Service: Endoscopy;  Laterality: N/A;  200   COLOSTOMY CLOSURE     ESOPHAGOGASTRODUODENOSCOPY N/A 05/07/2014   Procedure: ESOPHAGOGASTRODUODENOSCOPY (EGD);  Surgeon: Rogene Houston, MD;  [facility];  Service: Endoscopy;  Laterality: N/A;   EYE SURGERY Bilateral    Cat Sx   fracture left foot     HERNIA REPAIR     NM MYOCAR PERF WALL MOTION  01/28/2009   Normal   OTHER SURGICAL HISTORY     colostomy, colostomy reversal, for diverticulitis surgical hernia  repair, arm surgery, neck surgery   US ECHOCARDIOGRAPHY  02/11/2010   Mild MR,trace TR & AI    FAMILY HISTORY Family History  Problem Relation Age of Onset   Other Mother 22       Cause unknown   Cancer Mother        liver   CVA Maternal Grandmother 90       deceased   Heart disease Maternal Grandmother    Stroke Maternal Grandmother    Heart  attack Brother 35       deceased   Cancer Sister 32       deceased   Glaucoma Maternal Uncle     SOCIAL HISTORY Social History   Tobacco Use   Smoking status: Former Smoker    Types: Cigarettes    Quit date: 12/10/2013    Years since quitting: 6.0   Smokeless tobacco: Never Used   Tobacco comment: Smoke 1-1 1/2 packs a day  Substance Use Topics   Alcohol use: No   Drug use: No         OPHTHALMIC EXAM:  Base Eye Exam    Visual Acuity (Snellen - Linear)      Right Left   Dist [place] 20/30 20/30   Dist ph [place] 20/25 -2 20/25 -2       Tonometry (Tonopen, 2:25 PM)      Right Left   Pressure 12 13       Pupils      Dark Light Shape React APD   Right 2 1 Round Minimal 0   Left 2 1 Round Minimal 0       Visual Fields (Counting fingers)      Left Right    Full Full       Extraocular Movement      Right Left    Full, Ortho Full, Ortho       Neuro/Psych    Oriented x3: Yes   Mood/Affect: Normal       Dilation    Both eyes: 1.0% Mydriacyl, 2.5% Phenylephrine @ 2:26 PM        Slit Lamp and Fundus Exam    Slit Lamp Exam      Right Left   Lids/Lashes Dermatochalasis - upper lid, Meibomian gland dysfunction Dermatochalasis - upper lid, Telangiectasia, mild Meibomian gland dysfunction   Conjunctiva/Sclera White and quiet White and quiet   Cornea Trace Punctate epithelial erosions, no epi defect 1+ inferior Punctate epithelial erosions   Anterior Chamber Deep and quiet, narrow temporal angle Deep and quiet, narrow temporal angle   Iris Round and moderately dilated to 5.39mm Round and moderately dilated  to 5.66mm   Lens Posterior chamber intraocular lens Posterior chamber intraocular lens   Vitreous Vitreous syneresis Vitreous syneresis       Fundus Exam      Right Left   Disc Sharp rim, Pallor, +cupping, mild, temporal Peripapillary atrophy Pink and Sharp, temporal Peripapillary atrophy   C/D Ratio 0.75 0.6   Macula Blunted foveal reflex, +focal CNV/PED nasal macula with stable improvement in SRF, persistent IRF/cystic changes, Drusen, no heme Flat, Blunted foveal reflex, drusen, Retinal pigment epithelial mottling, No heme or edema   Vessels Vascular attenuation, Tortuous Vascular attenuation, Tortuous   Periphery Attached; no heme Attached; no heme          IMAGING AND PROCEDURES  Imaging and Procedures for @TODAY @  OCT, Retina - OU - Both Eyes       Right Eye Quality was good. Central Foveal Thickness: 202. Progression has been stable. Findings include intraretinal hyper-reflective material, pigment epithelial detachment, subretinal hyper-reflective material, retinal drusen , normal foveal contour, outer retinal atrophy, no IRF, no SRF (stable IRF/cystic changes overlying PED nasal macula).   Left Eye Quality was good. Central Foveal Thickness: 213. Progression has been stable. Findings include normal foveal contour, no IRF, no SRF, retinal drusen .   Notes *Images captured and stored on drive  Diagnosis / Impression:  OD: exudative ARMD;  persistent IRF/cystic changes overlying PED nasal macula OS: NFP, no IRF/SRF; +drusen -- nonexudative ARMD  Clinical management:  See below  Abbreviations: NFP - Normal foveal profile. CME - cystoid macular edema. PED - pigment epithelial detachment. IRF - intraretinal fluid. SRF - subretinal fluid. EZ - ellipsoid zone. ERM - epiretinal membrane. ORA - outer retinal atrophy. ORT - outer retinal tubulation. SRHM - subretinal hyper-reflective material        Intravitreal Injection, Pharmacologic Agent - OD - Right Eye       Time  Out 12/21/2019. 3:40 PM. Confirmed correct patient, procedure, site, and patient consented.   Anesthesia Topical anesthesia was used. Anesthetic medications included Lidocaine 2%, Proparacaine 0.5%.   Procedure Preparation included 5% betadine to ocular surface, eyelid speculum. A supplied needle was used.   Injection:  1.25 mg Bevacizumab (AVASTIN) SOLN   NDC: 10626-948-54, Lot: [id number] , Expiration date: 02/27/2020   Route: Intravitreal, Site: Right Eye, Waste: 0 mL  Post-op Post injection exam found visual acuity of at least counting fingers. The patient tolerated the procedure well. There were no complications. The patient received written and verbal post procedure care education.                 ASSESSMENT/PLAN:    ICD-10-CM   1. Exudative age-related macular degeneration of right eye with active choroidal neovascularization (HCC)  H35.3211 Intravitreal Injection, Pharmacologic Agent - OD - Right Eye    Bevacizumab (AVASTIN) SOLN 1.25 mg  2. Retinal edema  H35.81 OCT, Retina - OU - Both Eyes  3. Intermediate stage nonexudative age-related macular degeneration of left eye  H35.3122   4. Diabetes mellitus type 2 without retinopathy ([place])  E11.9   5. Essential hypertension  I10   6. Hypertensive retinopathy of both eyes  H35.033   7. Pseudophakia of both eyes  Z96.1   8. Primary open angle glaucoma of both eyes, unspecified glaucoma stage  H40.1130    1,2. 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, OD    - delayed follow up from 4 weeks to 8 weeks due to passing of husband (12.11.20-02.12.21)  - s/p IVA OD #1 (12.11.20), #2 (02.12.21), #3 (03.12.21), #4 (04.09.21), #5 (05.14.21)  - FA 12.11.20 confirms +CNVM  - OCT today shows persistent IRF overlying nasal PED  - BCVA OD improved to 20/25-2 from 20/30 at last visit  - recommend IVA OD #6 today, 06.17.21   - pt wishes to proceed with injection  - RBA of procedure discussed, questions answered  - informed consent  obtained and signed  - see procedure note  - Avastin informed consent form signed and scanned on 12.11.2020 (OD)  - of note, pt presented acutely on 03.16.21 due to decreased vision after injection OD  - exam showed central PEE and focal SPK -- ?closed/healed epi defect OD -- remains resolved today  - f/u in 5 wks -- DFE/OCT/possible injection  3. Age related macular degeneration, non-exudative, OS  - intermediate stage  - The incidence, anatomy, and pathology of dry AMD, risk of progression, and the AREDS and AREDS 2 study including smoking risks discussed with patient.  - Recommend amsler grid monitoring  4. Diabetes mellitus, type 2 without retinopathy  - The incidence, risk factors for progression, natural history and treatment options for diabetic retinopathy  were discussed with patient.    - The need for close monitoring of blood glucose, blood pressure, and serum lipids, avoiding cigarette or any type of tobacco, and the need for long term follow  up was also discussed with patient.  - monitor  5,6. Hypertensive retinopathy OU  - discussed importance of tight BP control  - monitor  7. Pseudophakia OU  - s/p CE/IOL (Dr. Venetia Maxon)  - beautiful surgery, doing well  - monitor  8. POAG OU  - formerly managed by Dr. Venetia Maxon  - s/p laser w/ Dr. Venetia Maxon -- ?SLT  - IOP 12,13 OU  - currently on latanoprost QHS  - monitor   Ophthalmic Meds Ordered this visit:  Meds ordered this encounter  Medications   Bevacizumab (AVASTIN) SOLN 1.25 mg       Return 5 weeks, for DFE, OCT.  There are no Patient Instructions on file for this visit.   Explained the diagnoses, plan, and follow up with the patient and they expressed understanding.  Patient expressed understanding of the importance of proper follow up care.   This document serves as a record of services personally performed by Gardiner Sleeper, MD, PhD. It was created on their behalf by Roselee Nova, COMT. The creation of  this record is the provider's dictation and/or activities during the visit.  Electronically signed by: Roselee Nova, COMT 12/23/19 1:48 AM  Gardiner Sleeper, M.D., Ph.D. Diseases & Surgery of the Retina and Vitreous [facility]  I have reviewed the above documentation for accuracy and completeness, and I agree with the above. Gardiner Sleeper, M.D., Ph.D. 12/23/19 1:48 AM  Abbreviations: M myopia (nearsighted); A astigmatism; H hyperopia (farsighted); P presbyopia; Mrx spectacle prescription;  CTL contact lenses; OD right eye; OS left eye; OU both eyes  XT exotropia; ET esotropia; PEK punctate epithelial keratitis; PEE punctate epithelial erosions; DES dry eye syndrome; MGD meibomian gland dysfunction; ATs artificial tears; PFAT's preservative free artificial tears; [place] nuclear sclerotic cataract; PSC posterior subcapsular cataract; ERM epi-retinal membrane; PVD posterior vitreous detachment; RD retinal detachment; DM diabetes mellitus; DR diabetic retinopathy; NPDR non-proliferative diabetic retinopathy; PDR proliferative diabetic retinopathy; CSME clinically significant macular edema; DME diabetic macular edema; dbh dot blot hemorrhages; CWS cotton wool spot; POAG primary open angle glaucoma; C/D cup-to-disc ratio; HVF humphrey visual field; GVF goldmann visual field; OCT optical coherence tomography; IOP intraocular pressure; BRVO Branch retinal vein occlusion; CRVO central retinal vein occlusion; CRAO central retinal artery occlusion; BRAO branch retinal artery occlusion; RT retinal tear; SB scleral buckle; PPV pars plana vitrectomy; VH Vitreous hemorrhage; PRP panretinal laser photocoagulation; IVK intravitreal kenalog; VMT vitreomacular traction; MH Macular hole;  NVD neovascularization of the disc; NVE neovascularization elsewhere; AREDS age related eye disease study; ARMD age related macular degeneration; POAG primary open angle glaucoma; EBMD epithelial/anterior basement  membrane dystrophy; ACIOL anterior chamber intraocular lens; IOL intraocular lens; PCIOL posterior chamber intraocular lens; Phaco/IOL phacoemulsification with intraocular lens placement; [place] photorefractive keratectomy; LASIK laser assisted in situ keratomileusis; HTN hypertension; DM diabetes mellitus; COPD chronic obstructive pulmonary disease

## 2019-12-21 ENCOUNTER — Other Ambulatory Visit: Payer: Self-pay

## 2019-12-21 ENCOUNTER — Encounter (INDEPENDENT_AMBULATORY_CARE_PROVIDER_SITE_OTHER): Payer: Self-pay | Admitting: Ophthalmology

## 2019-12-21 ENCOUNTER — Ambulatory Visit (INDEPENDENT_AMBULATORY_CARE_PROVIDER_SITE_OTHER): Payer: Medicare Other | Admitting: Ophthalmology

## 2019-12-21 VITALS — BP 138/72 | HR 64

## 2019-12-21 DIAGNOSIS — I10 Essential (primary) hypertension: Secondary | ICD-10-CM | POA: Diagnosis not present

## 2019-12-21 DIAGNOSIS — H353211 Exudative age-related macular degeneration, right eye, with active choroidal neovascularization: Secondary | ICD-10-CM | POA: Diagnosis not present

## 2019-12-21 DIAGNOSIS — H35033 Hypertensive retinopathy, bilateral: Secondary | ICD-10-CM

## 2019-12-21 DIAGNOSIS — H3581 Retinal edema: Secondary | ICD-10-CM

## 2019-12-21 DIAGNOSIS — Z961 Presence of intraocular lens: Secondary | ICD-10-CM

## 2019-12-21 DIAGNOSIS — H353122 Nonexudative age-related macular degeneration, left eye, intermediate dry stage: Secondary | ICD-10-CM

## 2019-12-21 DIAGNOSIS — E119 Type 2 diabetes mellitus without complications: Secondary | ICD-10-CM

## 2019-12-21 DIAGNOSIS — H401130 Primary open-angle glaucoma, bilateral, stage unspecified: Secondary | ICD-10-CM

## 2019-12-23 ENCOUNTER — Encounter (INDEPENDENT_AMBULATORY_CARE_PROVIDER_SITE_OTHER): Payer: Self-pay | Admitting: Ophthalmology

## 2019-12-23 MED ORDER — BEVACIZUMAB CHEMO INJECTION 1.25MG/0.05ML SYRINGE FOR KALEIDOSCOPE
1.2500 mg | INTRAVITREAL | Status: AC | PRN
  Administered 2019-12-23: 1.25 mg via INTRAVITREAL

## 2019-12-25 ENCOUNTER — Telehealth: Payer: Self-pay | Admitting: Pharmacist Clinician (PhC)/ Clinical Pharmacy Specialist

## 2019-12-25 NOTE — Telephone Encounter (Signed)
Patient called, LM regarding ongoing dizziness.    Returned call to patient.  She notes the dizziness has continued, despite having discontinued hydralazine.  She notes that BP has not changed significantly, although it does sometimes go "up a bit".   This morning she had a dizzy spell while sitting on toilet, had to grab a garbage can to vomit.  Has been taking meclizine almost daily, but no relief.  Advised that she needs to contact her PCP, as this doesn't sound like a medication issue.  Patient voiced understanding.

## 2019-12-26 DIAGNOSIS — E119 Type 2 diabetes mellitus without complications: Secondary | ICD-10-CM | POA: Diagnosis not present

## 2019-12-26 DIAGNOSIS — Z1389 Encounter for screening for other disorder: Secondary | ICD-10-CM | POA: Diagnosis not present

## 2019-12-26 DIAGNOSIS — Z7901 Long term (current) use of anticoagulants: Secondary | ICD-10-CM | POA: Diagnosis not present

## 2019-12-26 DIAGNOSIS — H8303 Labyrinthitis, bilateral: Secondary | ICD-10-CM | POA: Diagnosis not present

## 2019-12-26 DIAGNOSIS — R42 Dizziness and giddiness: Secondary | ICD-10-CM | POA: Diagnosis not present

## 2020-01-01 ENCOUNTER — Other Ambulatory Visit: Payer: Self-pay | Admitting: Cardiovascular Disease

## 2020-01-01 MED ORDER — NEXLIZET 180-10 MG PO TABS: 1.0000 | ORAL_TABLET | Freq: Every day | ORAL | 11 refills | Status: DC

## 2020-01-01 NOTE — Telephone Encounter (Signed)
°  Prescription was sent to wrong pharmacy and patient is completely out  *STAT* If patient is at the pharmacy, call can be transferred to refill team.   1. Which medications need to be refilled? (please list name of each medication and dose if known)   Bempedoic Acid-Ezetimibe (NEXLIZET) 180-10 MG TABS  2. Which pharmacy/location (including street and city if local pharmacy) is medication to be sent to?   [facility] - [place], [place] - [place] [address]  3. Do they need a 30 day or 90 day supply? [place]

## 2020-01-02 DIAGNOSIS — E1165 Type 2 diabetes mellitus with hyperglycemia: Secondary | ICD-10-CM | POA: Diagnosis not present

## 2020-01-02 DIAGNOSIS — I10 Essential (primary) hypertension: Secondary | ICD-10-CM | POA: Diagnosis not present

## 2020-01-02 DIAGNOSIS — I2510 Atherosclerotic heart disease of native coronary artery without angina pectoris: Secondary | ICD-10-CM | POA: Diagnosis not present

## 2020-01-02 DIAGNOSIS — H353211 Exudative age-related macular degeneration, right eye, with active choroidal neovascularization: Secondary | ICD-10-CM | POA: Diagnosis not present

## 2020-01-09 DIAGNOSIS — E119 Type 2 diabetes mellitus without complications: Secondary | ICD-10-CM | POA: Diagnosis not present

## 2020-01-09 DIAGNOSIS — I82402 Acute embolism and thrombosis of unspecified deep veins of left lower extremity: Secondary | ICD-10-CM | POA: Diagnosis not present

## 2020-01-09 DIAGNOSIS — Z0000 Encounter for general adult medical examination without abnormal findings: Secondary | ICD-10-CM | POA: Diagnosis not present

## 2020-01-09 DIAGNOSIS — E7849 Other hyperlipidemia: Secondary | ICD-10-CM | POA: Diagnosis not present

## 2020-01-09 DIAGNOSIS — H353122 Nonexudative age-related macular degeneration, left eye, intermediate dry stage: Secondary | ICD-10-CM | POA: Diagnosis not present

## 2020-01-09 DIAGNOSIS — Z1389 Encounter for screening for other disorder: Secondary | ICD-10-CM | POA: Diagnosis not present

## 2020-01-09 DIAGNOSIS — I700 Atherosclerosis of aorta: Secondary | ICD-10-CM | POA: Diagnosis not present

## 2020-01-22 NOTE — Progress Notes (Signed)
[facility] Note  01/25/2020     CHIEF COMPLAINT Patient presents for Retina Follow Up   HISTORY OF PRESENT ILLNESS: [name] is a 79 y.o. female who presents to the clinic today for:   HPI    Retina Follow Up    Patient presents with  Wet AMD.  In right eye.  This started 5 weeks ago.  Severity is moderate.  I, the attending physician,  performed the HPI with the patient and updated documentation appropriately.          Comments    Patient here for 5 weeks retina follow up for exu ARMD OD. Patient states vision seems like seeing double sometimes. Happens when reading then looks up. No eye pain. Was dizzy last time here. Not as bad today.       Last edited by Bernarda Caffey, MD on 01/25/2020 11:49 PM. (History)    Patient states has occasional double vision, but very slight. Didn't notice until after cataract surgery.   Referring physician: Sharilyn Sites, MD [address] [place],  [place] [address]  HISTORICAL INFORMATION:   Selected notes from the MEDICAL RECORD NUMBER Referred by Dr. Madelin Headings for concern of SRF OD LEE: 11.27.20 (M. Cotter) [BCVA: OD: 20/80-- OS: 20/60-]  Ocular Hx-glaucoma (latanoprost)  PMH-DM    CURRENT MEDICATIONS: Current Outpatient Medications (Ophthalmic Drugs)  Medication Sig  . latanoprost (XALATAN) 0.005 % ophthalmic solution Place 1 drop into both eyes at bedtime.   [name] tetrahydrozoline 0.05 % ophthalmic solution Place 1 drop into both eyes at bedtime. (Patient not taking: Reported on 12/21/2019)   No current facility-administered medications for this visit. (Ophthalmic Drugs)   Current Outpatient Medications (Other)  Medication Sig  . albuterol (PROVENTIL HFA;VENTOLIN HFA) 108 (90 BASE) MCG/ACT inhaler Inhale 2 puffs into the lungs every 4 (four) hours as needed for shortness of breath.  [name] amitriptyline (ELAVIL) 25 MG tablet Take 50 mg by mouth at bedtime.   . Bempedoic Acid-Ezetimibe (NEXLIZET)  180-10 MG TABS Take 1 tablet by mouth daily.  . Black Cohosh 40 MG CAPS Take 40 mg by mouth 2 (two) times daily.  . carvedilol (COREG) 6.25 MG tablet TAKE 1 TABLET BY MOUTH  TWICE DAILY  . cetirizine (ZYRTEC) 10 MG tablet Take 10 mg by mouth daily.  . Cholecalciferol (VITAMIN D-3) 1000 units CAPS Take 1 capsule by mouth daily.  [name] esomeprazole (NEXIUM) 20 MG capsule Take 20 mg by mouth daily at 12 noon.  . furosemide (LASIX) 40 MG tablet TAKE 1 TABLET (40 MG TOTAL) BY MOUTH DAILY. MAY TAKE EXTRA DAILY AS NEEDED FOR SWELLING  . gabapentin (NEURONTIN) 300 MG capsule Take 300 mg by mouth 3 (three) times daily.  [name] glimepiride (AMARYL) 2 MG tablet Take 2 mg by mouth daily.  . hydrALAZINE (APRESOLINE) 25 MG tablet Take 1 tablet (25 mg total) by mouth in the morning and at bedtime.  . isosorbide mononitrate (IMDUR) 30 MG 24 hr tablet TAKE 1 TABLET BY MOUTH  DAILY  . meclizine (ANTIVERT) 25 MG tablet Take 25 mg by mouth as needed for dizziness.   . Omega-3 Fatty Acids (FISH OIL) 1200 MG CAPS Take 1 capsule by mouth 2 (two) times daily.  . polyethylene glycol (MIRALAX / GLYCOLAX) packet Take 17 g by mouth daily.  . potassium chloride SA (KLOR-CON) 20 MEQ tablet TAKE ONE-HALF TABLET BY  MOUTH DAILY  . sitaGLIPtin (JANUVIA) 50 MG tablet Take 1 tablet by mouth  daily.  . vitamin B-12 (CYANOCOBALAMIN) 100 MCG tablet Take 1 tablet by mouth daily.  [name] warfarin (COUMADIN) 1 MG tablet Take 1 mg by mouth daily.   No current facility-administered medications for this visit. (Other)      REVIEW OF SYSTEMS: ROS    Positive for: Genitourinary, Endocrine, Cardiovascular, Eyes   Negative for: Constitutional, Gastrointestinal, Neurological, Skin, Musculoskeletal, HENT, Respiratory, Psychiatric, Allergic/Imm, Heme/Lymph   Last edited by Theodore Demark, COA on 01/25/2020  2:25 PM. (History)       ALLERGIES Allergies  Allergen Reactions  . Vioxx [Rofecoxib] Shortness Of Breath  . Metformin And Related      Kidney failure  . Penicillins     rash  . Pravastatin   . Codeine Rash  . Motrin [Ibuprofen] Rash  . Sulfur Rash    PAST MEDICAL HISTORY Past Medical History:  Diagnosis Date  . Antral gastritis    EGD 11/15  . Asthmatic bronchitis   . Back pain   . Chronic diastolic heart failure (HCC) 05/20/2015   Grade 2 diastolic dysfunction.  04/2015.  [name] Chronic kidney disease    kidney function low  . Diabetes mellitus    x 5 yrs  . DVT of axillary vein, acute left ([place]) 07/24/12  . GERD (gastroesophageal reflux disease)   . Glaucoma    POAG OU  . Hyperlipidemia 05/20/2015  . Hypertension   . Hypertensive retinopathy    OU  . Hypothyroidism   . Kidney stones   . Macular degeneration    Wet OD, Dry OS  . Mixed hyperlipidemia   . Peripheral venous insufficiency   . Pinched nerve    right elbow  . Sigmoid diverticulitis   . Vertigo    chonic   Past Surgical History:  Procedure Laterality Date  . ABDOMINAL HYSTERECTOMY    . BACK SURGERY     spinal   . CARDIAC CATHETERIZATION N/A 05/26/2015   Procedure: Left Heart Cath and Coronary Angiography;  Surgeon: Jettie Booze, MD;  Location: [facility];  Service: Cardiovascular;  Laterality: N/A;  . CATARACT EXTRACTION Bilateral   . CHOLECYSTECTOMY    . COLON SURGERY    . COLONOSCOPY N/A 12/27/2013   Procedure: COLONOSCOPY;  Surgeon: Rogene Houston, MD;  [facility]: [facility];  Service: Endoscopy;  Laterality: N/A;  200  . COLOSTOMY CLOSURE    . ESOPHAGOGASTRODUODENOSCOPY N/A 05/07/2014   Procedure: ESOPHAGOGASTRODUODENOSCOPY (EGD);  Surgeon: Rogene Houston, MD;  Location: [facility];  Service: Endoscopy;  Laterality: N/A;  . EYE SURGERY Bilateral    Cat Sx  . fracture left foot    . HERNIA REPAIR    . NM MYOCAR PERF WALL MOTION  01/28/2009   Normal  . OTHER SURGICAL HISTORY     colostomy, colostomy reversal, for diverticulitis surgical hernia repair, arm surgery, neck surgery  . US ECHOCARDIOGRAPHY   02/11/2010   Mild MR,trace TR & AI    FAMILY HISTORY Family History  Problem Relation Age of Onset  . Other Mother 41       Cause unknown  . Cancer Mother        liver  . CVA Maternal Grandmother 90       deceased  . Heart disease Maternal Grandmother   . Stroke Maternal Grandmother   . Heart attack Brother 45       deceased  . Cancer Sister 29       deceased  . Glaucoma Maternal  Uncle     SOCIAL HISTORY Social History   Tobacco Use  . Smoking status: Former Smoker    Types: Cigarettes    Quit date: 12/10/2013    Years since quitting: 6.1  . Smokeless tobacco: Never Used  . Tobacco comment: Smoke 1-1 1/2 packs a day  Substance Use Topics  . Alcohol use: No  . Drug use: No         OPHTHALMIC EXAM:  Base Eye Exam    Visual Acuity (Snellen - Linear)      Right Left   Dist [place] 20/40 +2 20/30 -1   Dist ph [place] 20/25 -2 NI       Tonometry (Tonopen, 2:21 PM)      Right Left   Pressure 17 13       Pupils      Dark Light Shape React APD   Right 2 1 Round Brisk None   Left 2 1 Round Brisk None       Visual Fields (Counting fingers)      Left Right    Full Full       Extraocular Movement      Right Left    Full, Ortho Full, Ortho       Neuro/Psych    Oriented x3: Yes   Mood/Affect: Normal       Dilation    Both eyes: 1.0% Mydriacyl, 2.5% Phenylephrine @ 2:21 PM        Slit Lamp and Fundus Exam    Slit Lamp Exam      Right Left   Lids/Lashes Dermatochalasis - upper lid, Meibomian gland dysfunction Dermatochalasis - upper lid, Telangiectasia, mild Meibomian gland dysfunction   Conjunctiva/Sclera White and quiet White and quiet   Cornea Trace Punctate epithelial erosions, no epi defect 1+ inferior Punctate epithelial erosions   Anterior Chamber Deep and quiet, narrow temporal angle Deep and quiet, narrow temporal angle   Iris Round and moderately dilated to 5.13mm Round and moderately dilated to 5.78mm   Lens Posterior chamber intraocular lens  Posterior chamber intraocular lens   Vitreous Vitreous syneresis Vitreous syneresis       Fundus Exam      Right Left   Disc Sharp rim, Pallor, +cupping, mild, temporal Peripapillary atrophy Pink and Sharp, temporal Peripapillary atrophy   C/D Ratio 0.75 0.6   Macula Blunted foveal reflex, +focal CNV/PED nasal macula with stable improvement in SRF, interval improvement in IRF/cystic changes, Drusen, no heme Flat, Blunted foveal reflex, drusen, Retinal pigment epithelial mottling, No heme or edema   Vessels Vascular attenuation, Tortuous Vascular attenuation, Tortuous   Periphery Attached; no heme Attached; no heme          IMAGING AND PROCEDURES  Imaging and Procedures for @TODAY @  OCT, Retina - OU - Both Eyes       Right Eye Quality was good. Central Foveal Thickness: 203. Progression has improved. Findings include intraretinal hyper-reflective material, pigment epithelial detachment, subretinal hyper-reflective material, retinal drusen , normal foveal contour, outer retinal atrophy, no IRF, no SRF (Interval improvement in IRF/cystic changes overlying PED nasal macula).   Left Eye Quality was good. Central Foveal Thickness: 214. Progression has been stable. Findings include normal foveal contour, no IRF, no SRF, retinal drusen .   Notes *Images captured and stored on drive  Diagnosis / Impression:  OD: exudative ARMD; interval improvement in IRF/cystic changes overlying PED nasal macula OS: NFP, no IRF/SRF; +drusen -- nonexudative ARMD  Clinical management:  See  below  Abbreviations: NFP - Normal foveal profile. CME - cystoid macular edema. PED - pigment epithelial detachment. IRF - intraretinal fluid. SRF - subretinal fluid. EZ - ellipsoid zone. ERM - epiretinal membrane. ORA - outer retinal atrophy. ORT - outer retinal tubulation. SRHM - subretinal hyper-reflective material        Intravitreal Injection, Pharmacologic Agent - OD - Right Eye       Time  Out 01/25/2020. 3:16 PM. Confirmed correct patient, procedure, site, and patient consented.   Anesthesia Topical anesthesia was used. Anesthetic medications included Lidocaine 2%, Proparacaine 0.5%.   Procedure Preparation included 5% betadine to ocular surface, eyelid speculum. A supplied needle was used.   Injection:  1.25 mg Bevacizumab (AVASTIN) SOLN   NDC: 32951-884-16, Lot: [id number] , Expiration date: 03/05/2020   Route: Intravitreal, Site: Right Eye, Waste: 0 mL  Post-op Post injection exam found visual acuity of at least counting fingers. The patient tolerated the procedure well. There were no complications. The patient received written and verbal post procedure care education.                 ASSESSMENT/PLAN:    ICD-10-CM   1. Exudative age-related macular degeneration of right eye with active choroidal neovascularization (HCC)  H35.3211 Intravitreal Injection, Pharmacologic Agent - OD - Right Eye    Bevacizumab (AVASTIN) SOLN 1.25 mg  2. Retinal edema  H35.81 OCT, Retina - OU - Both Eyes  3. Intermediate stage nonexudative age-related macular degeneration of left eye  H35.3122   4. Diabetes mellitus type 2 without retinopathy ([place])  E11.9   5. Essential hypertension  I10   6. Hypertensive retinopathy of both eyes  H35.033   7. Pseudophakia of both eyes  Z96.1   8. Primary open angle glaucoma of both eyes, unspecified glaucoma stage  H40.1130    1,2. 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, OD    - delayed follow up from 4 weeks to 8 weeks due to passing of husband (12.11.20-02.12.21)  - s/p IVA OD #1 (12.11.20), #2 (02.12.21), #3 (03.12.21), #4 (04.09.21), #5 (05.14.21), #6 (06.17.21)  - FA 12.11.20 confirms +CNVM  - OCT today shows interval improvement in IRF overlying nasal PED  - BCVA OD stable at 20/25-2  - recommend IVA OD #7 today, 07.23.21 w/ extension to 6 wks  - pt wishes to proceed with injection  - RBA of procedure discussed, questions answered  -  informed consent obtained and signed  - see procedure note  - Avastin informed consent form signed and scanned on 12.11.2020 (OD)  - of note, pt presented acutely on 03.16.21 due to decreased vision after injection OD  - exam showed central PEE and focal SPK -- ?closed/healed epi defect OD -- remains resolved today  - f/u in 6 wks -- DFE/OCT/possible injection; treat and extend as able  3. Age related macular degeneration, non-exudative, OS  - intermediate stage  - The incidence, anatomy, and pathology of dry AMD, risk of progression, and the AREDS and AREDS 2 study including smoking risks discussed with patient.  - Recommend amsler grid monitoring  4. Diabetes mellitus, type 2 without retinopathy  - The incidence, risk factors for progression, natural history and treatment options for diabetic retinopathy  were discussed with patient.    - The need for close monitoring of blood glucose, blood pressure, and serum lipids, avoiding cigarette or any type of tobacco, and the need for long term follow up was also discussed with patient.  - monitor  5,6. Hypertensive  retinopathy OU  - discussed importance of tight BP control  - monitor  7. Pseudophakia OU  - s/p CE/IOL (Dr. Venetia Maxon)  - beautiful surgery, doing well  - monitor  8. POAG OU  - formerly managed by Dr. Venetia Maxon  - s/p laser w/ Dr. Venetia Maxon -- ?SLT  - IOP 12,13 OU  - currently on latanoprost QHS  - monitor   Ophthalmic Meds Ordered this visit:  Meds ordered this encounter  Medications  . Bevacizumab (AVASTIN) SOLN 1.25 mg       Return in about 6 weeks (around 03/07/2020) for DFE, OCT.  There are no Patient Instructions on file for this visit.   Explained the diagnoses, plan, and follow up with the patient and they expressed understanding.  Patient expressed understanding of the importance of proper follow up care.   This document serves as a record of services personally performed by Gardiner Sleeper, MD, PhD. It  was created on their behalf by San Jetty. Owens Shark, OA an ophthalmic technician. The creation of this record is the provider's dictation and/or activities during the visit.    Electronically signed by: San Jetty. Owens Shark, [place] 07.20.2021 11:56 PM   This document serves as a record of services personally performed by Gardiner Sleeper, MD, PhD. It was created on their behalf by Roselee Nova, COMT. The creation of this record is the provider's dictation and/or activities during the visit.  Electronically signed by: Roselee Nova, COMT 01/25/20 11:56 PM  Gardiner Sleeper, M.D., Ph.D. Diseases & Surgery of the Retina and Vitreous [facility]  I have reviewed the above documentation for accuracy and completeness, and I agree with the above. Gardiner Sleeper, M.D., Ph.D. 01/25/20 11:56 PM   Abbreviations: M myopia (nearsighted); A astigmatism; H hyperopia (farsighted); P presbyopia; Mrx spectacle prescription;  CTL contact lenses; OD right eye; OS left eye; OU both eyes  XT exotropia; ET esotropia; PEK punctate epithelial keratitis; PEE punctate epithelial erosions; DES dry eye syndrome; MGD meibomian gland dysfunction; ATs artificial tears; PFAT's preservative free artificial tears; [place] nuclear sclerotic cataract; PSC posterior subcapsular cataract; ERM epi-retinal membrane; PVD posterior vitreous detachment; RD retinal detachment; DM diabetes mellitus; DR diabetic retinopathy; NPDR non-proliferative diabetic retinopathy; PDR proliferative diabetic retinopathy; CSME clinically significant macular edema; DME diabetic macular edema; dbh dot blot hemorrhages; CWS cotton wool spot; POAG primary open angle glaucoma; C/D cup-to-disc ratio; HVF humphrey visual field; GVF goldmann visual field; OCT optical coherence tomography; IOP intraocular pressure; BRVO Branch retinal vein occlusion; CRVO central retinal vein occlusion; CRAO central retinal artery occlusion; BRAO branch retinal artery occlusion; RT  retinal tear; SB scleral buckle; PPV pars plana vitrectomy; VH Vitreous hemorrhage; PRP panretinal laser photocoagulation; IVK intravitreal kenalog; VMT vitreomacular traction; MH Macular hole;  NVD neovascularization of the disc; NVE neovascularization elsewhere; AREDS age related eye disease study; ARMD age related macular degeneration; POAG primary open angle glaucoma; EBMD epithelial/anterior basement membrane dystrophy; ACIOL anterior chamber intraocular lens; IOL intraocular lens; PCIOL posterior chamber intraocular lens; Phaco/IOL phacoemulsification with intraocular lens placement; [place] photorefractive keratectomy; LASIK laser assisted in situ keratomileusis; HTN hypertension; DM diabetes mellitus; COPD chronic obstructive pulmonary disease

## 2020-01-25 ENCOUNTER — Ambulatory Visit (INDEPENDENT_AMBULATORY_CARE_PROVIDER_SITE_OTHER): Payer: Medicare Other | Admitting: Ophthalmology

## 2020-01-25 ENCOUNTER — Encounter (INDEPENDENT_AMBULATORY_CARE_PROVIDER_SITE_OTHER): Payer: Self-pay | Admitting: Ophthalmology

## 2020-01-25 ENCOUNTER — Other Ambulatory Visit: Payer: Self-pay

## 2020-01-25 DIAGNOSIS — E119 Type 2 diabetes mellitus without complications: Secondary | ICD-10-CM

## 2020-01-25 DIAGNOSIS — H353122 Nonexudative age-related macular degeneration, left eye, intermediate dry stage: Secondary | ICD-10-CM | POA: Diagnosis not present

## 2020-01-25 DIAGNOSIS — I10 Essential (primary) hypertension: Secondary | ICD-10-CM

## 2020-01-25 DIAGNOSIS — H3581 Retinal edema: Secondary | ICD-10-CM | POA: Diagnosis not present

## 2020-01-25 DIAGNOSIS — H35033 Hypertensive retinopathy, bilateral: Secondary | ICD-10-CM

## 2020-01-25 DIAGNOSIS — H401130 Primary open-angle glaucoma, bilateral, stage unspecified: Secondary | ICD-10-CM

## 2020-01-25 DIAGNOSIS — Z961 Presence of intraocular lens: Secondary | ICD-10-CM

## 2020-01-25 DIAGNOSIS — H353211 Exudative age-related macular degeneration, right eye, with active choroidal neovascularization: Secondary | ICD-10-CM | POA: Diagnosis not present

## 2020-01-25 MED ORDER — BEVACIZUMAB CHEMO INJECTION 1.25MG/0.05ML SYRINGE FOR KALEIDOSCOPE
1.2500 mg | INTRAVITREAL | Status: AC | PRN
  Administered 2020-01-25: 1.25 mg via INTRAVITREAL

## 2020-02-01 DIAGNOSIS — I10 Essential (primary) hypertension: Secondary | ICD-10-CM | POA: Diagnosis not present

## 2020-02-01 DIAGNOSIS — E1165 Type 2 diabetes mellitus with hyperglycemia: Secondary | ICD-10-CM | POA: Diagnosis not present

## 2020-02-01 DIAGNOSIS — I2510 Atherosclerotic heart disease of native coronary artery without angina pectoris: Secondary | ICD-10-CM | POA: Diagnosis not present

## 2020-02-01 DIAGNOSIS — H353211 Exudative age-related macular degeneration, right eye, with active choroidal neovascularization: Secondary | ICD-10-CM | POA: Diagnosis not present

## 2020-02-07 DIAGNOSIS — R944 Abnormal results of kidney function studies: Secondary | ICD-10-CM | POA: Diagnosis not present

## 2020-02-07 DIAGNOSIS — Z7901 Long term (current) use of anticoagulants: Secondary | ICD-10-CM | POA: Diagnosis not present

## 2020-03-04 DIAGNOSIS — I2510 Atherosclerotic heart disease of native coronary artery without angina pectoris: Secondary | ICD-10-CM | POA: Diagnosis not present

## 2020-03-04 DIAGNOSIS — H353211 Exudative age-related macular degeneration, right eye, with active choroidal neovascularization: Secondary | ICD-10-CM | POA: Diagnosis not present

## 2020-03-04 DIAGNOSIS — E1165 Type 2 diabetes mellitus with hyperglycemia: Secondary | ICD-10-CM | POA: Diagnosis not present

## 2020-03-04 DIAGNOSIS — I10 Essential (primary) hypertension: Secondary | ICD-10-CM | POA: Diagnosis not present

## 2020-03-05 NOTE — Progress Notes (Signed)
[facility] Note  03/07/2020     CHIEF COMPLAINT Patient presents for Retina Follow Up   HISTORY OF PRESENT ILLNESS: [name] is a 79 y.o. female who presents to the clinic today for:   HPI    Retina Follow Up    Patient presents with  Other.  In both eyes.  This started 6 weeks ago.  Severity is moderate.  I, the attending physician,  performed the HPI with the patient and updated documentation appropriately.          Comments    Patient here for 6 weeks retina follow up for DFE OU. Patient states vision some days are better than other days. Can tell when it gets closer to appointment vision gets worse. No eye pain.        Last edited by Bernarda Caffey, MD on 03/07/2020  5:05 PM. (History)    Patient   Referring physician: Madelin Headings, DO [address] [place],  [place] [address]  HISTORICAL INFORMATION:   Selected notes from the MEDICAL RECORD NUMBER Referred by Dr. Madelin Headings for concern of SRF OD LEE: 11.27.20 (M. Cotter) [BCVA: OD: 20/80-- OS: 20/60-]  Ocular Hx-glaucoma (latanoprost)  PMH-DM    CURRENT MEDICATIONS: Current Outpatient Medications (Ophthalmic Drugs)  Medication Sig   dorzolamide-timolol (COSOPT) 22.3-6.8 MG/ML ophthalmic solution Place 1 drop into the right eye 2 (two) times daily.   latanoprost (XALATAN) 0.005 % ophthalmic solution Place 1 drop into both eyes at bedtime.    tetrahydrozoline 0.05 % ophthalmic solution Place 1 drop into both eyes at bedtime. (Patient not taking: Reported on 12/21/2019)   No current facility-administered medications for this visit. (Ophthalmic Drugs)   Current Outpatient Medications (Other)  Medication Sig   albuterol (PROVENTIL HFA;VENTOLIN HFA) 108 (90 BASE) MCG/ACT inhaler Inhale 2 puffs into the lungs every 4 (four) hours as needed for shortness of breath.   amitriptyline (ELAVIL) 25 MG tablet Take 50 mg by mouth at bedtime.    Bempedoic Acid-Ezetimibe (NEXLIZET) 180-10  MG TABS Take 1 tablet by mouth daily.   Black Cohosh 40 MG CAPS Take 40 mg by mouth 2 (two) times daily.   carvedilol (COREG) 6.25 MG tablet TAKE 1 TABLET BY MOUTH  TWICE DAILY   cetirizine (ZYRTEC) 10 MG tablet Take 10 mg by mouth daily.   Cholecalciferol (VITAMIN D-3) 1000 units CAPS Take 1 capsule by mouth daily.   esomeprazole (NEXIUM) 20 MG capsule Take 20 mg by mouth daily at 12 noon.   furosemide (LASIX) 40 MG tablet TAKE 1 TABLET (40 MG TOTAL) BY MOUTH DAILY. MAY TAKE EXTRA DAILY AS NEEDED FOR SWELLING   gabapentin (NEURONTIN) 300 MG capsule Take 300 mg by mouth 3 (three) times daily.   glimepiride (AMARYL) 2 MG tablet Take 2 mg by mouth daily.   hydrALAZINE (APRESOLINE) 25 MG tablet Take 1 tablet (25 mg total) by mouth in the morning and at bedtime.   isosorbide mononitrate (IMDUR) 30 MG 24 hr tablet TAKE 1 TABLET BY MOUTH  DAILY   meclizine (ANTIVERT) 25 MG tablet Take 25 mg by mouth as needed for dizziness.    Omega-3 Fatty Acids (FISH OIL) 1200 MG CAPS Take 1 capsule by mouth 2 (two) times daily.   polyethylene glycol (MIRALAX / GLYCOLAX) packet Take 17 g by mouth daily.   potassium chloride SA (KLOR-CON) 20 MEQ tablet TAKE ONE-HALF TABLET BY  MOUTH DAILY   sitaGLIPtin (JANUVIA) 50 MG tablet Take 1  tablet by mouth daily.   vitamin B-12 (CYANOCOBALAMIN) 100 MCG tablet Take 1 tablet by mouth daily.   warfarin (COUMADIN) 1 MG tablet Take 1 mg by mouth daily.   No current facility-administered medications for this visit. (Other)      REVIEW OF SYSTEMS: ROS    Positive for: Genitourinary, Endocrine, Cardiovascular, Eyes   Negative for: Constitutional, Gastrointestinal, Neurological, Skin, Musculoskeletal, HENT, Respiratory, Psychiatric, Allergic/Imm, Heme/Lymph   Last edited by Theodore Demark, COA on 03/07/2020  2:20 PM. (History)       ALLERGIES Allergies  Allergen Reactions   Vioxx [Rofecoxib] Shortness Of Breath   Metformin And Related     Kidney  failure   Penicillins     rash   Pravastatin    Codeine Rash   Motrin [Ibuprofen] Rash   Sulfur Rash    PAST MEDICAL HISTORY Past Medical History:  Diagnosis Date   Antral gastritis    EGD 11/15   Asthmatic bronchitis    Back pain    Chronic diastolic heart failure ([place]) 05/20/2015   Grade 2 diastolic dysfunction.  04/2015.   Chronic kidney disease    kidney function low   Diabetes mellitus    x 5 yrs   DVT of axillary vein, acute left ([place]) 07/24/12   GERD (gastroesophageal reflux disease)    Glaucoma    POAG OU   Hyperlipidemia 05/20/2015   Hypertension    Hypertensive retinopathy    OU   Hypothyroidism    Kidney stones    Macular degeneration    Wet OD, Dry OS   Mixed hyperlipidemia    Peripheral venous insufficiency    Pinched nerve    right elbow   Sigmoid diverticulitis    Vertigo    chonic   Past Surgical History:  Procedure Laterality Date   ABDOMINAL HYSTERECTOMY     BACK SURGERY     spinal    CARDIAC CATHETERIZATION N/A 05/26/2015   Procedure: Left Heart Cath and Coronary Angiography;  Surgeon: Jettie Booze, MD;  Location: [facility];  Service: Cardiovascular;  Laterality: N/A;   CATARACT EXTRACTION Bilateral    CHOLECYSTECTOMY     COLON SURGERY     COLONOSCOPY N/A 12/27/2013   Procedure: COLONOSCOPY;  Surgeon: Rogene Houston, MD;  [facility]: [facility];  Service: Endoscopy;  Laterality: N/A;  200   COLOSTOMY CLOSURE     ESOPHAGOGASTRODUODENOSCOPY N/A 05/07/2014   Procedure: ESOPHAGOGASTRODUODENOSCOPY (EGD);  Surgeon: Rogene Houston, MD;  Location: [facility];  Service: Endoscopy;  Laterality: N/A;   EYE SURGERY Bilateral    Cat Sx   fracture left foot     HERNIA REPAIR     NM MYOCAR PERF WALL MOTION  01/28/2009   Normal   OTHER SURGICAL HISTORY     colostomy, colostomy reversal, for diverticulitis surgical hernia repair, arm surgery, neck surgery   US ECHOCARDIOGRAPHY  02/11/2010    Mild MR,trace TR & AI    FAMILY HISTORY Family History  Problem Relation Age of Onset   Other Mother 12       Cause unknown   Cancer Mother        liver   CVA Maternal Grandmother 90       deceased   Heart disease Maternal Grandmother    Stroke Maternal Grandmother    Heart attack Brother 57       deceased   Cancer Sister 75       deceased  Glaucoma Maternal Uncle     SOCIAL HISTORY Social History   Tobacco Use   Smoking status: Former Smoker    Types: Cigarettes    Quit date: 12/10/2013    Years since quitting: 6.2   Smokeless tobacco: Never Used   Tobacco comment: Smoke 1-1 1/2 packs a day  Substance Use Topics   Alcohol use: No   Drug use: No         OPHTHALMIC EXAM:  Base Eye Exam    Visual Acuity (Snellen - Linear)      Right Left   Dist [place] 20/40 -2 20/40 -1   Dist ph  NI 20/30       Tonometry (Tonopen, 2:15 PM)      Right Left   Pressure 25 17  Took pressure OD extra 3 times got 25, 25, 24.       Pupils      Dark Light Shape React APD   Right 2 1 Round Brisk None   Left 2 1 Round Brisk None       Visual Fields (Counting fingers)      Left Right    Full Full       Extraocular Movement      Right Left    Full, Ortho Full, Ortho       Neuro/Psych    Oriented x3: Yes   Mood/Affect: Normal       Dilation    Both eyes: 1.0% Mydriacyl, 2.5% Phenylephrine @ 2:15 PM        Slit Lamp and Fundus Exam    Slit Lamp Exam      Right Left   Lids/Lashes Dermatochalasis - upper lid, Meibomian gland dysfunction Dermatochalasis - upper lid, Telangiectasia, mild Meibomian gland dysfunction   Conjunctiva/Sclera White and quiet White and quiet   Cornea Trace Punctate epithelial erosions, no epi defect 1+ inferior Punctate epithelial erosions   Anterior Chamber Deep and quiet, narrow temporal angle Deep and quiet, narrow temporal angle   Iris Round and moderately dilated to 5.41mm Round and moderately dilated to 5.2mm   Lens  Posterior chamber intraocular lens Posterior chamber intraocular lens   Vitreous Vitreous syneresis Vitreous syneresis       Fundus Exam      Right Left   Disc Sharp rim, Pallor, +cupping, mild, temporal Peripapillary atrophy, superior rim thinning Pink and Sharp, temporal Peripapillary atrophy   C/D Ratio 0.75 0.6   Macula Flat, Blunted foveal reflex, +focal CNV/PED nasal macula with stable improvement in SRF, stable improvement in IRF/cystic changes, Drusen, no heme, RPE mottling and clumping Flat, Blunted foveal reflex, drusen, Retinal pigment epithelial mottling, No heme or edema   Vessels Vascular attenuation, Tortuous Vascular attenuation, Tortuous   Periphery Attached; no heme Attached; no heme          IMAGING AND PROCEDURES  Imaging and Procedures for @TODAY @  OCT, Retina - OU - Both Eyes       Right Eye Quality was good. Central Foveal Thickness: 200. Progression has been stable. Findings include intraretinal hyper-reflective material, pigment epithelial detachment, subretinal hyper-reflective material, retinal drusen , normal foveal contour, outer retinal atrophy, no IRF, no SRF (Stable improvement in IRF/cystic changes overlying PED nasal macula).   Left Eye Quality was good. Central Foveal Thickness: 215. Progression has been stable. Findings include normal foveal contour, no IRF, no SRF, retinal drusen .   Notes *Images captured and stored on drive  Diagnosis / Impression:  OD: exudative ARMD; stable  improvement in IRF/cystic changes overlying PED nasal macula OS: NFP, no IRF/SRF; +drusen -- nonexudative ARMD  Clinical management:  See below  Abbreviations: NFP - Normal foveal profile. CME - cystoid macular edema. PED - pigment epithelial detachment. IRF - intraretinal fluid. SRF - subretinal fluid. EZ - ellipsoid zone. ERM - epiretinal membrane. ORA - outer retinal atrophy. ORT - outer retinal tubulation. SRHM - subretinal hyper-reflective material         Intravitreal Injection, Pharmacologic Agent - OD - Right Eye       Time Out 03/07/2020. 2:51 PM. Confirmed correct patient, procedure, site, and patient consented.   Anesthesia Topical anesthesia was used. Anesthetic medications included Lidocaine 2%, Proparacaine 0.5%.   Procedure Preparation included 5% betadine to ocular surface, eyelid speculum. A (32g) needle was used.   Injection:  1.25 mg Bevacizumab (AVASTIN) SOLN   NDC: 67124-580-99, Lot: [id number], Expiration date: 03/17/2020   Route: Intravitreal, Site: Right Eye, Waste: 0.05 mL  Post-op Post injection exam found visual acuity of at least counting fingers. The patient tolerated the procedure well. There were no complications. The patient received written and verbal post procedure care education.   Notes An AC tap was performed following injection due to elevated IOP using a 30 gauge needle on a syringe with the plunger removed. The needle was placed at the limbus at 7 oclock and approximately 0.08 cc of aqueous was removed from the anterior chamber. Betadine was applied to the tap area before and after the paracentesis was performed. There were no complications. The patient tolerated the procedure well. The IOP was rechecked and was found to be ~9 mmHg by palpation.                 ASSESSMENT/PLAN:    ICD-10-CM   1. Exudative age-related macular degeneration of right eye with active choroidal neovascularization (HCC)  H35.3211 Intravitreal Injection, Pharmacologic Agent - OD - Right Eye    Bevacizumab (AVASTIN) SOLN 1.25 mg  2. Retinal edema  H35.81 OCT, Retina - OU - Both Eyes  3. Intermediate stage nonexudative age-related macular degeneration of left eye  H35.3122   4. Diabetes mellitus type 2 without retinopathy ([place])  E11.9   5. Essential hypertension  I10   6. Hypertensive retinopathy of both eyes  H35.033   7. Pseudophakia of both eyes  Z96.1   8. Primary open angle glaucoma of both eyes, unspecified  glaucoma stage  H40.1130    1,2. 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, OD    - delayed follow up from 4 weeks to 8 weeks due to passing of husband (12.11.20-02.12.21)  - s/p IVA OD #1 (12.11.20), #2 (02.12.21), #3 (03.12.21), #4 (04.09.21), #5 (05.14.21), #6 (06.17.21), #7 (07.23.21)  - FA 12.11.20 confirms +CNVM  - OCT today shows stable improvement in IRF overlying nasal PED  - BCVA OD stable at 20/25-2  - recommend IVA OD #8 today, 09.03.21--maintenance w/ extension to 6 wks  - pt wishes to proceed with injection  - RBA of procedure discussed, questions answered  - informed consent obtained and signed  - see procedure note -- AC tap performed  - Avastin informed consent form signed and scanned on 12.11.2020 (OD)  - of note, pt presented acutely on 03.16.21 due to decreased vision after injection OD -- ?post injection epi defect  - f/u in 6 wks -- DFE/OCT/possible injection; treat and extend as able  3. Age related macular degeneration, non-exudative, OS  - intermediate stage  - The incidence, anatomy,  and pathology of dry AMD, risk of progression, and the AREDS and AREDS 2 study including smoking risks discussed with patient.  - Recommend amsler grid monitoring  4. Diabetes mellitus, type 2 without retinopathy  - The incidence, risk factors for progression, natural history and treatment options for diabetic retinopathy  were discussed with patient.    - The need for close monitoring of blood glucose, blood pressure, and serum lipids, avoiding cigarette or any type of tobacco, and the need for long term follow up was also discussed with patient.  - monitor  5,6. Hypertensive retinopathy OU  - discussed importance of tight BP control  - monitor  7. Pseudophakia OU  - s/p CE/IOL (Dr. Venetia Maxon)  - beautiful surgery, doing well  - monitor  8. POAG OU  - formerly managed by Dr. Venetia Maxon  - s/p laser w/ Dr. Venetia Maxon -- ?SLT  - IOP 25,17  - currently on latanoprost QHS OU  -  add Cosopt BID OD only  - monitor   Ophthalmic Meds Ordered this visit:  Meds ordered this encounter  Medications   dorzolamide-timolol (COSOPT) 22.3-6.8 MG/ML ophthalmic solution    Sig: Place 1 drop into the right eye 2 (two) times daily.    Dispense:  10 mL    Refill:  6   Bevacizumab (AVASTIN) SOLN 1.25 mg       Return in about 6 weeks (around 04/18/2020) for f/u exu ARMD OD, DFE, OCT.  There are no Patient Instructions on file for this visit.   Explained the diagnoses, plan, and follow up with the patient and they expressed understanding.  Patient expressed understanding of the importance of proper follow up care.   This document serves as a record of services personally performed by Gardiner Sleeper, MD, PhD. It was created on their behalf by San Jetty. Owens Shark, OA an ophthalmic technician. The creation of this record is the provider's dictation and/or activities during the visit.    Electronically signed by: San Jetty. Owens Shark, [place] 09.01.2021 5:09 PM  Gardiner Sleeper, M.D., Ph.D. Diseases & Surgery of the Retina and Vitreous [facility]  I have reviewed the above documentation for accuracy and completeness, and I agree with the above. Gardiner Sleeper, M.D., Ph.D. 03/07/20 5:09 PM   Abbreviations: M myopia (nearsighted); A astigmatism; H hyperopia (farsighted); P presbyopia; Mrx spectacle prescription;  CTL contact lenses; OD right eye; OS left eye; OU both eyes  XT exotropia; ET esotropia; PEK punctate epithelial keratitis; PEE punctate epithelial erosions; DES dry eye syndrome; MGD meibomian gland dysfunction; ATs artificial tears; PFAT's preservative free artificial tears; [place] nuclear sclerotic cataract; PSC posterior subcapsular cataract; ERM epi-retinal membrane; PVD posterior vitreous detachment; RD retinal detachment; DM diabetes mellitus; DR diabetic retinopathy; NPDR non-proliferative diabetic retinopathy; PDR proliferative diabetic retinopathy; CSME  clinically significant macular edema; DME diabetic macular edema; dbh dot blot hemorrhages; CWS cotton wool spot; POAG primary open angle glaucoma; C/D cup-to-disc ratio; HVF humphrey visual field; GVF goldmann visual field; OCT optical coherence tomography; IOP intraocular pressure; BRVO Branch retinal vein occlusion; CRVO central retinal vein occlusion; CRAO central retinal artery occlusion; BRAO branch retinal artery occlusion; RT retinal tear; SB scleral buckle; PPV pars plana vitrectomy; VH Vitreous hemorrhage; PRP panretinal laser photocoagulation; IVK intravitreal kenalog; VMT vitreomacular traction; MH Macular hole;  NVD neovascularization of the disc; NVE neovascularization elsewhere; AREDS age related eye disease study; ARMD age related macular degeneration; POAG primary open angle glaucoma; EBMD epithelial/anterior basement membrane dystrophy;  ACIOL anterior chamber intraocular lens; IOL intraocular lens; PCIOL posterior chamber intraocular lens; Phaco/IOL phacoemulsification with intraocular lens placement; [place] photorefractive keratectomy; LASIK laser assisted in situ keratomileusis; HTN hypertension; DM diabetes mellitus; COPD chronic obstructive pulmonary disease

## 2020-03-07 ENCOUNTER — Ambulatory Visit (INDEPENDENT_AMBULATORY_CARE_PROVIDER_SITE_OTHER): Payer: Medicare Other | Admitting: Ophthalmology

## 2020-03-07 ENCOUNTER — Other Ambulatory Visit: Payer: Self-pay

## 2020-03-07 ENCOUNTER — Encounter (INDEPENDENT_AMBULATORY_CARE_PROVIDER_SITE_OTHER): Payer: Self-pay | Admitting: Ophthalmology

## 2020-03-07 DIAGNOSIS — I10 Essential (primary) hypertension: Secondary | ICD-10-CM

## 2020-03-07 DIAGNOSIS — H353122 Nonexudative age-related macular degeneration, left eye, intermediate dry stage: Secondary | ICD-10-CM

## 2020-03-07 DIAGNOSIS — Z961 Presence of intraocular lens: Secondary | ICD-10-CM

## 2020-03-07 DIAGNOSIS — H3581 Retinal edema: Secondary | ICD-10-CM | POA: Diagnosis not present

## 2020-03-07 DIAGNOSIS — H401130 Primary open-angle glaucoma, bilateral, stage unspecified: Secondary | ICD-10-CM

## 2020-03-07 DIAGNOSIS — H353211 Exudative age-related macular degeneration, right eye, with active choroidal neovascularization: Secondary | ICD-10-CM | POA: Diagnosis not present

## 2020-03-07 DIAGNOSIS — H35033 Hypertensive retinopathy, bilateral: Secondary | ICD-10-CM

## 2020-03-07 DIAGNOSIS — E119 Type 2 diabetes mellitus without complications: Secondary | ICD-10-CM | POA: Diagnosis not present

## 2020-03-07 MED ORDER — BEVACIZUMAB CHEMO INJECTION 1.25MG/0.05ML SYRINGE FOR KALEIDOSCOPE
1.2500 mg | INTRAVITREAL | Status: AC | PRN
  Administered 2020-03-07: 1.25 mg via INTRAVITREAL

## 2020-03-07 MED ORDER — DORZOLAMIDE HCL-TIMOLOL MAL 2-0.5 % OP SOLN
1.0000 [drp] | Freq: Two times a day (BID) | OPHTHALMIC | 6 refills | Status: DC
Start: 2020-03-07 — End: 2020-08-28

## 2020-03-11 ENCOUNTER — Other Ambulatory Visit: Payer: Self-pay

## 2020-03-11 ENCOUNTER — Ambulatory Visit (INDEPENDENT_AMBULATORY_CARE_PROVIDER_SITE_OTHER): Payer: Medicare Other | Admitting: Ophthalmology

## 2020-03-11 ENCOUNTER — Encounter (INDEPENDENT_AMBULATORY_CARE_PROVIDER_SITE_OTHER): Payer: Self-pay | Admitting: Ophthalmology

## 2020-03-11 DIAGNOSIS — Z961 Presence of intraocular lens: Secondary | ICD-10-CM

## 2020-03-11 DIAGNOSIS — I10 Essential (primary) hypertension: Secondary | ICD-10-CM | POA: Diagnosis not present

## 2020-03-11 DIAGNOSIS — H353211 Exudative age-related macular degeneration, right eye, with active choroidal neovascularization: Secondary | ICD-10-CM | POA: Diagnosis not present

## 2020-03-11 DIAGNOSIS — H3581 Retinal edema: Secondary | ICD-10-CM

## 2020-03-11 DIAGNOSIS — H353122 Nonexudative age-related macular degeneration, left eye, intermediate dry stage: Secondary | ICD-10-CM

## 2020-03-11 DIAGNOSIS — H35033 Hypertensive retinopathy, bilateral: Secondary | ICD-10-CM

## 2020-03-11 DIAGNOSIS — E119 Type 2 diabetes mellitus without complications: Secondary | ICD-10-CM

## 2020-03-11 DIAGNOSIS — H401130 Primary open-angle glaucoma, bilateral, stage unspecified: Secondary | ICD-10-CM

## 2020-03-11 NOTE — Progress Notes (Signed)
[facility] Note  03/11/2020     CHIEF COMPLAINT Patient presents for Retina Follow Up   HISTORY OF PRESENT ILLNESS: [name] is a 79 y.o. female who presents to the clinic today for:   HPI    Retina Follow Up    Patient presents with  Other.  In right eye.  This started 4 days ago.  Severity is moderate.  I, the attending physician,  performed the HPI with the patient and updated documentation appropriately.          Comments    Patient here for 4 days cornea check OD. Patient states vision is still a little blurry. Has eye pain. Better today. Has to wash OD to to get eye open with warm water. Used eye drops.        Last edited by Bernarda Caffey, MD on 03/11/2020 10:55 AM. (History)    Patient feels VA OD still a bit blurred  Referring physician: Sharilyn Sites, MD [place],  [place] [id number]  HISTORICAL INFORMATION:   Selected notes from the MEDICAL RECORD NUMBER Referred by Dr. Madelin Headings for concern of SRF OD LEE: 11.27.20 (M. Cotter) [BCVA: OD: 20/80-- OS: 20/60-]  Ocular Hx-glaucoma (latanoprost)  PMH-DM    CURRENT MEDICATIONS: Current Outpatient Medications (Ophthalmic Drugs)  Medication Sig  . dorzolamide-timolol (COSOPT) 22.3-6.8 MG/ML ophthalmic solution Place 1 drop into the right eye 2 (two) times daily.  [name] latanoprost (XALATAN) 0.005 % ophthalmic solution Place 1 drop into both eyes at bedtime.   [name] tetrahydrozoline 0.05 % ophthalmic solution Place 1 drop into both eyes at bedtime. (Patient not taking: Reported on 12/21/2019)   No current facility-administered medications for this visit. (Ophthalmic Drugs)   Current Outpatient Medications (Other)  Medication Sig  . albuterol (PROVENTIL HFA;VENTOLIN HFA) 108 (90 BASE) MCG/ACT inhaler Inhale 2 puffs into the lungs every 4 (four) hours as needed for shortness of breath.  [name] amitriptyline (ELAVIL) 25 MG tablet Take 50 mg by mouth at bedtime.   . Bempedoic  Acid-Ezetimibe (NEXLIZET) 180-10 MG TABS Take 1 tablet by mouth daily.  . Black Cohosh 40 MG CAPS Take 40 mg by mouth 2 (two) times daily.  . carvedilol (COREG) 6.25 MG tablet TAKE 1 TABLET BY MOUTH  TWICE DAILY  . cetirizine (ZYRTEC) 10 MG tablet Take 10 mg by mouth daily.  . Cholecalciferol (VITAMIN D-3) 1000 units CAPS Take 1 capsule by mouth daily.  [name] esomeprazole (NEXIUM) 20 MG capsule Take 20 mg by mouth daily at 12 noon.  . furosemide (LASIX) 40 MG tablet TAKE 1 TABLET (40 MG TOTAL) BY MOUTH DAILY. MAY TAKE EXTRA DAILY AS NEEDED FOR SWELLING  . gabapentin (NEURONTIN) 300 MG capsule Take 300 mg by mouth 3 (three) times daily.  [name] glimepiride (AMARYL) 2 MG tablet Take 2 mg by mouth daily.  . hydrALAZINE (APRESOLINE) 25 MG tablet Take 1 tablet (25 mg total) by mouth in the morning and at bedtime.  . isosorbide mononitrate (IMDUR) 30 MG 24 hr tablet TAKE 1 TABLET BY MOUTH  DAILY  . meclizine (ANTIVERT) 25 MG tablet Take 25 mg by mouth as needed for dizziness.   . Omega-3 Fatty Acids (FISH OIL) 1200 MG CAPS Take 1 capsule by mouth 2 (two) times daily.  . polyethylene glycol (MIRALAX / GLYCOLAX) packet Take 17 g by mouth daily.  . potassium chloride SA (KLOR-CON) 20 MEQ tablet TAKE ONE-HALF TABLET BY  MOUTH DAILY  . sitaGLIPtin (  JANUVIA) 50 MG tablet Take 1 tablet by mouth daily.  . vitamin B-12 (CYANOCOBALAMIN) 100 MCG tablet Take 1 tablet by mouth daily.  [name] warfarin (COUMADIN) 1 MG tablet Take 1 mg by mouth daily.   No current facility-administered medications for this visit. (Other)      REVIEW OF SYSTEMS: ROS    Positive for: Genitourinary, Endocrine, Cardiovascular, Eyes   Negative for: Constitutional, Gastrointestinal, Neurological, Skin, Musculoskeletal, HENT, Respiratory, Psychiatric, Allergic/Imm, Heme/Lymph   Last edited by Theodore Demark, COA on 03/11/2020  9:33 AM. (History)       ALLERGIES Allergies  Allergen Reactions  . Vioxx [Rofecoxib] Shortness Of Breath  .  Metformin And Related     Kidney failure  . Penicillins     rash  . Pravastatin   . Codeine Rash  . Motrin [Ibuprofen] Rash  . Sulfur Rash    PAST MEDICAL HISTORY Past Medical History:  Diagnosis Date  . Antral gastritis    EGD 11/15  . Asthmatic bronchitis   . Back pain   . Chronic diastolic heart failure (HCC) 05/20/2015   Grade 2 diastolic dysfunction.  04/2015.  [name] Chronic kidney disease    kidney function low  . Diabetes mellitus    x 5 yrs  . DVT of axillary vein, acute left ([place]) 07/24/12  . GERD (gastroesophageal reflux disease)   . Glaucoma    POAG OU  . Hyperlipidemia 05/20/2015  . Hypertension   . Hypertensive retinopathy    OU  . Hypothyroidism   . Kidney stones   . Macular degeneration    Wet OD, Dry OS  . Mixed hyperlipidemia   . Peripheral venous insufficiency   . Pinched nerve    right elbow  . Sigmoid diverticulitis   . Vertigo    chonic   Past Surgical History:  Procedure Laterality Date  . ABDOMINAL HYSTERECTOMY    . BACK SURGERY     spinal   . CARDIAC CATHETERIZATION N/A 05/26/2015   Procedure: Left Heart Cath and Coronary Angiography;  Surgeon: Jettie Booze, MD;  Location: [facility];  Service: Cardiovascular;  Laterality: N/A;  . CATARACT EXTRACTION Bilateral   . CHOLECYSTECTOMY    . COLON SURGERY    . COLONOSCOPY N/A 12/27/2013   Procedure: COLONOSCOPY;  Surgeon: Rogene Houston, MD;  [facility];  Service: Endoscopy;  Laterality: N/A;  200  . COLOSTOMY CLOSURE    . ESOPHAGOGASTRODUODENOSCOPY N/A 05/07/2014   Procedure: ESOPHAGOGASTRODUODENOSCOPY (EGD);  Surgeon: Rogene Houston, MD;  [facility]: [facility];  Service: Endoscopy;  Laterality: N/A;  . EYE SURGERY Bilateral    Cat Sx  . fracture left foot    . HERNIA REPAIR    . NM MYOCAR PERF WALL MOTION  01/28/2009   Normal  . OTHER SURGICAL HISTORY     colostomy, colostomy reversal, for diverticulitis surgical hernia repair, arm surgery, neck surgery  . US  ECHOCARDIOGRAPHY  02/11/2010   Mild MR,trace TR & AI    FAMILY HISTORY Family History  Problem Relation Age of Onset  . Other Mother 69       Cause unknown  . Cancer Mother        liver  . CVA Maternal Grandmother 90       deceased  . Heart disease Maternal Grandmother   . Stroke Maternal Grandmother   . Heart attack Brother 65       deceased  . Cancer Sister 55  deceased  . Glaucoma Maternal Uncle     SOCIAL HISTORY Social History   Tobacco Use  . Smoking status: Former Smoker    Types: Cigarettes    Quit date: 12/10/2013    Years since quitting: 6.2  . Smokeless tobacco: Never Used  . Tobacco comment: Smoke 1-1 1/2 packs a day  Substance Use Topics  . Alcohol use: No  . Drug use: No         OPHTHALMIC EXAM:  Base Eye Exam    Visual Acuity (Snellen - Linear)      Right Left   Dist [place] 20/70 -1 20/40 -2   Dist ph [place] 20/50 NI       Tonometry (Tonopen, 9:29 AM)      Right Left   Pressure 17 17       Pupils      Dark Light Shape React APD   Right 2 1 Round Brisk None   Left 2 1 Round Brisk None       Visual Fields (Counting fingers)      Left Right    Full Full       Extraocular Movement      Right Left    Full, Ortho Full, Ortho       Neuro/Psych    Oriented x3: Yes   Mood/Affect: Normal       Dilation    Right eye: 1.0% Mydriacyl, 2.5% Phenylephrine @ 9:29 AM        Slit Lamp and Fundus Exam    Slit Lamp Exam      Right Left   Lids/Lashes Dermatochalasis - upper lid, Meibomian gland dysfunction Dermatochalasis - upper lid, Telangiectasia, mild Meibomian gland dysfunction   Conjunctiva/Sclera White and quiet White and quiet   Cornea 2+ inferior Punctate epithelial erosions, no epi defect 1+ inferior Punctate epithelial erosions   Anterior Chamber Deep and quiet, narrow temporal angle Deep and quiet, narrow temporal angle   Iris Round and dilated Round and moderately dilated to 5.74mm   Lens Posterior chamber intraocular lens  Posterior chamber intraocular lens   Vitreous Vitreous syneresis Vitreous syneresis       Fundus Exam      Right Left   Disc Sharp rim, Pallor, +cupping, mild, temporal Peripapillary atrophy, superior rim thinning Pink and Sharp, temporal Peripapillary atrophy   C/D Ratio 0.75 0.6   Macula Flat, Blunted foveal reflex, +focal CNV/PED nasal macula with stable improvement in SRF, stable improvement in IRF/cystic changes, Drusen, no heme, RPE mottling and clumping Flat, Blunted foveal reflex, drusen, Retinal pigment epithelial mottling, No heme or edema   Vessels Vascular attenuation, Tortuous Vascular attenuation, Tortuous   Periphery Attached; no heme Attached; no heme          IMAGING AND PROCEDURES  Imaging and Procedures for @TODAY @  OCT, Retina - OU - Both Eyes       Right Eye Quality was good. Central Foveal Thickness: 198. Progression has been stable. Findings include intraretinal hyper-reflective material, pigment epithelial detachment, subretinal hyper-reflective material, retinal drusen , normal foveal contour, outer retinal atrophy, no IRF, no SRF (Stable improvement in IRF/cystic changes overlying PED nasal macula).   Left Eye Quality was good. Central Foveal Thickness: 211. Progression has been stable. Findings include normal foveal contour, no IRF, no SRF, retinal drusen .   Notes *Images captured and stored on drive  Diagnosis / Impression:  OD: exudative ARMD; stable improvement in IRF/cystic changes overlying PED nasal macula OS: NFP,  no IRF/SRF; +drusen -- nonexudative ARMD  Clinical management:  See below  Abbreviations: NFP - Normal foveal profile. CME - cystoid macular edema. PED - pigment epithelial detachment. IRF - intraretinal fluid. SRF - subretinal fluid. EZ - ellipsoid zone. ERM - epiretinal membrane. ORA - outer retinal atrophy. ORT - outer retinal tubulation. SRHM - subretinal hyper-reflective material                 ASSESSMENT/PLAN:     ICD-10-CM   1. Exudative age-related macular degeneration of right eye with active choroidal neovascularization ([place])  H35.3211   2. Retinal edema  H35.81 OCT, Retina - OU - Both Eyes  3. Intermediate stage nonexudative age-related macular degeneration of left eye  H35.3122   4. Diabetes mellitus type 2 without retinopathy ([place])  E11.9   5. Essential hypertension  I10   6. Hypertensive retinopathy of both eyes  H35.033   7. Pseudophakia of both eyes  Z96.1   8. Primary open angle glaucoma of both eyes, unspecified glaucoma stage  H40.1130    **here today for K check -- after injection last Friday, had corneal abrasion -- started Polymixin QID  - epi defect closed  - stop polymixin; continue ATs  - f/u 6 wks as scheduled  1,2. 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, OD    - delayed follow up from 4 weeks to 8 weeks due to passing of husband (12.11.20-02.12.21)  - s/p IVA OD #1 (12.11.20), #2 (02.12.21), #3 (03.12.21), #4 (04.09.21), #5 (05.14.21), #6 (06.17.21), #7 (07.23.21)  - FA 12.11.20 confirms +CNVM  - OCT today shows stable improvement in IRF overlying nasal PED             - Here for 4 day k ck OD  - BCVA OD 20/50             - Continue to use AT OD  - Avastin informed consent form signed and scanned on 12.11.2020 (OD)  - of note, pt presented acutely on 03.16.21 due to decreased vision after injection OD -- ?post injection epi defect  - f/u in 6 wks -- DFE/OCT/possible injection; treat and extend as able  3. Age related macular degeneration, non-exudative, OS  - intermediate stage  - The incidence, anatomy, and pathology of dry AMD, risk of progression, and the AREDS and AREDS 2 study including smoking risks discussed with patient.  - Recommend amsler grid monitoring  4. Diabetes mellitus, type 2 without retinopathy  - The incidence, risk factors for progression, natural history and treatment options for diabetic retinopathy  were discussed with patient.    - The need  for close monitoring of blood glucose, blood pressure, and serum lipids, avoiding cigarette or any type of tobacco, and the need for long term follow up was also discussed with patient.  - monitor  5,6. Hypertensive retinopathy OU  - discussed importance of tight BP control  - monitor  7. Pseudophakia OU  - s/p CE/IOL (Dr. Venetia Maxon)  - beautiful surgery, doing well  - monitor  8. POAG OU  - formerly managed by Dr. Venetia Maxon  - s/p laser w/ Dr. Venetia Maxon -- ?SLT  - IOP 17 OU  - currently on latanoprost QHS OU  - cont Cosopt BID OD only  - monitor   Ophthalmic Meds Ordered this visit:  No orders of the defined types were placed in this encounter.      Return for 6 wks as scheduled.  There are no Patient Instructions on file  for this visit.   Explained the diagnoses, plan, and follow up with the patient and they expressed understanding.  Patient expressed understanding of the importance of proper follow up care.   This document serves as a record of services personally performed by Gardiner Sleeper, MD, PhD. It was created on their behalf by San Jetty. Owens Shark, OA an ophthalmic technician. The creation of this record is the provider's dictation and/or activities during the visit.    Electronically signed by: San Jetty. Owens Shark, [place] 09.07.2021 10:47 PM   This document serves as a record of services personally performed by Gardiner Sleeper, MD, PhD. It was created on their behalf by Estill Bakes, COT an ophthalmic technician. The creation of this record is the provider's dictation and/or activities during the visit.    Electronically signed by: Estill Bakes, COT 9.7.21 @ 10:47 PM  Gardiner Sleeper, M.D., Ph.D. Diseases & Surgery of the Retina and [place] 9.7.21  I have reviewed the above documentation for accuracy and completeness, and I agree with the above. Gardiner Sleeper, M.D., Ph.D. 03/11/20 10:47 PM   Abbreviations: M myopia (nearsighted);  A astigmatism; H hyperopia (farsighted); P presbyopia; Mrx spectacle prescription;  CTL contact lenses; OD right eye; OS left eye; OU both eyes  XT exotropia; ET esotropia; PEK punctate epithelial keratitis; PEE punctate epithelial erosions; DES dry eye syndrome; MGD meibomian gland dysfunction; ATs artificial tears; PFAT's preservative free artificial tears; [place] nuclear sclerotic cataract; PSC posterior subcapsular cataract; ERM epi-retinal membrane; PVD posterior vitreous detachment; RD retinal detachment; DM diabetes mellitus; DR diabetic retinopathy; NPDR non-proliferative diabetic retinopathy; PDR proliferative diabetic retinopathy; CSME clinically significant macular edema; DME diabetic macular edema; dbh dot blot hemorrhages; CWS cotton wool spot; POAG primary open angle glaucoma; C/D cup-to-disc ratio; HVF humphrey visual field; GVF goldmann visual field; OCT optical coherence tomography; IOP intraocular pressure; BRVO Branch retinal vein occlusion; CRVO central retinal vein occlusion; CRAO central retinal artery occlusion; BRAO branch retinal artery occlusion; RT retinal tear; SB scleral buckle; PPV pars plana vitrectomy; VH Vitreous hemorrhage; PRP panretinal laser photocoagulation; IVK intravitreal kenalog; VMT vitreomacular traction; MH Macular hole;  NVD neovascularization of the disc; NVE neovascularization elsewhere; AREDS age related eye disease study; ARMD age related macular degeneration; POAG primary open angle glaucoma; EBMD epithelial/anterior basement membrane dystrophy; ACIOL anterior chamber intraocular lens; IOL intraocular lens; PCIOL posterior chamber intraocular lens; Phaco/IOL phacoemulsification with intraocular lens placement; [place] photorefractive keratectomy; LASIK laser assisted in situ keratomileusis; HTN hypertension; DM diabetes mellitus; COPD chronic obstructive pulmonary disease

## 2020-04-03 DIAGNOSIS — H353122 Nonexudative age-related macular degeneration, left eye, intermediate dry stage: Secondary | ICD-10-CM | POA: Diagnosis not present

## 2020-04-03 DIAGNOSIS — H353211 Exudative age-related macular degeneration, right eye, with active choroidal neovascularization: Secondary | ICD-10-CM | POA: Diagnosis not present

## 2020-04-03 DIAGNOSIS — I2510 Atherosclerotic heart disease of native coronary artery without angina pectoris: Secondary | ICD-10-CM | POA: Diagnosis not present

## 2020-04-03 DIAGNOSIS — I10 Essential (primary) hypertension: Secondary | ICD-10-CM | POA: Diagnosis not present

## 2020-04-11 ENCOUNTER — Ambulatory Visit: Payer: Medicare Other | Admitting: Cardiovascular Disease

## 2020-04-11 ENCOUNTER — Encounter: Payer: Self-pay | Admitting: Cardiovascular Disease

## 2020-04-11 ENCOUNTER — Other Ambulatory Visit: Payer: Self-pay

## 2020-04-11 VITALS — BP 140/80 | HR 75 | Ht 62.25 in | Wt 178.0 lb

## 2020-04-11 DIAGNOSIS — I10 Essential (primary) hypertension: Secondary | ICD-10-CM | POA: Diagnosis not present

## 2020-04-11 DIAGNOSIS — I5032 Chronic diastolic (congestive) heart failure: Secondary | ICD-10-CM

## 2020-04-11 DIAGNOSIS — E7800 Pure hypercholesterolemia, unspecified: Secondary | ICD-10-CM

## 2020-04-11 MED ORDER — HYDRALAZINE HCL 25 MG PO TABS
25.0000 mg | ORAL_TABLET | Freq: Two times a day (BID) | ORAL | 3 refills | Status: DC
Start: 2020-04-11 — End: 2020-10-13

## 2020-04-11 NOTE — Progress Notes (Signed)
Cardiology Office Note   Date:  04/11/2020   ID:  [name], DOB 01/22/1941, MRN [id number]  PCP:  Sharilyn Sites, MD  Cardiologist:   Skeet Latch, MD   No chief complaint on file.    History of Present Illness: [name] is a 79 y.o. female with CAD (50% LAD), chronic diastolic heart failure (grade 2), hypertension, hyperlipidemia, prior DVT on warfarin, and diabetes type 2 who presents for follow up.  She was first seen 04/2015 at which time she reported occasional chest pain.  She had an exercise Myoview 04/2015 that showed LVEF 78% with a small defect of moderate severity in the mid anterior and apical anterior region.  This was felt to be due to breast attenuation artifact.  However, she underwent cardiac catheterization on 05/26/15 that revealed a 50% LAD lesion. There was concern that there may be a component of vasospasm so long-acting nitrates were started. Her blood pressure and hyperlipidemia medications have been titrated due to poor control.  She is also on Lasix due to lower extremity edema.  [name]' husband died of COVID double pneumonia on 07/2019.  He was treated with Remdesivir at [facility] outpatient infusion center.  At her last appointment she was experiencing atypical chest pain.  She was referred for [facility] that revealed LVEF 74% with no ischemia.  Since then she has called our office with concern for dizziness.  She spoke with our pharmacist and was advised to stop her hydralazine.  However her dizziness did not improve and it was not thought to be due to the medication or her blood pressure.  She had an episode while sitting on the toilet had to grab the garbage can to vomit.  They recommended that she see her PCP.  She had vertigo in the past and has used meclizine.  It seems like her symptoms have been worse lately.  Her symptoms seem to be better lately.  She hasn't been able to check her BP at home.  Her LFTs were elevated so her PCP stopped her  pravastatin.  She reports that her LFTs started coming back down.    Past Medical History:  Diagnosis Date  . Antral gastritis    EGD 11/15  . Asthmatic bronchitis   . Back pain   . Chronic diastolic heart failure (HCC) 05/20/2015   Grade 2 diastolic dysfunction.  04/2015.  [name] Chronic kidney disease    kidney function low  . Diabetes mellitus    x 5 yrs  . DVT of axillary vein, acute left ([place]) 07/24/12  . GERD (gastroesophageal reflux disease)   . Glaucoma    POAG OU  . Hyperlipidemia 05/20/2015  . Hypertension   . Hypertensive retinopathy    OU  . Hypothyroidism   . Kidney stones   . Macular degeneration    Wet OD, Dry OS  . Mixed hyperlipidemia   . Peripheral venous insufficiency   . Pinched nerve    right elbow  . Sigmoid diverticulitis   . Vertigo    chonic    Past Surgical History:  Procedure Laterality Date  . ABDOMINAL HYSTERECTOMY    . BACK SURGERY     spinal   . CARDIAC CATHETERIZATION N/A 05/26/2015   Procedure: Left Heart Cath and Coronary Angiography;  Surgeon: Jettie Booze, MD;  Location: [facility];  Service: Cardiovascular;  Laterality: N/A;  . CATARACT EXTRACTION Bilateral   . CHOLECYSTECTOMY    . COLON SURGERY    .  COLONOSCOPY N/A 12/27/2013   Procedure: COLONOSCOPY;  Surgeon: Rogene Houston, MD;  Location: [facility];  Service: Endoscopy;  Laterality: N/A;  200  . COLOSTOMY CLOSURE    . ESOPHAGOGASTRODUODENOSCOPY N/A 05/07/2014   Procedure: ESOPHAGOGASTRODUODENOSCOPY (EGD);  Surgeon: Rogene Houston, MD;  Location: [facility];  Service: Endoscopy;  Laterality: N/A;  . EYE SURGERY Bilateral    Cat Sx  . fracture left foot    . HERNIA REPAIR    . NM MYOCAR PERF WALL MOTION  01/28/2009   Normal  . OTHER SURGICAL HISTORY     colostomy, colostomy reversal, for diverticulitis surgical hernia repair, arm surgery, neck surgery  . US ECHOCARDIOGRAPHY  02/11/2010   Mild MR,trace TR & AI     Current Outpatient Medications   Medication Sig Dispense Refill  . albuterol (PROVENTIL HFA;VENTOLIN HFA) 108 (90 BASE) MCG/ACT inhaler Inhale 2 puffs into the lungs every 4 (four) hours as needed for shortness of breath. 1 Inhaler 0  . amitriptyline (ELAVIL) 25 MG tablet Take 50 mg by mouth at bedtime.     . Black Cohosh 40 MG CAPS Take 40 mg by mouth 2 (two) times daily.    . carvedilol (COREG) 6.25 MG tablet TAKE 1 TABLET BY MOUTH  TWICE DAILY 180 tablet 3  . cetirizine (ZYRTEC) 10 MG tablet Take 10 mg by mouth daily.    . Cholecalciferol (VITAMIN D-3) 1000 units CAPS Take 1 capsule by mouth daily.    . diazepam (VALIUM) 2 MG tablet Take by mouth.    . dorzolamide-timolol (COSOPT) 22.3-6.8 MG/ML ophthalmic solution Place 1 drop into the right eye 2 (two) times daily. 10 mL 6  . esomeprazole (NEXIUM) 20 MG capsule Take 20 mg by mouth daily at 12 noon.    . furosemide (LASIX) 40 MG tablet TAKE 1 TABLET (40 MG TOTAL) BY MOUTH DAILY. MAY TAKE EXTRA DAILY AS NEEDED FOR SWELLING 180 tablet 1  . gabapentin (NEURONTIN) 300 MG capsule Take 300 mg by mouth 3 (three) times daily.    [name] glimepiride (AMARYL) 2 MG tablet Take 2 mg by mouth daily.  2  . hydrALAZINE (APRESOLINE) 25 MG tablet Take 1 tablet (25 mg total) by mouth in the morning and at bedtime. 180 tablet 3  . isosorbide mononitrate (IMDUR) 30 MG 24 hr tablet TAKE 1 TABLET BY MOUTH  DAILY 90 tablet 3  . latanoprost (XALATAN) 0.005 % ophthalmic solution Place 1 drop into both eyes at bedtime.     . meclizine (ANTIVERT) 25 MG tablet Take 25 mg by mouth as needed for dizziness.     . Omega-3 Fatty Acids (FISH OIL) 1200 MG CAPS Take 1 capsule by mouth 2 (two) times daily.    . polyethylene glycol (MIRALAX / GLYCOLAX) packet Take 17 g by mouth daily.    . potassium chloride SA (KLOR-CON) 20 MEQ tablet TAKE ONE-HALF TABLET BY  MOUTH DAILY 45 tablet 3  . sitaGLIPtin (JANUVIA) 50 MG tablet Take 1 tablet by mouth daily.    [name] tetrahydrozoline 0.05 % ophthalmic solution Place 1 drop  into both eyes at bedtime.     . vitamin B-12 (CYANOCOBALAMIN) 100 MCG tablet Take 1 tablet by mouth daily.    [name] warfarin (COUMADIN) 1 MG tablet Take 1 mg by mouth daily.  11  . Bempedoic Acid-Ezetimibe (NEXLIZET) 180-10 MG TABS Take 1 tablet by mouth daily. (Patient not taking: Reported on 04/11/2020) 30 tablet 11   No current facility-administered medications for  this visit.    Allergies:   Vioxx [rofecoxib], Metformin and related, Nexlizet [bempedoic acid-ezetimibe], Penicillins, Pravastatin, Codeine, Motrin [ibuprofen], and Sulfur    Social History:  The patient  reports that she quit smoking about 6 years ago. Her smoking use included cigarettes. She has never used smokeless tobacco. She reports that she does not drink alcohol and does not use drugs.   Family History:  The patient's family history includes CVA (age of onset: 1) in her maternal grandmother; Cancer in her mother; Cancer (age of onset: 78) in her sister; Glaucoma in her maternal uncle; Heart attack (age of onset: 59) in her brother; Heart disease in her maternal grandmother; Other (age of onset: 4) in her mother; Stroke in her maternal grandmother.    ROS:  Please see the history of present illness.   Otherwise, review of systems are positive for hiatal hernia, GERD    All other systems are reviewed and negative.    PHYSICAL EXAM: VS:  BP 140/80   Pulse 75   Ht 5' 2.25" (1.581 m)   Wt 178 lb (80.7 kg)   SpO2 97%   BMI 32.30 kg/m  , BMI Body mass index is 32.3 kg/m. GENERAL:  Well appearing HEENT: Pupils equal round and reactive, fundi not visualized, oral mucosa unremarkable NECK:  No jugular venous distention, waveform within normal limits, carotid upstroke brisk and symmetric, no bruit LUNGS:  Clear to auscultation bilaterally HEART:  RRR.  PMI not displaced or sustained,S1 and S2 within normal limits, no S3, no S4, no clicks, no rubs, no murmurs ABD:  Flat, positive bowel sounds normal in frequency in pitch, no  bruits, no rebound, no guarding, no midline pulsatile mass, no hepatomegaly, no splenomegaly EXT:  2 plus pulses throughout, trace LE edema, no cyanosis no clubbing SKIN:  No rashes no nodules NEURO:  Cranial nerves II through XII grossly intact, motor grossly intact throughout PSYCH:  Cognitively intact, oriented to person place and time   EKG:  EKG is ordered today. 02/16/16: Sinus rhythm rate 83 bpm.  PAC.  RBBB.  LPFB. 03/02/17: Sinus rhythm.  RBBB.  LPFB 09/15/17: Sinus rhythm.  Rate 64 bpm. RBBB  09/28/2019: Sinus rhythm.  Rate 75 bpm.  RBBB. 04/11/20: Sinus rhythm.  Rate 75 bpm.  RBBB.    Lexiscan Myoview 10/2019:  Nuclear stress EF: 74%. The left ventricular ejection fraction is hyperdynamic (>65%).  There was no ST segment deviation noted during stress.  This is a low risk study. There is no evidence of ischemia or previous infarction.  The study is normal.  Exercise Myoview 04/30/15  The left ventricular ejection fraction is hyperdynamic (>65%).  Nuclear stress EF: 78%.  Blood pressure demonstrated a hypertensive response to exercise.  Upsloping ST segment depression ST segment depression was noted during stress in the III, II and aVF leads. This is not diagnostic for ischemia.  Defect 1: There is a small defect of moderate severity present in the mid anterior and apical anterior location. Wall motion is normal, so this is likely artifact due to breast attenuation. However, cannot rule out LAD ischemia.  This is a low risk study.  Cardiac cath 05/26/15:  Mid LAD lesion, 50% stenosed. FFR of this lesion showed value of 0.85. This is felt to be nonsignificant.  Normal LVEDP.  Continue aggressive medical therapy. Would consider adding long-acting nitrate as the patient may have some component of vasospasm. Of note, in the future , if repeat catheterization was needed , would  consider using a 5 [place] guide catheter as her left main is very short and the catheter tends to  deep seat Into either the LAD or the circumflex.  Recent Labs: 10/05/2019: ALT 26; BUN 24; Creatinine, Ser 1.30; Potassium 4.2; Sodium 141   03/12/15: INR 2.9   Lipid Panel    Component Value Date/Time   CHOL 225 (H) 10/05/2019 0859   TRIG 189 (H) 10/05/2019 0859   HDL 43 10/05/2019 0859   CHOLHDL 5.2 (H) 10/05/2019 0859   CHOLHDL 4.9 08/17/2016 0914   VLDL 33 (H) 08/17/2016 0914   LDLCALC 148 (H) 10/05/2019 0859      Wt Readings from Last 3 Encounters:  04/11/20 178 lb (80.7 kg)  11/13/19 169 lb 12.8 oz (77 kg)  10/09/19 167 lb (75.8 kg)      ASSESSMENT AND PLAN:  # Non-obstructive CAD:  # Hyperlipidemia: # Chest pain: She is no longer having chest pain.  She had 50% LAD stenosis.  Lexiscan Myoview was negative 10/2019.  It seems that her chest pain has been more related to the stress of her husband dying.  Continue carvedilol, and Imdur.  Her statin was held due to transaminitis.  Will have her see PharmD to start back PCSK9 inhibitor.    # Chronic diastolic heart failure: # Hypertension:  Continue lasix, carvedilol, and losartan.  Hydralazine was discontinued when she was dizzy.  However stopping it did not improve her dizziness and it was actually due to BPPV.  We will restart hydralazine 25 mg twice daily.  She will track her pressures at home.  # DVT:  Continue warfarin.  Current medicines are reviewed at length with the patient today.  The patient does not have concerns regarding medicines.  The following changes have been made:  no change  Labs/ tests ordered today include:   Orders Placed This Encounter  Procedures  . AMB Referral to [facility]  . EKG 12-Lead     Disposition:   FU with Ilia Engelbert C. Oval Linsey, MD in 2-3 months.     Signed, Skeet Latch, MD  04/11/2020 4:55 PM    [facility]

## 2020-04-11 NOTE — Patient Instructions (Addendum)
Medication Instructions:  START HYDRALAZINE 25 MG MORNING AND EVENING   *If you need a refill on your cardiac medications before your next appointment, please call your pharmacy*  Lab Work: NONE  Testing/Procedures: NONE  Follow-Up: At [facility], you and your health needs are our priority.  As part of our continuing mission to provide you with exceptional heart care, we have created designated Provider Care Teams.  These Care Teams include your primary Cardiologist (physician) and Advanced Practice Providers (APPs -  Physician Assistants and Nurse Practitioners) who all work together to provide you with the care you need, when you need it.  We recommend signing up for the patient portal called "MyChart".  Sign up information is provided on this After Visit Summary.  MyChart is used to connect with patients for Virtual Visits (Telemedicine).  Patients are able to view lab/test results, encounter notes, upcoming appointments, etc.  Non-urgent messages can be sent to your provider as well.   To learn more about what you can do with MyChart, go to [email or web address].    Your next appointment:   2-3  month(s)  The format for your next appointment:   In Person  Provider:   You may see DR [facility]  or one of the following Advanced Practice Providers on your designated Care Team:    Kerin Ransom, PA-C  [place], [place]  Coletta Memos, [place]  You have been referred to [place]

## 2020-04-15 DIAGNOSIS — Z7901 Long term (current) use of anticoagulants: Secondary | ICD-10-CM | POA: Diagnosis not present

## 2020-04-16 NOTE — Progress Notes (Signed)
[facility] Note  04/18/2020     CHIEF COMPLAINT Patient presents for Retina Follow Up   HISTORY OF PRESENT ILLNESS: [name] is a 79 y.o. female who presents to the clinic today for:   HPI    Retina Follow Up    Patient presents with  Wet AMD.  In right eye.  This started 5.5 weeks ago.  I, the attending physician,  performed the HPI with the patient and updated documentation appropriately.          Comments    Patient here for 5.5 weeks retina follow up for exu ARMD OD. Patient states vision OS fine. OD gets blurry. At night looking at stars sees a green light and goes squiggly all over. Hard to do writing goes slanted.       Last edited by Bernarda Caffey, MD on 04/19/2020 11:58 PM. (History)    Patient states she recently fell on her front porch while watering her flowers, she fell on her right side, but did not hit her head  Referring physician: Sharilyn Sites, MD [address] [place],  [place] [address]  HISTORICAL INFORMATION:   Selected notes from the MEDICAL RECORD NUMBER Referred by Dr. Madelin Headings for concern of SRF OD LEE: 11.27.20 (M. Cotter) [BCVA: OD: 20/80-- OS: 20/60-]  Ocular Hx-glaucoma (latanoprost)  PMH-DM    CURRENT MEDICATIONS: Current Outpatient Medications (Ophthalmic Drugs)  Medication Sig  . dorzolamide-timolol (COSOPT) 22.3-6.8 MG/ML ophthalmic solution Place 1 drop into the right eye 2 (two) times daily.  [name] latanoprost (XALATAN) 0.005 % ophthalmic solution Place 1 drop into both eyes at bedtime.   [name] tetrahydrozoline 0.05 % ophthalmic solution Place 1 drop into both eyes at bedtime.    No current facility-administered medications for this visit. (Ophthalmic Drugs)   Current Outpatient Medications (Other)  Medication Sig  . albuterol (PROVENTIL HFA;VENTOLIN HFA) 108 (90 BASE) MCG/ACT inhaler Inhale 2 puffs into the lungs every 4 (four) hours as needed for shortness of breath.  [name] amitriptyline (ELAVIL)  25 MG tablet Take 50 mg by mouth at bedtime.   . Black Cohosh 40 MG CAPS Take 40 mg by mouth 2 (two) times daily.  . carvedilol (COREG) 6.25 MG tablet TAKE 1 TABLET BY MOUTH  TWICE DAILY  . cetirizine (ZYRTEC) 10 MG tablet Take 10 mg by mouth daily.  . Cholecalciferol (VITAMIN D-3) 1000 units CAPS Take 1 capsule by mouth daily.  . diazepam (VALIUM) 2 MG tablet Take by mouth.  . esomeprazole (NEXIUM) 20 MG capsule Take 20 mg by mouth daily at 12 noon.  . furosemide (LASIX) 40 MG tablet TAKE 1 TABLET (40 MG TOTAL) BY MOUTH DAILY. MAY TAKE EXTRA DAILY AS NEEDED FOR SWELLING  . gabapentin (NEURONTIN) 300 MG capsule Take 300 mg by mouth 3 (three) times daily.  [name] glimepiride (AMARYL) 2 MG tablet Take 2 mg by mouth daily.  . hydrALAZINE (APRESOLINE) 25 MG tablet Take 1 tablet (25 mg total) by mouth in the morning and at bedtime.  . isosorbide mononitrate (IMDUR) 30 MG 24 hr tablet TAKE 1 TABLET BY MOUTH  DAILY  . meclizine (ANTIVERT) 25 MG tablet Take 25 mg by mouth as needed for dizziness.   . Omega-3 Fatty Acids (FISH OIL) 1200 MG CAPS Take 1 capsule by mouth 2 (two) times daily.  . polyethylene glycol (MIRALAX / GLYCOLAX) packet Take 17 g by mouth daily.  . potassium chloride SA (KLOR-CON) 20 MEQ tablet TAKE ONE-HALF  TABLET BY  MOUTH DAILY  . sitaGLIPtin (JANUVIA) 50 MG tablet Take 1 tablet by mouth daily.  . vitamin B-12 (CYANOCOBALAMIN) 100 MCG tablet Take 1 tablet by mouth daily.  [name] warfarin (COUMADIN) 1 MG tablet Take 1 mg by mouth daily.   No current facility-administered medications for this visit. (Other)      REVIEW OF SYSTEMS: ROS    Positive for: Genitourinary, Endocrine, Cardiovascular, Eyes   Negative for: Constitutional, Gastrointestinal, Neurological, Skin, Musculoskeletal, HENT, Respiratory, Psychiatric, Allergic/Imm, Heme/Lymph   Last edited by Theodore Demark, COA on 04/18/2020  2:08 PM. (History)       ALLERGIES Allergies  Allergen Reactions  . Vioxx [Rofecoxib]  Shortness Of Breath  . Metformin And Related     Kidney failure  . Nexlizet [Bempedoic Acid-Ezetimibe]     Causes elevated Liver and Kidney function  . Penicillins     rash  . Pravastatin   . Codeine Rash  . Motrin [Ibuprofen] Rash  . Sulfur Rash    PAST MEDICAL HISTORY Past Medical History:  Diagnosis Date  . Antral gastritis    EGD 11/15  . Asthmatic bronchitis   . Back pain   . Chronic diastolic heart failure (HCC) 05/20/2015   Grade 2 diastolic dysfunction.  04/2015.  [name] Chronic kidney disease    kidney function low  . Diabetes mellitus    x 5 yrs  . DVT of axillary vein, acute left ([place]) 07/24/12  . GERD (gastroesophageal reflux disease)   . Glaucoma    POAG OU  . Hyperlipidemia 05/20/2015  . Hypertension   . Hypertensive retinopathy    OU  . Hypothyroidism   . Kidney stones   . Macular degeneration    Wet OD, Dry OS  . Mixed hyperlipidemia   . Peripheral venous insufficiency   . Pinched nerve    right elbow  . Sigmoid diverticulitis   . Vertigo    chonic   Past Surgical History:  Procedure Laterality Date  . ABDOMINAL HYSTERECTOMY    . BACK SURGERY     spinal   . CARDIAC CATHETERIZATION N/A 05/26/2015   Procedure: Left Heart Cath and Coronary Angiography;  Surgeon: Jettie Booze, MD;  Location: [facility];  Service: Cardiovascular;  Laterality: N/A;  . CATARACT EXTRACTION Bilateral   . CHOLECYSTECTOMY    . COLON SURGERY    . COLONOSCOPY N/A 12/27/2013   Procedure: COLONOSCOPY;  Surgeon: Rogene Houston, MD;  [facility];  Service: Endoscopy;  Laterality: N/A;  200  . COLOSTOMY CLOSURE    . ESOPHAGOGASTRODUODENOSCOPY N/A 05/07/2014   Procedure: ESOPHAGOGASTRODUODENOSCOPY (EGD);  Surgeon: Rogene Houston, MD;  Location: [facility];  Service: Endoscopy;  Laterality: N/A;  . EYE SURGERY Bilateral    Cat Sx  . fracture left foot    . HERNIA REPAIR    . NM MYOCAR PERF WALL MOTION  01/28/2009   Normal  . OTHER SURGICAL HISTORY      colostomy, colostomy reversal, for diverticulitis surgical hernia repair, arm surgery, neck surgery  . US ECHOCARDIOGRAPHY  02/11/2010   Mild MR,trace TR & AI    FAMILY HISTORY Family History  Problem Relation Age of Onset  . Other Mother 23       Cause unknown  . Cancer Mother        liver  . CVA Maternal Grandmother 90       deceased  . Heart disease Maternal Grandmother   . Stroke  Maternal Grandmother   . Heart attack Brother 29       deceased  . Cancer Sister 63       deceased  . Glaucoma Maternal Uncle     SOCIAL HISTORY Social History   Tobacco Use  . Smoking status: Former Smoker    Types: Cigarettes    Quit date: 12/10/2013    Years since quitting: 6.3  . Smokeless tobacco: Never Used  . Tobacco comment: Smoke 1-1 1/2 packs a day  Substance Use Topics  . Alcohol use: No  . Drug use: No         OPHTHALMIC EXAM:  Base Eye Exam    Visual Acuity (Snellen - Linear)      Right Left   Dist [place] 20/70 20/60   Dist ph [place] 20/40 20/30 -1       Tonometry (Tonopen, 2:04 PM)      Right Left   Pressure 17 17       Pupils      Dark Light Shape React APD   Right 2 1 Round Brisk None   Left 2 1 Round Brisk None       Visual Fields (Counting fingers)      Left Right    Full Full       Extraocular Movement      Right Left    Full, Ortho Full, Ortho       Neuro/Psych    Oriented x3: Yes   Mood/Affect: Normal       Dilation    Both eyes: 1.0% Mydriacyl, 2.5% Phenylephrine @ 2:04 PM        Slit Lamp and Fundus Exam    Slit Lamp Exam      Right Left   Lids/Lashes Dermatochalasis - upper lid, Meibomian gland dysfunction Dermatochalasis - upper lid, Telangiectasia, mild Meibomian gland dysfunction   Conjunctiva/Sclera White and quiet White and quiet   Cornea 2+ inferior Punctate epithelial erosions, no epi defect 1+ inferior Punctate epithelial erosions   Anterior Chamber Deep and quiet, narrow temporal angle Deep and quiet, narrow temporal angle    Iris Round and dilated Round and moderately dilated to 5.64mm   Lens Posterior chamber intraocular lens Posterior chamber intraocular lens   Vitreous Vitreous syneresis Vitreous syneresis       Fundus Exam      Right Left   Disc Sharp rim, Pallor, +cupping, mild, temporal Peripapillary atrophy, superior rim thinning Pink and Sharp, temporal Peripapillary atrophy   C/D Ratio 0.75 0.6   Macula Flat, Blunted foveal reflex, +focal CNV/PED nasal macula with partial pigment ring, stable improvement in SRF/IRF/cystic changes, Drusen, no heme, RPE mottling and clumping Flat, Blunted foveal reflex, drusen, Retinal pigment epithelial mottling, No heme or edema   Vessels Vascular attenuation, Tortuous Vascular attenuation, Tortuous   Periphery Attached; no heme Attached; no heme          IMAGING AND PROCEDURES  Imaging and Procedures for @TODAY @  OCT, Retina - OU - Both Eyes       Right Eye Quality was good. Central Foveal Thickness: 199. Progression has been stable. Findings include intraretinal hyper-reflective material, pigment epithelial detachment, subretinal hyper-reflective material, retinal drusen , normal foveal contour, outer retinal atrophy, no IRF, no SRF (Stable improvement in IRF/cystic changes overlying PED nasal macula).   Left Eye Quality was good. Central Foveal Thickness: 211. Progression has been stable. Findings include normal foveal contour, no IRF, no SRF, retinal drusen .   Notes *  Images captured and stored on drive  Diagnosis / Impression:  OD: exudative ARMD; stable improvement in IRF/cystic changes overlying PED nasal macula OS: NFP, no IRF/SRF; +drusen -- nonexudative ARMD  Clinical management:  See below  Abbreviations: NFP - Normal foveal profile. CME - cystoid macular edema. PED - pigment epithelial detachment. IRF - intraretinal fluid. SRF - subretinal fluid. EZ - ellipsoid zone. ERM - epiretinal membrane. ORA - outer retinal atrophy. ORT - outer  retinal tubulation. SRHM - subretinal hyper-reflective material        Intravitreal Injection, Pharmacologic Agent - OD - Right Eye       Time Out 04/18/2020. 3:05 PM. Confirmed correct patient, procedure, site, and patient consented.   Anesthesia Topical anesthesia was used. Anesthetic medications included Lidocaine 2%, Proparacaine 0.5%.   Procedure Preparation included 5% betadine to ocular surface, eyelid speculum. A (32g) needle was used.   Injection:  1.25 mg Bevacizumab (AVASTIN) SOLN   NDC: 95188-416-60, Lot: [id number] , Expiration date: 05/22/2020   Route: Intravitreal, Site: Right Eye, Waste: 0 mL  Post-op Post injection exam found visual acuity of at least counting fingers. The patient tolerated the procedure well. There were no complications. The patient received written and verbal post procedure care education.   Notes                  ASSESSMENT/PLAN:    ICD-10-CM   1. Exudative age-related macular degeneration of right eye with active choroidal neovascularization (HCC)  H35.3211 Intravitreal Injection, Pharmacologic Agent - OD - Right Eye    Bevacizumab (AVASTIN) SOLN 1.25 mg  2. Retinal edema  H35.81 OCT, Retina - OU - Both Eyes  3. Intermediate stage nonexudative age-related macular degeneration of left eye  H35.3122   4. Diabetes mellitus type 2 without retinopathy ([place])  E11.9   5. Essential hypertension  I10   6. Hypertensive retinopathy of both eyes  H35.033   7. Pseudophakia of both eyes  Z96.1   8. Primary open angle glaucoma of both eyes, unspecified glaucoma stage  H40.1130    1,2. 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, OD    - delayed follow up from 4 weeks to 8 weeks due to passing of husband (12.11.20-02.12.21)  - s/p IVA OD #1 (12.11.20), #2 (02.12.21), #3 (03.12.21), #4 (04.09.21), #5 (05.14.21), #6 (06.17.21), #7 (07.23.21)  - FA 12.11.20 confirms +CNVM  - OCT today shows stable improvement in IRF overlying nasal PED  - BCVA  OD 20/40  - recommend IVA OD #8 today, 10.15.21  - pt wishes to proceed  - RBA of procedure discussed, questions answered  - informed consent obtained and signed  - see procedure note  - Avastin informed consent form signed and scanned on 12.11.2020 (OD)  - of note, pt presented acutely on 03.16.21 due to decreased vision after injection OD -- ?post injection epi defect -- no epi defect today             - Continue to use AT OD  - f/u in 7 wks -- DFE/OCT/possible injection; treat and extend as able  3. Age related macular degeneration, non-exudative, OS  - intermediate stage  - The incidence, anatomy, and pathology of dry AMD, risk of progression, and the AREDS and AREDS 2 study including smoking risks discussed with patient.  - Recommend amsler grid monitoring  4. Diabetes mellitus, type 2 without retinopathy  - The incidence, risk factors for progression, natural history and treatment options for diabetic retinopathy  were discussed with patient.    -  The need for close monitoring of blood glucose, blood pressure, and serum lipids, avoiding cigarette or any type of tobacco, and the need for long term follow up was also discussed with patient.  - monitor  5,6. Hypertensive retinopathy OU  - discussed importance of tight BP control  - monitor  7. Pseudophakia OU  - s/p CE/IOL (Dr. Venetia Maxon)  - beautiful surgery, doing well  - monitor  8. POAG OU  - formerly managed by Dr. Venetia Maxon  - s/p laser w/ Dr. Venetia Maxon -- ?SLT  - IOP 17 OU  - currently on latanoprost QHS OU  - cont Cosopt BID OD only  - monitor   Ophthalmic Meds Ordered this visit:  Meds ordered this encounter  Medications  . Bevacizumab (AVASTIN) SOLN 1.25 mg       Return in about 7 weeks (around 06/06/2020) for f/u exu ARMD OD, DFE, OCT.  There are no Patient Instructions on file for this visit.   Explained the diagnoses, plan, and follow up with the patient and they expressed understanding.  Patient  expressed understanding of the importance of proper follow up care.   This document serves as a record of services personally performed by Gardiner Sleeper, MD, PhD. It was created on their behalf by San Jetty. Owens Shark, OA an ophthalmic technician. The creation of this record is the provider's dictation and/or activities during the visit.    Electronically signed by: San Jetty. Owens Shark, [place] 10.13.2021 12:07 AM  Gardiner Sleeper, M.D., Ph.D. Diseases & Surgery of the Retina and Vitreous [facility]  I have reviewed the above documentation for accuracy and completeness, and I agree with the above. Gardiner Sleeper, M.D., Ph.D. 04/20/20 12:07 AM   Abbreviations: M myopia (nearsighted); A astigmatism; H hyperopia (farsighted); P presbyopia; Mrx spectacle prescription;  CTL contact lenses; OD right eye; OS left eye; OU both eyes  XT exotropia; ET esotropia; PEK punctate epithelial keratitis; PEE punctate epithelial erosions; DES dry eye syndrome; MGD meibomian gland dysfunction; ATs artificial tears; PFAT's preservative free artificial tears; [place] nuclear sclerotic cataract; PSC posterior subcapsular cataract; ERM epi-retinal membrane; PVD posterior vitreous detachment; RD retinal detachment; DM diabetes mellitus; DR diabetic retinopathy; NPDR non-proliferative diabetic retinopathy; PDR proliferative diabetic retinopathy; CSME clinically significant macular edema; DME diabetic macular edema; dbh dot blot hemorrhages; CWS cotton wool spot; POAG primary open angle glaucoma; C/D cup-to-disc ratio; HVF humphrey visual field; GVF goldmann visual field; OCT optical coherence tomography; IOP intraocular pressure; BRVO Branch retinal vein occlusion; CRVO central retinal vein occlusion; CRAO central retinal artery occlusion; BRAO branch retinal artery occlusion; RT retinal tear; SB scleral buckle; PPV pars plana vitrectomy; VH Vitreous hemorrhage; PRP panretinal laser photocoagulation; IVK intravitreal  kenalog; VMT vitreomacular traction; MH Macular hole;  NVD neovascularization of the disc; NVE neovascularization elsewhere; AREDS age related eye disease study; ARMD age related macular degeneration; POAG primary open angle glaucoma; EBMD epithelial/anterior basement membrane dystrophy; ACIOL anterior chamber intraocular lens; IOL intraocular lens; PCIOL posterior chamber intraocular lens; Phaco/IOL phacoemulsification with intraocular lens placement; [place] photorefractive keratectomy; LASIK laser assisted in situ keratomileusis; HTN hypertension; DM diabetes mellitus; COPD chronic obstructive pulmonary disease

## 2020-04-18 ENCOUNTER — Encounter (INDEPENDENT_AMBULATORY_CARE_PROVIDER_SITE_OTHER): Payer: Self-pay | Admitting: Ophthalmology

## 2020-04-18 ENCOUNTER — Other Ambulatory Visit: Payer: Self-pay

## 2020-04-18 ENCOUNTER — Ambulatory Visit (INDEPENDENT_AMBULATORY_CARE_PROVIDER_SITE_OTHER): Payer: Medicare Other | Admitting: Ophthalmology

## 2020-04-18 DIAGNOSIS — H3581 Retinal edema: Secondary | ICD-10-CM | POA: Diagnosis not present

## 2020-04-18 DIAGNOSIS — H353211 Exudative age-related macular degeneration, right eye, with active choroidal neovascularization: Secondary | ICD-10-CM | POA: Diagnosis not present

## 2020-04-18 DIAGNOSIS — E119 Type 2 diabetes mellitus without complications: Secondary | ICD-10-CM | POA: Diagnosis not present

## 2020-04-18 DIAGNOSIS — I10 Essential (primary) hypertension: Secondary | ICD-10-CM

## 2020-04-18 DIAGNOSIS — H353122 Nonexudative age-related macular degeneration, left eye, intermediate dry stage: Secondary | ICD-10-CM | POA: Diagnosis not present

## 2020-04-18 DIAGNOSIS — H35033 Hypertensive retinopathy, bilateral: Secondary | ICD-10-CM

## 2020-04-18 DIAGNOSIS — Z961 Presence of intraocular lens: Secondary | ICD-10-CM

## 2020-04-18 DIAGNOSIS — H401130 Primary open-angle glaucoma, bilateral, stage unspecified: Secondary | ICD-10-CM

## 2020-04-19 ENCOUNTER — Encounter (INDEPENDENT_AMBULATORY_CARE_PROVIDER_SITE_OTHER): Payer: Self-pay | Admitting: Ophthalmology

## 2020-04-19 DIAGNOSIS — I10 Essential (primary) hypertension: Secondary | ICD-10-CM | POA: Diagnosis not present

## 2020-04-19 DIAGNOSIS — H353211 Exudative age-related macular degeneration, right eye, with active choroidal neovascularization: Secondary | ICD-10-CM

## 2020-04-20 MED ORDER — BEVACIZUMAB CHEMO INJECTION 1.25MG/0.05ML SYRINGE FOR KALEIDOSCOPE
1.2500 mg | INTRAVITREAL | Status: AC | PRN
  Administered 2020-04-19: 1.25 mg via INTRAVITREAL

## 2020-05-03 DIAGNOSIS — I10 Essential (primary) hypertension: Secondary | ICD-10-CM | POA: Diagnosis not present

## 2020-05-03 DIAGNOSIS — H353122 Nonexudative age-related macular degeneration, left eye, intermediate dry stage: Secondary | ICD-10-CM | POA: Diagnosis not present

## 2020-05-03 DIAGNOSIS — I2510 Atherosclerotic heart disease of native coronary artery without angina pectoris: Secondary | ICD-10-CM | POA: Diagnosis not present

## 2020-05-03 DIAGNOSIS — H353211 Exudative age-related macular degeneration, right eye, with active choroidal neovascularization: Secondary | ICD-10-CM | POA: Diagnosis not present

## 2020-05-06 ENCOUNTER — Telehealth: Payer: Self-pay | Admitting: Cardiovascular Disease

## 2020-05-06 NOTE — Telephone Encounter (Signed)
Returned call to pt she states that she is taking hydralazine but not taking the losartan. She states that her BP is running 110/56. She will take and log her BO BID to see how it is running. She has appt with PharMD 11-9 she will bring log to give to Dr Oval Linsey for review at that time. Verbalizes understanding.

## 2020-05-06 NOTE — Telephone Encounter (Signed)
Pt c/o medication issue:  1. Name of Medication: Losartan  2. How are you currently taking this medication (dosage and times per day)? N/A  3. Are you having a reaction (difficulty breathing--STAT)? no  4. What is your medication issue? Caren Griffins from the Chronic Care Team in [place] is calling to make sure that the patient is not taking Losartan with Hydralazine. I did not see Losartan on the patients medication list.

## 2020-05-13 ENCOUNTER — Ambulatory Visit (INDEPENDENT_AMBULATORY_CARE_PROVIDER_SITE_OTHER): Payer: Medicare Other | Admitting: Pharmacist

## 2020-05-13 VITALS — BP 128/70 | HR 89 | Resp 14 | Ht 62.25 in | Wt 178.2 lb

## 2020-05-13 DIAGNOSIS — E7849 Other hyperlipidemia: Secondary | ICD-10-CM

## 2020-05-13 DIAGNOSIS — I10 Essential (primary) hypertension: Secondary | ICD-10-CM

## 2020-05-13 MED ORDER — PRALUENT 75 MG/ML ~~LOC~~ SOAJ: 75.0000 mg | SUBCUTANEOUS | 0 refills | Status: DC

## 2020-05-13 NOTE — Progress Notes (Signed)
HPI:  [name] is a 79 y.o. female patient of Dr Oval Linsey, who presents today for a lipid and HTN follow up.  See pertinent past medical history below.   She was started on Repatha about 2 years ago, but was only able to take for 3-4 doses due to it causing her to have flares of bronchitis, of which she is apparently prone.  She was also intolerant to Nexlizet and multiple statins.     Patient BP therapy reported dizziness in the past and her hydralazine was discontinued. On re-assessment by Dr Oval Linsey, stopping hydralazine did NOT improved dizziness and his BP when back up to 140s; therefore hydralazine was resumed at 25mg  twice daily on 04/11/2020.   Past Medical History: ASCVD 50% LAD  HFpEF Grade 2 chronic  hypertension Well controlled  - on carvedilol, hydralazine  Hx of DVT On chronic warfarin  DM2 06/2019 A1c 8.5 - on glimepiride, sitagliptin     Current Medications:   Carvedilol 6.25 mg twice daily Hydralazine 25 mg twice daily Furosemide 40mg  daily  No medications for cholesterol at this time  BP goal: 70mg /dL  Cholesterol Goals: LDL < 70   Intolerant/previously tried: repatha - bronchitis, pravastatin, rosuvastatin - myalgias  Family history: maternal grandmother with multiple MI's died from stroke in her 6's; brother with MI died at 29  Diet: mostly home cooked, appetite not as good in past few months  Exercise:  Household chores  Home BP readings:   10 readings, average 112/72, HR 64-87  Labs:  10/05/19:  TC 225, TG 189, HDL 43, LDL 148  Current Outpatient Medications  Medication Sig Dispense Refill  . albuterol (PROVENTIL HFA;VENTOLIN HFA) 108 (90 BASE) MCG/ACT inhaler Inhale 2 puffs into the lungs every 4 (four) hours as needed for shortness of breath. 1 Inhaler 0  . Alirocumab (PRALUENT) 75 MG/ML SOAJ Inject 75 mg into the skin every 14 (fourteen) days. 1 mL 0  . amitriptyline (ELAVIL) 25 MG tablet Take 50 mg by mouth at bedtime.     . Black Cohosh  40 MG CAPS Take 40 mg by mouth 2 (two) times daily.    . carvedilol (COREG) 6.25 MG tablet TAKE 1 TABLET BY MOUTH  TWICE DAILY 180 tablet 3  . cetirizine (ZYRTEC) 10 MG tablet Take 10 mg by mouth daily.    . Cholecalciferol (VITAMIN D-3) 1000 units CAPS Take 1 capsule by mouth daily.    . diazepam (VALIUM) 2 MG tablet Take by mouth.    . dorzolamide-timolol (COSOPT) 22.3-6.8 MG/ML ophthalmic solution Place 1 drop into the right eye 2 (two) times daily. 10 mL 6  . esomeprazole (NEXIUM) 20 MG capsule Take 20 mg by mouth daily at 12 noon.    . furosemide (LASIX) 40 MG tablet TAKE 1 TABLET (40 MG TOTAL) BY MOUTH DAILY. MAY TAKE EXTRA DAILY AS NEEDED FOR SWELLING 180 tablet 1  . gabapentin (NEURONTIN) 300 MG capsule Take 300 mg by mouth 3 (three) times daily.    [name] glimepiride (AMARYL) 2 MG tablet Take 2 mg by mouth daily.  2  . hydrALAZINE (APRESOLINE) 25 MG tablet Take 1 tablet (25 mg total) by mouth in the morning and at bedtime. 180 tablet 3  . isosorbide mononitrate (IMDUR) 30 MG 24 hr tablet TAKE 1 TABLET BY MOUTH  DAILY 90 tablet 3  . latanoprost (XALATAN) 0.005 % ophthalmic solution Place 1 drop into both eyes at bedtime.     . meclizine (ANTIVERT) 25 MG tablet  Take 25 mg by mouth as needed for dizziness.     . Omega-3 Fatty Acids (FISH OIL) 1200 MG CAPS Take 1 capsule by mouth 2 (two) times daily.    . polyethylene glycol (MIRALAX / GLYCOLAX) packet Take 17 g by mouth daily.    . potassium chloride SA (KLOR-CON) 20 MEQ tablet TAKE ONE-HALF TABLET BY  MOUTH DAILY 45 tablet 3  . sitaGLIPtin (JANUVIA) 50 MG tablet Take 1 tablet by mouth daily.    . vitamin B-12 (CYANOCOBALAMIN) 100 MCG tablet Take 1 tablet by mouth daily.    [name] warfarin (COUMADIN) 1 MG tablet Take 1 mg by mouth daily.  11  . tetrahydrozoline 0.05 % ophthalmic solution Place 1 drop into both eyes at bedtime.      No current facility-administered medications for this visit.    Allergies  Allergen Reactions  . Vioxx  [Rofecoxib] Shortness Of Breath  . Metformin And Related     Kidney failure  . Nexlizet [Bempedoic Acid-Ezetimibe]     Causes elevated Liver and Kidney function  . Penicillins     rash  . Pravastatin   . Codeine Rash  . Motrin [Ibuprofen] Rash  . Sulfur Rash    Past Medical History:  Diagnosis Date  . Antral gastritis    EGD 11/15  . Asthmatic bronchitis   . Back pain   . Chronic diastolic heart failure (HCC) 05/20/2015   Grade 2 diastolic dysfunction.  04/2015.  [name] Chronic kidney disease    kidney function low  . Diabetes mellitus    x 5 yrs  . DVT of axillary vein, acute left ([place]) 07/24/12  . GERD (gastroesophageal reflux disease)   . Glaucoma    POAG OU  . Hyperlipidemia 05/20/2015  . Hypertension   . Hypertensive retinopathy    OU  . Hypothyroidism   . Kidney stones   . Macular degeneration    Wet OD, Dry OS  . Mixed hyperlipidemia   . Peripheral venous insufficiency   . Pinched nerve    right elbow  . Sigmoid diverticulitis   . Vertigo    chonic    Blood pressure 128/70, pulse 89, resp. rate 14, height 5' 2.25" (1.581 m), weight 178 lb 3.2 oz (80.8 kg), SpO2 94 %.   Hyperlipidemia LDL well above goal for secondary prevention and intolerant to statins, Repatha, and Nexlizet. Praluent 75mg  sample provided today(lot [id number], exp 10/2021). Patient will contact clinic to let us know if bale to tolerate Praluent, then we will work on insurance prior authorization process if needed. Plan to repeat fasting blood work after 4th dose of new medication   HTN (hypertension) BP at goal today and at home. Patient reports compliance, and denies issues with dizziness, swelling, lighheadedness or increase fatigue. Will continue current therapy and follow up as needed.    Omarie Parcell Rodriguez-Guzman PharmD, BCPS, [place] [address] 05/21/2020 2:41 PM

## 2020-05-13 NOTE — Patient Instructions (Addendum)
Return for a a follow up appointment AS NEEDED  Check your blood pressure at home daily (if able) and keep record of the readings.  Take your BP meds as follows: *NO CHANGES*  *Call lipid clinic at [phone] (Ardath Lepak/Kristin/[place]) to let us know if able to tolerate Praluent 75mg *

## 2020-05-16 NOTE — Telephone Encounter (Signed)
Caren Griffins from Chronic Care Team calling back to clarify the patient's medications.

## 2020-05-16 NOTE — Telephone Encounter (Signed)
[id number] 

## 2020-05-16 NOTE — Telephone Encounter (Signed)
Caren Griffins is returning call. She is requesting to confirm that patient is no longer taking Losartan. Please call.  Phone Number#: [phone]

## 2020-05-16 NOTE — Telephone Encounter (Signed)
Spoke to [place], aware patient is not taking losartan.  Nothing further needed

## 2020-05-21 ENCOUNTER — Encounter: Payer: Self-pay | Admitting: Pharmacist

## 2020-05-21 NOTE — Assessment & Plan Note (Addendum)
LDL well above goal for secondary prevention and intolerant to statins, Repatha, and Nexlizet. Praluent 75mg  sample provided today(lot [id number], exp 10/2021). Patient will contact clinic to let us know if bale to tolerate Praluent, then we will work on insurance prior authorization process if needed. Plan to repeat fasting blood work after 4th dose of new medication

## 2020-05-21 NOTE — Assessment & Plan Note (Signed)
BP at goal today and at home. Patient reports compliance, and denies issues with dizziness, swelling, lighheadedness or increase fatigue. Will continue current therapy and follow up as needed.

## 2020-05-23 DIAGNOSIS — Z7901 Long term (current) use of anticoagulants: Secondary | ICD-10-CM | POA: Diagnosis not present

## 2020-05-23 DIAGNOSIS — E1165 Type 2 diabetes mellitus with hyperglycemia: Secondary | ICD-10-CM | POA: Diagnosis not present

## 2020-05-23 DIAGNOSIS — D249 Benign neoplasm of unspecified breast: Secondary | ICD-10-CM | POA: Diagnosis not present

## 2020-05-23 DIAGNOSIS — I10 Essential (primary) hypertension: Secondary | ICD-10-CM | POA: Diagnosis not present

## 2020-05-23 DIAGNOSIS — E042 Nontoxic multinodular goiter: Secondary | ICD-10-CM | POA: Diagnosis not present

## 2020-05-23 DIAGNOSIS — D34 Benign neoplasm of thyroid gland: Secondary | ICD-10-CM | POA: Diagnosis not present

## 2020-06-03 DIAGNOSIS — H353122 Nonexudative age-related macular degeneration, left eye, intermediate dry stage: Secondary | ICD-10-CM | POA: Diagnosis not present

## 2020-06-03 DIAGNOSIS — N200 Calculus of kidney: Secondary | ICD-10-CM | POA: Diagnosis not present

## 2020-06-03 DIAGNOSIS — M5030 Other cervical disc degeneration, unspecified cervical region: Secondary | ICD-10-CM | POA: Diagnosis not present

## 2020-06-03 DIAGNOSIS — I10 Essential (primary) hypertension: Secondary | ICD-10-CM | POA: Diagnosis not present

## 2020-06-03 DIAGNOSIS — E042 Nontoxic multinodular goiter: Secondary | ICD-10-CM | POA: Diagnosis not present

## 2020-06-03 DIAGNOSIS — H353211 Exudative age-related macular degeneration, right eye, with active choroidal neovascularization: Secondary | ICD-10-CM | POA: Diagnosis not present

## 2020-06-03 DIAGNOSIS — D34 Benign neoplasm of thyroid gland: Secondary | ICD-10-CM | POA: Diagnosis not present

## 2020-06-03 DIAGNOSIS — I2510 Atherosclerotic heart disease of native coronary artery without angina pectoris: Secondary | ICD-10-CM | POA: Diagnosis not present

## 2020-06-03 DIAGNOSIS — E1165 Type 2 diabetes mellitus with hyperglycemia: Secondary | ICD-10-CM | POA: Diagnosis not present

## 2020-06-04 ENCOUNTER — Telehealth: Payer: Self-pay

## 2020-06-04 NOTE — Telephone Encounter (Signed)
[facility]le dpt to see if they tolerated the praluent and they stated that they did so I will submit a pa for praluent 75

## 2020-06-04 NOTE — Telephone Encounter (Signed)
-----   Message from Harrington Challenger, [place] sent at 06/04/2020  4:03 PM EST ----- Regarding: FW: Lipid Please call patient and check if tolerated Praluent sample provided. If able to tolerate, she will need new PA for insurance.   Unable to tolerate Repatha & Nexletol  ----- Message ----- From: Harrington Challenger, RPH-CPP Sent: 06/04/2020 To: Roxanne Mins Rodriguez-Guzman, RPH-CPP Subject: Lipid                                          Tolerated Praluent 75mg  sample?

## 2020-06-05 ENCOUNTER — Telehealth: Payer: Self-pay

## 2020-06-05 DIAGNOSIS — E7849 Other hyperlipidemia: Secondary | ICD-10-CM

## 2020-06-05 MED ORDER — PRALUENT 75 MG/ML ~~LOC~~ SOAJ: 75.0000 mg | SUBCUTANEOUS | 11 refills | Status: DC

## 2020-06-05 NOTE — Progress Notes (Signed)
[name]  06/10/2020     CHIEF COMPLAINT Patient presents for Retina Follow Up   HISTORY OF PRESENT ILLNESS: [name] is a 79 y.o. female who presents to the clinic today for:   HPI    Retina Follow Up    Patient presents with  Wet AMD.  In right eye.  This started 7 weeks ago.  I, the attending physician,  performed the HPI with the patient and updated documentation appropriately.          Comments    Patient here for 7 weeks retina follow up for exu ARMD OD.Patient states vision OD is a little blurry. OS is not. No eye pain. Added cholesterol injection.       Last edited by Bernarda Caffey, MD on 06/10/2020 11:55 PM. (History)    pt states right eye is blurry, but OS seems okay  Referring physician: Sharilyn Sites, MD [address] [place],   [address]  HISTORICAL INFORMATION:   Selected notes from the MEDICAL RECORD NUMBER Referred by Dr. Madelin Headings for concern of SRF OD LEE: 11.27.20 (M. Cotter) [BCVA: OD: 20/80-- OS: 20/60-]  Ocular Hx-glaucoma (latanoprost)  PMH-DM    CURRENT MEDICATIONS: Current Outpatient Medications (Ophthalmic Drugs)  Medication Sig  . dorzolamide-timolol (COSOPT) 22.3-6.8 MG/ML ophthalmic solution Place 1 drop into the right eye 2 (two) times daily.  [name] latanoprost (XALATAN) 0.005 % ophthalmic solution Place 1 drop into both eyes at bedtime.   [name] tetrahydrozoline 0.05 % ophthalmic solution Place 1 drop into both eyes at bedtime.    No current facility-administered medications for this visit. (Ophthalmic Drugs)   Current Outpatient Medications (Other)  Medication Sig  . albuterol (PROVENTIL HFA;VENTOLIN HFA) 108 (90 BASE) MCG/ACT inhaler Inhale 2 puffs into the lungs every 4 (four) hours as needed for shortness of breath.  . Alirocumab (PRALUENT) 75 MG/ML SOAJ Inject 75 mg into the skin every 14 (fourteen) days.  [name] amitriptyline (ELAVIL) 25 MG tablet Take 50 mg by mouth at bedtime.   .  Black Cohosh 40 MG CAPS Take 40 mg by mouth 2 (two) times daily.  . carvedilol (COREG) 6.25 MG tablet TAKE 1 TABLET BY MOUTH  TWICE DAILY  . cetirizine (ZYRTEC) 10 MG tablet Take 10 mg by mouth daily.  . Cholecalciferol (VITAMIN D-3) 1000 units CAPS Take 1 capsule by mouth daily.  . diazepam (VALIUM) 2 MG tablet Take by mouth.  . esomeprazole (NEXIUM) 20 MG capsule Take 20 mg by mouth daily at 12 noon.  . furosemide (LASIX) 40 MG tablet TAKE 1 TABLET (40 MG TOTAL) BY MOUTH DAILY. MAY TAKE EXTRA DAILY AS NEEDED FOR SWELLING  . gabapentin (NEURONTIN) 300 MG capsule Take 300 mg by mouth 3 (three) times daily.  [name] glimepiride (AMARYL) 2 MG tablet Take 2 mg by mouth daily.  . hydrALAZINE (APRESOLINE) 25 MG tablet Take 1 tablet (25 mg total) by mouth in the morning and at bedtime.  . isosorbide mononitrate (IMDUR) 30 MG 24 hr tablet TAKE 1 TABLET BY MOUTH  DAILY  . meclizine (ANTIVERT) 25 MG tablet Take 25 mg by mouth as needed for dizziness.   . Omega-3 Fatty Acids (FISH OIL) 1200 MG CAPS Take 1 capsule by mouth 2 (two) times daily.  . polyethylene glycol (MIRALAX / GLYCOLAX) packet Take 17 g by mouth daily.  . potassium chloride SA (KLOR-CON) 20 MEQ tablet TAKE ONE-HALF TABLET BY  MOUTH DAILY  . sitaGLIPtin (JANUVIA) 50  MG tablet Take 1 tablet by mouth daily.  . vitamin B-12 (CYANOCOBALAMIN) 100 MCG tablet Take 1 tablet by mouth daily.  [name] warfarin (COUMADIN) 1 MG tablet Take 1 mg by mouth daily.   No current facility-administered medications for this visit. (Other)      REVIEW OF SYSTEMS: ROS    Positive for: Genitourinary, Endocrine, Cardiovascular, Eyes   Negative for: Constitutional, Gastrointestinal, Neurological, Skin, Musculoskeletal, HENT, Respiratory, Psychiatric, Allergic/Imm, Heme/Lymph   Last edited by Theodore Demark, COA on 06/10/2020  2:28 PM. (History)       ALLERGIES Allergies  Allergen Reactions  . Vioxx [Rofecoxib] Shortness Of Breath  . Metformin And Related      Kidney failure  . Nexlizet [Bempedoic Acid-Ezetimibe]     Causes elevated Liver and Kidney function  . Penicillins     rash  . Pravastatin   . Repatha [Evolocumab]     MYALGIAS  . Codeine Rash  . Motrin [Ibuprofen] Rash  . Sulfur Rash    PAST MEDICAL HISTORY Past Medical History:  Diagnosis Date  . Antral gastritis    EGD 11/15  . Asthmatic bronchitis   . Back pain   . Chronic diastolic heart failure (HCC) 05/20/2015   Grade 2 diastolic dysfunction.  04/2015.  [name] Chronic kidney disease    kidney function low  . Diabetes mellitus    x 5 yrs  . DVT of axillary vein, acute left ([place]) 07/24/12  . GERD (gastroesophageal reflux disease)   . Glaucoma    POAG OU  . Hyperlipidemia 05/20/2015  . Hypertension   . Hypertensive retinopathy    OU  . Hypothyroidism   . Kidney stones   . Macular degeneration    Wet OD, Dry OS  . Mixed hyperlipidemia   . Peripheral venous insufficiency   . Pinched nerve    right elbow  . Sigmoid diverticulitis   . Vertigo    chonic   Past Surgical History:  Procedure Laterality Date  . ABDOMINAL HYSTERECTOMY    . BACK SURGERY     spinal   . CARDIAC CATHETERIZATION N/A 05/26/2015   Procedure: Left Heart Cath and Coronary Angiography;  Surgeon: Jettie Booze, MD;  Location: [facility];  Service: Cardiovascular;  Laterality: N/A;  . CATARACT EXTRACTION Bilateral   . CHOLECYSTECTOMY    . COLON SURGERY    . COLONOSCOPY N/A 12/27/2013   Procedure: COLONOSCOPY;  Surgeon: Rogene Houston, MD;  Location: [facility];  Service: Endoscopy;  Laterality: N/A;  200  . COLOSTOMY CLOSURE    . ESOPHAGOGASTRODUODENOSCOPY N/A 05/07/2014   Procedure: ESOPHAGOGASTRODUODENOSCOPY (EGD);  Surgeon: Rogene Houston, MD;  Location: [facility];  Service: Endoscopy;  Laterality: N/A;  . EYE SURGERY Bilateral    Cat Sx  . fracture left foot    . HERNIA REPAIR    . NM MYOCAR PERF WALL MOTION  01/28/2009   Normal  . OTHER SURGICAL HISTORY      colostomy, colostomy reversal, for diverticulitis surgical hernia repair, arm surgery, neck surgery  . US ECHOCARDIOGRAPHY  02/11/2010   Mild MR,trace TR & AI    FAMILY HISTORY Family History  Problem Relation Age of Onset  . Other Mother 61       Cause unknown  . Cancer Mother        liver  . CVA Maternal Grandmother 90       deceased  . Heart disease Maternal Grandmother   . Stroke Maternal  Grandmother   . Heart attack Brother 61       deceased  . Cancer Sister 48       deceased  . Glaucoma Maternal Uncle     SOCIAL HISTORY Social History   Tobacco Use  . Smoking status: Former Smoker    Types: Cigarettes    Quit date: 12/10/2013    Years since quitting: 6.5  . Smokeless tobacco: Never Used  . Tobacco comment: Smoke 1-1 1/2 packs a day  Substance Use Topics  . Alcohol use: No  . Drug use: No         OPHTHALMIC EXAM:  Base Eye Exam    Visual Acuity (Snellen - Linear)      Right Left   Dist [place] 20/60 -1 20/40 -1   Dist ph [place] 20/40 20/30 -1       Tonometry (Tonopen, 2:25 PM)      Right Left   Pressure 16 15       Pupils      Dark Light Shape React APD   Right 2 1 Round Brisk None   Left 2 1 Round Brisk None       Visual Fields (Counting fingers)      Left Right    Full Full       Extraocular Movement      Right Left    Full, Ortho Full, Ortho       Neuro/Psych    Oriented x3: Yes   Mood/Affect: Normal       Dilation    Both eyes: 1.0% Mydriacyl, 2.5% Phenylephrine @ 2:25 PM        Slit Lamp and Fundus Exam    Slit Lamp Exam      Right Left   Lids/Lashes Dermatochalasis - upper lid, Meibomian gland dysfunction Dermatochalasis - upper lid, Telangiectasia, mild Meibomian gland dysfunction   Conjunctiva/Sclera White and quiet White and quiet   Cornea 2+ inferior Punctate epithelial erosions, no epi defect 1+ inferior Punctate epithelial erosions   Anterior Chamber Deep and quiet, narrow temporal angle Deep and quiet, narrow temporal angle    Iris Round and dilated Round and moderately dilated to 5.4mm   Lens Posterior chamber intraocular lens Posterior chamber intraocular lens   Vitreous Vitreous syneresis Vitreous syneresis       Fundus Exam      Right Left   Disc Sharp rim, Pallor, +cupping, mild, temporal Peripapillary atrophy, superior rim thinning Pink and Sharp, temporal Peripapillary atrophy   C/D Ratio 0.75 0.6   Macula Flat, Blunted foveal reflex, +focal CNV/PED nasal macula with partial pigment ring, stable improvement in SRF/IRF/cystic changes, Drusen, no heme, RPE mottling and clumping Flat, Blunted foveal reflex, drusen, Retinal pigment epithelial mottling and clumping, No heme or edema   Vessels Vascular attenuation, Tortuous Vascular attenuation, Tortuous   Periphery Attached; no heme Attached; no heme          IMAGING AND PROCEDURES  Imaging and Procedures for @TODAY @  OCT, Retina - OU - Both Eyes       Right Eye Quality was good. Central Foveal Thickness: 199. Progression has been stable. Findings include intraretinal hyper-reflective material, pigment epithelial detachment, subretinal hyper-reflective material, retinal drusen , normal foveal contour, outer retinal atrophy, no IRF, subretinal fluid (Stable improvement in IRF/cystic changes overlying PED nasal macula, trace SRF).   Left Eye Quality was good. Central Foveal Thickness: 212. Progression has been stable. Findings include normal foveal contour, no IRF, no SRF, retinal  drusen .   Notes *Images captured and stored on drive  Diagnosis / Impression:  OD: exudative ARMD; stable improvement in IRF/cystic changes overlying PED nasal macula, trace SRF OS: NFP, no IRF/SRF; +drusen -- nonexudative ARMD  Clinical management:  See below  Abbreviations: NFP - Normal foveal profile. CME - cystoid macular edema. PED - pigment epithelial detachment. IRF - intraretinal fluid. SRF - subretinal fluid. EZ - ellipsoid zone. ERM - epiretinal membrane.  ORA - outer retinal atrophy. ORT - outer retinal tubulation. SRHM - subretinal hyper-reflective material        Intravitreal Injection, Pharmacologic Agent - OD - Right Eye       Time Out 06/10/2020. 3:39 PM. Confirmed correct patient, procedure, site, and patient consented.   Anesthesia Topical anesthesia was used. Anesthetic medications included Lidocaine 2%, Proparacaine 0.5%.   Procedure Preparation included 5% betadine to ocular surface, eyelid speculum. A (32g) needle was used.   Injection:  1.25 mg Bevacizumab (AVASTIN) 1.25mg /0.52mL SOLN   NDC: 33295-188-41, Lot: [id number], Expiration date: 06/26/2020   Route: Intravitreal, Site: Right Eye, Waste: 0.05 mL  Post-op Post injection exam found visual acuity of at least counting fingers. The patient tolerated the procedure well. There were no complications. The patient received written and verbal post procedure care education. Post injection medications were not given.   Notes                  ASSESSMENT/PLAN:    ICD-10-CM   1. Exudative age-related macular degeneration of right eye with active choroidal neovascularization (HCC)  H35.3211 Intravitreal Injection, Pharmacologic Agent - OD - Right Eye    Bevacizumab (AVASTIN) SOLN 1.25 mg  2. Retinal edema  H35.81 OCT, Retina - OU - Both Eyes  3. Intermediate stage nonexudative age-related macular degeneration of left eye  H35.3122   4. Diabetes mellitus type 2 without retinopathy ([place])  E11.9   5. Essential hypertension  I10   6. Hypertensive retinopathy of both eyes  H35.033   7. Pseudophakia of both eyes  Z96.1   8. Primary open angle glaucoma of both eyes, unspecified glaucoma stage  H40.1130    1,2. 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, OD    - delayed follow up from 4 weeks to 8 weeks due to passing of husband (12.11.20-02.12.21)  - s/p IVA OD #1 (12.11.20), #2 (02.12.21), #3 (03.12.21), #4 (04.09.21), #5 (05.14.21), #6 (06.17.21), #7 (07.23.21), #8  (10.15.21)  - FA 12.11.20 confirms +CNVM  - OCT today shows stable improvement in IRF overlying nasal PED, trace SRF at 7 wks  - BCVA OD 20/40  - recommend IVA OD #9 today, 12.07.21 -- maintenance w/ f/u at 7 wks (no ext today)  - pt wishes to proceed  - RBA of procedure discussed, questions answered  - informed consent obtained and signed  - see procedure [name]  - Avastin informed consent form signed and scanned on 12.11.2020 (OD)  - of [name], pt presented acutely on 03.16.21 due to decreased vision after injection OD -- ?post injection epi defect -- no epi defect today             - Continue to use AT OD  - f/u in 7 wks -- DFE/OCT/possible injection; treat and extend as able  3. Age related macular degeneration, non-exudative, OS  - intermediate stage  - The incidence, anatomy, and pathology of dry AMD, risk of progression, and the AREDS and AREDS 2 study including smoking risks discussed with patient.  - Recommend amsler  grid monitoring  4. Diabetes mellitus, type 2 without retinopathy  - The incidence, risk factors for progression, natural history and treatment options for diabetic retinopathy  were discussed with patient.    - The need for close monitoring of blood glucose, blood pressure, and serum lipids, avoiding cigarette or any type of tobacco, and the need for long term follow up was also discussed with patient.  - monitor  5,6. Hypertensive retinopathy OU  - discussed importance of tight BP control  - monitor  7. Pseudophakia OU  - s/p CE/IOL (Dr. Venetia Maxon)  - beautiful surgery, doing well  - monitor  8. POAG OU  - formerly managed by Dr. Venetia Maxon  - s/p laser w/ Dr. Venetia Maxon -- ?SLT  - IOP 16,15   - currently on latanoprost QHS OU  - cont Cosopt BID OD only  - monitor  Ophthalmic Meds Ordered this visit:  Meds ordered this encounter  Medications  . Bevacizumab (AVASTIN) SOLN 1.25 mg      Return in about 7 weeks (around 07/29/2020) for f/u exu ARMD OD, DFE,  OCT.  There are no Patient Instructions on file for this visit.   Explained the diagnoses, plan, and follow up with the patient and they expressed understanding.  Patient expressed understanding of the importance of proper follow up care.   This document serves as a record of services personally performed by Gardiner Sleeper, MD, PhD. It was created on their behalf by Estill Bakes, COT an ophthalmic technician. The creation of this record is the provider's dictation and/or activities during the visit.    Electronically signed by: Estill Bakes, COT 12.2.21 @ 11:59 PM   This document serves as a record of services personally performed by Gardiner Sleeper, MD, PhD. It was created on their behalf by San Jetty. Owens Shark, OA an ophthalmic technician. The creation of this record is the provider's dictation and/or activities during the visit.    Electronically signed by: San Jetty. Marguerita Merles 12.07.2021 11:59 PM  Gardiner Sleeper, M.D., Ph.D. Diseases & Surgery of the Retina and [place] 06/10/2020   I have reviewed the above documentation for accuracy and completeness, and I agree with the above. Gardiner Sleeper, M.D., Ph.D. 06/10/20 11:59 PM   Abbreviations: M myopia (nearsighted); A astigmatism; H hyperopia (farsighted); P presbyopia; Mrx spectacle prescription;  CTL contact lenses; OD right eye; OS left eye; OU both eyes  XT exotropia; ET esotropia; PEK punctate epithelial keratitis; PEE punctate epithelial erosions; DES dry eye syndrome; MGD meibomian gland dysfunction; ATs artificial tears; PFAT's preservative free artificial tears; [place] nuclear sclerotic cataract; PSC posterior subcapsular cataract; ERM epi-retinal membrane; PVD posterior vitreous detachment; RD retinal detachment; DM diabetes mellitus; DR diabetic retinopathy; NPDR non-proliferative diabetic retinopathy; PDR proliferative diabetic retinopathy; CSME clinically significant macular edema; DME diabetic  macular edema; dbh dot blot hemorrhages; CWS cotton wool spot; POAG primary open angle glaucoma; C/D cup-to-disc ratio; HVF humphrey visual field; GVF goldmann visual field; OCT optical coherence tomography; IOP intraocular pressure; BRVO Branch retinal vein occlusion; CRVO central retinal vein occlusion; CRAO central retinal artery occlusion; BRAO branch retinal artery occlusion; RT retinal tear; SB scleral buckle; PPV pars plana vitrectomy; VH Vitreous hemorrhage; PRP panretinal laser photocoagulation; IVK intravitreal kenalog; VMT vitreomacular traction; MH Macular hole;  NVD neovascularization of the disc; NVE neovascularization elsewhere; AREDS age related eye disease study; ARMD age related macular degeneration; POAG primary open angle glaucoma; EBMD epithelial/anterior basement membrane dystrophy; ACIOL anterior  chamber intraocular lens; IOL intraocular lens; PCIOL posterior chamber intraocular lens; Phaco/IOL phacoemulsification with intraocular lens placement; Ucon photorefractive keratectomy; LASIK laser assisted in situ keratomileusis; HTN hypertension; DM diabetes mellitus; COPD chronic obstructive pulmonary disease

## 2020-06-05 NOTE — Telephone Encounter (Signed)
Called and spoke w/pt regardig the approval of the praluent 75, rx sent, labs ordered, instructed pt to come fasting and no appt needed. Pt voiced understanding

## 2020-06-06 ENCOUNTER — Encounter (INDEPENDENT_AMBULATORY_CARE_PROVIDER_SITE_OTHER): Payer: Medicare Other | Admitting: Ophthalmology

## 2020-06-10 ENCOUNTER — Encounter (INDEPENDENT_AMBULATORY_CARE_PROVIDER_SITE_OTHER): Payer: Self-pay | Admitting: Ophthalmology

## 2020-06-10 ENCOUNTER — Other Ambulatory Visit: Payer: Self-pay

## 2020-06-10 ENCOUNTER — Ambulatory Visit (INDEPENDENT_AMBULATORY_CARE_PROVIDER_SITE_OTHER): Payer: Medicare Other | Admitting: Ophthalmology

## 2020-06-10 DIAGNOSIS — H353122 Nonexudative age-related macular degeneration, left eye, intermediate dry stage: Secondary | ICD-10-CM

## 2020-06-10 DIAGNOSIS — I10 Essential (primary) hypertension: Secondary | ICD-10-CM

## 2020-06-10 DIAGNOSIS — E119 Type 2 diabetes mellitus without complications: Secondary | ICD-10-CM

## 2020-06-10 DIAGNOSIS — H353211 Exudative age-related macular degeneration, right eye, with active choroidal neovascularization: Secondary | ICD-10-CM | POA: Diagnosis not present

## 2020-06-10 DIAGNOSIS — H35033 Hypertensive retinopathy, bilateral: Secondary | ICD-10-CM

## 2020-06-10 DIAGNOSIS — H401130 Primary open-angle glaucoma, bilateral, stage unspecified: Secondary | ICD-10-CM

## 2020-06-10 DIAGNOSIS — Z961 Presence of intraocular lens: Secondary | ICD-10-CM

## 2020-06-10 DIAGNOSIS — H3581 Retinal edema: Secondary | ICD-10-CM | POA: Diagnosis not present

## 2020-06-10 MED ORDER — BEVACIZUMAB CHEMO INJECTION 1.25MG/0.05ML SYRINGE FOR KALEIDOSCOPE
1.2500 mg | INTRAVITREAL | Status: AC | PRN
  Administered 2020-06-10: 1.25 mg via INTRAVITREAL

## 2020-06-24 ENCOUNTER — Other Ambulatory Visit: Payer: Self-pay

## 2020-06-24 ENCOUNTER — Ambulatory Visit: Payer: Medicare Other | Admitting: Cardiovascular Disease

## 2020-06-24 ENCOUNTER — Encounter: Payer: Self-pay | Admitting: Cardiovascular Disease

## 2020-06-24 ENCOUNTER — Ambulatory Visit (INDEPENDENT_AMBULATORY_CARE_PROVIDER_SITE_OTHER): Payer: Medicare Other | Admitting: Pharmacist Clinician (PhC)/ Clinical Pharmacy Specialist

## 2020-06-24 VITALS — BP 128/78 | HR 84 | Ht 62.25 in | Wt 178.0 lb

## 2020-06-24 DIAGNOSIS — I825Z9 Chronic embolism and thrombosis of unspecified deep veins of unspecified distal lower extremity: Secondary | ICD-10-CM

## 2020-06-24 DIAGNOSIS — I5032 Chronic diastolic (congestive) heart failure: Secondary | ICD-10-CM | POA: Diagnosis not present

## 2020-06-24 DIAGNOSIS — R040 Epistaxis: Secondary | ICD-10-CM | POA: Diagnosis not present

## 2020-06-24 DIAGNOSIS — I10 Essential (primary) hypertension: Secondary | ICD-10-CM | POA: Diagnosis not present

## 2020-06-24 DIAGNOSIS — I82A12 Acute embolism and thrombosis of left axillary vein: Secondary | ICD-10-CM

## 2020-06-24 DIAGNOSIS — E7849 Other hyperlipidemia: Secondary | ICD-10-CM | POA: Diagnosis not present

## 2020-06-24 DIAGNOSIS — Z7901 Long term (current) use of anticoagulants: Secondary | ICD-10-CM

## 2020-06-24 LAB — POCT INR: INR: 2.8 (ref 2.0–3.0)

## 2020-06-24 NOTE — Patient Instructions (Addendum)
Medication Instructions:  Your physician recommends that you continue on your current medications as directed. Please refer to the Current Medication list given to you today.  *If you need a refill on your cardiac medications before your next appointment, please call your pharmacy*  Lab Work: NONE   Testing/Procedures: NONE  Follow-Up: At [facility], you and your health needs are our priority.  As part of our continuing mission to provide you with exceptional heart care, we have created designated Provider Care Teams.  These Care Teams include your primary Cardiologist (physician) and Advanced Practice Providers (APPs -  Physician Assistants and Nurse Practitioners) who all work together to provide you with the care you need, when you need it.  We recommend signing up for the patient portal called "MyChart".  Sign up information is provided on this After Visit Summary.  MyChart is used to connect with patients for Virtual Visits (Telemedicine).  Patients are able to view lab/test results, encounter notes, upcoming appointments, etc.  Non-urgent messages can be sent to your provider as well.   To learn more about what you can do with MyChart, go to [email or web address].    Your next appointment:   6 month(s)  The format for your next appointment:   In Person  Provider:   You may see DR Ch [facility]  or one of the following Advanced Practice Providers on your designated Care Team:    Kerin Ransom, PA-C  [place], [place]  Coletta Memos, [place] [facility] OFFICE FOR COUMADIN CHECK 07/22/2020 AT 9:30 AM IF YOU NEED TO RESCHEDULE CALL THE OFFICE AT [phone]

## 2020-06-24 NOTE — Progress Notes (Signed)
Cardiology Office Note   Date:  06/24/2020   ID:  [name], DOB [birth year], MRN [id number]  PCP:  Sharilyn Sites, MD  Cardiologist:   Skeet Latch, MD   No chief complaint on file.    History of Present Illness: [name] is a 79 y.o. female with CAD (50% LAD), chronic diastolic heart failure (grade 2), hypertension, hyperlipidemia, prior DVT on warfarin, and diabetes type 2 who presents for follow up.  She was first seen 04/2015 at which time she reported occasional chest pain.  She had an exercise Myoview 04/2015 that showed LVEF 78% with a small defect of moderate severity in the mid anterior and apical anterior region.  This was felt to be due to breast attenuation artifact.  However, she underwent cardiac catheterization on 05/26/15 that revealed a 50% LAD lesion. There was concern that there may be a component of vasospasm so long-acting nitrates were started. Her blood pressure and hyperlipidemia medications have been titrated due to poor control.  She is also on Lasix due to lower extremity edema.  Ms. Ehresman' husband died of COVID double pneumonia on 07/2019.  He was treated with Remdesivir at [facility] outpatient infusion center.  She was experiencing atypical chest pain.  She was referred for [facility] that revealed LVEF 74% with no ischemia.  Since then she called our office with concern for dizziness.  She spoke with our pharmacist and was advised to stop her hydralazine.  However her dizziness did not improve and it was not thought to be due to the medication or her blood pressure.  She had an episode while sitting on the toilet had to grab the garbage can to vomit.  They recommended that she see her PCP.  She had vertigo in the past and has used meclizine.  Her LFTs were elevated so her PCP stopped her pravastatin.  She since started Praluent.  She had a couple episodes of epistaxis last week.  They occurred during her sleep and were hard to stop.  She fell a couple  times and feels off balance at times.  She did not hit her head.  She has been more active but not getting formal exercise.  Her BP at home has been stable.  She gets stressed by driving here and not having anyone with her.  She has some sock imprint but no significant edema, orthopnea or PND.   Past Medical History:  Diagnosis Date  . Antral gastritis    EGD 11/15  . Asthmatic bronchitis   . Back pain   . Chronic diastolic heart failure (HCC) 05/20/2015   Grade 2 diastolic dysfunction.  04/2015.  [name] Chronic kidney disease    kidney function low  . Diabetes mellitus    x 5 yrs  . DVT of axillary vein, acute left ([place]) 07/24/12  . GERD (gastroesophageal reflux disease)   . Glaucoma    POAG OU  . Hyperlipidemia 05/20/2015  . Hypertension   . Hypertensive retinopathy    OU  . Hypothyroidism   . Kidney stones   . Macular degeneration    Wet OD, Dry OS  . Mixed hyperlipidemia   . Peripheral venous insufficiency   . Pinched nerve    right elbow  . Sigmoid diverticulitis   . Vertigo    chonic    Past Surgical History:  Procedure Laterality Date  . ABDOMINAL HYSTERECTOMY    . BACK SURGERY     spinal   . CARDIAC  CATHETERIZATION N/A 05/26/2015   Procedure: Left Heart Cath and Coronary Angiography;  Surgeon: Jettie Booze, MD;  Location: [facility];  Service: Cardiovascular;  Laterality: N/A;  . CATARACT EXTRACTION Bilateral   . CHOLECYSTECTOMY    . COLON SURGERY    . COLONOSCOPY N/A 12/27/2013   Procedure: COLONOSCOPY;  Surgeon: Rogene Houston, MD;  Location: [facility];  Service: Endoscopy;  Laterality: N/A;  200  . COLOSTOMY CLOSURE    . ESOPHAGOGASTRODUODENOSCOPY N/A 05/07/2014   Procedure: ESOPHAGOGASTRODUODENOSCOPY (EGD);  Surgeon: Rogene Houston, MD;  Location: [facility];  Service: Endoscopy;  Laterality: N/A;  . EYE SURGERY Bilateral    Cat Sx  . fracture left foot    . HERNIA REPAIR    . NM MYOCAR PERF WALL MOTION  01/28/2009   Normal  . OTHER  SURGICAL HISTORY     colostomy, colostomy reversal, for diverticulitis surgical hernia repair, arm surgery, neck surgery  . US ECHOCARDIOGRAPHY  02/11/2010   Mild MR,trace TR & AI     Current Outpatient Medications  Medication Sig Dispense Refill  . albuterol (PROVENTIL HFA;VENTOLIN HFA) 108 (90 BASE) MCG/ACT inhaler Inhale 2 puffs into the lungs every 4 (four) hours as needed for shortness of breath. 1 Inhaler 0  . Alirocumab (PRALUENT) 75 MG/ML SOAJ Inject 75 mg into the skin every 14 (fourteen) days. 2 mL 11  . amitriptyline (ELAVIL) 25 MG tablet Take 50 mg by mouth at bedtime.     . Black Cohosh 40 MG CAPS Take 40 mg by mouth 2 (two) times daily.    . carvedilol (COREG) 6.25 MG tablet TAKE 1 TABLET BY MOUTH  TWICE DAILY 180 tablet 3  . cetirizine (ZYRTEC) 10 MG tablet Take 10 mg by mouth daily.    . Cholecalciferol (VITAMIN D-3) 1000 units CAPS Take 1 capsule by mouth daily.    . diazepam (VALIUM) 2 MG tablet Take by mouth.    . dorzolamide-timolol (COSOPT) 22.3-6.8 MG/ML ophthalmic solution Place 1 drop into the right eye 2 (two) times daily. 10 mL 6  . esomeprazole (NEXIUM) 20 MG capsule Take 20 mg by mouth daily at 12 noon.    . furosemide (LASIX) 40 MG tablet TAKE 1 TABLET (40 MG TOTAL) BY MOUTH DAILY. MAY TAKE EXTRA DAILY AS NEEDED FOR SWELLING 180 tablet 1  . gabapentin (NEURONTIN) 300 MG capsule Take 300 mg by mouth 3 (three) times daily.    [name] glimepiride (AMARYL) 2 MG tablet Take 2 mg by mouth daily.  2  . hydrALAZINE (APRESOLINE) 25 MG tablet Take 1 tablet (25 mg total) by mouth in the morning and at bedtime. 180 tablet 3  . isosorbide mononitrate (IMDUR) 30 MG 24 hr tablet TAKE 1 TABLET BY MOUTH  DAILY 90 tablet 3  . latanoprost (XALATAN) 0.005 % ophthalmic solution Place 1 drop into both eyes at bedtime.     . meclizine (ANTIVERT) 25 MG tablet Take 25 mg by mouth as needed for dizziness.     . Omega-3 Fatty Acids (FISH OIL) 1200 MG CAPS Take 1 capsule by mouth 2 (two) times  daily.    . polyethylene glycol (MIRALAX / GLYCOLAX) packet Take 17 g by mouth daily.    . potassium chloride SA (KLOR-CON) 20 MEQ tablet TAKE ONE-HALF TABLET BY  MOUTH DAILY 45 tablet 3  . tetrahydrozoline 0.05 % ophthalmic solution Place 1 drop into both eyes at bedtime.     . vitamin B-12 (CYANOCOBALAMIN) 100 MCG  tablet Take 1 tablet by mouth daily.    [name] warfarin (COUMADIN) 2 MG tablet Take 2 mg by mouth as directed.     No current facility-administered medications for this visit.    Allergies:   Vioxx [rofecoxib], Metformin and related, Nexlizet [bempedoic acid-ezetimibe], Penicillins, Pravastatin, Repatha [evolocumab], Codeine, Motrin [ibuprofen], and Sulfur    Social History:  The patient  reports that she quit smoking about 6 years ago. Her smoking use included cigarettes. She has never used smokeless tobacco. She reports that she does not drink alcohol and does not use drugs.   Family History:  The patient's family history includes CVA (age of onset: 21) in her maternal grandmother; Cancer in her mother; Cancer (age of onset: 45) in her sister; Glaucoma in her maternal uncle; Heart attack (age of onset: 82) in her brother; Heart disease in her maternal grandmother; Other (age of onset: 7) in her mother; Stroke in her maternal grandmother.    ROS:  Please see the history of present illness.   Otherwise, review of systems are positive for hiatal hernia, GERD    All other systems are reviewed and negative.    PHYSICAL EXAM: VS:  BP 128/78   Pulse 84   Ht 5' 2.25" (1.581 m)   Wt 178 lb (80.7 kg)   SpO2 94%   BMI 32.30 kg/m  , BMI Body mass index is 32.3 kg/m. GENERAL:  Well appearing HEENT: Pupils equal round and reactive, fundi not visualized, oral mucosa unremarkable NECK:  No jugular venous distention, waveform within normal limits, carotid upstroke brisk and symmetric, no bruit LUNGS:  Clear to auscultation bilaterally HEART:  RRR.  PMI not displaced or sustained,S1 and S2  within normal limits, no S3, no S4, no clicks, no rubs, no murmurs ABD:  Flat, positive bowel sounds normal in frequency in pitch, no bruits, no rebound, no guarding, no midline pulsatile mass, no hepatomegaly, no splenomegaly EXT:  2 plus pulses throughout, trace LE edema, no cyanosis no clubbing SKIN:  No rashes no nodules NEURO:  Cranial nerves II through XII grossly intact, motor grossly intact throughout PSYCH:  Cognitively intact, oriented to person place and time   EKG:  EKG is not ordered today. 02/16/16: Sinus rhythm rate 83 bpm.  PAC.  RBBB.  LPFB. 03/02/17: Sinus rhythm.  RBBB.  LPFB 09/15/17: Sinus rhythm.  Rate 64 bpm. RBBB  09/28/2019: Sinus rhythm.  Rate 75 bpm.  RBBB. 04/11/20: Sinus rhythm.  Rate 75 bpm.  RBBB.    Lexiscan Myoview 10/2019:  Nuclear stress EF: 74%. The left ventricular ejection fraction is hyperdynamic (>65%).  There was no ST segment deviation noted during stress.  This is a low risk study. There is no evidence of ischemia or previous infarction.  The study is normal.  Exercise Myoview 04/30/15  The left ventricular ejection fraction is hyperdynamic (>65%).  Nuclear stress EF: 78%.  Blood pressure demonstrated a hypertensive response to exercise.  Upsloping ST segment depression ST segment depression was noted during stress in the III, II and aVF leads. This is not diagnostic for ischemia.  Defect 1: There is a small defect of moderate severity present in the mid anterior and apical anterior location. Wall motion is normal, so this is likely artifact due to breast attenuation. However, cannot rule out LAD ischemia.  This is a low risk study.  Cardiac cath 05/26/15:  Mid LAD lesion, 50% stenosed. FFR of this lesion showed value of 0.85. This is felt to be nonsignificant.  Normal LVEDP.  Continue aggressive medical therapy. Would consider adding long-acting nitrate as the patient may have some component of vasospasm. Of note, in the future , if  repeat catheterization was needed , would consider using a 5 [place] guide catheter as her left main is very short and the catheter tends to deep seat Into either the LAD or the circumflex.  Recent Labs: 10/05/2019: ALT 26; BUN 24; Creatinine, Ser 1.30; Potassium 4.2; Sodium 141   03/12/15: INR 2.9   Lipid Panel    Component Value Date/Time   CHOL 225 (H) 10/05/2019 0859   TRIG 189 (H) 10/05/2019 0859   HDL 43 10/05/2019 0859   CHOLHDL 5.2 (H) 10/05/2019 0859   CHOLHDL 4.9 08/17/2016 0914   VLDL 33 (H) 08/17/2016 0914   LDLCALC 148 (H) 10/05/2019 0859      Wt Readings from Last 3 Encounters:  06/24/20 178 lb (80.7 kg)  05/13/20 178 lb 3.2 oz (80.8 kg)  04/11/20 178 lb (80.7 kg)      ASSESSMENT AND PLAN:  # Non-obstructive CAD:  # Hyperlipidemia: # Chest pain: She is no longer having chest pain.  She had 50% LAD stenosis.  Lexiscan Myoview was negative 10/2019.  It seems that her chest pain has been more related to the stress of her husband dying and has since resolved.  Continue carvedilol, and Imdur.  Her statin was held due to transaminitis.  She is tolerating Praluent well.    # Chronic diastolic heart failure: # Hypertension:  Continue lasix, carvedilol, and losartan.  Hydralazine was discontinued when she was dizzy.  However stopping it did not improve her dizziness and it was actually due to BPPV.  We will restart hydralazine 25 mg twice daily.  She will track her pressures at home.  # DVT:  Continue warfarin.  INR was 2.7 today.  She wants to follow with our coumadin clinic in [place] so that she can get Accuchecks instead of venous draws.   Current medicines are reviewed at length with the patient today.  The patient does not have concerns regarding medicines.  The following changes have been made:  no change  Labs/ tests ordered today include:   No orders of the defined types were placed in this encounter.  Time spent: 3 minutes-Greater than 50% of this time was  spent in counseling, explanation of diagnosis, planning of further management, and coordination of care.    Disposition:   FU with Ruby Logiudice C. Oval Linsey, MD in 6 months.     Signed, Skeet Latch, MD  06/24/2020 12:53 PM    [facility]

## 2020-07-04 DIAGNOSIS — H353122 Nonexudative age-related macular degeneration, left eye, intermediate dry stage: Secondary | ICD-10-CM | POA: Diagnosis not present

## 2020-07-04 DIAGNOSIS — H353211 Exudative age-related macular degeneration, right eye, with active choroidal neovascularization: Secondary | ICD-10-CM | POA: Diagnosis not present

## 2020-07-04 DIAGNOSIS — I10 Essential (primary) hypertension: Secondary | ICD-10-CM | POA: Diagnosis not present

## 2020-07-04 DIAGNOSIS — I2510 Atherosclerotic heart disease of native coronary artery without angina pectoris: Secondary | ICD-10-CM | POA: Diagnosis not present

## 2020-07-23 DIAGNOSIS — J449 Chronic obstructive pulmonary disease, unspecified: Secondary | ICD-10-CM | POA: Diagnosis not present

## 2020-07-23 DIAGNOSIS — H6091 Unspecified otitis externa, right ear: Secondary | ICD-10-CM | POA: Diagnosis not present

## 2020-07-23 DIAGNOSIS — M26602 Left temporomandibular joint disorder, unspecified: Secondary | ICD-10-CM | POA: Diagnosis not present

## 2020-07-24 NOTE — Progress Notes (Signed)
[facility] Note  07/29/2020     CHIEF COMPLAINT Patient presents for Retina Follow Up   HISTORY OF PRESENT ILLNESS: [name] is a 80 y.o. female who presents to the clinic today for:   HPI    Retina Follow Up    Patient presents with  Wet AMD.  In right eye.  Severity is moderate.  Duration of 7 weeks.  Since onset it is stable.  I, the attending physician,  performed the HPI with the patient and updated documentation appropriately.          Comments    Patient states vision the same OU. Hasn't checked BS. Doesn't remember last a1c.        Last edited by Bernarda Caffey, MD on 07/29/2020 12:29 PM. (History)      Referring physician: Sharilyn Sites, MD [address] [place],  [place] [address]  HISTORICAL INFORMATION:   Selected notes from the MEDICAL RECORD NUMBER Referred by Dr. Madelin Headings for concern of SRF OD LEE: 11.27.20 (M. Cotter) [BCVA: OD: 20/80-- OS: 20/60-]  Ocular Hx-glaucoma (latanoprost)  PMH-DM    CURRENT MEDICATIONS: Current Outpatient Medications (Ophthalmic Drugs)  Medication Sig  . dorzolamide-timolol (COSOPT) 22.3-6.8 MG/ML ophthalmic solution Place 1 drop into the right eye 2 (two) times daily.  [name] latanoprost (XALATAN) 0.005 % ophthalmic solution Place 1 drop into both eyes at bedtime.   [name] tetrahydrozoline 0.05 % ophthalmic solution Place 1 drop into both eyes at bedtime.    No current facility-administered medications for this visit. (Ophthalmic Drugs)   Current Outpatient Medications (Other)  Medication Sig  . albuterol (PROVENTIL HFA;VENTOLIN HFA) 108 (90 BASE) MCG/ACT inhaler Inhale 2 puffs into the lungs every 4 (four) hours as needed for shortness of breath.  . Alirocumab (PRALUENT) 75 MG/ML SOAJ Inject 75 mg into the skin every 14 (fourteen) days.  [name] amitriptyline (ELAVIL) 25 MG tablet Take 50 mg by mouth at bedtime.   . Black Cohosh 40 MG CAPS Take 40 mg by mouth 2 (two) times daily.  . carvedilol  (COREG) 6.25 MG tablet TAKE 1 TABLET BY MOUTH  TWICE DAILY  . cetirizine (ZYRTEC) 10 MG tablet Take 10 mg by mouth daily.  . Cholecalciferol (VITAMIN D-3) 1000 units CAPS Take 1 capsule by mouth daily.  . diazepam (VALIUM) 2 MG tablet Take by mouth.  . esomeprazole (NEXIUM) 20 MG capsule Take 20 mg by mouth daily at 12 noon.  . furosemide (LASIX) 40 MG tablet TAKE 1 TABLET (40 MG TOTAL) BY MOUTH DAILY. MAY TAKE EXTRA DAILY AS NEEDED FOR SWELLING  . gabapentin (NEURONTIN) 300 MG capsule Take 300 mg by mouth 3 (three) times daily.  [name] glimepiride (AMARYL) 2 MG tablet Take 2 mg by mouth daily.  . hydrALAZINE (APRESOLINE) 25 MG tablet Take 1 tablet (25 mg total) by mouth in the morning and at bedtime.  . isosorbide mononitrate (IMDUR) 30 MG 24 hr tablet TAKE 1 TABLET BY MOUTH  DAILY  . meclizine (ANTIVERT) 25 MG tablet Take 25 mg by mouth as needed for dizziness.   . Omega-3 Fatty Acids (FISH OIL) 1200 MG CAPS Take 1 capsule by mouth 2 (two) times daily.  . polyethylene glycol (MIRALAX / GLYCOLAX) packet Take 17 g by mouth daily.  . potassium chloride SA (KLOR-CON) 20 MEQ tablet TAKE ONE-HALF TABLET BY  MOUTH DAILY  . vitamin B-12 (CYANOCOBALAMIN) 100 MCG tablet Take 1 tablet by mouth daily.  [name] warfarin (COUMADIN) 2  MG tablet Take 2 mg by mouth as directed.   No current facility-administered medications for this visit. (Other)      REVIEW OF SYSTEMS: ROS    Positive for: Genitourinary, Endocrine, Cardiovascular, Eyes   Negative for: Constitutional, Gastrointestinal, Neurological, Skin, Musculoskeletal, HENT, Respiratory, Psychiatric, Allergic/Imm, Heme/Lymph   Last edited by Roselee Nova D, COT on 07/29/2020  9:29 AM. (History)       ALLERGIES Allergies  Allergen Reactions  . Vioxx [Rofecoxib] Shortness Of Breath  . Metformin And Related     Kidney failure  . Nexlizet [Bempedoic Acid-Ezetimibe]     Causes elevated Liver and Kidney function  . Penicillins     rash  . Pravastatin    . Repatha [Evolocumab]     MYALGIAS  . Codeine Rash  . Elemental Sulfur Rash  . Motrin [Ibuprofen] Rash    PAST MEDICAL HISTORY Past Medical History:  Diagnosis Date  . Antral gastritis    EGD 11/15  . Asthmatic bronchitis   . Back pain   . Chronic diastolic heart failure (HCC) 05/20/2015   Grade 2 diastolic dysfunction.  04/2015.  [name] Chronic kidney disease    kidney function low  . Diabetes mellitus    x 5 yrs  . DVT of axillary vein, acute left ([place]) 07/24/12  . GERD (gastroesophageal reflux disease)   . Glaucoma    POAG OU  . Hyperlipidemia 05/20/2015  . Hypertension   . Hypertensive retinopathy    OU  . Hypothyroidism   . Kidney stones   . Macular degeneration    Wet OD, Dry OS  . Mixed hyperlipidemia   . Peripheral venous insufficiency   . Pinched nerve    right elbow  . Sigmoid diverticulitis   . Vertigo    chonic   Past Surgical History:  Procedure Laterality Date  . ABDOMINAL HYSTERECTOMY    . BACK SURGERY     spinal   . CARDIAC CATHETERIZATION N/A 05/26/2015   Procedure: Left Heart Cath and Coronary Angiography;  Surgeon: Jettie Booze, MD;  Location: [facility];  Service: Cardiovascular;  Laterality: N/A;  . CATARACT EXTRACTION Bilateral   . CHOLECYSTECTOMY    . COLON SURGERY    . COLONOSCOPY N/A 12/27/2013   Procedure: COLONOSCOPY;  Surgeon: Rogene Houston, MD;  [facility]: [facility];  Service: Endoscopy;  Laterality: N/A;  200  . COLOSTOMY CLOSURE    . ESOPHAGOGASTRODUODENOSCOPY N/A 05/07/2014   Procedure: ESOPHAGOGASTRODUODENOSCOPY (EGD);  Surgeon: Rogene Houston, MD;  Location: [facility];  Service: Endoscopy;  Laterality: N/A;  . EYE SURGERY Bilateral    Cat Sx  . fracture left foot    . HERNIA REPAIR    . NM MYOCAR PERF WALL MOTION  01/28/2009   Normal  . OTHER SURGICAL HISTORY     colostomy, colostomy reversal, for diverticulitis surgical hernia repair, arm surgery, neck surgery  . US ECHOCARDIOGRAPHY  02/11/2010    Mild MR,trace TR & AI    FAMILY HISTORY Family History  Problem Relation Age of Onset  . Other Mother 23       Cause unknown  . Cancer Mother        liver  . CVA Maternal Grandmother 90       deceased  . Heart disease Maternal Grandmother   . Stroke Maternal Grandmother   . Heart attack Brother 79       deceased  . Cancer Sister 79  deceased  . Glaucoma Maternal Uncle     SOCIAL HISTORY Social History   Tobacco Use  . Smoking status: Former Smoker    Types: Cigarettes    Quit date: 12/10/2013    Years since quitting: 6.6  . Smokeless tobacco: Never Used  . Tobacco comment: Smoke 1-1 1/2 packs a day  Substance Use Topics  . Alcohol use: No  . Drug use: No         OPHTHALMIC EXAM:  Base Eye Exam    Visual Acuity (Snellen - Linear)      Right Left   Dist [place] 20/40 +2 20/30 -2   Dist ph [place] 20/30 -2 20/25 -2       Tonometry (Tonopen, 9:43 AM)      Right Left   Pressure 22 20       Pupils      Dark Light Shape React APD   Right 2 1 Round Brisk None   Left 2 1 Round Brisk None       Visual Fields (Counting fingers)      Left Right    Full Full       Extraocular Movement      Right Left    Full, Ortho Full, Ortho       Neuro/Psych    Oriented x3: Yes   Mood/Affect: Normal        Slit Lamp and Fundus Exam    Slit Lamp Exam      Right Left   Lids/Lashes Dermatochalasis - upper lid, Meibomian gland dysfunction Dermatochalasis - upper lid, Telangiectasia, mild Meibomian gland dysfunction   Conjunctiva/Sclera White and quiet White and quiet   Cornea 2+ inferior Punctate epithelial erosions, no epi defect 1+ inferior Punctate epithelial erosions   Anterior Chamber Deep and quiet, narrow temporal angle Deep and quiet, narrow temporal angle   Iris Round and dilated Round and moderately dilated to 5.48mm   Lens Posterior chamber intraocular lens Posterior chamber intraocular lens   Vitreous Vitreous syneresis Vitreous syneresis       Fundus Exam       Right Left   Disc Sharp rim, Pallor, +cupping, mild, temporal Peripapillary atrophy, superior rim thinning Pink and Sharp, temporal Peripapillary atrophy   C/D Ratio 0.75 0.6   Macula Flat, Blunted foveal reflex, +focal CNV/PED nasal macula with partial pigment ring, stable improvement in SRF/IRF/cystic changes, Drusen, no heme, RPE mottling and clumping Flat, Blunted foveal reflex, drusen, Retinal pigment epithelial mottling and clumping, No heme or edema   Vessels Vascular attenuation, Tortuous Vascular attenuation, Tortuous   Periphery Attached; no heme Attached; no heme          IMAGING AND PROCEDURES  Imaging and Procedures for @TODAY @  OCT, Retina - OU - Both Eyes       Right Eye Quality was good. Central Foveal Thickness: 200. Progression has been stable. Findings include intraretinal hyper-reflective material, pigment epithelial detachment, subretinal hyper-reflective material, retinal drusen , normal foveal contour, outer retinal atrophy, no IRF, subretinal fluid (Stable improvement in IRF/cystic changes overlying PED nasal macula, trace residual SRF).   Left Eye Quality was good. Central Foveal Thickness: 214. Progression has been stable. Findings include normal foveal contour, no IRF, no SRF, retinal drusen .   Notes *Images captured and stored on drive  Diagnosis / Impression:  OD: exudative ARMD; stable improvement in IRF/cystic changes overlying PED nasal macula, trace residual SRF OS: NFP, no IRF/SRF; +drusen -- nonexudative ARMD  Clinical management:  See below  Abbreviations: NFP - Normal foveal profile. CME - cystoid macular edema. PED - pigment epithelial detachment. IRF - intraretinal fluid. SRF - subretinal fluid. EZ - ellipsoid zone. ERM - epiretinal membrane. ORA - outer retinal atrophy. ORT - outer retinal tubulation. SRHM - subretinal hyper-reflective material        Intravitreal Injection, Pharmacologic Agent - OD - Right Eye       Time  Out 07/29/2020. 10:43 AM. Confirmed correct patient, procedure, site, and patient consented.   Anesthesia Topical anesthesia was used. Anesthetic medications included Lidocaine 2%, Proparacaine 0.5%.   Procedure Preparation included 5% betadine to ocular surface, eyelid speculum. A supplied needle was used.   Injection:  1.25 mg Bevacizumab (AVASTIN) 1.25mg /0.41mL SOLN   NDC: 54008-676-19, Lot: [id number] , Expiration date: 09/10/2020   Route: Intravitreal, Site: Right Eye, Waste: 0 mL  Post-op Post injection exam found visual acuity of at least counting fingers. The patient tolerated the procedure well. There were no complications. The patient received written and verbal post procedure care education. Post injection medications were not given.   Notes                  ASSESSMENT/PLAN:    ICD-10-CM   1. Exudative age-related macular degeneration of right eye with active choroidal neovascularization (HCC)  H35.3211 Intravitreal Injection, Pharmacologic Agent - OD - Right Eye    Bevacizumab (AVASTIN) SOLN 1.25 mg  2. Retinal edema  H35.81 OCT, Retina - OU - Both Eyes  3. Intermediate stage nonexudative age-related macular degeneration of left eye  H35.3122   4. Diabetes mellitus type 2 without retinopathy ([place])  E11.9   5. Essential hypertension  I10   6. Hypertensive retinopathy of both eyes  H35.033   7. Pseudophakia of both eyes  Z96.1   8. Primary open angle glaucoma of both eyes, unspecified glaucoma stage  H40.1130    1,2. 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, OD    - (12.11.20-02.12.21) delayed follow up from 4 weeks to 8 weeks due to passing of husband   - s/p IVA OD #1 (12.11.20), #2 (02.12.21), #3 (03.12.21), #4 (04.09.21), #5 (05.14.21), #6 (06.17.21), #7 (07.23.21), #8 (10.15.21), #9 (12.07.21)  - FA 12.11.20 confirms +CNVM  - OCT today shows stable improvement in IRF overlying nasal PED, trace residual SRF at 7 wks x2  - BCVA OD 20/30-2 (improved from  20/40)  - recommend IVA OD #10 today, 01.25.22 -- maintenance w/ f/u at 8 wks (slow tx and ext)  - pt wishes to proceed  - RBA of procedure discussed, questions answered  - informed consent obtained and signed  - see procedure note  - Avastin informed consent form signed and scanned on 12.11.2020 (OD)  - of note, pt presented acutely on 03.16.21 due to decreased vision after injection OD -- ?post injection epi defect -- no epi defect today             - Continue to use AT OD  - f/u in 8 wks -- DFE/OCT/possible injection; treat and extend as able  3. Age related macular degeneration, non-exudative, OS  - intermediate stage  - The incidence, anatomy, and pathology of dry AMD, risk of progression, and the AREDS and AREDS 2 study including smoking risks discussed with patient.  - Recommend amsler grid monitoring  4. Diabetes mellitus, type 2 without retinopathy  - The incidence, risk factors for progression, natural history and treatment options for diabetic retinopathy  were discussed with patient.    -  The need for close monitoring of blood glucose, blood pressure, and serum lipids, avoiding cigarette or any type of tobacco, and the need for long term follow up was also discussed with patient.  - monitor  5,6. Hypertensive retinopathy OU  - discussed importance of tight BP control  - monitor  7. Pseudophakia OU  - s/p CE/IOL (Dr. Venetia Maxon)  - beautiful surgery, doing well  - monitor  8. POAG OU  - formerly managed by Dr. Venetia Maxon  - s/p laser w/ Dr. Venetia Maxon -- ?SLT  - IOP 22, 20             - IOP spike likely caused by pt being on prednisone for ear infection  - currently on latanoprost QHS OU  - cont Cosopt BID OD only  - monitor  Ophthalmic Meds Ordered this visit:  Meds ordered this encounter  Medications  . Bevacizumab (AVASTIN) SOLN 1.25 mg      Return in about 8 weeks (around 09/23/2020) for 8 wk f/u for exu ARMD OD w/DFE/OCT/likely inj..  There are no Patient  Instructions on file for this visit.  This document serves as a record of services personally performed by Gardiner Sleeper, MD, PhD. It was created on their behalf by Leeann Must, [place], an ophthalmic technician. The creation of this record is the provider's dictation and/or activities during the visit.    Electronically signed by: Leeann Must, [place] 1.21.22 @ 12:33 PM  This document serves as a record of services personally performed by Gardiner Sleeper, MD, PhD. It was created on their behalf by Estill Bakes, COT an ophthalmic technician. The creation of this record is the provider's dictation and/or activities during the visit.    Electronically signed by: Estill Bakes, COT 1.25.22 @ 12:33 PM  Gardiner Sleeper, M.D., Ph.D. Diseases & Surgery of the Retina and [place] 07/29/2020   I have reviewed the above documentation for accuracy and completeness, and I agree with the above. Gardiner Sleeper, M.D., Ph.D. 07/29/20 12:33 PM  Abbreviations: M myopia (nearsighted); A astigmatism; H hyperopia (farsighted); P presbyopia; Mrx spectacle prescription;  CTL contact lenses; OD right eye; OS left eye; OU both eyes  XT exotropia; ET esotropia; PEK punctate epithelial keratitis; PEE punctate epithelial erosions; DES dry eye syndrome; MGD meibomian gland dysfunction; ATs artificial tears; PFAT's preservative free artificial tears; [place] nuclear sclerotic cataract; PSC posterior subcapsular cataract; ERM epi-retinal membrane; PVD posterior vitreous detachment; RD retinal detachment; DM diabetes mellitus; DR diabetic retinopathy; NPDR non-proliferative diabetic retinopathy; PDR proliferative diabetic retinopathy; CSME clinically significant macular edema; DME diabetic macular edema; dbh dot blot hemorrhages; CWS cotton wool spot; POAG primary open angle glaucoma; C/D cup-to-disc ratio; HVF humphrey visual field; GVF goldmann visual field; OCT optical coherence tomography; IOP  intraocular pressure; BRVO Branch retinal vein occlusion; CRVO central retinal vein occlusion; CRAO central retinal artery occlusion; BRAO branch retinal artery occlusion; RT retinal tear; SB scleral buckle; PPV pars plana vitrectomy; VH Vitreous hemorrhage; PRP panretinal laser photocoagulation; IVK intravitreal kenalog; VMT vitreomacular traction; MH Macular hole;  NVD neovascularization of the disc; NVE neovascularization elsewhere; AREDS age related eye disease study; ARMD age related macular degeneration; POAG primary open angle glaucoma; EBMD epithelial/anterior basement membrane dystrophy; ACIOL anterior chamber intraocular lens; IOL intraocular lens; PCIOL posterior chamber intraocular lens; Phaco/IOL phacoemulsification with intraocular lens placement; [place] photorefractive keratectomy; LASIK laser assisted in situ keratomileusis; HTN hypertension; DM diabetes mellitus; COPD chronic obstructive pulmonary disease

## 2020-07-29 ENCOUNTER — Ambulatory Visit (INDEPENDENT_AMBULATORY_CARE_PROVIDER_SITE_OTHER): Payer: Medicare Other | Admitting: Ophthalmology

## 2020-07-29 ENCOUNTER — Encounter (INDEPENDENT_AMBULATORY_CARE_PROVIDER_SITE_OTHER): Payer: Self-pay | Admitting: Ophthalmology

## 2020-07-29 ENCOUNTER — Other Ambulatory Visit: Payer: Self-pay

## 2020-07-29 DIAGNOSIS — Z961 Presence of intraocular lens: Secondary | ICD-10-CM | POA: Diagnosis not present

## 2020-07-29 DIAGNOSIS — I10 Essential (primary) hypertension: Secondary | ICD-10-CM

## 2020-07-29 DIAGNOSIS — E119 Type 2 diabetes mellitus without complications: Secondary | ICD-10-CM | POA: Diagnosis not present

## 2020-07-29 DIAGNOSIS — H353211 Exudative age-related macular degeneration, right eye, with active choroidal neovascularization: Secondary | ICD-10-CM | POA: Diagnosis not present

## 2020-07-29 DIAGNOSIS — H35033 Hypertensive retinopathy, bilateral: Secondary | ICD-10-CM | POA: Diagnosis not present

## 2020-07-29 DIAGNOSIS — H353122 Nonexudative age-related macular degeneration, left eye, intermediate dry stage: Secondary | ICD-10-CM | POA: Diagnosis not present

## 2020-07-29 DIAGNOSIS — H401130 Primary open-angle glaucoma, bilateral, stage unspecified: Secondary | ICD-10-CM | POA: Diagnosis not present

## 2020-07-29 DIAGNOSIS — H3581 Retinal edema: Secondary | ICD-10-CM

## 2020-07-29 MED ORDER — BEVACIZUMAB CHEMO INJECTION 1.25MG/0.05ML SYRINGE FOR KALEIDOSCOPE
1.2500 mg | INTRAVITREAL | Status: AC | PRN
  Administered 2020-07-29: 1.25 mg via INTRAVITREAL

## 2020-07-30 ENCOUNTER — Ambulatory Visit (INDEPENDENT_AMBULATORY_CARE_PROVIDER_SITE_OTHER): Payer: Medicare Other | Admitting: *Deleted

## 2020-07-30 DIAGNOSIS — I82A12 Acute embolism and thrombosis of left axillary vein: Secondary | ICD-10-CM | POA: Diagnosis not present

## 2020-07-30 DIAGNOSIS — Z5181 Encounter for therapeutic drug level monitoring: Secondary | ICD-10-CM

## 2020-07-30 LAB — POCT INR: INR: 3.2 — AB (ref 2.0–3.0)

## 2020-07-30 NOTE — Patient Instructions (Signed)
Hold warfarin tonight then resume 1 tablet daily Recheck in 3 weeks

## 2020-08-02 DIAGNOSIS — I10 Essential (primary) hypertension: Secondary | ICD-10-CM | POA: Diagnosis not present

## 2020-08-02 DIAGNOSIS — E1165 Type 2 diabetes mellitus with hyperglycemia: Secondary | ICD-10-CM | POA: Diagnosis not present

## 2020-08-02 DIAGNOSIS — I2510 Atherosclerotic heart disease of native coronary artery without angina pectoris: Secondary | ICD-10-CM | POA: Diagnosis not present

## 2020-08-02 DIAGNOSIS — H353211 Exudative age-related macular degeneration, right eye, with active choroidal neovascularization: Secondary | ICD-10-CM | POA: Diagnosis not present

## 2020-08-19 ENCOUNTER — Other Ambulatory Visit (HOSPITAL_COMMUNITY): Payer: Self-pay | Admitting: Family Medicine

## 2020-08-19 DIAGNOSIS — Z1231 Encounter for screening mammogram for malignant neoplasm of breast: Secondary | ICD-10-CM

## 2020-08-19 DIAGNOSIS — E2839 Other primary ovarian failure: Secondary | ICD-10-CM

## 2020-08-19 DIAGNOSIS — L729 Follicular cyst of the skin and subcutaneous tissue, unspecified: Secondary | ICD-10-CM | POA: Diagnosis not present

## 2020-08-20 ENCOUNTER — Ambulatory Visit (INDEPENDENT_AMBULATORY_CARE_PROVIDER_SITE_OTHER): Payer: Medicare Other | Admitting: *Deleted

## 2020-08-20 DIAGNOSIS — Z5181 Encounter for therapeutic drug level monitoring: Secondary | ICD-10-CM | POA: Diagnosis not present

## 2020-08-20 DIAGNOSIS — I82A12 Acute embolism and thrombosis of left axillary vein: Secondary | ICD-10-CM | POA: Diagnosis not present

## 2020-08-20 LAB — POCT INR: INR: 3.9 — AB (ref 2.0–3.0)

## 2020-08-20 NOTE — Patient Instructions (Signed)
Hold warfarin tonight then decrease dose to 1 tablet daily except 1/2 tablet on Tuesdays and Fridays Recheck in 2 weeks Starting Augmentin today for cellulitis lt arm  Had steroid shot yesterday

## 2020-08-25 ENCOUNTER — Other Ambulatory Visit: Payer: Self-pay | Admitting: Cardiovascular Disease

## 2020-08-25 ENCOUNTER — Telehealth: Payer: Self-pay | Admitting: Cardiovascular Disease

## 2020-08-25 NOTE — Telephone Encounter (Signed)
Returned call to patient and they stated that they were tolerating the praluent and that they would like to remain on it and I said we sure can oblige and the pt voiced understanding

## 2020-08-25 NOTE — Telephone Encounter (Signed)
Pt called in and need to speak to the pharmaists about the Alirocumab ([place]) 75 MG/ML SOAJ [[id number]]     She was suppose to call back a while ago and let them know how it was effecting her   Best number [phone] After 3 will be best

## 2020-08-27 ENCOUNTER — Other Ambulatory Visit (INDEPENDENT_AMBULATORY_CARE_PROVIDER_SITE_OTHER): Payer: Self-pay | Admitting: Ophthalmology

## 2020-09-01 DIAGNOSIS — I10 Essential (primary) hypertension: Secondary | ICD-10-CM | POA: Diagnosis not present

## 2020-09-01 DIAGNOSIS — E1165 Type 2 diabetes mellitus with hyperglycemia: Secondary | ICD-10-CM | POA: Diagnosis not present

## 2020-09-01 DIAGNOSIS — I2510 Atherosclerotic heart disease of native coronary artery without angina pectoris: Secondary | ICD-10-CM | POA: Diagnosis not present

## 2020-09-03 ENCOUNTER — Other Ambulatory Visit: Payer: Self-pay | Admitting: Family Medicine

## 2020-09-03 ENCOUNTER — Ambulatory Visit (INDEPENDENT_AMBULATORY_CARE_PROVIDER_SITE_OTHER): Payer: Medicare Other | Admitting: *Deleted

## 2020-09-03 DIAGNOSIS — Z5181 Encounter for therapeutic drug level monitoring: Secondary | ICD-10-CM | POA: Diagnosis not present

## 2020-09-03 DIAGNOSIS — I82A12 Acute embolism and thrombosis of left axillary vein: Secondary | ICD-10-CM

## 2020-09-03 DIAGNOSIS — K439 Ventral hernia without obstruction or gangrene: Secondary | ICD-10-CM

## 2020-09-03 LAB — POCT INR: INR: 2.8 (ref 2.0–3.0)

## 2020-09-03 NOTE — Patient Instructions (Signed)
Continue warfarin 1 tablet daily except 1/2 tablet on Tuesdays and Fridays Recheck in 3 weeks

## 2020-09-10 ENCOUNTER — Ambulatory Visit (HOSPITAL_COMMUNITY)
Admission: RE | Admit: 2020-09-10 | Discharge: 2020-09-10 | Disposition: A | Discharge status: Home / Self Care | Payer: Medicare Other | Source: Ambulatory Visit | Attending: Family Medicine | Admitting: Family Medicine

## 2020-09-10 DIAGNOSIS — E2839 Other primary ovarian failure: Secondary | ICD-10-CM

## 2020-09-10 DIAGNOSIS — Z1231 Encounter for screening mammogram for malignant neoplasm of breast: Secondary | ICD-10-CM | POA: Diagnosis not present

## 2020-09-10 DIAGNOSIS — Z780 Asymptomatic menopausal state: Secondary | ICD-10-CM | POA: Diagnosis not present

## 2020-09-17 ENCOUNTER — Other Ambulatory Visit (HOSPITAL_COMMUNITY): Payer: Self-pay | Admitting: Family Medicine

## 2020-09-17 DIAGNOSIS — R928 Other abnormal and inconclusive findings on diagnostic imaging of breast: Secondary | ICD-10-CM

## 2020-09-19 NOTE — Progress Notes (Signed)
[facility] Note  09/23/2020     CHIEF COMPLAINT Patient presents for Retina Follow Up   HISTORY OF PRESENT ILLNESS: [name] is a 80 y.o. female who presents to the clinic today for:  HPI    Retina Follow Up    Patient presents with  Wet AMD.  In right eye.  Since onset it is stable.  I, the attending physician,  performed the HPI with the patient and updated documentation appropriately.          Comments    Pt here for exu ARMD f/u, pt states her vision is better some days than others, she states right eye has been painful recently, she states she is having a hard time keeping her eyelashes out of the eye, she is using Cosopt BID OD only and latanoprost QHS OU.       Last edited by Bernarda Caffey, MD on 09/23/2020 11:18 AM. (History)    Pt states her left eye is staying blurry, she states her right eye is tender to touch, Cosopt has suddenly started hurting when she puts it in her right eye  Referring physician: Madelin Headings, DO [address] [place],  [place] [address]  HISTORICAL INFORMATION:   Selected notes from the MEDICAL RECORD NUMBER Referred by Dr. Madelin Headings for concern of SRF OD LEE: 11.27.20 (M. Cotter) [BCVA: OD: 20/80-- OS: 20/60-]  Ocular Hx-glaucoma (latanoprost)  PMH-DM    CURRENT MEDICATIONS: Current Outpatient Medications (Ophthalmic Drugs)  Medication Sig  . brimonidine (ALPHAGAN) 0.2 % ophthalmic solution Place 1 drop into the right eye 2 (two) times daily.  . dorzolamide-timolol (COSOPT) 22.3-6.8 MG/ML ophthalmic solution INSTILL 1 DROP INTO RIGHT EYE TWICE A DAY  . latanoprost (XALATAN) 0.005 % ophthalmic solution Place 1 drop into both eyes at bedtime.   [name] tetrahydrozoline 0.05 % ophthalmic solution Place 1 drop into both eyes at bedtime.    No current facility-administered medications for this visit. (Ophthalmic Drugs)   Current Outpatient Medications (Other)  Medication Sig  . albuterol (PROVENTIL  HFA;VENTOLIN HFA) 108 (90 BASE) MCG/ACT inhaler Inhale 2 puffs into the lungs every 4 (four) hours as needed for shortness of breath.  . Alirocumab (PRALUENT) 75 MG/ML SOAJ Inject 75 mg into the skin every 14 (fourteen) days.  [name] amitriptyline (ELAVIL) 25 MG tablet Take 50 mg by mouth at bedtime.   . Black Cohosh 40 MG CAPS Take 40 mg by mouth 2 (two) times daily.  . carvedilol (COREG) 6.25 MG tablet TAKE 1 TABLET BY MOUTH  TWICE DAILY  . cetirizine (ZYRTEC) 10 MG tablet Take 10 mg by mouth daily.  . Cholecalciferol (VITAMIN D-3) 1000 units CAPS Take 1 capsule by mouth daily.  . diazepam (VALIUM) 2 MG tablet Take by mouth.  . esomeprazole (NEXIUM) 20 MG capsule Take 20 mg by mouth daily at 12 noon.  . furosemide (LASIX) 40 MG tablet TAKE 1 TABLET (40 MG TOTAL) BY MOUTH DAILY. MAY TAKE EXTRA DAILY AS NEEDED FOR SWELLING  . gabapentin (NEURONTIN) 300 MG capsule Take 300 mg by mouth 3 (three) times daily.  [name] glimepiride (AMARYL) 2 MG tablet Take 2 mg by mouth daily.  . hydrALAZINE (APRESOLINE) 25 MG tablet Take 1 tablet (25 mg total) by mouth in the morning and at bedtime.  . isosorbide mononitrate (IMDUR) 30 MG 24 hr tablet TAKE 1 TABLET BY MOUTH  DAILY  . meclizine (ANTIVERT) 25 MG tablet Take 25 mg by mouth as  needed for dizziness.   . Omega-3 Fatty Acids (FISH OIL) 1200 MG CAPS Take 1 capsule by mouth 2 (two) times daily.  . polyethylene glycol (MIRALAX / GLYCOLAX) packet Take 17 g by mouth daily.  . potassium chloride SA (KLOR-CON) 20 MEQ tablet TAKE ONE-HALF TABLET BY  MOUTH DAILY  . vitamin B-12 (CYANOCOBALAMIN) 100 MCG tablet Take 1 tablet by mouth daily.  [name] warfarin (COUMADIN) 2 MG tablet Take 2 mg by mouth as directed.   No current facility-administered medications for this visit. (Other)      REVIEW OF SYSTEMS: ROS    Positive for: Endocrine, Cardiovascular, Eyes   Negative for: Constitutional, Gastrointestinal, Neurological, Skin, Genitourinary, Musculoskeletal, HENT,  Respiratory, Psychiatric, Allergic/Imm, Heme/Lymph   Last edited by Debbrah Alar, COT on 09/23/2020  9:28 AM. (History)       ALLERGIES Allergies  Allergen Reactions  . Vioxx [Rofecoxib] Shortness Of Breath  . Metformin And Related     Kidney failure  . Nexlizet [Bempedoic Acid-Ezetimibe]     Causes elevated Liver and Kidney function  . Penicillins     rash  . Pravastatin   . Repatha [Evolocumab]     MYALGIAS  . Codeine Rash  . Elemental Sulfur Rash  . Motrin [Ibuprofen] Rash    PAST MEDICAL HISTORY Past Medical History:  Diagnosis Date  . Antral gastritis    EGD 11/15  . Asthmatic bronchitis   . Back pain   . Chronic diastolic heart failure (HCC) 05/20/2015   Grade 2 diastolic dysfunction.  04/2015.  [name] Chronic kidney disease    kidney function low  . Diabetes mellitus    x 5 yrs  . DVT of axillary vein, acute left ([place]) 07/24/12  . GERD (gastroesophageal reflux disease)   . Glaucoma    POAG OU  . Hyperlipidemia 05/20/2015  . Hypertension   . Hypertensive retinopathy    OU  . Hypothyroidism   . Kidney stones   . Macular degeneration    Wet OD, Dry OS  . Mixed hyperlipidemia   . Peripheral venous insufficiency   . Pinched nerve    right elbow  . Sigmoid diverticulitis   . Vertigo    chonic   Past Surgical History:  Procedure Laterality Date  . ABDOMINAL HYSTERECTOMY    . BACK SURGERY     spinal   . CARDIAC CATHETERIZATION N/A 05/26/2015   Procedure: Left Heart Cath and Coronary Angiography;  Surgeon: Jettie Booze, MD;  Location: [facility];  Service: Cardiovascular;  Laterality: N/A;  . CATARACT EXTRACTION Bilateral   . CHOLECYSTECTOMY    . COLON SURGERY    . COLONOSCOPY N/A 12/27/2013   Procedure: COLONOSCOPY;  Surgeon: Rogene Houston, MD;  [facility]: [facility];  Service: Endoscopy;  Laterality: N/A;  200  . COLOSTOMY CLOSURE    . ESOPHAGOGASTRODUODENOSCOPY N/A 05/07/2014   Procedure: ESOPHAGOGASTRODUODENOSCOPY (EGD);  Surgeon:  Rogene Houston, MD;  Location: [facility];  Service: Endoscopy;  Laterality: N/A;  . EYE SURGERY Bilateral    Cat Sx  . fracture left foot    . HERNIA REPAIR    . NM MYOCAR PERF WALL MOTION  01/28/2009   Normal  . OTHER SURGICAL HISTORY     colostomy, colostomy reversal, for diverticulitis surgical hernia repair, arm surgery, neck surgery  . US ECHOCARDIOGRAPHY  02/11/2010   Mild MR,trace TR & AI    FAMILY HISTORY Family History  Problem Relation Age of Onset  .  Other Mother 35       Cause unknown  . Cancer Mother        liver  . CVA Maternal Grandmother 90       deceased  . Heart disease Maternal Grandmother   . Stroke Maternal Grandmother   . Heart attack Brother 59       deceased  . Cancer Sister 35       deceased  . Glaucoma Maternal Uncle     SOCIAL HISTORY Social History   Tobacco Use  . Smoking status: Former Smoker    Types: Cigarettes    Quit date: 12/10/2013    Years since quitting: 6.7  . Smokeless tobacco: Never Used  . Tobacco comment: Smoke 1-1 1/2 packs a day  Substance Use Topics  . Alcohol use: No  . Drug use: No         OPHTHALMIC EXAM:  Base Eye Exam    Visual Acuity (Snellen - Linear)      Right Left   Dist [place] 20/40 -2 20/25 -2   Dist ph [place] 20/30 -2 20/20 -2       Tonometry (Tonopen, 9:37 AM)      Right Left   Pressure 22 18       Pupils      Dark Light Shape React APD   Right 2 1 Round Brisk None   Left 2 1 Round Brisk None       Visual Fields (Counting fingers)      Left Right    Full Full       Extraocular Movement      Right Left    Full, Ortho Full, Ortho       Neuro/Psych    Oriented x3: Yes   Mood/Affect: Normal       Dilation    Both eyes: 1.0% Mydriacyl, 2.5% Phenylephrine @ 9:37 AM        Slit Lamp and Fundus Exam    Slit Lamp Exam      Right Left   Lids/Lashes Dermatochalasis - upper lid, mild Meibomian gland dysfunction Dermatochalasis - upper lid, Telangiectasia, mild Meibomian gland  dysfunction   Conjunctiva/Sclera White and quiet White and quiet   Cornea 2+Punctate epithelial erosions, mild arcus 1+ inferior Punctate epithelial erosions   Anterior Chamber Deep and quiet, narrow temporal angle Deep and quiet, narrow temporal angle   Iris Round and dilated Round and moderately dilated to 5.56mm   Lens Posterior chamber intraocular lens, 2+ Posterior capsular opacification approaching center Posterior chamber intraocular lens   Vitreous Vitreous syneresis, Posterior vitreous detachment Vitreous syneresis       Fundus Exam      Right Left   Disc Sharp rim, Pallor, +cupping, mild, temporal Peripapillary atrophy, superior rim thinning Pink and Sharp, temporal Peripapillary atrophy   C/D Ratio 0.75 0.5   Macula Flat, Blunted foveal reflex, +focal CNV/PED nasal macula with partial pigment ring, stable improvement in SRF/IRF/cystic changes, Drusen, no heme, RPE mottling and clumping Flat, Blunted foveal reflex, drusen, trace ERM, Retinal pigment epithelial mottling and clumping, No heme or edema   Vessels Vascular attenuation, Tortuous Vascular attenuation, Tortuous   Periphery Attached; no heme Attached; no heme          IMAGING AND PROCEDURES  Imaging and Procedures for @TODAY @  OCT, Retina - OU - Both Eyes       Right Eye Quality was good. Central Foveal Thickness: 197. Progression has been stable. Findings include intraretinal  hyper-reflective material, pigment epithelial detachment, subretinal hyper-reflective material, retinal drusen , normal foveal contour, outer retinal atrophy, no IRF, subretinal fluid (Stable improvement in IRF/cystic changes and SRF overlying PED nasal macula).   Left Eye Quality was good. Central Foveal Thickness: 211. Progression has been stable. Findings include normal foveal contour, no IRF, no SRF, retinal drusen .   Notes *Images captured and stored on drive  Diagnosis / Impression:  OD: exudative ARMD; Stable improvement in  IRF/cystic changes and SRF overlying PED nasal macula OS: NFP, no IRF/SRF; +drusen -- nonexudative ARMD  Clinical management:  See below  Abbreviations: NFP - Normal foveal profile. CME - cystoid macular edema. PED - pigment epithelial detachment. IRF - intraretinal fluid. SRF - subretinal fluid. EZ - ellipsoid zone. ERM - epiretinal membrane. ORA - outer retinal atrophy. ORT - outer retinal tubulation. SRHM - subretinal hyper-reflective material        Intravitreal Injection, Pharmacologic Agent - OD - Right Eye       Time Out 09/23/2020. 10:28 AM. Confirmed correct patient, procedure, site, and patient consented.   Anesthesia Topical anesthesia was used. Anesthetic medications included Lidocaine 2%, Proparacaine 0.5%.   Procedure Preparation included 5% betadine to ocular surface, eyelid speculum. A supplied needle was used.   Injection:  1.25 mg Bevacizumab (AVASTIN) 1.25mg /0.48mL SOLN   NDC: 75883-254-98, Lot: [id number], Expiration date: 11/11/2020   Route: Intravitreal, Site: Right Eye, Waste: 0.05 mL  Post-op Post injection exam found visual acuity of at least counting fingers. The patient tolerated the procedure well. There were no complications. The patient received written and verbal post procedure care education. Post injection medications were not given.   Notes                  ASSESSMENT/PLAN:    ICD-10-CM   1. Exudative age-related macular degeneration of right eye with active choroidal neovascularization (HCC)  H35.3211 Intravitreal Injection, Pharmacologic Agent - OD - Right Eye    Bevacizumab (AVASTIN) SOLN 1.25 mg  2. Retinal edema  H35.81 OCT, Retina - OU - Both Eyes  3. Intermediate stage nonexudative age-related macular degeneration of left eye  H35.3122   4. Diabetes mellitus type 2 without retinopathy ([place])  E11.9   5. Essential hypertension  I10   6. Hypertensive retinopathy of both eyes  H35.033   7. Pseudophakia of both eyes  Z96.1   8. PCO  (posterior capsular opacification), right  H26.491   9. Primary open angle glaucoma of both eyes, unspecified glaucoma stage  H40.1130    1,2. 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, OD    - delayed follow up from 4 weeks to 8 weeks due to passing of husband (12.11.20-02.12.21)  - s/p IVA OD #1 (12.11.20), #2 (02.12.21), #3 (03.12.21), #4 (04.09.21), #5 (05.14.21), #6 (06.17.21), #7 (07.23.21), #8 (10.15.21), #9 (12.7.21)  - FA 12.11.20 confirms +CNVM  - OCT today shows stable improvement in IRF/SRF overlying nasal PED  - BCVA OD 20/30 (stable)  - recommend IVA OD #10 today, 3.22.22 -- maintenance w/ ext to 10 wks  - pt wishes to proceed  - RBA of procedure discussed, questions answered  - informed consent obtained and signed  - see procedure note  - Avastin informed consent form signed and scanned on 12.11.2020 (OD)  - of note, pt presented acutely on 03.16.21 due to decreased vision after injection OD -- ?post injection epi defect -- no epi defect today             -  Continue to use AT OD  - f/u in 10 wks -- DFE/OCT/possible injection; treat and extend as able  3. Age related macular degeneration, non-exudative, OS  - intermediate stage  - The incidence, anatomy, and pathology of dry AMD, risk of progression, and the AREDS and AREDS 2 study including smoking risks discussed with patient.  - Recommend amsler grid monitoring  4. Diabetes mellitus, type 2 without retinopathy  - The incidence, risk factors for progression, natural history and treatment options for diabetic retinopathy  were discussed with patient.    - The need for close monitoring of blood glucose, blood pressure, and serum lipids, avoiding cigarette or any type of tobacco, and the need for long term follow up was also discussed with patient.  - monitor  5,6. Hypertensive retinopathy OU  - discussed importance of tight BP control  - monitor  7,8. Pseudophakia OU, PCO OD  - s/p CE/IOL OU (Dr. Venetia Maxon)  - IOL in  good position  - PCO OD -- pt reports significant glare symptoms  - recommend YAG cap OD  9. POAG OU  - formerly managed by Dr. Venetia Maxon  - s/p laser w/ Dr. Venetia Maxon -- ?SLT  - IOP 22,18  - currently on latanoprost QHS OU  - cont Cosopt BID OD only  - add brimonidine BID OD only  - monitor  Ophthalmic Meds Ordered this visit:  Meds ordered this encounter  Medications  . brimonidine (ALPHAGAN) 0.2 % ophthalmic solution    Sig: Place 1 drop into the right eye 2 (two) times daily.    Dispense:  15 mL    Refill:  6  . Bevacizumab (AVASTIN) SOLN 1.25 mg      Return in about 2 weeks (around 10/07/2020) for f/u 2 weeks for YAG OD, DFE, OCT, f/u 10 weeks, exu ARMD OD, DFE, OCT.  There are no Patient Instructions on file for this visit.   Explained the diagnoses, plan, and follow up with the patient and they expressed understanding.  Patient expressed understanding of the importance of proper follow up care.   This document serves as a record of services personally performed by Gardiner Sleeper, MD, PhD. It was created on their behalf by Estill Bakes, COT an ophthalmic technician. The creation of this record is the provider's dictation and/or activities during the visit.    Electronically signed by: Estill Bakes, COT 3.18.22 @ 11:29 AM   This document serves as a record of services personally performed by Gardiner Sleeper, MD, PhD. It was created on their behalf by San Jetty. Owens Shark, OA an ophthalmic technician. The creation of this record is the provider's dictation and/or activities during the visit.    Electronically signed by: San Jetty. Marguerita Merles 03.22.2022 11:29 AM  Gardiner Sleeper, M.D., Ph.D. Diseases & Surgery of the Retina and [place] 09/23/2020   I have reviewed the above documentation for accuracy and completeness, and I agree with the above. Gardiner Sleeper, M.D., Ph.D. 09/23/20 11:29 AM  Abbreviations: M myopia (nearsighted); A  astigmatism; H hyperopia (farsighted); P presbyopia; Mrx spectacle prescription;  CTL contact lenses; OD right eye; OS left eye; OU both eyes  XT exotropia; ET esotropia; PEK punctate epithelial keratitis; PEE punctate epithelial erosions; DES dry eye syndrome; MGD meibomian gland dysfunction; ATs artificial tears; PFAT's preservative free artificial tears; [place] nuclear sclerotic cataract; PSC posterior subcapsular cataract; ERM epi-retinal membrane; PVD posterior vitreous detachment; RD retinal detachment; DM diabetes mellitus; DR  diabetic retinopathy; NPDR non-proliferative diabetic retinopathy; PDR proliferative diabetic retinopathy; CSME clinically significant macular edema; DME diabetic macular edema; dbh dot blot hemorrhages; CWS cotton wool spot; POAG primary open angle glaucoma; C/D cup-to-disc ratio; HVF humphrey visual field; GVF goldmann visual field; OCT optical coherence tomography; IOP intraocular pressure; BRVO Branch retinal vein occlusion; CRVO central retinal vein occlusion; CRAO central retinal artery occlusion; BRAO branch retinal artery occlusion; RT retinal tear; SB scleral buckle; PPV pars plana vitrectomy; VH Vitreous hemorrhage; PRP panretinal laser photocoagulation; IVK intravitreal kenalog; VMT vitreomacular traction; MH Macular hole;  NVD neovascularization of the disc; NVE neovascularization elsewhere; AREDS age related eye disease study; ARMD age related macular degeneration; POAG primary open angle glaucoma; EBMD epithelial/anterior basement membrane dystrophy; ACIOL anterior chamber intraocular lens; IOL intraocular lens; PCIOL posterior chamber intraocular lens; Phaco/IOL phacoemulsification with intraocular lens placement; [place] photorefractive keratectomy; LASIK laser assisted in situ keratomileusis; HTN hypertension; DM diabetes mellitus; COPD chronic obstructive pulmonary disease

## 2020-09-23 ENCOUNTER — Ambulatory Visit: Payer: Medicare Other | Admitting: General Surgery

## 2020-09-23 ENCOUNTER — Ambulatory Visit (HOSPITAL_COMMUNITY)
Admission: RE | Admit: 2020-09-23 | Discharge: 2020-09-23 | Disposition: A | Discharge status: Home / Self Care | Payer: Medicare Other | Source: Ambulatory Visit | Attending: Family Medicine | Admitting: Family Medicine

## 2020-09-23 ENCOUNTER — Encounter (INDEPENDENT_AMBULATORY_CARE_PROVIDER_SITE_OTHER): Payer: Self-pay | Admitting: Ophthalmology

## 2020-09-23 ENCOUNTER — Other Ambulatory Visit: Payer: Self-pay

## 2020-09-23 ENCOUNTER — Ambulatory Visit (INDEPENDENT_AMBULATORY_CARE_PROVIDER_SITE_OTHER): Payer: Medicare Other | Admitting: Ophthalmology

## 2020-09-23 DIAGNOSIS — H353211 Exudative age-related macular degeneration, right eye, with active choroidal neovascularization: Secondary | ICD-10-CM

## 2020-09-23 DIAGNOSIS — H353122 Nonexudative age-related macular degeneration, left eye, intermediate dry stage: Secondary | ICD-10-CM

## 2020-09-23 DIAGNOSIS — E119 Type 2 diabetes mellitus without complications: Secondary | ICD-10-CM | POA: Diagnosis not present

## 2020-09-23 DIAGNOSIS — I10 Essential (primary) hypertension: Secondary | ICD-10-CM | POA: Diagnosis not present

## 2020-09-23 DIAGNOSIS — R928 Other abnormal and inconclusive findings on diagnostic imaging of breast: Secondary | ICD-10-CM

## 2020-09-23 DIAGNOSIS — Z961 Presence of intraocular lens: Secondary | ICD-10-CM | POA: Diagnosis not present

## 2020-09-23 DIAGNOSIS — H26491 Other secondary cataract, right eye: Secondary | ICD-10-CM

## 2020-09-23 DIAGNOSIS — H401130 Primary open-angle glaucoma, bilateral, stage unspecified: Secondary | ICD-10-CM | POA: Diagnosis not present

## 2020-09-23 DIAGNOSIS — H3581 Retinal edema: Secondary | ICD-10-CM

## 2020-09-23 DIAGNOSIS — H35033 Hypertensive retinopathy, bilateral: Secondary | ICD-10-CM | POA: Diagnosis not present

## 2020-09-23 DIAGNOSIS — R922 Inconclusive mammogram: Secondary | ICD-10-CM | POA: Diagnosis not present

## 2020-09-23 DIAGNOSIS — R921 Mammographic calcification found on diagnostic imaging of breast: Secondary | ICD-10-CM | POA: Diagnosis not present

## 2020-09-23 MED ORDER — BRIMONIDINE TARTRATE 0.2 % OP SOLN
1.0000 [drp] | Freq: Two times a day (BID) | OPHTHALMIC | 6 refills | Status: DC
Start: 2020-09-23 — End: 2021-01-15

## 2020-09-23 MED ORDER — BEVACIZUMAB CHEMO INJECTION 1.25MG/0.05ML SYRINGE FOR KALEIDOSCOPE
1.2500 mg | INTRAVITREAL | Status: AC | PRN
  Administered 2020-09-23: 1.25 mg via INTRAVITREAL

## 2020-09-26 ENCOUNTER — Other Ambulatory Visit: Payer: Self-pay | Admitting: Family Medicine

## 2020-09-26 DIAGNOSIS — R921 Mammographic calcification found on diagnostic imaging of breast: Secondary | ICD-10-CM

## 2020-09-29 ENCOUNTER — Ambulatory Visit (INDEPENDENT_AMBULATORY_CARE_PROVIDER_SITE_OTHER): Payer: Medicare Other | Admitting: *Deleted

## 2020-09-29 ENCOUNTER — Other Ambulatory Visit: Payer: Self-pay

## 2020-09-29 DIAGNOSIS — I82A12 Acute embolism and thrombosis of left axillary vein: Secondary | ICD-10-CM

## 2020-09-29 DIAGNOSIS — Z5181 Encounter for therapeutic drug level monitoring: Secondary | ICD-10-CM | POA: Diagnosis not present

## 2020-09-29 LAB — POCT INR: INR: 2.1 (ref 2.0–3.0)

## 2020-09-29 NOTE — Patient Instructions (Signed)
Continue warfarin 1 tablet daily except 1/2 tablet on Tuesdays and Fridays Recheck in 4 weeks

## 2020-10-01 DIAGNOSIS — Z1389 Encounter for screening for other disorder: Secondary | ICD-10-CM | POA: Diagnosis not present

## 2020-10-01 DIAGNOSIS — I10 Essential (primary) hypertension: Secondary | ICD-10-CM | POA: Diagnosis not present

## 2020-10-01 DIAGNOSIS — I2510 Atherosclerotic heart disease of native coronary artery without angina pectoris: Secondary | ICD-10-CM | POA: Diagnosis not present

## 2020-10-01 DIAGNOSIS — E1165 Type 2 diabetes mellitus with hyperglycemia: Secondary | ICD-10-CM | POA: Diagnosis not present

## 2020-10-01 DIAGNOSIS — E559 Vitamin D deficiency, unspecified: Secondary | ICD-10-CM | POA: Diagnosis not present

## 2020-10-01 DIAGNOSIS — E063 Autoimmune thyroiditis: Secondary | ICD-10-CM | POA: Diagnosis not present

## 2020-10-01 DIAGNOSIS — I700 Atherosclerosis of aorta: Secondary | ICD-10-CM | POA: Diagnosis not present

## 2020-10-01 DIAGNOSIS — E118 Type 2 diabetes mellitus with unspecified complications: Secondary | ICD-10-CM | POA: Diagnosis not present

## 2020-10-01 DIAGNOSIS — E7849 Other hyperlipidemia: Secondary | ICD-10-CM | POA: Diagnosis not present

## 2020-10-07 ENCOUNTER — Encounter (INDEPENDENT_AMBULATORY_CARE_PROVIDER_SITE_OTHER): Payer: Medicare Other | Admitting: Ophthalmology

## 2020-10-07 NOTE — Progress Notes (Addendum)
[facility] Note  10/08/2020     CHIEF COMPLAINT Patient presents for Retina Follow Up   HISTORY OF PRESENT ILLNESS: [name] is a 80 y.o. female who presents to the clinic today for:  HPI    Retina Follow Up    Patient presents with  Wet AMD.  In right eye.  This started years ago.  Severity is moderate.  Duration of 2 weeks.  Since onset it is stable.  I, the attending physician,  performed the HPI with the patient and updated documentation appropriately.          Comments    80 y/o female pt here for 2 wk f/u for exu ARMD OD and yag OD.  No change in [place] OU.  Denies pain, FOL, floaters.  BS unknown.  A1C 8.5.  Latanoprost QHS OU, Cosopt BID OD.  Self d/c Brimonidine OD due to it causing her eye to itch severely.       Last edited by Bernarda Caffey, MD on 10/08/2020  1:43 PM. (History)    Pt states   Referring physician: Sharilyn Sites, MD [address] [place],  [place] [address]  HISTORICAL INFORMATION:   Selected notes from the MEDICAL RECORD NUMBER Referred by Dr. Madelin Headings for concern of SRF OD LEE: 11.27.20 (M. Cotter) [BCVA: OD: 20/80-- OS: 20/60-]  Ocular Hx-glaucoma (latanoprost)  PMH-DM    CURRENT MEDICATIONS: Current Outpatient Medications (Ophthalmic Drugs)  Medication Sig  . dorzolamide-timolol (COSOPT) 22.3-6.8 MG/ML ophthalmic solution INSTILL 1 DROP INTO RIGHT EYE TWICE A DAY  . latanoprost (XALATAN) 0.005 % ophthalmic solution Place 1 drop into both eyes at bedtime.   . brimonidine (ALPHAGAN) 0.2 % ophthalmic solution Place 1 drop into the right eye 2 (two) times daily. (Patient not taking: Reported on 10/08/2020)  . tetrahydrozoline 0.05 % ophthalmic solution Place 1 drop into both eyes at bedtime.  (Patient not taking: Reported on 10/08/2020)   No current facility-administered medications for this visit. (Ophthalmic Drugs)   Current Outpatient Medications (Other)  Medication Sig  . albuterol (PROVENTIL HFA;VENTOLIN  HFA) 108 (90 BASE) MCG/ACT inhaler Inhale 2 puffs into the lungs every 4 (four) hours as needed for shortness of breath.  . Alirocumab (PRALUENT) 75 MG/ML SOAJ Inject 75 mg into the skin every 14 (fourteen) days.  [name] amitriptyline (ELAVIL) 25 MG tablet Take 50 mg by mouth at bedtime.   . Black Cohosh 40 MG CAPS Take 40 mg by mouth 2 (two) times daily.  . carvedilol (COREG) 6.25 MG tablet TAKE 1 TABLET BY MOUTH  TWICE DAILY  . cetirizine (ZYRTEC) 10 MG tablet Take 10 mg by mouth daily.  . Cholecalciferol (VITAMIN D-3) 1000 units CAPS Take 1 capsule by mouth daily.  . diazepam (VALIUM) 2 MG tablet Take by mouth.  . esomeprazole (NEXIUM) 20 MG capsule Take 20 mg by mouth daily at 12 noon.  [name] FARXIGA 5 MG TABS tablet Take 5 mg by mouth every morning.  . furosemide (LASIX) 40 MG tablet TAKE 1 TABLET (40 MG TOTAL) BY MOUTH DAILY. MAY TAKE EXTRA DAILY AS NEEDED FOR SWELLING  . gabapentin (NEURONTIN) 300 MG capsule Take 300 mg by mouth 3 (three) times daily.  [name] glimepiride (AMARYL) 2 MG tablet Take 2 mg by mouth daily.  . hydrALAZINE (APRESOLINE) 25 MG tablet Take 1 tablet (25 mg total) by mouth in the morning and at bedtime.  . isosorbide mononitrate (IMDUR) 30 MG 24 hr tablet TAKE 1  TABLET BY MOUTH  DAILY  . meclizine (ANTIVERT) 25 MG tablet Take 25 mg by mouth as needed for dizziness.   . Omega-3 Fatty Acids (FISH OIL) 1200 MG CAPS Take 1 capsule by mouth 2 (two) times daily.  . polyethylene glycol (MIRALAX / GLYCOLAX) packet Take 17 g by mouth daily.  . potassium chloride SA (KLOR-CON) 20 MEQ tablet TAKE ONE-HALF TABLET BY  MOUTH DAILY  . TRADJENTA 5 MG TABS tablet Take 5 mg by mouth daily.  . vitamin B-12 (CYANOCOBALAMIN) 100 MCG tablet Take 1 tablet by mouth daily.  [name] warfarin (COUMADIN) 2 MG tablet Take 2 mg by mouth as directed.   No current facility-administered medications for this visit. (Other)      REVIEW OF SYSTEMS: ROS    Positive for: Endocrine, Eyes   Negative for:  Constitutional, Gastrointestinal, Neurological, Skin, Genitourinary, Musculoskeletal, HENT, Cardiovascular, Respiratory, Psychiatric, Allergic/Imm, Heme/Lymph   Last edited by Matthew Folks, COA on 10/08/2020  1:16 PM. (History)       ALLERGIES Allergies  Allergen Reactions  . Vioxx [Rofecoxib] Shortness Of Breath  . Metformin And Related     Kidney failure  . Nexlizet [Bempedoic Acid-Ezetimibe]     Causes elevated Liver and Kidney function  . Penicillins     rash  . Pravastatin   . Repatha [Evolocumab]     MYALGIAS  . Codeine Rash  . Elemental Sulfur Rash  . Motrin [Ibuprofen] Rash    PAST MEDICAL HISTORY Past Medical History:  Diagnosis Date  . Antral gastritis    EGD 11/15  . Asthmatic bronchitis   . Back pain   . Chronic diastolic heart failure (HCC) 05/20/2015   Grade 2 diastolic dysfunction.  04/2015.  [name] Chronic kidney disease    kidney function low  . Diabetes mellitus    x 5 yrs  . DVT of axillary vein, acute left ([place]) 07/24/12  . GERD (gastroesophageal reflux disease)   . Glaucoma    POAG OU  . Hyperlipidemia 05/20/2015  . Hypertension   . Hypertensive retinopathy    OU  . Hypothyroidism   . Kidney stones   . Macular degeneration    Wet OD, Dry OS  . Mixed hyperlipidemia   . Peripheral venous insufficiency   . Pinched nerve    right elbow  . Sigmoid diverticulitis   . Vertigo    chonic   Past Surgical History:  Procedure Laterality Date  . ABDOMINAL HYSTERECTOMY    . BACK SURGERY     spinal   . CARDIAC CATHETERIZATION N/A 05/26/2015   Procedure: Left Heart Cath and Coronary Angiography;  Surgeon: Jettie Booze, MD;  Location: [facility];  Service: Cardiovascular;  Laterality: N/A;  . CATARACT EXTRACTION Bilateral   . CHOLECYSTECTOMY    . COLON SURGERY    . COLONOSCOPY N/A 12/27/2013   Procedure: COLONOSCOPY;  Surgeon: Rogene Houston, MD;  Location: [facility];  Service: Endoscopy;  Laterality: N/A;  200  . COLOSTOMY  CLOSURE    . ESOPHAGOGASTRODUODENOSCOPY N/A 05/07/2014   Procedure: ESOPHAGOGASTRODUODENOSCOPY (EGD);  Surgeon: Rogene Houston, MD;  Location: [facility];  Service: Endoscopy;  Laterality: N/A;  . EYE SURGERY Bilateral    Cat Sx  . fracture left foot    . HERNIA REPAIR    . NM MYOCAR PERF WALL MOTION  01/28/2009   Normal  . OTHER SURGICAL HISTORY     colostomy, colostomy reversal, for diverticulitis surgical hernia repair, arm  surgery, neck surgery  . US ECHOCARDIOGRAPHY  02/11/2010   Mild MR,trace TR & AI    FAMILY HISTORY Family History  Problem Relation Age of Onset  . Other Mother 16       Cause unknown  . Cancer Mother        liver  . CVA Maternal Grandmother 90       deceased  . Heart disease Maternal Grandmother   . Stroke Maternal Grandmother   . Heart attack Brother 69       deceased  . Cancer Sister 60       deceased  . Glaucoma Maternal Uncle     SOCIAL HISTORY Social History   Tobacco Use  . Smoking status: Former Smoker    Types: Cigarettes    Quit date: 12/10/2013    Years since quitting: 6.8  . Smokeless tobacco: Never Used  . Tobacco comment: Smoke 1-1 1/2 packs a day  Substance Use Topics  . Alcohol use: No  . Drug use: No         OPHTHALMIC EXAM:  Base Eye Exam    Visual Acuity (Snellen - Linear)      Right Left   Dist [place] 20/50 -2 20/20 -2   Dist ph [place] 20/40 -2        Tonometry (Tonopen, 1:21 PM)      Right Left   Pressure 18 17       Pupils      Dark Light Shape React APD   Right 2 1 Round Brisk None   Left 2 1 Round Brisk None       Visual Fields (Counting fingers)      Left Right    Full Full       Extraocular Movement      Right Left    Full, Ortho Full, Ortho       Neuro/Psych    Oriented x3: Yes   Mood/Affect: Normal       Dilation    Both eyes: 1.0% Mydriacyl, 2.5% Phenylephrine @ 1:21 PM        Slit Lamp and Fundus Exam    Slit Lamp Exam      Right Left   Lids/Lashes Dermatochalasis - upper lid,  mild Meibomian gland dysfunction Dermatochalasis - upper lid, Telangiectasia, mild Meibomian gland dysfunction   Conjunctiva/Sclera White and quiet White and quiet   Cornea 2+Punctate epithelial erosions, mild arcus 1+ inferior Punctate epithelial erosions   Anterior Chamber Deep and quiet, narrow temporal angle Deep and quiet, narrow temporal angle   Iris Round and dilated Round and moderately dilated to 5.32mm   Lens Posterior chamber intraocular lens, 2+ Posterior capsular opacification approaching center Posterior chamber intraocular lens   Vitreous Vitreous syneresis, Posterior vitreous detachment Vitreous syneresis       Fundus Exam      Right Left   Disc Sharp rim, Pallor, +cupping, mild, temporal Peripapillary atrophy, superior rim thinning Pink and Sharp, temporal Peripapillary atrophy   C/D Ratio 0.75 0.5   Macula Flat, Blunted foveal reflex, +focal CNV/PED nasal macula with partial pigment ring, stable improvement in SRF/IRF/cystic changes, Drusen, no heme, RPE mottling and clumping Flat, Blunted foveal reflex, drusen, trace ERM, Retinal pigment epithelial mottling and clumping, No heme or edema   Vessels Vascular attenuation, Tortuous Vascular attenuation, Tortuous   Periphery Attached; no heme Attached; no heme          IMAGING AND PROCEDURES  Imaging and Procedures for @TODAY @  OCT, Retina - OU - Both Eyes       Right Eye Quality was good. Central Foveal Thickness: 193. Progression has been stable. Findings include intraretinal hyper-reflective material, pigment epithelial detachment, subretinal hyper-reflective material, retinal drusen , normal foveal contour, outer retinal atrophy, no IRF, subretinal fluid (Stable improvement in IRF/cystic changes and SRF overlying PED nasal macula).   Left Eye Quality was good. Central Foveal Thickness: 210. Progression has been stable. Findings include normal foveal contour, no IRF, no SRF, retinal drusen .   Notes *Images  captured and stored on drive  Diagnosis / Impression:  OD: exudative ARMD; Stable improvement in IRF/cystic changes and SRF overlying PED nasal macula OS: NFP, no IRF/SRF; +drusen -- nonexudative ARMD  Clinical management:  See below  Abbreviations: NFP - Normal foveal profile. CME - cystoid macular edema. PED - pigment epithelial detachment. IRF - intraretinal fluid. SRF - subretinal fluid. EZ - ellipsoid zone. ERM - epiretinal membrane. ORA - outer retinal atrophy. ORT - outer retinal tubulation. SRHM - subretinal hyper-reflective material        Yag Capsulotomy - OD - Right Eye       Time Out Confirmed correct patient, procedure, site, and patient consented.   Anesthesia Topical anesthesia was used.   Notes Procedure note: YAG Capsulotomy, RIGHT Eye  Informed consent obtained. Pre-op dilating drops (1% Topicamide and 2.5% Phenylephrine), and topical anesthesia given. Power: 6.7 mJ Shots: 13 Posterior capsulotomy in cruciate formation performed without difficulty. Patient tolerated procedure well. No complications. Lotemax SM 4 times a day for 7 days, then stop. Pt received written and verbal post laser education. Recheck in 2 weeks w/ dilated exam.                 ASSESSMENT/PLAN:    ICD-10-CM   1. Exudative age-related macular degeneration of right eye with active choroidal neovascularization ([place])  H35.3211   2. Retinal edema  H35.81 OCT, Retina - OU - Both Eyes  3. Intermediate stage nonexudative age-related macular degeneration of left eye  H35.3122   4. Diabetes mellitus type 2 without retinopathy ([place])  E11.9   5. Essential hypertension  I10   6. Hypertensive retinopathy of both eyes  H35.033   7. Pseudophakia of both eyes  Z96.1   8. PCO (posterior capsular opacification), right  H26.491 Yag Capsulotomy - OD - Right Eye  9. Primary open angle glaucoma of both eyes, unspecified glaucoma stage  H40.1130    1,2. Exudative age related macular  degeneration, OD    - delayed follow up from 4 weeks to 8 weeks due to passing of husband (12.11.20-02.12.21)  - s/p IVA OD #1 (12.11.20), #2 (02.12.21), #3 (03.12.21), #4 (04.09.21), #5 (05.14.21), #6 (06.17.21), #7 (07.23.21), #8 (10.15.21), #9 (12.7.21), #10 (03.22.22)  - FA 12.11.20 confirms +CNVM  - OCT today shows stable improvement in IRF/SRF overlying nasal PED  - BCVA OD 20/40 (stable)  - Avastin informed consent form signed and scanned on 12.11.2020 (OD)  - of note, pt presented acutely on 03.16.21 due to decreased vision after injection OD -- ?post injection epi defect -- no epi defect today             - Continue to use AT OD  - f/u in 10 wks from 3.22.22 -- DFE/OCT/possible injection; treat and extend as able  3. Age related macular degeneration, non-exudative, OS  - intermediate stage  - The incidence, anatomy, and pathology of dry AMD, risk of progression, and the AREDS and  AREDS 2 study including smoking risks discussed with patient.  - recommend Amsler grid monitoring  4. Diabetes mellitus, type 2 without retinopathy  - The incidence, risk factors for progression, natural history and treatment options for diabetic retinopathy  were discussed with patient.    - The need for close monitoring of blood glucose, blood pressure, and serum lipids, avoiding cigarette or any type of tobacco, and the need for long term follow up was also discussed with patient.  - monitor  5,6. Hypertensive retinopathy OU  - discussed importance of tight BP control  - monitor  7,8. Pseudophakia OU, PCO OD  - s/p CE/IOL OU (Dr. Venetia Maxon)  - IOL in good position  - PCO OD -- pt reports significant glare symptoms  - recommend yag cap OD today, 04.06.22  - pt wishes to proceed  - RBA of procedure discussed, questions answered  - informed consent obtained and signed  - see procedure note  - start Lotemax SM QID OD x7 days -- samples given  - f/u 2 weeks, POV  9. POAG OU  - formerly managed by  Dr. Venetia Maxon  - s/p laser w/ Dr. Venetia Maxon -- ?SLT  - IOP 18,17  - currently on latanoprost QHS OU  - cont Cosopt BID OD only  - add brimonidine BID OD only -- pt self d/c due to itching  - monitor  Ophthalmic Meds Ordered this visit:  No orders of the defined types were placed in this encounter.     Return in about 2 weeks (around 10/22/2020) for f/u s/p yag OD, DFE, OCT.  There are no Patient Instructions on file for this visit.   Explained the diagnoses, plan, and follow up with the patient and they expressed understanding.  Patient expressed understanding of the importance of proper follow up care.   This document serves as a record of services personally performed by Gardiner Sleeper, MD, PhD. It was created on their behalf by Roselee Nova, COMT. The creation of this record is the provider's dictation and/or activities during the visit.  Electronically signed by: Roselee Nova, COMT 10/08/20 1:56 PM   This document serves as a record of services personally performed by Gardiner Sleeper, MD, PhD. It was created on their behalf by San Jetty. Owens Shark, OA an ophthalmic technician. The creation of this record is the provider's dictation and/or activities during the visit.    Electronically signed by: San Jetty. Owens Shark, [place] 04.06.2022 1:56 PM  Gardiner Sleeper, M.D., Ph.D. Diseases & Surgery of the Retina and Vitreous [facility]  I have reviewed the above documentation for accuracy and completeness, and I agree with the above. Gardiner Sleeper, M.D., Ph.D. 10/08/20 1:56 PM  Abbreviations: M myopia (nearsighted); A astigmatism; H hyperopia (farsighted); P presbyopia; Mrx spectacle prescription;  CTL contact lenses; OD right eye; OS left eye; OU both eyes  XT exotropia; ET esotropia; PEK punctate epithelial keratitis; PEE punctate epithelial erosions; DES dry eye syndrome; MGD meibomian gland dysfunction; ATs artificial tears; PFAT's preservative free artificial tears; [place]  nuclear sclerotic cataract; PSC posterior subcapsular cataract; ERM epi-retinal membrane; PVD posterior vitreous detachment; RD retinal detachment; DM diabetes mellitus; DR diabetic retinopathy; NPDR non-proliferative diabetic retinopathy; PDR proliferative diabetic retinopathy; CSME clinically significant macular edema; DME diabetic macular edema; dbh dot blot hemorrhages; CWS cotton wool spot; POAG primary open angle glaucoma; C/D cup-to-disc ratio; HVF humphrey visual field; GVF goldmann visual field; OCT optical coherence tomography; IOP intraocular pressure; BRVO Branch  retinal vein occlusion; CRVO central retinal vein occlusion; CRAO central retinal artery occlusion; BRAO branch retinal artery occlusion; RT retinal tear; SB scleral buckle; PPV pars plana vitrectomy; VH Vitreous hemorrhage; PRP panretinal laser photocoagulation; IVK intravitreal kenalog; VMT vitreomacular traction; MH Macular hole;  NVD neovascularization of the disc; NVE neovascularization elsewhere; AREDS age related eye disease study; ARMD age related macular degeneration; POAG primary open angle glaucoma; EBMD epithelial/anterior basement membrane dystrophy; ACIOL anterior chamber intraocular lens; IOL intraocular lens; PCIOL posterior chamber intraocular lens; Phaco/IOL phacoemulsification with intraocular lens placement; [place] photorefractive keratectomy; LASIK laser assisted in situ keratomileusis; HTN hypertension; DM diabetes mellitus; COPD chronic obstructive pulmonary disease

## 2020-10-08 ENCOUNTER — Encounter (INDEPENDENT_AMBULATORY_CARE_PROVIDER_SITE_OTHER): Payer: Self-pay | Admitting: Ophthalmology

## 2020-10-08 ENCOUNTER — Ambulatory Visit (INDEPENDENT_AMBULATORY_CARE_PROVIDER_SITE_OTHER): Payer: Medicare Other | Admitting: Ophthalmology

## 2020-10-08 ENCOUNTER — Other Ambulatory Visit: Payer: Self-pay

## 2020-10-08 DIAGNOSIS — Z961 Presence of intraocular lens: Secondary | ICD-10-CM

## 2020-10-08 DIAGNOSIS — E119 Type 2 diabetes mellitus without complications: Secondary | ICD-10-CM

## 2020-10-08 DIAGNOSIS — H26491 Other secondary cataract, right eye: Secondary | ICD-10-CM | POA: Diagnosis not present

## 2020-10-08 DIAGNOSIS — H401130 Primary open-angle glaucoma, bilateral, stage unspecified: Secondary | ICD-10-CM

## 2020-10-08 DIAGNOSIS — H353211 Exudative age-related macular degeneration, right eye, with active choroidal neovascularization: Secondary | ICD-10-CM

## 2020-10-08 DIAGNOSIS — I10 Essential (primary) hypertension: Secondary | ICD-10-CM

## 2020-10-08 DIAGNOSIS — H35033 Hypertensive retinopathy, bilateral: Secondary | ICD-10-CM | POA: Diagnosis not present

## 2020-10-08 DIAGNOSIS — H353122 Nonexudative age-related macular degeneration, left eye, intermediate dry stage: Secondary | ICD-10-CM | POA: Diagnosis not present

## 2020-10-08 DIAGNOSIS — H3581 Retinal edema: Secondary | ICD-10-CM | POA: Diagnosis not present

## 2020-10-09 ENCOUNTER — Ambulatory Visit
Admission: RE | Admit: 2020-10-09 | Discharge: 2020-10-09 | Disposition: A | Discharge status: Home / Self Care | Payer: Medicare Other | Source: Ambulatory Visit | Attending: Family Medicine | Admitting: Family Medicine

## 2020-10-09 DIAGNOSIS — N6089 Other benign mammary dysplasias of unspecified breast: Secondary | ICD-10-CM | POA: Diagnosis not present

## 2020-10-09 DIAGNOSIS — R921 Mammographic calcification found on diagnostic imaging of breast: Secondary | ICD-10-CM

## 2020-10-13 ENCOUNTER — Telehealth: Payer: Self-pay | Admitting: Cardiovascular Disease

## 2020-10-13 MED ORDER — POTASSIUM CHLORIDE CRYS ER 20 MEQ PO TBCR: 10.0000 meq | EXTENDED_RELEASE_TABLET | Freq: Every day | ORAL | 9 refills | Status: DC

## 2020-10-13 MED ORDER — CARVEDILOL 6.25 MG PO TABS: 6.2500 mg | ORAL_TABLET | Freq: Two times a day (BID) | ORAL | 3 refills | Status: DC

## 2020-10-13 MED ORDER — HYDRALAZINE HCL 25 MG PO TABS: 25.0000 mg | ORAL_TABLET | Freq: Two times a day (BID) | ORAL | 3 refills | Status: DC

## 2020-10-13 MED ORDER — ISOSORBIDE MONONITRATE ER 30 MG PO TB24: 30.0000 mg | ORAL_TABLET | Freq: Every day | ORAL | 3 refills | Status: DC

## 2020-10-13 NOTE — Telephone Encounter (Signed)
*  STAT* If patient is at the pharmacy, call can be transferred to refill team.   1. Which medications need to be refilled? (please list name of each medication and dose if known)  hydrALAZINE (APRESOLINE) 25 MG tablet carvedilol (COREG) 6.25 MG tablet isosorbide mononitrate (IMDUR) 30 MG 24 hr tablet potassium chloride SA (KLOR-CON) 20 MEQ tablet  2. Which pharmacy/location (including street and city if local pharmacy) is medication to be sent to? [facility] - [place], [place] - [place] [address]  3. Do they need a 30 day or 90 day supply? 90    Refills were requested by Caren Griffins, CMA with [place] [place] [facility] [place] who is her patient advocate. She can be reached at [phone] x [phone]

## 2020-10-16 NOTE — Progress Notes (Signed)
[facility] Note  10/22/2020     CHIEF COMPLAINT Patient presents for Retina Follow Up   HISTORY OF PRESENT ILLNESS: [name] is a 80 y.o. female who presents to the clinic today for:  HPI    Retina Follow Up    Patient presents with  Other.  In right eye.  This started 2 weeks ago.  I, the attending physician,  performed the HPI with the patient and updated documentation appropriately.          Comments    Patient here for 2 weeks retina follow up for s/p yag OD. Patient states vision OD still a little blurry. Sometimes it is better. Last couple of days was good. Hard to read. OD has eye pain at times.        Last edited by Bernarda Caffey, MD on 10/24/2020  1:09 AM. (History)    Pt feels like the yag helped for a few days, but then her vision got blurry again, she used the drops for a week as directed  Referring physician: Sharilyn Sites, MD [place],  [place] [address]  HISTORICAL INFORMATION:   Selected notes from the MEDICAL RECORD NUMBER Referred by Dr. Madelin Headings for concern of SRF OD LEE: 11.27.20 (M. Cotter) [BCVA: OD: 20/80-- OS: 20/60-]  Ocular Hx-glaucoma (latanoprost)  PMH-DM    CURRENT MEDICATIONS: Current Outpatient Medications (Ophthalmic Drugs)  Medication Sig  . brimonidine (ALPHAGAN) 0.2 % ophthalmic solution Place 1 drop into the right eye 2 (two) times daily. (Patient not taking: Reported on 10/08/2020)  . dorzolamide-timolol (COSOPT) 22.3-6.8 MG/ML ophthalmic solution INSTILL 1 DROP INTO RIGHT EYE TWICE A DAY  . latanoprost (XALATAN) 0.005 % ophthalmic solution Place 1 drop into both eyes at bedtime.   [name] tetrahydrozoline 0.05 % ophthalmic solution Place 1 drop into both eyes at bedtime.  (Patient not taking: Reported on 10/08/2020)   No current facility-administered medications for this visit. (Ophthalmic Drugs)   Current Outpatient Medications (Other)  Medication Sig  . albuterol (PROVENTIL  HFA;VENTOLIN HFA) 108 (90 BASE) MCG/ACT inhaler Inhale 2 puffs into the lungs every 4 (four) hours as needed for shortness of breath.  . Alirocumab (PRALUENT) 75 MG/ML SOAJ Inject 75 mg into the skin every 14 (fourteen) days.  [name] amitriptyline (ELAVIL) 25 MG tablet Take 50 mg by mouth at bedtime.   . Black Cohosh 40 MG CAPS Take 40 mg by mouth 2 (two) times daily.  . carvedilol (COREG) 6.25 MG tablet Take 1 tablet (6.25 mg total) by mouth 2 (two) times daily.  . cetirizine (ZYRTEC) 10 MG tablet Take 10 mg by mouth daily.  . Cholecalciferol (VITAMIN D-3) 1000 units CAPS Take 1 capsule by mouth daily.  . diazepam (VALIUM) 2 MG tablet Take by mouth.  . esomeprazole (NEXIUM) 20 MG capsule Take 20 mg by mouth daily at 12 noon.  [name] FARXIGA 5 MG TABS tablet Take 5 mg by mouth every morning.  . furosemide (LASIX) 40 MG tablet TAKE 1 TABLET (40 MG TOTAL) BY MOUTH DAILY. MAY TAKE EXTRA DAILY AS NEEDED FOR SWELLING  . gabapentin (NEURONTIN) 300 MG capsule Take 300 mg by mouth 3 (three) times daily.  [name] glimepiride (AMARYL) 2 MG tablet Take 2 mg by mouth daily.  . hydrALAZINE (APRESOLINE) 25 MG tablet Take 1 tablet (25 mg total) by mouth in the morning and at bedtime.  . isosorbide mononitrate (IMDUR) 30 MG 24 hr tablet Take 1 tablet (30  mg total) by mouth daily.  . meclizine (ANTIVERT) 25 MG tablet Take 25 mg by mouth as needed for dizziness.   . Omega-3 Fatty Acids (FISH OIL) 1200 MG CAPS Take 1 capsule by mouth 2 (two) times daily.  . polyethylene glycol (MIRALAX / GLYCOLAX) packet Take 17 g by mouth daily.  . potassium chloride SA (KLOR-CON) 20 MEQ tablet Take 0.5 tablets (10 mEq total) by mouth daily.  . TRADJENTA 5 MG TABS tablet Take 5 mg by mouth daily.  . vitamin B-12 (CYANOCOBALAMIN) 100 MCG tablet Take 1 tablet by mouth daily.  [name] warfarin (COUMADIN) 2 MG tablet Take 2 mg by mouth as directed.   No current facility-administered medications for this visit. (Other)      REVIEW OF SYSTEMS: ROS     Positive for: Genitourinary, Endocrine, Cardiovascular, Eyes   Negative for: Constitutional, Gastrointestinal, Neurological, Skin, Musculoskeletal, HENT, Respiratory, Psychiatric, Allergic/Imm, Heme/Lymph   Last edited by Theodore Demark, COA on 10/22/2020  8:50 AM. (History)       ALLERGIES Allergies  Allergen Reactions  . Vioxx [Rofecoxib] Shortness Of Breath  . Metformin And Related     Kidney failure  . Nexlizet [Bempedoic Acid-Ezetimibe]     Causes elevated Liver and Kidney function  . Penicillins     rash  . Pravastatin   . Repatha [Evolocumab]     MYALGIAS  . Codeine Rash  . Elemental Sulfur Rash  . Motrin [Ibuprofen] Rash    PAST MEDICAL HISTORY Past Medical History:  Diagnosis Date  . Antral gastritis    EGD 11/15  . Asthmatic bronchitis   . Back pain   . Chronic diastolic heart failure (HCC) 05/20/2015   Grade 2 diastolic dysfunction.  04/2015.  [name] Chronic kidney disease    kidney function low  . Diabetes mellitus    x 5 yrs  . DVT of axillary vein, acute left ([place]) 07/24/12  . GERD (gastroesophageal reflux disease)   . Glaucoma    POAG OU  . Hyperlipidemia 05/20/2015  . Hypertension   . Hypertensive retinopathy    OU  . Hypothyroidism   . Kidney stones   . Macular degeneration    Wet OD, Dry OS  . Mixed hyperlipidemia   . Peripheral venous insufficiency   . Pinched nerve    right elbow  . Sigmoid diverticulitis   . Vertigo    chonic   Past Surgical History:  Procedure Laterality Date  . ABDOMINAL HYSTERECTOMY    . BACK SURGERY     spinal   . CARDIAC CATHETERIZATION N/A 05/26/2015   Procedure: Left Heart Cath and Coronary Angiography;  Surgeon: Jettie Booze, MD;  Location: [facility];  Service: Cardiovascular;  Laterality: N/A;  . CATARACT EXTRACTION Bilateral   . CHOLECYSTECTOMY    . COLON SURGERY    . COLONOSCOPY N/A 12/27/2013   Procedure: COLONOSCOPY;  Surgeon: Rogene Houston, MD;  [facility]: [facility];  Service:  Endoscopy;  Laterality: N/A;  200  . COLOSTOMY CLOSURE    . ESOPHAGOGASTRODUODENOSCOPY N/A 05/07/2014   Procedure: ESOPHAGOGASTRODUODENOSCOPY (EGD);  Surgeon: Rogene Houston, MD;  Location: [facility];  Service: Endoscopy;  Laterality: N/A;  . EYE SURGERY Bilateral    Cat Sx  . fracture left foot    . HERNIA REPAIR    . NM MYOCAR PERF WALL MOTION  01/28/2009   Normal  . OTHER SURGICAL HISTORY     colostomy, colostomy reversal, for diverticulitis surgical hernia  repair, arm surgery, neck surgery  . US ECHOCARDIOGRAPHY  02/11/2010   Mild MR,trace TR & AI    FAMILY HISTORY Family History  Problem Relation Age of Onset  . Other Mother 86       Cause unknown  . Cancer Mother        liver  . CVA Maternal Grandmother 90       deceased  . Heart disease Maternal Grandmother   . Stroke Maternal Grandmother   . Heart attack Brother 72       deceased  . Cancer Sister 65       deceased  . Glaucoma Maternal Uncle     SOCIAL HISTORY Social History   Tobacco Use  . Smoking status: Former Smoker    Types: Cigarettes    Quit date: 12/10/2013    Years since quitting: 6.8  . Smokeless tobacco: Never Used  . Tobacco comment: Smoke 1-1 1/2 packs a day  Substance Use Topics  . Alcohol use: No  . Drug use: No         OPHTHALMIC EXAM:  Base Eye Exam    Visual Acuity (Snellen - Linear)      Right Left   Dist [place] 20/60 +2 20/40 +2   Dist ph [place] 20/40 -1 20/30 -2       Tonometry (Tonopen, 8:46 AM)      Right Left   Pressure 18 18       Pupils      Dark Light Shape React APD   Right 2 1 Round Brisk None   Left 2 1 Round Brisk None       Visual Fields (Counting fingers)      Left Right    Full Full       Extraocular Movement      Right Left    Full, Ortho Full, Ortho       Neuro/Psych    Oriented x3: Yes   Mood/Affect: Normal       Dilation    Both eyes: 1.0% Mydriacyl, 2.5% Phenylephrine @ 8:46 AM        Slit Lamp and Fundus Exam    Slit Lamp Exam       Right Left   Lids/Lashes Dermatochalasis - upper lid, mild Meibomian gland dysfunction Dermatochalasis - upper lid, Telangiectasia, mild Meibomian gland dysfunction   Conjunctiva/Sclera White and quiet White and quiet   Cornea 3+Punctate epithelial erosions, mild arcus, central haze 1-2+ fine Punctate epithelial erosions, central haze   Anterior Chamber Deep and quiet, narrow temporal angle Deep and quiet, narrow temporal angle   Iris Round and dilated Round and moderately dilated to 5.75mm   Lens Posterior chamber intraocular lens, open PC Posterior chamber intraocular lens   Vitreous Vitreous syneresis, Posterior vitreous detachment Vitreous syneresis       Fundus Exam      Right Left   Disc Sharp rim, Pallor, +cupping, mild, temporal Peripapillary atrophy, superior rim thinning Pink and Sharp, temporal Peripapillary atrophy   C/D Ratio 0.75 0.5   Macula Flat, Blunted foveal reflex, +focal CNV/PED nasal macula with partial pigment ring, stable improvement in SRF/IRF/cystic changes, Drusen, no heme, RPE mottling and clumping Flat, Blunted foveal reflex, drusen, trace ERM, Retinal pigment epithelial mottling and clumping, No heme or edema   Vessels Vascular attenuation, Tortuous Vascular attenuation, Tortuous   Periphery Attached, mild reticular degeneration, No heme, No RT/RD Attached; no heme          IMAGING  AND PROCEDURES  Imaging and Procedures for @TODAY @  OCT, Retina - OU - Both Eyes       Right Eye Quality was good. Central Foveal Thickness: 195. Progression has been stable. Findings include intraretinal hyper-reflective material, pigment epithelial detachment, subretinal hyper-reflective material, retinal drusen , normal foveal contour, outer retinal atrophy, no IRF, subretinal fluid (Stable improvement in IRF/cystic changes and SRF overlying PED nasal macula).   Left Eye Quality was good. Central Foveal Thickness: 208. Progression has been stable. Findings include normal  foveal contour, no IRF, no SRF, retinal drusen .   Notes *Images captured and stored on drive  Diagnosis / Impression:  OD: exudative ARMD; Stable improvement in IRF/cystic changes and SRF overlying PED nasal macula OS: NFP, no IRF/SRF; +drusen -- nonexudative ARMD  Clinical management:  See below  Abbreviations: NFP - Normal foveal profile. CME - cystoid macular edema. PED - pigment epithelial detachment. IRF - intraretinal fluid. SRF - subretinal fluid. EZ - ellipsoid zone. ERM - epiretinal membrane. ORA - outer retinal atrophy. ORT - outer retinal tubulation. SRHM - subretinal hyper-reflective material                 ASSESSMENT/PLAN:    ICD-10-CM   1. Exudative age-related macular degeneration of right eye with active choroidal neovascularization ([place])  H35.3211   2. Retinal edema  H35.81 OCT, Retina - OU - Both Eyes  3. Intermediate stage nonexudative age-related macular degeneration of left eye  H35.3122   4. Diabetes mellitus type 2 without retinopathy ([place])  E11.9   5. Essential hypertension  I10   6. Hypertensive retinopathy of both eyes  H35.033   7. Pseudophakia of both eyes  Z96.1   8. PCO (posterior capsular opacification), right  H26.491   9. Primary open angle glaucoma of both eyes, unspecified glaucoma stage  H40.1130    1,2. 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, OD    - s/p IVA OD #1 (12.11.20), #2 (02.12.21), #3 (03.12.21), #4 (04.09.21), #5 (05.14.21), #6 (06.17.21), #7 (07.23.21), #8 (10.15.21), #9 (12.7.21), #10 (03.22.22)  - delayed follow up from 4 weeks to 8 weeks due to passing of husband (12.11.20-02.12.21)  - FA 12.11.20 confirms +CNVM  - OCT today shows stable improvement in IRF/SRF overlying nasal PED  - BCVA OD 20/40 (stable)   - Avastin informed consent form signed and scanned on 12.11.2020 (OD)  - of note, pt presented acutely on 03.16.21 due to decreased vision after injection OD -- ?post injection epi defect -- no epi defect today              - Continue to use AT OD  - f/u May 31 or later -- DFE/OCT/possible injection; treat and extend as able  3. Age related macular degeneration, non-exudative, OS  - intermediate stage  - The incidence, anatomy, and pathology of dry AMD, risk of progression, and the AREDS and AREDS 2 study including smoking risks discussed with patient.  - recommend Amsler grid monitoring  4. Diabetes mellitus, type 2 without retinopathy  - The incidence, risk factors for progression, natural history and treatment options for diabetic retinopathy  were discussed with patient.    - The need for close monitoring of blood glucose, blood pressure, and serum lipids, avoiding cigarette or any type of tobacco, and the need for long term follow up was also discussed with patient.  - monitor  5,6. Hypertensive retinopathy OU  - discussed importance of tight BP control  - monitor  7,8. Pseudophakia OU,  PCO OD  - s/p CE/IOL OU (Dr. Venetia Maxon)  - IOL in good position  - s/p yag cap OD (04.06.22) -- good PC opening  - completed Lotemax SM QID OD x7 days  - monitor  9. POAG OU  - formerly managed by Dr. Venetia Maxon  - s/p laser w/ Dr. Venetia Maxon -- ?SLT  - IOP 18 OU  - currently on latanoprost QHS OU  - cont Cosopt BID OD only  - add brimonidine BID OD only -- pt self d/c due to itching  - monitor  Ophthalmic Meds Ordered this visit:  No orders of the defined types were placed in this encounter.     Return for f/u May 31 or later, exu ARMD OD, DFE, OCT.  There are no Patient Instructions on file for this visit.   Explained the diagnoses, plan, and follow up with the patient and they expressed understanding.  Patient expressed understanding of the importance of proper follow up care.   This document serves as a record of services personally performed by Gardiner Sleeper, MD, PhD. It was created on their behalf by Roselee Nova, COMT. The creation of this record is the provider's dictation and/or activities  during the visit.  Electronically signed by: Roselee Nova, COMT 10/24/20 1:26 AM   This document serves as a record of services personally performed by Gardiner Sleeper, MD, PhD. It was created on their behalf by San Jetty. Owens Shark, OA an ophthalmic technician. The creation of this record is the provider's dictation and/or activities during the visit.    Electronically signed by: San Jetty. [place], [place] 04.20.2022 1:26 AM   Gardiner Sleeper, M.D., Ph.D. Diseases & Surgery of the Retina and Vitreous [facility]  I have reviewed the above documentation for accuracy and completeness, and I agree with the above. Gardiner Sleeper, M.D., Ph.D. 10/24/20 1:26 AM  Abbreviations: M myopia (nearsighted); A astigmatism; H hyperopia (farsighted); P presbyopia; Mrx spectacle prescription;  CTL contact lenses; OD right eye; OS left eye; OU both eyes  XT exotropia; ET esotropia; PEK punctate epithelial keratitis; PEE punctate epithelial erosions; DES dry eye syndrome; MGD meibomian gland dysfunction; ATs artificial tears; PFAT's preservative free artificial tears; [place] nuclear sclerotic cataract; PSC posterior subcapsular cataract; ERM epi-retinal membrane; PVD posterior vitreous detachment; RD retinal detachment; DM diabetes mellitus; DR diabetic retinopathy; NPDR non-proliferative diabetic retinopathy; PDR proliferative diabetic retinopathy; CSME clinically significant macular edema; DME diabetic macular edema; dbh dot blot hemorrhages; CWS cotton wool spot; POAG primary open angle glaucoma; C/D cup-to-disc ratio; HVF humphrey visual field; GVF goldmann visual field; OCT optical coherence tomography; IOP intraocular pressure; BRVO Branch retinal vein occlusion; CRVO central retinal vein occlusion; CRAO central retinal artery occlusion; BRAO branch retinal artery occlusion; RT retinal tear; SB scleral buckle; PPV pars plana vitrectomy; VH Vitreous hemorrhage; PRP panretinal laser photocoagulation; IVK  intravitreal kenalog; VMT vitreomacular traction; MH Macular hole;  NVD neovascularization of the disc; NVE neovascularization elsewhere; AREDS age related eye disease study; ARMD age related macular degeneration; POAG primary open angle glaucoma; EBMD epithelial/anterior basement membrane dystrophy; ACIOL anterior chamber intraocular lens; IOL intraocular lens; PCIOL posterior chamber intraocular lens; Phaco/IOL phacoemulsification with intraocular lens placement; [place] photorefractive keratectomy; LASIK laser assisted in situ keratomileusis; HTN hypertension; DM diabetes mellitus; COPD chronic obstructive pulmonary disease

## 2020-10-22 ENCOUNTER — Other Ambulatory Visit: Payer: Self-pay

## 2020-10-22 ENCOUNTER — Encounter (INDEPENDENT_AMBULATORY_CARE_PROVIDER_SITE_OTHER): Payer: Self-pay | Admitting: Ophthalmology

## 2020-10-22 ENCOUNTER — Ambulatory Visit (INDEPENDENT_AMBULATORY_CARE_PROVIDER_SITE_OTHER): Payer: Medicare Other | Admitting: Ophthalmology

## 2020-10-22 DIAGNOSIS — H353211 Exudative age-related macular degeneration, right eye, with active choroidal neovascularization: Secondary | ICD-10-CM | POA: Diagnosis not present

## 2020-10-22 DIAGNOSIS — H35033 Hypertensive retinopathy, bilateral: Secondary | ICD-10-CM | POA: Diagnosis not present

## 2020-10-22 DIAGNOSIS — Z961 Presence of intraocular lens: Secondary | ICD-10-CM

## 2020-10-22 DIAGNOSIS — H353122 Nonexudative age-related macular degeneration, left eye, intermediate dry stage: Secondary | ICD-10-CM | POA: Diagnosis not present

## 2020-10-22 DIAGNOSIS — I10 Essential (primary) hypertension: Secondary | ICD-10-CM | POA: Diagnosis not present

## 2020-10-22 DIAGNOSIS — H401130 Primary open-angle glaucoma, bilateral, stage unspecified: Secondary | ICD-10-CM

## 2020-10-22 DIAGNOSIS — H3581 Retinal edema: Secondary | ICD-10-CM

## 2020-10-22 DIAGNOSIS — E119 Type 2 diabetes mellitus without complications: Secondary | ICD-10-CM | POA: Diagnosis not present

## 2020-10-22 DIAGNOSIS — H26491 Other secondary cataract, right eye: Secondary | ICD-10-CM

## 2020-10-24 ENCOUNTER — Encounter (INDEPENDENT_AMBULATORY_CARE_PROVIDER_SITE_OTHER): Payer: Self-pay | Admitting: Ophthalmology

## 2020-10-27 ENCOUNTER — Ambulatory Visit (INDEPENDENT_AMBULATORY_CARE_PROVIDER_SITE_OTHER): Payer: Medicare Other | Admitting: *Deleted

## 2020-10-27 DIAGNOSIS — Z5181 Encounter for therapeutic drug level monitoring: Secondary | ICD-10-CM | POA: Diagnosis not present

## 2020-10-27 DIAGNOSIS — I82A12 Acute embolism and thrombosis of left axillary vein: Secondary | ICD-10-CM

## 2020-10-27 LAB — POCT INR: INR: 1.9 — AB (ref 2.0–3.0)

## 2020-10-27 NOTE — Patient Instructions (Signed)
Take warfarin 1 1/2 tablets tonight then increase dose to 1 tablet daily except 1/2 tablet on Fridays Recheck in 3 weeks

## 2020-11-01 DIAGNOSIS — I2510 Atherosclerotic heart disease of native coronary artery without angina pectoris: Secondary | ICD-10-CM | POA: Diagnosis not present

## 2020-11-01 DIAGNOSIS — E1165 Type 2 diabetes mellitus with hyperglycemia: Secondary | ICD-10-CM | POA: Diagnosis not present

## 2020-11-10 ENCOUNTER — Ambulatory Visit: Payer: Self-pay | Admitting: Surgery

## 2020-11-10 ENCOUNTER — Telehealth: Payer: Self-pay | Admitting: *Deleted

## 2020-11-10 DIAGNOSIS — R920 Mammographic microcalcification found on diagnostic imaging of breast: Secondary | ICD-10-CM | POA: Diagnosis not present

## 2020-11-10 DIAGNOSIS — N6091 Unspecified benign mammary dysplasia of right breast: Secondary | ICD-10-CM

## 2020-11-10 NOTE — Telephone Encounter (Signed)
   [facility] Pre-operative Risk Assessment    Patient Name: [name]  DOB: April 15, 1941  MRN: [id number]   HEARTCARE STAFF: - Please ensure there is not already an duplicate clearance open for this procedure. - Under Visit Info/Reason for Call, type in Other and utilize the format Clearance MM/DD/YY or Clearance TBD. Do not use dashes or single digits. - If request is for dental extraction, please clarify the # of teeth to be extracted.  Request for surgical clearance:  1. What type of surgery is being performed? localized lumpectomy   2. When is this surgery scheduled? TBD   3. What type of clearance is required (medical clearance vs. Pharmacy clearance to hold med vs. Both)? both  4. Are there any medications that need to be held prior to surgery and how long?warfarin-need direction   5. Practice name and name of physician performing surgery? [facility]   6. What is the office phone number? [phone]   7.   What is the office fax number? [phone]  8.   Anesthesia type (None, local, MAC, general) ? general   Fredia Beets 11/10/2020, 1:00 PM  _________________________________________________________________   (provider comments below)

## 2020-11-10 NOTE — H&P (View-Only) (Signed)
History of Present Illness [name]. Darlene Glasner MD; 11-28-20 12:27 PM) The patient is a 80 year old female who presents with a breast mass. PCP - Dr. Sharilyn Sites  Reason: Right breast ADH  An 80 year old female with all medical issues including congestive heart failure, diabetes, hypertension, anticoagulated on Coumadin for DVT. She presents after routine screening mammogram showed an area of calcifications in the right breast. Further workup showed a 2.8 x 1.6 cm group of linear and fine pleomorphic calcifications in the right upper inner quadrant. She has a 7 mm cyst in her central right breast. She underwent biopsy of the center of this area of calcifications. This revealed focal atypical ductal hyperplasia and vascular calcifications. Due to the wide area of calcifications and the risk for sampling error, she is referred for discussion of lumpectomy. The patient has a family history of breast cancer in a sister and also in her maternal grandmother.   Problem List/Past Medical Darlene Key K. Miklos Bidinger, MD; 11/28/20 12:27 PM) ATYPICAL DUCTAL HYPERPLASIA OF RIGHT BREAST (N60.91)  MICROCALCIFICATIONS OF THE BREAST (R92.0)   Past Surgical History [name], CMA; 11/28/20 11:07 AM) Cataract Surgery  Bilateral. Colon Polyp Removal - Colonoscopy  Gallbladder Surgery - Laparoscopic  Hysterectomy (not due to cancer) - Partial  Spinal Surgery - Neck   Diagnostic Studies History [name], CMA; 2020/11/28 11:07 AM) Colonoscopy  5-10 years ago Mammogram  within last year Pap Smear  1-5 years ago  Allergies [name], CMA; Nov 28, 2020 11:07 AM) Allergies Reconciled   Medication History [name], CMA; Nov 28, 2020 11:08 AM) diazePAM (2MG  Tablet, Oral) Active. Albuterol Sulfate HFA (108 (90 Base)MCG/ACT Aerosol Soln, Inhalation) Active. Amoxicillin-Pot Clavulanate (875-125MG  Tablet, Oral) Active. Amitriptyline HCl (25MG  Tablet, Oral) Active. Farxiga (5MG  Tablet, Oral)  Active. Furosemide (40MG  Tablet, Oral) Active. Gabapentin (300MG  Capsule, Oral) Active. Glimepiride (2MG  Tablet, Oral) Active. hydrALAZINE HCl (25MG  Tablet, Oral) Active. Meclizine HCl (25MG  Tablet, Oral) Active. methylPREDNISolone (4MG  Tab Ther Pack, Oral) Active. Warfarin Sodium (2MG  Tablet, Oral) Active. Tradjenta (5MG  Tablet, Oral) Active. Medications Reconciled  Social History [name], [place]; 28-Nov-2020 11:07 AM) Caffeine use  Carbonated beverages.  Family History [name], [place]; 28-Nov-2020 11:07 AM) Alcohol Abuse  Brother. Anesthetic complications  Family Members In General. Breast Cancer  Sister. Cancer  Mother. Cervical Cancer  Sister. Diabetes Mellitus  Family Members In General. Heart disease in female family member before age 7  Hypertension  Family Members In General. Ovarian Cancer  Sister. Thyroid problems  Family Members In General.  Pregnancy / Birth History [name], [place]; 28-Nov-2020 11:07 AM) Age at menarche  71 years. Contraceptive History  Oral contraceptives. Gravida  3 Maternal age  86-25 Para  3  Other Problems [name]. Darlene Passow, MD; 11-28-2020 12:27 PM) Asthma  Back Pain  Diabetes Mellitus  Diverticulosis  Gastroesophageal Reflux Disease  Heart murmur  High blood pressure  Hypercholesterolemia  Kidney Stone  Migraine Headache  Other disease, cancer, significant illness  Thyroid Disease     Review of Systems [name] CMA; Nov 28, 2020 11:07 AM) General Present- Fatigue and Night Sweats. Not Present- Appetite Loss, Chills, Fever, Weight Gain and Weight Loss. Skin Present- Change in Wart/Mole and Dryness. Not Present- Hives, Jaundice, New Lesions, Non-Healing Wounds, Rash and Ulcer. HEENT Present- Nose Bleed, Ringing in the Ears, Seasonal Allergies, Sinus Pain and Visual Disturbances. Not Present- Earache, Hearing Loss, Hoarseness, Oral Ulcers, Sore Throat, Wears glasses/contact lenses and Yellow Eyes. Breast  Present- Breast Pain. Not Present- Breast Mass, Nipple Discharge and Skin Changes.  Cardiovascular Present- Chest Pain, Leg Cramps and Swelling of Extremities. Not Present- Difficulty Breathing Lying Down, Palpitations, Rapid Heart Rate and Shortness of Breath. Gastrointestinal Present- Excessive gas and Indigestion. Not Present- Abdominal Pain, Bloating, Bloody Stool, Change in Bowel Habits, Chronic diarrhea, Constipation, Difficulty Swallowing, Gets full quickly at meals, Hemorrhoids, Nausea, Rectal Pain and Vomiting. Female Genitourinary Present- Urgency. Not Present- Frequency, Nocturia, Painful Urination and Pelvic Pain. Musculoskeletal Present- Back Pain. Not Present- Joint Pain, Joint Stiffness, Muscle Pain, Muscle Weakness and Swelling of Extremities. Endocrine Present- Heat Intolerance, Hot flashes and New Diabetes. Not Present- Cold Intolerance, Excessive Hunger and Hair Changes. Hematology Present- Blood Thinners and Easy Bruising. Not Present- Excessive bleeding, Gland problems, HIV and Persistent Infections.  Vitals Arville Go Fox CMA; 11/10/2020 11:08 AM) 11/10/2020 11:08 AM Weight: 176.5 lb Height: 63in Body Surface Area: 1.83 m Body Mass Index: 31.27 kg/m  Temp.: 97.28F  Pulse: 89 (Regular)  P.OX: 94% (Room air) BP: 140/78(Sitting, Left Arm, Standard)       Physical Exam Darlene Key K. Ashtan Laton MD; 11/10/2020 12:28 PM) The physical exam findings are as follows: Note: Constitutional: WDWN in NAD, conversant, no obvious deformities; resting comfortably Eyes: Pupils equal, round; sclera anicteric; moist conjunctiva; no lid lag HENT: Oral mucosa moist; good dentition Neck: No masses palpated, trachea midline; no thyromegaly Lungs: CTA bilaterally; normal respiratory effort Breasts: Symmetric, no nipple retraction or discharge, no axillary lymphadenopathy, no palpable masses in either breast, healing biopsy site in the right upper quadrant. CV: Regular rate and rhythm; no  murmurs; extremities well-perfused with no edema Abd: +bowel sounds, soft, non-tender, no palpable organomegaly; no palpable hernias Musc: Normal gait; no apparent clubbing or cyanosis in extremities Lymphatic: No palpable cervical or axillary lymphadenopathy Skin: Warm, dry; no sign of jaundice Psychiatric - alert and oriented x 4; calm mood and affect    Assessment & Plan Darlene Key K. Kamali Nephew MD; 11/10/2020 11:20 AM) ATYPICAL DUCTAL HYPERPLASIA OF RIGHT BREAST (N60.91) MICROCALCIFICATIONS OF THE BREAST (R92.0) Current Plans Schedule for Surgery - Right radioactive seed localized lumpectomy. The surgical procedure has been discussed with the patient. Potential risks, benefits, alternative treatments, and expected outcomes have been explained. All of the patient's questions at this time have been answered. The likelihood of reaching the patient's treatment goal is good. The patient understand the proposed surgical procedure and wishes to proceed.    [name]. Georgette Dover, MD, [facility]  General/ Trauma Surgery   11/10/2020 12:28 PM

## 2020-11-10 NOTE — Telephone Encounter (Signed)
Patient with diagnosis of DVT (2014) on warfarin for anticoagulation.    Procedure: localized lumpectomy  Date of procedure: TBD  Per office protocol, patient can hold warfarin for 5 days prior to procedure.   Patient will NOT need bridging with Lovenox (enoxaparin) around procedure.  Patient's DVT was 8 years ago. Please reassess if anticoagulation is still needed.

## 2020-11-10 NOTE — H&P (Signed)
History of Present Illness [name]. Darlene Tatro MD; 2020/11/20 12:27 PM) The patient is a 80 year old female who presents with a breast mass. PCP - Dr. Sharilyn Sites  Reason: Right breast ADH  An 80 year old female with all medical issues including congestive heart failure, diabetes, hypertension, anticoagulated on Coumadin for DVT. She presents after routine screening mammogram showed an area of calcifications in the right breast. Further workup showed a 2.8 x 1.6 cm group of linear and fine pleomorphic calcifications in the right upper inner quadrant. She has a 7 mm cyst in her central right breast. She underwent biopsy of the center of this area of calcifications. This revealed focal atypical ductal hyperplasia and vascular calcifications. Due to the wide area of calcifications and the risk for sampling error, she is referred for discussion of lumpectomy. The patient has a family history of breast cancer in a sister and also in her maternal grandmother.   Problem List/Past Medical Darlene Key K. Marguerite Jarboe, MD; 11/20/20 12:27 PM) ATYPICAL DUCTAL HYPERPLASIA OF RIGHT BREAST (N60.91)  MICROCALCIFICATIONS OF THE BREAST (R92.0)   Past Surgical History [name], CMA; 11/20/20 11:07 AM) Cataract Surgery  Bilateral. Colon Polyp Removal - Colonoscopy  Gallbladder Surgery - Laparoscopic  Hysterectomy (not due to cancer) - Partial  Spinal Surgery - Neck   Diagnostic Studies History [name], CMA; 2020-11-20 11:07 AM) Colonoscopy  5-10 years ago Mammogram  within last year Pap Smear  1-5 years ago  Allergies [name], CMA; 20-Nov-2020 11:07 AM) Allergies Reconciled   Medication History [name], CMA; 11-20-2020 11:08 AM) diazePAM (2MG  Tablet, Oral) Active. Albuterol Sulfate HFA (108 (90 Base)MCG/ACT Aerosol Soln, Inhalation) Active. Amoxicillin-Pot Clavulanate (875-125MG  Tablet, Oral) Active. Amitriptyline HCl (25MG  Tablet, Oral) Active. Farxiga (5MG  Tablet, Oral)  Active. Furosemide (40MG  Tablet, Oral) Active. Gabapentin (300MG  Capsule, Oral) Active. Glimepiride (2MG  Tablet, Oral) Active. hydrALAZINE HCl (25MG  Tablet, Oral) Active. Meclizine HCl (25MG  Tablet, Oral) Active. methylPREDNISolone (4MG  Tab Ther Pack, Oral) Active. Warfarin Sodium (2MG  Tablet, Oral) Active. Tradjenta (5MG  Tablet, Oral) Active. Medications Reconciled  Social History [name], [place]; Nov 20, 2020 11:07 AM) Caffeine use  Carbonated beverages.  Family History [name], [place]; Nov 20, 2020 11:07 AM) Alcohol Abuse  Brother. Anesthetic complications  Family Members In General. Breast Cancer  Sister. Cancer  Mother. Cervical Cancer  Sister. Diabetes Mellitus  Family Members In General. Heart disease in female family member before age 20  Hypertension  Family Members In General. Ovarian Cancer  Sister. Thyroid problems  Family Members In General.  Pregnancy / Birth History [name], [place]; 11-20-20 11:07 AM) Age at menarche  59 years. Contraceptive History  Oral contraceptives. Gravida  3 Maternal age  67-25 Para  3  Other Problems [name]. Darlene Holzhauer, MD; 11/20/20 12:27 PM) Asthma  Back Pain  Diabetes Mellitus  Diverticulosis  Gastroesophageal Reflux Disease  Heart murmur  High blood pressure  Hypercholesterolemia  Kidney Stone  Migraine Headache  Other disease, cancer, significant illness  Thyroid Disease     Review of Systems [name] CMA; 2020-11-20 11:07 AM) General Present- Fatigue and Night Sweats. Not Present- Appetite Loss, Chills, Fever, Weight Gain and Weight Loss. Skin Present- Change in Wart/Mole and Dryness. Not Present- Hives, Jaundice, New Lesions, Non-Healing Wounds, Rash and Ulcer. HEENT Present- Nose Bleed, Ringing in the Ears, Seasonal Allergies, Sinus Pain and Visual Disturbances. Not Present- Earache, Hearing Loss, Hoarseness, Oral Ulcers, Sore Throat, Wears glasses/contact lenses and Yellow Eyes. Breast  Present- Breast Pain. Not Present- Breast Mass, Nipple Discharge and Skin Changes.  Cardiovascular Present- Chest Pain, Leg Cramps and Swelling of Extremities. Not Present- Difficulty Breathing Lying Down, Palpitations, Rapid Heart Rate and Shortness of Breath. Gastrointestinal Present- Excessive gas and Indigestion. Not Present- Abdominal Pain, Bloating, Bloody Stool, Change in Bowel Habits, Chronic diarrhea, Constipation, Difficulty Swallowing, Gets full quickly at meals, Hemorrhoids, Nausea, Rectal Pain and Vomiting. Female Genitourinary Present- Urgency. Not Present- Frequency, Nocturia, Painful Urination and Pelvic Pain. Musculoskeletal Present- Back Pain. Not Present- Joint Pain, Joint Stiffness, Muscle Pain, Muscle Weakness and Swelling of Extremities. Endocrine Present- Heat Intolerance, Hot flashes and New Diabetes. Not Present- Cold Intolerance, Excessive Hunger and Hair Changes. Hematology Present- Blood Thinners and Easy Bruising. Not Present- Excessive bleeding, Gland problems, HIV and Persistent Infections.  Vitals Arville Go Fox CMA; 11/10/2020 11:08 AM) 11/10/2020 11:08 AM Weight: 176.5 lb Height: 63in Body Surface Area: 1.83 m Body Mass Index: 31.27 kg/m  Temp.: 97.28F  Pulse: 89 (Regular)  P.OX: 94% (Room air) BP: 140/78(Sitting, Left Arm, Standard)       Physical Exam Darlene Key K. Florrie Ramires MD; 11/10/2020 12:28 PM) The physical exam findings are as follows: Note: Constitutional: WDWN in NAD, conversant, no obvious deformities; resting comfortably Eyes: Pupils equal, round; sclera anicteric; moist conjunctiva; no lid lag HENT: Oral mucosa moist; good dentition Neck: No masses palpated, trachea midline; no thyromegaly Lungs: CTA bilaterally; normal respiratory effort Breasts: Symmetric, no nipple retraction or discharge, no axillary lymphadenopathy, no palpable masses in either breast, healing biopsy site in the right upper quadrant. CV: Regular rate and rhythm; no  murmurs; extremities well-perfused with no edema Abd: +bowel sounds, soft, non-tender, no palpable organomegaly; no palpable hernias Musc: Normal gait; no apparent clubbing or cyanosis in extremities Lymphatic: No palpable cervical or axillary lymphadenopathy Skin: Warm, dry; no sign of jaundice Psychiatric - alert and oriented x 4; calm mood and affect    Assessment & Plan Darlene Key K. Verlyn Lambert MD; 11/10/2020 11:20 AM) ATYPICAL DUCTAL HYPERPLASIA OF RIGHT BREAST (N60.91) MICROCALCIFICATIONS OF THE BREAST (R92.0) Current Plans Schedule for Surgery - Right radioactive seed localized lumpectomy. The surgical procedure has been discussed with the patient. Potential risks, benefits, alternative treatments, and expected outcomes have been explained. All of the patient's questions at this time have been answered. The likelihood of reaching the patient's treatment goal is good. The patient understand the proposed surgical procedure and wishes to proceed.    [name]. Darlene Dover, MD, [facility]  General/ Trauma Surgery   11/10/2020 12:28 PM

## 2020-11-12 ENCOUNTER — Other Ambulatory Visit: Payer: Self-pay | Admitting: Cardiovascular Disease

## 2020-11-12 MED ORDER — WARFARIN SODIUM 2 MG PO TABS: 2.0000 mg | ORAL_TABLET | ORAL | 0 refills | Status: DC

## 2020-11-12 NOTE — Telephone Encounter (Signed)
*  STAT* If patient is at the pharmacy, call can be transferred to refill team.   1. Which medications need to be refilled? (please list name of each medication and dose if known) warfarin (COUMADIN) 2 MG tablet  2. Which pharmacy/location (including street and city if local pharmacy) is medication to be sent to? [facility] - [place], [place] - [place] [address]  3. Do they need a 30 day or 90 day supply? 90 day supply  Moving forward All prescriptions from Dr. Oval Linsey  should be sent to [facility] in [place]

## 2020-11-12 NOTE — Telephone Encounter (Signed)
   Name: ATHZIRY MILLICAN  DOB: Mar 24, 1941  MRN: [id number]   Primary Cardiologist: Skeet Latch, MD  Chart reviewed as part of pre-operative protocol coverage. Patient was contacted 11/12/2020 in reference to pre-operative risk assessment for pending surgery as outlined below.  EMORIE MCFATE was last seen on 06/24/20 by Dr. Oval Linsey.  Since that day, KENNEDIE PARDOE has done fine from a cardiac standpoing. She works in her garden regularly and continues to stay active. She can complete 4 METs without anginal complaints.   Therefore, based on ACC/AHA guidelines, the patient would be at acceptable risk for the planned procedure without further cardiovascular testing.   The patient was advised that if she develops new symptoms prior to surgery to contact our office to arrange for a follow-up visit, and she verbalized understanding.  I discussed her DVT history. She tells me she has had 2 DVTs in the past and was recommended for lifelong anticoagulation by her PCP, Dr. Hilma Favors. Therefore will continue coumadin going forward. I will route to Dr. Malen Gauze as Juluis Rainier.  Per pharmacy recommendation, patient can hold coumadin 5 days prior to her upcoming procedure and should restart when cleared to do so by her surgeon.   I will route this recommendation to the requesting party via Epic fax function and remove from pre-op pool. Please call with questions.  Abigail Butts, PA-C 11/12/2020, 10:05 AM

## 2020-11-13 NOTE — Telephone Encounter (Signed)
Pt has INR appt on Monday 11/17/20.  Will arrange bridging instructions at that time.  Procedure date is still pending.

## 2020-11-13 NOTE — Telephone Encounter (Signed)
    [name] DOB:  January 10, 1941  MRN:  [id number]   Primary Cardiologist: Skeet Latch, MD  Chart reviewed as part of pre-operative protocol coverage. Given past medical history and time since last visit, based on ACC/AHA guidelines, [name] would be at acceptable risk for the planned procedure without further cardiovascular testing.   As she has had DVTx2 WILL require Lovenox bridge and this will be arranged with our Coumadin Clinic.  I will route this updated recommendation to the requesting party via Epic fax function and remove from pre-op pool.  Please call with questions.  Loel Dubonnet, NP 11/13/2020, 2:03 PM

## 2020-11-13 NOTE — Telephone Encounter (Signed)
Knowing now that she has had 2 DVT- the question becomes does she need a bridge?

## 2020-11-13 NOTE — Telephone Encounter (Signed)
Patient with diagnosis of 2 separate DVTs on warfarin for anticoagulation.    Procedure: localized lumpectomy Date of procedure: TBD  Per office protocol, patient can hold warfarin for 5 days prior to procedure.   Per Dr. Oval Linsey patient WILL need Lovenox bridge.  I will ask Edrick Oh at [facility] coumadin clinic to coordinate bridge.

## 2020-11-17 ENCOUNTER — Ambulatory Visit (INDEPENDENT_AMBULATORY_CARE_PROVIDER_SITE_OTHER): Payer: Medicare Other | Admitting: *Deleted

## 2020-11-17 DIAGNOSIS — Z5181 Encounter for therapeutic drug level monitoring: Secondary | ICD-10-CM

## 2020-11-17 DIAGNOSIS — I82A12 Acute embolism and thrombosis of left axillary vein: Secondary | ICD-10-CM

## 2020-11-17 LAB — POCT INR: INR: 2.1 (ref 2.0–3.0)

## 2020-11-17 NOTE — Patient Instructions (Signed)
Continue warfarin 1 tablet daily except 1/2 tablet on Fridays Recheck in 3 weeks Is pending breast lumpectomy.  Will hold warfarin 5 days before procedure and bridge with Lovenox.  Is having seed implants prior to procedure as well.  Pt will call when dates have been scheduled

## 2020-11-18 ENCOUNTER — Other Ambulatory Visit: Payer: Self-pay | Admitting: Surgery

## 2020-11-18 ENCOUNTER — Telehealth: Payer: Self-pay | Admitting: Cardiovascular Disease

## 2020-11-18 DIAGNOSIS — N6091 Unspecified benign mammary dysplasia of right breast: Secondary | ICD-10-CM

## 2020-11-18 NOTE — Telephone Encounter (Signed)
Spoke with pt.  She is have breast lumpectomy on 12/03/20 and Seed implants placed on 5/31.  She will hold warfarin 5 days before procedure and be bridged with Lovenox.  INR appt moved up to 11/26/20.  Will go over all instructions with pt at that time.  She verbalized understanding.

## 2020-11-18 NOTE — Telephone Encounter (Signed)
Patient is having surgery on 12/03/20

## 2020-11-24 NOTE — Progress Notes (Addendum)
Surgical Instructions    Your procedure is scheduled on June 1st Wednesday.  Report to [facility] Main Entrance "A" at 915 A.M., then check in with the Admitting office.  Call this number if you have problems the morning of surgery:  [phone]   If you have any questions prior to your surgery date call [phone]: Open Monday-Friday 8am-4pm    Remember:  Do not eat after midnight the night before your surgery  You may drink clear liquids until 8:15am the morning of your surgery.   Clear liquids allowed are: Water, Non-Citrus Juices (without pulp), Carbonated Beverages, Clear Tea, Black Coffee Only, and Gatorade    Take these medicines the morning of surgery with A SIP OF WATER   brimonidine (ALPHAGAN) 0.2 % ophthalmic solution  carvedilol (COREG) 6.25 MG tablet  cetirizine (ZYRTEC) 10 MG tablet  dorzolamide-timolol (COSOPT) 22.3-6.8 MG/ML ophthalmic solution  gabapentin (NEURONTIN) 300 MG capsule  hydrALAZINE (APRESOLINE) 25 MG tablet  isosorbide mononitrate (IMDUR) 30 MG 24 hr tablet    If needed  albuterol (PROVENTIL HFA;VENTOLIN HFA) 108 (90 BASE) MCG/ACT inhaler - Bring inhaler with you to the hospital  diazepam (VALIUM) 2 MG tablet   meclizine (ANTIVERT) 25 MG tablet  Hold warfarin 5 days prior to surgery  As of today, STOP taking any Aspirin (unless otherwise instructed by your surgeon) Aleve, Naproxen, Ibuprofen, Motrin, Advil, Goody's, BC's, all herbal medications, fish oil, and all vitamins.   WHAT DO I DO ABOUT MY DIABETES MEDICATION?   [name] Do not take any oral diabetes medicines (Farxiga, Tradjenta, Amaryl) the morning of surgery. Do not take Farxiga diabetes medicines the day before surgery. Do not take any evening dose of Amaryl the day before surgery. Morning dose is ok to take.     HOW TO MANAGE YOUR DIABETES BEFORE AND AFTER SURGERY  Why is it important to control my blood sugar before and after surgery? . Improving blood sugar levels before and  after surgery helps healing and can limit problems. . A way of improving blood sugar control is eating a healthy diet by: o  Eating less sugar and carbohydrates o  Increasing activity/exercise o  Talking with your doctor about reaching your blood sugar goals . High blood sugars (greater than 180 mg/dL) can raise your risk of infections and slow your recovery, so you will need to focus on controlling your diabetes during the weeks before surgery. . Make sure that the doctor who takes care of your diabetes knows about your planned surgery including the date and location.  How do I manage my blood sugar before surgery? . Check your blood sugar at least 4 times a day, starting 2 days before surgery, to make sure that the level is not too high or low.  . Check your blood sugar the morning of your surgery when you wake up and every 2 hours until you get to the Short Stay unit.  o If your blood sugar is less than 70 mg/dL, you will need to treat for low blood sugar: - Do not take insulin. - Treat a low blood sugar (less than 70 mg/dL) with  cup of clear juice (cranberry or apple), 4 glucose tablets, OR glucose gel. - Recheck blood sugar in 15 minutes after treatment (to make sure it is greater than 70 mg/dL). If your blood sugar is not greater than 70 mg/dL on recheck, call [phone] for further instructions. . Report your blood sugar to the short stay nurse when you get to  Short Stay.  . If you are admitted to the hospital after surgery: o Your blood sugar will be checked by the staff and you will probably be given insulin after surgery (instead of oral diabetes medicines) to make sure you have good blood sugar levels. o The goal for blood sugar control after surgery is 80-180 mg/dL.             Do not wear jewelry, make up, or nail polish            Do not wear lotions, powders, perfumes, or deodorant.            Do not shave 48 hours prior to surgery.              Do not bring valuables to  the hospital.            [facility] is not responsible for any belongings or valuables.  Do NOT Smoke (Tobacco/Vaping) or drink Alcohol 24 hours prior to your procedure If you use a CPAP at night, you may bring all equipment for your overnight stay.   Contacts, glasses, dentures or bridgework may not be worn into surgery, please bring cases for these belongings   For patients admitted to the hospital, discharge time will be determined by your treatment team.   Patients discharged the day of surgery will not be allowed to drive home, and someone needs to stay with them for 24 hours.    Special instructions:    Oral Hygiene is also important to reduce your risk of infection.  Remember - BRUSH YOUR TEETH THE MORNING OF SURGERY WITH YOUR REGULAR TOOTHPASTE   [place]- Preparing For Surgery  Before surgery, you can play an important role. Because skin is not sterile, your skin needs to be as free of germs as possible. You can reduce the number of germs on your skin by washing with CHG (chlorahexidine gluconate) Soap before surgery.  CHG is an antiseptic cleaner which kills germs and bonds with the skin to continue killing germs even after washing.     Please do not use if you have an allergy to CHG or antibacterial soaps. If your skin becomes reddened/irritated stop using the CHG.  Do not shave (including legs and underarms) for at least 48 hours prior to first CHG shower. It is OK to shave your face.  Please follow these instructions carefully.    1.  Shower the NIGHT BEFORE SURGERY and the MORNING OF SURGERY with CHG Soap.   If you chose to wash your hair, wash your hair first as usual with your normal shampoo. After you shampoo, rinse your hair and body thoroughly to remove the shampoo.  Then [employer] and genitals (private parts) with your normal soap and rinse thoroughly to remove soap.  2. After that Use CHG Soap as you would any other liquid soap. You can apply CHG directly to  the skin and wash gently with a scrungie or a clean washcloth.   3. Apply the CHG Soap to your body ONLY FROM THE NECK DOWN.  Do not use on open wounds or open sores. Avoid contact with your eyes, ears, mouth and genitals (private parts). Wash Face and genitals (private parts)  with your normal soap.   4. Wash thoroughly, paying special attention to the area where your surgery will be performed.  5. Thoroughly rinse your body with warm water from the neck down.  6. DO NOT shower/wash with your normal soap  after using and rinsing off the CHG Soap.  7. Pat yourself dry with a CLEAN TOWEL.  8. Wear CLEAN PAJAMAS to bed the night before surgery  9. Place CLEAN SHEETS on your bed the night before your surgery  10. DO NOT SLEEP WITH PETS.   Day of Surgery:  Take a shower with CHG soap. Wear Clean/Comfortable clothing the morning of surgery Do not apply any deodorants/lotions.   Remember to brush your teeth WITH YOUR REGULAR TOOTHPASTE.   Please read over the following fact sheets that you were given.

## 2020-11-25 ENCOUNTER — Other Ambulatory Visit: Payer: Self-pay

## 2020-11-25 ENCOUNTER — Encounter (HOSPITAL_COMMUNITY): Payer: Self-pay

## 2020-11-25 ENCOUNTER — Encounter (HOSPITAL_COMMUNITY)
Admission: RE | Admit: 2020-11-25 | Discharge: 2020-11-25 | Disposition: A | Discharge status: Home / Self Care | Payer: Medicare Other | Source: Ambulatory Visit | Attending: Surgery | Admitting: Surgery

## 2020-11-25 DIAGNOSIS — E039 Hypothyroidism, unspecified: Secondary | ICD-10-CM | POA: Diagnosis not present

## 2020-11-25 DIAGNOSIS — J45909 Unspecified asthma, uncomplicated: Secondary | ICD-10-CM | POA: Insufficient documentation

## 2020-11-25 DIAGNOSIS — I5032 Chronic diastolic (congestive) heart failure: Secondary | ICD-10-CM | POA: Insufficient documentation

## 2020-11-25 DIAGNOSIS — I2510 Atherosclerotic heart disease of native coronary artery without angina pectoris: Secondary | ICD-10-CM | POA: Insufficient documentation

## 2020-11-25 DIAGNOSIS — N6091 Unspecified benign mammary dysplasia of right breast: Secondary | ICD-10-CM | POA: Insufficient documentation

## 2020-11-25 DIAGNOSIS — I130 Hypertensive heart and chronic kidney disease with heart failure and stage 1 through stage 4 chronic kidney disease, or unspecified chronic kidney disease: Secondary | ICD-10-CM | POA: Insufficient documentation

## 2020-11-25 DIAGNOSIS — Z86718 Personal history of other venous thrombosis and embolism: Secondary | ICD-10-CM | POA: Insufficient documentation

## 2020-11-25 DIAGNOSIS — R42 Dizziness and giddiness: Secondary | ICD-10-CM | POA: Insufficient documentation

## 2020-11-25 DIAGNOSIS — E1139 Type 2 diabetes mellitus with other diabetic ophthalmic complication: Secondary | ICD-10-CM | POA: Insufficient documentation

## 2020-11-25 DIAGNOSIS — N189 Chronic kidney disease, unspecified: Secondary | ICD-10-CM | POA: Diagnosis not present

## 2020-11-25 DIAGNOSIS — Z79899 Other long term (current) drug therapy: Secondary | ICD-10-CM | POA: Insufficient documentation

## 2020-11-25 DIAGNOSIS — Z01812 Encounter for preprocedural laboratory examination: Secondary | ICD-10-CM | POA: Insufficient documentation

## 2020-11-25 DIAGNOSIS — Z7901 Long term (current) use of anticoagulants: Secondary | ICD-10-CM | POA: Diagnosis not present

## 2020-11-25 DIAGNOSIS — K219 Gastro-esophageal reflux disease without esophagitis: Secondary | ICD-10-CM | POA: Insufficient documentation

## 2020-11-25 DIAGNOSIS — Z7984 Long term (current) use of oral hypoglycemic drugs: Secondary | ICD-10-CM | POA: Diagnosis not present

## 2020-11-25 DIAGNOSIS — H409 Unspecified glaucoma: Secondary | ICD-10-CM | POA: Diagnosis not present

## 2020-11-25 DIAGNOSIS — E785 Hyperlipidemia, unspecified: Secondary | ICD-10-CM | POA: Diagnosis not present

## 2020-11-25 DIAGNOSIS — E1122 Type 2 diabetes mellitus with diabetic chronic kidney disease: Secondary | ICD-10-CM | POA: Insufficient documentation

## 2020-11-25 HISTORY — DX: Personal history of urinary calculi: Z87.442

## 2020-11-25 HISTORY — DX: Pneumonia, unspecified organism: J18.9

## 2020-11-25 HISTORY — DX: Cardiac murmur, unspecified: R01.1

## 2020-11-25 HISTORY — DX: Personal history of other diseases of the digestive system: Z87.19

## 2020-11-25 LAB — BASIC METABOLIC PANEL
Anion gap: 8 (ref 5–15)
BUN: 19 mg/dL (ref 8–23)
CO2: 27 mmol/L (ref 22–32)
Calcium: 9.4 mg/dL (ref 8.9–10.3)
Chloride: 106 mmol/L (ref 98–111)
Creatinine, Ser: 1.15 mg/dL — ABNORMAL HIGH (ref 0.44–1.00)
GFR, Estimated: 48 mL/min — ABNORMAL LOW (ref >60)
Glucose, Bld: 188 mg/dL — ABNORMAL HIGH (ref 70–99)
Potassium: 3.9 mmol/L (ref 3.5–5.1)
Sodium: 141 mmol/L (ref 135–145)

## 2020-11-25 LAB — CBC
HCT: 43.4 % (ref 36.0–46.0)
Hemoglobin: 14 g/dL (ref 12.0–15.0)
MCH: 29.8 pg (ref 26.0–34.0)
MCHC: 32.3 g/dL (ref 30.0–36.0)
MCV: 92.3 fL (ref 80.0–100.0)
Platelets: 247 10*3/uL (ref 150–400)
RBC: 4.7 MIL/uL (ref 3.87–5.11)
RDW: 12.9 % (ref 11.5–15.5)
WBC: 6.8 10*3/uL (ref 4.0–10.5)
nRBC: 0 % (ref 0.0–0.2)

## 2020-11-25 LAB — GLUCOSE, CAPILLARY: Glucose-Capillary: 192 mg/dL — ABNORMAL HIGH (ref 70–99)

## 2020-11-25 NOTE — Progress Notes (Signed)
PCP - Dr. Sharilyn Sites Cardiologist - Dr. Oval Linsey with [facility]  Chest x-ray - Not indicated EKG - 04/11/20 Stress Test - 10/09/19 ECHO - 04/25/15 Cardiac Cath - 05/26/15  Sleep Study - Denies  DM - Type II Fasting Blood Sugar - 90-130 CBG at PAT appt 192 Checks Blood Sugar every other day  Blood Thinner Instructions: Hold Coumadin 5 days prior  ERAS Protcol -Yes  COVID TEST- No, ambulatory surgery   Anesthesia review: Yes cardiac history  Patient denies shortness of breath, fever, cough and chest pain at PAT appointment   All instructions explained to the patient, with a verbal understanding of the material. Patient agrees to go over the instructions while at home for a better understanding.  The opportunity to ask questions was provided.

## 2020-11-26 ENCOUNTER — Ambulatory Visit (INDEPENDENT_AMBULATORY_CARE_PROVIDER_SITE_OTHER): Payer: Medicare Other | Admitting: *Deleted

## 2020-11-26 DIAGNOSIS — Z5181 Encounter for therapeutic drug level monitoring: Secondary | ICD-10-CM | POA: Diagnosis not present

## 2020-11-26 DIAGNOSIS — I82A12 Acute embolism and thrombosis of left axillary vein: Secondary | ICD-10-CM | POA: Diagnosis not present

## 2020-11-26 LAB — POCT INR: INR: 1.9 — AB (ref 2.0–3.0)

## 2020-11-26 LAB — HEMOGLOBIN A1C
Hgb A1c MFr Bld: 7.5 % — ABNORMAL HIGH (ref 4.8–5.6)
Mean Plasma Glucose: 169 mg/dL

## 2020-11-26 MED ORDER — ENOXAPARIN SODIUM 120 MG/0.8ML IJ SOSY: 120.0000 mg | PREFILLED_SYRINGE | INTRAMUSCULAR | 1 refills | Status: DC

## 2020-11-26 NOTE — Patient Instructions (Addendum)
Procedure:  Rt Breast lumpectomy 12/03/20 Labs: SCr 1.15  CrCl 49.71  Hgb 14  Hct 43.4  Plts 247  Wt 80.7kg  5/26  Take last dose of warfarin 5/27  No lovenox or warfarin 5/28 - 5/31  Lovenox 120mg  sq every morning at 7am 6/1  No Lovenox -----------procedure--------warfarin 3mg  pm 6/2 Lovenox 120mg  sq am and warfarin 3mg  pm 6/3 - 6/6  Lovenox 120mg  sq am and warfarin 2mg  pm 6/7  Lovenox 120mg  sq am --------INR appt at 10:45am

## 2020-11-26 NOTE — Anesthesia Preprocedure Evaluation (Addendum)
Anesthesia Evaluation  Patient identified by MRN, date of birth, ID band Patient awake    Reviewed: Allergy & Precautions, NPO status , Patient's Chart, lab work & pertinent test results, reviewed documented beta blocker date and time   Airway Mallampati: III  TM Distance: >3 FB Neck ROM: Full    Dental no notable dental hx. (+) Teeth Intact, Dental Advisory Given   Pulmonary asthma , former smoker,  Quit smoking 2015 Last albuterol use 3d ago, 3x/wk    Pulmonary exam normal breath sounds clear to auscultation       Cardiovascular hypertension, Pt. on medications and Pt. on home beta blockers +CHF (grade 2 diastolic dysfunction) and + DVT (2014 LLE, lovenox/coumadin bridge for this procedure, has been off coumadin for 5d; LD lovenox yesterday )  Normal cardiovascular exam+ Valvular Problems/Murmurs (mild MR) MR  Rhythm:Regular Rate:Normal  Nuclear stress test 10/09/19:  Nuclear stress EF: 74%. The left ventricular ejection fraction is hyperdynamic (>65%).  There was no ST segment deviation noted during stress.  This is a low risk study. There is no evidence of ischemia or previous infarction.  The study is normal.   Cardiac cath 05/26/15:  Mid LAD lesion, 50% stenosed. FFR of this lesion showed value of 0.85. This is felt to be nonsignificant.  Normal LVEDP. Continue aggressive medical therapy. Would consider adding long-acting nitrate as the patient may have some component of vasospasm.   Cardiac cath 04/25/15: Study Conclusions  - Left ventricle: The cavity size was normal. Systolic function was  normal. The estimated ejection fraction was in the range of 55%  to 60%. Wall motion was normal; there were no regional wall  motion abnormalities. Features are consistent with a pseudonormal  left ventricular filling pattern, with concomitant abnormal  relaxation and increased filling pressure (grade 2 diastolic   dysfunction).  - Mitral valve: Calcified annulus. There was mild regurgitation  directed posteriorly.     Neuro/Psych negative psych ROS   GI/Hepatic Neg liver ROS, hiatal hernia, GERD  Medicated and Controlled,sigmoid diverticulitis (with perforation, s/p Hartmann's procedure 07/18/06; colostomy reversal 11/21/06),    Endo/Other  diabetes, Well Controlled, Type 2, Oral Hypoglycemic AgentsHypothyroidism a1c 7.5  Renal/GU Renal InsufficiencyRenal diseaseCr 1.15  negative genitourinary   Musculoskeletal negative musculoskeletal ROS (+)   Abdominal   Peds  Hematology negative hematology ROS (+) hct 43   Anesthesia Other Findings Right breast atypical ductal hyperplasia   Reproductive/Obstetrics negative OB ROS                           Anesthesia Physical Anesthesia Plan  ASA: III  Anesthesia Plan: General   Post-op Pain Management:    Induction: Intravenous  PONV Risk Score and Plan: 3 and Ondansetron, Dexamethasone and Treatment may vary due to age or medical condition  Airway Management Planned: LMA  Additional Equipment: None  Intra-op Plan:   Post-operative Plan: Extubation in OR  Informed Consent: I have reviewed the patients History and Physical, chart, labs and discussed the procedure including the risks, benefits and alternatives for the proposed anesthesia with the patient or authorized representative who has indicated his/her understanding and acceptance.     Dental advisory given  Plan Discussed with: CRNA  Anesthesia Plan Comments:       Anesthesia Quick Evaluation

## 2020-11-26 NOTE — Progress Notes (Signed)
Anesthesia Chart Review:  Case: [id number] Date/Time: 12/03/20 1100   Procedure: RIGHT BREAST LUMPECTOMY WITH RADIOACTIVE SEED LOCALIZATION (Right Breast)   Anesthesia type: General   Pre-op diagnosis: RIGHT BREAST ATYPICAL DUCTAL HYPERPLASIA   Location: [place] OR ROOM 09 / [name]   Surgeons: Donnie Mesa, MD     [place] 12/03/19 at 2:30 PM  DISCUSSION: Patient is an 80 year old female scheduled for the above procedure.  History includes former smoker (quit 12/10/13), HTN, hypothyroidism, DM2, DVT (left popliteal vein, lesser saphenous vein 07/24/12), CAD (50% LAD, possible component of vasospasm 05/2015; normal stress test 10/2019), chronic diastolic CHF, HLD, GERD, hiatal hernia, CKD, asthmatic bronchitis, glaucoma, chronic vertigo, sigmoid diverticulitis (with perforation, s/p Hartmann's procedure 07/18/06; colostomy reversal 11/21/06), neck surgery (C4-6 ACDF 06/20/00), back surgery. Her husband died of COVID pneumonia in 2019/08/04.     Preoperative cardiology input outlined by Laurann Montana, NP on 11/13/20: "Given past medical history and time since last visit, based on ACC/AHA guidelines, DENYCE HARR would be at acceptable risk for the planned procedure without further cardiovascular testing.   As she has had DVTx2 WILL require Lovenox bridge and this will be arranged with our [facility]in [facility]."  Instructions are outlined as: "5/26  Take last dose of warfarin 5/27  No lovenox or warfarin 5/28 - 5/31  Lovenox 120mg  sq every morning at 7am 6/1  No Lovenox -----------procedure--------warfarin 3mg  pm 6/2 Lovenox 120mg  sq am and warfarin 3mg  pm 6/3 - 6/6  Lovenox 120mg  sq am and warfarin 2mg  pm 6/7  Lovenox 120mg  sq am --------INR appt at 10:45am"  Last INR 11/26/20 was 1.9. Will order STAT PT/INR for the morning of surgery. Anesthesia team to evaluate on the day of surgery.   VS: BP 131/68   Pulse 64   Temp 36.4 C (Oral)   Resp 17   Ht 5\' 2"  (1.575 m)   Wt 66.5 kg   SpO2 100%   BMI 26.83  kg/m     PROVIDERS: Sharilyn Sites, MD is PCP  - Skeet Latch, MD is cardiologist. Last visit 06/24/20. She had negative stress test in 10/2019 for evaluation of chest pain following the death of her spouse from [place]. Chest pain had resolved. Statin previously held due to transaminitis. hydralazine resumed for HTN. Continue Lasix, Coreg, losartan. Continue warfarin for DVT history. Six month follow-up planned.  Bernarda Caffey, MD is retinal specialist   LABS: Labs reviewed: Repeat  PT/INR on the day of surgery.  (all labs ordered are listed, but only abnormal results are displayed)  Labs Reviewed  GLUCOSE, CAPILLARY - Abnormal; Notable for the following components:      Result Value   Glucose-Capillary 192 (*)    All other components within normal limits  BASIC METABOLIC PANEL - Abnormal; Notable for the following components:   Glucose, Bld 188 (*)    Creatinine, Ser 1.15 (*)    GFR, Estimated 48 (*)    All other components within normal limits  HEMOGLOBIN A1C - Abnormal; Notable for the following components:   Hgb A1c MFr Bld 7.5 (*)    All other components within normal limits  CBC    EKG: 04/11/20 ([facility]): Normal sinus rhythm.  Right bundle branch block.  T wave abnormality, consider inferior ischemia.   CV: Nuclear stress test 10/09/19:  Nuclear stress EF: 74%. The left ventricular ejection fraction is hyperdynamic (>65%).  There was no ST segment deviation noted during stress.  This is a low risk study. There is no  evidence of ischemia or previous infarction.  The study is normal.   Cardiac cath 05/26/15:  Mid LAD lesion, 50% stenosed. FFR of this lesion showed value of 0.85. This is felt to be nonsignificant.  Normal LVEDP. Continue aggressive medical therapy. Would consider adding long-acting nitrate as the patient may have some component of vasospasm.   Cardiac cath 04/25/15: Study Conclusions  - Left ventricle: The cavity size was normal.  Systolic function was  normal. The estimated ejection fraction was in the range of 55%  to 60%. Wall motion was normal; there were no regional wall  motion abnormalities. Features are consistent with a pseudonormal  left ventricular filling pattern, with concomitant abnormal  relaxation and increased filling pressure (grade 2 diastolic  dysfunction).  - Mitral valve: Calcified annulus. There was mild regurgitation  directed posteriorly.    Past Medical History:  Diagnosis Date  . Antral gastritis    EGD 11/15  . Asthmatic bronchitis   . Back pain   . Chronic diastolic heart failure (HCC) 05/20/2015   Grade 2 diastolic dysfunction.  04/2015.  [name] Chronic kidney disease    kidney function low  . Diabetes mellitus    x 5 yrs  . DVT of axillary vein, acute left ([place]) 07/24/12  . GERD (gastroesophageal reflux disease)   . Glaucoma    POAG OU  . Heart murmur   . History of hiatal hernia   . History of kidney stones   . Hyperlipidemia 05/20/2015  . Hypertension   . Hypertensive retinopathy    OU  . Hypothyroidism   . Kidney stones   . Macular degeneration    Wet OD, Dry OS  . Mixed hyperlipidemia   . Peripheral venous insufficiency   . Pinched nerve    right elbow  . Pneumonia   . Sigmoid diverticulitis   . Vertigo    chonic    Past Surgical History:  Procedure Laterality Date  . ABDOMINAL HYSTERECTOMY    . BACK SURGERY     spinal   . CARDIAC CATHETERIZATION N/A 05/26/2015   Procedure: Left Heart Cath and Coronary Angiography;  Surgeon: Jettie Booze, MD;  Location: [place] CV LAB;  Service: Cardiovascular;  Laterality: N/A;  . CATARACT EXTRACTION Bilateral   . CHOLECYSTECTOMY    . COLON SURGERY    . COLONOSCOPY N/A 12/27/2013   Procedure: COLONOSCOPY;  Surgeon: Rogene Houston, MD;  [facility];  Service: Endoscopy;  Laterality: N/A;  200  . COLOSTOMY CLOSURE    . ESOPHAGOGASTRODUODENOSCOPY N/A 05/07/2014   Procedure:  ESOPHAGOGASTRODUODENOSCOPY (EGD);  Surgeon: Rogene Houston, MD;  Location: [facility];  Service: Endoscopy;  Laterality: N/A;  . EYE SURGERY Bilateral    Cat Sx  . fracture left foot    . HERNIA REPAIR    . NM MYOCAR PERF WALL MOTION  01/28/2009   Normal  . OTHER SURGICAL HISTORY     colostomy, colostomy reversal, for diverticulitis surgical hernia repair, arm surgery, neck surgery  . US ECHOCARDIOGRAPHY  02/11/2010   Mild MR,trace TR & AI    MEDICATIONS: . albuterol (PROVENTIL HFA;VENTOLIN HFA) 108 (90 BASE) MCG/ACT inhaler  . Alirocumab (PRALUENT) 75 MG/ML SOAJ  . amitriptyline (ELAVIL) 25 MG tablet  . Black Cohosh 40 MG CAPS  . brimonidine (ALPHAGAN) 0.2 % ophthalmic solution  . carvedilol (COREG) 6.25 MG tablet  . cetirizine (ZYRTEC) 10 MG tablet  . Cholecalciferol (VITAMIN D-3) 1000 units CAPS  . diazepam (VALIUM) 2  MG tablet  . dorzolamide-timolol (COSOPT) 22.3-6.8 MG/ML ophthalmic solution  . enoxaparin (LOVENOX) 120 MG/0.8ML injection  . esomeprazole (NEXIUM) 20 MG capsule  . FARXIGA 5 MG TABS tablet  . furosemide (LASIX) 40 MG tablet  . gabapentin (NEURONTIN) 300 MG capsule  . glimepiride (AMARYL) 2 MG tablet  . hydrALAZINE (APRESOLINE) 25 MG tablet  . isosorbide mononitrate (IMDUR) 30 MG 24 hr tablet  . latanoprost (XALATAN) 0.005 % ophthalmic solution  . meclizine (ANTIVERT) 25 MG tablet  . Multiple Vitamins-Minerals (PRESERVISION AREDS 2 PO)  . Omega-3 Fatty Acids (FISH OIL) 1200 MG CAPS  . polyethylene glycol (MIRALAX / GLYCOLAX) packet  . potassium chloride SA (KLOR-CON) 20 MEQ tablet  . tetrahydrozoline 0.05 % ophthalmic solution  . TRADJENTA 5 MG TABS tablet  . vitamin B-12 (CYANOCOBALAMIN) 100 MCG tablet  . warfarin (COUMADIN) 2 MG tablet   No current facility-administered medications for this encounter.    Myra Gianotti, PA-C Surgical Short Stay/Anesthesiology [facility] Phone [phone] [facility] Phone [phone] 11/26/2020 6:44 PM

## 2020-12-02 ENCOUNTER — Encounter (INDEPENDENT_AMBULATORY_CARE_PROVIDER_SITE_OTHER): Payer: Medicare Other | Admitting: Ophthalmology

## 2020-12-02 ENCOUNTER — Other Ambulatory Visit: Payer: Self-pay

## 2020-12-02 ENCOUNTER — Ambulatory Visit
Admission: RE | Admit: 2020-12-02 | Discharge: 2020-12-02 | Disposition: A | Discharge status: Home / Self Care | Payer: Medicare Other | Source: Ambulatory Visit | Attending: Surgery | Admitting: Surgery

## 2020-12-02 DIAGNOSIS — E1165 Type 2 diabetes mellitus with hyperglycemia: Secondary | ICD-10-CM | POA: Diagnosis not present

## 2020-12-02 DIAGNOSIS — N6091 Unspecified benign mammary dysplasia of right breast: Secondary | ICD-10-CM

## 2020-12-02 DIAGNOSIS — R928 Other abnormal and inconclusive findings on diagnostic imaging of breast: Secondary | ICD-10-CM | POA: Diagnosis not present

## 2020-12-02 DIAGNOSIS — I2510 Atherosclerotic heart disease of native coronary artery without angina pectoris: Secondary | ICD-10-CM | POA: Diagnosis not present

## 2020-12-02 DIAGNOSIS — I10 Essential (primary) hypertension: Secondary | ICD-10-CM | POA: Diagnosis not present

## 2020-12-03 ENCOUNTER — Ambulatory Visit (HOSPITAL_COMMUNITY): Payer: Medicare Other | Admitting: Vascular Surgery

## 2020-12-03 ENCOUNTER — Encounter (HOSPITAL_COMMUNITY): Payer: Self-pay | Admitting: Surgery

## 2020-12-03 ENCOUNTER — Ambulatory Visit
Admission: RE | Admit: 2020-12-03 | Discharge: 2020-12-03 | Disposition: A | Discharge status: Home / Self Care | Payer: Medicare Other | Source: Ambulatory Visit | Attending: Surgery | Admitting: Surgery

## 2020-12-03 ENCOUNTER — Ambulatory Visit (HOSPITAL_COMMUNITY)
Admission: RE | Admit: 2020-12-03 | Discharge: 2020-12-03 | Disposition: A | Discharge status: Home / Self Care | Payer: Medicare Other | Attending: Surgery | Admitting: Surgery

## 2020-12-03 ENCOUNTER — Encounter (HOSPITAL_COMMUNITY)
Admission: RE | Disposition: A | Discharge status: Home / Self Care | Payer: Self-pay | Source: Home / Self Care | Attending: Surgery

## 2020-12-03 DIAGNOSIS — Z86718 Personal history of other venous thrombosis and embolism: Secondary | ICD-10-CM | POA: Diagnosis not present

## 2020-12-03 DIAGNOSIS — D0511 Intraductal carcinoma in situ of right breast: Secondary | ICD-10-CM | POA: Insufficient documentation

## 2020-12-03 DIAGNOSIS — Z8041 Family history of malignant neoplasm of ovary: Secondary | ICD-10-CM | POA: Diagnosis not present

## 2020-12-03 DIAGNOSIS — Z7984 Long term (current) use of oral hypoglycemic drugs: Secondary | ICD-10-CM | POA: Insufficient documentation

## 2020-12-03 DIAGNOSIS — I5032 Chronic diastolic (congestive) heart failure: Secondary | ICD-10-CM | POA: Diagnosis not present

## 2020-12-03 DIAGNOSIS — Z8049 Family history of malignant neoplasm of other genital organs: Secondary | ICD-10-CM | POA: Insufficient documentation

## 2020-12-03 DIAGNOSIS — N6091 Unspecified benign mammary dysplasia of right breast: Secondary | ICD-10-CM

## 2020-12-03 DIAGNOSIS — Z87891 Personal history of nicotine dependence: Secondary | ICD-10-CM | POA: Insufficient documentation

## 2020-12-03 DIAGNOSIS — I110 Hypertensive heart disease with heart failure: Secondary | ICD-10-CM | POA: Insufficient documentation

## 2020-12-03 DIAGNOSIS — Z803 Family history of malignant neoplasm of breast: Secondary | ICD-10-CM | POA: Insufficient documentation

## 2020-12-03 DIAGNOSIS — E1122 Type 2 diabetes mellitus with diabetic chronic kidney disease: Secondary | ICD-10-CM | POA: Diagnosis not present

## 2020-12-03 DIAGNOSIS — Z79899 Other long term (current) drug therapy: Secondary | ICD-10-CM | POA: Diagnosis not present

## 2020-12-03 DIAGNOSIS — I509 Heart failure, unspecified: Secondary | ICD-10-CM | POA: Diagnosis not present

## 2020-12-03 DIAGNOSIS — Z7901 Long term (current) use of anticoagulants: Secondary | ICD-10-CM | POA: Diagnosis not present

## 2020-12-03 DIAGNOSIS — N6489 Other specified disorders of breast: Secondary | ICD-10-CM | POA: Diagnosis not present

## 2020-12-03 DIAGNOSIS — Z8249 Family history of ischemic heart disease and other diseases of the circulatory system: Secondary | ICD-10-CM | POA: Diagnosis not present

## 2020-12-03 DIAGNOSIS — E119 Type 2 diabetes mellitus without complications: Secondary | ICD-10-CM | POA: Insufficient documentation

## 2020-12-03 DIAGNOSIS — I130 Hypertensive heart and chronic kidney disease with heart failure and stage 1 through stage 4 chronic kidney disease, or unspecified chronic kidney disease: Secondary | ICD-10-CM | POA: Diagnosis not present

## 2020-12-03 DIAGNOSIS — R928 Other abnormal and inconclusive findings on diagnostic imaging of breast: Secondary | ICD-10-CM | POA: Diagnosis not present

## 2020-12-03 DIAGNOSIS — N189 Chronic kidney disease, unspecified: Secondary | ICD-10-CM | POA: Diagnosis not present

## 2020-12-03 HISTORY — PX: BREAST LUMPECTOMY WITH RADIOACTIVE SEED LOCALIZATION: SHX6424

## 2020-12-03 LAB — GLUCOSE, CAPILLARY
Glucose-Capillary: 152 mg/dL — ABNORMAL HIGH (ref 70–99)
Glucose-Capillary: 151 mg/dL — ABNORMAL HIGH (ref 70–99)

## 2020-12-03 LAB — PROTIME-INR
INR: 0.9 (ref 0.8–1.2)
Prothrombin Time: 12.4 seconds (ref 11.4–15.2)

## 2020-12-03 SURGERY — BREAST LUMPECTOMY WITH RADIOACTIVE SEED LOCALIZATION
Anesthesia: General | Site: Breast | Laterality: Right

## 2020-12-03 MED ORDER — 0.9 % SODIUM CHLORIDE (POUR BTL) OPTIME
TOPICAL | Status: DC | PRN
  Administered 2020-12-03: 1000 mL

## 2020-12-03 MED ORDER — PROPOFOL 10 MG/ML IV BOLUS
INTRAVENOUS | Status: DC | PRN
  Administered 2020-12-03: 100 mg via INTRAVENOUS

## 2020-12-03 MED ORDER — LACTATED RINGERS IV SOLN: INTRAVENOUS | Status: DC

## 2020-12-03 MED ORDER — FENTANYL CITRATE (PF) 100 MCG/2ML IJ SOLN: 25.0000 ug | INTRAMUSCULAR | Status: DC | PRN

## 2020-12-03 MED ORDER — FENTANYL CITRATE (PF) 100 MCG/2ML IJ SOLN
INTRAMUSCULAR | Status: DC | PRN
  Administered 2020-12-03: 25 ug via INTRAVENOUS

## 2020-12-03 MED ORDER — ONDANSETRON HCL 4 MG/2ML IJ SOLN
INTRAMUSCULAR | Status: AC
  Filled 2020-12-03: qty 2

## 2020-12-03 MED ORDER — ONDANSETRON HCL 4 MG/2ML IJ SOLN
INTRAMUSCULAR | Status: DC | PRN
  Administered 2020-12-03: 4 mg via INTRAVENOUS

## 2020-12-03 MED ORDER — ORAL CARE MOUTH RINSE: 15.0000 mL | Freq: Once | OROMUCOSAL | Status: AC

## 2020-12-03 MED ORDER — CHLORHEXIDINE GLUCONATE CLOTH 2 % EX PADS: 6.0000 | MEDICATED_PAD | Freq: Once | CUTANEOUS | Status: DC

## 2020-12-03 MED ORDER — CEFAZOLIN SODIUM-DEXTROSE 2-4 GM/100ML-% IV SOLN
2.0000 g | INTRAVENOUS | Status: AC
  Administered 2020-12-03: 2 g via INTRAVENOUS

## 2020-12-03 MED ORDER — ACETAMINOPHEN 500 MG PO TABS
1000.0000 mg | ORAL_TABLET | ORAL | Status: AC
  Administered 2020-12-03: 1000 mg via ORAL

## 2020-12-03 MED ORDER — CHLORHEXIDINE GLUCONATE CLOTH 2 % EX PADS
6.0000 | MEDICATED_PAD | Freq: Once | CUTANEOUS | Status: AC
  Administered 2020-12-03: 6 via TOPICAL

## 2020-12-03 MED ORDER — BUPIVACAINE HCL (PF) 0.25 % IJ SOLN
INTRAMUSCULAR | Status: AC
  Filled 2020-12-03: qty 30

## 2020-12-03 MED ORDER — DEXAMETHASONE SODIUM PHOSPHATE 10 MG/ML IJ SOLN
INTRAMUSCULAR | Status: AC
  Filled 2020-12-03: qty 1

## 2020-12-03 MED ORDER — OXYCODONE HCL 5 MG PO TABS
2.5000 mg | ORAL_TABLET | Freq: Once | ORAL | Status: AC
Start: 2020-12-03 — End: 2020-12-03
  Administered 2020-12-03: 2.5 mg via ORAL

## 2020-12-03 MED ORDER — OXYCODONE HCL 5 MG PO TABS
ORAL_TABLET | ORAL | Status: AC
  Filled 2020-12-03: qty 1

## 2020-12-03 MED ORDER — BUPIVACAINE HCL (PF) 0.25 % IJ SOLN
INTRAMUSCULAR | Status: DC | PRN
  Administered 2020-12-03: 10 mL

## 2020-12-03 MED ORDER — FENTANYL CITRATE (PF) 250 MCG/5ML IJ SOLN
INTRAMUSCULAR | Status: AC
  Filled 2020-12-03: qty 5

## 2020-12-03 MED ORDER — LIDOCAINE HCL (CARDIAC) PF 100 MG/5ML IV SOSY
PREFILLED_SYRINGE | INTRAVENOUS | Status: DC | PRN
  Administered 2020-12-03: 50 mg via INTRAVENOUS

## 2020-12-03 MED ORDER — LIDOCAINE 2% (20 MG/ML) 5 ML SYRINGE
INTRAMUSCULAR | Status: AC
  Filled 2020-12-03: qty 5

## 2020-12-03 MED ORDER — EPHEDRINE 5 MG/ML INJ
INTRAVENOUS | Status: AC
  Filled 2020-12-03: qty 10

## 2020-12-03 MED ORDER — EPHEDRINE SULFATE-NACL 50-0.9 MG/10ML-% IV SOSY
PREFILLED_SYRINGE | INTRAVENOUS | Status: DC | PRN
  Administered 2020-12-03: 5 mg via INTRAVENOUS
  Administered 2020-12-03: 15 mg via INTRAVENOUS

## 2020-12-03 MED ORDER — DEXAMETHASONE SODIUM PHOSPHATE 4 MG/ML IJ SOLN
INTRAMUSCULAR | Status: DC | PRN
  Administered 2020-12-03: 10 mg via INTRAVENOUS

## 2020-12-03 MED ORDER — OXYCODONE HCL 5 MG PO TABS: 5.0000 mg | ORAL_TABLET | Freq: Once | ORAL | Status: DC | PRN

## 2020-12-03 MED ORDER — ONDANSETRON HCL 4 MG/2ML IJ SOLN
4.0000 mg | Freq: Once | INTRAMUSCULAR | Status: AC | PRN
  Administered 2020-12-03: 4 mg via INTRAVENOUS

## 2020-12-03 MED ORDER — CHLORHEXIDINE GLUCONATE 0.12 % MT SOLN
15.0000 mL | Freq: Once | OROMUCOSAL | Status: AC
  Administered 2020-12-03: 15 mL via OROMUCOSAL

## 2020-12-03 MED ORDER — OXYCODONE HCL 5 MG/5ML PO SOLN: 5.0000 mg | Freq: Once | ORAL | Status: DC | PRN

## 2020-12-03 MED ORDER — PHENYLEPHRINE 40 MCG/ML (10ML) SYRINGE FOR IV PUSH (FOR BLOOD PRESSURE SUPPORT)
PREFILLED_SYRINGE | INTRAVENOUS | Status: DC | PRN
  Administered 2020-12-03: 120 ug via INTRAVENOUS
  Administered 2020-12-03: 200 ug via INTRAVENOUS

## 2020-12-03 SURGICAL SUPPLY — 43 items
APL PRP STRL LF DISP 70% ISPRP (MISCELLANEOUS) ×1
APL SKNCLS STERI-STRIP NONHPOA (GAUZE/BANDAGES/DRESSINGS) ×1
APPLIER CLIP 9.375 MED OPEN (MISCELLANEOUS)
APR CLP MED 9.3 20 MLT OPN (MISCELLANEOUS)
BENZOIN TINCTURE PRP APPL 2/3 (GAUZE/BANDAGES/DRESSINGS) ×2 IMPLANT
BINDER BREAST LRG (GAUZE/BANDAGES/DRESSINGS) ×1 IMPLANT
BINDER BREAST XLRG (GAUZE/BANDAGES/DRESSINGS) IMPLANT
CANISTER SUCT 3000ML PPV (MISCELLANEOUS) ×2 IMPLANT
CHLORAPREP W/TINT 26 (MISCELLANEOUS) ×2 IMPLANT
CLIP APPLIE 9.375 MED OPEN (MISCELLANEOUS) IMPLANT
COVER PROBE W GEL 5X96 (DRAPES) ×2 IMPLANT
COVER SURGICAL LIGHT HANDLE (MISCELLANEOUS) ×2 IMPLANT
COVER WAND RF STERILE (DRAPES) ×2 IMPLANT
DEVICE DUBIN SPECIMEN MAMMOGRA (MISCELLANEOUS) ×2 IMPLANT
DRAPE CHEST BREAST 15X10 FENES (DRAPES) ×2 IMPLANT
DRSG TEGADERM 4X4.75 (GAUZE/BANDAGES/DRESSINGS) ×2 IMPLANT
ELECT CAUTERY BLADE 6.4 (BLADE) ×2 IMPLANT
ELECT REM PT RETURN 9FT ADLT (ELECTROSURGICAL) ×2
ELECTRODE REM PT RTRN 9FT ADLT (ELECTROSURGICAL) ×1 IMPLANT
GAUZE SPONGE 2X2 8PLY STRL LF (GAUZE/BANDAGES/DRESSINGS) ×1 IMPLANT
GLOVE BIO SURGEON STRL SZ7 (GLOVE) ×2 IMPLANT
GLOVE BIOGEL PI IND STRL 7.5 (GLOVE) ×1 IMPLANT
GLOVE BIOGEL PI INDICATOR 7.5 (GLOVE) ×1
GOWN STRL REUS W/ TWL LRG LVL3 (GOWN DISPOSABLE) ×2 IMPLANT
GOWN STRL REUS W/TWL LRG LVL3 (GOWN DISPOSABLE) ×4
ILLUMINATOR WAVEGUIDE N/F (MISCELLANEOUS) IMPLANT
KIT BASIN OR (CUSTOM PROCEDURE TRAY) ×2 IMPLANT
KIT MARKER MARGIN INK (KITS) ×2 IMPLANT
LIGHT WAVEGUIDE WIDE FLAT (MISCELLANEOUS) IMPLANT
NDL HYPO 25GX1X1/2 BEV (NEEDLE) ×1 IMPLANT
NEEDLE HYPO 25GX1X1/2 BEV (NEEDLE) ×2 IMPLANT
NS IRRIG 1000ML POUR BTL (IV SOLUTION) ×2 IMPLANT
PACK GENERAL/GYN (CUSTOM PROCEDURE TRAY) ×2 IMPLANT
SPONGE GAUZE 2X2 STER 10/PKG (GAUZE/BANDAGES/DRESSINGS) ×1
SPONGE LAP 4X18 RFD (DISPOSABLE) ×2 IMPLANT
STRIP CLOSURE SKIN 1/2X4 (GAUZE/BANDAGES/DRESSINGS) ×2 IMPLANT
SUT MNCRL AB 4-0 PS2 18 (SUTURE) ×2 IMPLANT
SUT SILK 2 0 SH (SUTURE) IMPLANT
SUT VIC AB 3-0 SH 27 (SUTURE) ×2
SUT VIC AB 3-0 SH 27X BRD (SUTURE) ×1 IMPLANT
SYR CONTROL 10ML LL (SYRINGE) ×2 IMPLANT
TOWEL GREEN STERILE (TOWEL DISPOSABLE) ×2 IMPLANT
TOWEL GREEN STERILE FF (TOWEL DISPOSABLE) ×2 IMPLANT

## 2020-12-03 NOTE — Op Note (Signed)
Pre-op Diagnosis:  Right breast mass Post-op Diagnosis: same Procedure:  Right radioactive seed localized lumpectomy Surgeon:  Lindzy Rupert K. Anesthesia:  GEN - LMA Indications:   An 80 year old female with several medical issues including congestive heart failure, diabetes, hypertension, anticoagulated on Coumadin for DVT. She presents after routine screening mammogram showed an area of calcifications in the right breast. Further workup showed a 2.8 x 1.6 cm group of linear and fine pleomorphic calcifications in the right upper inner quadrant. She has a 7 mm cyst in her central right breast. She underwent biopsy of the center of this area of calcifications. This revealed focal atypical ductal hyperplasia and vascular calcifications. Due to the wide area of calcifications and the risk for sampling error, she is referred for discussion of lumpectomy. The patient has a family history of breast cancer in a sister and also in her maternal grandmother. Description of procedure: The patient is brought to the operating room placed in supine position on the operating room table. After an adequate level of general anesthesia was obtained, her right breast was prepped with ChloraPrep and draped in sterile fashion. A timeout was taken to ensure the proper patient and proper procedure. We interrogated the breast with the neoprobe. We made a transverse incision in the upper central right breast after infiltrating with 0.25% Marcaine. Dissection was carried down in the breast tissue with cautery. We used the neoprobe to guide [place] towards the radioactive seed. We excised an area of tissue around the radioactive seed 3 cm in diameter. The specimen was removed and was oriented with a paint kit. Specimen mammogram showed the radioactive seed as well as the biopsy clip within the specimen.  The microcalcifications are also seen within the specimen. This was sent for pathologic examination. There is no residual  radioactivity within the biopsy cavity. We inspected carefully for hemostasis. The wound was thoroughly irrigated. The wound was closed with a deep layer of 3-0 Vicryl and a subcuticular layer of 4-0 Monocryl. Benzoin Steri-Strips were applied. The patient was then extubated and brought to the recovery room in stable condition. All sponge, instrument, and needle counts are correct.  Imogene Burn. Georgette Dover, MD, [facility] Surgery  General/ Trauma Surgery  12/03/2020 12:14 PM

## 2020-12-03 NOTE — Transfer of Care (Signed)
Immediate Anesthesia Transfer of Care Note  Patient: [name]  Procedure(s) Performed: RIGHT BREAST LUMPECTOMY WITH RADIOACTIVE SEED LOCALIZATION (Right Breast)  Patient Location: PACU  Anesthesia Type:General  Level of Consciousness: awake  Airway & Oxygen Therapy: Patient Spontanous Breathing  Post-op Assessment: Report given to RN and Post -op Vital signs reviewed and stable  Post vital signs: Reviewed and stable  Last Vitals:  Vitals Value Taken Time  BP    Temp    Pulse    Resp    SpO2      Last Pain:  Vitals:   12/03/20 1023  PainSc: 0-No pain         Complications: No complications documented.

## 2020-12-03 NOTE — Interval H&P Note (Signed)
History and Physical Interval Note:  12/03/2020 9:38 AM  [name]  has presented today for surgery, with the diagnosis of RIGHT BREAST ATYPICAL DUCTAL HYPERPLASIA.  The various methods of treatment have been discussed with the patient and family. After consideration of risks, benefits and other options for treatment, the patient has consented to  Procedure(s): RIGHT BREAST LUMPECTOMY WITH RADIOACTIVE SEED LOCALIZATION (Right) as a surgical intervention.  The patient's history has been reviewed, patient examined, no change in status, stable for surgery.  I have reviewed the patient's chart and labs.  Questions were answered to the patient's satisfaction.     Maia Petties

## 2020-12-03 NOTE — Anesthesia Procedure Notes (Signed)
Procedure Name: LMA Insertion Date/Time: 12/03/2020 11:50 AM Performed by: Hewitt Blade, CRNA Pre-anesthesia Checklist: Patient identified, Emergency Drugs available, Suction available and Patient being monitored Patient Re-evaluated:Patient Re-evaluated prior to induction Oxygen Delivery Method: Circle system utilized Preoxygenation: Pre-oxygenation with 100% oxygen Induction Type: IV induction Ventilation: Mask ventilation without difficulty LMA: LMA inserted LMA Size: 3.0 Number of attempts: 1 Placement Confirmation: positive ETCO2 and breath sounds checked- equal and bilateral Tube secured with: Tape Dental Injury: Teeth and Oropharynx as per pre-operative assessment

## 2020-12-03 NOTE — Discharge Instructions (Signed)
General Anesthesia, Adult, Care After This sheet gives you information about how to care for yourself after your procedure. Your health care provider may also give you more specific instructions. If you have problems or questions, contact your health care provider. What can I expect after the procedure? After the procedure, the following side effects are common:  Pain or discomfort at the IV site.  Nausea.  Vomiting.  Sore throat.  Trouble concentrating.  Feeling cold or chills.  Feeling weak or tired.  Sleepiness and fatigue.  Soreness and body aches. These side effects can affect parts of the body that were not involved in surgery. Follow these instructions at home: For the time period you were told by your health care provider:  Rest.  Do not participate in activities where you could fall or become injured.  Do not drive or use machinery.  Do not drink alcohol.  Do not take sleeping pills or medicines that cause drowsiness.  Do not make important decisions or sign legal documents.  Do not take care of children on your own.   Eating and drinking  Follow any instructions from your health care provider about eating or drinking restrictions.  When you feel hungry, start by eating small amounts of foods that are soft and easy to digest (bland), such as toast. Gradually return to your regular diet.  Drink enough fluid to keep your urine pale yellow.  If you vomit, rehydrate by drinking water, juice, or clear broth. General instructions  If you have sleep apnea, surgery and certain medicines can increase your risk for breathing problems. Follow instructions from your health care provider about wearing your sleep device: ? Anytime you are sleeping, including during daytime naps. ? While taking prescription pain medicines, sleeping medicines, or medicines that make you drowsy.  Have a responsible adult stay with you for the time you are told. It is important to have  someone help care for you until you are awake and alert.  Return to your normal activities as told by your health care provider. Ask your health care provider what activities are safe for you.  Take over-the-counter and prescription medicines only as told by your health care provider.  If you smoke, do not smoke without supervision.  Keep all follow-up visits as told by your health care provider. This is important. Contact a health care provider if:  You have nausea or vomiting that does not get better with medicine.  You cannot eat or drink without vomiting.  You have pain that does not get better with medicine.  You are unable to pass urine.  You develop a skin rash.  You have a fever.  You have redness around your IV site that gets worse. Get help right away if:  You have difficulty breathing.  You have chest pain.  You have blood in your urine or stool, or you vomit blood. Summary  After the procedure, it is common to have a sore throat or nausea. It is also common to feel tired.  Have a responsible adult stay with you for the time you are told. It is important to have someone help care for you until you are awake and alert.  When you feel hungry, start by eating small amounts of foods that are soft and easy to digest (bland), such as toast. Gradually return to your regular diet.  Drink enough fluid to keep your urine pale yellow.  Return to your normal activities as told by your health care provider.   Ask your health care provider what activities are safe for you. This information is not intended to replace advice given to you by your health care provider. Make sure you discuss any questions you have with your health care provider. Document Revised: 03/06/2020 Document Reviewed: 10/04/2019 Elsevier Patient Education  2021 [place] Office Phone Number [phone]  BREAST BIOPSY/ PARTIAL MASTECTOMY: POST OP INSTRUCTIONS  Always  review your discharge instruction sheet given to you by the facility where your surgery was performed.  IF YOU HAVE DISABILITY OR FAMILY LEAVE FORMS, YOU MUST BRING THEM TO THE OFFICE FOR PROCESSING.  DO NOT GIVE THEM TO YOUR DOCTOR.  1. A prescription for pain medication may be given to you upon discharge.  Take your pain medication as prescribed, if needed.  If narcotic pain medicine is not needed, then you may take acetaminophen (Tylenol) or ibuprofen (Advil) as needed. 2. Take your usually prescribed medications unless otherwise directed 3. If you need a refill on your pain medication, please contact your pharmacy.  They will contact our office to request authorization.  Prescriptions will not be filled after 5pm or on week-ends. 4. You should eat very light the first 24 hours after surgery, such as soup, crackers, pudding, etc.  Resume your normal diet the day after surgery. 5. Most patients will experience some swelling and bruising in the breast.  Ice packs and a good support bra will help.  Swelling and bruising can take several days to resolve.  6. It is common to experience some constipation if taking pain medication after surgery.  Increasing fluid intake and taking a stool softener will usually help or prevent this problem from occurring.  A mild laxative (Milk of Magnesia or Miralax) should be taken according to package directions if there are no bowel movements after 48 hours. 7. Unless discharge instructions indicate otherwise, you may remove your bandages 24-48 hours after surgery, and you may shower at that time.  You may have steri-strips (small skin tapes) in place directly over the incision.  These strips should be left on the skin for 7-10 days.  If your surgeon used skin glue on the incision, you may shower in 24 hours.  The glue will flake off over the next 2-3 weeks.  Any sutures or staples will be removed at the office during your follow-up visit. 8. ACTIVITIES:  You may resume  regular daily activities (gradually increasing) beginning the next day.  Wearing a good support bra or sports bra minimizes pain and swelling.  You may have sexual intercourse when it is comfortable. a. You may drive when you no longer are taking prescription pain medication, you can comfortably wear a seatbelt, and you can safely maneuver your car and apply brakes. b. RETURN TO WORK:  ______________________________________________________________________________________ 9. You should see your doctor in the office for a follow-up appointment approximately two weeks after your surgery.  Your doctor's nurse will typically make your follow-up appointment when she calls you with your pathology report.  Expect your pathology report 2-3 business days after your surgery.  You may call to check if you do not hear from [place] after three days. 10. OTHER INSTRUCTIONS: _______________________________________________________________________________________________ _____________________________________________________________________________________________________________________________________ _____________________________________________________________________________________________________________________________________ _____________________________________________________________________________________________________________________________________  WHEN TO CALL YOUR DOCTOR: 1. Fever over 101.0 2. Nausea and/or vomiting. 3. Extreme swelling or bruising. 4. Continued bleeding from incision. 5. Increased pain, redness, or drainage from the incision.  The clinic staff is available to answer your questions during regular business hours.  Please don't  hesitate to call and ask to speak to one of the nurses for clinical concerns.  If you have a medical emergency, go to the nearest emergency room or call 911.  A surgeon from [facility] is always on call at the hospital.  For further questions,  please visit [email or web address]

## 2020-12-03 NOTE — Anesthesia Postprocedure Evaluation (Signed)
Anesthesia Post Note  Patient: [name]  Procedure(s) Performed: RIGHT BREAST LUMPECTOMY WITH RADIOACTIVE SEED LOCALIZATION (Right Breast)     Patient location during evaluation: PACU Anesthesia Type: General Level of consciousness: awake and alert, oriented and patient cooperative Pain management: pain level controlled Vital Signs Assessment: post-procedure vital signs reviewed and stable Respiratory status: spontaneous breathing, nonlabored ventilation and respiratory function stable Cardiovascular status: blood pressure returned to baseline and stable Postop Assessment: no apparent nausea or vomiting Anesthetic complications: no   No complications documented.  Last Vitals:  Vitals:   12/03/20 1300 12/03/20 1305  BP:  (!) 102/54  Pulse:  71  Resp: 18 18  Temp:  (!) 36.3 C  SpO2:  95%    Last Pain:  Vitals:   12/03/20 1305  PainSc: 0-No pain                 Pervis Hocking

## 2020-12-04 ENCOUNTER — Encounter (HOSPITAL_COMMUNITY): Payer: Self-pay | Admitting: Surgery

## 2020-12-05 ENCOUNTER — Other Ambulatory Visit: Payer: Self-pay | Admitting: Cardiovascular Disease

## 2020-12-08 LAB — SURGICAL PATHOLOGY

## 2020-12-09 ENCOUNTER — Ambulatory Visit (INDEPENDENT_AMBULATORY_CARE_PROVIDER_SITE_OTHER): Payer: Medicare Other | Admitting: *Deleted

## 2020-12-09 DIAGNOSIS — I82A12 Acute embolism and thrombosis of left axillary vein: Secondary | ICD-10-CM | POA: Diagnosis not present

## 2020-12-09 DIAGNOSIS — Z5181 Encounter for therapeutic drug level monitoring: Secondary | ICD-10-CM | POA: Diagnosis not present

## 2020-12-09 LAB — POCT INR: INR: 1.2 — AB (ref 2.0–3.0)

## 2020-12-09 MED ORDER — ENOXAPARIN SODIUM 120 MG/0.8ML IJ SOSY: 120.0000 mg | PREFILLED_SYRINGE | INTRAMUSCULAR | 1 refills | Status: DC

## 2020-12-09 NOTE — Patient Instructions (Signed)
Take warfarin 2 1/2 tablets tonight and tomorrow night.   Continue Lovenox injections daily in am. Recheck in on Thursday

## 2020-12-11 ENCOUNTER — Ambulatory Visit (INDEPENDENT_AMBULATORY_CARE_PROVIDER_SITE_OTHER): Payer: Medicare Other | Admitting: *Deleted

## 2020-12-11 DIAGNOSIS — Z5181 Encounter for therapeutic drug level monitoring: Secondary | ICD-10-CM

## 2020-12-11 DIAGNOSIS — I82A12 Acute embolism and thrombosis of left axillary vein: Secondary | ICD-10-CM | POA: Diagnosis not present

## 2020-12-11 LAB — POCT INR: INR: 2 (ref 2.0–3.0)

## 2020-12-11 NOTE — Patient Instructions (Signed)
Continue warfarin 1 tablet daily Stop Lovenox injections   Recheck in 2 wks

## 2020-12-15 ENCOUNTER — Telehealth: Payer: Self-pay | Admitting: Cardiovascular Disease

## 2020-12-15 NOTE — Telephone Encounter (Signed)
*  STAT* If patient is at the pharmacy, call can be transferred to refill team.   1. Which medications need to be refilled? (please list name of each medication and dose if known)  carvedilol (COREG) 6.25 MG tablet  2. Which pharmacy/location (including street and city if local pharmacy) is medication to be sent to? [facility] - [place], [place] - [place] [address]  3. Do they need a 30 day or 90 day supply? 90   Patient is no longer using [facility] mail order. [facility]

## 2020-12-16 MED ORDER — CARVEDILOL 6.25 MG PO TABS: 6.2500 mg | ORAL_TABLET | Freq: Two times a day (BID) | ORAL | 1 refills | Status: DC

## 2020-12-18 NOTE — Progress Notes (Signed)
[facility] Note  12/22/2020     CHIEF COMPLAINT Patient presents for Retina Follow Up   HISTORY OF PRESENT ILLNESS: [name] is a 80 y.o. female who presents to the clinic today for:  HPI     Retina Follow Up   Patient presents with  Wet AMD.  In right eye.  Duration of 8 weeks.  Since onset it is stable.  I, the attending physician,  performed the HPI with the patient and updated documentation appropriately.        Comments   Pt here for 8 week retinal follow up ARMD OD. Pt states vision is about the same, no changes. No ocular pain or discomfort. Pt does report having recent surgery due to breast cancer, will have to under go treatment.       Last edited by Bernarda Caffey, MD on 12/22/2020  4:16 PM.    F/u delayed to 12 wks instead of 8 due to lumpectomy for breast mass -- diagnosed w/ DCIS   Referring physician: Sharilyn Sites, MD [address] [place],  [place] [address]  HISTORICAL INFORMATION:   Selected notes from the MEDICAL RECORD NUMBER Referred by Dr. Madelin Headings for concern of SRF OD LEE: 11.27.20 (M. Cotter) [BCVA: OD: 20/80-- OS: 20/60-]  Ocular Hx-glaucoma (latanoprost)  PMH-DM    CURRENT MEDICATIONS: Current Outpatient Medications (Ophthalmic Drugs)  Medication Sig   brimonidine (ALPHAGAN) 0.2 % ophthalmic solution Place 1 drop into the right eye 2 (two) times daily.   dorzolamide-timolol (COSOPT) 22.3-6.8 MG/ML ophthalmic solution INSTILL 1 DROP INTO RIGHT EYE TWICE A DAY (Patient taking differently: Place 1 drop into the right eye 2 (two) times daily.)   latanoprost (XALATAN) 0.005 % ophthalmic solution Place 1 drop into both eyes at bedtime.    tetrahydrozoline 0.05 % ophthalmic solution Place 1 drop into both eyes at bedtime.   No current facility-administered medications for this visit. (Ophthalmic Drugs)   Current Outpatient Medications (Other)  Medication Sig   albuterol (PROVENTIL HFA;VENTOLIN HFA) 108  (90 BASE) MCG/ACT inhaler Inhale 2 puffs into the lungs every 4 (four) hours as needed for shortness of breath.   amitriptyline (ELAVIL) 25 MG tablet Take 25 mg by mouth at bedtime.   Black Cohosh 40 MG CAPS Take 40 mg by mouth 2 (two) times daily.   carvedilol (COREG) 6.25 MG tablet Take 1 tablet (6.25 mg total) by mouth 2 (two) times daily.   cetirizine (ZYRTEC) 10 MG tablet Take 10 mg by mouth daily.   Cholecalciferol (VITAMIN D-3) 1000 units CAPS Take 1,000 Units by mouth daily.   diazepam (VALIUM) 2 MG tablet Take 2 mg by mouth 2 (two) times daily as needed for anxiety (dizziness).   enoxaparin (LOVENOX) 120 MG/0.8ML injection Inject 0.8 mLs (120 mg total) into the skin daily.   esomeprazole (NEXIUM) 20 MG capsule Take 20 mg by mouth daily at 12 noon.   FARXIGA 5 MG TABS tablet Take 5 mg by mouth every morning.   furosemide (LASIX) 40 MG tablet TAKE 1 TABLET (40 MG TOTAL) BY MOUTH DAILY. MAY TAKE EXTRA DAILY AS NEEDED FOR SWELLING (Patient taking differently: Take 40 mg by mouth 2 (two) times daily as needed for edema.)   gabapentin (NEURONTIN) 300 MG capsule Take 300 mg by mouth 3 (three) times daily.   glimepiride (AMARYL) 2 MG tablet Take 2 mg by mouth daily.   hydrALAZINE (APRESOLINE) 25 MG tablet Take 1 tablet (25 mg total)  by mouth in the morning and at bedtime.   isosorbide mononitrate (IMDUR) 30 MG 24 hr tablet Take 1 tablet (30 mg total) by mouth daily.   meclizine (ANTIVERT) 25 MG tablet Take 25 mg by mouth 2 (two) times daily as needed for dizziness.   Multiple Vitamins-Minerals (PRESERVISION AREDS 2 PO) Take 1 capsule by mouth in the morning and at bedtime.   Omega-3 Fatty Acids (FISH OIL) 1200 MG CAPS Take 1,200 mg by mouth 2 (two) times daily.   polyethylene glycol (MIRALAX / GLYCOLAX) packet Take 17 g by mouth daily.   potassium chloride SA (KLOR-CON) 20 MEQ tablet Take 0.5 tablets (10 mEq total) by mouth daily.   TRADJENTA 5 MG TABS tablet Take 5 mg by mouth daily.   vitamin  B-12 (CYANOCOBALAMIN) 100 MCG tablet Take 100 mcg by mouth daily.   warfarin (COUMADIN) 2 MG tablet Take 1 tablet (2 mg total) by mouth as directed. (Patient taking differently: Take 2 mg by mouth See admin instructions. Take 2 mg by mouth daily except for Friday take 1 mg.)   No current facility-administered medications for this visit. (Other)      REVIEW OF SYSTEMS: ROS   Positive for: Genitourinary, Endocrine, Cardiovascular, Eyes Negative for: Constitutional, Gastrointestinal, Neurological, Skin, Musculoskeletal, HENT, Respiratory, Psychiatric, Allergic/Imm, Heme/Lymph Last edited by Kingsley Spittle, COT on 12/22/2020  2:23 PM.        ALLERGIES Allergies  Allergen Reactions   Diflunisal Swelling    Other reaction(s): ENTIRE BODY SWELLING   Vioxx [Rofecoxib] Shortness Of Breath   Metformin And Related     Kidney failure   Nexlizet [Bempedoic Acid-Ezetimibe]     Causes elevated Liver and Kidney function   Pravastatin    Repatha [Evolocumab]     MYALGIAS   Codeine Rash   Elemental Sulfur Rash   Motrin [Ibuprofen] Rash   Penicillins Rash    PAST MEDICAL HISTORY Past Medical History:  Diagnosis Date   Antral gastritis    EGD 11/15   Asthmatic bronchitis    Back pain    Chronic diastolic heart failure ([place]) 05/20/2015   Grade 2 diastolic dysfunction.  04/2015.   Chronic kidney disease    kidney function low   Diabetes mellitus    x 5 yrs   DVT of axillary vein, acute left (HCC) 07/24/12   GERD (gastroesophageal reflux disease)    Glaucoma    POAG OU   Heart murmur    History of hiatal hernia    History of kidney stones    Hyperlipidemia 05/20/2015   Hypertension    Hypertensive retinopathy    OU   Hypothyroidism    Kidney stones    Macular degeneration    Wet OD, Dry OS   Mixed hyperlipidemia    Peripheral venous insufficiency    Pinched nerve    right elbow   Pneumonia    Sigmoid diverticulitis    Vertigo    chonic   Past Surgical History:   Procedure Laterality Date   ABDOMINAL HYSTERECTOMY     BACK SURGERY     spinal    BREAST LUMPECTOMY WITH RADIOACTIVE SEED LOCALIZATION Right 12/03/2020   Procedure: RIGHT BREAST LUMPECTOMY WITH RADIOACTIVE SEED LOCALIZATION;  Surgeon: Donnie Mesa, MD;  Location: [facility];  Service: General;  Laterality: Right;   CARDIAC CATHETERIZATION N/A 05/26/2015   Procedure: Left Heart Cath and Coronary Angiography;  Surgeon: Jettie Booze, MD;  Location: [facility];  Service: Cardiovascular;  Laterality: N/A;   CATARACT EXTRACTION Bilateral    CHOLECYSTECTOMY     COLON SURGERY     COLONOSCOPY N/A 12/27/2013   Procedure: COLONOSCOPY;  Surgeon: Rogene Houston, MD;  [facility];  Service: Endoscopy;  Laterality: N/A;  200   COLOSTOMY CLOSURE     ESOPHAGOGASTRODUODENOSCOPY N/A 05/07/2014   Procedure: ESOPHAGOGASTRODUODENOSCOPY (EGD);  Surgeon: Rogene Houston, MD;  [facility];  Service: Endoscopy;  Laterality: N/A;   EYE SURGERY Bilateral    Cat Sx   fracture left foot     HERNIA REPAIR     NM MYOCAR PERF WALL MOTION  01/28/2009   Normal   OTHER SURGICAL HISTORY     colostomy, colostomy reversal, for diverticulitis surgical hernia repair, arm surgery, neck surgery   US ECHOCARDIOGRAPHY  02/11/2010   Mild MR,trace TR & AI    FAMILY HISTORY Family History  Problem Relation Age of Onset   Other Mother 11       Cause unknown   Cancer Mother        liver   CVA Maternal Grandmother 90       deceased   Heart disease Maternal Grandmother    Stroke Maternal Grandmother    Heart attack Brother 63       deceased   Cancer Sister 66       deceased   Glaucoma Maternal Uncle     SOCIAL HISTORY Social History   Tobacco Use   Smoking status: Former    Pack years: 0.00    Types: Cigarettes    Quit date: 12/10/2013    Years since quitting: 7.0   Smokeless tobacco: Never   Tobacco comments:    Smoke 1-1 1/2 packs a day  Vaping Use   Vaping Use: Never used   Substance Use Topics   Alcohol use: No   Drug use: No         OPHTHALMIC EXAM:  Base Eye Exam     Visual Acuity (Snellen - Linear)       Right Left   Dist [place] 20/25 -2 20/30 +1   Dist ph [place] 20/25 20/20 -1         Tonometry (Tonopen, 2:35 PM)       Right Left   Pressure 22 19         Pupils       Dark Light Shape React APD   Right 2 1 Round Brisk None   Left 2 1 Round Brisk None         Visual Fields (Counting fingers)       Left Right    Full Full         Extraocular Movement       Right Left    Full, Ortho Full, Ortho         Neuro/Psych     Oriented x3: Yes   Mood/Affect: Normal         Dilation     Both eyes: 1.0% Mydriacyl, 2.5% Phenylephrine @ 2:35 PM           Slit Lamp and Fundus Exam     Slit Lamp Exam       Right Left   Lids/Lashes Dermatochalasis - upper lid, mild Meibomian gland dysfunction Dermatochalasis - upper lid, Telangiectasia, mild Meibomian gland dysfunction   Conjunctiva/Sclera White and quiet White and quiet   Cornea 3+Punctate epithelial erosions, mild arcus, central haze, well healed cataract wound 1-2+  fine Punctate epithelial erosions, central haze   Anterior Chamber Deep and quiet, narrow temporal angle Deep and quiet, narrow temporal angle   Iris Round and dilated Round and moderately dilated to 5.30mm   Lens Posterior chamber intraocular lens, open PC Posterior chamber intraocular lens   Vitreous Vitreous syneresis, Posterior vitreous detachment Vitreous syneresis         Fundus Exam       Right Left   Disc Sharp rim, Pallor, +cupping, mild, temporal Peripapillary atrophy, superior rim thinning Pink and Sharp, temporal Peripapillary atrophy   C/D Ratio 0.75 0.5   Macula Flat, Blunted foveal reflex, +focal CNV/PED nasal macula with partial pigment ring, mild interval increase in SRF, Drusen, no heme, RPE mottling and clumping Flat, Blunted foveal reflex, drusen, trace ERM, Retinal pigment  epithelial mottling and clumping, No heme or edema   Vessels Vascular attenuation, Tortuous Vascular attenuation, Tortuous   Periphery Attached, mild reticular degeneration, No heme, No RT/RD Attached; no heme            IMAGING AND PROCEDURES  Imaging and Procedures for @TODAY @  OCT, Retina - OU - Both Eyes       Right Eye Quality was good. Central Foveal Thickness: 196. Progression has worsened. Findings include intraretinal hyper-reflective material, pigment epithelial detachment, subretinal hyper-reflective material, retinal drusen , normal foveal contour, outer retinal atrophy, no IRF, subretinal fluid (Mild interval increase in SRF overlying nasal PED, partial PVD).   Left Eye Quality was good. Central Foveal Thickness: 211. Progression has been stable. Findings include normal foveal contour, no IRF, no SRF, retinal drusen .   Notes *Images captured and stored on drive  Diagnosis / Impression:  OD: exudative ARMD; Mild interval increase in SRD overlying nasal PED, partial PVD OS: NFP, no IRF/SRF; +drusen -- nonexudative ARMD  Clinical management:  See below  Abbreviations: NFP - Normal foveal profile. CME - cystoid macular edema. PED - pigment epithelial detachment. IRF - intraretinal fluid. SRF - subretinal fluid. EZ - ellipsoid zone. ERM - epiretinal membrane. ORA - outer retinal atrophy. ORT - outer retinal tubulation. SRHM - subretinal hyper-reflective material      Intravitreal Injection, Pharmacologic Agent - OD - Right Eye       Time Out 12/22/2020. 2:58 PM. Confirmed correct patient, procedure, site, and patient consented.   Anesthesia Topical anesthesia was used. Anesthetic medications included Lidocaine 2%, Proparacaine 0.5%.   Procedure Preparation included 5% betadine to ocular surface, eyelid speculum. A supplied (32g) needle was used.   Injection: 1.25 mg Bevacizumab 1.25mg /0.58ml   Route: Intravitreal, Site: Right Eye   NDC: H061816, Lot:  [id number], Expiration date: 02/13/2021, Waste: 0.05 mL   Post-op Post injection exam found visual acuity of at least counting fingers. The patient tolerated the procedure well. There were no complications. The patient received written and verbal post procedure care education. Post injection medications were not given.   Notes                ASSESSMENT/PLAN:    ICD-10-CM   1. Exudative age-related macular degeneration of right eye with active choroidal neovascularization (HCC)  H35.3211 Intravitreal Injection, Pharmacologic Agent - OD - Right Eye    Bevacizumab (AVASTIN) SOLN 1.25 mg    2. Retinal edema  H35.81 OCT, Retina - OU - Both Eyes    3. Intermediate stage nonexudative age-related macular degeneration of left eye  H35.3122     4. Diabetes mellitus type 2 without retinopathy ([place])  E11.9  5. Essential hypertension  I10     6. Hypertensive retinopathy of both eyes  H35.033     7. Pseudophakia of both eyes  Z96.1     8. PCO (posterior capsular opacification), right  H26.491     9. Primary open angle glaucoma of both eyes, unspecified glaucoma stage  H40.1130      1,2. 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, OD    - delayed f/u from 8 wks to 12 due to lumpectomy -- dx'd w/ DCIS  - h/o delayed follow up from 4 weeks to 8 weeks due to passing of husband (12.11.20-02.12.21) - s/p IVA OD #1 (12.11.20), #2 (02.12.21), #3 (03.12.21), #4 (04.09.21), #5 (05.14.21), #6 (06.17.21), #7 (07.23.21), #8 (10.15.21), #9 (12.7.21), #10 (03.22.22)  - FA 12.11.20 confirms +CNVM  - OCT today shows Mild interval increase in SRF overlying nasal PED, partial PVD  - BCVA OD 20/25 (improved)   - recommend IVA OD #11 today, 06.20.22 w f/u in 9-10 wks  - pt wishes to proceed  - RBA of procedure discussed, questions answered - informed consent obtained and signed - see procedure note - Avastin informed consent form signed and scanned on 12.11.2020 (OD)             - Continue to use AT  OD  - f/u 9-10 weeks -- DFE/OCT/possible injection; treat and extend as able  3. Age related macular degeneration, non-exudative, OS  - intermediate stage  - The incidence, anatomy, and pathology of dry AMD, risk of progression, and the AREDS and AREDS 2 study including smoking risks discussed with patient.  - recommend Amsler grid monitoring  4. Diabetes mellitus, type 2 without retinopathy  - The incidence, risk factors for progression, natural history and treatment options for diabetic retinopathy  were discussed with patient.    - The need for close monitoring of blood glucose, blood pressure, and serum lipids, avoiding cigarette or any type of tobacco, and the need for long term follow up was also discussed with patient.  - monitor  5,6. Hypertensive retinopathy OU  - discussed importance of tight BP control  - monitor  7,8. Pseudophakia OU, PCO OD  - s/p CE/IOL OU (Dr. Venetia Maxon)  - IOL in good position  - s/p yag cap OD (04.06.22) -- good PC opening  - monitor  9. POAG OU  - formerly managed by Dr. Venetia Maxon  - s/p laser w/ Dr. Venetia Maxon -- ?SLT  - IOP 22,19  - currently on latanoprost QHS OU  - cont Cosopt BID OD only  - add brimonidine BID OD only -- pt self d/c due to itching  - monitor  Ophthalmic Meds Ordered this visit:  Meds ordered this encounter  Medications   Bevacizumab (AVASTIN) SOLN 1.25 mg       Return for 9-10 wks; ex ARMD OD, Dilated Exam, OCT, Possible Injxn.  There are no Patient Instructions on file for this visit.  This document serves as a record of services personally performed by Gardiner Sleeper, MD, PhD. It was created on their behalf by Leeann Must, [place], an ophthalmic technician. The creation of this record is the provider's dictation and/or activities during the visit.    Electronically signed by: Leeann Must, COA @TODAY @ 4:22 PM  This document serves as a record of services personally performed by Gardiner Sleeper, MD, PhD. It was  created on their behalf by San Jetty. Owens Shark, OA an ophthalmic technician. The creation of this record is the provider's dictation  and/or activities during the visit.    Electronically signed by: San Jetty. Owens Shark, [place] 06.20.2022 4:22 PM  Gardiner Sleeper, M.D., Ph.D. Diseases & Surgery of the Retina and [place] 12/22/2020    I have reviewed the above documentation for accuracy and completeness, and I agree with the above. Gardiner Sleeper, M.D., Ph.D. 12/22/20 4:22 PM   Abbreviations: M myopia (nearsighted); A astigmatism; H hyperopia (farsighted); P presbyopia; Mrx spectacle prescription;  CTL contact lenses; OD right eye; OS left eye; OU both eyes  XT exotropia; ET esotropia; PEK punctate epithelial keratitis; PEE punctate epithelial erosions; DES dry eye syndrome; MGD meibomian gland dysfunction; ATs artificial tears; PFAT's preservative free artificial tears; [place] nuclear sclerotic cataract; PSC posterior subcapsular cataract; ERM epi-retinal membrane; PVD posterior vitreous detachment; RD retinal detachment; DM diabetes mellitus; DR diabetic retinopathy; NPDR non-proliferative diabetic retinopathy; PDR proliferative diabetic retinopathy; CSME clinically significant macular edema; DME diabetic macular edema; dbh dot blot hemorrhages; CWS cotton wool spot; POAG primary open angle glaucoma; C/D cup-to-disc ratio; HVF humphrey visual field; GVF goldmann visual field; OCT optical coherence tomography; IOP intraocular pressure; BRVO Branch retinal vein occlusion; CRVO central retinal vein occlusion; CRAO central retinal artery occlusion; BRAO branch retinal artery occlusion; RT retinal tear; SB scleral buckle; PPV pars plana vitrectomy; VH Vitreous hemorrhage; PRP panretinal laser photocoagulation; IVK intravitreal kenalog; VMT vitreomacular traction; MH Macular hole;  NVD neovascularization of the disc; NVE neovascularization elsewhere; AREDS age related eye disease study;  ARMD age related macular degeneration; POAG primary open angle glaucoma; EBMD epithelial/anterior basement membrane dystrophy; ACIOL anterior chamber intraocular lens; IOL intraocular lens; PCIOL posterior chamber intraocular lens; Phaco/IOL phacoemulsification with intraocular lens placement; [place] photorefractive keratectomy; LASIK laser assisted in situ keratomileusis; HTN hypertension; DM diabetes mellitus; COPD chronic obstructive pulmonary disease

## 2020-12-22 ENCOUNTER — Encounter (INDEPENDENT_AMBULATORY_CARE_PROVIDER_SITE_OTHER): Payer: Self-pay | Admitting: Ophthalmology

## 2020-12-22 ENCOUNTER — Ambulatory Visit (INDEPENDENT_AMBULATORY_CARE_PROVIDER_SITE_OTHER): Payer: Medicare Other | Admitting: Ophthalmology

## 2020-12-22 ENCOUNTER — Other Ambulatory Visit: Payer: Self-pay

## 2020-12-22 DIAGNOSIS — I10 Essential (primary) hypertension: Secondary | ICD-10-CM

## 2020-12-22 DIAGNOSIS — H353211 Exudative age-related macular degeneration, right eye, with active choroidal neovascularization: Secondary | ICD-10-CM | POA: Diagnosis not present

## 2020-12-22 DIAGNOSIS — H353122 Nonexudative age-related macular degeneration, left eye, intermediate dry stage: Secondary | ICD-10-CM | POA: Diagnosis not present

## 2020-12-22 DIAGNOSIS — H26491 Other secondary cataract, right eye: Secondary | ICD-10-CM

## 2020-12-22 DIAGNOSIS — Z961 Presence of intraocular lens: Secondary | ICD-10-CM

## 2020-12-22 DIAGNOSIS — H401130 Primary open-angle glaucoma, bilateral, stage unspecified: Secondary | ICD-10-CM | POA: Diagnosis not present

## 2020-12-22 DIAGNOSIS — E119 Type 2 diabetes mellitus without complications: Secondary | ICD-10-CM | POA: Diagnosis not present

## 2020-12-22 DIAGNOSIS — H3581 Retinal edema: Secondary | ICD-10-CM | POA: Diagnosis not present

## 2020-12-22 DIAGNOSIS — H35033 Hypertensive retinopathy, bilateral: Secondary | ICD-10-CM | POA: Diagnosis not present

## 2020-12-22 MED ORDER — BEVACIZUMAB CHEMO INJECTION 1.25MG/0.05ML SYRINGE FOR KALEIDOSCOPE
1.2500 mg | INTRAVITREAL | Status: AC | PRN
  Administered 2020-12-22: 1.25 mg via INTRAVITREAL

## 2020-12-22 NOTE — Progress Notes (Incomplete)
Cardiology Office Note   Date:  12/22/2020   ID:  [name], DOB Oct 16, 1940, MRN [id number]  PCP:  Sharilyn Sites, MD  Cardiologist:   Madelin Rear   No chief complaint on file.    History of Present Illness: [name] is a 80 y.o. female with CAD (50% LAD), chronic diastolic heart failure (grade 2), hypertension, hyperlipidemia, prior DVT on warfarin, and diabetes type 2 who presents for follow up.  She was first seen 04/2015 at which time she reported occasional chest pain.  She had an exercise Myoview 04/2015 that showed LVEF 78% with a small defect of moderate severity in the mid anterior and apical anterior region.  This was felt to be due to breast attenuation artifact.  However, she underwent cardiac catheterization on 05/26/15 that revealed a 50% LAD lesion. There was concern that there may be a component of vasospasm so long-acting nitrates were started. Her blood pressure and hyperlipidemia medications have been titrated due to poor control.  She is also on Lasix due to lower extremity edema.  [name]' husband died of COVID double pneumonia on 07/2019.  He was treated with Remdesivir at [facility] outpatient infusion center.  She was experiencing atypical chest pain.  She was referred for [facility] that revealed LVEF 74% with no ischemia.  Since then she called our office with concern for dizziness.  She spoke with our pharmacist and was advised to stop her hydralazine.  However her dizziness did not improve and it was not thought to be due to the medication or her blood pressure.  She had a dizzy episode while sitting on the toilet and had to grab the garbage can to vomit.  They recommended that she see her PCP.  She had vertigo in the past and has used meclizine.  Her LFTs were elevated so her PCP stopped her pravastatin.  She since started Praluent. She fell a couple times and feels off balance at times.  She did not hit her head.  She has been more active but not  getting formal exercise.  Her BP at home has been stable.  She gets stressed by driving here and not having anyone with her.  She has some sock imprint but no significant edema, orthopnea or PND. At her last appointment her blood pressure was elevated. Her dizziness episodes were thought to be due to vertigo, so Hydralazine was started at a lower dose.  [name] was found to have right breast cancer and had radioactive seeds implanted 12/2020.  Today,  She denies any chest pain, shortness of breath, palpitations, or exertional symptoms. No headaches, lightheadedness, or syncope to report. Also has no lower extremity edema, orthopnea or PND.    Past Medical History:  Diagnosis Date   Antral gastritis    EGD 11/15   Asthmatic bronchitis    Back pain    Chronic diastolic heart failure ([place]) 05/20/2015   Grade 2 diastolic dysfunction.  04/2015.   Chronic kidney disease    kidney function low   Diabetes mellitus    x 5 yrs   DVT of axillary vein, acute left (HCC) 07/24/12   GERD (gastroesophageal reflux disease)    Glaucoma    POAG OU   Heart murmur    History of hiatal hernia    History of kidney stones    Hyperlipidemia 05/20/2015   Hypertension    Hypertensive retinopathy    OU   Hypothyroidism    Kidney stones  Macular degeneration    Wet OD, Dry OS   Mixed hyperlipidemia    Peripheral venous insufficiency    Pinched nerve    right elbow   Pneumonia    Sigmoid diverticulitis    Vertigo    chonic    Past Surgical History:  Procedure Laterality Date   ABDOMINAL HYSTERECTOMY     BACK SURGERY     spinal    BREAST LUMPECTOMY WITH RADIOACTIVE SEED LOCALIZATION Right 12/03/2020   Procedure: RIGHT BREAST LUMPECTOMY WITH RADIOACTIVE SEED LOCALIZATION;  Surgeon: Donnie Mesa, MD;  Location: [place];  Service: General;  Laterality: Right;   CARDIAC CATHETERIZATION N/A 05/26/2015   Procedure: Left Heart Cath and Coronary Angiography;  Surgeon: Jettie Booze, MD;   Location: [place] [facility];  Service: Cardiovascular;  Laterality: N/A;   CATARACT EXTRACTION Bilateral    CHOLECYSTECTOMY     COLON SURGERY     COLONOSCOPY N/A 12/27/2013   Procedure: COLONOSCOPY;  Surgeon: Rogene Houston, MD;  [facility];  Service: Endoscopy;  Laterality: N/A;  200   COLOSTOMY CLOSURE     ESOPHAGOGASTRODUODENOSCOPY N/A 05/07/2014   Procedure: ESOPHAGOGASTRODUODENOSCOPY (EGD);  Surgeon: Rogene Houston, MD;  Location: [facility];  Service: Endoscopy;  Laterality: N/A;   EYE SURGERY Bilateral    Cat Sx   fracture left foot     HERNIA REPAIR     NM MYOCAR PERF WALL MOTION  01/28/2009   Normal   OTHER SURGICAL HISTORY     colostomy, colostomy reversal, for diverticulitis surgical hernia repair, arm surgery, neck surgery   US ECHOCARDIOGRAPHY  02/11/2010   Mild MR,trace TR & AI     Current Outpatient Medications  Medication Sig Dispense Refill   albuterol (PROVENTIL HFA;VENTOLIN HFA) 108 (90 BASE) MCG/ACT inhaler Inhale 2 puffs into the lungs every 4 (four) hours as needed for shortness of breath. 1 Inhaler 0   amitriptyline (ELAVIL) 25 MG tablet Take 25 mg by mouth at bedtime.     Black Cohosh 40 MG CAPS Take 40 mg by mouth 2 (two) times daily.     brimonidine (ALPHAGAN) 0.2 % ophthalmic solution Place 1 drop into the right eye 2 (two) times daily. 15 mL 6   carvedilol (COREG) 6.25 MG tablet Take 1 tablet (6.25 mg total) by mouth 2 (two) times daily. 180 tablet 1   cetirizine (ZYRTEC) 10 MG tablet Take 10 mg by mouth daily.     Cholecalciferol (VITAMIN D-3) 1000 units CAPS Take 1,000 Units by mouth daily.     diazepam (VALIUM) 2 MG tablet Take 2 mg by mouth 2 (two) times daily as needed for anxiety (dizziness).     dorzolamide-timolol (COSOPT) 22.3-6.8 MG/ML ophthalmic solution INSTILL 1 DROP INTO RIGHT EYE TWICE A DAY (Patient taking differently: Place 1 drop into the right eye 2 (two) times daily.) 30 mL 2   enoxaparin (LOVENOX) 120 MG/0.8ML injection  Inject 0.8 mLs (120 mg total) into the skin daily. 2.4 mL 1   esomeprazole (NEXIUM) 20 MG capsule Take 20 mg by mouth daily at 12 noon.     FARXIGA 5 MG TABS tablet Take 5 mg by mouth every morning.     furosemide (LASIX) 40 MG tablet TAKE 1 TABLET (40 MG TOTAL) BY MOUTH DAILY. MAY TAKE EXTRA DAILY AS NEEDED FOR SWELLING (Patient taking differently: Take 40 mg by mouth 2 (two) times daily as needed for edema.) 180 tablet 1   gabapentin (NEURONTIN) 300 MG capsule  Take 300 mg by mouth 3 (three) times daily.     glimepiride (AMARYL) 2 MG tablet Take 2 mg by mouth daily.  2   hydrALAZINE (APRESOLINE) 25 MG tablet Take 1 tablet (25 mg total) by mouth in the morning and at bedtime. 180 tablet 3   isosorbide mononitrate (IMDUR) 30 MG 24 hr tablet Take 1 tablet (30 mg total) by mouth daily. 90 tablet 3   latanoprost (XALATAN) 0.005 % ophthalmic solution Place 1 drop into both eyes at bedtime.      meclizine (ANTIVERT) 25 MG tablet Take 25 mg by mouth 2 (two) times daily as needed for dizziness.     Multiple Vitamins-Minerals (PRESERVISION AREDS 2 PO) Take 1 capsule by mouth in the morning and at bedtime.     Omega-3 Fatty Acids (FISH OIL) 1200 MG CAPS Take 1,200 mg by mouth 2 (two) times daily.     polyethylene glycol (MIRALAX / GLYCOLAX) packet Take 17 g by mouth daily.     potassium chloride SA (KLOR-CON) 20 MEQ tablet Take 0.5 tablets (10 mEq total) by mouth daily. 45 tablet 9   tetrahydrozoline 0.05 % ophthalmic solution Place 1 drop into both eyes at bedtime.     TRADJENTA 5 MG TABS tablet Take 5 mg by mouth daily.     vitamin B-12 (CYANOCOBALAMIN) 100 MCG tablet Take 100 mcg by mouth daily.     warfarin (COUMADIN) 2 MG tablet Take 1 tablet (2 mg total) by mouth as directed. (Patient taking differently: Take 2 mg by mouth See admin instructions. Take 2 mg by mouth daily except for Friday take 1 mg.) 60 tablet 0   No current facility-administered medications for this visit.    Allergies:    Diflunisal, Vioxx [rofecoxib], Metformin and related, Nexlizet [bempedoic acid-ezetimibe], Pravastatin, Repatha [evolocumab], Codeine, Elemental sulfur, Motrin [ibuprofen], and Penicillins    Social History:  The patient  reports that she quit smoking about 7 years ago. Her smoking use included cigarettes. She has never used smokeless tobacco. She reports that she does not drink alcohol and does not use drugs.   Family History:  The patient's family history includes CVA (age of onset: 40) in her maternal grandmother; Cancer in her mother; Cancer (age of onset: 93) in her sister; Glaucoma in her maternal uncle; Heart attack (age of onset: 39) in her brother; Heart disease in her maternal grandmother; Other (age of onset: 71) in her mother; Stroke in her maternal grandmother.    ROS:   Please see the history of present illness. (+) All other systems are reviewed and negative.    PHYSICAL EXAM: VS:  There were no vitals taken for this visit. , BMI There is no height or weight on file to calculate BMI. GENERAL:  Well appearing HEENT: Pupils equal round and reactive, fundi not visualized, oral mucosa unremarkable NECK:  No jugular venous distention, waveform within normal limits, carotid upstroke brisk and symmetric, no bruit LUNGS:  Clear to auscultation bilaterally HEART:  RRR.  PMI not displaced or sustained,S1 and S2 within normal limits, no S3, no S4, no clicks, no rubs, no murmurs ABD:  Flat, positive bowel sounds normal in frequency in pitch, no bruits, no rebound, no guarding, no midline pulsatile mass, no hepatomegaly, no splenomegaly EXT:  2 plus pulses throughout, trace LE edema***, no cyanosis no clubbing SKIN:  No rashes no nodules NEURO:  Cranial nerves II through XII grossly intact, motor grossly intact throughout PSYCH:  Cognitively intact, oriented to person  place and time   EKG:   02/16/16: Sinus rhythm rate 83 bpm.  PAC.  RBBB.  LPFB. 03/02/17: Sinus rhythm.  RBBB.   LPFB 09/15/17: Sinus rhythm.  Rate 64 bpm. RBBB  09/28/2019: Sinus rhythm.  Rate 75 bpm.  RBBB. 04/11/20: Sinus rhythm.  Rate 75 bpm.  RBBB.   06/24/2020: EKG was not ordered. 12/24/2020: ***  Lexiscan Myoview 10/2019: Nuclear stress EF: 74%. The left ventricular ejection fraction is hyperdynamic (>65%). There was no ST segment deviation noted during stress. This is a low risk study. There is no evidence of ischemia or previous infarction. The study is normal.  Exercise Myoview 04/30/15 The left ventricular ejection fraction is hyperdynamic (>65%). Nuclear stress EF: 78%. Blood pressure demonstrated a hypertensive response to exercise. Upsloping ST segment depression ST segment depression was noted during stress in the III, II and aVF leads. This is not diagnostic for ischemia. Defect 1: There is a small defect of moderate severity present in the mid anterior and apical anterior location. Wall motion is normal, so this is likely artifact due to breast attenuation. However, cannot rule out LAD ischemia. This is a low risk study.  Cardiac cath 05/26/15: Mid LAD lesion, 50% stenosed. FFR of this lesion showed value of 0.85. This is felt to be nonsignificant. Normal LVEDP.   Continue aggressive medical therapy. Would consider adding long-acting nitrate as the patient may have some component of vasospasm. Of note, in the future , if repeat catheterization was needed , would consider using a 5 [place] guide catheter as her left main is very short and the catheter tends to deep seat  Into either the LAD or the circumflex.  Recent Labs: 11/25/2020: BUN 19; Creatinine, Ser 1.15; Hemoglobin 14.0; Platelets 247; Potassium 3.9; Sodium 141   03/12/15: INR 2.9   Lipid Panel    Component Value Date/Time   CHOL 225 (H) 10/05/2019 0859   TRIG 189 (H) 10/05/2019 0859   HDL 43 10/05/2019 0859   CHOLHDL 5.2 (H) 10/05/2019 0859   CHOLHDL 4.9 08/17/2016 0914   VLDL 33 (H) 08/17/2016 0914   LDLCALC 148 (H)  10/05/2019 0859      Wt Readings from Last 3 Encounters:  12/03/20 146 lb 9.7 oz (66.5 kg)  11/25/20 146 lb 11.2 oz (66.5 kg)  06/24/20 178 lb (80.7 kg)      ASSESSMENT AND PLAN: No problem-specific Assessment & Plan notes found for this encounter.  # Non-obstructive CAD:  # Hyperlipidemia: # Chest pain: She is no longer having chest pain.  She had 50% LAD stenosis.  Lexiscan Myoview was negative 10/2019.  It seems that her chest pain has been more related to the stress of her husband dying and has since resolved.  Continue carvedilol, and Imdur.  Her statin was held due to transaminitis.  She is tolerating Praluent well.    # Chronic diastolic heart failure: # Hypertension:  Continue lasix, carvedilol, and losartan.  Hydralazine was discontinued when she was dizzy.  However stopping it did not improve her dizziness and it was actually due to BPPV.  We will restart hydralazine 25 mg twice daily.  She will track her pressures at home.  # DVT:  Continue warfarin.  INR was 2.7 today.  She wants to follow with our coumadin clinic in [place] so that she can get Accuchecks instead of venous draws.   Current medicines are reviewed at length with the patient today.  The patient does not have concerns regarding medicines.  The  following changes have been made:  no change  Labs/ tests ordered today include:   No orders of the defined types were placed in this encounter.   Disposition:   FU with Tiffany C. Oval Linsey, MD in ***6 months.    I,Mathew Stumpf,acting as a Education administrator for Skeet Latch, MD.,have documented all relevant documentation on the behalf of Skeet Latch, MD,as directed by  Skeet Latch, MD while in the presence of Skeet Latch, MD.  ***  Signed, Madelin Rear  12/22/2020 3:35 PM    [place]

## 2020-12-24 ENCOUNTER — Ambulatory Visit: Payer: Medicare Other | Admitting: Cardiovascular Disease

## 2020-12-25 ENCOUNTER — Other Ambulatory Visit: Payer: Self-pay

## 2020-12-25 ENCOUNTER — Telehealth: Payer: Self-pay | Admitting: Hematology and Oncology

## 2020-12-25 ENCOUNTER — Ambulatory Visit (INDEPENDENT_AMBULATORY_CARE_PROVIDER_SITE_OTHER): Payer: Medicare Other | Admitting: *Deleted

## 2020-12-25 DIAGNOSIS — I82A12 Acute embolism and thrombosis of left axillary vein: Secondary | ICD-10-CM | POA: Diagnosis not present

## 2020-12-25 DIAGNOSIS — Z7901 Long term (current) use of anticoagulants: Secondary | ICD-10-CM

## 2020-12-25 DIAGNOSIS — Z5181 Encounter for therapeutic drug level monitoring: Secondary | ICD-10-CM | POA: Diagnosis not present

## 2020-12-25 LAB — POCT INR
INR: 1.5 — AB (ref 2.0–3.0)
INR: 1.8 — AB (ref 2.0–3.0)

## 2020-12-25 NOTE — Patient Instructions (Signed)
Increase warfarin to 1 tablet daily except 1 1/2 tablets on Mondays and Thursdays Recheck in 3 wks in [place]

## 2020-12-25 NOTE — Telephone Encounter (Signed)
Received a pt referral from [facility] for [facility]. [name] has been cld and scheduled to see Dr. Lindi Adie on 6/27 at 4;15pm Pt aware to arrive 15 minutes early.

## 2020-12-28 NOTE — Progress Notes (Signed)
[facility] CONSULT NOTE  Patient Care Team: Sharilyn Sites, MD as PCP - General Skeet Latch, MD as PCP - Cardiology (Cardiology)  CHIEF COMPLAINTS/PURPOSE OF CONSULTATION:  Newly diagnosed right DCIS  HISTORY OF PRESENTING ILLNESS:  [name] 80 y.o. female is here because of recent diagnosis of DCIS of the right breast. Screening mammogram on 09/23/20 showed a showed calcifications and a possible mass warranting further evaluation in the right breast and no findings of malignancy on the left breast. Diagnostic mammogram and [place] on 09/12/20  2.8 x 1.6 cm group of linear and fine pleomorphic calcifications in the upper inner quadrant of the right breast.  Biopsy on 10/09/2020: Atypical ductal hyperplasia.  Right lumpectomy on 12/03/20 showed DCIS intermediate grade, with calcifications margins uninvolved by carcinoma (0.1 cm; medial margin) in the right breast. She presents to the clinic today for initial evaluation and discussion of treatment options.   I reviewed her records extensively and collaborated the history with the patient.  SUMMARY OF ONCOLOGIC HISTORY: Oncology History  Ductal carcinoma in situ (DCIS) of right breast  10/09/2020 Initial Diagnosis   Screening mammogram detected right breast calcifications: Initial biopsy showed focal atypical ductal hyperplasia   12/03/2020 Surgery   12/03/2020: Right lumpectomy:  intermediate grade DCIS ER 90% positive, PR negative margins negative     MEDICAL HISTORY:  Past Medical History:  Diagnosis Date   Antral gastritis    EGD 11/15   Asthmatic bronchitis    Back pain    Chronic diastolic heart failure ([place]) 05/20/2015   Grade 2 diastolic dysfunction.  04/2015.   Chronic kidney disease    kidney function low   Diabetes mellitus    x 5 yrs   DVT of axillary vein, acute left (HCC) 07/24/12   GERD (gastroesophageal reflux disease)    Glaucoma    POAG OU   Heart murmur    History of hiatal hernia    History  of kidney stones    Hyperlipidemia 05/20/2015   Hypertension    Hypertensive retinopathy    OU   Hypothyroidism    Kidney stones    Macular degeneration    Wet OD, Dry OS   Mixed hyperlipidemia    Peripheral venous insufficiency    Pinched nerve    right elbow   Pneumonia    Sigmoid diverticulitis    Vertigo    chonic    SURGICAL HISTORY: Past Surgical History:  Procedure Laterality Date   ABDOMINAL HYSTERECTOMY     BACK SURGERY     spinal    BREAST LUMPECTOMY WITH RADIOACTIVE SEED LOCALIZATION Right 12/03/2020   Procedure: RIGHT BREAST LUMPECTOMY WITH RADIOACTIVE SEED LOCALIZATION;  Surgeon: Donnie Mesa, MD;  Location: [facility];  Service: General;  Laterality: Right;   CARDIAC CATHETERIZATION N/A 05/26/2015   Procedure: Left Heart Cath and Coronary Angiography;  Surgeon: Jettie Booze, MD;  Location: [facility];  Service: Cardiovascular;  Laterality: N/A;   CATARACT EXTRACTION Bilateral    CHOLECYSTECTOMY     COLON SURGERY     COLONOSCOPY N/A 12/27/2013   Procedure: COLONOSCOPY;  Surgeon: Rogene Houston, MD;  [facility]: [facility];  Service: Endoscopy;  Laterality: N/A;  200   COLOSTOMY CLOSURE     ESOPHAGOGASTRODUODENOSCOPY N/A 05/07/2014   Procedure: ESOPHAGOGASTRODUODENOSCOPY (EGD);  Surgeon: Rogene Houston, MD;  Location: [facility];  Service: Endoscopy;  Laterality: N/A;   EYE SURGERY Bilateral    Cat Sx   fracture  left foot     HERNIA REPAIR     NM MYOCAR PERF WALL MOTION  01/28/2009   Normal   OTHER SURGICAL HISTORY     colostomy, colostomy reversal, for diverticulitis surgical hernia repair, arm surgery, neck surgery   US ECHOCARDIOGRAPHY  02/11/2010   Mild MR,trace TR & AI    SOCIAL HISTORY: Social History   Socioeconomic History   Marital status: Widowed    Spouse name: Not on file   Number of children: Not on file   Years of education: Not on file   Highest education level: Not on file  Occupational History   Not on file  Tobacco  Use   Smoking status: Former    Pack years: 0.00    Types: Cigarettes    Quit date: 12/10/2013    Years since quitting: 7.0   Smokeless tobacco: Never   Tobacco comments:    Smoke 1-1 1/2 packs a day  Vaping Use   Vaping Use: Never used  Substance and Sexual Activity   Alcohol use: No   Drug use: No   Sexual activity: Not Currently    Birth control/protection: Surgical    Comment: partial hysterectomy  Other Topics Concern   Not on file  Social History Narrative   Not on file   Social Determinants of Health   Financial Resource Strain: Not on file  Food Insecurity: Not on file  Transportation Needs: Not on file  Physical Activity: Not on file  Stress: Not on file  Social Connections: Not on file  Intimate Partner Violence: Not on file    FAMILY HISTORY: Family History  Problem Relation Age of Onset   Other Mother 51       Cause unknown   Cancer Mother        liver   CVA Maternal Grandmother 90       deceased   Heart disease Maternal Grandmother    Stroke Maternal Grandmother    Heart attack Brother 91       deceased   Cancer Sister 34       deceased   Glaucoma Maternal Uncle     ALLERGIES:  is allergic to diflunisal, vioxx [rofecoxib], metformin and related, nexlizet [bempedoic acid-ezetimibe], pravastatin, repatha [evolocumab], codeine, elemental sulfur, motrin [ibuprofen], and penicillins.  MEDICATIONS:  Current Outpatient Medications  Medication Sig Dispense Refill   anastrozole (ARIMIDEX) 1 MG tablet Take 1 tablet (1 mg total) by mouth daily. 90 tablet 3   albuterol (PROVENTIL HFA;VENTOLIN HFA) 108 (90 BASE) MCG/ACT inhaler Inhale 2 puffs into the lungs every 4 (four) hours as needed for shortness of breath. 1 Inhaler 0   amitriptyline (ELAVIL) 25 MG tablet Take 25 mg by mouth at bedtime.     Black Cohosh 40 MG CAPS Take 40 mg by mouth 2 (two) times daily.     brimonidine (ALPHAGAN) 0.2 % ophthalmic solution Place 1 drop into the right eye 2 (two) times  daily. 15 mL 6   carvedilol (COREG) 6.25 MG tablet Take 1 tablet (6.25 mg total) by mouth 2 (two) times daily. 180 tablet 1   cetirizine (ZYRTEC) 10 MG tablet Take 10 mg by mouth daily.     Cholecalciferol (VITAMIN D-3) 1000 units CAPS Take 1,000 Units by mouth daily.     diazepam (VALIUM) 2 MG tablet Take 2 mg by mouth 2 (two) times daily as needed for anxiety (dizziness).     dorzolamide-timolol (COSOPT) 22.3-6.8 MG/ML ophthalmic solution INSTILL 1 DROP INTO  RIGHT EYE TWICE A DAY (Patient taking differently: Place 1 drop into the right eye 2 (two) times daily.) 30 mL 2   enoxaparin (LOVENOX) 120 MG/0.8ML injection Inject 0.8 mLs (120 mg total) into the skin daily. 2.4 mL 1   esomeprazole (NEXIUM) 20 MG capsule Take 20 mg by mouth daily at 12 noon.     FARXIGA 5 MG TABS tablet Take 5 mg by mouth every morning.     furosemide (LASIX) 40 MG tablet TAKE 1 TABLET (40 MG TOTAL) BY MOUTH DAILY. MAY TAKE EXTRA DAILY AS NEEDED FOR SWELLING (Patient taking differently: Take 40 mg by mouth 2 (two) times daily as needed for edema.) 180 tablet 1   gabapentin (NEURONTIN) 300 MG capsule Take 300 mg by mouth 3 (three) times daily.     glimepiride (AMARYL) 2 MG tablet Take 2 mg by mouth daily.  2   hydrALAZINE (APRESOLINE) 25 MG tablet Take 1 tablet (25 mg total) by mouth in the morning and at bedtime. 180 tablet 3   isosorbide mononitrate (IMDUR) 30 MG 24 hr tablet Take 1 tablet (30 mg total) by mouth daily. 90 tablet 3   latanoprost (XALATAN) 0.005 % ophthalmic solution Place 1 drop into both eyes at bedtime.      meclizine (ANTIVERT) 25 MG tablet Take 25 mg by mouth 2 (two) times daily as needed for dizziness.     Multiple Vitamins-Minerals (PRESERVISION AREDS 2 PO) Take 1 capsule by mouth in the morning and at bedtime.     Omega-3 Fatty Acids (FISH OIL) 1200 MG CAPS Take 1,200 mg by mouth 2 (two) times daily.     polyethylene glycol (MIRALAX / GLYCOLAX) packet Take 17 g by mouth daily.     potassium chloride  SA (KLOR-CON) 20 MEQ tablet Take 0.5 tablets (10 mEq total) by mouth daily. 45 tablet 9   tetrahydrozoline 0.05 % ophthalmic solution Place 1 drop into both eyes at bedtime.     TRADJENTA 5 MG TABS tablet Take 5 mg by mouth daily.     vitamin B-12 (CYANOCOBALAMIN) 100 MCG tablet Take 100 mcg by mouth daily.     warfarin (COUMADIN) 2 MG tablet Take 1 tablet (2 mg total) by mouth as directed. (Patient taking differently: Take 2 mg by mouth See admin instructions. Take 2 mg by mouth daily except for Friday take 1 mg.) 60 tablet 0   No current facility-administered medications for this visit.    REVIEW OF SYSTEMS:   Constitutional: Denies fevers, chills or abnormal night sweats Breast: Discomfort in the right breast lumpectomy area All other systems were reviewed with the patient and are negative.  PHYSICAL EXAMINATION: ECOG PERFORMANCE STATUS: 1 - Symptomatic but completely ambulatory  Vitals:   12/29/20 1617  BP: 135/76  Pulse: 77  Resp: 18  Temp: 97.8 F (36.6 C)  SpO2: 95%   Filed Weights   12/29/20 1617  Weight: 177 lb (80.3 kg)     LABORATORY DATA:  I have reviewed the data as listed Lab Results  Component Value Date   WBC 6.8 11/25/2020   HGB 14.0 11/25/2020   HCT 43.4 11/25/2020   MCV 92.3 11/25/2020   PLT 247 11/25/2020   Lab Results  Component Value Date   NA 141 11/25/2020   K 3.9 11/25/2020   CL 106 11/25/2020   CO2 27 11/25/2020    RADIOGRAPHIC STUDIES: I have personally reviewed the radiological reports and agreed with the findings in the report.  ASSESSMENT AND  PLAN:  Ductal carcinoma in situ (DCIS) of right breast 12/03/2020: Screening mammogram detected right breast calcifications 2.8 cm biopsy revealed intermediate grade DCIS ER 90% positive, PR negative  Pathology review: I discussed with the patient the difference between DCIS and invasive breast cancer. It is considered a precancerous lesion. DCIS is classified as a 0. It is generally detected  through mammograms as calcifications. We discussed the significance of grades and its impact on prognosis. We also discussed the importance of ER and PR receptors and their implications to adjuvant treatment options. Prognosis of DCIS dependence on grade, comedo necrosis. It is anticipated that if not treated, 20-30% of DCIS can develop into invasive breast cancer.  Recommendation: 1. Breast conserving surgery   2. antiestrogen therapy with anastrozole 5 years  We discussed the pros and cons of adjuvant radiation.  Patient decided not to do radiation  With her history of blood clots, I do not recommend tamoxifen therapy. We can consider anastrozole. Anastrozole counseling: We discussed the risks and benefits of anti-estrogen therapy with aromatase inhibitors. These include but not limited to insomnia, hot flashes, mood changes, vaginal dryness, bone density loss, and weight gain. We strongly believe that the benefits far outweigh the risks. Patient understands these risks and consented to starting treatment. Planned treatment duration is 5 years.  Return to clinic in 3 months for survivorship care plan visit  All questions were answered. The patient knows to call the clinic with any problems, questions or concerns.   Rulon Eisenmenger, MD, MPH 12/29/2020    I, Thana Ates, am acting as scribe for [name], MD.  I have reviewed the above documentation for accuracy and completeness, and I agree with the above.

## 2020-12-29 ENCOUNTER — Inpatient Hospital Stay: Payer: Medicare Other | Attending: Hematology and Oncology | Admitting: Hematology and Oncology

## 2020-12-29 ENCOUNTER — Other Ambulatory Visit: Payer: Self-pay

## 2020-12-29 DIAGNOSIS — Z79811 Long term (current) use of aromatase inhibitors: Secondary | ICD-10-CM | POA: Diagnosis not present

## 2020-12-29 DIAGNOSIS — D0511 Intraductal carcinoma in situ of right breast: Secondary | ICD-10-CM | POA: Diagnosis not present

## 2020-12-29 DIAGNOSIS — Z170 Estrogen receptor positive status [ER+]: Secondary | ICD-10-CM | POA: Diagnosis not present

## 2020-12-29 DIAGNOSIS — Z87891 Personal history of nicotine dependence: Secondary | ICD-10-CM | POA: Diagnosis not present

## 2020-12-29 MED ORDER — ANASTROZOLE 1 MG PO TABS: 1.0000 mg | ORAL_TABLET | Freq: Every day | ORAL | 3 refills | Status: DC

## 2020-12-29 NOTE — Assessment & Plan Note (Signed)
12/03/2020: Screening mammogram detected right breast calcifications 2.8 cm biopsy revealed intermediate grade DCIS ER 90% positive, PR negative  Pathology review: I discussed with the patient the difference between DCIS and invasive breast cancer. It is considered a precancerous lesion. DCIS is classified as a 0. It is generally detected through mammograms as calcifications. We discussed the significance of grades and its impact on prognosis. We also discussed the importance of ER and PR receptors and their implications to adjuvant treatment options. Prognosis of DCIS dependence on grade, comedo necrosis. It is anticipated that if not treated, 20-30% of DCIS can develop into invasive breast cancer.  Recommendation: 1. Breast conserving surgery versus [place] clinical trial participation 2. Followed by adjuvant radiation therapy  Versus antiestrogen therapy with anastrozole 5 years  With her history of blood clots, I do not recommend tamoxifen therapy. We can consider anastrozole.

## 2020-12-30 ENCOUNTER — Encounter: Payer: Self-pay | Admitting: *Deleted

## 2020-12-30 ENCOUNTER — Telehealth: Payer: Self-pay | Admitting: Radiation Oncology

## 2021-01-01 DIAGNOSIS — E1165 Type 2 diabetes mellitus with hyperglycemia: Secondary | ICD-10-CM | POA: Diagnosis not present

## 2021-01-01 DIAGNOSIS — I10 Essential (primary) hypertension: Secondary | ICD-10-CM | POA: Diagnosis not present

## 2021-01-01 DIAGNOSIS — I2510 Atherosclerotic heart disease of native coronary artery without angina pectoris: Secondary | ICD-10-CM | POA: Diagnosis not present

## 2021-01-07 ENCOUNTER — Ambulatory Visit: Payer: Medicare Other

## 2021-01-07 ENCOUNTER — Ambulatory Visit: Payer: Medicare Other | Admitting: Radiation Oncology

## 2021-01-07 ENCOUNTER — Telehealth: Payer: Self-pay | Admitting: Radiation Oncology

## 2021-01-08 ENCOUNTER — Other Ambulatory Visit: Payer: Self-pay | Admitting: Cardiovascular Disease

## 2021-01-13 ENCOUNTER — Ambulatory Visit: Payer: Medicare Other | Admitting: *Deleted

## 2021-01-13 ENCOUNTER — Other Ambulatory Visit: Payer: Self-pay

## 2021-01-13 DIAGNOSIS — I82A12 Acute embolism and thrombosis of left axillary vein: Secondary | ICD-10-CM

## 2021-01-13 DIAGNOSIS — Z5181 Encounter for therapeutic drug level monitoring: Secondary | ICD-10-CM

## 2021-01-13 LAB — POCT INR: INR: 4.2 — AB (ref 2.0–3.0)

## 2021-01-13 NOTE — Patient Instructions (Signed)
Hold warfarin tonight then decrease dose to 1 tablet daily  Recheck in 2 wks in [place]

## 2021-01-14 NOTE — Progress Notes (Signed)
New Breast Cancer Diagnosis: Right Breast- UIQ  Did patient present with symptoms (if so, please note symptoms) or screening mammography?:Screening Calcifications and a possible mass.   Location and Extent of disease :right breast. Located in the upper inner quadrant, measured  2.8 x 1.6 cm.   Histology per Pathology Report: grade Intermediate, DCIS with calcifications 12/03/2020  Receptor Status: ER(positive), PR (negative), Her2-neu (), Ki-(%)  Surgeon and surgical plan, if any: Dr. Georgette Dover -Right Breast Lumpectomy with radioactive seed localization 12/03/2020   Medical oncologist, treatment if any:   Dr. Lindi Adie 12/29/2020 Recommendation: 1. Breast conserving surgery   2. antiestrogen therapy with anastrozole 5 years  We discussed the pros and cons of adjuvant radiation.  Patient decided not to do radiation.  Family History of Breast/Ovarian/Prostate Cancer: Sister had breast cancer, and cancer of female organs, Maternal grandmother had breast.  Lymphedema issues, if any: No    Pain issues, if any: No  SAFETY ISSUES: Prior radiation? No Pacemaker/ICD? No Possible current pregnancy? Hysterectomy Is the patient on methotrexate? No  Current Complaints / other details:

## 2021-01-15 ENCOUNTER — Ambulatory Visit
Admission: RE | Admit: 2021-01-15 | Discharge: 2021-01-15 | Disposition: A | Discharge status: Home / Self Care | Payer: Medicare Other | Source: Ambulatory Visit | Attending: Radiation Oncology | Admitting: Radiation Oncology

## 2021-01-15 ENCOUNTER — Other Ambulatory Visit: Payer: Self-pay

## 2021-01-15 ENCOUNTER — Encounter: Payer: Self-pay | Admitting: Radiation Oncology

## 2021-01-15 VITALS — BP 141/62 | HR 71 | Temp 97.3°F | Resp 20 | Wt 175.4 lb

## 2021-01-15 DIAGNOSIS — E782 Mixed hyperlipidemia: Secondary | ICD-10-CM | POA: Diagnosis not present

## 2021-01-15 DIAGNOSIS — Z800 Family history of malignant neoplasm of digestive organs: Secondary | ICD-10-CM | POA: Insufficient documentation

## 2021-01-15 DIAGNOSIS — E039 Hypothyroidism, unspecified: Secondary | ICD-10-CM | POA: Insufficient documentation

## 2021-01-15 DIAGNOSIS — Z79899 Other long term (current) drug therapy: Secondary | ICD-10-CM | POA: Diagnosis not present

## 2021-01-15 DIAGNOSIS — Z510 Encounter for antineoplastic radiation therapy: Secondary | ICD-10-CM | POA: Insufficient documentation

## 2021-01-15 DIAGNOSIS — Z87891 Personal history of nicotine dependence: Secondary | ICD-10-CM | POA: Diagnosis not present

## 2021-01-15 DIAGNOSIS — I130 Hypertensive heart and chronic kidney disease with heart failure and stage 1 through stage 4 chronic kidney disease, or unspecified chronic kidney disease: Secondary | ICD-10-CM | POA: Insufficient documentation

## 2021-01-15 DIAGNOSIS — D0511 Intraductal carcinoma in situ of right breast: Secondary | ICD-10-CM

## 2021-01-15 DIAGNOSIS — N189 Chronic kidney disease, unspecified: Secondary | ICD-10-CM | POA: Insufficient documentation

## 2021-01-15 DIAGNOSIS — I5032 Chronic diastolic (congestive) heart failure: Secondary | ICD-10-CM | POA: Diagnosis not present

## 2021-01-15 DIAGNOSIS — K219 Gastro-esophageal reflux disease without esophagitis: Secondary | ICD-10-CM | POA: Insufficient documentation

## 2021-01-15 DIAGNOSIS — Z86718 Personal history of other venous thrombosis and embolism: Secondary | ICD-10-CM | POA: Diagnosis not present

## 2021-01-15 DIAGNOSIS — Z87442 Personal history of urinary calculi: Secondary | ICD-10-CM | POA: Diagnosis not present

## 2021-01-15 DIAGNOSIS — Z170 Estrogen receptor positive status [ER+]: Secondary | ICD-10-CM | POA: Diagnosis not present

## 2021-01-15 DIAGNOSIS — E1122 Type 2 diabetes mellitus with diabetic chronic kidney disease: Secondary | ICD-10-CM | POA: Diagnosis not present

## 2021-01-15 DIAGNOSIS — Z809 Family history of malignant neoplasm, unspecified: Secondary | ICD-10-CM | POA: Insufficient documentation

## 2021-01-15 NOTE — Progress Notes (Signed)
Radiation Oncology         [phone] ________________________________  Name: [name]        MRN: [id number]  Date of Service: 01/15/2021 DOB: 1941-03-24  [id number], Jenny Reichmann, MD  Donnie Mesa, MD     REFERRING PHYSICIAN: Donnie Mesa, MD   DIAGNOSIS: The encounter diagnosis was Ductal carcinoma in situ (DCIS) of right breast.   HISTORY OF PRESENT ILLNESS: [name] is a 80 y.o. female seen for a new diagnosis of right breast cancer. The patient was noted to have screening detected calcifications and a possible mass and further diagnostic work-up aocated a mass in the 4:00 retroareolar position measuring 7 mm as well as a group of linear and fine pleomorphic calcifications in the upper inner quadrant measuring 2.8 cm.  The axilla was not described in the report, she underwent a biopsy on 10/09/2020 which revealed focal atypical ductal hyperplasia with vascular calcifications she was encouraged to proceed with lumpectomy for definitive tissue confirmation on resection which was performed on 12/03/2020 her final pathology revealed intermediate grade DCIS with calcifications measuring up to 2 cm her margins were negative but 1 mm to the medial margin.  She is seen today to discuss options of adjuvant therapy.  Her cancer was ER/PR positive.  She has met with Dr. Lindi Adie and may not receive adjuvant antiestrogen therapy due to her history of blood clots.    PREVIOUS RADIATION THERAPY: No   PAST MEDICAL HISTORY:  Past Medical History:  Diagnosis Date   Antral gastritis    EGD 11/15   Asthmatic bronchitis    Back pain    Chronic diastolic heart failure ([place]) 05/20/2015   Grade 2 diastolic dysfunction.  04/2015.   Chronic kidney disease    kidney function low   Diabetes mellitus    x 5 yrs   DVT of axillary vein, acute left (HCC) 07/24/12   GERD (gastroesophageal reflux disease)    Glaucoma    POAG OU   Heart murmur    History of hiatal hernia    History of kidney stones     Hyperlipidemia 05/20/2015   Hypertension    Hypertensive retinopathy    OU   Hypothyroidism    Kidney stones    Macular degeneration    Wet OD, Dry OS   Mixed hyperlipidemia    Peripheral venous insufficiency    Pinched nerve    right elbow   Pneumonia    Sigmoid diverticulitis    Vertigo    chonic       PAST SURGICAL HISTORY: Past Surgical History:  Procedure Laterality Date   ABDOMINAL HYSTERECTOMY     BACK SURGERY     spinal    BREAST LUMPECTOMY WITH RADIOACTIVE SEED LOCALIZATION Right 12/03/2020   Procedure: RIGHT BREAST LUMPECTOMY WITH RADIOACTIVE SEED LOCALIZATION;  Surgeon: Donnie Mesa, MD;  Location: [facility];  Service: General;  Laterality: Right;   CARDIAC CATHETERIZATION N/A 05/26/2015   Procedure: Left Heart Cath and Coronary Angiography;  Surgeon: Jettie Booze, MD;  Location: [facility];  Service: Cardiovascular;  Laterality: N/A;   CATARACT EXTRACTION Bilateral    CHOLECYSTECTOMY     COLON SURGERY     COLONOSCOPY N/A 12/27/2013   Procedure: COLONOSCOPY;  Surgeon: Rogene Houston, MD;  [facility];  Service: Endoscopy;  Laterality: N/A;  200   COLOSTOMY CLOSURE     ESOPHAGOGASTRODUODENOSCOPY N/A 05/07/2014   Procedure: ESOPHAGOGASTRODUODENOSCOPY (EGD);  Surgeon: Rogene Houston, MD;  [facility];  Service: Endoscopy;  Laterality: N/A;   EYE SURGERY Bilateral    Cat Sx   fracture left foot     HERNIA REPAIR     NM MYOCAR PERF WALL MOTION  01/28/2009   Normal   OTHER SURGICAL HISTORY     colostomy, colostomy reversal, for diverticulitis surgical hernia repair, arm surgery, neck surgery   US ECHOCARDIOGRAPHY  02/11/2010   Mild MR,trace TR & AI     FAMILY HISTORY:  Family History  Problem Relation Age of Onset   Other Mother 37       Cause unknown   Cancer Mother        liver   CVA Maternal Grandmother 90       deceased   Heart disease Maternal Grandmother    Stroke Maternal Grandmother    Heart attack Brother  30       deceased   Cancer Sister 61       deceased   Glaucoma Maternal Uncle      SOCIAL HISTORY:  reports that she quit smoking about 7 years ago. Her smoking use included cigarettes. She has never used smokeless tobacco. She reports that she does not drink alcohol and does not use drugs. The patient is widowed and lives in [place]. Her husband passed away from Covid not long ago. She is active in her local church.    ALLERGIES: Diflunisal, Vioxx [rofecoxib], Metformin and related, Nexlizet [bempedoic acid-ezetimibe], Pravastatin, Repatha [evolocumab], Codeine, Elemental sulfur, Motrin [ibuprofen], and Penicillins   MEDICATIONS:  Current Outpatient Medications  Medication Sig Dispense Refill   albuterol (PROVENTIL HFA;VENTOLIN HFA) 108 (90 BASE) MCG/ACT inhaler Inhale 2 puffs into the lungs every 4 (four) hours as needed for shortness of breath. 1 Inhaler 0   amitriptyline (ELAVIL) 25 MG tablet Take 25 mg by mouth at bedtime.     anastrozole (ARIMIDEX) 1 MG tablet Take 1 tablet (1 mg total) by mouth daily. 90 tablet 3   Black Cohosh 40 MG CAPS Take 40 mg by mouth 2 (two) times daily.     brimonidine (ALPHAGAN) 0.2 % ophthalmic solution Place 1 drop into the right eye 2 (two) times daily. 15 mL 6   carvedilol (COREG) 6.25 MG tablet Take 1 tablet (6.25 mg total) by mouth 2 (two) times daily. 180 tablet 1   cetirizine (ZYRTEC) 10 MG tablet Take 10 mg by mouth daily.     Cholecalciferol (VITAMIN D-3) 1000 units CAPS Take 1,000 Units by mouth daily.     diazepam (VALIUM) 2 MG tablet Take 2 mg by mouth 2 (two) times daily as needed for anxiety (dizziness).     dorzolamide-timolol (COSOPT) 22.3-6.8 MG/ML ophthalmic solution INSTILL 1 DROP INTO RIGHT EYE TWICE A DAY (Patient taking differently: Place 1 drop into the right eye 2 (two) times daily.) 30 mL 2   enoxaparin (LOVENOX) 120 MG/0.8ML injection Inject 0.8 mLs (120 mg total) into the skin daily. 2.4 mL 1   esomeprazole (NEXIUM) 20 MG  capsule Take 20 mg by mouth daily at 12 noon.     FARXIGA 5 MG TABS tablet Take 5 mg by mouth every morning.     furosemide (LASIX) 40 MG tablet TAKE 1 TABLET (40 MG TOTAL) BY MOUTH DAILY. MAY TAKE EXTRA DAILY AS NEEDED FOR SWELLING (Patient taking differently: Take 40 mg by mouth 2 (two) times daily as needed for edema.) 180 tablet 1   gabapentin (NEURONTIN) 300 MG capsule Take 300 mg by  mouth 3 (three) times daily.     glimepiride (AMARYL) 2 MG tablet Take 2 mg by mouth daily.  2   hydrALAZINE (APRESOLINE) 25 MG tablet Take 1 tablet (25 mg total) by mouth in the morning and at bedtime. 180 tablet 3   isosorbide mononitrate (IMDUR) 30 MG 24 hr tablet Take 1 tablet (30 mg total) by mouth daily. 90 tablet 3   latanoprost (XALATAN) 0.005 % ophthalmic solution Place 1 drop into both eyes at bedtime.      meclizine (ANTIVERT) 25 MG tablet Take 25 mg by mouth 2 (two) times daily as needed for dizziness.     Multiple Vitamins-Minerals (PRESERVISION AREDS 2 PO) Take 1 capsule by mouth in the morning and at bedtime.     Omega-3 Fatty Acids (FISH OIL) 1200 MG CAPS Take 1,200 mg by mouth 2 (two) times daily.     polyethylene glycol (MIRALAX / GLYCOLAX) packet Take 17 g by mouth daily.     potassium chloride SA (KLOR-CON) 20 MEQ tablet Take 0.5 tablets (10 mEq total) by mouth daily. 45 tablet 9   tetrahydrozoline 0.05 % ophthalmic solution Place 1 drop into both eyes at bedtime.     TRADJENTA 5 MG TABS tablet Take 5 mg by mouth daily.     vitamin B-12 (CYANOCOBALAMIN) 100 MCG tablet Take 100 mcg by mouth daily.     warfarin (COUMADIN) 2 MG tablet Take 1 tablet daily except 1 1/2 tablets on Mondays and Thursdays or as directed 100 tablet 1   No current facility-administered medications for this encounter.     REVIEW OF SYSTEMS: On review of systems, the patient reports that she is doing well overall. She reports she is healing well but did have some swelling in her breast after overdoing her physical  activity but this has improved. No other complaints are verbalized.      PHYSICAL EXAM:  Wt Readings from Last 3 Encounters:  12/29/20 177 lb (80.3 kg)  12/03/20 146 lb 9.7 oz (66.5 kg)  11/25/20 146 lb 11.2 oz (66.5 kg)   Temp Readings from Last 3 Encounters:  12/29/20 97.8 F (36.6 C) (Temporal)  12/03/20 (!) 97.3 F (36.3 C)  11/25/20 97.6 F (36.4 C) (Oral)   BP Readings from Last 3 Encounters:  12/29/20 135/76  12/03/20 (!) 102/54  11/25/20 131/68   Pulse Readings from Last 3 Encounters:  12/29/20 77  12/03/20 71  11/25/20 64    In general this is a well appearing Caucasian female in no acute distress. She's alert and oriented x4 and appropriate throughout the examination. Cardiopulmonary assessment is negative for acute distress and she exhibits normal effort.  The right breast has a well-healed incision site without separation bleeding or drainage.   ECOG = 0  0 - Asymptomatic (Fully active, able to carry on all predisease activities without restriction)  1 - Symptomatic but completely ambulatory (Restricted in physically strenuous activity but ambulatory and able to carry out work of a light or sedentary nature. For example, light housework, office work)  2 - Symptomatic, <50% in bed during the day (Ambulatory and capable of all self care but unable to carry out any work activities. Up and about more than 50% of waking hours)  3 - Symptomatic, >50% in bed, but not bedbound (Capable of only limited self-care, confined to bed or chair 50% or more of waking hours)  4 - Bedbound (Completely disabled. Cannot carry on any self-care. Totally confined to bed or chair)  5 - Death   Eustace Pen MM, Creech RH, Tormey DC, et al. [phone]). "Toxicity and response criteria of the [facility]". [place] Oncol. 5 (6): 649-55    LABORATORY DATA:  Lab Results  Component Value Date   WBC 6.8 11/25/2020   HGB 14.0 11/25/2020   HCT 43.4 11/25/2020   MCV  92.3 11/25/2020   PLT 247 11/25/2020   Lab Results  Component Value Date   NA 141 11/25/2020   K 3.9 11/25/2020   CL 106 11/25/2020   CO2 27 11/25/2020   Lab Results  Component Value Date   ALT 26 10/05/2019   AST 20 10/05/2019   ALKPHOS 139 (H) 10/05/2019   BILITOT 0.4 10/05/2019      RADIOGRAPHY: Intravitreal Injection, Pharmacologic Agent - OD - Right Eye  Result Date: 12/22/2020 Time Out 12/22/2020. 2:58 PM. Confirmed correct patient, procedure, site, and patient consented. Anesthesia Topical anesthesia was used. Anesthetic medications included Lidocaine 2%, Proparacaine 0.5%. Procedure Preparation included 5% betadine to ocular surface, eyelid speculum. A supplied (32g) needle was used. Injection: 1.25 mg Bevacizumab 1.11m/0.05ml   Route: Intravitreal, Site: Right Eye   NDC: 50242-060-01, Lot:: [id number] Expiration date: 02/13/2021, Waste: 0.05 mL Post-op Post injection exam found visual acuity of at least counting fingers. The patient tolerated the procedure well. There were no complications. The patient received written and verbal post procedure care education. Post injection medications were not given. Notes  OCT, Retina - OU - Both Eyes  Result Date: 12/22/2020 Right Eye Quality was good. Central Foveal Thickness: 196. Progression has worsened. Findings include intraretinal hyper-reflective material, pigment epithelial detachment, subretinal hyper-reflective material, retinal drusen , normal foveal contour, outer retinal atrophy, no IRF, subretinal fluid (Mild interval increase in SRF overlying nasal PED, partial PVD). Left Eye Quality was good. Central Foveal Thickness: 211. Progression has been stable. Findings include normal foveal contour, no IRF, no SRF, retinal drusen . Notes *Images captured and stored on drive Diagnosis / Impression: OD: exudative ARMD; Mild interval increase in SRD overlying nasal PED, partial PVD OS: NFP, no IRF/SRF; +drusen -- nonexudative ARMD Clinical  management: See below Abbreviations: NFP - Normal foveal profile. CME - cystoid macular edema. PED - pigment epithelial detachment. IRF - intraretinal fluid. SRF - subretinal fluid. EZ - ellipsoid zone. ERM - epiretinal membrane. ORA - outer retinal atrophy. ORT - outer retinal tubulation. SRHM - subretinal hyper-reflective material       IMPRESSION/PLAN: 1. Intermediate Grade, ER/PR positive DCIS of the right breast. Dr. MLisbeth Renshawdiscusses the pathology findings and reviews the nature of noninvasive breast disease. The consensus from the breast conference includes with the patient's close margin and inability to tolerate antiestrogen therapy, Dr. MGlendora Scoredoes recommend external radiotherapy to the breast  to reduce risks of local recurrence followed by antiestrogen therapy. We discussed the risks, benefits, short, and long term effects of radiotherapy, as well as the curative intent, and the patient is interested in proceeding. Dr. MLisbeth Renshawdiscusses the delivery and logistics of radiotherapy and anticipates a course of 4 weeks of radiotherapy. Written consent is obtained and placed in the chart, a copy was provided to the patient. She will simulate same day this afternoon.   In a visit lasting 60 minutes, greater than 50% of the time was spent face to face reviewing her case, as well as in preparation of, discussing, and coordinating the patient's care.  The above documentation reflects my direct findings during this shared patient visit. Please see the  separate note by Dr. Lisbeth Renshaw on this date for the remainder of the patient's plan of care.    Carola Rhine, [facility]    **Disclaimer: This note was dictated with voice recognition software. Similar sounding words can inadvertently be transcribed and this note may contain transcription errors which may not have been corrected upon publication of note.**

## 2021-01-19 DIAGNOSIS — Z510 Encounter for antineoplastic radiation therapy: Secondary | ICD-10-CM | POA: Diagnosis not present

## 2021-01-19 DIAGNOSIS — Z170 Estrogen receptor positive status [ER+]: Secondary | ICD-10-CM | POA: Diagnosis not present

## 2021-01-19 DIAGNOSIS — D0511 Intraductal carcinoma in situ of right breast: Secondary | ICD-10-CM | POA: Diagnosis not present

## 2021-01-22 ENCOUNTER — Ambulatory Visit
Admission: RE | Admit: 2021-01-22 | Discharge: 2021-01-22 | Disposition: A | Discharge status: Home / Self Care | Payer: Medicare Other | Source: Ambulatory Visit | Attending: Radiation Oncology | Admitting: Radiation Oncology

## 2021-01-22 ENCOUNTER — Encounter: Payer: Self-pay | Admitting: Radiation Oncology

## 2021-01-22 DIAGNOSIS — Z510 Encounter for antineoplastic radiation therapy: Secondary | ICD-10-CM | POA: Diagnosis not present

## 2021-01-22 DIAGNOSIS — D0511 Intraductal carcinoma in situ of right breast: Secondary | ICD-10-CM | POA: Diagnosis not present

## 2021-01-22 DIAGNOSIS — Z170 Estrogen receptor positive status [ER+]: Secondary | ICD-10-CM | POA: Diagnosis not present

## 2021-01-22 NOTE — Progress Notes (Signed)
Pt here for patient teaching.  Pt given Radiation and You booklet, skin care instructions, Alra deodorant, and Radiaplex gel.  Reviewed areas of pertinence such as fatigue, hair loss, skin changes, breast tenderness, and breast swelling . Pt able to give teach back of to pat skin and use unscented/gentle soap,apply Radiaplex bid, avoid applying anything to skin within 4 hours of treatment, avoid wearing an under wire bra, and to use an electric razor if they must shave. Pt verbalizes understanding of information given and will contact nursing with any questions or concerns.     Http://rtanswers.org/treatmentinformation/whattoexpect/index  Drea Jurewicz M. Amandamarie Feggins RN, BSN       

## 2021-01-23 ENCOUNTER — Ambulatory Visit
Admission: RE | Admit: 2021-01-23 | Discharge: 2021-01-23 | Disposition: A | Discharge status: Home / Self Care | Payer: Medicare Other | Source: Ambulatory Visit | Attending: Radiation Oncology | Admitting: Radiation Oncology

## 2021-01-23 DIAGNOSIS — D0511 Intraductal carcinoma in situ of right breast: Secondary | ICD-10-CM

## 2021-01-23 DIAGNOSIS — Z170 Estrogen receptor positive status [ER+]: Secondary | ICD-10-CM | POA: Diagnosis not present

## 2021-01-23 DIAGNOSIS — Z510 Encounter for antineoplastic radiation therapy: Secondary | ICD-10-CM | POA: Diagnosis not present

## 2021-01-23 MED ORDER — ALRA NON-METALLIC DEODORANT (RAD-ONC)
1.0000 | Freq: Once | TOPICAL | Status: AC
Start: 2021-01-23 — End: 2021-01-23
  Administered 2021-01-23: 1 via TOPICAL

## 2021-01-23 MED ORDER — RADIAPLEXRX EX GEL: Freq: Once | CUTANEOUS | Status: AC

## 2021-01-26 ENCOUNTER — Ambulatory Visit
Admission: RE | Admit: 2021-01-26 | Discharge: 2021-01-26 | Disposition: A | Discharge status: Home / Self Care | Payer: Medicare Other | Source: Ambulatory Visit | Attending: Radiation Oncology | Admitting: Radiation Oncology

## 2021-01-26 ENCOUNTER — Other Ambulatory Visit: Payer: Self-pay

## 2021-01-26 DIAGNOSIS — Z170 Estrogen receptor positive status [ER+]: Secondary | ICD-10-CM | POA: Diagnosis not present

## 2021-01-26 DIAGNOSIS — Z510 Encounter for antineoplastic radiation therapy: Secondary | ICD-10-CM | POA: Diagnosis not present

## 2021-01-26 DIAGNOSIS — D0511 Intraductal carcinoma in situ of right breast: Secondary | ICD-10-CM | POA: Diagnosis not present

## 2021-01-27 ENCOUNTER — Other Ambulatory Visit: Payer: Self-pay

## 2021-01-27 ENCOUNTER — Ambulatory Visit
Admission: RE | Admit: 2021-01-27 | Discharge: 2021-01-27 | Disposition: A | Discharge status: Home / Self Care | Payer: Medicare Other | Source: Ambulatory Visit | Attending: Radiation Oncology | Admitting: Radiation Oncology

## 2021-01-27 DIAGNOSIS — Z170 Estrogen receptor positive status [ER+]: Secondary | ICD-10-CM | POA: Diagnosis not present

## 2021-01-27 DIAGNOSIS — Z510 Encounter for antineoplastic radiation therapy: Secondary | ICD-10-CM | POA: Diagnosis not present

## 2021-01-27 DIAGNOSIS — D0511 Intraductal carcinoma in situ of right breast: Secondary | ICD-10-CM | POA: Diagnosis not present

## 2021-01-28 ENCOUNTER — Ambulatory Visit: Admission: RE | Admit: 2021-01-28 | Payer: Medicare Other | Source: Ambulatory Visit

## 2021-01-28 DIAGNOSIS — Z510 Encounter for antineoplastic radiation therapy: Secondary | ICD-10-CM | POA: Diagnosis not present

## 2021-01-28 DIAGNOSIS — Z170 Estrogen receptor positive status [ER+]: Secondary | ICD-10-CM | POA: Diagnosis not present

## 2021-01-28 DIAGNOSIS — D0511 Intraductal carcinoma in situ of right breast: Secondary | ICD-10-CM | POA: Diagnosis not present

## 2021-01-29 ENCOUNTER — Ambulatory Visit
Admission: RE | Admit: 2021-01-29 | Discharge: 2021-01-29 | Disposition: A | Discharge status: Home / Self Care | Payer: Medicare Other | Source: Ambulatory Visit | Attending: Radiation Oncology | Admitting: Radiation Oncology

## 2021-01-29 ENCOUNTER — Other Ambulatory Visit: Payer: Self-pay

## 2021-01-29 DIAGNOSIS — Z170 Estrogen receptor positive status [ER+]: Secondary | ICD-10-CM | POA: Diagnosis not present

## 2021-01-29 DIAGNOSIS — Z510 Encounter for antineoplastic radiation therapy: Secondary | ICD-10-CM | POA: Diagnosis not present

## 2021-01-29 DIAGNOSIS — D0511 Intraductal carcinoma in situ of right breast: Secondary | ICD-10-CM | POA: Diagnosis not present

## 2021-01-30 ENCOUNTER — Ambulatory Visit
Admission: RE | Admit: 2021-01-30 | Discharge: 2021-01-30 | Disposition: A | Discharge status: Home / Self Care | Payer: Medicare Other | Source: Ambulatory Visit | Attending: Radiation Oncology | Admitting: Radiation Oncology

## 2021-01-30 DIAGNOSIS — D0511 Intraductal carcinoma in situ of right breast: Secondary | ICD-10-CM | POA: Diagnosis not present

## 2021-01-30 DIAGNOSIS — Z510 Encounter for antineoplastic radiation therapy: Secondary | ICD-10-CM | POA: Diagnosis not present

## 2021-01-30 DIAGNOSIS — Z170 Estrogen receptor positive status [ER+]: Secondary | ICD-10-CM | POA: Diagnosis not present

## 2021-02-01 DIAGNOSIS — I2510 Atherosclerotic heart disease of native coronary artery without angina pectoris: Secondary | ICD-10-CM | POA: Diagnosis not present

## 2021-02-01 DIAGNOSIS — E1165 Type 2 diabetes mellitus with hyperglycemia: Secondary | ICD-10-CM | POA: Diagnosis not present

## 2021-02-01 DIAGNOSIS — I10 Essential (primary) hypertension: Secondary | ICD-10-CM | POA: Diagnosis not present

## 2021-02-02 ENCOUNTER — Ambulatory Visit: Payer: Medicare Other | Admitting: *Deleted

## 2021-02-02 ENCOUNTER — Other Ambulatory Visit: Payer: Self-pay

## 2021-02-02 ENCOUNTER — Ambulatory Visit
Admission: RE | Admit: 2021-02-02 | Discharge: 2021-02-02 | Disposition: A | Discharge status: Home / Self Care | Payer: Medicare Other | Source: Ambulatory Visit | Attending: Radiation Oncology | Admitting: Radiation Oncology

## 2021-02-02 DIAGNOSIS — I82A12 Acute embolism and thrombosis of left axillary vein: Secondary | ICD-10-CM | POA: Diagnosis not present

## 2021-02-02 DIAGNOSIS — Z510 Encounter for antineoplastic radiation therapy: Secondary | ICD-10-CM | POA: Diagnosis not present

## 2021-02-02 DIAGNOSIS — Z5181 Encounter for therapeutic drug level monitoring: Secondary | ICD-10-CM | POA: Diagnosis not present

## 2021-02-02 DIAGNOSIS — D0511 Intraductal carcinoma in situ of right breast: Secondary | ICD-10-CM | POA: Insufficient documentation

## 2021-02-02 DIAGNOSIS — Z170 Estrogen receptor positive status [ER+]: Secondary | ICD-10-CM | POA: Insufficient documentation

## 2021-02-02 LAB — POCT INR: INR: 2.3 (ref 2.0–3.0)

## 2021-02-02 NOTE — Patient Instructions (Signed)
Description   Continue to take warfarin 1 tablet daily. Recheck INR in 3 weeks. Call Coumadin clinic for any questions or changes in medications.

## 2021-02-03 ENCOUNTER — Ambulatory Visit
Admission: RE | Admit: 2021-02-03 | Discharge: 2021-02-03 | Disposition: A | Discharge status: Home / Self Care | Payer: Medicare Other | Source: Ambulatory Visit | Attending: Radiation Oncology | Admitting: Radiation Oncology

## 2021-02-03 DIAGNOSIS — Z170 Estrogen receptor positive status [ER+]: Secondary | ICD-10-CM | POA: Diagnosis not present

## 2021-02-03 DIAGNOSIS — D0511 Intraductal carcinoma in situ of right breast: Secondary | ICD-10-CM | POA: Diagnosis not present

## 2021-02-03 DIAGNOSIS — Z510 Encounter for antineoplastic radiation therapy: Secondary | ICD-10-CM | POA: Diagnosis not present

## 2021-02-03 MED ORDER — RADIAPLEXRX EX GEL
Freq: Once | CUTANEOUS | Status: AC
Start: 2021-02-03 — End: 2021-02-03

## 2021-02-04 ENCOUNTER — Ambulatory Visit
Admission: RE | Admit: 2021-02-04 | Discharge: 2021-02-04 | Disposition: A | Discharge status: Home / Self Care | Payer: Medicare Other | Source: Ambulatory Visit | Attending: Radiation Oncology | Admitting: Radiation Oncology

## 2021-02-04 ENCOUNTER — Other Ambulatory Visit: Payer: Self-pay

## 2021-02-04 DIAGNOSIS — Z1389 Encounter for screening for other disorder: Secondary | ICD-10-CM | POA: Diagnosis not present

## 2021-02-04 DIAGNOSIS — Z510 Encounter for antineoplastic radiation therapy: Secondary | ICD-10-CM | POA: Diagnosis not present

## 2021-02-04 DIAGNOSIS — E559 Vitamin D deficiency, unspecified: Secondary | ICD-10-CM | POA: Diagnosis not present

## 2021-02-04 DIAGNOSIS — D0511 Intraductal carcinoma in situ of right breast: Secondary | ICD-10-CM | POA: Diagnosis not present

## 2021-02-04 DIAGNOSIS — Z170 Estrogen receptor positive status [ER+]: Secondary | ICD-10-CM | POA: Diagnosis not present

## 2021-02-04 DIAGNOSIS — E7849 Other hyperlipidemia: Secondary | ICD-10-CM | POA: Diagnosis not present

## 2021-02-04 DIAGNOSIS — Z0001 Encounter for general adult medical examination with abnormal findings: Secondary | ICD-10-CM | POA: Diagnosis not present

## 2021-02-05 ENCOUNTER — Ambulatory Visit
Admission: RE | Admit: 2021-02-05 | Discharge: 2021-02-05 | Disposition: A | Discharge status: Home / Self Care | Payer: Medicare Other | Source: Ambulatory Visit | Attending: Radiation Oncology | Admitting: Radiation Oncology

## 2021-02-05 DIAGNOSIS — Z510 Encounter for antineoplastic radiation therapy: Secondary | ICD-10-CM | POA: Diagnosis not present

## 2021-02-05 DIAGNOSIS — Z170 Estrogen receptor positive status [ER+]: Secondary | ICD-10-CM | POA: Diagnosis not present

## 2021-02-05 DIAGNOSIS — D0511 Intraductal carcinoma in situ of right breast: Secondary | ICD-10-CM | POA: Diagnosis not present

## 2021-02-06 ENCOUNTER — Ambulatory Visit: Payer: Medicare Other | Admitting: Radiation Oncology

## 2021-02-06 ENCOUNTER — Ambulatory Visit
Admission: RE | Admit: 2021-02-06 | Discharge: 2021-02-06 | Disposition: A | Discharge status: Home / Self Care | Payer: Medicare Other | Source: Ambulatory Visit | Attending: Radiation Oncology | Admitting: Radiation Oncology

## 2021-02-06 DIAGNOSIS — D0511 Intraductal carcinoma in situ of right breast: Secondary | ICD-10-CM | POA: Diagnosis not present

## 2021-02-06 DIAGNOSIS — Z510 Encounter for antineoplastic radiation therapy: Secondary | ICD-10-CM | POA: Diagnosis not present

## 2021-02-06 DIAGNOSIS — Z170 Estrogen receptor positive status [ER+]: Secondary | ICD-10-CM | POA: Diagnosis not present

## 2021-02-09 ENCOUNTER — Other Ambulatory Visit: Payer: Self-pay

## 2021-02-09 ENCOUNTER — Ambulatory Visit
Admission: RE | Admit: 2021-02-09 | Discharge: 2021-02-09 | Disposition: A | Discharge status: Home / Self Care | Payer: Medicare Other | Source: Ambulatory Visit | Attending: Radiation Oncology | Admitting: Radiation Oncology

## 2021-02-09 DIAGNOSIS — D0511 Intraductal carcinoma in situ of right breast: Secondary | ICD-10-CM | POA: Diagnosis not present

## 2021-02-09 DIAGNOSIS — Z170 Estrogen receptor positive status [ER+]: Secondary | ICD-10-CM | POA: Diagnosis not present

## 2021-02-09 DIAGNOSIS — Z510 Encounter for antineoplastic radiation therapy: Secondary | ICD-10-CM | POA: Diagnosis not present

## 2021-02-10 ENCOUNTER — Ambulatory Visit
Admission: RE | Admit: 2021-02-10 | Discharge: 2021-02-10 | Disposition: A | Discharge status: Home / Self Care | Payer: Medicare Other | Source: Ambulatory Visit | Attending: Radiation Oncology | Admitting: Radiation Oncology

## 2021-02-10 DIAGNOSIS — D0511 Intraductal carcinoma in situ of right breast: Secondary | ICD-10-CM | POA: Diagnosis not present

## 2021-02-10 DIAGNOSIS — Z170 Estrogen receptor positive status [ER+]: Secondary | ICD-10-CM | POA: Diagnosis not present

## 2021-02-10 DIAGNOSIS — Z510 Encounter for antineoplastic radiation therapy: Secondary | ICD-10-CM | POA: Diagnosis not present

## 2021-02-11 ENCOUNTER — Ambulatory Visit
Admission: RE | Admit: 2021-02-11 | Discharge: 2021-02-11 | Disposition: A | Discharge status: Home / Self Care | Payer: Medicare Other | Source: Ambulatory Visit | Attending: Radiation Oncology | Admitting: Radiation Oncology

## 2021-02-11 ENCOUNTER — Other Ambulatory Visit: Payer: Self-pay

## 2021-02-11 ENCOUNTER — Other Ambulatory Visit: Payer: Self-pay | Admitting: Adult Health

## 2021-02-11 DIAGNOSIS — D0511 Intraductal carcinoma in situ of right breast: Secondary | ICD-10-CM

## 2021-02-11 DIAGNOSIS — Z510 Encounter for antineoplastic radiation therapy: Secondary | ICD-10-CM | POA: Diagnosis not present

## 2021-02-11 DIAGNOSIS — Z170 Estrogen receptor positive status [ER+]: Secondary | ICD-10-CM | POA: Diagnosis not present

## 2021-02-12 ENCOUNTER — Ambulatory Visit
Admission: RE | Admit: 2021-02-12 | Discharge: 2021-02-12 | Disposition: A | Discharge status: Home / Self Care | Payer: Medicare Other | Source: Ambulatory Visit | Attending: Radiation Oncology | Admitting: Radiation Oncology

## 2021-02-12 DIAGNOSIS — D0511 Intraductal carcinoma in situ of right breast: Secondary | ICD-10-CM | POA: Diagnosis not present

## 2021-02-12 DIAGNOSIS — Z170 Estrogen receptor positive status [ER+]: Secondary | ICD-10-CM | POA: Diagnosis not present

## 2021-02-12 DIAGNOSIS — Z510 Encounter for antineoplastic radiation therapy: Secondary | ICD-10-CM | POA: Diagnosis not present

## 2021-02-13 ENCOUNTER — Ambulatory Visit
Admission: RE | Admit: 2021-02-13 | Discharge: 2021-02-13 | Disposition: A | Discharge status: Home / Self Care | Payer: Medicare Other | Source: Ambulatory Visit | Attending: Radiation Oncology | Admitting: Radiation Oncology

## 2021-02-13 ENCOUNTER — Other Ambulatory Visit: Payer: Self-pay

## 2021-02-13 DIAGNOSIS — D0511 Intraductal carcinoma in situ of right breast: Secondary | ICD-10-CM | POA: Diagnosis not present

## 2021-02-13 DIAGNOSIS — Z510 Encounter for antineoplastic radiation therapy: Secondary | ICD-10-CM | POA: Diagnosis not present

## 2021-02-13 DIAGNOSIS — Z170 Estrogen receptor positive status [ER+]: Secondary | ICD-10-CM | POA: Diagnosis not present

## 2021-02-16 ENCOUNTER — Other Ambulatory Visit: Payer: Self-pay

## 2021-02-16 ENCOUNTER — Ambulatory Visit
Admission: RE | Admit: 2021-02-16 | Discharge: 2021-02-16 | Disposition: A | Discharge status: Home / Self Care | Payer: Medicare Other | Source: Ambulatory Visit | Attending: Radiation Oncology | Admitting: Radiation Oncology

## 2021-02-16 DIAGNOSIS — Z170 Estrogen receptor positive status [ER+]: Secondary | ICD-10-CM | POA: Diagnosis not present

## 2021-02-16 DIAGNOSIS — D0511 Intraductal carcinoma in situ of right breast: Secondary | ICD-10-CM | POA: Diagnosis not present

## 2021-02-16 DIAGNOSIS — Z510 Encounter for antineoplastic radiation therapy: Secondary | ICD-10-CM | POA: Diagnosis not present

## 2021-02-17 ENCOUNTER — Ambulatory Visit
Admission: RE | Admit: 2021-02-17 | Discharge: 2021-02-17 | Disposition: A | Discharge status: Home / Self Care | Payer: Medicare Other | Source: Ambulatory Visit | Attending: Radiation Oncology | Admitting: Radiation Oncology

## 2021-02-17 DIAGNOSIS — Z170 Estrogen receptor positive status [ER+]: Secondary | ICD-10-CM | POA: Diagnosis not present

## 2021-02-17 DIAGNOSIS — Z510 Encounter for antineoplastic radiation therapy: Secondary | ICD-10-CM | POA: Diagnosis not present

## 2021-02-17 DIAGNOSIS — D0511 Intraductal carcinoma in situ of right breast: Secondary | ICD-10-CM | POA: Diagnosis not present

## 2021-02-18 ENCOUNTER — Ambulatory Visit
Admission: RE | Admit: 2021-02-18 | Discharge: 2021-02-18 | Disposition: A | Discharge status: Home / Self Care | Payer: Medicare Other | Source: Ambulatory Visit | Attending: Radiation Oncology | Admitting: Radiation Oncology

## 2021-02-18 ENCOUNTER — Ambulatory Visit: Payer: Medicare Other

## 2021-02-18 ENCOUNTER — Other Ambulatory Visit: Payer: Self-pay

## 2021-02-18 DIAGNOSIS — D0511 Intraductal carcinoma in situ of right breast: Secondary | ICD-10-CM | POA: Diagnosis not present

## 2021-02-18 DIAGNOSIS — Z510 Encounter for antineoplastic radiation therapy: Secondary | ICD-10-CM | POA: Diagnosis not present

## 2021-02-18 DIAGNOSIS — Z170 Estrogen receptor positive status [ER+]: Secondary | ICD-10-CM | POA: Diagnosis not present

## 2021-02-19 ENCOUNTER — Encounter: Payer: Self-pay | Admitting: Radiation Oncology

## 2021-02-19 ENCOUNTER — Ambulatory Visit
Admission: RE | Admit: 2021-02-19 | Discharge: 2021-02-19 | Disposition: A | Discharge status: Home / Self Care | Payer: Medicare Other | Source: Ambulatory Visit | Attending: Radiation Oncology | Admitting: Radiation Oncology

## 2021-02-19 DIAGNOSIS — Z510 Encounter for antineoplastic radiation therapy: Secondary | ICD-10-CM | POA: Diagnosis not present

## 2021-02-19 DIAGNOSIS — D0511 Intraductal carcinoma in situ of right breast: Secondary | ICD-10-CM | POA: Diagnosis not present

## 2021-02-19 DIAGNOSIS — Z170 Estrogen receptor positive status [ER+]: Secondary | ICD-10-CM | POA: Diagnosis not present

## 2021-02-23 ENCOUNTER — Ambulatory Visit (INDEPENDENT_AMBULATORY_CARE_PROVIDER_SITE_OTHER): Payer: Medicare Other | Admitting: *Deleted

## 2021-02-23 DIAGNOSIS — Z5181 Encounter for therapeutic drug level monitoring: Secondary | ICD-10-CM | POA: Diagnosis not present

## 2021-02-23 DIAGNOSIS — I82A12 Acute embolism and thrombosis of left axillary vein: Secondary | ICD-10-CM

## 2021-02-23 LAB — POCT INR: INR: 2.9 (ref 2.0–3.0)

## 2021-02-23 NOTE — Progress Notes (Signed)
[facility] Note  02/25/2021     CHIEF COMPLAINT Patient presents for Retina Follow Up   HISTORY OF PRESENT ILLNESS: [name] is a 80 y.o. female who presents to the clinic today for:  HPI     Retina Follow Up   Patient presents with  Wet AMD.  In right eye.  This started 9 weeks ago.  I, the attending physician,  performed the HPI with the patient and updated documentation appropriately.        Comments   Patient here for 9 weeks retina follow up for exu ARMD OD. Patient states vision doing pretty good. Better at times than other times. No eye pain. Eyelashes get in the way. Finished radiation last week. Was put on a pill starts with letter A to be taken for 5 years.       Last edited by Bernarda Caffey, MD on 02/25/2021 12:33 PM.    Patient states vision better at other times than others, but pretty good overall.    Referring physician: Sharilyn Sites, MD [address] [place],  [place] [address]  HISTORICAL INFORMATION:   Selected notes from the MEDICAL RECORD NUMBER Referred by Dr. Madelin Headings for concern of SRF OD LEE: 11.27.20 (M. Cotter) [BCVA: OD: 20/80-- OS: 20/60-]  Ocular Hx-glaucoma (latanoprost)  PMH-DM    CURRENT MEDICATIONS: Current Outpatient Medications (Ophthalmic Drugs)  Medication Sig   dorzolamide-timolol (COSOPT) 22.3-6.8 MG/ML ophthalmic solution INSTILL 1 DROP INTO RIGHT EYE TWICE A DAY (Patient taking differently: Place 1 drop into the right eye 2 (two) times daily.)   latanoprost (XALATAN) 0.005 % ophthalmic solution Place 1 drop into both eyes at bedtime.    tetrahydrozoline 0.05 % ophthalmic solution Place 1 drop into both eyes at bedtime.   No current facility-administered medications for this visit. (Ophthalmic Drugs)   Current Outpatient Medications (Other)  Medication Sig   albuterol (PROVENTIL HFA;VENTOLIN HFA) 108 (90 BASE) MCG/ACT inhaler Inhale 2 puffs into the lungs every 4 (four) hours as  needed for shortness of breath.   amitriptyline (ELAVIL) 25 MG tablet Take 25 mg by mouth at bedtime.   anastrozole (ARIMIDEX) 1 MG tablet Take 1 tablet (1 mg total) by mouth daily.   Black Cohosh 40 MG CAPS Take 40 mg by mouth 2 (two) times daily.   carvedilol (COREG) 6.25 MG tablet Take 1 tablet (6.25 mg total) by mouth 2 (two) times daily.   cetirizine (ZYRTEC) 10 MG tablet Take 10 mg by mouth daily.   Cholecalciferol (VITAMIN D-3) 1000 units CAPS Take 1,000 Units by mouth daily.   diazepam (VALIUM) 2 MG tablet Take 2 mg by mouth 2 (two) times daily as needed for anxiety (dizziness).   enoxaparin (LOVENOX) 120 MG/0.8ML injection Inject 0.8 mLs (120 mg total) into the skin daily.   esomeprazole (NEXIUM) 20 MG capsule Take 20 mg by mouth daily at 12 noon.   FARXIGA 5 MG TABS tablet Take 5 mg by mouth every morning.   furosemide (LASIX) 40 MG tablet TAKE 1 TABLET (40 MG TOTAL) BY MOUTH DAILY. MAY TAKE EXTRA DAILY AS NEEDED FOR SWELLING (Patient taking differently: Take 40 mg by mouth 2 (two) times daily as needed for edema.)   gabapentin (NEURONTIN) 300 MG capsule Take 300 mg by mouth 3 (three) times daily.   glimepiride (AMARYL) 2 MG tablet Take 2 mg by mouth daily.   hydrALAZINE (APRESOLINE) 25 MG tablet Take 1 tablet (25 mg total) by mouth  in the morning and at bedtime.   isosorbide mononitrate (IMDUR) 30 MG 24 hr tablet Take 1 tablet (30 mg total) by mouth daily.   meclizine (ANTIVERT) 25 MG tablet Take 25 mg by mouth 2 (two) times daily as needed for dizziness.   Multiple Vitamins-Minerals (PRESERVISION AREDS 2 PO) Take 1 capsule by mouth in the morning and at bedtime.   Omega-3 Fatty Acids (FISH OIL) 1200 MG CAPS Take 1,200 mg by mouth 2 (two) times daily.   polyethylene glycol (MIRALAX / GLYCOLAX) packet Take 17 g by mouth daily.   potassium chloride SA (KLOR-CON) 20 MEQ tablet Take 0.5 tablets (10 mEq total) by mouth daily.   TRADJENTA 5 MG TABS tablet Take 5 mg by mouth daily.    vitamin B-12 (CYANOCOBALAMIN) 100 MCG tablet Take 100 mcg by mouth daily.   warfarin (COUMADIN) 2 MG tablet Take 1 tablet daily except 1 1/2 tablets on Mondays and Thursdays or as directed   No current facility-administered medications for this visit. (Other)   REVIEW OF SYSTEMS: ROS   Positive for: Genitourinary, Endocrine, Cardiovascular, Eyes Negative for: Constitutional, Gastrointestinal, Neurological, Skin, Musculoskeletal, HENT, Respiratory, Psychiatric, Allergic/Imm, Heme/Lymph Last edited by Theodore Demark, COA on 02/25/2021  9:44 AM.     ALLERGIES Allergies  Allergen Reactions   Alphagan [Brimonidine] Itching   Diflunisal Swelling    Other reaction(s): ENTIRE BODY SWELLING   Vioxx [Rofecoxib] Shortness Of Breath   Metformin And Related     Kidney failure   Nexlizet [Bempedoic Acid-Ezetimibe]     Causes elevated Liver and Kidney function   Pravastatin    Repatha [Evolocumab]     MYALGIAS   Codeine Rash   Elemental Sulfur Rash   Motrin [Ibuprofen] Rash   Penicillins Rash    PAST MEDICAL HISTORY Past Medical History:  Diagnosis Date   Antral gastritis    EGD 11/15   Asthmatic bronchitis    Back pain    Chronic diastolic heart failure ([place]) 05/20/2015   Grade 2 diastolic dysfunction.  04/2015.   Chronic kidney disease    kidney function low   Diabetes mellitus    x 5 yrs   DVT of axillary vein, acute left (HCC) 07/24/12   GERD (gastroesophageal reflux disease)    Glaucoma    POAG OU   Heart murmur    History of hiatal hernia    History of kidney stones    Hyperlipidemia 05/20/2015   Hypertension    Hypertensive retinopathy    OU   Hypothyroidism    Kidney stones    Macular degeneration    Wet OD, Dry OS   Mixed hyperlipidemia    Peripheral venous insufficiency    Pinched nerve    right elbow   Pneumonia    Sigmoid diverticulitis    Vertigo    chonic   Past Surgical History:  Procedure Laterality Date   ABDOMINAL HYSTERECTOMY     BACK  SURGERY     spinal    BREAST LUMPECTOMY WITH RADIOACTIVE SEED LOCALIZATION Right 12/03/2020   Procedure: RIGHT BREAST LUMPECTOMY WITH RADIOACTIVE SEED LOCALIZATION;  Surgeon: Donnie Mesa, MD;  Location: [facility];  Service: General;  Laterality: Right;   CARDIAC CATHETERIZATION N/A 05/26/2015   Procedure: Left Heart Cath and Coronary Angiography;  Surgeon: Jettie Booze, MD;  Location: [facility];  Service: Cardiovascular;  Laterality: N/A;   CATARACT EXTRACTION Bilateral    CHOLECYSTECTOMY     COLON SURGERY  COLONOSCOPY N/A 12/27/2013   Procedure: COLONOSCOPY;  Surgeon: Rogene Houston, MD;  [facility];  Service: Endoscopy;  Laterality: N/A;  200   COLOSTOMY CLOSURE     ESOPHAGOGASTRODUODENOSCOPY N/A 05/07/2014   Procedure: ESOPHAGOGASTRODUODENOSCOPY (EGD);  Surgeon: Rogene Houston, MD;  [facility];  Service: Endoscopy;  Laterality: N/A;   EYE SURGERY Bilateral    Cat Sx   fracture left foot     HERNIA REPAIR     NM MYOCAR PERF WALL MOTION  01/28/2009   Normal   OTHER SURGICAL HISTORY     colostomy, colostomy reversal, for diverticulitis surgical hernia repair, arm surgery, neck surgery   US ECHOCARDIOGRAPHY  02/11/2010   Mild MR,trace TR & AI    FAMILY HISTORY Family History  Problem Relation Age of Onset   Other Mother 36       Cause unknown   Cancer Mother        liver   Heart attack Brother 40       deceased   CVA Maternal Grandmother 90       deceased   Heart disease Maternal Grandmother    Stroke Maternal Grandmother    Breast cancer Maternal Grandmother    Glaucoma Maternal Uncle    Bone cancer Maternal Uncle    Leukemia Maternal Aunt    Breast cancer Sister     SOCIAL HISTORY Social History   Tobacco Use   Smoking status: Former    Types: Cigarettes    Quit date: 12/10/2013    Years since quitting: 7.2   Smokeless tobacco: Never   Tobacco comments:    Smoke 1-1 1/2 packs a day  Vaping Use   Vaping Use: Never used   Substance Use Topics   Alcohol use: No   Drug use: No         OPHTHALMIC EXAM:  Base Eye Exam     Visual Acuity (Snellen - Linear)       Right Left   Dist [place] 20/25 20/30   Dist ph [place] NI 20/25 -2         Tonometry (Tonopen, 9:40 AM)       Right Left   Pressure 14 13         Pupils       Dark Light Shape React APD   Right 2 1 Round Brisk None   Left 2 1 Round Brisk None         Visual Fields (Counting fingers)       Left Right    Full Full         Extraocular Movement       Right Left    Full, Ortho Full, Ortho         Neuro/Psych     Oriented x3: Yes   Mood/Affect: Normal         Dilation     Both eyes: 1.0% Mydriacyl, 2.5% Phenylephrine @ 9:39 AM           Slit Lamp and Fundus Exam     Slit Lamp Exam       Right Left   Lids/Lashes Dermatochalasis - upper lid, mild Meibomian gland dysfunction Dermatochalasis - upper lid, Telangiectasia, mild Meibomian gland dysfunction   Conjunctiva/Sclera White and quiet White and quiet   Cornea 3+Punctate epithelial erosions, mild arcus, central haze, well healed cataract wound 1-2+ fine Punctate epithelial erosions, central haze   Anterior Chamber Deep and quiet, narrow temporal  angle Deep and quiet, narrow temporal angle   Iris Round and dilated Round and moderately dilated to 5.19m   Lens Posterior chamber intraocular lens, open PC Posterior chamber intraocular lens   Vitreous Vitreous syneresis, Posterior vitreous detachment Vitreous syneresis         Fundus Exam       Right Left   Disc Sharp rim, Pallor, +cupping, mild, temporal Peripapillary atrophy, superior rim thinning Pink and Sharp, temporal Peripapillary atrophy   C/D Ratio 0.75 0.5   Macula Flat, Blunted foveal reflex, +focal CNV/PED nasal macula with partial pigment ring, mild interval improvement in SRF, Drusen, no heme, RPE mottling and clumping Flat, Blunted foveal reflex, drusen, trace ERM, Retinal pigment epithelial  mottling and clumping, No heme or edema   Vessels Vascular attenuation, Tortuous Vascular attenuation, Tortuous   Periphery Attached, mild reticular degeneration, No heme, No RT/RD Attached; no heme            IMAGING AND PROCEDURES  Imaging and Procedures for '@TODAY'$ @  OCT, Retina - OU - Both Eyes       Right Eye Quality was good. Central Foveal Thickness: 198. Progression has improved. Findings include intraretinal hyper-reflective material, pigment epithelial detachment, subretinal hyper-reflective material, retinal drusen , normal foveal contour, outer retinal atrophy, no IRF, subretinal fluid (Mild interval improvement in SRF overlying nasal PED, partial PVD).   Left Eye Quality was good. Central Foveal Thickness: 211. Progression has been stable. Findings include normal foveal contour, no IRF, no SRF, retinal drusen (Partial PVD).   Notes *Images captured and stored on drive  Diagnosis / Impression:  OD: exudative ARMD; Mild interval improvement in SRF overlying nasal PED, partial PVD OS: NFP, no IRF/SRF; +drusen -- nonexudative ARMD  Clinical management:  See below  Abbreviations: NFP - Normal foveal profile. CME - cystoid macular edema. PED - pigment epithelial detachment. IRF - intraretinal fluid. SRF - subretinal fluid. EZ - ellipsoid zone. ERM - epiretinal membrane. ORA - outer retinal atrophy. ORT - outer retinal tubulation. SRHM - subretinal hyper-reflective material      Intravitreal Injection, Pharmacologic Agent - OD - Right Eye       Time Out 02/25/2021. 10:40 AM. Confirmed correct patient, procedure, site, and patient consented.   Anesthesia Topical anesthesia was used. Anesthetic medications included Lidocaine 2%, Proparacaine 0.5%.   Procedure Preparation included 5% betadine to ocular surface, eyelid speculum. A supplied needle was used.   Injection: 1.25 mg Bevacizumab 1.'25mg'$ /0.080m  Route: Intravitreal, Site: Right Eye   NDC: [id number] Lot: [id number]'$ , Expiration date: 03/18/2021, Waste: 0 mL   Post-op Post injection exam found visual acuity of at least counting fingers. The patient tolerated the procedure well. There were no complications. The patient received written and verbal post procedure care education. Post injection medications were not given.            ASSESSMENT/PLAN:    ICD-10-CM   1. Exudative age-related macular degeneration of right eye with active choroidal neovascularization (HCC)  H35.3211 Intravitreal Injection, Pharmacologic Agent - OD - Right Eye    Bevacizumab (AVASTIN) SOLN 1.25 mg    2. Retinal edema  H35.81 OCT, Retina - OU - Both Eyes    3. Intermediate stage nonexudative age-related macular degeneration of left eye  H35.3122     4. Diabetes mellitus type 2 without retinopathy ([facility] E11.9     5. Essential hypertension  I10     6. Hypertensive retinopathy of both eyes  H35.033  7. Pseudophakia of both eyes  Z96.1     8. PCO (posterior capsular opacification), right  H26.491     9. Primary open angle glaucoma of both eyes, unspecified glaucoma stage  H40.1130       1,2. 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/ DCIS  - h/o delayed follow up from 4 weeks to 8 weeks due to passing of husband (12.11.20-02.12.21)  - s/p IVA OD #1 (12.11.20), #2 (02.12.21), #3 (03.12.21), #4 (04.09.21), #5 (05.14.21), #6 (06.17.21), #7 (07.23.21), #8 (10.15.21), #9 (12.7.21), #10 (03.22.22), #11 (06.20.22)  - FA 12.11.20 confirms +CNVM  - OCT today shows mild interval improvement in SRF overlying nasal PED at 9 wks, partial PVD  - BCVA OD 20/25 (stable)   - recommend IVA OD #12 today, 08.24.22 w f/u in 9 wks  - pt wishes to proceed  - RBA of procedure discussed, questions answered - informed consent obtained and signed - see procedure note - Avastin informed consent form signed and scanned on 12.11.2020 (OD)              - Continue to use AT OD  - f/u 9 weeks -- DFE/OCT/possible injection; treat and extend as able  3. Age related macular degeneration, non-exudative, OS  - intermediate stage  - The incidence, anatomy, and pathology of dry AMD, risk of progression, and the AREDS and AREDS 2 study including smoking risks discussed with patient.  - recommend Amsler grid monitoring  4. Diabetes mellitus, type 2 without retinopathy  - The incidence, risk factors for progression, natural history and treatment options for diabetic retinopathy  were discussed with patient.    - The need for close monitoring of blood glucose, blood pressure, and serum lipids, avoiding cigarette or any type of tobacco, and the need for long term follow up was also discussed with patient.  - monitor  5,6. Hypertensive retinopathy OU  - discussed importance of tight BP control  - monitor  7,8. Pseudophakia OU, PCO OD  - s/p CE/IOL OU (Dr. Venetia Maxon)  - IOL in good position  - s/p yag cap OD (04.06.22) -- good PC opening  - monitor  9. POAG OU  - formerly managed by Dr. Venetia Maxon  - s/p laser w/ Dr. Venetia Maxon -- ?SLT  - IOP 14,13  - currently on latanoprost QHS OU  - cont Cosopt BID OD only  - monitor  Ophthalmic Meds Ordered this visit:  Meds ordered this encounter  Medications   Bevacizumab (AVASTIN) SOLN 1.25 mg      Return 9 weeks, for DFE, OCT.  There are no Patient Instructions on file for this visit.  This document serves as a record of services personally performed by Gardiner Sleeper, MD, PhD. It was created on their behalf by Roselee Nova, COMT. The creation of this record is the provider's dictation and/or activities during the visit.  Electronically signed by: Roselee Nova, COMT 02/25/21 12:44 PM   Gardiner Sleeper, M.D., Ph.D. Diseases & Surgery of the Retina and Vitreous [facility]  I have reviewed the above documentation for accuracy and completeness, and I agree with the above.  Gardiner Sleeper, M.D., Ph.D. 02/25/21 12:44 PM   Abbreviations: M myopia (nearsighted); A astigmatism; H hyperopia (farsighted); P presbyopia; Mrx spectacle prescription;  CTL contact lenses; OD right eye; OS left eye; OU both eyes  XT exotropia;  ET esotropia; PEK punctate epithelial keratitis; PEE punctate epithelial erosions; DES dry eye syndrome; MGD meibomian gland dysfunction; ATs artificial tears; PFAT's preservative free artificial tears; [place] nuclear sclerotic cataract; PSC posterior subcapsular cataract; ERM epi-retinal membrane; PVD posterior vitreous detachment; RD retinal detachment; DM diabetes mellitus; DR diabetic retinopathy; NPDR non-proliferative diabetic retinopathy; PDR proliferative diabetic retinopathy; CSME clinically significant macular edema; DME diabetic macular edema; dbh dot blot hemorrhages; CWS cotton wool spot; POAG primary open angle glaucoma; C/D cup-to-disc ratio; HVF humphrey visual field; GVF goldmann visual field; OCT optical coherence tomography; IOP intraocular pressure; BRVO Branch retinal vein occlusion; CRVO central retinal vein occlusion; CRAO central retinal artery occlusion; BRAO branch retinal artery occlusion; RT retinal tear; SB scleral buckle; PPV pars plana vitrectomy; VH Vitreous hemorrhage; PRP panretinal laser photocoagulation; IVK intravitreal kenalog; VMT vitreomacular traction; MH Macular hole;  NVD neovascularization of the disc; NVE neovascularization elsewhere; AREDS age related eye disease study; ARMD age related macular degeneration; POAG primary open angle glaucoma; EBMD epithelial/anterior basement membrane dystrophy; ACIOL anterior chamber intraocular lens; IOL intraocular lens; PCIOL posterior chamber intraocular lens; Phaco/IOL phacoemulsification with intraocular lens placement; [place] photorefractive keratectomy; LASIK laser assisted in situ keratomileusis; HTN hypertension; DM diabetes mellitus; COPD chronic obstructive pulmonary disease

## 2021-02-23 NOTE — Patient Instructions (Signed)
Continue to take warfarin 1 tablet daily. Recheck INR in 4 weeks. Call Coumadin clinic for any questions or changes in medications.

## 2021-02-24 NOTE — Progress Notes (Signed)
                                                                                                                                                             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   Intent: Curative  Radiation Treatment Dates: 01/22/2021 through 02/19/2021 Site Technique Total Dose (Gy) Dose per Fx (Gy) Completed Fx Beam Energies  Breast, Right: Breast_Rt 3D 42.56/42.56 2.66 16/16 10X  Breast, Right: Breast_Rt_Bst 3D 8/8 2 4/4 6X, 10X   Narrative: The patient tolerated radiation therapy relatively well. She developed fatigue and anticipated skin changes in the treatment field.  Plan: The patient will receive a call in about one month from the radiation oncology department. She will continue follow up with Dr. Lindi Adie as well.   ________________________________________________    Carola Rhine, [facility]

## 2021-02-25 ENCOUNTER — Other Ambulatory Visit: Payer: Self-pay

## 2021-02-25 ENCOUNTER — Ambulatory Visit (INDEPENDENT_AMBULATORY_CARE_PROVIDER_SITE_OTHER): Payer: Medicare Other | Admitting: Ophthalmology

## 2021-02-25 ENCOUNTER — Encounter (INDEPENDENT_AMBULATORY_CARE_PROVIDER_SITE_OTHER): Payer: Self-pay | Admitting: Ophthalmology

## 2021-02-25 DIAGNOSIS — H401130 Primary open-angle glaucoma, bilateral, stage unspecified: Secondary | ICD-10-CM | POA: Diagnosis not present

## 2021-02-25 DIAGNOSIS — I10 Essential (primary) hypertension: Secondary | ICD-10-CM

## 2021-02-25 DIAGNOSIS — H3581 Retinal edema: Secondary | ICD-10-CM | POA: Diagnosis not present

## 2021-02-25 DIAGNOSIS — H26491 Other secondary cataract, right eye: Secondary | ICD-10-CM | POA: Diagnosis not present

## 2021-02-25 DIAGNOSIS — Z961 Presence of intraocular lens: Secondary | ICD-10-CM | POA: Diagnosis not present

## 2021-02-25 DIAGNOSIS — H35033 Hypertensive retinopathy, bilateral: Secondary | ICD-10-CM

## 2021-02-25 DIAGNOSIS — E119 Type 2 diabetes mellitus without complications: Secondary | ICD-10-CM | POA: Diagnosis not present

## 2021-02-25 DIAGNOSIS — H353211 Exudative age-related macular degeneration, right eye, with active choroidal neovascularization: Secondary | ICD-10-CM | POA: Diagnosis not present

## 2021-02-25 DIAGNOSIS — H353122 Nonexudative age-related macular degeneration, left eye, intermediate dry stage: Secondary | ICD-10-CM

## 2021-02-25 MED ORDER — BEVACIZUMAB CHEMO INJECTION 1.25MG/0.05ML SYRINGE FOR KALEIDOSCOPE
1.2500 mg | INTRAVITREAL | Status: AC | PRN
  Administered 2021-02-25: 1.25 mg via INTRAVITREAL

## 2021-02-26 NOTE — Progress Notes (Signed)
  Radiation Oncology         [phone]) [phone] ________________________________  Name: [name] MRN: [id number]  Date: 01/22/2021  DOB: 25-Oct-1940  SIMULATION NOTE   NARRATIVE:  The patient underwent simulation today for ongoing radiation therapy.  The existing CT study set was employed for the purpose of virtual treatment planning.  The target and avoidance structures were reviewed and modified as necessary.  Treatment planning then occurred.  The radiation boost prescription was entered and confirmed.  A total of 3 complex treatment devices were fabricated in the form of multi-leaf collimators to shape radiation around the targets while maximally excluding nearby normal structures. I have requested : Isodose Plan.    PLAN:  This modified radiation beam arrangement is intended to continue the current radiation dose to an additional 8 Gy in 4 fractions for a total cumulative dose of 50.56 Gy.    ------------------------------------------------  [name], MD, PhD

## 2021-03-03 ENCOUNTER — Other Ambulatory Visit: Payer: Self-pay | Admitting: Cardiovascular Disease

## 2021-03-04 DIAGNOSIS — I2510 Atherosclerotic heart disease of native coronary artery without angina pectoris: Secondary | ICD-10-CM | POA: Diagnosis not present

## 2021-03-04 DIAGNOSIS — E1165 Type 2 diabetes mellitus with hyperglycemia: Secondary | ICD-10-CM | POA: Diagnosis not present

## 2021-03-04 DIAGNOSIS — I10 Essential (primary) hypertension: Secondary | ICD-10-CM | POA: Diagnosis not present

## 2021-03-10 ENCOUNTER — Telehealth (HOSPITAL_BASED_OUTPATIENT_CLINIC_OR_DEPARTMENT_OTHER): Payer: Self-pay

## 2021-03-10 ENCOUNTER — Ambulatory Visit (HOSPITAL_BASED_OUTPATIENT_CLINIC_OR_DEPARTMENT_OTHER): Payer: Medicare Other | Admitting: Cardiovascular Disease

## 2021-03-10 ENCOUNTER — Other Ambulatory Visit: Payer: Self-pay

## 2021-03-10 ENCOUNTER — Encounter (HOSPITAL_BASED_OUTPATIENT_CLINIC_OR_DEPARTMENT_OTHER): Payer: Self-pay | Admitting: Cardiovascular Disease

## 2021-03-10 VITALS — BP 124/76 | HR 74 | Ht 62.0 in | Wt 173.9 lb

## 2021-03-10 DIAGNOSIS — R0683 Snoring: Secondary | ICD-10-CM

## 2021-03-10 DIAGNOSIS — I5032 Chronic diastolic (congestive) heart failure: Secondary | ICD-10-CM | POA: Diagnosis not present

## 2021-03-10 DIAGNOSIS — I10 Essential (primary) hypertension: Secondary | ICD-10-CM

## 2021-03-10 DIAGNOSIS — E7800 Pure hypercholesterolemia, unspecified: Secondary | ICD-10-CM | POA: Diagnosis not present

## 2021-03-10 DIAGNOSIS — I825Z9 Chronic embolism and thrombosis of unspecified deep veins of unspecified distal lower extremity: Secondary | ICD-10-CM

## 2021-03-10 DIAGNOSIS — I2510 Atherosclerotic heart disease of native coronary artery without angina pectoris: Secondary | ICD-10-CM

## 2021-03-10 DIAGNOSIS — G4730 Sleep apnea, unspecified: Secondary | ICD-10-CM | POA: Diagnosis not present

## 2021-03-10 HISTORY — DX: Snoring: R06.83

## 2021-03-10 HISTORY — DX: Atherosclerotic heart disease of native coronary artery without angina pectoris: I25.10

## 2021-03-10 NOTE — Telephone Encounter (Signed)
Left message to call back x 2  PIN [phone]

## 2021-03-10 NOTE — Progress Notes (Signed)
Cardiology Office Note   Date:  03/10/2021   ID:  [name], DOB Oct 20, 1940, MRN [id number]  PCP:  Sharilyn Sites, MD  Cardiologist:   Skeet Latch, MD   No chief complaint on file.    History of Present Illness: [name] is a 80 y.o. female with CAD (50% LAD), chronic diastolic heart failure (grade 2), hypertension, hyperlipidemia, prior DVT on warfarin, breast cancer s/p lumpectomy and XRT, and diabetes type 2 who presents for follow up.  She was first seen 04/2015 at which time she reported occasional chest pain.  She had an exercise Myoview 04/2015 that showed LVEF 78% with a small defect of moderate severity in the mid anterior and apical anterior region.  This was felt to be due to breast attenuation artifact.  However, she underwent cardiac catheterization on 05/26/15 that revealed a 50% LAD lesion. There was concern that there may be a component of vasospasm so long-acting nitrates were started. Her blood pressure and hyperlipidemia medications have been titrated due to poor control.  She is also on Lasix due to lower extremity edema.  [name]' husband died of COVID double pneumonia on 07/2019.  He was treated with Remdesivir at [facility] outpatient infusion center.  She was experiencing atypical chest pain.  She was referred for [facility] that revealed LVEF 74% with no ischemia.  Since then she called our office with concern for dizziness.  She spoke with our pharmacist and was advised to stop her hydralazine.  However her dizziness did not improve and it was not thought to be due to the medication or her blood pressure.  She had an episode while sitting on the toilet had to grab the garbage can to vomit.  They recommended that she see her PCP.  She had vertigo in the past and has used meclizine.  Her LFTs were elevated so her PCP stopped her pravastatin.  She since started Praluent.   Since her last appointment 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  She  underwent lumpectomy and XRT. She notes that since her surgery she has been feeling exhausted.  She also gets very tired after taking the anazstrazole.  She is unable to drive after taking it.  She isn't getting much formal exercise but tries to stay busy. She sometimes feels tightness in her chest.  She hears her heartbeat when she is walking or laying down.  Three weeks ago she noted her heart racing when she work up.  She notes that she deos snore and feels tired throughout the day.  She also gets a sensation of hot water going up her R arm.  She notes it when sitting or laying at night.  She struggles with reflux and gas.  She has no exertional chest pain. She stopped getting Praluent in the mail and is no longer taking it.    Past Medical History:  Diagnosis Date   Antral gastritis    EGD 11/15   Asthmatic bronchitis    Back pain    CAD in native artery 03/10/2021   Chronic diastolic heart failure ([place]) 05/20/2015   Grade 2 diastolic dysfunction.  04/2015.   Chronic kidney disease    kidney function low   Diabetes mellitus    x 5 yrs   DVT of axillary vein, acute left (HCC) 07/24/12   GERD (gastroesophageal reflux disease)    Glaucoma    POAG OU   Heart murmur    History  of hiatal hernia    History of kidney stones    Hyperlipidemia 05/20/2015   Hypertension    Hypertensive retinopathy    OU   Hypothyroidism    Kidney stones    Macular degeneration    Wet OD, Dry OS   Mixed hyperlipidemia    Peripheral venous insufficiency    Pinched nerve    right elbow   Pneumonia    Sigmoid diverticulitis    Snoring 03/10/2021   Vertigo    chonic    Past Surgical History:  Procedure Laterality Date   ABDOMINAL HYSTERECTOMY     BACK SURGERY     spinal    BREAST LUMPECTOMY WITH RADIOACTIVE SEED LOCALIZATION Right 12/03/2020   Procedure: RIGHT BREAST LUMPECTOMY WITH RADIOACTIVE SEED LOCALIZATION;  Surgeon: Donnie Mesa, MD;  Location: [place];  Service: General;  Laterality: Right;    CARDIAC CATHETERIZATION N/A 05/26/2015   Procedure: Left Heart Cath and Coronary Angiography;  Surgeon: Jettie Booze, MD;  Location: [place] [facility];  Service: Cardiovascular;  Laterality: N/A;   CATARACT EXTRACTION Bilateral    CHOLECYSTECTOMY     COLON SURGERY     COLONOSCOPY N/A 12/27/2013   Procedure: COLONOSCOPY;  Surgeon: Rogene Houston, MD;  [facility];  Service: Endoscopy;  Laterality: N/A;  200   COLOSTOMY CLOSURE     ESOPHAGOGASTRODUODENOSCOPY N/A 05/07/2014   Procedure: ESOPHAGOGASTRODUODENOSCOPY (EGD);  Surgeon: Rogene Houston, MD;  [facility];  Service: Endoscopy;  Laterality: N/A;   EYE SURGERY Bilateral    Cat Sx   fracture left foot     HERNIA REPAIR     NM MYOCAR PERF WALL MOTION  01/28/2009   Normal   OTHER SURGICAL HISTORY     colostomy, colostomy reversal, for diverticulitis surgical hernia repair, arm surgery, neck surgery   US ECHOCARDIOGRAPHY  02/11/2010   Mild MR,trace TR & AI     Current Outpatient Medications  Medication Sig Dispense Refill   albuterol (PROVENTIL HFA;VENTOLIN HFA) 108 (90 BASE) MCG/ACT inhaler Inhale 2 puffs into the lungs every 4 (four) hours as needed for shortness of breath. 1 Inhaler 0   amitriptyline (ELAVIL) 25 MG tablet Take 25 mg by mouth at bedtime.     anastrozole (ARIMIDEX) 1 MG tablet Take 1 tablet (1 mg total) by mouth daily. 90 tablet 3   Black Cohosh 40 MG CAPS Take 40 mg by mouth 2 (two) times daily.     carvedilol (COREG) 6.25 MG tablet Take 1 tablet (6.25 mg total) by mouth 2 (two) times daily. 180 tablet 1   cetirizine (ZYRTEC) 10 MG tablet Take 10 mg by mouth daily.     Cholecalciferol (VITAMIN D-3) 1000 units CAPS Take 1,000 Units by mouth daily.     diazepam (VALIUM) 2 MG tablet Take 2 mg by mouth 2 (two) times daily as needed for anxiety (dizziness).     dorzolamide-timolol (COSOPT) 22.3-6.8 MG/ML ophthalmic solution INSTILL 1 DROP INTO RIGHT EYE TWICE A DAY (Patient taking  differently: Place 1 drop into the right eye 2 (two) times daily.) 30 mL 2   esomeprazole (NEXIUM) 20 MG capsule Take 20 mg by mouth daily at 12 noon.     FARXIGA 5 MG TABS tablet Take 5 mg by mouth every morning.     furosemide (LASIX) 40 MG tablet TAKE 1 TABLET (40 MG TOTAL) BY MOUTH DAILY. MAY TAKE EXTRA DAILY AS NEEDED FOR SWELLING (Patient taking differently: Take 40 mg by  mouth 2 (two) times daily as needed for edema.) 180 tablet 1   gabapentin (NEURONTIN) 300 MG capsule Take 300 mg by mouth 3 (three) times daily.     glimepiride (AMARYL) 2 MG tablet Take 2 mg by mouth daily.  2   hydrALAZINE (APRESOLINE) 25 MG tablet Take 1 tablet (25 mg total) by mouth in the morning and at bedtime. 180 tablet 3   isosorbide mononitrate (IMDUR) 30 MG 24 hr tablet Take 1 tablet (30 mg total) by mouth daily. 90 tablet 3   latanoprost (XALATAN) 0.005 % ophthalmic solution Place 1 drop into both eyes at bedtime.      meclizine (ANTIVERT) 25 MG tablet Take 25 mg by mouth 2 (two) times daily as needed for dizziness.     Multiple Vitamins-Minerals (PRESERVISION AREDS 2 PO) Take 1 capsule by mouth in the morning and at bedtime.     Omega-3 Fatty Acids (FISH OIL) 1200 MG CAPS Take 1,200 mg by mouth 2 (two) times daily.     polyethylene glycol (MIRALAX / GLYCOLAX) packet Take 17 g by mouth daily.     potassium chloride SA (KLOR-CON) 20 MEQ tablet Take 0.5 tablets (10 mEq total) by mouth daily. 45 tablet 9   tetrahydrozoline 0.05 % ophthalmic solution Place 1 drop into both eyes at bedtime.     TRADJENTA 5 MG TABS tablet Take 5 mg by mouth daily.     vitamin B-12 (CYANOCOBALAMIN) 100 MCG tablet Take 100 mcg by mouth daily.     warfarin (COUMADIN) 2 MG tablet TAKE ONE TABLET BY MOUTH daily EXCEPT 1  & 1/2 tablets ON Monday AND Thursdays OR as directed 45 tablet 0   No current facility-administered medications for this visit.    Allergies:   Alphagan [brimonidine], Diflunisal, Vioxx [rofecoxib], Metformin and  related, Nexlizet [bempedoic acid-ezetimibe], Pravastatin, Repatha [evolocumab], Codeine, Elemental sulfur, Motrin [ibuprofen], and Penicillins    Social History:  The patient  reports that she quit smoking about 7 years ago. Her smoking use included cigarettes. She has never used smokeless tobacco. She reports that she does not drink alcohol and does not use drugs.   Family History:  The patient's family history includes Bone cancer in her maternal uncle; Breast cancer in her maternal grandmother and sister; CVA (age of onset: 59) in her maternal grandmother; Cancer in her mother; Glaucoma in her maternal uncle; Heart attack (age of onset: 58) in her brother; Heart disease in her maternal grandmother; Leukemia in her maternal aunt; Other (age of onset: 72) in her mother; Stroke in her maternal grandmother.    ROS:  Please see the history of present illness.   Otherwise, review of systems are positive for hiatal hernia, GERD    All other systems are reviewed and negative.    PHYSICAL EXAM: VS:  BP 124/76   Pulse 74   Ht '5\' 2"'$  (1.575 m)   Wt 173 lb 14.4 oz (78.9 kg)   BMI 31.81 kg/m  , BMI Body mass index is 31.81 kg/m. GENERAL:  Well appearing HEENT: Pupils equal round and reactive, fundi not visualized, oral mucosa unremarkable NECK:  No jugular venous distention, waveform within normal limits, carotid upstroke brisk and symmetric, no bruit LUNGS:  Clear to auscultation bilaterally HEART:  RRR.  PMI not displaced or sustained,S1 and S2 within normal limits, no S3, no S4, no clicks, no rubs, no murmurs ABD:  Flat, positive bowel sounds normal in frequency in pitch, no bruits, no rebound, no guarding, no  midline pulsatile mass, no hepatomegaly, no splenomegaly EXT:  2 plus pulses throughout, trace LE edema, no cyanosis no clubbing SKIN:  No rashes no nodules NEURO:  Cranial nerves II through XII grossly intact, motor grossly intact throughout PSYCH:  Cognitively intact, oriented to person  place and time   EKG:  EKG is ordered today. 02/16/16: Sinus rhythm rate 83 bpm.  PAC.  RBBB.  LPFB. 03/02/17: Sinus rhythm.  RBBB.  LPFB 09/15/17: Sinus rhythm.  Rate 64 bpm. RBBB  09/28/2019: Sinus rhythm.  Rate 75 bpm.  RBBB. 04/11/20: Sinus rhythm.  Rate 75 bpm.  RBBB.   03/10/21: Sinus rhythm.  Rate 74 bpm.  RBBB.  LPFB.    Lexiscan Myoview 10/2019: Nuclear stress EF: 74%. The left ventricular ejection fraction is hyperdynamic (>65%). There was no ST segment deviation noted during stress. This is a low risk study. There is no evidence of ischemia or previous infarction. The study is normal.  Exercise Myoview 04/30/15 The left ventricular ejection fraction is hyperdynamic (>65%). Nuclear stress EF: 78%. Blood pressure demonstrated a hypertensive response to exercise. Upsloping ST segment depression ST segment depression was noted during stress in the III, II and aVF leads. This is not diagnostic for ischemia. Defect 1: There is a small defect of moderate severity present in the mid anterior and apical anterior location. Wall motion is normal, so this is likely artifact due to breast attenuation. However, cannot rule out LAD ischemia. This is a low risk study.  Cardiac cath 05/26/15: Mid LAD lesion, 50% stenosed. FFR of this lesion showed value of 0.85. This is felt to be nonsignificant. Normal LVEDP.   Continue aggressive medical therapy. Would consider adding long-acting nitrate as the patient may have some component of vasospasm. Of note, in the future , if repeat catheterization was needed , would consider using a 5 [place] guide catheter as her left main is very short and the catheter tends to deep seat  Into either the LAD or the circumflex.  Recent Labs: 11/25/2020: BUN 19; Creatinine, Ser 1.15; Hemoglobin 14.0; Platelets 247; Potassium 3.9; Sodium 141   03/12/15: INR 2.9   Lipid Panel    Component Value Date/Time   CHOL 225 (H) 10/05/2019 0859   TRIG 189 (H) 10/05/2019 0859    HDL 43 10/05/2019 0859   CHOLHDL 5.2 (H) 10/05/2019 0859   CHOLHDL 4.9 08/17/2016 0914   VLDL 33 (H) 08/17/2016 0914   LDLCALC 148 (H) 10/05/2019 0859      Wt Readings from Last 3 Encounters:  03/10/21 173 lb 14.4 oz (78.9 kg)  01/15/21 175 lb 6.4 oz (79.6 kg)  12/29/20 177 lb (80.3 kg)      ASSESSMENT AND PLAN:  # Non-obstructive CAD:  # Hyperlipidemia: # Chest pain: She is no longer having chest pain.  She had 50% LAD stenosis.  Lexiscan Myoview was negative 10/2019.  She has no exertional chest pain and her symptoms seem more GI related.  Continue carvedilol and Imdur.   # Chronic diastolic heart failure: # Hypertension:  BP stable on carvedilol, Imdur, hydralazine and Imdur.   # DVT:  Continue warfarin.  INR goal 2-3.   # Snoring:  # Daytime somnolence:  # Palpitations:  Will get a sleep study.  If negative she needs to wear and ambulatory monitor.    Current medicines are reviewed at length with the patient today.  The patient does not have concerns regarding medicines.  The following changes have been made:  no change  Labs/ tests ordered today include:   Orders Placed This Encounter  Procedures   EKG 12-Lead   [facility] Sleep Study    Time spent: 53 minutes-Greater than 50% of this time was spent in counseling, explanation of diagnosis, planning of further management, and coordination of care.    Disposition:   FU with Trinidad Ingle C. Oval Linsey, MD in 6 months.     Signed, Skeet Latch, MD  03/10/2021 5:16 PM    [place]

## 2021-03-10 NOTE — Patient Instructions (Addendum)
Medication Instructions:  WILL REACH OUT TO PHARMACY TEAM ABOUT THE PURULENT   *If you need a refill on your cardiac medications before your next appointment, please call your pharmacy*  Lab Work: NONE   Testing/Procedures: NONE   Follow-Up: At [facility], you and your health needs are our priority.  As part of our continuing mission to provide you with exceptional heart care, we have created designated Provider Care Teams.  These Care Teams include your primary Cardiologist (physician) and Advanced Practice Providers (APPs -  Physician Assistants and Nurse Practitioners) who all work together to provide you with the care you need, when you need it.  We recommend signing up for the patient portal called "MyChart".  Sign up information is provided on this After Visit Summary.  MyChart is used to connect with patients for Virtual Visits (Telemedicine).  Patients are able to view lab/test results, encounter notes, upcoming appointments, etc.  Non-urgent messages can be sent to your provider as well.   To learn more about what you can do with MyChart, go to [email or web address].    Your next appointment:   3 month(s)  The format for your next appointment:   In Person  Provider:   Skeet Latch, MD or Laurann Montana, NP  Other Instructions  WatchPAT?  Is a FDA cleared portable home sleep study test that uses a watch and 3 points of contact to monitor 7 different channels, including your heart rate, oxygen saturations, body position, snoring, and chest motion.  The study is easy to use from the comfort of your own home and accurately detect sleep apnea.  Before bed, you attach the chest sensor, attached the sleep apnea bracelet to your nondominant hand, and attach the finger probe.  After the study, the raw data is downloaded from the watch and scored for apnea events.   For more information: [email or web address]

## 2021-03-10 NOTE — Telephone Encounter (Signed)
Per [facility] Prior authorization is not required for CPT 95800, Ref#[id number].message left for patient to call for PIN .TDS

## 2021-03-12 ENCOUNTER — Encounter (INDEPENDENT_AMBULATORY_CARE_PROVIDER_SITE_OTHER): Payer: Medicare Other | Admitting: Cardiology

## 2021-03-12 DIAGNOSIS — G4733 Obstructive sleep apnea (adult) (pediatric): Secondary | ICD-10-CM

## 2021-03-12 NOTE — Telephone Encounter (Signed)
Patient is returning call.  °

## 2021-03-12 NOTE — Telephone Encounter (Signed)
Spoke with patient regarding PIN number  If she has any issues with connecting tonight she will call back tomorrow

## 2021-03-18 NOTE — Procedures (Signed)
   Sleep Study Report  Patient Information Study Date: 03/12/21 Patient Name: [name] Patient ID: [id number] Birth Date: 2040/07/27 Age: 80 Gender: Female BMI: 32.0 (W=174 lb, H=5' 2'') Referring Physician: Skeet Latch, MD  TEST DESCRIPTION: Home sleep apnea testing was completed using the WatchPat, a Type 1 device, utilizing peripheral arterial tonometry (PAT), chest movement, actigraphy, pulse oximetry, pulse rate, body position and snore. AHI was calculated with apnea and hypopnea using valid sleep time as the denominator. RDI includes apneas, hypopneas, and RERAs. The data acquired and the scoring of sleep and all associated events were performed in accordance with the recommended standards and specifications as outlined in the AASM Manual for the Scoring of Sleep and Associated Events 2.2.0 (2015).  FINDINGS: 1. Mild Obstructive Sleep Apnea with AHI 13.8/hr. 2. No Central Sleep Apnea with pAHIc 0.4/hr. 3. Oxygen desaturations as low as 70%. 4. Mild snoring was present. O2 sats were < 88% for 22.9 min. 5. Total sleep time was 7 hrs and 41 min. 6. 27.2% of total sleep time was spent in REM sleep. 7. Normal sleep onset latency at 26 min. 8. Shortened REM sleep onset latency at 41 min. 9. Total awakenings were 2.  DIAGNOSIS: Mild Obstructive Sleep Apnea (G47.33) Nocturnal Hypoxemia  RECOMMENDATIONS: 1. Clinical correlation of these findings is necessary. The decision to treat obstructive sleep apnea (OSA) is usually based on the presence of apnea symptoms or the presence of associated medical conditions such as Hypertension, Congestive Heart Failure, Atrial Fibrillation or Obesity. The most common symptoms of OSA are snoring, gasping for breath while sleeping, daytime sleepiness and fatigue.  2. Initiating apnea therapy is recommended given the presence of symptoms and/or associated conditions. Recommend proceeding with one of the following:   a. Auto-CPAP  therapy with a pressure range of 5-20cm H2O.   b. An oral appliance (OA) that can be obtained from certain dentists with expertise in sleep medicine. These are primarily of use in non-obese patients with mild and moderate disease.   c. An ENT consultation which may be useful to look for specific causes of obstruction and possible treatment options.   d. If patient is intolerant to PAP therapy, consider referral to ENT for evaluation for hypoglossal nerve stimulator. 3. Close follow-up is necessary to ensure success with CPAP or oral appliance therapy for maximum benefit . 4. A follow-up oximetry study on CPAP is recommended to assess the adequacy of therapy and determine the need for supplemental oxygen or the potential need for Bi-level therapy. An arterial blood gas to determine the adequacy of baseline ventilation and oxygenation should also be considered.  5. Healthy sleep recommendations include: adequate nightly sleep (normal 7-9 hrs/night), avoidance of caffeine after noon and alcohol near bedtime, and maintaining a sleep environment that is cool, dark and quiet.  6. Weight loss for overweight patients is recommended. Even modest amounts of weight loss can significantly improve the severity of sleep apnea.  7. Snoring recommendations include: weight loss where appropriate, side sleeping, and avoidance of alcohol before bed.  8. Operation of motor vehicle should be avoided when sleepy.  Signature: Electronically Signed: 03/18/21 Fransico Him, MD; [facility]; [place], American Board of Sleep Medicine Report prepared by: Fransico Him

## 2021-03-23 ENCOUNTER — Other Ambulatory Visit: Payer: Self-pay

## 2021-03-23 ENCOUNTER — Ambulatory Visit (INDEPENDENT_AMBULATORY_CARE_PROVIDER_SITE_OTHER): Payer: Medicare Other | Admitting: *Deleted

## 2021-03-23 DIAGNOSIS — Z5181 Encounter for therapeutic drug level monitoring: Secondary | ICD-10-CM | POA: Diagnosis not present

## 2021-03-23 DIAGNOSIS — I82A12 Acute embolism and thrombosis of left axillary vein: Secondary | ICD-10-CM

## 2021-03-23 LAB — POCT INR: INR: 2.1 (ref 2.0–3.0)

## 2021-03-23 NOTE — Patient Instructions (Signed)
Continue to take warfarin 1 tablet daily. Recheck INR in 4 weeks. Call Coumadin clinic for any questions or changes in medications.

## 2021-03-24 ENCOUNTER — Telehealth: Payer: Self-pay | Admitting: *Deleted

## 2021-03-25 ENCOUNTER — Telehealth: Payer: Self-pay | Admitting: *Deleted

## 2021-03-25 DIAGNOSIS — G4733 Obstructive sleep apnea (adult) (pediatric): Secondary | ICD-10-CM

## 2021-03-25 NOTE — Telephone Encounter (Signed)
The patient has been notified of the result and verbalized understanding.  All questions (if any) were answered. [name], [place] 03/25/2021 5:31 PM    Upon patient request DME selection is [facility] Patient understands he will be contacted by [facility] to set up his cpap. Patient understands to call if [facility] does not contact him with new setup in a timely manner. Patient understands they will be called once confirmation has been received from [facility] that they have received their new machine to schedule 10 week follow up appointment. [facility] notified of new cpap order  Please add to airview Patient was grateful for the call and thanked me

## 2021-03-25 NOTE — Telephone Encounter (Signed)
-----   Message from Sueanne Margarita, MD sent at 03/18/2021  7:01 PM EDT ----- Please let patient know that they have sleep apnea and recommend treating with CPAP.  Please order an auto CPAP from 4-15cm H2O with heated humidity and mask of choice.  Order overnight pulse ox on CPAP.  Followup with me in 6 weeks.

## 2021-03-26 ENCOUNTER — Encounter: Payer: Self-pay | Admitting: *Deleted

## 2021-03-26 NOTE — Telephone Encounter (Signed)
-----   Message from Sueanne Margarita, MD sent at 03/18/2021  7:01 PM EDT ----- Please let patient know that they have sleep apnea and recommend treating with CPAP.  Please order an auto CPAP from 4-15cm H2O with heated humidity and mask of choice.  Order overnight pulse ox on CPAP.  Followup with me in 6 weeks.

## 2021-03-26 NOTE — Telephone Encounter (Signed)
This encounter was created in error - please disregard.

## 2021-03-30 ENCOUNTER — Other Ambulatory Visit: Payer: Self-pay

## 2021-03-30 ENCOUNTER — Ambulatory Visit
Admission: RE | Admit: 2021-03-30 | Discharge: 2021-03-30 | Disposition: A | Discharge status: Home / Self Care | Payer: Medicare Other | Source: Ambulatory Visit | Attending: Radiation Oncology | Admitting: Radiation Oncology

## 2021-03-30 DIAGNOSIS — D0511 Intraductal carcinoma in situ of right breast: Secondary | ICD-10-CM

## 2021-03-30 NOTE — Progress Notes (Signed)
  Radiation Oncology         [phone] ________________________________  Name: [name] MRN: [id number]  Date of Service: 03/30/2021  DOB: 1941/01/21  Post Treatment Telephone Note  Diagnosis:   Intermediate Grade, ER/PR positive DCIS of the right breast.   Interval Since Last Radiation:  6 weeks   01/22/2021 through 02/19/2021 Site Technique Total Dose (Gy) Dose per Fx (Gy) Completed Fx Beam Energies  Breast, Right: Breast_Rt 3D 42.56/42.56 2.66 16/16 10X  Breast, Right: Breast_Rt_Bst 3D 8/8 2 4/4 6X, 10X   Narrative:  The patient was contacted today for routine follow-up. During treatment she did very well with radiotherapy and did not have significant desquamation.    Impression/Plan: 1. Intermediate Grade, ER/PR positive DCIS of the right breast. I was unable to reach the patient but left a voicemail and on the message, I  discussed that we would be happy to continue to follow her as needed, but she will also continue to follow up with Dr. Lindi Adie in medical oncology. She was counseled on skin care as well as measures to avoid sun exposure to this area.        Carola Rhine, PAC

## 2021-04-02 ENCOUNTER — Encounter: Payer: Medicare Other | Admitting: Adult Health

## 2021-04-03 ENCOUNTER — Telehealth: Payer: Self-pay | Admitting: Adult Health

## 2021-04-03 DIAGNOSIS — I2510 Atherosclerotic heart disease of native coronary artery without angina pectoris: Secondary | ICD-10-CM | POA: Diagnosis not present

## 2021-04-03 DIAGNOSIS — I10 Essential (primary) hypertension: Secondary | ICD-10-CM | POA: Diagnosis not present

## 2021-04-03 NOTE — Telephone Encounter (Signed)
Per 9/30 schedule msg, appt has been rescheduled and pt has been called and confirmed appt.

## 2021-04-06 ENCOUNTER — Inpatient Hospital Stay: Payer: Medicare Other | Attending: Adult Health | Admitting: Adult Health

## 2021-04-06 ENCOUNTER — Encounter: Payer: Self-pay | Admitting: Adult Health

## 2021-04-06 ENCOUNTER — Other Ambulatory Visit: Payer: Self-pay

## 2021-04-06 VITALS — BP 145/64 | HR 80 | Temp 98.2°F | Resp 18 | Ht 62.0 in | Wt 173.6 lb

## 2021-04-06 DIAGNOSIS — Z79899 Other long term (current) drug therapy: Secondary | ICD-10-CM | POA: Diagnosis not present

## 2021-04-06 DIAGNOSIS — Z79811 Long term (current) use of aromatase inhibitors: Secondary | ICD-10-CM | POA: Insufficient documentation

## 2021-04-06 DIAGNOSIS — Z170 Estrogen receptor positive status [ER+]: Secondary | ICD-10-CM | POA: Diagnosis not present

## 2021-04-06 DIAGNOSIS — M85851 Other specified disorders of bone density and structure, right thigh: Secondary | ICD-10-CM | POA: Diagnosis not present

## 2021-04-06 DIAGNOSIS — D0511 Intraductal carcinoma in situ of right breast: Secondary | ICD-10-CM | POA: Diagnosis not present

## 2021-04-06 NOTE — Progress Notes (Signed)
SURVIVORSHIP VISIT:   BRIEF ONCOLOGIC HISTORY:  Oncology History  Ductal carcinoma in situ (DCIS) of right breast  10/09/2020 Initial Diagnosis   Screening mammogram detected right breast calcifications: Initial biopsy showed focal atypical ductal hyperplasia   12/03/2020 Surgery   12/03/2020: Right lumpectomy:  intermediate grade DCIS ER 90% positive, PR negative margins negative   12/29/2020 Cancer Staging   Staging form: Breast, AJCC 8th Edition - Clinical stage from 12/29/2020: Stage 0 (cTis (DCIS), cN0, cM0, G2, ER+, PR-, HER2: Not Assessed) - Signed by [name], MD on 12/29/2020 Stage prefix: Initial diagnosis Histologic grading system: 3 grade system   01/22/2021 - 02/19/2021 Radiation Therapy   Radiation Treatment Dates: 01/22/2021 through 02/19/2021 Site Technique Total Dose (Gy) Dose per Fx (Gy) Completed Fx Beam Energies  Breast, Right: Breast_Rt 3D 42.56/42.56 2.66 16/16 10X  Breast, Right: Breast_Rt_Bst 3D 8/8 2 4/4 6X, 10X      04/03/2021 Cancer Staging   Staging form: Breast, AJCC 8th Edition - Pathologic: Stage 0 (pTis (DCIS), pN0, cM0) - Signed by [name], NP on 04/03/2021 Stage prefix: Initial diagnosis    INTERVAL HISTORY:  [name] to review her survivorship care plan detailing her treatment course for breast cancer, as well as monitoring long-term side effects of that treatment, education regarding health maintenance, screening, and overall wellness and health promotion.  She is taking anastrozole daily and is having difficulty tolerating the side effects.  She is experiencing increased hot flashes.  She wants to understand what she can do to help ease her side effects.    REVIEW OF SYSTEMS:  Review of Systems  Constitutional:  Negative for appetite change, chills, fatigue, fever and unexpected weight change.  HENT:   Negative for hearing loss, lump/mass and trouble swallowing.   Eyes:  Negative for eye problems and icterus.  Respiratory:   Negative for chest tightness, cough and shortness of breath.   Cardiovascular:  Negative for chest pain, leg swelling and palpitations.  Gastrointestinal:  Negative for abdominal distention, abdominal pain, constipation, diarrhea, nausea and vomiting.  Endocrine: Positive for hot flashes.  Genitourinary:  Negative for difficulty urinating.   Musculoskeletal:  Positive for arthralgias.  Skin:  Negative for itching and rash.  Neurological:  Negative for dizziness, extremity weakness, headaches and numbness.  Hematological:  Negative for adenopathy. Does not bruise/bleed easily.  Psychiatric/Behavioral:  Negative for depression. The patient is not nervous/anxious.   Breast: Denies any new nodularity, masses, tenderness, nipple changes, or nipple discharge.      ONCOLOGY TREATMENT TEAM:  1. Surgeon:  Dr. Georgette Dover at [facility] 2. Medical Oncologist: Dr. Lindi Adie  3. Radiation Oncologist: Dr. Lisbeth Renshaw    PAST MEDICAL/SURGICAL HISTORY:  Past Medical History:  Diagnosis Date   Antral gastritis    EGD 11/15   Asthmatic bronchitis    Back pain    CAD in native artery 03/10/2021   Chronic diastolic heart failure ([place]) 05/20/2015   Grade 2 diastolic dysfunction.  04/2015.   Chronic kidney disease    kidney function low   Diabetes mellitus    x 5 yrs   DVT of axillary vein, acute left (HCC) 07/24/12   GERD (gastroesophageal reflux disease)    Glaucoma    POAG OU   Heart murmur    History of hiatal hernia    History of kidney stones    Hyperlipidemia 05/20/2015   Hypertension    Hypertensive retinopathy    OU   Hypothyroidism    Kidney stones  Macular degeneration    Wet OD, Dry OS   Mixed hyperlipidemia    Peripheral venous insufficiency    Pinched nerve    right elbow   Pneumonia    Sigmoid diverticulitis    Snoring 03/10/2021   Vertigo    chonic   Past Surgical History:  Procedure Laterality Date   ABDOMINAL HYSTERECTOMY     BACK SURGERY     spinal    BREAST  LUMPECTOMY WITH RADIOACTIVE SEED LOCALIZATION Right 12/03/2020   Procedure: RIGHT BREAST LUMPECTOMY WITH RADIOACTIVE SEED LOCALIZATION;  Surgeon: Donnie Mesa, MD;  Location: [place];  Service: General;  Laterality: Right;   CARDIAC CATHETERIZATION N/A 05/26/2015   Procedure: Left Heart Cath and Coronary Angiography;  Surgeon: Jettie Booze, MD;  Location: [facility];  Service: Cardiovascular;  Laterality: N/A;   CATARACT EXTRACTION Bilateral    CHOLECYSTECTOMY     COLON SURGERY     COLONOSCOPY N/A 12/27/2013   Procedure: COLONOSCOPY;  Surgeon: Rogene Houston, MD;  [facility];  Service: Endoscopy;  Laterality: N/A;  200   COLOSTOMY CLOSURE     ESOPHAGOGASTRODUODENOSCOPY N/A 05/07/2014   Procedure: ESOPHAGOGASTRODUODENOSCOPY (EGD);  Surgeon: Rogene Houston, MD;  [facility];  Service: Endoscopy;  Laterality: N/A;   EYE SURGERY Bilateral    Cat Sx   fracture left foot     HERNIA REPAIR     NM MYOCAR PERF WALL MOTION  01/28/2009   Normal   OTHER SURGICAL HISTORY     colostomy, colostomy reversal, for diverticulitis surgical hernia repair, arm surgery, neck surgery   US ECHOCARDIOGRAPHY  02/11/2010   Mild MR,trace TR & AI     ALLERGIES:  Allergies  Allergen Reactions   Alphagan [Brimonidine] Itching   Diflunisal Swelling    Other reaction(s): ENTIRE BODY SWELLING   Vioxx [Rofecoxib] Shortness Of Breath   Metformin And Related     Kidney failure   Nexlizet [Bempedoic Acid-Ezetimibe]     Causes elevated Liver and Kidney function   Pravastatin    Repatha [Evolocumab]     MYALGIAS   Codeine Rash   Elemental Sulfur Rash   Motrin [Ibuprofen] Rash   Penicillins Rash     CURRENT MEDICATIONS:  Outpatient Encounter Medications as of 04/06/2021  Medication Sig   albuterol (PROVENTIL HFA;VENTOLIN HFA) 108 (90 BASE) MCG/ACT inhaler Inhale 2 puffs into the lungs every 4 (four) hours as needed for shortness of breath.   amitriptyline (ELAVIL) 25 MG  tablet Take 25 mg by mouth at bedtime.   anastrozole (ARIMIDEX) 1 MG tablet Take 1 tablet (1 mg total) by mouth daily.   Black Cohosh 40 MG CAPS Take 40 mg by mouth 2 (two) times daily.   carvedilol (COREG) 6.25 MG tablet Take 1 tablet (6.25 mg total) by mouth 2 (two) times daily.   cetirizine (ZYRTEC) 10 MG tablet Take 10 mg by mouth daily.   Cholecalciferol (VITAMIN D-3) 1000 units CAPS Take 1,000 Units by mouth daily.   diazepam (VALIUM) 2 MG tablet Take 2 mg by mouth 2 (two) times daily as needed for anxiety (dizziness).   dorzolamide-timolol (COSOPT) 22.3-6.8 MG/ML ophthalmic solution INSTILL 1 DROP INTO RIGHT EYE TWICE A DAY (Patient taking differently: Place 1 drop into the right eye 2 (two) times daily.)   esomeprazole (NEXIUM) 20 MG capsule Take 20 mg by mouth daily at 12 noon.   FARXIGA 5 MG TABS tablet Take 5 mg by mouth every morning.  furosemide (LASIX) 40 MG tablet TAKE 1 TABLET (40 MG TOTAL) BY MOUTH DAILY. MAY TAKE EXTRA DAILY AS NEEDED FOR SWELLING (Patient taking differently: Take 40 mg by mouth 2 (two) times daily as needed for edema.)   gabapentin (NEURONTIN) 300 MG capsule Take 300 mg by mouth 3 (three) times daily.   glimepiride (AMARYL) 2 MG tablet Take 2 mg by mouth daily.   hydrALAZINE (APRESOLINE) 25 MG tablet Take 1 tablet (25 mg total) by mouth in the morning and at bedtime.   isosorbide mononitrate (IMDUR) 30 MG 24 hr tablet Take 1 tablet (30 mg total) by mouth daily.   latanoprost (XALATAN) 0.005 % ophthalmic solution Place 1 drop into both eyes at bedtime.    meclizine (ANTIVERT) 25 MG tablet Take 25 mg by mouth 2 (two) times daily as needed for dizziness.   Multiple Vitamins-Minerals (PRESERVISION AREDS 2 PO) Take 1 capsule by mouth in the morning and at bedtime.   Omega-3 Fatty Acids (FISH OIL) 1200 MG CAPS Take 1,200 mg by mouth 2 (two) times daily.   polyethylene glycol (MIRALAX / GLYCOLAX) packet Take 17 g by mouth daily.   potassium chloride SA (KLOR-CON) 20  MEQ tablet Take 0.5 tablets (10 mEq total) by mouth daily.   tetrahydrozoline 0.05 % ophthalmic solution Place 1 drop into both eyes at bedtime.   TRADJENTA 5 MG TABS tablet Take 5 mg by mouth daily.   vitamin B-12 (CYANOCOBALAMIN) 100 MCG tablet Take 100 mcg by mouth daily.   warfarin (COUMADIN) 2 MG tablet TAKE ONE TABLET BY MOUTH daily EXCEPT 1  & 1/2 tablets ON Monday AND Thursdays OR as directed   No facility-administered encounter medications on file as of 04/06/2021.     ONCOLOGIC FAMILY HISTORY:  Family History  Problem Relation Age of Onset   Other Mother 60       Cause unknown   Cancer Mother        liver   Heart attack Brother 71       deceased   CVA Maternal Grandmother 90       deceased   Heart disease Maternal Grandmother    Stroke Maternal Grandmother    Breast cancer Maternal Grandmother    Glaucoma Maternal Uncle    Bone cancer Maternal Uncle    Leukemia Maternal Aunt    Breast cancer Sister      GENETIC COUNSELING/TESTING: Not at this time  SOCIAL HISTORY:  Social History   Socioeconomic History   Marital status: Widowed    Spouse name: Not on file   Number of children: Not on file   Years of education: Not on file   Highest education level: Not on file  Occupational History   Not on file  Tobacco Use   Smoking status: Former    Types: Cigarettes    Quit date: 12/10/2013    Years since quitting: 7.3   Smokeless tobacco: Never   Tobacco comments:    Smoke 1-1 1/2 packs a day  Vaping Use   Vaping Use: Never used  Substance and Sexual Activity   Alcohol use: No   Drug use: No   Sexual activity: Not Currently    Birth control/protection: Surgical    Comment: partial hysterectomy  Other Topics Concern   Not on file  Social History Narrative   Not on file   Social Determinants of Health   Financial Resource Strain: Low Risk    Difficulty of Paying Living Expenses: Not hard at all  Food Insecurity: No Food Insecurity   Worried About  Charity fundraiser in the Last Year: Never true   Ran Out of Food in the Last Year: Never true  Transportation Needs: No Transportation Needs   Lack of Transportation (Medical): No   Lack of Transportation (Non-Medical): No  Physical Activity: Inactive   Days of Exercise per Week: 0 days   Minutes of Exercise per Session: 0 min  Stress: Not on file  Social Connections: Not on file  Intimate Partner Violence: Not At Risk   Fear of Current or Ex-Partner: No   Emotionally Abused: No   Physically Abused: No   Sexually Abused: No     OBSERVATIONS/OBJECTIVE:  BP (!) 145/64 (BP Location: Left Arm, Patient Position: Sitting)   Pulse 80   Temp 98.2 F (36.8 C) (Tympanic)   Resp 18   Ht $R'5\' 2"'Fc$  (1.575 m)   Wt 173 lb 9.6 oz (78.7 kg)   SpO2 92%   BMI 31.75 kg/m  GENERAL: Patient is a well appearing female in no acute distress HEENT:  Sclerae anicteric.  Oropharynx clear and moist. No ulcerations or evidence of oropharyngeal candidiasis. Neck is supple.  NODES:  No cervical, supraclavicular, or axillary lymphadenopathy palpated.  BREAST EXAM:  right breast s/p lumpectomy and radiation, no sign of local recurrence, left breast benign LUNGS:  Clear to auscultation bilaterally.  No wheezes or rhonchi. HEART:  Regular rate and rhythm. No murmur appreciated. ABDOMEN:  Soft, nontender.  Positive, normoactive bowel sounds. No organomegaly palpated. MSK:  No focal spinal tenderness to palpation. Full range of motion bilaterally in the upper extremities. EXTREMITIES:  No peripheral edema.   SKIN:  Clear with no obvious rashes or skin changes. No nail dyscrasia. NEURO:  Nonfocal. Well oriented.  Appropriate affect.   LABORATORY DATA:  None for this visit.  DIAGNOSTIC IMAGING:  None for this visit.      ASSESSMENT AND PLAN:  Ms.. [name] is a pleasant 80 y.o. female with Stage 0 right breast DCIS, ER+/PR+, diagnosed in 10/2020, treated with lumpectomy, adjuvant radiation therapy, and  anti-estrogen therapy with Anastrozole beginning in 02/2021.  She presents to the Survivorship Clinic for our initial meeting and routine follow-up post-completion of treatment for breast cancer.    1. Stage 0 right breast cancer:  Ms. Call is continuing to recover from definitive treatment for breast cancer. She will follow-up with her medical oncologist, Dr. Lindi Adie in 6 months with history and physical exam per surveillance protocol.  She will continue her anti-estrogen therapy with Anastrozole.  I reviewed with her that considering her side effects, she does not have to take the anastrozole since she received the adjuvant radiation.  She is very motivated to take the Anastrozole.  Instead, she will stop the anastrozole x 2 weeks then start back at 1/2 dose.  Hopefully this will ameliorate some of her hot flashes.  Her mammogram is due 09/2021; orders placed today. Today, a comprehensive survivorship care plan and treatment summary was reviewed with the patient today detailing her breast cancer diagnosis, treatment course, potential late/long-term effects of treatment, appropriate follow-up care with recommendations for the future, and patient education resources.  A copy of this summary, along with a letter will be sent to the patient's primary care provider via mail/fax/In Basket message after today's visit.    2. Bone health:  Given Ms. Jenniges's age/history of breast cancer and her current treatment regimen including anti-estrogen therapy with Anastrozole, she is at risk for  bone demineralization.  Her most recent bone density in 09/2020 showed osteopenia with a T score of -1.4 in her right femur.  She was given education on specific activities to promote bone health.  3. Cancer screening:  Due to Ms. Chestnut's history and her age, she should receive screening for skin cancers, colon cancer, and gynecologic cancers.  The information and recommendations are listed on the patient's comprehensive care  plan/treatment summary and were reviewed in detail with the patient.    4. Health maintenance and wellness promotion: Ms. Decuir was encouraged to consume 5-7 servings of fruits and vegetables per day.  She was also encouraged to engage in moderate to vigorous exercise for 30 minutes per day most days of the week. We discussed the [employer] fitness program, which is designed for cancer survivors to help them become more physically fit after cancer treatments.  She was instructed to limit her alcohol consumption and continue to abstain from tobacco use.     5. Support services/counseling: It is not uncommon for this period of the patient's cancer care trajectory to be one of many emotions and stressors.  We discussed how this can be increasingly difficult during the times of quarantine and social distancing due to the COVID-19 pandemic.   She was given information regarding our available services and encouraged to contact me with any questions or for help enrolling in any of our support group/programs.    Follow up instructions:    -Return to cancer center in 6 months for f/u with Dr. Lindi Adie  -Mammogram due in 09/2021 -Bone Density due in 09/2022 -Follow up with Dr. Georgette Dover in 1 year -She is welcome to return back to the Survivorship Clinic at any time; no additional follow-up needed at this time.  -Consider referral back to survivorship as a long-term survivor for continued surveillance  The patient was provided an opportunity to ask questions and all were answered. The patient agreed with the plan and demonstrated an understanding of the instructions.   Total encounter time: 60 minutes in face to face visit time, chart review, lab review, order entry, care coordination and documentation of the encounter.    Wilber Bihari, NP 04/06/21 10:18 AM Medical Oncology and Hematology [facility] [place], [place] [address] Tel. [phone]    Fax.  [phone]  *Total Encounter Time as defined by the Centers for Medicare and Medicaid Services includes, in addition to the face-to-face time of a patient visit (documented in the note above) non-face-to-face time: obtaining and reviewing outside history, ordering and reviewing medications, tests or procedures, care coordination (communications with other health care professionals or caregivers) and documentation in the medical record.   [name]

## 2021-04-07 ENCOUNTER — Telehealth: Payer: Self-pay | Admitting: Cardiovascular Disease

## 2021-04-07 MED ORDER — WARFARIN SODIUM 2 MG PO TABS: ORAL_TABLET | ORAL | 0 refills | Status: DC

## 2021-04-07 NOTE — Telephone Encounter (Signed)
Please review for updated Rx. Thank you!

## 2021-04-07 NOTE — Telephone Encounter (Signed)
Pt c/o medication issue:  1. Name of Medication: warfarin (COUMADIN) 2 MG tablet  2. How are you currently taking this medication (dosage and times per day)?  TAKE ONE TABLET BY MOUTH daily EXCEPT 1 & 1/2 tablets ON Monday AND Thursdays OR as directed   3. Are you having a reaction (difficulty breathing--STAT)? no  4. What is your medication issue? [facility] is needing an updated rx to reflect how pt is currently taking medication which is  TAKE ONE TABLET BY MOUTH daily at bedtime please advise

## 2021-04-07 NOTE — Telephone Encounter (Signed)
Prescription refill request received for warfarin Lov: [name] 03/10/2021 Next INR check:10/17 Warfarin tablet strength: 2 mg   Refill sent.

## 2021-04-07 NOTE — Telephone Encounter (Signed)
Called and spoke to pt who verified that she takes warfarin the way Lattie Haw instructed her at last anti coag visit. Warfarin- 1 tablet daily.

## 2021-04-13 ENCOUNTER — Ambulatory Visit (HOSPITAL_BASED_OUTPATIENT_CLINIC_OR_DEPARTMENT_OTHER): Payer: Medicare Other | Admitting: Cardiovascular Disease

## 2021-04-13 DIAGNOSIS — I10 Essential (primary) hypertension: Secondary | ICD-10-CM | POA: Diagnosis not present

## 2021-04-13 DIAGNOSIS — G4733 Obstructive sleep apnea (adult) (pediatric): Secondary | ICD-10-CM | POA: Diagnosis not present

## 2021-04-15 ENCOUNTER — Telehealth: Payer: Self-pay | Admitting: Cardiovascular Disease

## 2021-04-15 MED ORDER — FUROSEMIDE 40 MG PO TABS: ORAL_TABLET | ORAL | 0 refills | Status: DC

## 2021-04-15 NOTE — Telephone Encounter (Signed)
*  STAT* If patient is at the pharmacy, call can be transferred to refill team.   1. Which medications need to be refilled? (please list name of each medication and dose if known)  furosemide (LASIX) 40 MG tablet  2. Which pharmacy/location (including street and city if local pharmacy) is medication to be sent to? [facility] - [place], [place] - [address]   3. Do they need a 30 day or 90 day supply?   90 day supply  

## 2021-04-20 ENCOUNTER — Ambulatory Visit (INDEPENDENT_AMBULATORY_CARE_PROVIDER_SITE_OTHER): Payer: Medicare Other | Admitting: *Deleted

## 2021-04-20 ENCOUNTER — Other Ambulatory Visit: Payer: Self-pay

## 2021-04-20 DIAGNOSIS — I82A12 Acute embolism and thrombosis of left axillary vein: Secondary | ICD-10-CM | POA: Diagnosis not present

## 2021-04-20 DIAGNOSIS — Z5181 Encounter for therapeutic drug level monitoring: Secondary | ICD-10-CM

## 2021-04-20 LAB — POCT INR: INR: 2 (ref 2.0–3.0)

## 2021-04-20 NOTE — Patient Instructions (Signed)
Continue warfarin 1 tablet daily.   Recheck INR in 6 weeks.  Call Coumadin clinic for any questions or changes in medications.  

## 2021-04-29 ENCOUNTER — Encounter (INDEPENDENT_AMBULATORY_CARE_PROVIDER_SITE_OTHER): Payer: Medicare Other | Admitting: Ophthalmology

## 2021-04-29 DIAGNOSIS — H401130 Primary open-angle glaucoma, bilateral, stage unspecified: Secondary | ICD-10-CM

## 2021-04-29 DIAGNOSIS — Z961 Presence of intraocular lens: Secondary | ICD-10-CM

## 2021-04-29 DIAGNOSIS — I10 Essential (primary) hypertension: Secondary | ICD-10-CM

## 2021-04-29 DIAGNOSIS — H3581 Retinal edema: Secondary | ICD-10-CM

## 2021-04-29 DIAGNOSIS — H353122 Nonexudative age-related macular degeneration, left eye, intermediate dry stage: Secondary | ICD-10-CM

## 2021-04-29 DIAGNOSIS — H26491 Other secondary cataract, right eye: Secondary | ICD-10-CM

## 2021-04-29 DIAGNOSIS — H353211 Exudative age-related macular degeneration, right eye, with active choroidal neovascularization: Secondary | ICD-10-CM

## 2021-04-29 DIAGNOSIS — E119 Type 2 diabetes mellitus without complications: Secondary | ICD-10-CM

## 2021-04-29 DIAGNOSIS — H35033 Hypertensive retinopathy, bilateral: Secondary | ICD-10-CM

## 2021-04-30 NOTE — Progress Notes (Signed)
[facility] Note  05/04/2021     CHIEF COMPLAINT Patient presents for Retina Follow Up   HISTORY OF PRESENT ILLNESS: [name] is a 80 y.o. female who presents to the clinic today for:  HPI     Retina Follow Up   Patient presents with  Wet AMD.  In right eye.  Duration of 9 weeks.  Since onset it is stable.  I, the attending physician,  performed the HPI with the patient and updated documentation appropriately.        Comments   9 week follow up Exu ARMD OD- At times vision is better than others. O/w stable.  Cosopt BID OD, Latanoprost qhs OU, Systane Gel qhs OD, Systane gtts prn OU. Patient started a C-pap since last visit.      Last edited by Bernarda Caffey, MD on 05/04/2021  4:38 PM.    Patient states "at times" she has problems with her vision, but usually only at night  Referring physician: Sharilyn Sites, MD [address] [place],  [place] [address]  HISTORICAL INFORMATION:  Selected notes from the [facility] Referred by Dr. Madelin Headings for concern of SRF OD LEE: 11.27.20 (M. Cotter) [BCVA: OD: 20/80-- OS: 20/60-]  Ocular Hx-glaucoma (latanoprost)  PMH-DM    CURRENT MEDICATIONS: Current Outpatient Medications (Ophthalmic Drugs)  Medication Sig   dorzolamide-timolol (COSOPT) 22.3-6.8 MG/ML ophthalmic solution INSTILL 1 DROP INTO RIGHT EYE TWICE A DAY (Patient taking differently: Place 1 drop into the right eye 2 (two) times daily.)   latanoprost (XALATAN) 0.005 % ophthalmic solution Place 1 drop into both eyes at bedtime.    tetrahydrozoline 0.05 % ophthalmic solution Place 1 drop into both eyes at bedtime.   No current facility-administered medications for this visit. (Ophthalmic Drugs)   Current Outpatient Medications (Other)  Medication Sig   albuterol (PROVENTIL HFA;VENTOLIN HFA) 108 (90 BASE) MCG/ACT inhaler Inhale 2 puffs into the lungs every 4 (four) hours as needed for shortness of breath.    amitriptyline (ELAVIL) 25 MG tablet Take 25 mg by mouth at bedtime.   anastrozole (ARIMIDEX) 1 MG tablet Take 1 tablet (1 mg total) by mouth daily.   Black Cohosh 40 MG CAPS Take 40 mg by mouth 2 (two) times daily.   carvedilol (COREG) 6.25 MG tablet Take 1 tablet (6.25 mg total) by mouth 2 (two) times daily.   cetirizine (ZYRTEC) 10 MG tablet Take 10 mg by mouth daily.   Cholecalciferol (VITAMIN D-3) 1000 units CAPS Take 1,000 Units by mouth daily.   diazepam (VALIUM) 2 MG tablet Take 2 mg by mouth 2 (two) times daily as needed for anxiety (dizziness).   esomeprazole (NEXIUM) 20 MG capsule Take 20 mg by mouth daily at 12 noon.   FARXIGA 5 MG TABS tablet Take 5 mg by mouth every morning.   furosemide (LASIX) 40 MG tablet TAKE 1 TABLET (40 MG TOTAL) BY MOUTH DAILY. MAY TAKE EXTRA DAILY AS NEEDED FOR SWELLING   gabapentin (NEURONTIN) 300 MG capsule Take 300 mg by mouth 3 (three) times daily.   glimepiride (AMARYL) 2 MG tablet Take 2 mg by mouth daily.   hydrALAZINE (APRESOLINE) 25 MG tablet Take 1 tablet (25 mg total) by mouth in the morning and at bedtime.   isosorbide mononitrate (IMDUR) 30 MG 24 hr tablet Take 1 tablet (30 mg total) by mouth daily.   meclizine (ANTIVERT) 25 MG tablet Take 25 mg by mouth 2 (two) times daily as  needed for dizziness.   Multiple Vitamins-Minerals (PRESERVISION AREDS 2 PO) Take 1 capsule by mouth in the morning and at bedtime.   Omega-3 Fatty Acids (FISH OIL) 1200 MG CAPS Take 1,200 mg by mouth 2 (two) times daily.   polyethylene glycol (MIRALAX / GLYCOLAX) packet Take 17 g by mouth daily.   potassium chloride SA (KLOR-CON) 20 MEQ tablet Take 0.5 tablets (10 mEq total) by mouth daily.   TRADJENTA 5 MG TABS tablet Take 5 mg by mouth daily.   vitamin B-12 (CYANOCOBALAMIN) 100 MCG tablet Take 100 mcg by mouth daily.   warfarin (COUMADIN) 2 MG tablet TAKE ONE TABLET BY MOUTH EVERYDAY AT BEDTIME   No current facility-administered medications for this visit. (Other)    REVIEW OF SYSTEMS: ROS   Positive for: Genitourinary, Endocrine, Cardiovascular, Eyes Negative for: Constitutional, Gastrointestinal, Neurological, Skin, Musculoskeletal, HENT, Respiratory, Psychiatric, Allergic/Imm, Heme/Lymph Last edited by Leonie Douglas, COA on 05/04/2021  1:32 PM.     ALLERGIES Allergies  Allergen Reactions   Alphagan [Brimonidine] Itching   Diflunisal Swelling    Other reaction(s): ENTIRE BODY SWELLING   Vioxx [Rofecoxib] Shortness Of Breath   Metformin And Related     Kidney failure   Nexlizet [Bempedoic Acid-Ezetimibe]     Causes elevated Liver and Kidney function   Pravastatin    Repatha [Evolocumab]     MYALGIAS   Codeine Rash   Elemental Sulfur Rash   Motrin [Ibuprofen] Rash   Penicillins Rash    PAST MEDICAL HISTORY Past Medical History:  Diagnosis Date   Antral gastritis    EGD 11/15   Asthmatic bronchitis    Back pain    CAD in native artery 03/10/2021   Chronic diastolic heart failure ([place]) 05/20/2015   Grade 2 diastolic dysfunction.  04/2015.   Chronic kidney disease    kidney function low   Diabetes mellitus    x 5 yrs   DVT of axillary vein, acute left (HCC) 07/24/12   GERD (gastroesophageal reflux disease)    Glaucoma    POAG OU   Heart murmur    History of hiatal hernia    History of kidney stones    Hyperlipidemia 05/20/2015   Hypertension    Hypertensive retinopathy    OU   Hypothyroidism    Kidney stones    Macular degeneration    Wet OD, Dry OS   Mixed hyperlipidemia    Peripheral venous insufficiency    Pinched nerve    right elbow   Pneumonia    Sigmoid diverticulitis    Snoring 03/10/2021   Vertigo    chonic   Past Surgical History:  Procedure Laterality Date   ABDOMINAL HYSTERECTOMY     BACK SURGERY     spinal    BREAST LUMPECTOMY WITH RADIOACTIVE SEED LOCALIZATION Right 12/03/2020   Procedure: RIGHT BREAST LUMPECTOMY WITH RADIOACTIVE SEED LOCALIZATION;  Surgeon: Donnie Mesa, MD;  Location: [facility];   Service: General;  Laterality: Right;   CARDIAC CATHETERIZATION N/A 05/26/2015   Procedure: Left Heart Cath and Coronary Angiography;  Surgeon: Jettie Booze, MD;  Location: [facility];  Service: Cardiovascular;  Laterality: N/A;   CATARACT EXTRACTION Bilateral    CHOLECYSTECTOMY     COLON SURGERY     COLONOSCOPY N/A 12/27/2013   Procedure: COLONOSCOPY;  Surgeon: Rogene Houston, MD;  [facility];  Service: Endoscopy;  Laterality: N/A;  200   COLOSTOMY CLOSURE     ESOPHAGOGASTRODUODENOSCOPY N/A 05/07/2014  Procedure: ESOPHAGOGASTRODUODENOSCOPY (EGD);  Surgeon: Rogene Houston, MD;  [facility];  Service: Endoscopy;  Laterality: N/A;   EYE SURGERY Bilateral    Cat Sx   fracture left foot     HERNIA REPAIR     NM MYOCAR PERF WALL MOTION  01/28/2009   Normal   OTHER SURGICAL HISTORY     colostomy, colostomy reversal, for diverticulitis surgical hernia repair, arm surgery, neck surgery   US ECHOCARDIOGRAPHY  02/11/2010   Mild MR,trace TR & AI   FAMILY HISTORY Family History  Problem Relation Age of Onset   Other Mother 37       Cause unknown   Cancer Mother        liver   Heart attack Brother 29       deceased   CVA Maternal Grandmother 90       deceased   Heart disease Maternal Grandmother    Stroke Maternal Grandmother    Breast cancer Maternal Grandmother    Glaucoma Maternal Uncle    Bone cancer Maternal Uncle    Leukemia Maternal Aunt    Breast cancer Sister    SOCIAL HISTORY Social History   Tobacco Use   Smoking status: Former    Types: Cigarettes    Quit date: 12/10/2013    Years since quitting: 7.4   Smokeless tobacco: Never   Tobacco comments:    Smoke 1-1 1/2 packs a day  Vaping Use   Vaping Use: Never used  Substance Use Topics   Alcohol use: No   Drug use: No       OPHTHALMIC EXAM: Base Eye Exam     Visual Acuity (Snellen - Linear)       Right Left   Dist [place] 20/40 20/30   Dist ph  20/25 20/25 -2          Tonometry (Tonopen, 1:44 PM)       Right Left   Pressure 18 13         Pupils       Dark Light Shape React APD   Right 2 1 Round Minimal None   Left 2 1 Round Minimal None         Visual Fields (Counting fingers)       Left Right    Full Full         Extraocular Movement       Right Left    Full Full         Neuro/Psych     Oriented x3: Yes   Mood/Affect: Normal         Dilation     Both eyes: 1.0% Mydriacyl, 2.5% Phenylephrine @ 1:45 PM           Slit Lamp and Fundus Exam     Slit Lamp Exam       Right Left   Lids/Lashes Dermatochalasis - upper lid, mild Meibomian gland dysfunction Dermatochalasis - upper lid, Telangiectasia, mild Meibomian gland dysfunction   Conjunctiva/Sclera White and quiet White and quiet   Cornea 3+Punctate epithelial erosions, mild arcus, central haze, well healed cataract wound 1-2+ fine Punctate epithelial erosions, central haze   Anterior Chamber Deep and quiet, narrow temporal angle Deep and quiet, narrow temporal angle   Iris Round and dilated Round and moderately dilated to 5.28mm   Lens Posterior chamber intraocular lens, open PC Posterior chamber intraocular lens   Vitreous Vitreous syneresis, Posterior vitreous detachment Vitreous syneresis  Fundus Exam       Right Left   Disc Sharp rim, Pallor, +cupping, mild, temporal Peripapillary atrophy, superior rim thinning Pink and Sharp, temporal Peripapillary atrophy   C/D Ratio 0.75 0.5   Macula Flat, Blunted foveal reflex, +focal CNV/PED nasal macula with partial pigment ring, stable improvement in SRF -- trace cystic changes, Drusen, no heme, RPE mottling and clumping Flat, Blunted foveal reflex, drusen, trace ERM, Retinal pigment epithelial mottling and clumping, No heme or edema   Vessels Vascular attenuation, Tortuous Vascular attenuation, Tortuous   Periphery Attached, mild reticular degeneration, No heme, No RT/RD Attached; no heme            IMAGING AND PROCEDURES  Imaging and Procedures for @TODAY @  OCT, Retina - OU - Both Eyes       Right Eye Quality was good. Central Foveal Thickness: 195. Progression has been stable. Findings include intraretinal hyper-reflective material, pigment epithelial detachment, subretinal hyper-reflective material, retinal drusen , normal foveal contour, outer retinal atrophy, no IRF, subretinal fluid (stable improvement in SRF overlying nasal PED -- trace cystic changes, partial PVD).   Left Eye Quality was good. Central Foveal Thickness: 208. Progression has been stable. Findings include normal foveal contour, no IRF, no SRF, retinal drusen (Partial PVD).   Notes *Images captured and stored on drive  Diagnosis / Impression:  OD: exudative ARMD; stable improvement in SRF overlying nasal PED -- trace cystic changes, partial PVD OS: NFP, no IRF/SRF; +drusen -- nonexudative ARMD  Clinical management:  See below  Abbreviations: NFP - Normal foveal profile. CME - cystoid macular edema. PED - pigment epithelial detachment. IRF - intraretinal fluid. SRF - subretinal fluid. EZ - ellipsoid zone. ERM - epiretinal membrane. ORA - outer retinal atrophy. ORT - outer retinal tubulation. SRHM - subretinal hyper-reflective material      Intravitreal Injection, Pharmacologic Agent - OD - Right Eye       Time Out 05/04/2021. 2:05 PM. Confirmed correct patient, procedure, site, and patient consented.   Anesthesia Topical anesthesia was used. Anesthetic medications included Lidocaine 2%, Proparacaine 0.5%.   Procedure Preparation included 5% betadine to ocular surface, eyelid speculum. A supplied needle was used.   Injection: 1.25 mg Bevacizumab 1.25mg /0.21ml   Route: Intravitreal, Site: Right Eye   NDC: H061816, Lot: [id number], Expiration date: 06/17/2021, Waste: 0 mL   Post-op Post injection exam found visual acuity of at least counting fingers. The patient tolerated the procedure well.  There were no complications. The patient received written and verbal post procedure care education. Post injection medications were not given.             ASSESSMENT/PLAN:   ICD-10-CM   1. Exudative age-related macular degeneration of right eye with active choroidal neovascularization (HCC)  H35.3211 Intravitreal Injection, Pharmacologic Agent - OD - Right Eye    Bevacizumab (AVASTIN) SOLN 1.25 mg    2. Retinal edema  H35.81 OCT, Retina - OU - Both Eyes    3. Intermediate stage nonexudative age-related macular degeneration of left eye  H35.3122     4. Diabetes mellitus type 2 without retinopathy ([place])  E11.9     5. Essential hypertension  I10     6. Hypertensive retinopathy of both eyes  H35.033     7. Pseudophakia of both eyes  Z96.1     8. PCO (posterior capsular opacification), right  H26.491     9. Primary open angle glaucoma of both eyes, unspecified glaucoma stage  H40.1130  1,2. 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/ DCIS  - h/o delayed follow up from 4 weeks to 8 weeks due to passing of husband (12.11.20-02.12.21)  - s/p IVA OD #1 (12.11.20), #2 (02.12.21), #3 (03.12.21), #4 (04.09.21), #5 (05.14.21), #6 (06.17.21), #7 (07.23.21), #8 (10.15.21), #9 (12.7.21), #10 (03.22.22), #11 (06.20.22), #12 (08.24.22)  - FA 12.11.20 confirms +CNVM  - OCT today shows stable improvement in SRF overlying nasal PED -- trace cystic changes at 9 wks, partial PVD  - BCVA OD 20/25 (stable)   - recommend IVA OD #12 today, 10.31.22 -- maintenance w f/u in 9 wks  - pt wishes to proceed  - RBA of procedure discussed, questions answered - informed consent obtained and signed - see procedure note - Avastin informed consent form signed and scanned on 12.11.2020 (OD)             - Continue to use AT OD  - f/u 9 weeks -- DFE/OCT/possible injection; treat and extend as able  3. Age related macular  degeneration, non-exudative, OS  - intermediate stage  - The incidence, anatomy, and pathology of dry AMD, risk of progression, and the AREDS and AREDS 2 study including smoking risks discussed with patient.  - recommend Amsler grid monitoring  4. Diabetes mellitus, type 2 without retinopathy  - The incidence, risk factors for progression, natural history and treatment options for diabetic retinopathy  were discussed with patient.    - The need for close monitoring of blood glucose, blood pressure, and serum lipids, avoiding cigarette or any type of tobacco, and the need for long term follow up was also discussed with patient.  - monitor  5,6. Hypertensive retinopathy OU  - discussed importance of tight BP control  - monitor  7,8. Pseudophakia OU, PCO OD  - s/p CE/IOL OU (Dr. Venetia Maxon)  - IOL in good position  - s/p yag cap OD (04.06.22) -- good PC opening  - monitor  9. POAG OU  - formerly managed by Dr. Venetia Maxon  - s/p laser w/ Dr. Venetia Maxon -- ?SLT  - IOP 18,13  - currently on latanoprost QHS OU  - cont Cosopt BID OD only  - monitor  Ophthalmic Meds Ordered this visit:  Meds ordered this encounter  Medications   Bevacizumab (AVASTIN) SOLN 1.25 mg     Return in about 9 weeks (around 07/06/2021) for f/u exu ARMD OD, DFE, OCT.  There are no Patient Instructions on file for this visit.  This document serves as a record of services personally performed by Gardiner Sleeper, MD, PhD. It was created on their behalf by Leeann Must, [place], an ophthalmic technician. The creation of this record is the provider's dictation and/or activities during the visit.    Electronically signed by: Leeann Must, COA @TODAY @ 4:41 PM  This document serves as a record of services personally performed by Gardiner Sleeper, MD, PhD. It was created on their behalf by San Jetty. Owens Shark, OA an ophthalmic technician. The creation of this record is the provider's dictation and/or activities during the visit.     Electronically signed by: San Jetty. Owens Shark, [place] 10.31.2022 4:41 PM  Gardiner Sleeper, M.D., Ph.D. Diseases & Surgery of the Retina and [place] 05/04/2021   I have reviewed the above documentation for accuracy and completeness, and I agree with the above. Gardiner Sleeper, M.D.,  Ph.D. 05/04/21 4:41 PM  Abbreviations: M myopia (nearsighted); A astigmatism; H hyperopia (farsighted); P presbyopia; Mrx spectacle prescription;  CTL contact lenses; OD right eye; OS left eye; OU both eyes  XT exotropia; ET esotropia; PEK punctate epithelial keratitis; PEE punctate epithelial erosions; DES dry eye syndrome; MGD meibomian gland dysfunction; ATs artificial tears; PFAT's preservative free artificial tears; [place] nuclear sclerotic cataract; PSC posterior subcapsular cataract; ERM epi-retinal membrane; PVD posterior vitreous detachment; RD retinal detachment; DM diabetes mellitus; DR diabetic retinopathy; NPDR non-proliferative diabetic retinopathy; PDR proliferative diabetic retinopathy; CSME clinically significant macular edema; DME diabetic macular edema; dbh dot blot hemorrhages; CWS cotton wool spot; POAG primary open angle glaucoma; C/D cup-to-disc ratio; HVF humphrey visual field; GVF goldmann visual field; OCT optical coherence tomography; IOP intraocular pressure; BRVO Branch retinal vein occlusion; CRVO central retinal vein occlusion; CRAO central retinal artery occlusion; BRAO branch retinal artery occlusion; RT retinal tear; SB scleral buckle; PPV pars plana vitrectomy; VH Vitreous hemorrhage; PRP panretinal laser photocoagulation; IVK intravitreal kenalog; VMT vitreomacular traction; MH Macular hole;  NVD neovascularization of the disc; NVE neovascularization elsewhere; AREDS age related eye disease study; ARMD age related macular degeneration; POAG primary open angle glaucoma; EBMD epithelial/anterior basement membrane dystrophy; ACIOL anterior chamber intraocular lens;  IOL intraocular lens; PCIOL posterior chamber intraocular lens; Phaco/IOL phacoemulsification with intraocular lens placement; [place] photorefractive keratectomy; LASIK laser assisted in situ keratomileusis; HTN hypertension; DM diabetes mellitus; COPD chronic obstructive pulmonary disease

## 2021-05-04 ENCOUNTER — Other Ambulatory Visit: Payer: Self-pay

## 2021-05-04 ENCOUNTER — Other Ambulatory Visit: Payer: Self-pay | Admitting: Cardiovascular Disease

## 2021-05-04 ENCOUNTER — Ambulatory Visit (INDEPENDENT_AMBULATORY_CARE_PROVIDER_SITE_OTHER): Payer: Medicare Other | Admitting: Ophthalmology

## 2021-05-04 ENCOUNTER — Encounter (INDEPENDENT_AMBULATORY_CARE_PROVIDER_SITE_OTHER): Payer: Self-pay | Admitting: Ophthalmology

## 2021-05-04 DIAGNOSIS — H35033 Hypertensive retinopathy, bilateral: Secondary | ICD-10-CM

## 2021-05-04 DIAGNOSIS — H353211 Exudative age-related macular degeneration, right eye, with active choroidal neovascularization: Secondary | ICD-10-CM | POA: Diagnosis not present

## 2021-05-04 DIAGNOSIS — H26491 Other secondary cataract, right eye: Secondary | ICD-10-CM | POA: Diagnosis not present

## 2021-05-04 DIAGNOSIS — H401130 Primary open-angle glaucoma, bilateral, stage unspecified: Secondary | ICD-10-CM | POA: Diagnosis not present

## 2021-05-04 DIAGNOSIS — H3581 Retinal edema: Secondary | ICD-10-CM

## 2021-05-04 DIAGNOSIS — E1165 Type 2 diabetes mellitus with hyperglycemia: Secondary | ICD-10-CM | POA: Diagnosis not present

## 2021-05-04 DIAGNOSIS — H353122 Nonexudative age-related macular degeneration, left eye, intermediate dry stage: Secondary | ICD-10-CM | POA: Diagnosis not present

## 2021-05-04 DIAGNOSIS — Z961 Presence of intraocular lens: Secondary | ICD-10-CM | POA: Diagnosis not present

## 2021-05-04 DIAGNOSIS — I10 Essential (primary) hypertension: Secondary | ICD-10-CM

## 2021-05-04 DIAGNOSIS — I2510 Atherosclerotic heart disease of native coronary artery without angina pectoris: Secondary | ICD-10-CM | POA: Diagnosis not present

## 2021-05-04 DIAGNOSIS — E119 Type 2 diabetes mellitus without complications: Secondary | ICD-10-CM

## 2021-05-04 IMAGING — MG MM DIGITAL SCREENING BILAT W/ TOMO AND CAD
4 series · 4 of 12 positions shown · non-contrast
Comparison: Previous exam(s).

CLINICAL DATA: Screening.

EXAM:
DIGITAL SCREENING BILATERAL MAMMOGRAM WITH TOMOSYNTHESIS AND CAD
TECHNIQUE: Bilateral screening digital craniocaudal and mediolateral oblique
mammograms were obtained. Bilateral screening digital breast
tomosynthesis was performed. The images were evaluated with
computer-aided detection.

[R CC synth-2D]
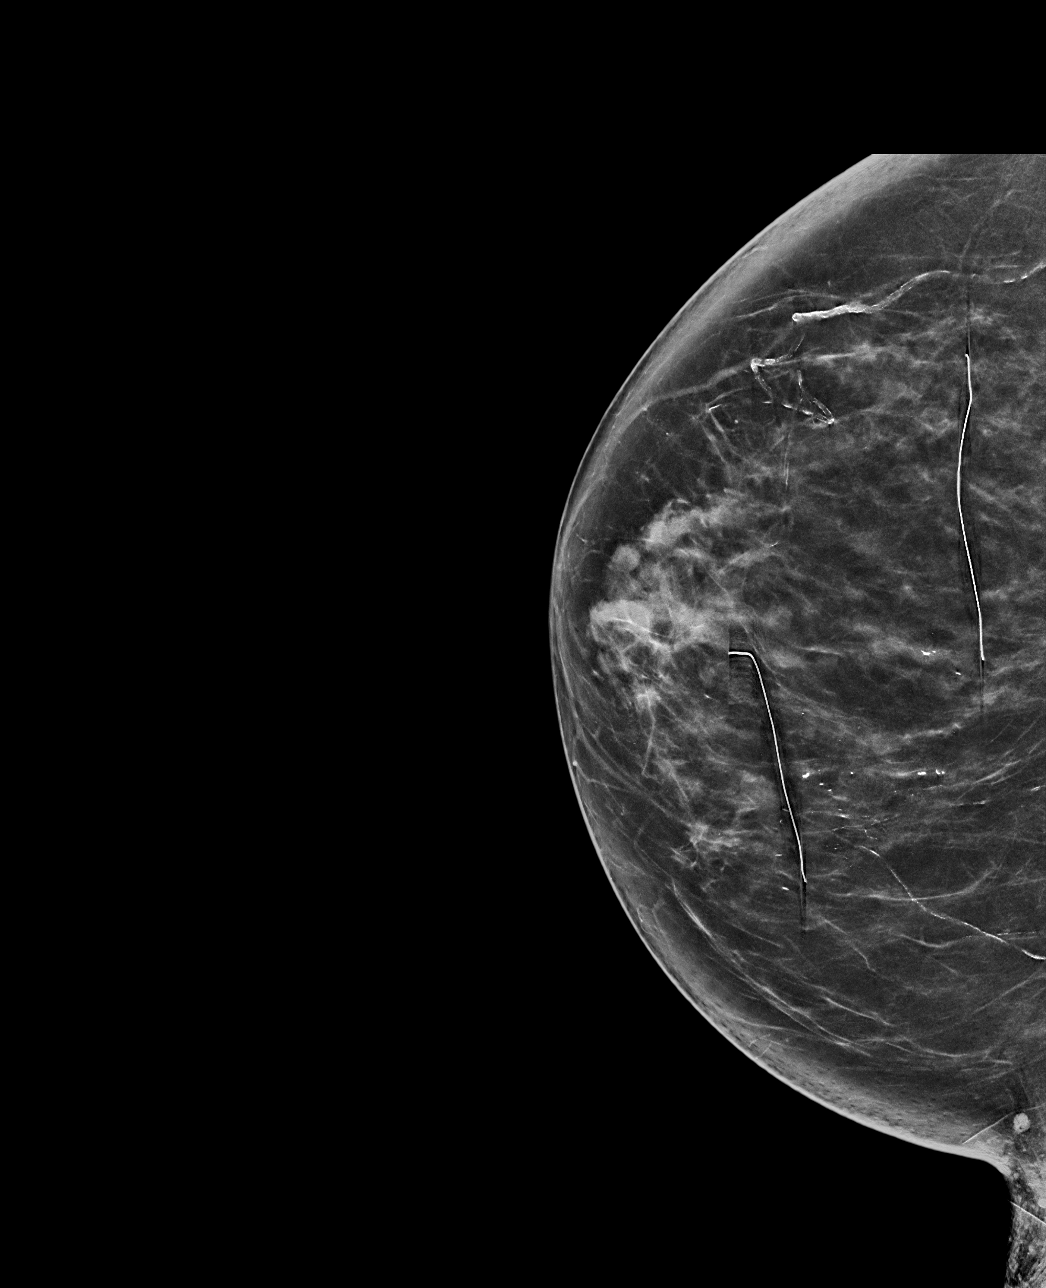

[R MLO synth-2D]
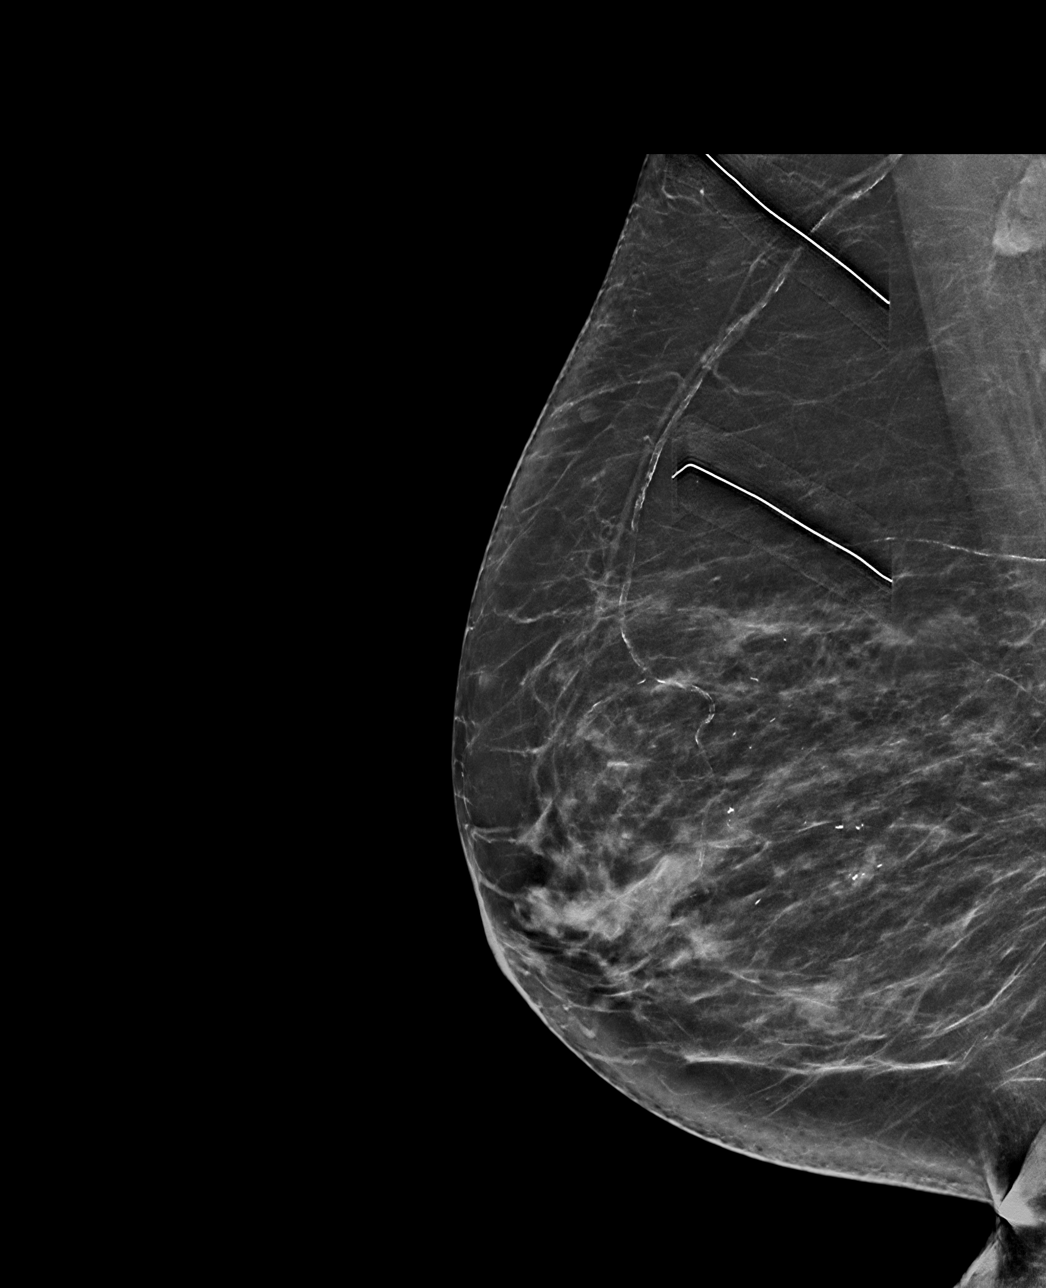

[R CC tomo · tomo slice 41/82.0]
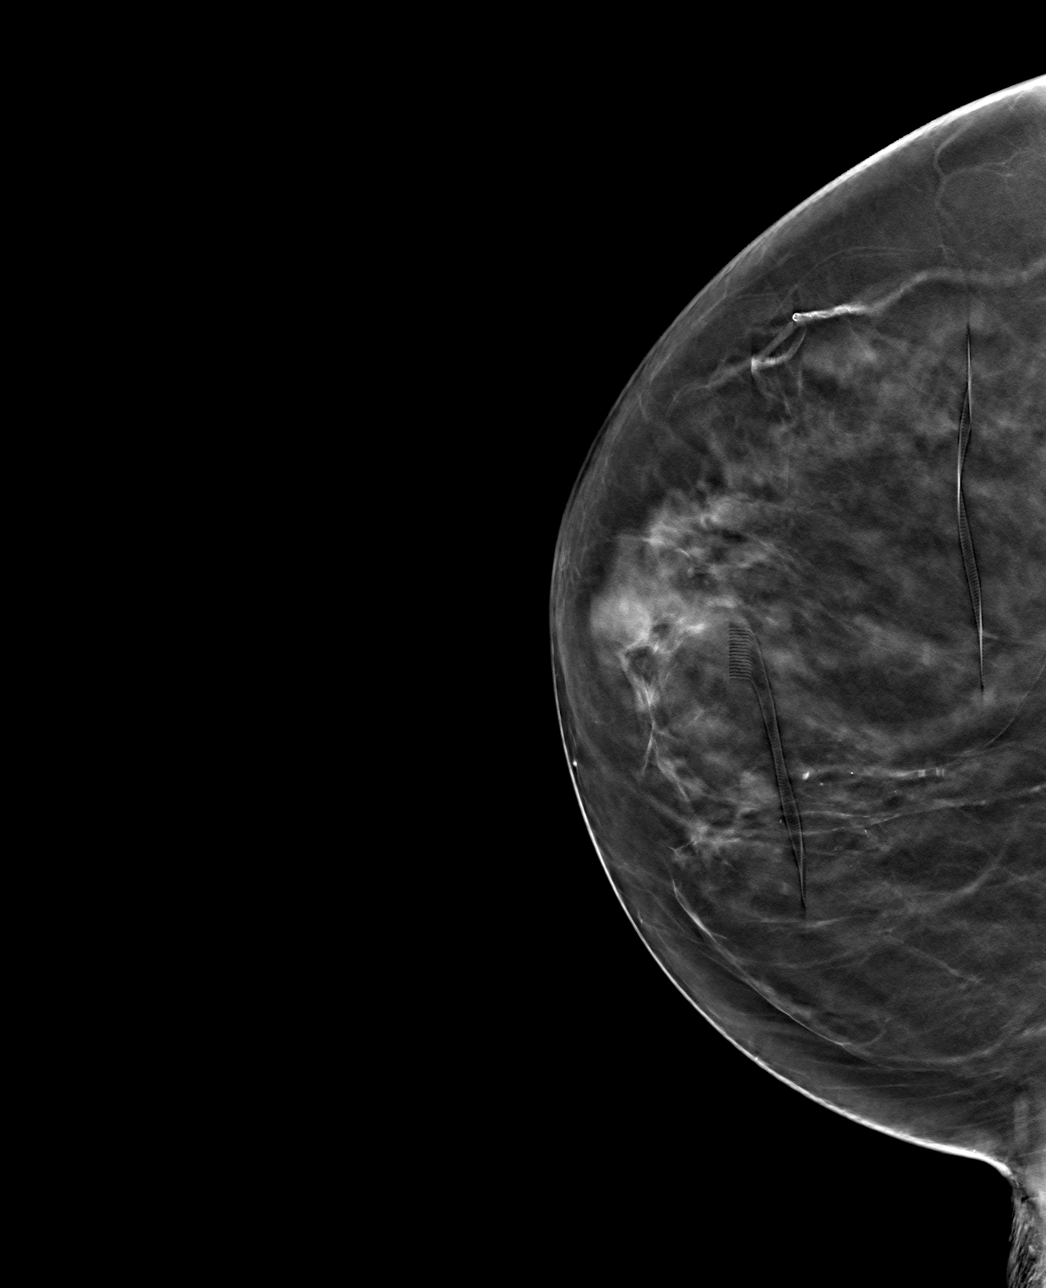

[L CC tomo · tomo slice 41/80.0]
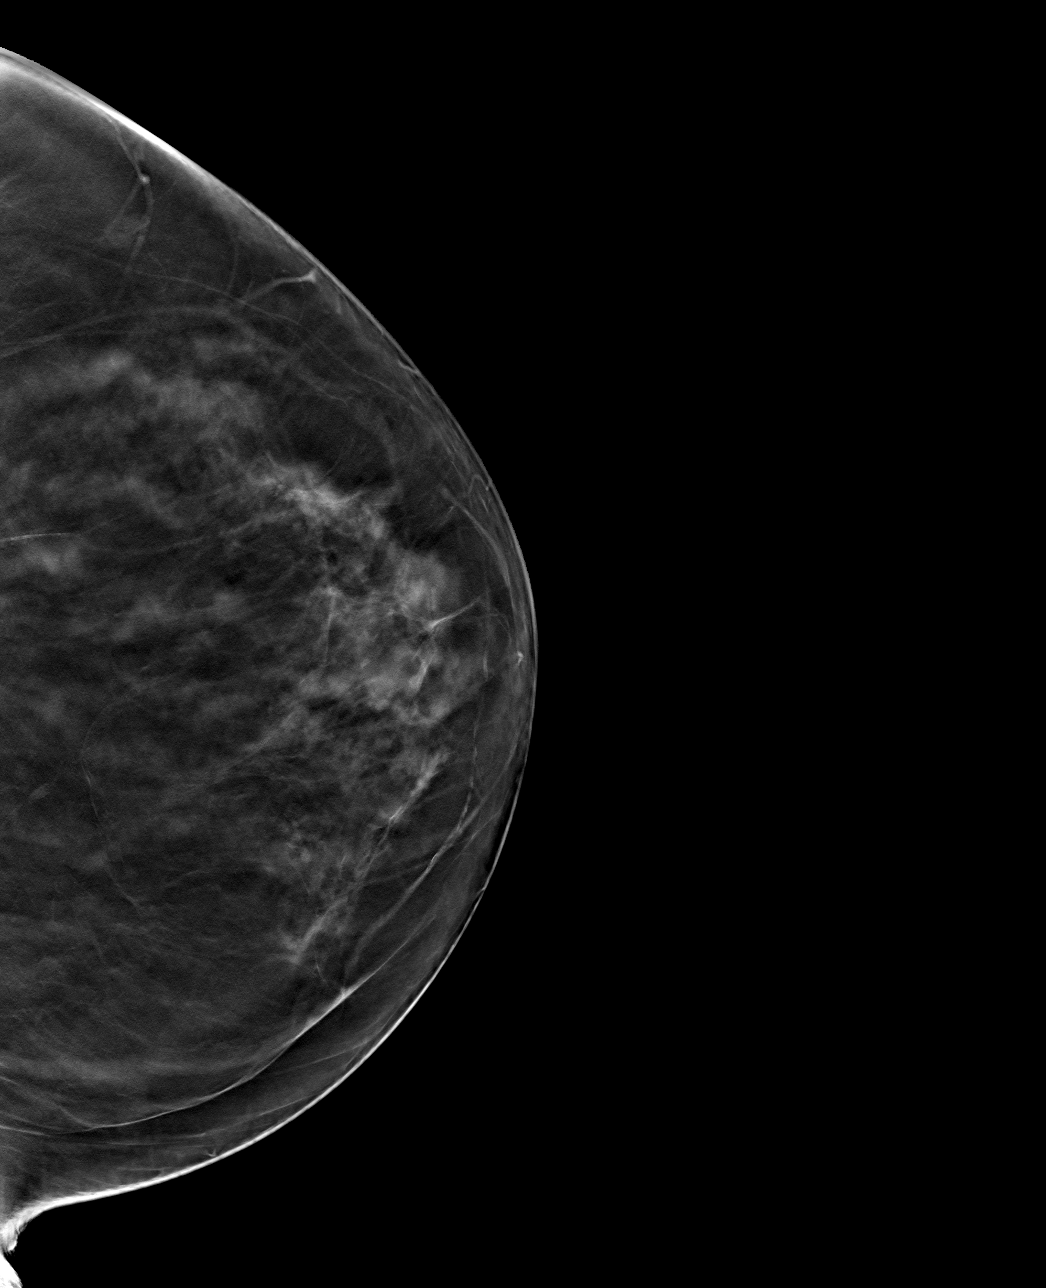

[4 of 12 positions shown; findings below may reference images not displayed]

ACR Breast Density Category b: There are scattered areas of
fibroglandular density.
FINDINGS: In the right breast, calcifications and a possible mass warrant
further evaluation. In the left breast, no findings suspicious for
malignancy.
IMPRESSION: Further evaluation is suggested for a possible mass and
calcifications in the right breast.

RECOMMENDATION:
Diagnostic mammogram and possibly ultrasound of the right breast.
(Code:[id number])

The patient will be contacted regarding the findings, and additional
imaging will be scheduled.

BI-RADS CATEGORY  0: Incomplete. Need additional imaging evaluation
and/or prior mammograms for comparison.

## 2021-05-04 MED ORDER — BEVACIZUMAB CHEMO INJECTION 1.25MG/0.05ML SYRINGE FOR KALEIDOSCOPE
1.2500 mg | INTRAVITREAL | Status: AC | PRN
  Administered 2021-05-04: 1.25 mg via INTRAVITREAL

## 2021-05-06 DIAGNOSIS — J069 Acute upper respiratory infection, unspecified: Secondary | ICD-10-CM | POA: Diagnosis not present

## 2021-05-14 DIAGNOSIS — G4733 Obstructive sleep apnea (adult) (pediatric): Secondary | ICD-10-CM | POA: Diagnosis not present

## 2021-05-14 DIAGNOSIS — I10 Essential (primary) hypertension: Secondary | ICD-10-CM | POA: Diagnosis not present

## 2021-05-20 DIAGNOSIS — D34 Benign neoplasm of thyroid gland: Secondary | ICD-10-CM | POA: Diagnosis not present

## 2021-05-20 DIAGNOSIS — D249 Benign neoplasm of unspecified breast: Secondary | ICD-10-CM | POA: Diagnosis not present

## 2021-05-20 DIAGNOSIS — I10 Essential (primary) hypertension: Secondary | ICD-10-CM | POA: Diagnosis not present

## 2021-05-20 DIAGNOSIS — E1165 Type 2 diabetes mellitus with hyperglycemia: Secondary | ICD-10-CM | POA: Diagnosis not present

## 2021-05-20 DIAGNOSIS — E042 Nontoxic multinodular goiter: Secondary | ICD-10-CM | POA: Diagnosis not present

## 2021-05-22 ENCOUNTER — Telehealth (HOSPITAL_BASED_OUTPATIENT_CLINIC_OR_DEPARTMENT_OTHER): Payer: Self-pay | Admitting: Cardiovascular Disease

## 2021-05-22 MED ORDER — POTASSIUM CHLORIDE CRYS ER 20 MEQ PO TBCR: 10.0000 meq | EXTENDED_RELEASE_TABLET | Freq: Every day | ORAL | 1 refills | Status: DC

## 2021-05-22 MED ORDER — CARVEDILOL 6.25 MG PO TABS: 6.2500 mg | ORAL_TABLET | Freq: Two times a day (BID) | ORAL | 1 refills | Status: DC

## 2021-05-22 MED ORDER — ISOSORBIDE MONONITRATE ER 30 MG PO TB24: 30.0000 mg | ORAL_TABLET | Freq: Every day | ORAL | 1 refills | Status: DC

## 2021-05-22 MED ORDER — HYDRALAZINE HCL 25 MG PO TABS
25.0000 mg | ORAL_TABLET | Freq: Two times a day (BID) | ORAL | 1 refills | Status: DC
Start: 2021-05-22 — End: 2021-11-02

## 2021-05-22 NOTE — Telephone Encounter (Signed)
Received fax from [facility] stating patient has switched to this pharmacy and needs the following refills sent:  Carvedilol 6.25 mg Hydralazine 25 mg Isosorbide Mononitrate 30 mg Potassium Chloride 20 mEq  Rx request sent to pharmacy.

## 2021-05-27 DIAGNOSIS — J449 Chronic obstructive pulmonary disease, unspecified: Secondary | ICD-10-CM | POA: Diagnosis not present

## 2021-05-27 DIAGNOSIS — H6593 Unspecified nonsuppurative otitis media, bilateral: Secondary | ICD-10-CM | POA: Diagnosis not present

## 2021-05-27 DIAGNOSIS — J069 Acute upper respiratory infection, unspecified: Secondary | ICD-10-CM | POA: Diagnosis not present

## 2021-05-28 ENCOUNTER — Other Ambulatory Visit: Payer: Self-pay | Admitting: Cardiovascular Disease

## 2021-06-01 ENCOUNTER — Ambulatory Visit (INDEPENDENT_AMBULATORY_CARE_PROVIDER_SITE_OTHER): Payer: Medicare Other | Admitting: *Deleted

## 2021-06-01 DIAGNOSIS — I82A12 Acute embolism and thrombosis of left axillary vein: Secondary | ICD-10-CM | POA: Diagnosis not present

## 2021-06-01 DIAGNOSIS — Z5181 Encounter for therapeutic drug level monitoring: Secondary | ICD-10-CM | POA: Diagnosis not present

## 2021-06-01 LAB — POCT INR: INR: 2.9 (ref 2.0–3.0)

## 2021-06-01 NOTE — Patient Instructions (Signed)
Continue warfarin 1 tablet daily.  Eat extra greens/salads while on Prednisone.  Decreased prednisone to 40mg  today.  Recheck INR in 2 weeks. Call Coumadin clinic for any questions or changes in medications.

## 2021-06-03 DIAGNOSIS — I10 Essential (primary) hypertension: Secondary | ICD-10-CM | POA: Diagnosis not present

## 2021-06-03 DIAGNOSIS — I2510 Atherosclerotic heart disease of native coronary artery without angina pectoris: Secondary | ICD-10-CM | POA: Diagnosis not present

## 2021-06-03 DIAGNOSIS — E1165 Type 2 diabetes mellitus with hyperglycemia: Secondary | ICD-10-CM | POA: Diagnosis not present

## 2021-06-04 DIAGNOSIS — N200 Calculus of kidney: Secondary | ICD-10-CM | POA: Diagnosis not present

## 2021-06-04 DIAGNOSIS — E7800 Pure hypercholesterolemia, unspecified: Secondary | ICD-10-CM | POA: Diagnosis not present

## 2021-06-04 DIAGNOSIS — D34 Benign neoplasm of thyroid gland: Secondary | ICD-10-CM | POA: Diagnosis not present

## 2021-06-04 DIAGNOSIS — E042 Nontoxic multinodular goiter: Secondary | ICD-10-CM | POA: Diagnosis not present

## 2021-06-04 DIAGNOSIS — M5136 Other intervertebral disc degeneration, lumbar region: Secondary | ICD-10-CM | POA: Diagnosis not present

## 2021-06-04 DIAGNOSIS — E1165 Type 2 diabetes mellitus with hyperglycemia: Secondary | ICD-10-CM | POA: Diagnosis not present

## 2021-06-04 DIAGNOSIS — M5030 Other cervical disc degeneration, unspecified cervical region: Secondary | ICD-10-CM | POA: Diagnosis not present

## 2021-06-04 DIAGNOSIS — I10 Essential (primary) hypertension: Secondary | ICD-10-CM | POA: Diagnosis not present

## 2021-06-08 DIAGNOSIS — H6121 Impacted cerumen, right ear: Secondary | ICD-10-CM | POA: Diagnosis not present

## 2021-06-08 DIAGNOSIS — E118 Type 2 diabetes mellitus with unspecified complications: Secondary | ICD-10-CM | POA: Diagnosis not present

## 2021-06-09 NOTE — Progress Notes (Signed)
Cardiology Office Note   Date:  06/10/2021   ID:  [name], DOB 1940-12-06, MRN [id number]  PCP:  Sharilyn Sites, MD  Cardiologist:   Skeet Latch, MD   Chief Complaint  Patient presents with   Follow-up      History of Present Illness: [name] is a 80 y.o. female with CAD (50% LAD), chronic diastolic heart failure (grade 2), hypertension, hyperlipidemia, prior DVT on warfarin, breast cancer s/p lumpectomy and XRT, and diabetes type 2 who presents for follow up.  She was first seen 04/2015 at which time she reported occasional chest pain.  She had an exercise Myoview 04/2015 that showed LVEF 78% with a small defect of moderate severity in the mid anterior and apical anterior region.  This was felt to be due to breast attenuation artifact.  However, she underwent cardiac catheterization on 05/26/15 that revealed a 50% LAD lesion. There was concern that there may be a component of vasospasm so long-acting nitrates were started. Her blood pressure and hyperlipidemia medications have been titrated due to poor control.  She is also on Lasix due to lower extremity edema.  [name]' husband died of COVID double pneumonia on 07/2019.  He was treated with Remdesivir at [facility] outpatient infusion center.  She was experiencing atypical chest pain.  She was referred for [facility] that revealed LVEF 74% with no ischemia.  Since then she called our office with concern for dizziness.  She spoke with our pharmacist and was advised to stop her hydralazine.  However her dizziness did not improve and it was not thought to be due to the medication or her blood pressure.  She had an episode while sitting on the toilet had to grab the garbage can to vomit.  They recommended that she see her PCP.  She had vertigo in the past and has used meclizine.  Her LFTs were elevated so her PCP stopped her pravastatin.  She since started Praluent.   Since her last appointment [name] was diagnosed  with DCIS in the R breast.  She underwent lumpectomy and XRT. She noted that since her surgery she was feeling exhausted.  She also became tired after taking the anazstrazole and is unable to drive after taking it. She had a sleep study 03/2021 which showed mild sleep apnea. Given underling medical conditions, it was recommended she start an oral airway device or a CPAP.  Today, she is doing well. She is about to reach the end of her prednisone. She was on 2 rounds of antibiotics for a lung infection. After the infection was resolved, fluid built up behind her ears. She was given a steroid shot and prednisone. Since starting the prednisone, she noticed changes to her eyesight and fluctuating blood sugar levels. She also noticed increased confusion on the prednisone. She has not been able to record her blood pressure at home because she misplaced her cuff. She has not been able to exercise recently. She continues to take fish oil supplements. While on the pravastatin, she had red spots along her stomach that did not itch or bother her. She discontinued pravastatin because it was no longer being refilled. She denies any palpitations, chest pain, or shortness of breath, lightheadedness, headaches, syncope, orthopnea, PND, lower extremity edema or exertional symptoms.  Past Medical History:  Diagnosis Date   Antral gastritis    EGD 11/15   Asthmatic bronchitis    Back pain    CAD in native artery 03/10/2021  Chronic diastolic heart failure (HCC) 05/20/2015   Grade 2 diastolic dysfunction.  04/2015.   Chronic kidney disease    kidney function low   Diabetes mellitus    x 5 yrs   DVT of axillary vein, acute left (HCC) 07/24/12   GERD (gastroesophageal reflux disease)    Glaucoma    POAG OU   Heart murmur    History of hiatal hernia    History of kidney stones    Hyperlipidemia 05/20/2015   Hypertension    Hypertensive retinopathy    OU   Hypothyroidism    Kidney stones    Macular degeneration     Wet OD, Dry OS   Mixed hyperlipidemia    Peripheral venous insufficiency    Pinched nerve    right elbow   Pneumonia    Sigmoid diverticulitis    Snoring 03/10/2021   Vertigo    chonic    Past Surgical History:  Procedure Laterality Date   ABDOMINAL HYSTERECTOMY     BACK SURGERY     spinal    BREAST LUMPECTOMY WITH RADIOACTIVE SEED LOCALIZATION Right 12/03/2020   Procedure: RIGHT BREAST LUMPECTOMY WITH RADIOACTIVE SEED LOCALIZATION;  Surgeon: Donnie Mesa, MD;  Location: [place];  Service: General;  Laterality: Right;   CARDIAC CATHETERIZATION N/A 05/26/2015   Procedure: Left Heart Cath and Coronary Angiography;  Surgeon: Jettie Booze, MD;  Location: [place] [facility];  Service: Cardiovascular;  Laterality: N/A;   CATARACT EXTRACTION Bilateral    CHOLECYSTECTOMY     COLON SURGERY     COLONOSCOPY N/A 12/27/2013   Procedure: COLONOSCOPY;  Surgeon: Rogene Houston, MD;  [facility];  Service: Endoscopy;  Laterality: N/A;  200   COLOSTOMY CLOSURE     ESOPHAGOGASTRODUODENOSCOPY N/A 05/07/2014   Procedure: ESOPHAGOGASTRODUODENOSCOPY (EGD);  Surgeon: Rogene Houston, MD;  [facility];  Service: Endoscopy;  Laterality: N/A;   EYE SURGERY Bilateral    Cat Sx   fracture left foot     HERNIA REPAIR     NM MYOCAR PERF WALL MOTION  01/28/2009   Normal   OTHER SURGICAL HISTORY     colostomy, colostomy reversal, for diverticulitis surgical hernia repair, arm surgery, neck surgery   US ECHOCARDIOGRAPHY  02/11/2010   Mild MR,trace TR & AI     Current Outpatient Medications  Medication Sig Dispense Refill   albuterol (PROVENTIL HFA;VENTOLIN HFA) 108 (90 BASE) MCG/ACT inhaler Inhale 2 puffs into the lungs every 4 (four) hours as needed for shortness of breath. 1 Inhaler 0   amitriptyline (ELAVIL) 25 MG tablet Take 25 mg by mouth at bedtime.     anastrozole (ARIMIDEX) 1 MG tablet Take 1 tablet (1 mg total) by mouth daily. 90 tablet 3   Black Cohosh 40 MG CAPS  Take 40 mg by mouth 2 (two) times daily.     carvedilol (COREG) 6.25 MG tablet Take 1 tablet (6.25 mg total) by mouth 2 (two) times daily. 180 tablet 1   cetirizine (ZYRTEC) 10 MG tablet Take 10 mg by mouth daily.     Cholecalciferol (VITAMIN D-3) 1000 units CAPS Take 1,000 Units by mouth daily.     diazepam (VALIUM) 2 MG tablet Take 2 mg by mouth 2 (two) times daily as needed for anxiety (dizziness).     dorzolamide-timolol (COSOPT) 22.3-6.8 MG/ML ophthalmic solution INSTILL 1 DROP INTO RIGHT EYE TWICE A DAY (Patient taking differently: Place 1 drop into the right eye 2 (two) times daily.)  30 mL 2   esomeprazole (NEXIUM) 20 MG capsule Take 20 mg by mouth daily at 12 noon.     FARXIGA 5 MG TABS tablet Take 5 mg by mouth every morning.     furosemide (LASIX) 40 MG tablet TAKE 1 TABLET (40 MG TOTAL) BY MOUTH DAILY. MAY TAKE EXTRA DAILY AS NEEDED FOR SWELLING 180 tablet 0   gabapentin (NEURONTIN) 300 MG capsule Take 300 mg by mouth 3 (three) times daily.     hydrALAZINE (APRESOLINE) 25 MG tablet Take 1 tablet (25 mg total) by mouth in the morning and at bedtime. 180 tablet 1   isosorbide mononitrate (IMDUR) 30 MG 24 hr tablet Take 1 tablet (30 mg total) by mouth daily. 90 tablet 1   latanoprost (XALATAN) 0.005 % ophthalmic solution Place 1 drop into both eyes at bedtime.      meclizine (ANTIVERT) 25 MG tablet Take 25 mg by mouth 2 (two) times daily as needed for dizziness.     Multiple Vitamins-Minerals (PRESERVISION AREDS 2 PO) Take 1 capsule by mouth in the morning and at bedtime.     Omega-3 Fatty Acids (FISH OIL) 1200 MG CAPS Take 1,200 mg by mouth 2 (two) times daily.     polyethylene glycol (MIRALAX / GLYCOLAX) packet Take 17 g by mouth daily.     potassium chloride SA (KLOR-CON) 20 MEQ tablet Take 0.5 tablets (10 mEq total) by mouth daily. 45 tablet 1   tetrahydrozoline 0.05 % ophthalmic solution Place 1 drop into both eyes at bedtime.     TRADJENTA 5 MG TABS tablet Take 5 mg by mouth daily.      vitamin B-12 (CYANOCOBALAMIN) 100 MCG tablet Take 100 mcg by mouth daily.     warfarin (COUMADIN) 2 MG tablet TAKE ONE TABLET BY MOUTH EVERYDAY AT BEDTIME 45 tablet 0   glimepiride (AMARYL) 2 MG tablet Take 2 mg by mouth daily. (Patient not taking: Reported on 06/10/2021)  2   No current facility-administered medications for this visit.    Allergies:   Alphagan [brimonidine], Diflunisal, Vioxx [rofecoxib], Metformin and related, Nexlizet [bempedoic acid-ezetimibe], Pravastatin, Repatha [evolocumab], Codeine, Elemental sulfur, Motrin [ibuprofen], and Penicillins    Social History:  The patient  reports that she quit smoking about 7 years ago. Her smoking use included cigarettes. She has never used smokeless tobacco. She reports that she does not drink alcohol and does not use drugs.   Family History:  The patient's family history includes Bone cancer in her maternal uncle; Breast cancer in her maternal grandmother and sister; CVA (age of onset: 6) in her maternal grandmother; Cancer in her mother; Glaucoma in her maternal uncle; Heart attack (age of onset: 18) in her brother; Heart disease in her maternal grandmother; Leukemia in her maternal aunt; Other (age of onset: 70) in her mother; Stroke in her maternal grandmother.    ROS:  Please see the history of present illness.    (+) Confusion All other systems are reviewed and negative.    PHYSICAL EXAM: VS:  BP 128/76   Pulse 72   Ht 5\' 2"  (1.575 m)   Wt 168 lb 6.4 oz (76.4 kg)   SpO2 97%   BMI 30.80 kg/m  , BMI Body mass index is 30.8 kg/m. GENERAL:  Well appearing HEENT: Pupils equal round and reactive, fundi not visualized, oral mucosa unremarkable NECK:  No jugular venous distention, waveform within normal limits, carotid upstroke brisk and symmetric, no bruit LUNGS:  Clear to auscultation bilaterally HEART:  RRR.  PMI not displaced or sustained,S1 and S2 within normal limits, no S3, no S4, no clicks, no rubs, no murmurs ABD:   Flat, positive bowel sounds normal in frequency in pitch, no bruits, no rebound, no guarding, no midline pulsatile mass, no hepatomegaly, no splenomegaly EXT:  2 plus pulses throughout, no edema, no cyanosis no clubbing SKIN:  No rashes no nodules NEURO:  Cranial nerves II through XII grossly intact, motor grossly intact throughout PSYCH:  Cognitively intact, oriented to person place and time  EKG: EKG was not ordered today 03/10/21: Sinus rhythm.  Rate 74 bpm.  RBBB.  LPFB.  04/11/20: Sinus rhythm.  Rate 75 bpm.  RBBB.  09/28/19: Sinus rhythm.  Rate 75 bpm.  RBBB. 09/15/17: Sinus rhythm.  Rate 64 bpm. RBBB  03/02/17: Sinus rhythm.  RBBB.  LPFB 02/16/16: Sinus rhythm rate 83 bpm.  PAC.  RBBB.  LPFB.  Lexiscan Myoview 10/2019: Nuclear stress EF: 74%. The left ventricular ejection fraction is hyperdynamic (>65%). There was no ST segment deviation noted during stress. This is a low risk study. There is no evidence of ischemia or previous infarction. The study is normal.  Exercise Myoview 04/30/15 The left ventricular ejection fraction is hyperdynamic (>65%). Nuclear stress EF: 78%. Blood pressure demonstrated a hypertensive response to exercise. Upsloping ST segment depression ST segment depression was noted during stress in the III, II and aVF leads. This is not diagnostic for ischemia. Defect 1: There is a small defect of moderate severity present in the mid anterior and apical anterior location. Wall motion is normal, so this is likely artifact due to breast attenuation. However, cannot rule out LAD ischemia. This is a low risk study.  Cardiac cath 05/26/15: Mid LAD lesion, 50% stenosed. FFR of this lesion showed value of 0.85. This is felt to be nonsignificant. Normal LVEDP. Continue aggressive medical therapy. Would consider adding long-acting nitrate as the patient may have some component of vasospasm. Of note, in the future , if repeat catheterization was needed , would consider using a 5  [place] guide catheter as her left main is very short and the catheter tends to deep seat  Into either the LAD or the circumflex.  Recent Labs: 11/25/2020: BUN 19; Creatinine, Ser 1.15; Hemoglobin 14.0; Platelets 247; Potassium 3.9; Sodium 141   03/12/15: INR 2.9   Lipid Panel    Component Value Date/Time   CHOL 225 (H) 10/05/2019 0859   TRIG 189 (H) 10/05/2019 0859   HDL 43 10/05/2019 0859   CHOLHDL 5.2 (H) 10/05/2019 0859   CHOLHDL 4.9 08/17/2016 0914   VLDL 33 (H) 08/17/2016 0914   LDLCALC 148 (H) 10/05/2019 0859      Wt Readings from Last 3 Encounters:  06/10/21 168 lb 6.4 oz (76.4 kg)  04/06/21 173 lb 9.6 oz (78.7 kg)  03/10/21 173 lb 14.4 oz (78.9 kg)      ASSESSMENT AND PLAN:  Chronic diastolic heart failure (HCC) She is euvolemic and doing well.  Continue Lasix, hydralazine, Imdur, and carvedilol.  Hyperlipidemia Lipids are poorly controlled.  She has nonobstructive coronary disease without any anginal symptoms.  We need to discuss lipids and options for management at follow-up.  She has been intolerant of statins in next visit.  Consider PCSK9 inhibitor or Inclisiran at follow-up.  HTN (hypertension) Blood pressures well-controlled on carvedilol, hydralazine, and Imdur.  DVT (deep venous thrombosis) She remains on warfarin chronically for prior DVT.  INR goal 2-3.  CAD in native artery 50% LAD stenosis.  Lexiscan Myoview was negative 10/2019.  She continues to do well without angina.  Discussed lipids at follow-up.    Current medicines are reviewed at length with the patient today.  The patient does not have concerns regarding medicines.  The following changes have been made:  no change  Labs/ tests ordered today include:   Orders Placed This Encounter  Procedures   Lipid panel   Comprehensive metabolic panel     Time spent: 43 minutes-Greater than 50% of this time was spent in counseling, explanation of diagnosis, planning of further management, and  coordination of care.  Disposition:   FU with Mariacristina Aday C. Oval Linsey, MD in 6 months  I,Mykaella Javier,acting as a Education administrator for Skeet Latch, MD.,have documented all relevant documentation on the behalf of Skeet Latch, MD,as directed by  Skeet Latch, MD while in the presence of Skeet Latch, MD.   I, Pickett Oval Linsey, MD have reviewed all documentation for this visit.  The documentation of the exam, diagnosis, procedures, and orders on 06/10/2021 are all accurate and complete.   Signed, Skeet Latch, MD  06/10/2021 5:15 PM    [facility]

## 2021-06-10 ENCOUNTER — Ambulatory Visit (HOSPITAL_BASED_OUTPATIENT_CLINIC_OR_DEPARTMENT_OTHER): Payer: Medicare Other | Admitting: Cardiovascular Disease

## 2021-06-10 ENCOUNTER — Encounter (HOSPITAL_BASED_OUTPATIENT_CLINIC_OR_DEPARTMENT_OTHER): Payer: Self-pay | Admitting: Cardiovascular Disease

## 2021-06-10 ENCOUNTER — Other Ambulatory Visit: Payer: Self-pay

## 2021-06-10 VITALS — BP 128/76 | HR 72 | Ht 62.0 in | Wt 168.4 lb

## 2021-06-10 DIAGNOSIS — I10 Essential (primary) hypertension: Secondary | ICD-10-CM

## 2021-06-10 DIAGNOSIS — I2510 Atherosclerotic heart disease of native coronary artery without angina pectoris: Secondary | ICD-10-CM | POA: Diagnosis not present

## 2021-06-10 DIAGNOSIS — E7800 Pure hypercholesterolemia, unspecified: Secondary | ICD-10-CM

## 2021-06-10 DIAGNOSIS — I825Z9 Chronic embolism and thrombosis of unspecified deep veins of unspecified distal lower extremity: Secondary | ICD-10-CM | POA: Diagnosis not present

## 2021-06-10 DIAGNOSIS — Z5181 Encounter for therapeutic drug level monitoring: Secondary | ICD-10-CM | POA: Diagnosis not present

## 2021-06-10 DIAGNOSIS — I5032 Chronic diastolic (congestive) heart failure: Secondary | ICD-10-CM | POA: Diagnosis not present

## 2021-06-10 NOTE — Assessment & Plan Note (Signed)
50% LAD stenosis.  Lexiscan Myoview was negative 10/2019.  She continues to do well without angina.  Discussed lipids at follow-up.

## 2021-06-10 NOTE — Assessment & Plan Note (Signed)
Lipids are poorly controlled.  She has nonobstructive coronary disease without any anginal symptoms.  We need to discuss lipids and options for management at follow-up.  She has been intolerant of statins in next visit.  Consider PCSK9 inhibitor or Inclisiran at follow-up.

## 2021-06-10 NOTE — Assessment & Plan Note (Signed)
She is euvolemic and doing well.  Continue Lasix, hydralazine, Imdur, and carvedilol.

## 2021-06-10 NOTE — Assessment & Plan Note (Signed)
Blood pressures well-controlled on carvedilol, hydralazine, and Imdur.

## 2021-06-10 NOTE — Patient Instructions (Signed)
Medication Instructions:  RESTART PRALUENT, WILL REACH OUT TO LIPID CLINIC TO HAVE THEM ARRANGE   *If you need a refill on your cardiac medications before your next appointment, please call your pharmacy*  Lab Work: FASTING LP/CMET 3-4 MONTHS AFTER RESUMING  [place]   If you have labs (blood work) drawn today and your tests are completely normal, you will receive your results only by: [place] (if you have MyChart) OR A paper copy in the mail If you have any lab test that is abnormal or we need to change your treatment, we will call you to review the results.  Testing/Procedures: NONE   Follow-Up: At [facility], you and your health needs are our priority.  As part of our continuing mission to provide you with exceptional heart care, we have created designated Provider Care Teams.  These Care Teams include your primary Cardiologist (physician) and Advanced Practice Providers (APPs -  Physician Assistants and Nurse Practitioners) who all work together to provide you with the care you need, when you need it.  We recommend signing up for the patient portal called "MyChart".  Sign up information is provided on this After Visit Summary.  MyChart is used to connect with patients for Virtual Visits (Telemedicine).  Patients are able to view lab/test results, encounter notes, upcoming appointments, etc.  Non-urgent messages can be sent to your provider as well.   To learn more about what you can do with MyChart, go to [email or web address].    Your next appointment:   6 month(s)  The format for your next appointment:   In Person  Provider:   Skeet Latch, MD

## 2021-06-10 NOTE — Assessment & Plan Note (Signed)
She remains on warfarin chronically for prior DVT.  INR goal 2-3.

## 2021-06-11 ENCOUNTER — Other Ambulatory Visit: Payer: Self-pay

## 2021-06-11 MED ORDER — WARFARIN SODIUM 2 MG PO TABS: ORAL_TABLET | ORAL | 1 refills | Status: DC

## 2021-06-13 DIAGNOSIS — I10 Essential (primary) hypertension: Secondary | ICD-10-CM | POA: Diagnosis not present

## 2021-06-13 DIAGNOSIS — G4733 Obstructive sleep apnea (adult) (pediatric): Secondary | ICD-10-CM | POA: Diagnosis not present

## 2021-06-15 ENCOUNTER — Ambulatory Visit (INDEPENDENT_AMBULATORY_CARE_PROVIDER_SITE_OTHER): Payer: Medicare Other | Admitting: *Deleted

## 2021-06-15 DIAGNOSIS — Z5181 Encounter for therapeutic drug level monitoring: Secondary | ICD-10-CM | POA: Diagnosis not present

## 2021-06-15 DIAGNOSIS — I82A12 Acute embolism and thrombosis of left axillary vein: Secondary | ICD-10-CM | POA: Diagnosis not present

## 2021-06-15 DIAGNOSIS — Z23 Encounter for immunization: Secondary | ICD-10-CM | POA: Diagnosis not present

## 2021-06-15 LAB — POCT INR: INR: 1.5 — AB (ref 2.0–3.0)

## 2021-06-15 NOTE — Patient Instructions (Signed)
Take warfarin 2 tablets tonight, 1 1/2 tablets tomorrow night then resume 1 tablet daily.   Recheck INR in 2 weeks. Call Coumadin clinic for any questions or changes in medications.

## 2021-06-16 ENCOUNTER — Telehealth: Payer: Self-pay

## 2021-06-16 DIAGNOSIS — E7849 Other hyperlipidemia: Secondary | ICD-10-CM

## 2021-06-16 NOTE — Telephone Encounter (Signed)
-----   Message from Earvin Hansen, LPN sent at [id number] 12:57 PM EST ----- [place],   Hope you are feeling better. This patient needs to restart her Praluent Can you reach out to her   Thanks  [place]

## 2021-06-16 NOTE — Telephone Encounter (Signed)
Called and spoke w/pt and stated that they are approved for praluent, rx sent  [facility], instructed pt to complete fasting labs post 4th dose, and to call [employer] directly because I tried to apply for a grant for her but it wouldn't allow me. [phone] [employer] direct number.

## 2021-06-19 ENCOUNTER — Telehealth: Payer: Self-pay | Admitting: Cardiovascular Disease

## 2021-06-19 ENCOUNTER — Telehealth (HOSPITAL_BASED_OUTPATIENT_CLINIC_OR_DEPARTMENT_OTHER): Payer: Self-pay | Admitting: Cardiovascular Disease

## 2021-06-19 ENCOUNTER — Telehealth: Payer: Medicare Other | Admitting: Cardiology

## 2021-06-19 NOTE — Telephone Encounter (Signed)
Pt c/o medication issue:  1. Name of Medication: Praluent  2. How are you currently taking this medication (dosage and times per day)? Has not started  3. Are you having a reaction (difficulty breathing--STAT)? no  4. What is your medication issue? Patient states she contacted the grant organization for the praluent and they gave her all the numbers for the office.

## 2021-06-19 NOTE — Telephone Encounter (Signed)
Will forward to [place] working with [employer]

## 2021-06-19 NOTE — Telephone Encounter (Signed)
Please advise 

## 2021-06-19 NOTE — Telephone Encounter (Signed)
Patient called to say that she got the grant for the prulant. Please advise

## 2021-06-19 NOTE — Telephone Encounter (Signed)
Will forward to [place] working with [employer]

## 2021-06-22 MED ORDER — PRALUENT 75 MG/ML ~~LOC~~ SOAJ: 75.0000 mg | SUBCUTANEOUS | 11 refills | Status: DC

## 2021-06-22 NOTE — Telephone Encounter (Signed)
Duplicate to another phone call

## 2021-06-22 NOTE — Telephone Encounter (Signed)
Called and spoke w/pt and stated that they will need to send the [employer] information to the pharmacy. The pt voiced understanding and rx was sent to [facility] as requested

## 2021-07-01 ENCOUNTER — Ambulatory Visit (INDEPENDENT_AMBULATORY_CARE_PROVIDER_SITE_OTHER): Payer: Medicare Other | Admitting: *Deleted

## 2021-07-01 ENCOUNTER — Other Ambulatory Visit: Payer: Self-pay

## 2021-07-01 DIAGNOSIS — I82A12 Acute embolism and thrombosis of left axillary vein: Secondary | ICD-10-CM | POA: Diagnosis not present

## 2021-07-01 DIAGNOSIS — Z5181 Encounter for therapeutic drug level monitoring: Secondary | ICD-10-CM

## 2021-07-01 LAB — POCT INR: INR: 2.7 (ref 2.0–3.0)

## 2021-07-01 NOTE — Patient Instructions (Signed)
Continue warfarin 1 tablet daily.   Recheck INR in 4 weeks. Call Coumadin clinic for any questions or changes in medications.

## 2021-07-03 DIAGNOSIS — I10 Essential (primary) hypertension: Secondary | ICD-10-CM | POA: Diagnosis not present

## 2021-07-03 DIAGNOSIS — I2510 Atherosclerotic heart disease of native coronary artery without angina pectoris: Secondary | ICD-10-CM | POA: Diagnosis not present

## 2021-07-03 DIAGNOSIS — E1165 Type 2 diabetes mellitus with hyperglycemia: Secondary | ICD-10-CM | POA: Diagnosis not present

## 2021-07-06 NOTE — Progress Notes (Addendum)
[facility] Note  07/07/2021     CHIEF COMPLAINT Patient presents for Retina Follow Up  HISTORY OF PRESENT ILLNESS: [name] is a 81 y.o. female who presents to the clinic today for:  HPI     Retina Follow Up   Patient presents with  Wet AMD.  In right eye.  This started 9 weeks ago.  I, the attending physician,  performed the HPI with the patient and updated documentation appropriately.        Comments   Patient here for 9 weeks retina follow up for exu ARMD OD. Patient states vision been blurry since started using night time gel. It is like looking through vaseline. Then eyes run in the am. Has been on prednisone for fluid behind ears. Was decreased off of prednisone.       Last edited by Bernarda Caffey, MD on 07/07/2021  4:53 PM.    Patient was on two rounds of antibiotics for her chest.  Started Prednisone for fluid in her ears.  BS has been elevated because of the Prednisone.  Last dose was last week.    Referring physician: Madelin Headings, DO [address] [place],  [place] [address]  HISTORICAL INFORMATION:  Selected notes from the MEDICAL RECORD NUMBER Referred by Dr. Madelin Headings for concern of SRF OD LEE: 11.27.20 (M. Cotter) [BCVA: OD: 20/80-- OS: 20/60-]  Ocular Hx-glaucoma (latanoprost)  PMH-DM    CURRENT MEDICATIONS: Current Outpatient Medications (Ophthalmic Drugs)  Medication Sig   dorzolamide-timolol (COSOPT) 22.3-6.8 MG/ML ophthalmic solution INSTILL 1 DROP INTO RIGHT EYE TWICE A DAY (Patient taking differently: Place 1 drop into the right eye 2 (two) times daily.)   latanoprost (XALATAN) 0.005 % ophthalmic solution Place 1 drop into both eyes at bedtime.    tetrahydrozoline 0.05 % ophthalmic solution Place 1 drop into both eyes at bedtime.   No current facility-administered medications for this visit. (Ophthalmic Drugs)   Current Outpatient Medications (Other)  Medication Sig   albuterol (PROVENTIL HFA;VENTOLIN HFA) 108  (90 BASE) MCG/ACT inhaler Inhale 2 puffs into the lungs every 4 (four) hours as needed for shortness of breath.   Alirocumab (PRALUENT) 75 MG/ML SOAJ Inject 75 mg into the skin every 14 (fourteen) days.   amitriptyline (ELAVIL) 25 MG tablet Take 25 mg by mouth at bedtime.   anastrozole (ARIMIDEX) 1 MG tablet Take 1 tablet (1 mg total) by mouth daily.   Black Cohosh 40 MG CAPS Take 40 mg by mouth 2 (two) times daily.   carvedilol (COREG) 6.25 MG tablet Take 1 tablet (6.25 mg total) by mouth 2 (two) times daily.   cetirizine (ZYRTEC) 10 MG tablet Take 10 mg by mouth daily.   Cholecalciferol (VITAMIN D-3) 1000 units CAPS Take 1,000 Units by mouth daily.   diazepam (VALIUM) 2 MG tablet Take 2 mg by mouth 2 (two) times daily as needed for anxiety (dizziness).   esomeprazole (NEXIUM) 20 MG capsule Take 20 mg by mouth daily at 12 noon.   FARXIGA 5 MG TABS tablet Take 5 mg by mouth every morning.   furosemide (LASIX) 40 MG tablet TAKE 1 TABLET (40 MG TOTAL) BY MOUTH DAILY. MAY TAKE EXTRA DAILY AS NEEDED FOR SWELLING   gabapentin (NEURONTIN) 300 MG capsule Take 300 mg by mouth 3 (three) times daily.   hydrALAZINE (APRESOLINE) 25 MG tablet Take 1 tablet (25 mg total) by mouth in the morning and at bedtime.   isosorbide mononitrate (IMDUR) 30 MG  24 hr tablet Take 1 tablet (30 mg total) by mouth daily.   meclizine (ANTIVERT) 25 MG tablet Take 25 mg by mouth 2 (two) times daily as needed for dizziness.   Multiple Vitamins-Minerals (PRESERVISION AREDS 2 PO) Take 1 capsule by mouth in the morning and at bedtime.   Omega-3 Fatty Acids (FISH OIL) 1200 MG CAPS Take 1,200 mg by mouth 2 (two) times daily.   polyethylene glycol (MIRALAX / GLYCOLAX) packet Take 17 g by mouth daily.   potassium chloride SA (KLOR-CON) 20 MEQ tablet Take 0.5 tablets (10 mEq total) by mouth daily.   TRADJENTA 5 MG TABS tablet Take 5 mg by mouth daily.   vitamin B-12 (CYANOCOBALAMIN) 100 MCG tablet Take 100 mcg by mouth daily.   warfarin  (COUMADIN) 2 MG tablet TAKE ONE TABLET BY MOUTH EVERYDAY AT BEDTIME   glimepiride (AMARYL) 2 MG tablet Take 2 mg by mouth daily. (Patient not taking: Reported on 06/10/2021)   No current facility-administered medications for this visit. (Other)   REVIEW OF SYSTEMS: ROS   Positive for: Genitourinary, Endocrine, Cardiovascular, Eyes Negative for: Constitutional, Gastrointestinal, Neurological, Skin, Musculoskeletal, HENT, Respiratory, Psychiatric, Allergic/Imm, Heme/Lymph Last edited by Theodore Demark, COA on 07/07/2021  1:46 PM.      ALLERGIES Allergies  Allergen Reactions   Alphagan [Brimonidine] Itching   Diflunisal Swelling    Other reaction(s): ENTIRE BODY SWELLING   Vioxx [Rofecoxib] Shortness Of Breath   Metformin And Related     Kidney failure   Nexlizet [Bempedoic Acid-Ezetimibe]     Causes elevated Liver and Kidney function   Pravastatin    Repatha [Evolocumab]     MYALGIAS   Codeine Rash   Elemental Sulfur Rash   Motrin [Ibuprofen] Rash   Penicillins Rash    PAST MEDICAL HISTORY Past Medical History:  Diagnosis Date   Antral gastritis    EGD 11/15   Asthmatic bronchitis    Back pain    CAD in native artery 03/10/2021   Chronic diastolic heart failure ([place]) 05/20/2015   Grade 2 diastolic dysfunction.  04/2015.   Chronic kidney disease    kidney function low   Diabetes mellitus    x 5 yrs   DVT of axillary vein, acute left (HCC) 07/24/12   GERD (gastroesophageal reflux disease)    Glaucoma    POAG OU   Heart murmur    History of hiatal hernia    History of kidney stones    Hyperlipidemia 05/20/2015   Hypertension    Hypertensive retinopathy    OU   Hypothyroidism    Kidney stones    Macular degeneration    Wet OD, Dry OS   Mixed hyperlipidemia    Peripheral venous insufficiency    Pinched nerve    right elbow   Pneumonia    Sigmoid diverticulitis    Snoring 03/10/2021   Vertigo    chonic   Past Surgical History:  Procedure Laterality Date    ABDOMINAL HYSTERECTOMY     BACK SURGERY     spinal    BREAST LUMPECTOMY WITH RADIOACTIVE SEED LOCALIZATION Right 12/03/2020   Procedure: RIGHT BREAST LUMPECTOMY WITH RADIOACTIVE SEED LOCALIZATION;  Surgeon: Donnie Mesa, MD;  Location: [facility];  Service: General;  Laterality: Right;   CARDIAC CATHETERIZATION N/A 05/26/2015   Procedure: Left Heart Cath and Coronary Angiography;  Surgeon: Jettie Booze, MD;  Location: [facility];  Service: Cardiovascular;  Laterality: N/A;   CATARACT EXTRACTION Bilateral  CHOLECYSTECTOMY     COLON SURGERY     COLONOSCOPY N/A 12/27/2013   Procedure: COLONOSCOPY;  Surgeon: Rogene Houston, MD;  [facility];  Service: Endoscopy;  Laterality: N/A;  200   COLOSTOMY CLOSURE     ESOPHAGOGASTRODUODENOSCOPY N/A 05/07/2014   Procedure: ESOPHAGOGASTRODUODENOSCOPY (EGD);  Surgeon: Rogene Houston, MD;  [facility];  Service: Endoscopy;  Laterality: N/A;   EYE SURGERY Bilateral    Cat Sx   fracture left foot     HERNIA REPAIR     NM MYOCAR PERF WALL MOTION  01/28/2009   Normal   OTHER SURGICAL HISTORY     colostomy, colostomy reversal, for diverticulitis surgical hernia repair, arm surgery, neck surgery   US ECHOCARDIOGRAPHY  02/11/2010   Mild MR,trace TR & AI   FAMILY HISTORY Family History  Problem Relation Age of Onset   Other Mother 54       Cause unknown   Cancer Mother        liver   Heart attack Brother 40       deceased   CVA Maternal Grandmother 74       deceased   Heart disease Maternal Grandmother    Stroke Maternal Grandmother    Breast cancer Maternal Grandmother    Glaucoma Maternal Uncle    Bone cancer Maternal Uncle    Leukemia Maternal Aunt    Breast cancer Sister    SOCIAL HISTORY Social History   Tobacco Use   Smoking status: Former    Types: Cigarettes    Quit date: 12/10/2013    Years since quitting: 7.5   Smokeless tobacco: Never   Tobacco comments:    Smoke 1-1 1/2 packs a day  Vaping  Use   Vaping Use: Never used  Substance Use Topics   Alcohol use: No   Drug use: No       OPHTHALMIC EXAM: Base Eye Exam     Visual Acuity (Snellen - Linear)       Right Left   Dist [place] 20/40 -2 20/25 -1   Dist ph [place] 20/30 +2 20/20 -2         Tonometry (Tonopen, 1:41 PM)       Right Left   Pressure 24 17  I got 24 as the readings several times.        Pupils       Dark Light Shape React APD   Right 2 1 Round Brisk None   Left 2 1 Round Brisk None         Visual Fields (Counting fingers)       Left Right    Full Full         Extraocular Movement       Right Left    Full, Ortho Full, Ortho         Neuro/Psych     Oriented x3: Yes   Mood/Affect: Normal         Dilation     Both eyes: 1.0% Mydriacyl, 2.5% Phenylephrine @ 1:41 PM           Slit Lamp and Fundus Exam     Slit Lamp Exam       Right Left   Lids/Lashes Dermatochalasis - upper lid, mild Meibomian gland dysfunction Dermatochalasis - upper lid, Telangiectasia, mild Meibomian gland dysfunction   Conjunctiva/Sclera White and quiet White and quiet   Cornea 3+Punctate epithelial erosions, mild arcus, central haze, well healed cataract  wound 1-2+ fine Punctate epithelial erosions, central haze   Anterior Chamber Deep and quiet, narrow temporal angle Deep and quiet, narrow temporal angle   Iris Round and dilated Round and moderately dilated to 5.69mm   Lens Posterior chamber intraocular lens, open PC Posterior chamber intraocular lens   Anterior Vitreous Vitreous syneresis, Posterior vitreous detachment Vitreous syneresis         Fundus Exam       Right Left   Disc Sharp rim, Pallor, +cupping, mild, temporal Peripapillary atrophy, superior rim thinning Pink and Sharp, temporal Peripapillary atrophy   C/D Ratio 0.75 0.5   Macula Flat, Blunted foveal reflex, +focal CNV/PED nasal macula with partial pigment ring, stable improvement in IRF/ SRF, Drusen, RPE mottling and clumping, no  heme Flat, Blunted foveal reflex, drusen, trace ERM, Retinal pigment epithelial mottling and clumping, No heme or edema   Vessels Vascular attenuation, Tortuous Vascular attenuation, Tortuous   Periphery Attached, mild reticular degeneration, No heme, No RT/RD Attached; no heme           IMAGING AND PROCEDURES  Imaging and Procedures for @TODAY @  OCT, Retina - OU - Both Eyes       Right Eye Quality was good. Central Foveal Thickness: 191. Progression has improved. Findings include intraretinal hyper-reflective material, pigment epithelial detachment, subretinal hyper-reflective material, retinal drusen , normal foveal contour, outer retinal atrophy, no IRF, subretinal fluid (Stable improvement in IRF/SRF overlying nasal PED; partial PVD).   Left Eye Quality was good. Central Foveal Thickness: 214. Progression has been stable. Findings include normal foveal contour, no IRF, no SRF, retinal drusen (Partial PVD).   Notes *Images captured and stored on drive  Diagnosis / Impression:  OD: exudative ARMD; Stable improvement in IRF/SRF overlying nasal PED; partial PVD OS: NFP, no IRF/SRF; +drusen -- nonexudative ARMD  Clinical management:  See below  Abbreviations: NFP - Normal foveal profile. CME - cystoid macular edema. PED - pigment epithelial detachment. IRF - intraretinal fluid. SRF - subretinal fluid. EZ - ellipsoid zone. ERM - epiretinal membrane. ORA - outer retinal atrophy. ORT - outer retinal tubulation. SRHM - subretinal hyper-reflective material      Intravitreal Injection, Pharmacologic Agent - OD - Right Eye       Time Out 07/07/2021. 2:32 PM. Confirmed correct patient, procedure, site, and patient consented.   Anesthesia Topical anesthesia was used. Anesthetic medications included Lidocaine 2%, Proparacaine 0.5%.   Procedure Preparation included 5% betadine to ocular surface, eyelid speculum. A supplied needle was used.   Injection: 1.25 mg Bevacizumab  1.25mg /0.37ml   Route: Intravitreal, Site: Right Eye   NDC: H061816, Lot: [id number] , Expiration date: 08/11/2021, Waste: 0 mL   Post-op Post injection exam found visual acuity of at least counting fingers. The patient tolerated the procedure well. There were no complications. The patient received written and verbal post procedure care education. Post injection medications were not given.              ASSESSMENT/PLAN:   ICD-10-CM   1. Exudative age-related macular degeneration of right eye with active choroidal neovascularization (HCC)  H35.3211 OCT, Retina - OU - Both Eyes    Intravitreal Injection, Pharmacologic Agent - OD - Right Eye    Bevacizumab (AVASTIN) SOLN 1.25 mg    2. Intermediate stage nonexudative age-related macular degeneration of left eye  H35.3122     3. Diabetes mellitus type 2 without retinopathy ([place])  E11.9     4. Essential hypertension  I10  5. Hypertensive retinopathy of both eyes  H35.033     6. Pseudophakia of both eyes  Z96.1     7. PCO (posterior capsular opacification), right  H26.491     8. Primary open angle glaucoma of both eyes, unspecified glaucoma stage  H40.1130      1. 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/ DCIS  - h/o delayed follow up from 4 weeks to 8 weeks due to passing of husband (12.11.20-02.12.21)  - s/p IVA OD #1 (12.11.20), #2 (02.12.21), #3 (03.12.21), #4 (04.09.21), #5 (05.14.21), #6 (06.17.21), #7 (07.23.21), #8 (10.15.21), #9 (12.7.21), #10 (03.22.22), #11 (06.20.22), #12 (08.24.22), #13 (10.31.22)  - FA 12.11.20 confirms +CNVM  - OCT today shows Stable improvement in IRF/SRF overlying nasal PED; partial PVD at 9 wks  - BCVA OD 20/30 from 20/25   - recommend IVA OD #14 today, 01.03.23 -- will extend to 10 weeks  - pt wishes to proceed  - RBA of procedure discussed, questions answered - informed consent obtained and signed - see  procedure note - Avastin informed consent form signed and scanned on 12.11.2020 (OD)             - Continue to use AT OD  - f/u 10 weeks -- DFE/OCT/possible injection; treat and extend as able  2. Age related macular degeneration, non-exudative, OS  - intermediate stage  - The incidence, anatomy, and pathology of dry AMD, risk of progression, and the AREDS and AREDS 2 study including smoking risks discussed with patient.  - recommend [place]ler grid monitoring  3. Diabetes mellitus, type 2 without retinopathy  - The incidence, risk factors for progression, natural history and treatment options for diabetic retinopathy  were discussed with patient.    - The need for close monitoring of blood glucose, blood pressure, and serum lipids, avoiding cigarette or any type of tobacco, and the need for long term follow up was also discussed with patient.  - monitor  4,5. Hypertensive retinopathy OU  - discussed importance of tight BP control  - monitor  6,7. Pseudophakia OU, PCO OD  - s/p CE/IOL OU (Dr. Venetia Maxon)  - IOL in good position  - s/p yag cap OD (04.06.22) -- good PC opening  - monitor  8. POAG OU  - formerly managed by Dr. Venetia Maxon  - s/p laser w/ Dr. Venetia Maxon -- ?SLT  - IOP 24, 17 (could have been from the Prednisone)  - currently on latanoprost QHS OU  - cont Cosopt BID OD only  - monitor  Ophthalmic Meds Ordered this visit:  Meds ordered this encounter  Medications   Bevacizumab (AVASTIN) SOLN 1.25 mg     Return in about 10 weeks (around 09/15/2021) for DFE, OCT, possible injection.  There are no Patient Instructions on file for this visit.  This document serves as a record of services personally performed by Gardiner Sleeper, MD, PhD. It was created on their behalf by Estill Bakes, COT an ophthalmic technician. The creation of this record is the provider's dictation and/or activities during the visit.    Electronically signed by: Estill Bakes, COT 1.2.23 @ 4:57 PM   This  document serves as a record of services personally performed by Gardiner Sleeper, MD, PhD. It was created on their behalf by Leonie Douglas, an ophthalmic technician. The creation of this record is the provider's dictation and/or activities during the visit.  Electronically signed by: Leonie Douglas COA, 07/07/21  4:57 PM   Gardiner Sleeper, M.D., Ph.D. Diseases & Surgery of the Retina and Vitreous [facility]  I have reviewed the above documentation for accuracy and completeness, and I agree with the above. Gardiner Sleeper, M.D., Ph.D. 07/07/21 4:57 PM   Abbreviations: M myopia (nearsighted); A astigmatism; H hyperopia (farsighted); P presbyopia; Mrx spectacle prescription;  CTL contact lenses; OD right eye; OS left eye; OU both eyes  XT exotropia; ET esotropia; PEK punctate epithelial keratitis; PEE punctate epithelial erosions; DES dry eye syndrome; MGD meibomian gland dysfunction; ATs artificial tears; PFAT's preservative free artificial tears; [place] nuclear sclerotic cataract; PSC posterior subcapsular cataract; ERM epi-retinal membrane; PVD posterior vitreous detachment; RD retinal detachment; DM diabetes mellitus; DR diabetic retinopathy; NPDR non-proliferative diabetic retinopathy; PDR proliferative diabetic retinopathy; CSME clinically significant macular edema; DME diabetic macular edema; dbh dot blot hemorrhages; CWS cotton wool spot; POAG primary open angle glaucoma; C/D cup-to-disc ratio; HVF humphrey visual field; GVF goldmann visual field; OCT optical coherence tomography; IOP intraocular pressure; BRVO Branch retinal vein occlusion; CRVO central retinal vein occlusion; CRAO central retinal artery occlusion; BRAO branch retinal artery occlusion; RT retinal tear; SB scleral buckle; PPV pars plana vitrectomy; VH Vitreous hemorrhage; PRP panretinal laser photocoagulation; IVK intravitreal kenalog; VMT vitreomacular traction; MH Macular hole;  NVD neovascularization of the  disc; NVE neovascularization elsewhere; AREDS age related eye disease study; ARMD age related macular degeneration; POAG primary open angle glaucoma; EBMD epithelial/anterior basement membrane dystrophy; ACIOL anterior chamber intraocular lens; IOL intraocular lens; PCIOL posterior chamber intraocular lens; Phaco/IOL phacoemulsification with intraocular lens placement; [place] photorefractive keratectomy; LASIK laser assisted in situ keratomileusis; HTN hypertension; DM diabetes mellitus; COPD chronic obstructive pulmonary disease

## 2021-07-07 ENCOUNTER — Encounter (INDEPENDENT_AMBULATORY_CARE_PROVIDER_SITE_OTHER): Payer: Self-pay | Admitting: Ophthalmology

## 2021-07-07 ENCOUNTER — Other Ambulatory Visit: Payer: Self-pay

## 2021-07-07 ENCOUNTER — Ambulatory Visit (INDEPENDENT_AMBULATORY_CARE_PROVIDER_SITE_OTHER): Payer: HMO | Admitting: Ophthalmology

## 2021-07-07 DIAGNOSIS — I10 Essential (primary) hypertension: Secondary | ICD-10-CM

## 2021-07-07 DIAGNOSIS — H353122 Nonexudative age-related macular degeneration, left eye, intermediate dry stage: Secondary | ICD-10-CM

## 2021-07-07 DIAGNOSIS — Z961 Presence of intraocular lens: Secondary | ICD-10-CM

## 2021-07-07 DIAGNOSIS — H353211 Exudative age-related macular degeneration, right eye, with active choroidal neovascularization: Secondary | ICD-10-CM | POA: Diagnosis not present

## 2021-07-07 DIAGNOSIS — H26491 Other secondary cataract, right eye: Secondary | ICD-10-CM

## 2021-07-07 DIAGNOSIS — E119 Type 2 diabetes mellitus without complications: Secondary | ICD-10-CM

## 2021-07-07 DIAGNOSIS — H35033 Hypertensive retinopathy, bilateral: Secondary | ICD-10-CM | POA: Diagnosis not present

## 2021-07-07 DIAGNOSIS — H401130 Primary open-angle glaucoma, bilateral, stage unspecified: Secondary | ICD-10-CM

## 2021-07-07 MED ORDER — BEVACIZUMAB CHEMO INJECTION 1.25MG/0.05ML SYRINGE FOR KALEIDOSCOPE
1.2500 mg | INTRAVITREAL | Status: AC | PRN
  Administered 2021-07-07: 1.25 mg via INTRAVITREAL

## 2021-07-08 ENCOUNTER — Telehealth (INDEPENDENT_AMBULATORY_CARE_PROVIDER_SITE_OTHER): Payer: HMO | Admitting: Cardiology

## 2021-07-08 ENCOUNTER — Encounter: Payer: Self-pay | Admitting: Cardiology

## 2021-07-08 VITALS — BP 132/70 | HR 83 | Ht 62.0 in | Wt 173.2 lb

## 2021-07-08 DIAGNOSIS — G4733 Obstructive sleep apnea (adult) (pediatric): Secondary | ICD-10-CM | POA: Diagnosis not present

## 2021-07-08 DIAGNOSIS — I10 Essential (primary) hypertension: Secondary | ICD-10-CM | POA: Diagnosis not present

## 2021-07-08 NOTE — Progress Notes (Signed)
Virtual Visit via Video Note   This visit type was conducted due to national recommendations for restrictions regarding the COVID-19 Pandemic (e.g. social distancing) in an effort to limit this patient's exposure and mitigate transmission in our community.  Due to her co-morbid illnesses, this patient is at least at moderate risk for complications without adequate follow up.  This format is felt to be most appropriate for this patient at this time.  All issues noted in this document were discussed and addressed.  A limited physical exam was performed with this format.  Please refer to the patient's chart for her consent to telehealth for [facility].    Date:  07/08/2021   ID:  [name], DOB 1941/02/28, MRN [id number] The patient was identified using 2 identifiers.  Patient Location: Home Provider Location: Home Office   PCP:  Sharilyn Sites, MD   [facility] Cardiologist:  Skeet Latch, MD     Evaluation Performed:  Follow-Up Visit  Chief Complaint:  OSA  History of Present Illness: SHe   [name] is a 81 y.o. female with a hx of CAD, chronic diastolic CHF and DM who was referred for HST which showed mild OSA with AHI 13.8/hr and nocturnal hypoxemia with O2 sats < 88% for 23 min.  She tells me that she was having problems with excessive daytime sleepiness and would have to nap during the day.  She was also told that she snored when on her back sleeping.  She was started on auto CPAP therapy and is now here for followup.    She is doing well with her CPAP device and thinks that she has gotten used to it.  She tolerates the FF mask but says that her mask is leaking some and will blow air into her eyes and wake her up.  She feels the pressure is adequate.  Since going on CPAP she feels rested in the am and has no significant daytime sleepiness.  She denies any significant nasal dryness but at times will have some mouth dryness.   The patient does not have  symptoms concerning for COVID-19 infection (fever, chills, cough, or new shortness of breath).    Past Medical History:  Diagnosis Date   Antral gastritis    EGD 11/15   Asthmatic bronchitis    Back pain    CAD in native artery 03/10/2021   Chronic diastolic heart failure ([place]) 05/20/2015   Grade 2 diastolic dysfunction.  04/2015.   Chronic kidney disease    kidney function low   Diabetes mellitus    x 5 yrs   DVT of axillary vein, acute left (HCC) 07/24/12   GERD (gastroesophageal reflux disease)    Glaucoma    POAG OU   Heart murmur    History of hiatal hernia    History of kidney stones    Hyperlipidemia 05/20/2015   Hypertension    Hypertensive retinopathy    OU   Hypothyroidism    Kidney stones    Macular degeneration    Wet OD, Dry OS   Mixed hyperlipidemia    Peripheral venous insufficiency    Pinched nerve    right elbow   Pneumonia    Sigmoid diverticulitis    Snoring 03/10/2021   Vertigo    chonic   Past Surgical History:  Procedure Laterality Date   ABDOMINAL HYSTERECTOMY     BACK SURGERY     spinal    BREAST LUMPECTOMY WITH RADIOACTIVE SEED  LOCALIZATION Right 12/03/2020   Procedure: RIGHT BREAST LUMPECTOMY WITH RADIOACTIVE SEED LOCALIZATION;  Surgeon: Donnie Mesa, MD;  Location: [facility];  Service: General;  Laterality: Right;   CARDIAC CATHETERIZATION N/A 05/26/2015   Procedure: Left Heart Cath and Coronary Angiography;  Surgeon: Jettie Booze, MD;  Location: [facility];  Service: Cardiovascular;  Laterality: N/A;   CATARACT EXTRACTION Bilateral    CHOLECYSTECTOMY     COLON SURGERY     COLONOSCOPY N/A 12/27/2013   Procedure: COLONOSCOPY;  Surgeon: Rogene Houston, MD;  [facility];  Service: Endoscopy;  Laterality: N/A;  200   COLOSTOMY CLOSURE     ESOPHAGOGASTRODUODENOSCOPY N/A 05/07/2014   Procedure: ESOPHAGOGASTRODUODENOSCOPY (EGD);  Surgeon: Rogene Houston, MD;  [facility];  Service: Endoscopy;  Laterality: N/A;    EYE SURGERY Bilateral    Cat Sx   fracture left foot     HERNIA REPAIR     NM MYOCAR PERF WALL MOTION  01/28/2009   Normal   OTHER SURGICAL HISTORY     colostomy, colostomy reversal, for diverticulitis surgical hernia repair, arm surgery, neck surgery   US ECHOCARDIOGRAPHY  02/11/2010   Mild MR,trace TR & AI     Current Meds  Medication Sig   albuterol (PROVENTIL HFA;VENTOLIN HFA) 108 (90 BASE) MCG/ACT inhaler Inhale 2 puffs into the lungs every 4 (four) hours as needed for shortness of breath.   Alirocumab (PRALUENT) 75 MG/ML SOAJ Inject 75 mg into the skin every 14 (fourteen) days.   amitriptyline (ELAVIL) 25 MG tablet Take 25 mg by mouth at bedtime.   anastrozole (ARIMIDEX) 1 MG tablet Take 1 tablet (1 mg total) by mouth daily.   Black Cohosh 40 MG CAPS Take 40 mg by mouth 2 (two) times daily.   carvedilol (COREG) 6.25 MG tablet Take 1 tablet (6.25 mg total) by mouth 2 (two) times daily.   cetirizine (ZYRTEC) 10 MG tablet Take 10 mg by mouth daily.   Cholecalciferol (VITAMIN D-3) 1000 units CAPS Take 1,000 Units by mouth daily.   diazepam (VALIUM) 2 MG tablet Take 2 mg by mouth 2 (two) times daily as needed for anxiety (dizziness).   dorzolamide-timolol (COSOPT) 22.3-6.8 MG/ML ophthalmic solution INSTILL 1 DROP INTO RIGHT EYE TWICE A DAY (Patient taking differently: Place 1 drop into the right eye 2 (two) times daily.)   esomeprazole (NEXIUM) 20 MG capsule Take 20 mg by mouth daily at 12 noon.   FARXIGA 5 MG TABS tablet Take 5 mg by mouth every morning.   furosemide (LASIX) 40 MG tablet TAKE 1 TABLET (40 MG TOTAL) BY MOUTH DAILY. MAY TAKE EXTRA DAILY AS NEEDED FOR SWELLING   gabapentin (NEURONTIN) 300 MG capsule Take 300 mg by mouth 3 (three) times daily.   glimepiride (AMARYL) 2 MG tablet Take 2 mg by mouth daily.   hydrALAZINE (APRESOLINE) 25 MG tablet Take 1 tablet (25 mg total) by mouth in the morning and at bedtime.   isosorbide mononitrate (IMDUR) 30 MG 24 hr tablet Take 1  tablet (30 mg total) by mouth daily.   latanoprost (XALATAN) 0.005 % ophthalmic solution Place 1 drop into both eyes at bedtime.    meclizine (ANTIVERT) 25 MG tablet Take 25 mg by mouth 2 (two) times daily as needed for dizziness.   Multiple Vitamins-Minerals (PRESERVISION AREDS 2 PO) Take 1 capsule by mouth in the morning and at bedtime.   Omega-3 Fatty Acids (FISH OIL) 1200 MG CAPS Take 1,200 mg by  mouth 2 (two) times daily.   polyethylene glycol (MIRALAX / GLYCOLAX) packet Take 17 g by mouth daily.   potassium chloride SA (KLOR-CON) 20 MEQ tablet Take 0.5 tablets (10 mEq total) by mouth daily.   tetrahydrozoline 0.05 % ophthalmic solution Place 1 drop into both eyes at bedtime.   TRADJENTA 5 MG TABS tablet Take 5 mg by mouth daily.   vitamin B-12 (CYANOCOBALAMIN) 100 MCG tablet Take 100 mcg by mouth daily.   warfarin (COUMADIN) 2 MG tablet TAKE ONE TABLET BY MOUTH EVERYDAY AT BEDTIME     Allergies:   Alphagan [brimonidine], Diflunisal, Vioxx [rofecoxib], Metformin and related, Nexlizet [bempedoic acid-ezetimibe], Pravastatin, Repatha [evolocumab], Codeine, Elemental sulfur, Motrin [ibuprofen], and Penicillins   Social History   Tobacco Use   Smoking status: Former    Types: Cigarettes    Quit date: 12/10/2013    Years since quitting: 7.5   Smokeless tobacco: Never   Tobacco comments:    Smoke 1-1 1/2 packs a day  Vaping Use   Vaping Use: Never used  Substance Use Topics   Alcohol use: No   Drug use: No     Family Hx: The patient's family history includes Bone cancer in her maternal uncle; Breast cancer in her maternal grandmother and sister; CVA (age of onset: 71) in her maternal grandmother; Cancer in her mother; Glaucoma in her maternal uncle; Heart attack (age of onset: 60) in her brother; Heart disease in her maternal grandmother; Leukemia in her maternal aunt; Other (age of onset: 70) in her mother; Stroke in her maternal grandmother.  ROS:   Please see the history of  present illness.     All other systems reviewed and are negative.   Prior CV studies:   The following studies were reviewed today:  HST and PAP compliance download  Labs/Other Tests and Data Reviewed:    EKG:  No ECG reviewed.  Recent Labs: 11/25/2020: BUN 19; Creatinine, Ser 1.15; Hemoglobin 14.0; Platelets 247; Potassium 3.9; Sodium 141   Recent Lipid Panel Lab Results  Component Value Date/Time   CHOL 225 (H) 10/05/2019 08:59 AM   TRIG 189 (H) 10/05/2019 08:59 AM   HDL 43 10/05/2019 08:59 AM   CHOLHDL 5.2 (H) 10/05/2019 08:59 AM   CHOLHDL 4.9 08/17/2016 09:14 AM   LDLCALC 148 (H) 10/05/2019 08:59 AM    Wt Readings from Last 3 Encounters:  07/08/21 173 lb 3.2 oz (78.6 kg)  06/10/21 168 lb 6.4 oz (76.4 kg)  04/06/21 173 lb 9.6 oz (78.7 kg)     Risk Assessment/Calculations:          Objective:    Vital Signs:  BP 132/70    Pulse 83    Ht 5\' 2"  (1.575 m)    Wt 173 lb 3.2 oz (78.6 kg)    SpO2 98%    BMI 31.68 kg/m    VITAL SIGNS:  reviewed GEN:  no acute distress EYES:  sclerae anicteric, EOMI - Extraocular Movements Intact RESPIRATORY:  normal respiratory effort, symmetric expansion CARDIOVASCULAR:  no peripheral edema SKIN:  no rash, lesions or ulcers. MUSCULOSKELETAL:  no obvious deformities. NEURO:  alert and oriented x 3, no obvious focal deficit PSYCH:  normal affect  ASSESSMENT & PLAN:    OSA - The patient is tolerating PAP therapy well without any problems. The PAP download performed by his DME was personally reviewed and interpreted by me today and showed an AHI of 1.3/hr on Auto CPAP with 97% compliance in using more  than 4 hours nightly.  The patient has been using and benefiting from PAP use and will continue to benefit from therapy.  -She does have a significant mask leak at times on her download and so I will set up an appt with her DME to address this as well as instruct her on how to adjust her humidity for mouth dryness.   HTN -BP controlled on  exam -continue prescription drug management with Carvedilol 6.25mg  BID, Hydralazine 25mg  BID, Imdur 30mg  daily with PRN refills    COVID-19 Education: The signs and symptoms of COVID-19 were discussed with the patient and how to seek care for testing (follow up with PCP or arrange E-visit). The importance of social distancing was discussed today.  Time:   Today, I have spent 20 minutes with the patient with telehealth technology discussing the above problems.     Medication Adjustments/Labs and Tests Ordered: Current medicines are reviewed at length with the patient today.  Concerns regarding medicines are outlined above.   Tests Ordered: No orders of the defined types were placed in this encounter.   Medication Changes: No orders of the defined types were placed in this encounter.   Follow Up:  In Person in 1 year(s)  Signed, Fransico Him, MD  07/08/2021 11:01 AM    [place]

## 2021-07-08 NOTE — Patient Instructions (Signed)
Medication Instructions:  Your physician recommends that you continue on your current medications as directed. Please refer to the Current Medication list given to you today.  *If you need a refill on your cardiac medications before your next appointment, please call your pharmacy*   Lab Work: None ordered   If you have labs (blood work) drawn today and your tests are completely normal, you will receive your results only by: [place] (if you have MyChart) OR A paper copy in the mail If you have any lab test that is abnormal or we need to change your treatment, we will call you to review the results.   Testing/Procedures: None ordered    Follow-Up: At [facility], you and your health needs are our priority.  As part of our continuing mission to provide you with exceptional heart care, we have created designated Provider Care Teams.  These Care Teams include your primary Cardiologist (physician) and Advanced Practice Providers (APPs -  Physician Assistants and Nurse Practitioners) who all work together to provide you with the care you need, when you need it.  We recommend signing up for the patient portal called "MyChart".  Sign up information is provided on this After Visit Summary.  MyChart is used to connect with patients for Virtual Visits (Telemedicine).  Patients are able to view lab/test results, encounter notes, upcoming appointments, etc.  Non-urgent messages can be sent to your provider as well.   To learn more about what you can do with MyChart, go to [email or web address].    Your next appointment:   12 month(s)  The format for your next appointment:   In Person  Provider:   Fransico Him, MD   Other Instructions None

## 2021-07-09 ENCOUNTER — Telehealth: Payer: Self-pay | Admitting: *Deleted

## 2021-07-09 DIAGNOSIS — G4733 Obstructive sleep apnea (adult) (pediatric): Secondary | ICD-10-CM

## 2021-07-09 NOTE — Telephone Encounter (Signed)
Order placed to [facility] via fax 

## 2021-07-09 NOTE — Telephone Encounter (Signed)
Late entry from 07-08-21. set up an appt with her DME to address significant mask leak as well as instruct her on how to adjust her humidity for mouth dryness.  Followup with me in 1 year

## 2021-07-14 DIAGNOSIS — I10 Essential (primary) hypertension: Secondary | ICD-10-CM | POA: Diagnosis not present

## 2021-07-14 DIAGNOSIS — G4733 Obstructive sleep apnea (adult) (pediatric): Secondary | ICD-10-CM | POA: Diagnosis not present

## 2021-07-29 ENCOUNTER — Ambulatory Visit (INDEPENDENT_AMBULATORY_CARE_PROVIDER_SITE_OTHER): Payer: HMO | Admitting: *Deleted

## 2021-07-29 DIAGNOSIS — Z5181 Encounter for therapeutic drug level monitoring: Secondary | ICD-10-CM

## 2021-07-29 DIAGNOSIS — I82A12 Acute embolism and thrombosis of left axillary vein: Secondary | ICD-10-CM

## 2021-07-29 LAB — POCT INR: INR: 2.6 (ref 2.0–3.0)

## 2021-07-29 NOTE — Patient Instructions (Signed)
Continue warfarin 1 tablet daily.   Recheck INR in 4 weeks. Call Coumadin clinic for any questions or changes in medications.

## 2021-08-01 ENCOUNTER — Other Ambulatory Visit: Payer: Self-pay

## 2021-08-01 ENCOUNTER — Ambulatory Visit
Admission: EM | Admit: 2021-08-01 | Discharge: 2021-08-01 | Disposition: A | Discharge status: Home / Self Care | Payer: HMO | Attending: Family Medicine | Admitting: Family Medicine

## 2021-08-01 DIAGNOSIS — Z20828 Contact with and (suspected) exposure to other viral communicable diseases: Secondary | ICD-10-CM

## 2021-08-01 DIAGNOSIS — J4521 Mild intermittent asthma with (acute) exacerbation: Secondary | ICD-10-CM | POA: Diagnosis not present

## 2021-08-01 DIAGNOSIS — R051 Acute cough: Secondary | ICD-10-CM | POA: Diagnosis not present

## 2021-08-01 DIAGNOSIS — J0100 Acute maxillary sinusitis, unspecified: Secondary | ICD-10-CM | POA: Diagnosis not present

## 2021-08-01 DIAGNOSIS — R062 Wheezing: Secondary | ICD-10-CM

## 2021-08-01 MED ORDER — BENZONATATE 100 MG PO CAPS: ORAL_CAPSULE | ORAL | 0 refills | Status: DC

## 2021-08-01 MED ORDER — CEFDINIR 300 MG PO CAPS: 300.0000 mg | ORAL_CAPSULE | Freq: Two times a day (BID) | ORAL | 0 refills | Status: DC

## 2021-08-01 MED ORDER — PREDNISONE 20 MG PO TABS: 40.0000 mg | ORAL_TABLET | Freq: Every day | ORAL | 0 refills | Status: DC

## 2021-08-01 NOTE — ED Triage Notes (Signed)
Patient states that since Monday she has had cold like symptoms  Patient states she is having a mucus draining down her throat and out of her nose  Patient states she has tried Mucinex, Zyrtec and her albuterol inhaler without much relief  Denies Fever

## 2021-08-02 LAB — COVID-19, FLU A+B NAA
Influenza A, NAA: NOT DETECTED
Influenza B, NAA: NOT DETECTED
SARS-CoV-2, NAA: NOT DETECTED

## 2021-08-03 NOTE — ED Provider Notes (Signed)
[facility]   [id number] 08/01/21 Arrival Time: 7124  ASSESSMENT & PLAN:  1. Exposure to the flu   2. Acute non-recurrent maxillary sinusitis   3. Acute cough   4. Wheezing   5. Mild intermittent asthma with acute exacerbation    Discussed typical duration of viral illnesses. COVID and influenza negative. OTC symptom care as needed.  Begin: Discharge Medication List as of 08/01/2021  2:02 PM     START taking these medications   Details  benzonatate (TESSALON) 100 MG capsule Take 1 capsule by mouth every 8 (eight) hours for cough., Normal    cefdinir (OMNICEF) 300 MG capsule Take 1 capsule (300 mg total) by mouth 2 (two) times daily., Starting Sat 08/01/2021, Normal    predniSONE (DELTASONE) 20 MG tablet Take 2 tablets (40 mg total) by mouth daily., Starting Sat 08/01/2021, Normal         Follow-up Information     Sharilyn Sites, MD.   Specialty: Family Medicine Why: If worsening or failing to improve as anticipated. Contact information: [address] [place] [address] [phone]                 Reviewed expectations re: course of current medical issues. Questions answered. Outlined signs and symptoms indicating need for more acute intervention. Understanding verbalized. After Visit Summary given.   SUBJECTIVE: History from: patient. [name] is a 81 y.o. female who reports: nasal congestion, coughing, fatigue; over one week; now sinus pressure bothering her the most; questions wheezing at times; no SOB/CP. Denies: fever. Normal PO intake without n/v/d.  Social History   Tobacco Use  Smoking Status Former   Types: Cigarettes   Quit date: 12/10/2013   Years since quitting: 7.6  Smokeless Tobacco Never  Tobacco Comments   Smoke 1-1 1/2 packs a day    OBJECTIVE:  Vitals:   08/01/21 1304  BP: 137/78  Pulse: 92  Resp: 16  TempSrc: Oral  SpO2: 95%    General appearance: alert; no distress Eyes: PERRLA; EOMI;  conjunctiva normal HENT: [place]; AT; with nasal congestion; bilat max sinus TTP Neck: supple  Lungs: speaks full sentences without difficulty; unlabored; fine bilat exp wheezing present Extremities: no edema Skin: warm and dry Neurologic: normal gait Psychological: alert and cooperative; normal mood and affect  Labs: Results for orders placed or performed during the hospital encounter of 08/01/21  Covid-19, Flu A+B ([facility])   Specimen: Nasopharyngeal Swab   Naso  Result Value Ref Range   SARS-CoV-2, NAA Not Detected Not Detected   Influenza A, NAA Not Detected Not Detected   Influenza B, NAA Not Detected Not Detected   Test Information: Comment    Labs Reviewed  COVID-19, FLU A+B NAA   Narrative:    Performed at:  [address] [facility] [address], [place], [place]  [id number] Lab Director: Rush Farmer MD, Phone:  [phone]    Imaging: No results found.  Allergies  Allergen Reactions   Alphagan [Brimonidine] Itching   Diflunisal Swelling    Other reaction(s): ENTIRE BODY SWELLING   Vioxx [Rofecoxib] Shortness Of Breath   Metformin And Related     Kidney failure   Nexlizet [Bempedoic Acid-Ezetimibe]     Causes elevated Liver and Kidney function   Pravastatin    Repatha [Evolocumab]     MYALGIAS   Codeine Rash   Elemental Sulfur Rash   Motrin [Ibuprofen] Rash   Penicillins Rash    Past Medical History:  Diagnosis Date  Antral gastritis    EGD 11/15   Asthmatic bronchitis    Back pain    CAD in native artery 03/10/2021   Chronic diastolic heart failure ([place]) 05/20/2015   Grade 2 diastolic dysfunction.  04/2015.   Chronic kidney disease    kidney function low   Diabetes mellitus    x 5 yrs   DVT of axillary vein, acute left (HCC) 07/24/12   GERD (gastroesophageal reflux disease)    Glaucoma    POAG OU   Heart murmur    History of hiatal hernia    History of kidney stones    Hyperlipidemia 05/20/2015   Hypertension    Hypertensive retinopathy     OU   Hypothyroidism    Kidney stones    Macular degeneration    Wet OD, Dry OS   Mixed hyperlipidemia    Peripheral venous insufficiency    Pinched nerve    right elbow   Pneumonia    Sigmoid diverticulitis    Snoring 03/10/2021   Vertigo    chonic   Social History   Socioeconomic History   Marital status: Widowed    Spouse name: Not on file   Number of children: Not on file   Years of education: Not on file   Highest education level: Not on file  Occupational History   Not on file  Tobacco Use   Smoking status: Former    Types: Cigarettes    Quit date: 12/10/2013    Years since quitting: 7.6   Smokeless tobacco: Never   Tobacco comments:    Smoke 1-1 1/2 packs a day  Vaping Use   Vaping Use: Never used  Substance and Sexual Activity   Alcohol use: No   Drug use: No   Sexual activity: Not Currently    Birth control/protection: Surgical    Comment: partial hysterectomy  Other Topics Concern   Not on file  Social History Narrative   Not on file   Social Determinants of Health   Financial Resource Strain: Low Risk    Difficulty of Paying Living Expenses: Not hard at all  Food Insecurity: No Food Insecurity   Worried About Charity fundraiser in the Last Year: Never true   [place] in the Last Year: Never true  Transportation Needs: No Transportation Needs   Lack of Transportation (Medical): No   Lack of Transportation (Non-Medical): No  Physical Activity: Inactive   Days of Exercise per Week: 0 days   Minutes of Exercise per Session: 0 min  Stress: Not on file  Social Connections: Not on file  Intimate Partner Violence: Not At Risk   Fear of Current or Ex-Partner: No   Emotionally Abused: No   Physically Abused: No   Sexually Abused: No   Family History  Problem Relation Age of Onset   Other Mother 32       Cause unknown   Cancer Mother        liver   Heart attack Brother 38       deceased   CVA Maternal Grandmother 90       deceased    Heart disease Maternal Grandmother    Stroke Maternal Grandmother    Breast cancer Maternal Grandmother    Glaucoma Maternal Uncle    Bone cancer Maternal Uncle    Leukemia Maternal Aunt    Breast cancer Sister    Past Surgical History:  Procedure Laterality Date   ABDOMINAL HYSTERECTOMY  BACK SURGERY     spinal    BREAST LUMPECTOMY WITH RADIOACTIVE SEED LOCALIZATION Right 12/03/2020   Procedure: RIGHT BREAST LUMPECTOMY WITH RADIOACTIVE SEED LOCALIZATION;  Surgeon: Donnie Mesa, MD;  Location: [facility];  Service: General;  Laterality: Right;   CARDIAC CATHETERIZATION N/A 05/26/2015   Procedure: Left Heart Cath and Coronary Angiography;  Surgeon: Jettie Booze, MD;  Location: [facility];  Service: Cardiovascular;  Laterality: N/A;   CATARACT EXTRACTION Bilateral    CHOLECYSTECTOMY     COLON SURGERY     COLONOSCOPY N/A 12/27/2013   Procedure: COLONOSCOPY;  Surgeon: Rogene Houston, MD;  [facility];  Service: Endoscopy;  Laterality: N/A;  200   COLOSTOMY CLOSURE     ESOPHAGOGASTRODUODENOSCOPY N/A 05/07/2014   Procedure: ESOPHAGOGASTRODUODENOSCOPY (EGD);  Surgeon: Rogene Houston, MD;  [facility];  Service: Endoscopy;  Laterality: N/A;   EYE SURGERY Bilateral    Cat Sx   fracture left foot     HERNIA REPAIR     NM MYOCAR PERF WALL MOTION  01/28/2009   Normal   OTHER SURGICAL HISTORY     colostomy, colostomy reversal, for diverticulitis surgical hernia repair, arm surgery, neck surgery   US ECHOCARDIOGRAPHY  02/11/2010   Mild MR,trace TR & AI     Vanessa Kick, MD 08/03/21 0825

## 2021-08-04 DIAGNOSIS — E1165 Type 2 diabetes mellitus with hyperglycemia: Secondary | ICD-10-CM | POA: Diagnosis not present

## 2021-08-04 DIAGNOSIS — I10 Essential (primary) hypertension: Secondary | ICD-10-CM | POA: Diagnosis not present

## 2021-08-04 DIAGNOSIS — I2510 Atherosclerotic heart disease of native coronary artery without angina pectoris: Secondary | ICD-10-CM | POA: Diagnosis not present

## 2021-08-10 ENCOUNTER — Other Ambulatory Visit: Payer: Self-pay | Admitting: Cardiovascular Disease

## 2021-08-11 NOTE — Telephone Encounter (Signed)
Rx(s) sent to pharmacy electronically.  

## 2021-08-13 ENCOUNTER — Telehealth (HOSPITAL_BASED_OUTPATIENT_CLINIC_OR_DEPARTMENT_OTHER): Payer: Self-pay | Admitting: Cardiovascular Disease

## 2021-08-13 NOTE — Telephone Encounter (Signed)
Will forward to [facility] for follow up

## 2021-08-13 NOTE — Telephone Encounter (Signed)
Follow Up:     [name] is checking on the status of patient's prior authorization for Praluent. She needs this before 2:00 today.

## 2021-08-13 NOTE — Telephone Encounter (Signed)
Returned call and lmom w/alyssa and stated that they were approved pa for praluent 75 mg q2w.

## 2021-08-14 DIAGNOSIS — I10 Essential (primary) hypertension: Secondary | ICD-10-CM | POA: Diagnosis not present

## 2021-08-14 DIAGNOSIS — G4733 Obstructive sleep apnea (adult) (pediatric): Secondary | ICD-10-CM | POA: Diagnosis not present

## 2021-08-20 DIAGNOSIS — E7800 Pure hypercholesterolemia, unspecified: Secondary | ICD-10-CM | POA: Diagnosis not present

## 2021-08-20 DIAGNOSIS — Z5181 Encounter for therapeutic drug level monitoring: Secondary | ICD-10-CM | POA: Diagnosis not present

## 2021-08-20 LAB — LIPID PANEL
Chol/HDL Ratio: 2.7 ratio (ref 0.0–4.4)
Cholesterol, Total: 132 mg/dL (ref 100–199)
HDL: 49 mg/dL (ref >39)
LDL Chol Calc (NIH): 60 mg/dL (ref 0–99)
Triglycerides: 134 mg/dL (ref 0–149)
VLDL Cholesterol Cal: 23 mg/dL (ref 5–40)

## 2021-08-20 LAB — COMPREHENSIVE METABOLIC PANEL
ALT: 16 IU/L (ref 0–32)
AST: 17 IU/L (ref 0–40)
Albumin/Globulin Ratio: 2.2 (ref 1.2–2.2)
Albumin: 4.7 g/dL (ref 3.7–4.7)
Alkaline Phosphatase: 105 IU/L (ref 44–121)
BUN/Creatinine Ratio: 20 (ref 12–28)
BUN: 21 mg/dL (ref 8–27)
Bilirubin Total: 0.4 mg/dL (ref 0.0–1.2)
CO2: 25 mmol/L (ref 20–29)
Calcium: 10 mg/dL (ref 8.7–10.3)
Chloride: 105 mmol/L (ref 96–106)
Creatinine, Ser: 1.07 mg/dL — ABNORMAL HIGH (ref 0.57–1.00)
Globulin, Total: 2.1 g/dL (ref 1.5–4.5)
Glucose: 133 mg/dL — ABNORMAL HIGH (ref 70–99)
Potassium: 4.7 mmol/L (ref 3.5–5.2)
Sodium: 144 mmol/L (ref 134–144)
Total Protein: 6.8 g/dL (ref 6.0–8.5)
eGFR: 53 mL/min/{1.73_m2} — ABNORMAL LOW (ref >59)

## 2021-08-24 ENCOUNTER — Ambulatory Visit (HOSPITAL_COMMUNITY)
Admission: RE | Admit: 2021-08-24 | Discharge: 2021-08-24 | Disposition: A | Discharge status: Home / Self Care | Payer: HMO | Source: Ambulatory Visit | Attending: Internal Medicine | Admitting: Internal Medicine

## 2021-08-24 ENCOUNTER — Other Ambulatory Visit (HOSPITAL_COMMUNITY): Payer: Self-pay | Admitting: Internal Medicine

## 2021-08-24 ENCOUNTER — Other Ambulatory Visit: Payer: Self-pay

## 2021-08-24 DIAGNOSIS — R059 Cough, unspecified: Secondary | ICD-10-CM

## 2021-08-24 DIAGNOSIS — Z6831 Body mass index (BMI) 31.0-31.9, adult: Secondary | ICD-10-CM | POA: Diagnosis not present

## 2021-08-24 DIAGNOSIS — J441 Chronic obstructive pulmonary disease with (acute) exacerbation: Secondary | ICD-10-CM | POA: Diagnosis not present

## 2021-08-24 DIAGNOSIS — K219 Gastro-esophageal reflux disease without esophagitis: Secondary | ICD-10-CM | POA: Diagnosis not present

## 2021-08-24 DIAGNOSIS — N950 Postmenopausal bleeding: Secondary | ICD-10-CM | POA: Diagnosis not present

## 2021-08-24 DIAGNOSIS — E063 Autoimmune thyroiditis: Secondary | ICD-10-CM | POA: Diagnosis not present

## 2021-08-24 DIAGNOSIS — E1165 Type 2 diabetes mellitus with hyperglycemia: Secondary | ICD-10-CM | POA: Diagnosis not present

## 2021-08-25 DIAGNOSIS — G4733 Obstructive sleep apnea (adult) (pediatric): Secondary | ICD-10-CM | POA: Diagnosis not present

## 2021-08-25 DIAGNOSIS — I10 Essential (primary) hypertension: Secondary | ICD-10-CM | POA: Diagnosis not present

## 2021-08-26 ENCOUNTER — Ambulatory Visit (INDEPENDENT_AMBULATORY_CARE_PROVIDER_SITE_OTHER): Payer: HMO | Admitting: *Deleted

## 2021-08-26 DIAGNOSIS — Z5181 Encounter for therapeutic drug level monitoring: Secondary | ICD-10-CM

## 2021-08-26 DIAGNOSIS — I82A12 Acute embolism and thrombosis of left axillary vein: Secondary | ICD-10-CM | POA: Diagnosis not present

## 2021-08-26 LAB — POCT INR: INR: 1.8 — AB (ref 2.0–3.0)

## 2021-08-26 NOTE — Patient Instructions (Signed)
Take warfarin 1 1/2 tablets tonight then resume 1 tablet daily.  On prednisone taper x 4 more days.  Recheck INR in 2 weeks. Call Coumadin clinic for any questions or changes in medications.

## 2021-09-01 DIAGNOSIS — I10 Essential (primary) hypertension: Secondary | ICD-10-CM | POA: Diagnosis not present

## 2021-09-01 DIAGNOSIS — I2510 Atherosclerotic heart disease of native coronary artery without angina pectoris: Secondary | ICD-10-CM | POA: Diagnosis not present

## 2021-09-01 DIAGNOSIS — E1165 Type 2 diabetes mellitus with hyperglycemia: Secondary | ICD-10-CM | POA: Diagnosis not present

## 2021-09-08 NOTE — Progress Notes (Shared)
[facility] Note  09/15/2021     CHIEF COMPLAINT Patient presents for No chief complaint on file.  HISTORY OF PRESENT ILLNESS: [name] is a 81 y.o. female who presents to the clinic today for:   Patient was on two rounds of antibiotics for her chest.  Started Prednisone for fluid in her ears.  BS has been elevated because of the Prednisone.  Last dose was last week.    Referring physician: Sharilyn Sites, MD [address] [place],  [place] [address]  HISTORICAL INFORMATION:  Selected notes from the MEDICAL RECORD NUMBER Referred by Dr. Madelin Headings for concern of SRF OD LEE: 11.27.20 (M. Cotter) [BCVA: OD: 20/80-- OS: 20/60-]  Ocular Hx-glaucoma (latanoprost)  PMH-DM    CURRENT MEDICATIONS: Current Outpatient Medications (Ophthalmic Drugs)  Medication Sig   dorzolamide-timolol (COSOPT) 22.3-6.8 MG/ML ophthalmic solution INSTILL 1 DROP INTO RIGHT EYE TWICE A DAY (Patient taking differently: Place 1 drop into the right eye 2 (two) times daily.)   latanoprost (XALATAN) 0.005 % ophthalmic solution Place 1 drop into both eyes at bedtime.    tetrahydrozoline 0.05 % ophthalmic solution Place 1 drop into both eyes at bedtime.   No current facility-administered medications for this visit. (Ophthalmic Drugs)   Current Outpatient Medications (Other)  Medication Sig   albuterol (PROVENTIL HFA;VENTOLIN HFA) 108 (90 BASE) MCG/ACT inhaler Inhale 2 puffs into the lungs every 4 (four) hours as needed for shortness of breath.   Alirocumab (PRALUENT) 75 MG/ML SOAJ Inject 75 mg into the skin every 14 (fourteen) days.   amitriptyline (ELAVIL) 25 MG tablet Take 25 mg by mouth at bedtime.   anastrozole (ARIMIDEX) 1 MG tablet Take 1 tablet (1 mg total) by mouth daily.   benzonatate (TESSALON) 100 MG capsule Take 1 capsule by mouth every 8 (eight) hours for cough.   Black Cohosh 40 MG CAPS Take 40 mg by mouth 2 (two) times daily.   carvedilol (COREG) 6.25 MG  tablet Take 1 tablet (6.25 mg total) by mouth 2 (two) times daily.   cefdinir (OMNICEF) 300 MG capsule Take 1 capsule (300 mg total) by mouth 2 (two) times daily.   cetirizine (ZYRTEC) 10 MG tablet Take 10 mg by mouth daily.   Cholecalciferol (VITAMIN D-3) 1000 units CAPS Take 1,000 Units by mouth daily.   diazepam (VALIUM) 2 MG tablet Take 2 mg by mouth 2 (two) times daily as needed for anxiety (dizziness).   esomeprazole (NEXIUM) 20 MG capsule Take 20 mg by mouth daily at 12 noon.   FARXIGA 5 MG TABS tablet Take 5 mg by mouth every morning.   furosemide (LASIX) 40 MG tablet TAKE 1 TABLET (40 MG TOTAL) BY MOUTH DAILY. MAY TAKE EXTRA DAILY AS NEEDED FOR SWELLING   gabapentin (NEURONTIN) 300 MG capsule Take 300 mg by mouth 3 (three) times daily.   glimepiride (AMARYL) 2 MG tablet Take 2 mg by mouth daily.   hydrALAZINE (APRESOLINE) 25 MG tablet Take 1 tablet (25 mg total) by mouth in the morning and at bedtime.   isosorbide mononitrate (IMDUR) 30 MG 24 hr tablet TAKE 1 TABLET BY MOUTH  DAILY   meclizine (ANTIVERT) 25 MG tablet Take 25 mg by mouth 2 (two) times daily as needed for dizziness.   Multiple Vitamins-Minerals (PRESERVISION AREDS 2 PO) Take 1 capsule by mouth in the morning and at bedtime.   Omega-3 Fatty Acids (FISH OIL) 1200 MG CAPS Take 1,200 mg by mouth 2 (two) times  daily.   polyethylene glycol (MIRALAX / GLYCOLAX) packet Take 17 g by mouth daily.   potassium chloride SA (KLOR-CON M) 20 MEQ tablet TAKE ONE-HALF TABLET BY  MOUTH DAILY   predniSONE (DELTASONE) 20 MG tablet Take 2 tablets (40 mg total) by mouth daily.   TRADJENTA 5 MG TABS tablet Take 5 mg by mouth daily.   vitamin B-12 (CYANOCOBALAMIN) 100 MCG tablet Take 100 mcg by mouth daily.   warfarin (COUMADIN) 2 MG tablet TAKE ONE TABLET BY MOUTH EVERYDAY AT BEDTIME   No current facility-administered medications for this visit. (Other)   REVIEW OF SYSTEMS:    ALLERGIES Allergies  Allergen Reactions   Alphagan  [Brimonidine] Itching   Diflunisal Swelling    Other reaction(s): ENTIRE BODY SWELLING   Vioxx [Rofecoxib] Shortness Of Breath   Metformin And Related     Kidney failure   Nexlizet [Bempedoic Acid-Ezetimibe]     Causes elevated Liver and Kidney function   Pravastatin    Repatha [Evolocumab]     MYALGIAS   Codeine Rash   Elemental Sulfur Rash   Motrin [Ibuprofen] Rash   Penicillins Rash    PAST MEDICAL HISTORY Past Medical History:  Diagnosis Date   Antral gastritis    EGD 11/15   Asthmatic bronchitis    Back pain    CAD in native artery 03/10/2021   Chronic diastolic heart failure ([place]) 05/20/2015   Grade 2 diastolic dysfunction.  04/2015.   Chronic kidney disease    kidney function low   Diabetes mellitus    x 5 yrs   DVT of axillary vein, acute left (HCC) 07/24/12   GERD (gastroesophageal reflux disease)    Glaucoma    POAG OU   Heart murmur    History of hiatal hernia    History of kidney stones    Hyperlipidemia 05/20/2015   Hypertension    Hypertensive retinopathy    OU   Hypothyroidism    Kidney stones    Macular degeneration    Wet OD, Dry OS   Mixed hyperlipidemia    Peripheral venous insufficiency    Pinched nerve    right elbow   Pneumonia    Sigmoid diverticulitis    Snoring 03/10/2021   Vertigo    chonic   Past Surgical History:  Procedure Laterality Date   ABDOMINAL HYSTERECTOMY     BACK SURGERY     spinal    BREAST LUMPECTOMY WITH RADIOACTIVE SEED LOCALIZATION Right 12/03/2020   Procedure: RIGHT BREAST LUMPECTOMY WITH RADIOACTIVE SEED LOCALIZATION;  Surgeon: Donnie Mesa, MD;  Location: [facility];  Service: General;  Laterality: Right;   CARDIAC CATHETERIZATION N/A 05/26/2015   Procedure: Left Heart Cath and Coronary Angiography;  Surgeon: Jettie Booze, MD;  Location: [facility];  Service: Cardiovascular;  Laterality: N/A;   CATARACT EXTRACTION Bilateral    CHOLECYSTECTOMY     COLON SURGERY     COLONOSCOPY N/A 12/27/2013    Procedure: COLONOSCOPY;  Surgeon: Rogene Houston, MD;  [facility];  Service: Endoscopy;  Laterality: N/A;  200   COLOSTOMY CLOSURE     ESOPHAGOGASTRODUODENOSCOPY N/A 05/07/2014   Procedure: ESOPHAGOGASTRODUODENOSCOPY (EGD);  Surgeon: Rogene Houston, MD;  [facility];  Service: Endoscopy;  Laterality: N/A;   EYE SURGERY Bilateral    Cat Sx   fracture left foot     HERNIA REPAIR     NM MYOCAR PERF WALL MOTION  01/28/2009   Normal   OTHER  SURGICAL HISTORY     colostomy, colostomy reversal, for diverticulitis surgical hernia repair, arm surgery, neck surgery   US ECHOCARDIOGRAPHY  02/11/2010   Mild MR,trace TR & AI   FAMILY HISTORY Family History  Problem Relation Age of Onset   Other Mother 7       Cause unknown   Cancer Mother        liver   Heart attack Brother 48       deceased   CVA Maternal Grandmother 90       deceased   Heart disease Maternal Grandmother    Stroke Maternal Grandmother    Breast cancer Maternal Grandmother    Glaucoma Maternal Uncle    Bone cancer Maternal Uncle    Leukemia Maternal Aunt    Breast cancer Sister    SOCIAL HISTORY Social History   Tobacco Use   Smoking status: Former    Types: Cigarettes    Quit date: 12/10/2013    Years since quitting: 7.7   Smokeless tobacco: Never   Tobacco comments:    Smoke 1-1 1/2 packs a day  Vaping Use   Vaping Use: Never used  Substance Use Topics   Alcohol use: No   Drug use: No       OPHTHALMIC EXAM: Not recorded    IMAGING AND PROCEDURES  Imaging and Procedures for '@TODAY'$ @            ASSESSMENT/PLAN:   ICD-10-CM   1. Exudative age-related macular degeneration of right eye with active choroidal neovascularization ([place])  H35.3211     2. Intermediate stage nonexudative age-related macular degeneration of left eye  H35.3122     3. Diabetes mellitus type 2 without retinopathy ([place])  E11.9     4. Essential hypertension  I10     5. Hypertensive retinopathy of  both eyes  H35.033     6. Pseudophakia of both eyes  Z96.1     7. PCO (posterior capsular opacification), right  H26.491     8. Primary open angle glaucoma of both eyes, unspecified glaucoma stage  H40.1130      1. 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 Age related macular degeneration, non-exudative, OS  - intermediate stage  - The incidence, anatomy, and pathology of dry AMD, risk of progression, and the AREDS and AREDS 2 study including smoking risks discussed with patient.  - recommend Amsler grid monitoring  3.  Diabetes mellitus, type 2 without retinopathy  - The incidence, risk factors for progression, natural history and treatment options for diabetic retinopathy  were discussed with patient.    - The need for close monitoring of blood glucose, blood pressure, and serum lipids, avoiding cigarette or any type of tobacco, and the need for long term follow up was also discussed with patient.  - monitor  4,5. Hypertensive retinopathy OU  - discussed  importance of tight BP control  - monitor  6,7. Pseudophakia OU, PCO OD  - s/p CE/IOL OU (Dr. Venetia Maxon)  - IOL in good position  - s/p yag cap OD (04.06.22) -- good PC opening  - monitor  8. POAG OU  - formerly managed by Dr. Venetia Maxon  - s/p laser w/ Dr. Venetia Maxon -- ?SLT  - IOP 24, 17 (could have been from the Prednisone)  - currently on latanoprost QHS OU  - cont Cosopt BID OD only  - monitor  Ophthalmic Meds Ordered this visit:  No orders of the defined types were placed in this encounter.    No follow-ups on file.  There are no Patient Instructions on file for this visit.  This document serves as a record of services personally performed by Gardiner Sleeper, MD, PhD. It was created on their behalf by San Jetty. Owens Shark, OA an ophthalmic technician. The creation of this record is the provider's dictation and/or activities during the visit.    Electronically signed by: San Jetty. Owens Shark, [place] 03.07.2023 10:22 AM    Gardiner Sleeper, M.D., Ph.D. Diseases & Surgery of the Retina and Vitreous [facility]: M myopia (nearsighted); A astigmatism; H hyperopia (farsighted); P presbyopia; Mrx spectacle prescription;  CTL contact lenses; OD right eye; OS left eye; OU both eyes  XT exotropia; ET esotropia; PEK punctate epithelial keratitis; PEE punctate epithelial erosions; DES dry eye syndrome; MGD meibomian gland dysfunction; ATs artificial tears; PFAT's preservative free artificial tears; [place] nuclear sclerotic cataract; PSC posterior subcapsular cataract; ERM epi-retinal membrane; PVD posterior vitreous detachment; RD retinal detachment; DM diabetes mellitus; DR diabetic retinopathy; NPDR non-proliferative diabetic retinopathy; PDR proliferative diabetic retinopathy; CSME clinically significant macular edema; DME diabetic macular edema; dbh dot blot hemorrhages; CWS cotton wool spot; POAG primary open angle glaucoma; C/D cup-to-disc ratio; HVF humphrey  visual field; GVF goldmann visual field; OCT optical coherence tomography; IOP intraocular pressure; BRVO Branch retinal vein occlusion; CRVO central retinal vein occlusion; CRAO central retinal artery occlusion; BRAO branch retinal artery occlusion; RT retinal tear; SB scleral buckle; PPV pars plana vitrectomy; VH Vitreous hemorrhage; PRP panretinal laser photocoagulation; IVK intravitreal kenalog; VMT vitreomacular traction; MH Macular hole;  NVD neovascularization of the disc; NVE neovascularization elsewhere; AREDS age related eye disease study; ARMD age related macular degeneration; POAG primary open angle glaucoma; EBMD epithelial/anterior basement membrane dystrophy; ACIOL anterior chamber intraocular lens; IOL intraocular lens; PCIOL posterior chamber intraocular lens; Phaco/IOL phacoemulsification with intraocular lens placement; [place] photorefractive keratectomy; LASIK laser assisted in situ keratomileusis; HTN hypertension; DM diabetes mellitus; COPD chronic obstructive pulmonary disease

## 2021-09-09 ENCOUNTER — Ambulatory Visit (INDEPENDENT_AMBULATORY_CARE_PROVIDER_SITE_OTHER): Payer: HMO | Admitting: *Deleted

## 2021-09-09 DIAGNOSIS — Z5181 Encounter for therapeutic drug level monitoring: Secondary | ICD-10-CM | POA: Diagnosis not present

## 2021-09-09 DIAGNOSIS — I82A12 Acute embolism and thrombosis of left axillary vein: Secondary | ICD-10-CM

## 2021-09-09 LAB — POCT INR: INR: 2.7 (ref 2.0–3.0)

## 2021-09-09 NOTE — Patient Instructions (Signed)
Continue warfarin 1 tablet daily.  ? Recheck INR in 4 weeks.  ?Call Coumadin clinic for any questions or changes in medications.  ?

## 2021-09-11 DIAGNOSIS — G4733 Obstructive sleep apnea (adult) (pediatric): Secondary | ICD-10-CM | POA: Diagnosis not present

## 2021-09-11 DIAGNOSIS — I10 Essential (primary) hypertension: Secondary | ICD-10-CM | POA: Diagnosis not present

## 2021-09-14 ENCOUNTER — Ambulatory Visit
Admission: RE | Admit: 2021-09-14 | Discharge: 2021-09-14 | Disposition: A | Discharge status: Home / Self Care | Payer: HMO | Source: Ambulatory Visit | Attending: Adult Health | Admitting: Adult Health

## 2021-09-14 DIAGNOSIS — D0511 Intraductal carcinoma in situ of right breast: Secondary | ICD-10-CM

## 2021-09-14 DIAGNOSIS — Z853 Personal history of malignant neoplasm of breast: Secondary | ICD-10-CM | POA: Diagnosis not present

## 2021-09-14 DIAGNOSIS — R928 Other abnormal and inconclusive findings on diagnostic imaging of breast: Secondary | ICD-10-CM | POA: Diagnosis not present

## 2021-09-15 ENCOUNTER — Encounter (INDEPENDENT_AMBULATORY_CARE_PROVIDER_SITE_OTHER): Payer: HMO | Admitting: Ophthalmology

## 2021-09-15 DIAGNOSIS — H401130 Primary open-angle glaucoma, bilateral, stage unspecified: Secondary | ICD-10-CM

## 2021-09-15 DIAGNOSIS — H353211 Exudative age-related macular degeneration, right eye, with active choroidal neovascularization: Secondary | ICD-10-CM

## 2021-09-15 DIAGNOSIS — E119 Type 2 diabetes mellitus without complications: Secondary | ICD-10-CM

## 2021-09-15 DIAGNOSIS — H35033 Hypertensive retinopathy, bilateral: Secondary | ICD-10-CM

## 2021-09-15 DIAGNOSIS — I10 Essential (primary) hypertension: Secondary | ICD-10-CM

## 2021-09-15 DIAGNOSIS — Z961 Presence of intraocular lens: Secondary | ICD-10-CM

## 2021-09-15 DIAGNOSIS — H26491 Other secondary cataract, right eye: Secondary | ICD-10-CM

## 2021-09-15 DIAGNOSIS — H353122 Nonexudative age-related macular degeneration, left eye, intermediate dry stage: Secondary | ICD-10-CM

## 2021-09-17 ENCOUNTER — Ambulatory Visit: Payer: HMO | Admitting: Obstetrics & Gynecology

## 2021-09-17 ENCOUNTER — Telehealth: Payer: Self-pay | Admitting: *Deleted

## 2021-09-17 ENCOUNTER — Other Ambulatory Visit: Payer: Self-pay

## 2021-09-17 ENCOUNTER — Encounter: Payer: Self-pay | Admitting: Obstetrics & Gynecology

## 2021-09-17 VITALS — BP 137/59 | HR 73 | Ht 62.25 in | Wt 168.8 lb

## 2021-09-17 DIAGNOSIS — N898 Other specified noninflammatory disorders of vagina: Secondary | ICD-10-CM | POA: Diagnosis not present

## 2021-09-17 NOTE — Telephone Encounter (Signed)
Pt called stating she got a pain in her stomach, went to the bathroom and she was bleeding again. I spoke with Dr. Nelda Marseille and she advised to use coconut oil. It may take a few days for it to stop. Pt is on blood thinners and was advised not to stop blood thinners. Pt voiced understanding. JSY ?

## 2021-09-17 NOTE — Progress Notes (Signed)
? ?  GYN VISIT ?Patient name: [name] MRN [id number]  Date of birth: [birth year] ?Chief Complaint:   ?postmenopausal bleeding ? ?History of Present Illness:   ?[name] is a 81 y.o. G3P3 PM, [place] female being seen today for the following concerns: ? ?PMB:  Back in Jan. After a recent urgent care visit, noted bright red vaginal bleeding.  Similar to a heavy period- lasted about 1-2 days.   ?Several times since then she has noted occasional spotting, mostly pink.  Denies pelvic pain.  Denies discharge. ? ?No urinary complaints. No change in bowel movements. ? ?No LMP recorded. Patient has had a hysterectomy. ? ?Depression screen [facility] 2/9 09/17/2021  ?Decreased Interest 1  ?Down, Depressed, Hopeless 1  ?PHQ - 2 Score 2  ?Altered sleeping 0  ?Tired, decreased energy 1  ?Change in appetite 0  ?Feeling bad or failure about yourself  0  ?Trouble concentrating 0  ?Moving slowly or fidgety/restless 0  ?Suicidal thoughts 0  ?PHQ-9 Score 3  ?Some recent data might be hidden  ? ? ? ?Review of Systems:   ?Pertinent items are noted in HPI ?Denies fever/chills, dizziness, headaches, visual disturbances, fatigue, shortness of breath, chest pain, abdominal pain, vomiting,  bowel movements, urination or unless otherwise stated above.  ?Pertinent History Reviewed:  ?Reviewed past medical,surgical, social, obstetrical and family history.  ?Reviewed problem list, medications and allergies. ?Physical Assessment:  ? ?Vitals:  ? 09/17/21 1028  ?BP: (!) 137/59  ?Pulse: 73  ?Weight: 168 lb 12.8 oz (76.6 kg)  ?Height: 5' 2.25" (1.581 m)  ?Body mass index is 30.63 kg/m?. ? ?     Physical Examination:  ? General appearance: alert, well appearing, and in no distress ? Psych: mood appropriate, normal affect ? Skin: warm & dry  ? Cardiovascular: normal heart rate noted ? Respiratory: normal respiratory effort, no distress ? Abdomen: soft, non-tender  ? Pelvic: VULVA: normal appearing vulva with no masses, tenderness or lesions, VAGINA:  posterior periurethral mucosa and ~ 1cm area on left vaginal side wall near introitus with evidence of bleeding-tissue appears friable, no discrete mass or lesions.  Vaginal cuff appears normal, no other vaginal mucosa abnormalities noted.  Silver nitrazine applied sparingly ? Extremities: no edema  ? ?Chaperone: Levy Pupa   ? ?Assessment & Plan:  ?1) Vaginal irritation ?-encouraged vaginal moisturizers ?-suspect bleeding will now resolve, f/u prn ? ? ?Return if symptoms worsen or fail to improve. ? ? ?Janyth Pupa, DO ?Attending [place], Faculty Practice ?[facility] ? ? ? ?

## 2021-09-18 NOTE — Progress Notes (Signed)
?[facility] Note ? ?09/22/2021 ? ?  ? ?CHIEF COMPLAINT ?Patient presents for Retina Follow Up ? ?HISTORY OF PRESENT ILLNESS: ?[name] is a 81 y.o. female who presents to the clinic today for:  ?HPI   ? ? Retina Follow Up   ?Patient presents with  Wet AMD.  In right eye.  This started 11 weeks ago.  I, the attending physician,  performed the HPI with the patient and updated documentation appropriately. ? ?  ?  ? ? Comments   ?Patient here for 11 weeks retina follow up for exu ARMD OD. Patient states vision is blurry. A lot of days it doesn't totally clear up. Stopped driving at night. No eye pain. On nebulizer for machine.  ? ?  ?  ?Last edited by Bernarda Caffey, MD on 09/22/2021  9:35 AM.  ?  ?Patient feels like vision has gotten worse, she is no longer driving at night, she states her peripheral vision is not as good also, she on latanoprost QHS OU and Cosopt BID OD only ? ?Referring physician: ?Sharilyn Sites, MD ?[address] ?[place],  [place] [address] ? ?HISTORICAL INFORMATION:  ?Selected notes from the [place] ?Referred by Dr. Madelin Headings for concern of SRF OD ?LEE: 11.27.20 (M. Cotter) [BCVA: OD: 20/80-- OS: 20/60-]  ?Ocular Hx-glaucoma (latanoprost)  ?PMH-DM   ? ?CURRENT MEDICATIONS: ?Current Outpatient Medications (Ophthalmic Drugs)  ?Medication Sig  ? dorzolamide-timolol (COSOPT) 22.3-6.8 MG/ML ophthalmic solution INSTILL 1 DROP INTO RIGHT EYE TWICE A DAY (Patient taking differently: Place 1 drop into the right eye 2 (two) times daily.)  ? latanoprost (XALATAN) 0.005 % ophthalmic solution Place 1 drop into both eyes at bedtime.   ? tetrahydrozoline 0.05 % ophthalmic solution Place 1 drop into both eyes at bedtime.  ? ?No current facility-administered medications for this visit. (Ophthalmic Drugs)  ? ?Current Outpatient Medications (Other)  ?Medication Sig  ? albuterol (PROVENTIL HFA;VENTOLIN HFA) 108 (90 BASE) MCG/ACT inhaler Inhale 2 puffs into the lungs  every 4 (four) hours as needed for shortness of breath.  ? Alirocumab (PRALUENT) 75 MG/ML SOAJ Inject 75 mg into the skin every 14 (fourteen) days.  ? amitriptyline (ELAVIL) 25 MG tablet Take 25 mg by mouth at bedtime.  ? anastrozole (ARIMIDEX) 1 MG tablet Take 1 tablet (1 mg total) by mouth daily.  ? Black Cohosh 40 MG CAPS Take 40 mg by mouth 2 (two) times daily.  ? carvedilol (COREG) 6.25 MG tablet Take 1 tablet (6.25 mg total) by mouth 2 (two) times daily.  ? cetirizine (ZYRTEC) 10 MG tablet Take 10 mg by mouth daily.  ? Cholecalciferol (VITAMIN D-3) 1000 units CAPS Take 1,000 Units by mouth daily.  ? diazepam (VALIUM) 2 MG tablet Take 2 mg by mouth 2 (two) times daily as needed for anxiety (dizziness).  ? esomeprazole (NEXIUM) 20 MG capsule Take 20 mg by mouth daily at 12 noon.  ? FARXIGA 5 MG TABS tablet Take 5 mg by mouth every morning.  ? furosemide (LASIX) 40 MG tablet TAKE 1 TABLET (40 MG TOTAL) BY MOUTH DAILY. MAY TAKE EXTRA DAILY AS NEEDED FOR SWELLING  ? gabapentin (NEURONTIN) 300 MG capsule Take 300 mg by mouth 3 (three) times daily.  ? hydrALAZINE (APRESOLINE) 25 MG tablet Take 1 tablet (25 mg total) by mouth in the morning and at bedtime.  ? isosorbide mononitrate (IMDUR) 30 MG 24 hr tablet TAKE 1 TABLET BY MOUTH  DAILY  ?  meclizine (ANTIVERT) 25 MG tablet Take 25 mg by mouth 2 (two) times daily as needed for dizziness.  ? Multiple Vitamins-Minerals (PRESERVISION AREDS 2 PO) Take 1 capsule by mouth in the morning and at bedtime.  ? Omega-3 Fatty Acids (FISH OIL) 1200 MG CAPS Take 1,200 mg by mouth 2 (two) times daily.  ? polyethylene glycol (MIRALAX / GLYCOLAX) packet Take 17 g by mouth daily.  ? potassium chloride SA (KLOR-CON M) 20 MEQ tablet TAKE ONE-HALF TABLET BY  MOUTH DAILY  ? TRADJENTA 5 MG TABS tablet Take 5 mg by mouth daily.  ? vitamin B-12 (CYANOCOBALAMIN) 100 MCG tablet Take 100 mcg by mouth daily.  ? warfarin (COUMADIN) 2 MG tablet TAKE ONE TABLET BY MOUTH EVERYDAY AT BEDTIME  ?  glimepiride (AMARYL) 2 MG tablet Take 2 mg by mouth daily. (Patient not taking: Reported on 09/17/2021)  ? ?No current facility-administered medications for this visit. (Other)  ? ?REVIEW OF SYSTEMS: ?ROS   ?Positive for: Genitourinary, Endocrine, Cardiovascular, Eyes ?Negative for: Constitutional, Gastrointestinal, Neurological, Skin, Musculoskeletal, HENT, Respiratory, Psychiatric, Allergic/Imm, Heme/Lymph ?Last edited by Theodore Demark, COA on 09/22/2021  9:11 AM.  ?  ? ? ? ?ALLERGIES ?Allergies  ?Allergen Reactions  ? Alphagan [Brimonidine] Itching  ? Diflunisal Swelling  ?  Other reaction(s): ENTIRE BODY SWELLING  ? Vioxx [Rofecoxib] Shortness Of Breath  ? Metformin And Related   ?  Kidney failure  ? Nexlizet [Bempedoic Acid-Ezetimibe]   ?  Causes elevated Liver and Kidney function  ? Pravastatin   ? Repatha [Evolocumab]   ?  MYALGIAS  ? Codeine Rash  ? Elemental Sulfur Rash  ? Motrin [Ibuprofen] Rash  ? Penicillins Rash  ? ? ?PAST MEDICAL HISTORY ?Past Medical History:  ?Diagnosis Date  ? Antral gastritis   ? EGD 11/15  ? Asthmatic bronchitis   ? Back pain   ? Breast cancer ([employer])   ? right breast  ? CAD in native artery 03/10/2021  ? Chronic diastolic heart failure ([place]) 05/20/2015  ? Grade 2 diastolic dysfunction.  04/2015.  ? Chronic kidney disease   ? kidney function low  ? Diabetes mellitus   ? x 5 yrs  ? DVT of axillary vein, acute left ([place]) 07/24/2012  ? GERD (gastroesophageal reflux disease)   ? Glaucoma   ? POAG OU  ? Heart murmur   ? History of hiatal hernia   ? History of kidney stones   ? Hyperlipidemia 05/20/2015  ? Hypertension   ? Hypertensive retinopathy   ? OU  ? Hypothyroidism   ? Kidney stones   ? Macular degeneration   ? Wet OD, Dry OS  ? Mixed hyperlipidemia   ? Peripheral venous insufficiency   ? Pinched nerve   ? right elbow  ? Pneumonia   ? Sigmoid diverticulitis   ? Snoring 03/10/2021  ? Vertigo   ? chonic  ? ?Past Surgical History:  ?Procedure Laterality Date  ? ABDOMINAL  HYSTERECTOMY    ? BACK SURGERY    ? spinal   ? BREAST LUMPECTOMY WITH RADIOACTIVE SEED LOCALIZATION Right 12/03/2020  ? Procedure: RIGHT BREAST LUMPECTOMY WITH RADIOACTIVE SEED LOCALIZATION;  Surgeon: Donnie Mesa, MD;  Location: [facility];  Service: General;  Laterality: Right;  ? CARDIAC CATHETERIZATION N/A 05/26/2015  ? Procedure: Left Heart Cath and Coronary Angiography;  Surgeon: Jettie Booze, MD;  Location: [facility];  Service: Cardiovascular;  Laterality: N/A;  ? CATARACT EXTRACTION Bilateral   ? CHOLECYSTECTOMY    ?  COLON SURGERY    ? COLONOSCOPY N/A 12/27/2013  ? Procedure: COLONOSCOPY;  Surgeon: Rogene Houston, MD;  [facility];  Service: Endoscopy;  Laterality: N/A;  200  ? COLOSTOMY CLOSURE    ? ESOPHAGOGASTRODUODENOSCOPY N/A 05/07/2014  ? Procedure: ESOPHAGOGASTRODUODENOSCOPY (EGD);  Surgeon: Rogene Houston, MD;  [facility];  Service: Endoscopy;  Laterality: N/A;  ? EYE SURGERY Bilateral   ? Cat Sx  ? fracture left foot    ? HERNIA REPAIR    ? NM MYOCAR PERF WALL MOTION  01/28/2009  ? Normal  ? OTHER SURGICAL HISTORY    ? colostomy, colostomy reversal, for diverticulitis surgical hernia repair, arm surgery, neck surgery  ? US ECHOCARDIOGRAPHY  02/11/2010  ? Mild MR,trace TR & AI  ? ?FAMILY HISTORY ?Family History  ?Problem Relation Age of Onset  ? CVA Maternal Grandmother 90  ?     deceased  ? Heart disease Maternal Grandmother   ? Stroke Maternal Grandmother   ? Breast cancer Maternal Grandmother   ? Other Mother 22  ?     Cause unknown  ? Cancer Mother   ?     liver  ? Heart attack Brother 76  ?     deceased  ? Breast cancer Sister   ? Leukemia Maternal Aunt   ? Glaucoma Maternal Uncle   ? Bone cancer Maternal Uncle   ? Spina bifida Daughter   ? ?SOCIAL HISTORY ?Social History  ? ?Tobacco Use  ? Smoking status: Former  ?  Types: Cigarettes  ?  Quit date: 12/10/2013  ?  Years since quitting: 7.7  ? Smokeless tobacco: Never  ? Tobacco comments:  ?  Smoke 1-1 1/2 packs a  day  ?Vaping Use  ? Vaping Use: Never used  ?Substance Use Topics  ? Alcohol use: No  ? Drug use: No  ?  ? ?  ?OPHTHALMIC EXAM: ?Base Eye Exam   ? ? Visual Acuity (Snellen - Linear)   ? ?   Right Left  ? Dist [place] 20/50

## 2021-09-22 ENCOUNTER — Encounter (INDEPENDENT_AMBULATORY_CARE_PROVIDER_SITE_OTHER): Payer: Self-pay | Admitting: Ophthalmology

## 2021-09-22 ENCOUNTER — Other Ambulatory Visit: Payer: Self-pay

## 2021-09-22 ENCOUNTER — Ambulatory Visit (INDEPENDENT_AMBULATORY_CARE_PROVIDER_SITE_OTHER): Payer: HMO | Admitting: Ophthalmology

## 2021-09-22 DIAGNOSIS — H353211 Exudative age-related macular degeneration, right eye, with active choroidal neovascularization: Secondary | ICD-10-CM | POA: Diagnosis not present

## 2021-09-22 DIAGNOSIS — Z961 Presence of intraocular lens: Secondary | ICD-10-CM | POA: Diagnosis not present

## 2021-09-22 DIAGNOSIS — H26491 Other secondary cataract, right eye: Secondary | ICD-10-CM | POA: Diagnosis not present

## 2021-09-22 DIAGNOSIS — I10 Essential (primary) hypertension: Secondary | ICD-10-CM | POA: Diagnosis not present

## 2021-09-22 DIAGNOSIS — H401130 Primary open-angle glaucoma, bilateral, stage unspecified: Secondary | ICD-10-CM

## 2021-09-22 DIAGNOSIS — E119 Type 2 diabetes mellitus without complications: Secondary | ICD-10-CM

## 2021-09-22 DIAGNOSIS — H35033 Hypertensive retinopathy, bilateral: Secondary | ICD-10-CM | POA: Diagnosis not present

## 2021-09-22 DIAGNOSIS — H353122 Nonexudative age-related macular degeneration, left eye, intermediate dry stage: Secondary | ICD-10-CM

## 2021-09-29 ENCOUNTER — Other Ambulatory Visit: Payer: Self-pay | Admitting: Cardiovascular Disease

## 2021-09-29 DIAGNOSIS — H353122 Nonexudative age-related macular degeneration, left eye, intermediate dry stage: Secondary | ICD-10-CM | POA: Diagnosis not present

## 2021-09-29 DIAGNOSIS — H401113 Primary open-angle glaucoma, right eye, severe stage: Secondary | ICD-10-CM | POA: Diagnosis not present

## 2021-09-29 DIAGNOSIS — Z961 Presence of intraocular lens: Secondary | ICD-10-CM | POA: Diagnosis not present

## 2021-09-29 DIAGNOSIS — E119 Type 2 diabetes mellitus without complications: Secondary | ICD-10-CM | POA: Diagnosis not present

## 2021-09-29 DIAGNOSIS — H40022 Open angle with borderline findings, high risk, left eye: Secondary | ICD-10-CM | POA: Diagnosis not present

## 2021-09-29 DIAGNOSIS — H353211 Exudative age-related macular degeneration, right eye, with active choroidal neovascularization: Secondary | ICD-10-CM | POA: Diagnosis not present

## 2021-09-29 NOTE — Telephone Encounter (Signed)
Prescription refill request received for warfarin ?Lov: 12/7Oval Linsey  ?Next INR check: 4/5 ?Warfarin tablet strength: '2mg'$   ?

## 2021-09-29 NOTE — Telephone Encounter (Signed)
Warfarin refill ?

## 2021-10-02 DIAGNOSIS — I2510 Atherosclerotic heart disease of native coronary artery without angina pectoris: Secondary | ICD-10-CM | POA: Diagnosis not present

## 2021-10-02 DIAGNOSIS — E1165 Type 2 diabetes mellitus with hyperglycemia: Secondary | ICD-10-CM | POA: Diagnosis not present

## 2021-10-07 ENCOUNTER — Ambulatory Visit (INDEPENDENT_AMBULATORY_CARE_PROVIDER_SITE_OTHER): Payer: HMO | Admitting: *Deleted

## 2021-10-07 DIAGNOSIS — I82A12 Acute embolism and thrombosis of left axillary vein: Secondary | ICD-10-CM

## 2021-10-07 DIAGNOSIS — Z5181 Encounter for therapeutic drug level monitoring: Secondary | ICD-10-CM

## 2021-10-07 LAB — POCT INR: INR: 2 (ref 2.0–3.0)

## 2021-10-07 NOTE — Patient Instructions (Signed)
Continue warfarin 1 tablet daily.  ? Recheck INR in 4 weeks.  ?Call Coumadin clinic for any questions or changes in medications.  ?

## 2021-10-07 NOTE — Progress Notes (Signed)
?[facility] Note ? ?10/13/2021 ? ?  ? ?CHIEF COMPLAINT ?Patient presents for Retina Follow Up ? ?HISTORY OF PRESENT ILLNESS: ?[name] is a 81 y.o. female who presents to the clinic today for:  ?HPI   ? ? Retina Follow Up   ?Patient presents with  Wet AMD.  In right eye.  This started 3.5 weeks ago.  I, the attending physician,  performed the HPI with the patient and updated documentation appropriately. ? ?  ?  ? ? Comments   ?Patient here for 3.5 weeks retina follow up for exu ARMD OD. Patient states vision some days better than other days. No eye pain. Has pain at times right side of temple. Dr Katy Fitch added Rocklatan QHS OD. Still taking Dorzolamide/Timolol  BID OD and Latanoprost QHS OS.  ? ?  ?  ?Last edited by Bernarda Caffey, MD on 10/13/2021 11:41 PM.  ?  ? ?Patient saw Dr. Katy Fitch who lowered her Cosopt to BID OD, added Rocklatan QHS OD and Latanoprost QHS OS, she is not having any problems with the drops ? ?Referring physician: ?Sharilyn Sites, MD ?[address] ?[place],  [place] [address] ? ?HISTORICAL INFORMATION:  ?Selected notes from the [place] ?Referred by Dr. Madelin Headings for concern of SRF OD ?LEE: 11.27.20 (M. Cotter) [BCVA: OD: 20/80-- OS: 20/60-]  ?Ocular Hx-glaucoma (latanoprost)  ?PMH-DM   ? ?CURRENT MEDICATIONS: ?Current Outpatient Medications (Ophthalmic Drugs)  ?Medication Sig  ? dorzolamide-timolol (COSOPT) 22.3-6.8 MG/ML ophthalmic solution INSTILL 1 DROP INTO RIGHT EYE TWICE A DAY (Patient taking differently: Place 1 drop into the right eye 2 (two) times daily.)  ? latanoprost (XALATAN) 0.005 % ophthalmic solution Place 1 drop into both eyes at bedtime.   ? tetrahydrozoline 0.05 % ophthalmic solution Place 1 drop into both eyes at bedtime.  ? ?No current facility-administered medications for this visit. (Ophthalmic Drugs)  ? ?Current Outpatient Medications (Other)  ?Medication Sig  ? albuterol (PROVENTIL HFA;VENTOLIN HFA) 108 (90 BASE) MCG/ACT  inhaler Inhale 2 puffs into the lungs every 4 (four) hours as needed for shortness of breath.  ? Alirocumab (PRALUENT) 75 MG/ML SOAJ Inject 75 mg into the skin every 14 (fourteen) days.  ? amitriptyline (ELAVIL) 25 MG tablet Take 25 mg by mouth at bedtime.  ? anastrozole (ARIMIDEX) 1 MG tablet Take 1 tablet (1 mg total) by mouth daily.  ? Black Cohosh 40 MG CAPS Take 40 mg by mouth 2 (two) times daily.  ? carvedilol (COREG) 6.25 MG tablet Take 1 tablet (6.25 mg total) by mouth 2 (two) times daily.  ? cetirizine (ZYRTEC) 10 MG tablet Take 10 mg by mouth daily.  ? Cholecalciferol (VITAMIN D-3) 1000 units CAPS Take 1,000 Units by mouth daily.  ? diazepam (VALIUM) 2 MG tablet Take 2 mg by mouth 2 (two) times daily as needed for anxiety (dizziness).  ? esomeprazole (NEXIUM) 20 MG capsule Take 20 mg by mouth daily at 12 noon.  ? FARXIGA 5 MG TABS tablet Take 5 mg by mouth every morning.  ? furosemide (LASIX) 40 MG tablet TAKE 1 TABLET (40 MG TOTAL) BY MOUTH DAILY. MAY TAKE EXTRA DAILY AS NEEDED FOR SWELLING  ? gabapentin (NEURONTIN) 300 MG capsule Take 300 mg by mouth 3 (three) times daily.  ? hydrALAZINE (APRESOLINE) 25 MG tablet Take 1 tablet (25 mg total) by mouth in the morning and at bedtime.  ? isosorbide mononitrate (IMDUR) 30 MG 24 hr tablet TAKE 1 TABLET BY  MOUTH  DAILY  ? meclizine (ANTIVERT) 25 MG tablet Take 25 mg by mouth 2 (two) times daily as needed for dizziness.  ? Multiple Vitamins-Minerals (PRESERVISION AREDS 2 PO) Take 1 capsule by mouth in the morning and at bedtime.  ? Omega-3 Fatty Acids (FISH OIL) 1200 MG CAPS Take 1,200 mg by mouth 2 (two) times daily.  ? polyethylene glycol (MIRALAX / GLYCOLAX) packet Take 17 g by mouth daily.  ? potassium chloride SA (KLOR-CON M) 20 MEQ tablet TAKE ONE-HALF TABLET BY  MOUTH DAILY  ? TRADJENTA 5 MG TABS tablet Take 5 mg by mouth daily.  ? vitamin B-12 (CYANOCOBALAMIN) 100 MCG tablet Take 100 mcg by mouth daily.  ? warfarin (COUMADIN) 2 MG tablet Take 1 tablet by  mouth daily as directed by the coumadin clinic  ? glimepiride (AMARYL) 2 MG tablet Take 2 mg by mouth daily. (Patient not taking: Reported on 09/17/2021)  ? ?No current facility-administered medications for this visit. (Other)  ? ?REVIEW OF SYSTEMS: ?ROS   ?Positive for: Genitourinary, Endocrine, Cardiovascular, Eyes ?Negative for: Constitutional, Gastrointestinal, Neurological, Skin, Musculoskeletal, HENT, Respiratory, Psychiatric, Allergic/Imm, Heme/Lymph ?Last edited by Theodore Demark, COA on 10/13/2021  9:05 AM.  ?  ? ?ALLERGIES ?Allergies  ?Allergen Reactions  ? Alphagan [Brimonidine] Itching  ? Diflunisal Swelling  ?  Other reaction(s): ENTIRE BODY SWELLING  ? Vioxx [Rofecoxib] Shortness Of Breath  ? Metformin And Related   ?  Kidney failure  ? Nexlizet [Bempedoic Acid-Ezetimibe]   ?  Causes elevated Liver and Kidney function  ? Pravastatin   ? Repatha [Evolocumab]   ?  MYALGIAS  ? Codeine Rash  ? Elemental Sulfur Rash  ? Motrin [Ibuprofen] Rash  ? Penicillins Rash  ? ?PAST MEDICAL HISTORY ?Past Medical History:  ?Diagnosis Date  ? Antral gastritis   ? EGD 11/15  ? Asthmatic bronchitis   ? Back pain   ? Breast cancer ([place])   ? right breast  ? CAD in native artery 03/10/2021  ? Chronic diastolic heart failure ([place]) 05/20/2015  ? Grade 2 diastolic dysfunction.  04/2015.  ? Chronic kidney disease   ? kidney function low  ? Diabetes mellitus   ? x 5 yrs  ? DVT of axillary vein, acute left ([place]) 07/24/2012  ? GERD (gastroesophageal reflux disease)   ? Glaucoma   ? POAG OU  ? Heart murmur   ? History of hiatal hernia   ? History of kidney stones   ? Hyperlipidemia 05/20/2015  ? Hypertension   ? Hypertensive retinopathy   ? OU  ? Hypothyroidism   ? Kidney stones   ? Macular degeneration   ? Wet OD, Dry OS  ? Mixed hyperlipidemia   ? Peripheral venous insufficiency   ? Pinched nerve   ? right elbow  ? Pneumonia   ? Sigmoid diverticulitis   ? Snoring 03/10/2021  ? Vertigo   ? chonic  ? ?Past Surgical History:   ?Procedure Laterality Date  ? ABDOMINAL HYSTERECTOMY    ? BACK SURGERY    ? spinal   ? BREAST LUMPECTOMY WITH RADIOACTIVE SEED LOCALIZATION Right 12/03/2020  ? Procedure: RIGHT BREAST LUMPECTOMY WITH RADIOACTIVE SEED LOCALIZATION;  Surgeon: Donnie Mesa, MD;  Location: [facility];  Service: General;  Laterality: Right;  ? CARDIAC CATHETERIZATION N/A 05/26/2015  ? Procedure: Left Heart Cath and Coronary Angiography;  Surgeon: Jettie Booze, MD;  Location: [facility];  Service: Cardiovascular;  Laterality: N/A;  ? CATARACT EXTRACTION Bilateral   ?  CHOLECYSTECTOMY    ? COLON SURGERY    ? COLONOSCOPY N/A 12/27/2013  ? Procedure: COLONOSCOPY;  Surgeon: Rogene Houston, MD;  [facility];  Service: Endoscopy;  Laterality: N/A;  200  ? COLOSTOMY CLOSURE    ? ESOPHAGOGASTRODUODENOSCOPY N/A 05/07/2014  ? Procedure: ESOPHAGOGASTRODUODENOSCOPY (EGD);  Surgeon: Rogene Houston, MD;  [facility];  Service: Endoscopy;  Laterality: N/A;  ? EYE SURGERY Bilateral   ? Cat Sx  ? fracture left foot    ? HERNIA REPAIR    ? NM MYOCAR PERF WALL MOTION  01/28/2009  ? Normal  ? OTHER SURGICAL HISTORY    ? colostomy, colostomy reversal, for diverticulitis surgical hernia repair, arm surgery, neck surgery  ? US ECHOCARDIOGRAPHY  02/11/2010  ? Mild MR,trace TR & AI  ? ?FAMILY HISTORY ?Family History  ?Problem Relation Age of Onset  ? CVA Maternal Grandmother 90  ?     deceased  ? Heart disease Maternal Grandmother   ? Stroke Maternal Grandmother   ? Breast cancer Maternal Grandmother   ? Other Mother 37  ?     Cause unknown  ? Cancer Mother   ?     liver  ? Heart attack Brother 65  ?     deceased  ? Breast cancer Sister   ? Leukemia Maternal Aunt   ? Glaucoma Maternal Uncle   ? Bone cancer Maternal Uncle   ? Spina bifida Daughter   ? ?SOCIAL HISTORY ?Social History  ? ?Tobacco Use  ? Smoking status: Former  ?  Types: Cigarettes  ?  Quit date: 12/10/2013  ?  Years since quitting: 7.8  ? Smokeless tobacco: Never  ?  Tobacco comments:  ?  Smoke 1-1 1/2 packs a day  ?Vaping Use  ? Vaping Use: Never used  ?Substance Use Topics  ? Alcohol use: No  ? Drug use: No  ?  ? ?  ?OPHTHALMIC EXAM: ?Base Eye Exam   ? ? Visual Acuity (Sne

## 2021-10-12 DIAGNOSIS — G4733 Obstructive sleep apnea (adult) (pediatric): Secondary | ICD-10-CM | POA: Diagnosis not present

## 2021-10-12 DIAGNOSIS — I10 Essential (primary) hypertension: Secondary | ICD-10-CM | POA: Diagnosis not present

## 2021-10-13 ENCOUNTER — Ambulatory Visit (INDEPENDENT_AMBULATORY_CARE_PROVIDER_SITE_OTHER): Payer: HMO | Admitting: Ophthalmology

## 2021-10-13 ENCOUNTER — Encounter (HOSPITAL_COMMUNITY): Payer: Self-pay

## 2021-10-13 ENCOUNTER — Encounter (INDEPENDENT_AMBULATORY_CARE_PROVIDER_SITE_OTHER): Payer: Self-pay | Admitting: Ophthalmology

## 2021-10-13 DIAGNOSIS — H26491 Other secondary cataract, right eye: Secondary | ICD-10-CM

## 2021-10-13 DIAGNOSIS — H401130 Primary open-angle glaucoma, bilateral, stage unspecified: Secondary | ICD-10-CM | POA: Diagnosis not present

## 2021-10-13 DIAGNOSIS — H353122 Nonexudative age-related macular degeneration, left eye, intermediate dry stage: Secondary | ICD-10-CM | POA: Diagnosis not present

## 2021-10-13 DIAGNOSIS — E119 Type 2 diabetes mellitus without complications: Secondary | ICD-10-CM

## 2021-10-13 DIAGNOSIS — I10 Essential (primary) hypertension: Secondary | ICD-10-CM | POA: Diagnosis not present

## 2021-10-13 DIAGNOSIS — Z961 Presence of intraocular lens: Secondary | ICD-10-CM | POA: Diagnosis not present

## 2021-10-13 DIAGNOSIS — H353211 Exudative age-related macular degeneration, right eye, with active choroidal neovascularization: Secondary | ICD-10-CM | POA: Diagnosis not present

## 2021-10-13 DIAGNOSIS — H35033 Hypertensive retinopathy, bilateral: Secondary | ICD-10-CM | POA: Diagnosis not present

## 2021-10-29 DIAGNOSIS — H401113 Primary open-angle glaucoma, right eye, severe stage: Secondary | ICD-10-CM | POA: Diagnosis not present

## 2021-11-01 ENCOUNTER — Other Ambulatory Visit: Payer: Self-pay | Admitting: Cardiovascular Disease

## 2021-11-01 DIAGNOSIS — E1165 Type 2 diabetes mellitus with hyperglycemia: Secondary | ICD-10-CM | POA: Diagnosis not present

## 2021-11-01 DIAGNOSIS — I2510 Atherosclerotic heart disease of native coronary artery without angina pectoris: Secondary | ICD-10-CM | POA: Diagnosis not present

## 2021-11-01 DIAGNOSIS — I10 Essential (primary) hypertension: Secondary | ICD-10-CM | POA: Diagnosis not present

## 2021-11-02 NOTE — Telephone Encounter (Signed)
Rx(s) sent to pharmacy electronically.  

## 2021-11-04 ENCOUNTER — Ambulatory Visit (INDEPENDENT_AMBULATORY_CARE_PROVIDER_SITE_OTHER): Payer: HMO | Admitting: *Deleted

## 2021-11-04 DIAGNOSIS — I82A12 Acute embolism and thrombosis of left axillary vein: Secondary | ICD-10-CM

## 2021-11-04 DIAGNOSIS — Z5181 Encounter for therapeutic drug level monitoring: Secondary | ICD-10-CM | POA: Diagnosis not present

## 2021-11-04 LAB — POCT INR: INR: 2.5 (ref 2.0–3.0)

## 2021-11-04 NOTE — Patient Instructions (Signed)
Description   ?Continue warfarin 1 tablet daily.  ? Recheck INR in 5 weeks.  ?Call Coumadin clinic for any questions or changes in medications.  ?  ? ? ?

## 2021-11-11 DIAGNOSIS — G4733 Obstructive sleep apnea (adult) (pediatric): Secondary | ICD-10-CM | POA: Diagnosis not present

## 2021-11-11 DIAGNOSIS — I10 Essential (primary) hypertension: Secondary | ICD-10-CM | POA: Diagnosis not present

## 2021-11-26 DIAGNOSIS — H353211 Exudative age-related macular degeneration, right eye, with active choroidal neovascularization: Secondary | ICD-10-CM | POA: Diagnosis not present

## 2021-11-26 DIAGNOSIS — Z961 Presence of intraocular lens: Secondary | ICD-10-CM | POA: Diagnosis not present

## 2021-11-26 DIAGNOSIS — H353122 Nonexudative age-related macular degeneration, left eye, intermediate dry stage: Secondary | ICD-10-CM | POA: Diagnosis not present

## 2021-11-26 DIAGNOSIS — E119 Type 2 diabetes mellitus without complications: Secondary | ICD-10-CM | POA: Diagnosis not present

## 2021-11-26 DIAGNOSIS — H401113 Primary open-angle glaucoma, right eye, severe stage: Secondary | ICD-10-CM | POA: Diagnosis not present

## 2021-11-26 DIAGNOSIS — H40022 Open angle with borderline findings, high risk, left eye: Secondary | ICD-10-CM | POA: Diagnosis not present

## 2021-11-30 ENCOUNTER — Other Ambulatory Visit: Payer: Self-pay | Admitting: Cardiovascular Disease

## 2021-11-30 DIAGNOSIS — Z5181 Encounter for therapeutic drug level monitoring: Secondary | ICD-10-CM

## 2021-11-30 DIAGNOSIS — I825Z9 Chronic embolism and thrombosis of unspecified deep veins of unspecified distal lower extremity: Secondary | ICD-10-CM

## 2021-12-02 DIAGNOSIS — I10 Essential (primary) hypertension: Secondary | ICD-10-CM | POA: Diagnosis not present

## 2021-12-02 DIAGNOSIS — I2510 Atherosclerotic heart disease of native coronary artery without angina pectoris: Secondary | ICD-10-CM | POA: Diagnosis not present

## 2021-12-02 DIAGNOSIS — E1165 Type 2 diabetes mellitus with hyperglycemia: Secondary | ICD-10-CM | POA: Diagnosis not present

## 2021-12-05 ENCOUNTER — Other Ambulatory Visit: Payer: Self-pay | Admitting: Hematology and Oncology

## 2021-12-08 ENCOUNTER — Telehealth: Payer: Self-pay | Admitting: Hematology and Oncology

## 2021-12-08 NOTE — Telephone Encounter (Signed)
Scheduled appointment per 6/5 scheduling message. Patient is aware.

## 2021-12-12 DIAGNOSIS — G4733 Obstructive sleep apnea (adult) (pediatric): Secondary | ICD-10-CM | POA: Diagnosis not present

## 2021-12-12 DIAGNOSIS — I10 Essential (primary) hypertension: Secondary | ICD-10-CM | POA: Diagnosis not present

## 2021-12-15 ENCOUNTER — Ambulatory Visit (INDEPENDENT_AMBULATORY_CARE_PROVIDER_SITE_OTHER): Payer: HMO | Admitting: *Deleted

## 2021-12-15 DIAGNOSIS — I82A12 Acute embolism and thrombosis of left axillary vein: Secondary | ICD-10-CM | POA: Diagnosis not present

## 2021-12-15 DIAGNOSIS — Z5181 Encounter for therapeutic drug level monitoring: Secondary | ICD-10-CM | POA: Diagnosis not present

## 2021-12-15 LAB — POCT INR: INR: 2 (ref 2.0–3.0)

## 2021-12-15 NOTE — Patient Instructions (Signed)
Continue warfarin 1 tablet daily.   Recheck INR in 6 weeks.  Call Coumadin clinic for any questions or changes in medications.  

## 2021-12-17 ENCOUNTER — Encounter (HOSPITAL_BASED_OUTPATIENT_CLINIC_OR_DEPARTMENT_OTHER): Payer: Self-pay | Admitting: Cardiovascular Disease

## 2021-12-17 ENCOUNTER — Ambulatory Visit (INDEPENDENT_AMBULATORY_CARE_PROVIDER_SITE_OTHER): Payer: HMO | Admitting: Cardiovascular Disease

## 2021-12-17 VITALS — BP 126/64 | HR 64 | Ht 62.5 in | Wt 166.0 lb

## 2021-12-17 DIAGNOSIS — I825Z9 Chronic embolism and thrombosis of unspecified deep veins of unspecified distal lower extremity: Secondary | ICD-10-CM | POA: Diagnosis not present

## 2021-12-17 DIAGNOSIS — G4733 Obstructive sleep apnea (adult) (pediatric): Secondary | ICD-10-CM | POA: Diagnosis not present

## 2021-12-17 DIAGNOSIS — I5032 Chronic diastolic (congestive) heart failure: Secondary | ICD-10-CM | POA: Diagnosis not present

## 2021-12-17 DIAGNOSIS — R0989 Other specified symptoms and signs involving the circulatory and respiratory systems: Secondary | ICD-10-CM | POA: Diagnosis not present

## 2021-12-17 DIAGNOSIS — I2510 Atherosclerotic heart disease of native coronary artery without angina pectoris: Secondary | ICD-10-CM | POA: Diagnosis not present

## 2021-12-17 DIAGNOSIS — I10 Essential (primary) hypertension: Secondary | ICD-10-CM

## 2021-12-17 HISTORY — DX: Obstructive sleep apnea (adult) (pediatric): G47.33

## 2021-12-17 NOTE — Assessment & Plan Note (Signed)
She remains on chronic warfarin.

## 2021-12-17 NOTE — Assessment & Plan Note (Signed)
Today she is very mildly volume overloaded 2.  She had grade 2 diastolic dysfunction on her echo in 2016.  Blood pressure is well controlled.  She is unable to wear compression socks in the morning and has nobody at home to help her.  Continue with carvedilol, Farxiga, Lasix, hydralazine, and Imdur.  We will have her take an extra dose of her Lasix today and tomorrow.  She was encouraged to keep up the regular exercise.

## 2021-12-17 NOTE — Assessment & Plan Note (Signed)
Moderate blockage in the LAD.  She is walking and has no symptoms.  Lipids are well-controlled on Praluent.  Continue carvedilol, Farxiga and Imdur.  She is not on aspirin given that she is on warfarin.

## 2021-12-17 NOTE — Assessment & Plan Note (Signed)
Continue CPAP.  

## 2021-12-17 NOTE — Assessment & Plan Note (Signed)
Blood pressure is very well controlled.  Continue hydralazine, Imdur, and carvedilol.  Keep up the regular exercise.

## 2021-12-17 NOTE — Progress Notes (Signed)
Cardiology Office Note  Date:  12/17/2021   ID:  [name], DOB 11-Aug-1940, MRN [id number]  PCP:  Sharilyn Sites, MD  Cardiologist:   Skeet Latch, MD   No chief complaint on file.  History of Present Illness: [name] is a 81 y.o. female with CAD (50% LAD), chronic diastolic heart failure (grade 2), hypertension, hyperlipidemia, prior DVT on warfarin, breast cancer s/p lumpectomy and XRT, and diabetes type 2 who presents for follow up.  She was first seen 04/2015 at which time she reported occasional chest pain.  She had an exercise Myoview 04/2015 that showed LVEF 78% with a small defect of moderate severity in the mid anterior and apical anterior region.  This was felt to be due to breast attenuation artifact.  However, she underwent cardiac catheterization on 05/26/15 that revealed a 50% LAD lesion. There was concern that there may be a component of vasospasm so long-acting nitrates were started. Her blood pressure and hyperlipidemia medications have been titrated due to poor control.  She is also on Lasix due to lower extremity edema.  [name]' husband died of COVID double pneumonia on 07/2019.  He was treated with Remdesivir at [facility] outpatient infusion center.  She was experiencing atypical chest pain.  She was referred for [facility] that revealed LVEF 74% with no ischemia.  Since then she called our office with concern for dizziness.  She spoke with our pharmacist and was advised to stop her hydralazine.  However her dizziness did not improve and it was not thought to be due to the medication or her blood pressure.  She had an episode while sitting on the toilet had to grab the garbage can to vomit.  They recommended that she see her PCP.  She had vertigo in the past and has used meclizine.  Her LFTs were elevated so her PCP stopped her pravastatin.  She since started Praluent.   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  She underwent lumpectomy and XRT.  She noted that since her surgery she was feeling exhausted.  She also became tired after taking the anazstrazole and is unable to drive after taking it. She had a sleep study 03/2021 which showed mild sleep apnea. Given underling medical conditions, it was recommended she start an oral airway device or a CPAP.  She was admitted 07/2021 with the flu and sinusitis.  She has struggled with hearing her heart beat in her ear.  Today, she overall appears well, and is scheduled to have eye surgery. For exercise she walks regularly in her son's driveway and on trails. She remains compliant with her cholesterol medications and Zyrtec. She reports her legs swell more when it is hot outside. She experiences sleepiness during the day, and she uses her Cpap machine regularly at night. The patient denies chest pain, shortness of breath, nocturnal dyspnea, or orthopnea.  There have been no palpitations, lightheadedness or syncope.  Complains of sleepiness.   Past Medical History:  Diagnosis Date   Antral gastritis    EGD 11/15   Asthmatic bronchitis    Back pain    Breast cancer ([place])    right breast   CAD in native artery 03/10/2021   Chronic diastolic heart failure ([place]) 05/20/2015   Grade 2 diastolic dysfunction.  04/2015.   Chronic kidney disease    kidney function low   Diabetes mellitus    x 5 yrs   DVT of axillary vein, acute left (  [place]) 07/24/2012   GERD (gastroesophageal reflux disease)    Glaucoma    POAG OU   Heart murmur    History of hiatal hernia    History of kidney stones    Hyperlipidemia 05/20/2015   Hypertension    Hypertensive retinopathy    OU   Hypothyroidism    Kidney stones    Macular degeneration    Wet OD, Dry OS   Mixed hyperlipidemia    OSA (obstructive sleep apnea) 12/17/2021   Peripheral venous insufficiency    Pinched nerve    right elbow   Pneumonia    Sigmoid diverticulitis    Snoring 03/10/2021   Vertigo    chonic    Past Surgical History:  Procedure  Laterality Date   ABDOMINAL HYSTERECTOMY     BACK SURGERY     spinal    BREAST LUMPECTOMY WITH RADIOACTIVE SEED LOCALIZATION Right 12/03/2020   Procedure: RIGHT BREAST LUMPECTOMY WITH RADIOACTIVE SEED LOCALIZATION;  Surgeon: Donnie Mesa, MD;  Location: [facility];  Service: General;  Laterality: Right;   CARDIAC CATHETERIZATION N/A 05/26/2015   Procedure: Left Heart Cath and Coronary Angiography;  Surgeon: Jettie Booze, MD;  Location: [facility];  Service: Cardiovascular;  Laterality: N/A;   CATARACT EXTRACTION Bilateral    CHOLECYSTECTOMY     COLON SURGERY     COLONOSCOPY N/A 12/27/2013   Procedure: COLONOSCOPY;  Surgeon: Rogene Houston, MD;  [facility];  Service: Endoscopy;  Laterality: N/A;  200   COLOSTOMY CLOSURE     ESOPHAGOGASTRODUODENOSCOPY N/A 05/07/2014   Procedure: ESOPHAGOGASTRODUODENOSCOPY (EGD);  Surgeon: Rogene Houston, MD;  [facility];  Service: Endoscopy;  Laterality: N/A;   EYE SURGERY Bilateral    Cat Sx   fracture left foot     HERNIA REPAIR     NM MYOCAR PERF WALL MOTION  01/28/2009   Normal   OTHER SURGICAL HISTORY     colostomy, colostomy reversal, for diverticulitis surgical hernia repair, arm surgery, neck surgery   US ECHOCARDIOGRAPHY  02/11/2010   Mild MR,trace TR & AI     Current Outpatient Medications  Medication Sig Dispense Refill   albuterol (PROVENTIL HFA;VENTOLIN HFA) 108 (90 BASE) MCG/ACT inhaler Inhale 2 puffs into the lungs every 4 (four) hours as needed for shortness of breath. 1 Inhaler 0   Alirocumab (PRALUENT) 75 MG/ML SOAJ Inject 75 mg into the skin every 14 (fourteen) days. 2 mL 11   amitriptyline (ELAVIL) 25 MG tablet Take 25 mg by mouth at bedtime.     anastrozole (ARIMIDEX) 1 MG tablet TAKE 1 TABLET BY MOUTH EVERY DAY 90 tablet 3   Black Cohosh 40 MG CAPS Take 40 mg by mouth 2 (two) times daily.     carvedilol (COREG) 6.25 MG tablet Take 1 tablet (6.25 mg total) by mouth 2 (two) times daily. 180  tablet 1   cetirizine (ZYRTEC) 10 MG tablet Take 10 mg by mouth daily.     Cholecalciferol (VITAMIN D-3) 1000 units CAPS Take 1,000 Units by mouth daily.     diazepam (VALIUM) 2 MG tablet Take 2 mg by mouth 2 (two) times daily as needed for anxiety (dizziness).     dorzolamide-timolol (COSOPT) 22.3-6.8 MG/ML ophthalmic solution INSTILL 1 DROP INTO RIGHT EYE TWICE A DAY (Patient taking differently: Place 1 drop into the right eye 2 (two) times daily.) 30 mL 2   esomeprazole (NEXIUM) 20 MG capsule Take 20 mg by mouth daily at 12  noon.     FARXIGA 5 MG TABS tablet Take 5 mg by mouth every morning.     furosemide (LASIX) 40 MG tablet TAKE 1 TABLET (40 MG TOTAL) BY MOUTH DAILY. MAY TAKE EXTRA DAILY AS NEEDED FOR SWELLING 180 tablet 0   gabapentin (NEURONTIN) 300 MG capsule Take 300 mg by mouth 3 (three) times daily.     glimepiride (AMARYL) 2 MG tablet Take 2 mg by mouth daily.  2   hydrALAZINE (APRESOLINE) 25 MG tablet TAKE ONE TABLET BY MOUTH AT BREAKFAST AND AT BEDTIME 180 tablet 1   isosorbide mononitrate (IMDUR) 30 MG 24 hr tablet TAKE 1 TABLET BY MOUTH  DAILY 90 tablet 2   latanoprost (XALATAN) 0.005 % ophthalmic solution Place 1 drop into both eyes at bedtime.      meclizine (ANTIVERT) 25 MG tablet Take 25 mg by mouth 2 (two) times daily as needed for dizziness.     Multiple Vitamins-Minerals (PRESERVISION AREDS 2 PO) Take 1 capsule by mouth in the morning and at bedtime.     Omega-3 Fatty Acids (FISH OIL) 1200 MG CAPS Take 1,200 mg by mouth 2 (two) times daily.     polyethylene glycol (MIRALAX / GLYCOLAX) packet Take 17 g by mouth daily.     potassium chloride SA (KLOR-CON M) 20 MEQ tablet TAKE ONE-HALF TABLET BY  MOUTH DAILY 45 tablet 2   tetrahydrozoline 0.05 % ophthalmic solution Place 1 drop into both eyes at bedtime.     TRADJENTA 5 MG TABS tablet Take 5 mg by mouth daily.     vitamin B-12 (CYANOCOBALAMIN) 100 MCG tablet Take 100 mcg by mouth daily.     warfarin (COUMADIN) 2 MG tablet  TAKE ONE TABLET BY MOUTH EVERYDAY AT BEDTIME AS DIRECTED by THE coumadin clinic 40 tablet 1   No current facility-administered medications for this visit.    Allergies:   Alphagan [brimonidine], Diflunisal, Vioxx [rofecoxib], Metformin and related, Nexlizet [bempedoic acid-ezetimibe], Pravastatin, Repatha [evolocumab], Codeine, Elemental sulfur, Motrin [ibuprofen], and Penicillins    Social History:  The patient  reports that she quit smoking about 8 years ago. Her smoking use included cigarettes. She has never used smokeless tobacco. She reports that she does not drink alcohol and does not use drugs.   Family History:  The patient's family history includes Bone cancer in her maternal uncle; Breast cancer in her maternal grandmother and sister; CVA (age of onset: 68) in her maternal grandmother; Cancer in her mother; Glaucoma in her maternal uncle; Heart attack (age of onset: 24) in her brother; Heart disease in her maternal grandmother; Leukemia in her maternal aunt; Other (age of onset: 61) in her mother; Spina bifida in her daughter; Stroke in her maternal grandmother.    ROS:  Please see the history of present illness.    (+) Sleepiness/tiredness (+) LE Edema All other systems are reviewed and negative.    PHYSICAL EXAM: VS:  BP 126/64   Pulse 64   Ht 5' 2.5" (1.588 m)   Wt 166 lb (75.3 kg)   BMI 29.88 kg/m  , BMI Body mass index is 29.88 kg/m. GENERAL:  Well appearing HEENT: Pupils equal round and reactive, fundi not visualized, oral mucosa unremarkable NECK:  No jugular venous distention, waveform within normal limits, carotid upstroke brisk and symmetric, R carotid bruit LUNGS:  Clear to auscultation bilaterally HEART:  RRR.  PMI not displaced or sustained,S1 and S2 within normal limits, no S3, no S4, no clicks, no rubs, no  murmurs ABD:  Flat, positive bowel sounds normal in frequency in pitch, no bruits, no rebound, no guarding, no midline pulsatile mass, no hepatomegaly, no  splenomegaly EXT:  2 plus pulses throughout, 1+ tense LE edema, no cyanosis no clubbing SKIN:  No rashes no nodules NEURO:  Cranial nerves II through XII grossly intact, motor grossly intact throughout PSYCH:  Cognitively intact, oriented to person place and time  EKG: EKG is personally reviewed.  03/10/21: Sinus rhythm.  Rate 74 bpm.  RBBB.  LPFB.  04/11/20: Sinus rhythm.  Rate 75 bpm.  RBBB.  09/28/19: Sinus rhythm.  Rate 75 bpm.  RBBB. 09/15/17: Sinus rhythm.  Rate 64 bpm. RBBB  03/02/17: Sinus rhythm.  RBBB.  LPFB 02/16/16: Sinus rhythm rate 83 bpm.  PAC.  RBBB.  LPFB.  Lexiscan Myoview 10/2019: Nuclear stress EF: 74%. The left ventricular ejection fraction is hyperdynamic (>65%). There was no ST segment deviation noted during stress. This is a low risk study. There is no evidence of ischemia or previous infarction. The study is normal.  Exercise Myoview 04/30/15 The left ventricular ejection fraction is hyperdynamic (>65%). Nuclear stress EF: 78%. Blood pressure demonstrated a hypertensive response to exercise. Upsloping ST segment depression ST segment depression was noted during stress in the III, II and aVF leads. This is not diagnostic for ischemia. Defect 1: There is a small defect of moderate severity present in the mid anterior and apical anterior location. Wall motion is normal, so this is likely artifact due to breast attenuation. However, cannot rule out LAD ischemia. This is a low risk study.  Cardiac cath 05/26/15: Mid LAD lesion, 50% stenosed. FFR of this lesion showed value of 0.85. This is felt to be nonsignificant. Normal LVEDP. Continue aggressive medical therapy. Would consider adding long-acting nitrate as the patient may have some component of vasospasm. Of note, in the future , if repeat catheterization was needed , would consider using a 5 [place] guide catheter as her left main is very short and the catheter tends to deep seat  Into either the LAD or the  circumflex.  Recent Labs: 08/20/2021: ALT 16; BUN 21; Creatinine, Ser 1.07; Potassium 4.7; Sodium 144   03/12/15: INR 2.9   Lipid Panel    Component Value Date/Time   CHOL 132 08/20/2021 1120   TRIG 134 08/20/2021 1120   HDL 49 08/20/2021 1120   CHOLHDL 2.7 08/20/2021 1120   CHOLHDL 4.9 08/17/2016 0914   VLDL 33 (H) 08/17/2016 0914   LDLCALC 60 08/20/2021 1120    Wt Readings from Last 3 Encounters:  12/17/21 166 lb (75.3 kg)  09/17/21 168 lb 12.8 oz (76.6 kg)  07/08/21 173 lb 3.2 oz (78.6 kg)     ASSESSMENT AND PLAN:  CAD in native artery Moderate blockage in the LAD.  She is walking and has no symptoms.  Lipids are well-controlled on Praluent.  Continue carvedilol, Farxiga and Imdur.  She is not on aspirin given that she is on warfarin.   Chronic diastolic heart failure ([place]) Today she is very mildly volume overloaded 2.  She had grade 2 diastolic dysfunction on her echo in 2016.  Blood pressure is well controlled.  She is unable to wear compression socks in the morning and has nobody at home to help her.  Continue with carvedilol, Farxiga, Lasix, hydralazine, and Imdur.  We will have her take an extra dose of her Lasix today and tomorrow.  She was encouraged to keep up the regular exercise.  HTN (hypertension)  Blood pressure is very well controlled.  Continue hydralazine, Imdur, and carvedilol.  Keep up the regular exercise.  DVT (deep venous thrombosis) She remains on chronic warfarin.  OSA (obstructive sleep apnea) Continue CPAP.   Current medicines are reviewed at length with the patient today.  The patient does not have concerns regarding medicines.  The following changes have been made:  no change  Labs/ tests ordered today include:   Orders Placed This Encounter  Procedures   VAS US CAROTID     Disposition:   FU with Eutimio Gharibian C. Oval Linsey, MD in 6 months   I,Tinashe Williams,acting as a Education administrator for [place], MD.,have documented all relevant  documentation on the behalf of Skeet Latch, MD,as directed by  Skeet Latch, MD while in the presence of Skeet Latch, MD.   I, [place] Oval Linsey, MD have reviewed all documentation for this visit.  The documentation of the exam, diagnosis, procedures, and orders on 12/17/2021 are all accurate and complete.

## 2021-12-17 NOTE — Patient Instructions (Signed)
Medication Instructions:  TAKE AN EXTRA DOSE OF LASIX (FUROSEMIDE) TODAY AND TOMORROW   *If you need a refill on your cardiac medications before your next appointment, please call your pharmacy*  Lab Work: NONE   Testing/Procedures: Your physician has requested that you have a carotid duplex. This test is an ultrasound of the carotid arteries in your neck. It looks at blood flow through these arteries that supply the brain with blood. Allow one hour for this exam. There are no restrictions or special instructions.  Follow-Up: At [facility], you and your health needs are our priority.  As part of our continuing mission to provide you with exceptional heart care, we have created designated Provider Care Teams.  These Care Teams include your primary Cardiologist (physician) and Advanced Practice Providers (APPs -  Physician Assistants and Nurse Practitioners) who all work together to provide you with the care you need, when you need it.  We recommend signing up for the patient portal called "MyChart".  Sign up information is provided on this After Visit Summary.  MyChart is used to connect with patients for Virtual Visits (Telemedicine).  Patients are able to view lab/test results, encounter notes, upcoming appointments, etc.  Non-urgent messages can be sent to your provider as well.   To learn more about what you can do with MyChart, go to [email or web address].    Your next appointment:   6 month(s)  The format for your next appointment:   In Person  Provider:   Skeet Latch, MD

## 2021-12-23 DIAGNOSIS — Z961 Presence of intraocular lens: Secondary | ICD-10-CM | POA: Diagnosis not present

## 2021-12-23 DIAGNOSIS — H353211 Exudative age-related macular degeneration, right eye, with active choroidal neovascularization: Secondary | ICD-10-CM | POA: Diagnosis not present

## 2021-12-23 DIAGNOSIS — H353122 Nonexudative age-related macular degeneration, left eye, intermediate dry stage: Secondary | ICD-10-CM | POA: Diagnosis not present

## 2021-12-23 DIAGNOSIS — H40022 Open angle with borderline findings, high risk, left eye: Secondary | ICD-10-CM | POA: Diagnosis not present

## 2021-12-23 DIAGNOSIS — H401113 Primary open-angle glaucoma, right eye, severe stage: Secondary | ICD-10-CM | POA: Diagnosis not present

## 2021-12-23 DIAGNOSIS — E119 Type 2 diabetes mellitus without complications: Secondary | ICD-10-CM | POA: Diagnosis not present

## 2021-12-25 DIAGNOSIS — I10 Essential (primary) hypertension: Secondary | ICD-10-CM | POA: Diagnosis not present

## 2021-12-25 DIAGNOSIS — G4733 Obstructive sleep apnea (adult) (pediatric): Secondary | ICD-10-CM | POA: Diagnosis not present

## 2021-12-28 ENCOUNTER — Ambulatory Visit (INDEPENDENT_AMBULATORY_CARE_PROVIDER_SITE_OTHER): Payer: HMO

## 2021-12-28 DIAGNOSIS — R0989 Other specified symptoms and signs involving the circulatory and respiratory systems: Secondary | ICD-10-CM | POA: Diagnosis not present

## 2022-01-01 DIAGNOSIS — E1165 Type 2 diabetes mellitus with hyperglycemia: Secondary | ICD-10-CM | POA: Diagnosis not present

## 2022-01-01 DIAGNOSIS — I10 Essential (primary) hypertension: Secondary | ICD-10-CM | POA: Diagnosis not present

## 2022-01-01 DIAGNOSIS — I2510 Atherosclerotic heart disease of native coronary artery without angina pectoris: Secondary | ICD-10-CM | POA: Diagnosis not present

## 2022-01-07 DIAGNOSIS — H401113 Primary open-angle glaucoma, right eye, severe stage: Secondary | ICD-10-CM | POA: Diagnosis not present

## 2022-01-08 ENCOUNTER — Other Ambulatory Visit: Payer: Self-pay | Admitting: Cardiovascular Disease

## 2022-01-08 NOTE — Telephone Encounter (Signed)
Rx request sent to pharmacy.  

## 2022-01-11 DIAGNOSIS — G4733 Obstructive sleep apnea (adult) (pediatric): Secondary | ICD-10-CM | POA: Diagnosis not present

## 2022-01-11 DIAGNOSIS — I10 Essential (primary) hypertension: Secondary | ICD-10-CM | POA: Diagnosis not present

## 2022-01-12 ENCOUNTER — Telehealth (INDEPENDENT_AMBULATORY_CARE_PROVIDER_SITE_OTHER): Payer: Self-pay

## 2022-01-12 ENCOUNTER — Encounter (INDEPENDENT_AMBULATORY_CARE_PROVIDER_SITE_OTHER): Payer: HMO | Admitting: Ophthalmology

## 2022-01-12 DIAGNOSIS — E119 Type 2 diabetes mellitus without complications: Secondary | ICD-10-CM

## 2022-01-12 DIAGNOSIS — I10 Essential (primary) hypertension: Secondary | ICD-10-CM

## 2022-01-12 DIAGNOSIS — H401130 Primary open-angle glaucoma, bilateral, stage unspecified: Secondary | ICD-10-CM

## 2022-01-12 DIAGNOSIS — H35033 Hypertensive retinopathy, bilateral: Secondary | ICD-10-CM

## 2022-01-12 DIAGNOSIS — H353122 Nonexudative age-related macular degeneration, left eye, intermediate dry stage: Secondary | ICD-10-CM

## 2022-01-12 DIAGNOSIS — H353211 Exudative age-related macular degeneration, right eye, with active choroidal neovascularization: Secondary | ICD-10-CM

## 2022-01-12 DIAGNOSIS — Z961 Presence of intraocular lens: Secondary | ICD-10-CM

## 2022-01-12 DIAGNOSIS — H26491 Other secondary cataract, right eye: Secondary | ICD-10-CM

## 2022-01-12 NOTE — Telephone Encounter (Signed)
Pt called stating she needed to r/s her apt on 01/12/22 with [place] per Dr. Katy Fitch. Pt had zen laser sx OD for glaucoma and developed hyphema in OD. Called Dr. Patrici Ranks office and spoke w/ Larene Beach who confirmed patient's report. Per Larene Beach, gave pt a few weeks for recovery. She is scheduled for f/u w/ Dr. Katy Fitch on 01/13/22. R/S pt to 7/28 to see Dr. Coralyn Pear. MS

## 2022-01-19 ENCOUNTER — Other Ambulatory Visit (HOSPITAL_BASED_OUTPATIENT_CLINIC_OR_DEPARTMENT_OTHER): Payer: Self-pay | Admitting: Cardiovascular Disease

## 2022-01-19 ENCOUNTER — Telehealth (HOSPITAL_BASED_OUTPATIENT_CLINIC_OR_DEPARTMENT_OTHER): Payer: Self-pay | Admitting: *Deleted

## 2022-01-19 DIAGNOSIS — I6523 Occlusion and stenosis of bilateral carotid arteries: Secondary | ICD-10-CM

## 2022-01-19 DIAGNOSIS — I6522 Occlusion and stenosis of left carotid artery: Secondary | ICD-10-CM

## 2022-01-19 NOTE — Telephone Encounter (Signed)
-----   Message from Skeet Latch, MD sent at 12/28/2021  3:21 PM EDT ----- Mild blockage on the right.  The left appears to be more severe.  Attempted to call patient and got the voicemail.  Recommend referral to vascular surgery to see if it needs to be opened.

## 2022-01-19 NOTE — Telephone Encounter (Signed)
Patient never returned calls, called to follow up Advised patient, referral placed

## 2022-01-26 ENCOUNTER — Ambulatory Visit (INDEPENDENT_AMBULATORY_CARE_PROVIDER_SITE_OTHER): Payer: PPO | Admitting: *Deleted

## 2022-01-26 DIAGNOSIS — I82A12 Acute embolism and thrombosis of left axillary vein: Secondary | ICD-10-CM | POA: Diagnosis not present

## 2022-01-26 DIAGNOSIS — Z5181 Encounter for therapeutic drug level monitoring: Secondary | ICD-10-CM | POA: Diagnosis not present

## 2022-01-26 LAB — POCT INR: INR: 2.9 (ref 2.0–3.0)

## 2022-01-26 NOTE — Patient Instructions (Signed)
Continue warfarin 1 tablet daily.   Recheck INR in 6 weeks.  Call Coumadin clinic for any questions or changes in medications.

## 2022-01-28 ENCOUNTER — Other Ambulatory Visit: Payer: Self-pay | Admitting: Cardiovascular Disease

## 2022-01-28 DIAGNOSIS — I825Z9 Chronic embolism and thrombosis of unspecified deep veins of unspecified distal lower extremity: Secondary | ICD-10-CM

## 2022-01-28 DIAGNOSIS — Z5181 Encounter for therapeutic drug level monitoring: Secondary | ICD-10-CM

## 2022-01-28 NOTE — Telephone Encounter (Signed)
Prescription refill request received for warfarin Lov: Oval Linsey, 12/17/2021 Next INR check: 9/5 Warfarin tablet strength: '2mg'$ 

## 2022-01-28 NOTE — Telephone Encounter (Signed)
Please review for refill. Thank you! 

## 2022-01-28 NOTE — Telephone Encounter (Signed)
Prescription refill request received for warfarin Lov: 12/17/21 Next INR check: 03/09/22 Warfarin tablet strength: '2mg'$   Appropriate dose and refill sent to requested pharmacy.

## 2022-01-29 ENCOUNTER — Encounter (INDEPENDENT_AMBULATORY_CARE_PROVIDER_SITE_OTHER): Payer: PPO | Admitting: Ophthalmology

## 2022-01-29 DIAGNOSIS — E119 Type 2 diabetes mellitus without complications: Secondary | ICD-10-CM

## 2022-01-29 DIAGNOSIS — I10 Essential (primary) hypertension: Secondary | ICD-10-CM

## 2022-01-29 DIAGNOSIS — Z961 Presence of intraocular lens: Secondary | ICD-10-CM

## 2022-01-29 DIAGNOSIS — H353211 Exudative age-related macular degeneration, right eye, with active choroidal neovascularization: Secondary | ICD-10-CM

## 2022-01-29 DIAGNOSIS — H26491 Other secondary cataract, right eye: Secondary | ICD-10-CM

## 2022-01-29 DIAGNOSIS — H353122 Nonexudative age-related macular degeneration, left eye, intermediate dry stage: Secondary | ICD-10-CM

## 2022-01-29 DIAGNOSIS — H35033 Hypertensive retinopathy, bilateral: Secondary | ICD-10-CM

## 2022-01-29 DIAGNOSIS — H401130 Primary open-angle glaucoma, bilateral, stage unspecified: Secondary | ICD-10-CM

## 2022-02-01 DIAGNOSIS — I10 Essential (primary) hypertension: Secondary | ICD-10-CM | POA: Diagnosis not present

## 2022-02-01 DIAGNOSIS — E1165 Type 2 diabetes mellitus with hyperglycemia: Secondary | ICD-10-CM | POA: Diagnosis not present

## 2022-02-01 DIAGNOSIS — I2510 Atherosclerotic heart disease of native coronary artery without angina pectoris: Secondary | ICD-10-CM | POA: Diagnosis not present

## 2022-02-02 ENCOUNTER — Encounter (INDEPENDENT_AMBULATORY_CARE_PROVIDER_SITE_OTHER): Payer: PPO | Admitting: Ophthalmology

## 2022-02-03 ENCOUNTER — Ambulatory Visit (INDEPENDENT_AMBULATORY_CARE_PROVIDER_SITE_OTHER): Payer: PPO | Admitting: Vascular Surgery

## 2022-02-03 ENCOUNTER — Encounter: Payer: Self-pay | Admitting: Vascular Surgery

## 2022-02-03 VITALS — BP 160/83 | HR 70 | Temp 98.2°F | Resp 14 | Ht 60.25 in | Wt 166.4 lb

## 2022-02-03 DIAGNOSIS — I6522 Occlusion and stenosis of left carotid artery: Secondary | ICD-10-CM | POA: Diagnosis not present

## 2022-02-03 NOTE — Progress Notes (Signed)
[facility] [place]  Patient name: [name] MRN: [id number] DOB: 1940/09/13 Sex: female  REASON FOR CONSULT: Evaluation severe left carotid stenosis  HPI: [name] is a 81 y.o. female, who is here today for evaluation of asymptomatic carotid disease.  She underwent evaluation for carotid bruit and underwent duplex at [place].  This revealed critical stenosis of her left internal carotid artery.  She specifically denies any prior history of amaurosis fugax, aphasia, TIA or stroke.  She is right-handed.  She does have a remote history of DVT.  She has been on chronic Coumadin therapy for over 10 years.  Past Medical History:  Diagnosis Date   Antral gastritis    EGD 11/15   Asthmatic bronchitis    Back pain    Breast cancer ([place])    right breast   CAD in native artery 03/10/2021   Chronic diastolic heart failure ([place]) 05/20/2015   Grade 2 diastolic dysfunction.  04/2015.   Chronic kidney disease    kidney function low   Diabetes mellitus    x 5 yrs   DVT of axillary vein, acute left (HCC) 07/24/2012   GERD (gastroesophageal reflux disease)    Glaucoma    POAG OU   Heart murmur    History of hiatal hernia    History of kidney stones    Hyperlipidemia 05/20/2015   Hypertension    Hypertensive retinopathy    OU   Hypothyroidism    Kidney stones    Macular degeneration    Wet OD, Dry OS   Mixed hyperlipidemia    OSA (obstructive sleep apnea) 12/17/2021   Peripheral venous insufficiency    Pinched nerve    right elbow   Pneumonia    Sigmoid diverticulitis    Snoring 03/10/2021   Vertigo    chonic    Family History  Problem Relation Age of Onset   CVA Maternal Grandmother 90       deceased   Heart disease Maternal Grandmother    Stroke Maternal Grandmother    Breast cancer Maternal Grandmother    Other Mother 67       Cause unknown   Cancer Mother        liver   Heart attack  Brother 35       deceased   Breast cancer Sister    Leukemia Maternal Aunt    Glaucoma Maternal Uncle    Bone cancer Maternal Uncle    Spina bifida Daughter     SOCIAL HISTORY: Social History   Socioeconomic History   Marital status: Widowed    Spouse name: Not on file   Number of children: Not on file   Years of education: Not on file   Highest education level: Not on file  Occupational History   Not on file  Tobacco Use   Smoking status: Former    Types: Cigarettes    Quit date: 12/10/2013    Years since quitting: 8.1    Passive exposure: Never   Smokeless tobacco: Never   Tobacco comments:    Smoke 1-1 1/2 packs a day  Vaping Use   Vaping Use: Never used  Substance and Sexual Activity   Alcohol use: No   Drug use: No   Sexual activity: Not Currently    Birth control/protection: Surgical    Comment: partial hysterectomy  Other Topics Concern   Not on file  Social History Narrative   Not on  file   Social Determinants of Health   Financial Resource Strain: Low Risk  (09/17/2021)   Overall Financial Resource Strain (CARDIA)    Difficulty of Paying Living Expenses: Not very hard  Food Insecurity: No Food Insecurity (09/17/2021)   Hunger Vital Sign    Worried About Running Out of Food in the Last Year: Never true    Ran Out of Food in the Last Year: Never true  Transportation Needs: No Transportation Needs (09/17/2021)   PRAPARE - Hydrologist (Medical): No    Lack of Transportation (Non-Medical): No  Physical Activity: Inactive (09/17/2021)   Exercise Vital Sign    Days of Exercise per Week: 0 days    Minutes of Exercise per Session: 0 min  Stress: No Stress Concern Present (09/17/2021)   [place]    Feeling of Stress : Not at all  Social Connections: Moderately Integrated (09/17/2021)   Social Connection and Isolation Panel [NHANES]    Frequency of Communication with  Friends and Family: More than three times a week    Frequency of Social Gatherings with Friends and Family: Three times a week    Attends Religious Services: More than 4 times per year    Active Member of Clubs or Organizations: Yes    Attends Archivist Meetings: More than 4 times per year    Marital Status: Widowed  Intimate Partner Violence: Not At Risk (09/17/2021)   Humiliation, Afraid, Rape, and Kick questionnaire    Fear of Current or Ex-Partner: No    Emotionally Abused: No    Physically Abused: No    Sexually Abused: No    Allergies  Allergen Reactions   Alphagan [Brimonidine] Itching   Diflunisal Swelling    Other reaction(s): ENTIRE BODY SWELLING   Vioxx [Rofecoxib] Shortness Of Breath   Metformin And Related     Kidney failure   Nexlizet [Bempedoic Acid-Ezetimibe]     Causes elevated Liver and Kidney function   Pravastatin    Repatha [Evolocumab]     MYALGIAS   Codeine Rash   Elemental Sulfur Rash   Motrin [Ibuprofen] Rash   Penicillins Rash    Current Outpatient Medications  Medication Sig Dispense Refill   albuterol (PROVENTIL HFA;VENTOLIN HFA) 108 (90 BASE) MCG/ACT inhaler Inhale 2 puffs into the lungs every 4 (four) hours as needed for shortness of breath. 1 Inhaler 0   Alirocumab (PRALUENT) 75 MG/ML SOAJ Inject 75 mg into the skin every 14 (fourteen) days. 2 mL 11   amitriptyline (ELAVIL) 25 MG tablet Take 25 mg by mouth at bedtime.     anastrozole (ARIMIDEX) 1 MG tablet TAKE 1 TABLET BY MOUTH EVERY DAY 90 tablet 3   Black Cohosh 40 MG CAPS Take 40 mg by mouth 2 (two) times daily.     carvedilol (COREG) 6.25 MG tablet TAKE ONE TABLET BY MOUTH TWICE DAILY 180 tablet 1   cetirizine (ZYRTEC) 10 MG tablet Take 10 mg by mouth daily.     Cholecalciferol (VITAMIN D-3) 1000 units CAPS Take 1,000 Units by mouth daily.     diazepam (VALIUM) 2 MG tablet Take 2 mg by mouth 2 (two) times daily as needed for anxiety (dizziness).     dorzolamide-timolol (COSOPT)  22.3-6.8 MG/ML ophthalmic solution INSTILL 1 DROP INTO RIGHT EYE TWICE A DAY (Patient taking differently: Place 1 drop into the right eye 2 (two) times daily.) 30 mL 2   esomeprazole (  NEXIUM) 20 MG capsule Take 20 mg by mouth daily at 12 noon.     FARXIGA 5 MG TABS tablet Take 5 mg by mouth every morning.     furosemide (LASIX) 40 MG tablet TAKE 1 TABLET (40 MG TOTAL) BY MOUTH DAILY. MAY TAKE EXTRA DAILY AS NEEDED FOR SWELLING 180 tablet 0   gabapentin (NEURONTIN) 300 MG capsule Take 300 mg by mouth 3 (three) times daily.     glimepiride (AMARYL) 2 MG tablet Take 2 mg by mouth daily.  2   hydrALAZINE (APRESOLINE) 25 MG tablet TAKE ONE TABLET BY MOUTH AT BREAKFAST AND AT BEDTIME 180 tablet 1   isosorbide mononitrate (IMDUR) 30 MG 24 hr tablet TAKE 1 TABLET BY MOUTH  DAILY 90 tablet 2   latanoprost (XALATAN) 0.005 % ophthalmic solution Place 1 drop into both eyes at bedtime.      meclizine (ANTIVERT) 25 MG tablet Take 25 mg by mouth 2 (two) times daily as needed for dizziness.     Multiple Vitamins-Minerals (PRESERVISION AREDS 2 PO) Take 1 capsule by mouth in the morning and at bedtime.     Omega-3 Fatty Acids (FISH OIL) 1200 MG CAPS Take 1,200 mg by mouth 2 (two) times daily.     polyethylene glycol (MIRALAX / GLYCOLAX) packet Take 17 g by mouth daily.     potassium chloride SA (KLOR-CON M) 20 MEQ tablet TAKE ONE-HALF TABLET BY  MOUTH DAILY 45 tablet 2   prednisoLONE acetate (PRED FORTE) 1 % ophthalmic suspension Place 1 drop into the right eye 4 (four) times daily.     tetrahydrozoline 0.05 % ophthalmic solution Place 1 drop into both eyes at bedtime.     TRADJENTA 5 MG TABS tablet Take 5 mg by mouth daily.     vitamin B-12 (CYANOCOBALAMIN) 100 MCG tablet Take 100 mcg by mouth daily.     warfarin (COUMADIN) 2 MG tablet TAKE ONE TABLET BY MOUTH EVERYDAY AT BEDTIME AS DIRECTED by THE coumadin Clinic 40 tablet 2   No current facility-administered medications for this visit.    REVIEW OF SYSTEMS:   '[X]'$  denotes positive finding, '[ ]'$  denotes negative finding Cardiac  Comments:  Chest pain or chest pressure:    Shortness of breath upon exertion:    Short of breath when lying flat:    Irregular heart rhythm:        Vascular    Pain in calf, thigh, or hip brought on by ambulation:    Pain in feet at night that wakes you up from your sleep:     Blood clot in your veins:    Leg swelling:         Pulmonary    Oxygen at home:    Productive cough:     Wheezing:         Neurologic    Sudden weakness in arms or legs:     Sudden numbness in arms or legs:     Sudden onset of difficulty speaking or slurred speech:    Temporary loss of vision in one eye:     Problems with dizziness:         Gastrointestinal    Blood in stool:     Vomited blood:         Genitourinary    Burning when urinating:     Blood in urine:        Psychiatric    Major depression:         Hematologic  Bleeding problems:    Problems with blood clotting too easily:        Skin    Rashes or ulcers:        Constitutional    Fever or chills:      PHYSICAL EXAM: Vitals:   02/03/22 1109 02/03/22 1110  BP: (!) 174/78 (!) 160/83  Pulse: 70   Resp: 14   Temp: 98.2 F (36.8 C)   TempSrc: Temporal   SpO2: 95%   Weight: 166 lb 6.4 oz (75.5 kg)   Height: 5' 0.25" (1.53 m)     GENERAL: The patient is a well-nourished female, in no acute distress. The vital signs are documented above. CARDIOVASCULAR: Harsh carotid bruit on the left.  Soft bruit on the right.  2+ radial pulses bilaterally PULMONARY: There is good air exchange  MUSCULOSKELETAL: There are no major deformities or cyanosis. NEUROLOGIC: No focal weakness or paresthesias are detected. SKIN: There are no ulcers or rashes noted. PSYCHIATRIC: The patient has a normal affect.  DATA:  I reviewed her duplex with the patient.  This does show critical greater than 80% stenosis in her left internal carotid artery.  She also has significant common  carotid artery stenoses bilaterally  MEDICAL ISSUES: Severe asymptomatic left carotid stenosis with common carotid disease as well.  I did explain that if this is correct this does put her at increased risk of stroke in her dominant hemisphere.  I have recommended CT angiogram for further evaluation.  I discussed surgical treatment if indeed this is confirmed.  I explained that this would be done in [facility] with one of my partners.  We will see her back in the [place] office following the CT angiogram for further discussion.  He knows to report immediately should she develop any focal neurologic deficits   Rosetta Posner, MD [facility] Tel [phone] Pager [phone]  Note: Portions of this report may have been transcribed using voice recognition software.  Every effort has been made to ensure accuracy; however, inadvertent computerized transcription errors may still be present.

## 2022-02-04 ENCOUNTER — Other Ambulatory Visit: Payer: Self-pay

## 2022-02-04 DIAGNOSIS — I6522 Occlusion and stenosis of left carotid artery: Secondary | ICD-10-CM

## 2022-02-08 ENCOUNTER — Other Ambulatory Visit: Payer: Self-pay | Admitting: Cardiovascular Disease

## 2022-02-08 NOTE — Telephone Encounter (Signed)
Rx(s) sent to pharmacy electronically.  

## 2022-02-09 NOTE — Progress Notes (Signed)
[facility] Note  02/16/2022     CHIEF COMPLAINT Patient presents for Retina Follow Up  HISTORY OF PRESENT ILLNESS: [name] is a 81 y.o. female who presents to the clinic today for:  HPI     Retina Follow Up   Patient presents with  Wet AMD.  In right eye.  Severity is moderate.  Duration of 4 months.  Since onset it is stable.  I, the attending physician,  performed the HPI with the patient and updated documentation appropriately.        Comments   Pt here for 4 mo ret f/u exu ARMD OD. F/u delayed due to elevated IOP. Pt went and had tube placement in OD for pressure. She had to cancel this f/u twice due to having bleeding from sx in OD but per Dr. Katy Fitch the bleeding has stopped. Pt states VA is about the same. Pt on pred forte taper dose, TID currently. Decreases weekly.       Last edited by Bernarda Caffey, MD on 02/16/2022 12:37 PM.    Pt feels like her vision is still blurry, she fell and hit her head on her cabinet a few days ago, pt is on latanoprost QHS, pt is on PF taper OD s/p tube placement with Dr. Katy Fitch  Referring physician: Sharilyn Sites, MD [address] [place],  [place] [id number]  HISTORICAL INFORMATION:  Selected notes from the [facility] Referred by Dr. Madelin Headings for concern of SRF OD LEE: 11.27.20 (M. Cotter) [BCVA: OD: 20/80-- OS: 20/60-]  Ocular Hx-glaucoma (latanoprost)  PMH-DM    CURRENT MEDICATIONS: Current Outpatient Medications (Ophthalmic Drugs)  Medication Sig   dorzolamide-timolol (COSOPT) 22.3-6.8 MG/ML ophthalmic solution INSTILL 1 DROP INTO RIGHT EYE TWICE A DAY (Patient taking differently: Place 1 drop into the right eye 2 (two) times daily.)   latanoprost (XALATAN) 0.005 % ophthalmic solution Place 1 drop into both eyes at bedtime.    prednisoLONE acetate (PRED FORTE) 1 % ophthalmic suspension Place 1 drop into the right eye 4 (four) times daily.   tetrahydrozoline 0.05 % ophthalmic  solution Place 1 drop into both eyes at bedtime.   No current facility-administered medications for this visit. (Ophthalmic Drugs)   Current Outpatient Medications (Other)  Medication Sig   albuterol (PROVENTIL HFA;VENTOLIN HFA) 108 (90 BASE) MCG/ACT inhaler Inhale 2 puffs into the lungs every 4 (four) hours as needed for shortness of breath.   Alirocumab (PRALUENT) 75 MG/ML SOAJ Inject 75 mg into the skin every 14 (fourteen) days.   amitriptyline (ELAVIL) 25 MG tablet Take 25 mg by mouth at bedtime.   anastrozole (ARIMIDEX) 1 MG tablet TAKE 1 TABLET BY MOUTH EVERY DAY   Black Cohosh 40 MG CAPS Take 40 mg by mouth 2 (two) times daily.   carvedilol (COREG) 6.25 MG tablet TAKE ONE TABLET BY MOUTH TWICE DAILY   cetirizine (ZYRTEC) 10 MG tablet Take 10 mg by mouth daily.   Cholecalciferol (VITAMIN D-3) 1000 units CAPS Take 1,000 Units by mouth daily.   diazepam (VALIUM) 2 MG tablet Take 2 mg by mouth 2 (two) times daily as needed for anxiety (dizziness).   esomeprazole (NEXIUM) 20 MG capsule Take 20 mg by mouth daily at 12 noon.   FARXIGA 5 MG TABS tablet Take 5 mg by mouth every morning.   furosemide (LASIX) 40 MG tablet TAKE 1 TABLET (40 MG TOTAL) BY MOUTH DAILY. MAY TAKE EXTRA DAILY AS NEEDED FOR SWELLING  gabapentin (NEURONTIN) 300 MG capsule Take 300 mg by mouth 3 (three) times daily.   glimepiride (AMARYL) 2 MG tablet Take 2 mg by mouth daily.   hydrALAZINE (APRESOLINE) 25 MG tablet TAKE ONE TABLET BY MOUTH AT BREAKFAST AND AT BEDTIME   isosorbide mononitrate (IMDUR) 30 MG 24 hr tablet TAKE ONE TABLET BY MOUTH ONCE DAILY   meclizine (ANTIVERT) 25 MG tablet Take 25 mg by mouth 2 (two) times daily as needed for dizziness.   Multiple Vitamins-Minerals (PRESERVISION AREDS 2 PO) Take 1 capsule by mouth in the morning and at bedtime.   Omega-3 Fatty Acids (FISH OIL) 1200 MG CAPS Take 1,200 mg by mouth 2 (two) times daily.   polyethylene glycol (MIRALAX / GLYCOLAX) packet Take 17 g by mouth  daily.   potassium chloride SA (KLOR-CON M) 20 MEQ tablet TAKE ONE-HALF TABLET BY  MOUTH DAILY   TRADJENTA 5 MG TABS tablet Take 5 mg by mouth daily.   vitamin B-12 (CYANOCOBALAMIN) 100 MCG tablet Take 100 mcg by mouth daily.   warfarin (COUMADIN) 2 MG tablet TAKE ONE TABLET BY MOUTH EVERYDAY AT BEDTIME AS DIRECTED by THE coumadin Clinic   No current facility-administered medications for this visit. (Other)   REVIEW OF SYSTEMS: ROS   Positive for: Genitourinary, Endocrine, Cardiovascular, Eyes Negative for: Constitutional, Gastrointestinal, Neurological, Skin, Musculoskeletal, HENT, Respiratory, Psychiatric, Allergic/Imm, Heme/Lymph Last edited by Thompson Grayer, COT on 02/16/2022  8:52 AM.     ALLERGIES Allergies  Allergen Reactions   Alphagan [Brimonidine] Itching   Diflunisal Swelling    Other reaction(s): ENTIRE BODY SWELLING   Vioxx [Rofecoxib] Shortness Of Breath   Metformin And Related     Kidney failure   Nexlizet [Bempedoic Acid-Ezetimibe]     Causes elevated Liver and Kidney function   Pravastatin    Repatha [Evolocumab]     MYALGIAS   Codeine Rash   Elemental Sulfur Rash   Motrin [Ibuprofen] Rash   Penicillins Rash   PAST MEDICAL HISTORY Past Medical History:  Diagnosis Date   Antral gastritis    EGD 11/15   Asthmatic bronchitis    Back pain    Breast cancer (HCC)    right breast   CAD in native artery 03/10/2021   Chronic diastolic heart failure (HCC) 05/20/2015   Grade 2 diastolic dysfunction.  04/2015.   Chronic kidney disease    kidney function low   Diabetes mellitus    x 5 yrs   DVT of axillary vein, acute left (HCC) 07/24/2012   GERD (gastroesophageal reflux disease)    Glaucoma    POAG OU   Heart murmur    History of hiatal hernia    History of kidney stones    Hyperlipidemia 05/20/2015   Hypertension    Hypertensive retinopathy    OU   Hypothyroidism    Kidney stones    Macular degeneration    Wet OD, Dry OS   Mixed  hyperlipidemia    OSA (obstructive sleep apnea) 12/17/2021   Peripheral venous insufficiency    Pinched nerve    right elbow   Pneumonia    Sigmoid diverticulitis    Snoring 03/10/2021   Vertigo    chonic   Past Surgical History:  Procedure Laterality Date   ABDOMINAL HYSTERECTOMY     BACK SURGERY     spinal    BREAST LUMPECTOMY WITH RADIOACTIVE SEED LOCALIZATION Right 12/03/2020   Procedure: RIGHT BREAST LUMPECTOMY WITH RADIOACTIVE SEED LOCALIZATION;  Surgeon: Manus Rudd, MD;  Location: [facility];  Service: General;  Laterality: Right;   CARDIAC CATHETERIZATION N/A 05/26/2015   Procedure: Left Heart Cath and Coronary Angiography;  Surgeon: Jettie Booze, MD;  Location: [facility];  Service: Cardiovascular;  Laterality: N/A;   CATARACT EXTRACTION Bilateral    CHOLECYSTECTOMY     COLON SURGERY     COLONOSCOPY N/A 12/27/2013   Procedure: COLONOSCOPY;  Surgeon: Rogene Houston, MD;  [facility];  Service: Endoscopy;  Laterality: N/A;  200   COLOSTOMY CLOSURE     ESOPHAGOGASTRODUODENOSCOPY N/A 05/07/2014   Procedure: ESOPHAGOGASTRODUODENOSCOPY (EGD);  Surgeon: Rogene Houston, MD;  [facility];  Service: Endoscopy;  Laterality: N/A;   EYE SURGERY Bilateral    Cat Sx   fracture left foot     HERNIA REPAIR     NM MYOCAR PERF WALL MOTION  01/28/2009   Normal   OTHER SURGICAL HISTORY     colostomy, colostomy reversal, for diverticulitis surgical hernia repair, arm surgery, neck surgery   US ECHOCARDIOGRAPHY  02/11/2010   Mild MR,trace TR & AI   FAMILY HISTORY Family History  Problem Relation Age of Onset   CVA Maternal Grandmother 90       deceased   Heart disease Maternal Grandmother    Stroke Maternal Grandmother    Breast cancer Maternal Grandmother    Other Mother 54       Cause unknown   Cancer Mother        liver   Heart attack Brother 41       deceased   Breast cancer Sister    Leukemia Maternal Aunt    Glaucoma Maternal Uncle     Bone cancer Maternal Uncle    Spina bifida Daughter    SOCIAL HISTORY Social History   Tobacco Use   Smoking status: Former    Types: Cigarettes    Quit date: 12/10/2013    Years since quitting: 8.1    Passive exposure: Never   Smokeless tobacco: Never   Tobacco comments:    Smoke 1-1 1/2 packs a day  Vaping Use   Vaping Use: Never used  Substance Use Topics   Alcohol use: No   Drug use: No       OPHTHALMIC EXAM: Base Eye Exam     Visual Acuity (Snellen - Linear)       Right Left   Dist [place] 20/80 +1 20/30 -2   Dist ph [place] 20/40 -1 20/25 -2         Tonometry (Tonopen, 9:06 AM)       Right Left   Pressure 10 15         Pupils       Dark Light Shape React APD   Right 2 1 Round Brisk None   Left 2 1 Round Brisk None         Visual Fields (Counting fingers)       Left Right    Full Full         Extraocular Movement       Right Left    Full, Ortho Full, Ortho         Neuro/Psych     Oriented x3: Yes   Mood/Affect: Normal         Dilation     Both eyes: 1.0% Mydriacyl, 2.5% Phenylephrine @ 9:06 AM           Slit Lamp and Fundus Exam  Slit Lamp Exam       Right Left   Lids/Lashes Dermatochalasis - upper lid, mild Meibomian gland dysfunction Dermatochalasis - upper lid, Telangiectasia, mild Meibomian gland dysfunction   Conjunctiva/Sclera White and quiet, superior bleb White and quiet   Cornea 1+Punctate epethelial erosions, well healed cataract wound 1+ fine Punctate epithelial erosions, arcus   Anterior Chamber narrow temporal angle, tube at 1200 Deep and quiet, narrow temporal angle   Iris Round and moderately dilated Round and moderately dilated to 5.79mm   Lens Posterior chamber intraocular lens, open PC Posterior chamber intraocular lens   Anterior Vitreous Vitreous syneresis, Posterior vitreous detachment Vitreous syneresis         Fundus Exam       Right Left   Disc Sharp rim, +Pallor, +cupping w/ superior and  inferior rim thinning, temporal Peripapillary atrophy mild Pallor, Sharp rim, temporal Peripapillary atrophy   C/D Ratio 0.9 0.5   Macula Flat, Blunted foveal reflex, +focal CNV/PED nasal macula with partial pigment ring, mild interval increase in IRF/ SRF, Drusen, RPE mottling and clumping, no heme Flat, Blunted foveal reflex, fine drusen, mild ERM, Retinal pigment epithelial mottling and clumping, No heme or edema   Vessels attenuated, Tortuous attenuated, Tortuous   Periphery Attached, mild reticular degeneration, No heme, No RT/RD Attached; no heme           IMAGING AND PROCEDURES  Imaging and Procedures for @TODAY @  OCT, Retina - OU - Both Eyes       Right Eye Quality was good. Central Foveal Thickness: 191. Progression has worsened. Findings include normal foveal contour, retinal drusen , subretinal hyper-reflective material, intraretinal hyper-reflective material, intraretinal fluid, pigment epithelial detachment, subretinal fluid, outer retinal atrophy (Mild interval increase in IRF/SRF overlying nasal PED / CNV; partial PVD).   Left Eye Quality was good. Central Foveal Thickness: 205. Progression has been stable. Findings include normal foveal contour, no IRF, no SRF, retinal drusen (Partial PVD, patchy ORA).   Notes *Images captured and stored on drive  Diagnosis / Impression:  OD: exudative ARMD; Mild interval increase in IRF/SRF overlying nasal PED / CNV; partial PVD OS: NFP, no IRF/SRF; +drusen -- nonexudative ARMD  Clinical management:  See below  Abbreviations: NFP - Normal foveal profile. CME - cystoid macular edema. PED - pigment epithelial detachment. IRF - intraretinal fluid. SRF - subretinal fluid. EZ - ellipsoid zone. ERM - epiretinal membrane. ORA - outer retinal atrophy. ORT - outer retinal tubulation. SRHM - subretinal hyper-reflective material      Intravitreal Injection, Pharmacologic Agent - OD - Right Eye       Time Out 02/16/2022. 9:21 AM.  Confirmed correct patient, procedure, site, and patient consented.   Anesthesia Topical anesthesia was used. Anesthetic medications included Lidocaine 2%, Proparacaine 0.5%.   Procedure Preparation included 5% betadine to ocular surface, eyelid speculum. A supplied needle was used.   Injection: 1.25 mg Bevacizumab 1.25mg /0.51ml   Route: Intravitreal, Site: Right Eye   NDC: 02585-277-82, Lot: [id number] , Expiration date: 04/06/2022   Post-op Post injection exam found visual acuity of at least counting fingers. The patient tolerated the procedure well. There were no complications. The patient received written and verbal post procedure care education. Post injection medications were not given.            ASSESSMENT/PLAN:   ICD-10-CM   1. Exudative age-related macular degeneration of right eye with active choroidal neovascularization (HCC)  H35.3211 OCT, Retina - OU - Both Eyes    Intravitreal Injection,  Pharmacologic Agent - OD - Right Eye    Bevacizumab (AVASTIN) SOLN 1.25 mg    2. Intermediate stage nonexudative age-related macular degeneration of left eye  H35.3122     3. Diabetes mellitus type 2 without retinopathy ([place])  E11.9     4. Essential hypertension  I10     5. Hypertensive retinopathy of both eyes  H35.033     6. Pseudophakia of both eyes  Z96.1     7. PCO (posterior capsular opacification), right  H26.491     8. Primary open angle glaucoma of both eyes, unspecified glaucoma stage  H40.1130      1. 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, OD  - h/o delayed f/u from 8 wks to 12 on 6.20.22 due to lumpectomy -- dx'd w/ DCIS  - h/o delayed follow up from 4 weeks to 8 weeks due to passing of husband (12.11.20-02.12.21)   - s/p IVA OD #1 (12.11.20), #2 (02.12.21), #3 (03.12.21), #4 (04.09.21), #5 (05.14.21), #6 (06.17.21), #7 (07.23.21), #8 (10.15.21), #9 (12.7.21), #10 (03.22.22), #11 (06.20.22), #12 (08.24.22), #13 (10.31.22), #14 (01.03.23)  - FA 12.11.20  confirms +CNVM  **history of increased fluid at 7+ months noted on 08.15.23**  - OCT today shows mild interval increase in IRF/SRF overlying nasal PED / CNV; partial PVD at 7+ mos since last IVA  - BCVA OD stable at 20/40   - recommend IVA OD #15 today, 08.15.23  - pt wishes to proceed with injection  - RBA of procedure discussed, questions answered - informed consent obtained and signed - see procedure note - Avastin informed consent form re-signed and scanned on 08.15.2023 (OD)             - Continue to use AT OD  - f/u 3 months -- DFE/OCT/possible injection  2. Age related macular degeneration, non-exudative, OS  - intermediate stage  - The incidence, anatomy, and pathology of dry AMD, risk of progression, and the AREDS and AREDS 2 study including smoking risks discussed with patient.   - recommend Amsler grid monitoring  3. Diabetes mellitus, type 2 without retinopathy  - The incidence, risk factors for progression, natural history and treatment options for diabetic retinopathy  were discussed with patient.    - The need for close monitoring of blood glucose, blood pressure, and serum lipids, avoiding cigarette or any type of tobacco, and the need for long term follow up was also discussed with patient.  - monitor   4,5. Hypertensive retinopathy OU  - discussed importance of tight BP control  - monitor   6,7. Pseudophakia OU, PCO OD  - s/p CE/IOL OU (Dr. Venetia Maxon)  - IOL in good position  - s/p yag cap OD (04.06.22) -- good PC opening  - monitor   8. POAG OU  - formerly managed by Dr. Venetia Maxon -- now following at [facility]  - s/p laser w/ Dr. Venetia Maxon -- ?SLT  - s/p trab OD w/ Dr. Shirleen Schirmer  - IOP 10,15 today  - currently on latanoprost QHS and PF taper (post trab)  Ophthalmic Meds Ordered this visit:  Meds ordered this encounter  Medications   Bevacizumab (AVASTIN) SOLN 1.25 mg     Return in about 3 months (around 05/19/2022) for f/u exu ARMD OD, DFE,  OCT.  There are no Patient Instructions on file for this visit.   This document serves as a record of services personally performed by Gardiner Sleeper, MD, PhD. It was created on their behalf  by Renaldo Reel, COT an ophthalmic technician. The creation of this record is the provider's dictation and/or activities during the visit.    Electronically signed by:  Renaldo Reel, COT  02/09/22 12:37 PM   Gardiner Sleeper, M.D., Ph.D. Diseases & Surgery of the Retina and Vitreous [facility]  I have reviewed the above documentation for accuracy and completeness, and I agree with the above. Gardiner Sleeper, M.D., Ph.D. 02/16/22 12:41 PM   Abbreviations: M myopia (nearsighted); A astigmatism; H hyperopia (farsighted); P presbyopia; Mrx spectacle prescription;  CTL contact lenses; OD right eye; OS left eye; OU both eyes  XT exotropia; ET esotropia; PEK punctate epithelial keratitis; PEE punctate epithelial erosions; DES dry eye syndrome; MGD meibomian gland dysfunction; ATs artificial tears; PFAT's preservative free artificial tears; [place] nuclear sclerotic cataract; PSC posterior subcapsular cataract; ERM epi-retinal membrane; PVD posterior vitreous detachment; RD retinal detachment; DM diabetes mellitus; DR diabetic retinopathy; NPDR non-proliferative diabetic retinopathy; PDR proliferative diabetic retinopathy; CSME clinically significant macular edema; DME diabetic macular edema; dbh dot blot hemorrhages; CWS cotton wool spot; POAG primary open angle glaucoma; C/D cup-to-disc ratio; HVF humphrey visual field; GVF goldmann visual field; OCT optical coherence tomography; IOP intraocular pressure; BRVO Branch retinal vein occlusion; CRVO central retinal vein occlusion; CRAO central retinal artery occlusion; BRAO branch retinal artery occlusion; RT retinal tear; SB scleral buckle; PPV pars plana vitrectomy; VH Vitreous hemorrhage; PRP panretinal laser photocoagulation; IVK  intravitreal kenalog; VMT vitreomacular traction; MH Macular hole;  NVD neovascularization of the disc; NVE neovascularization elsewhere; AREDS age related eye disease study; ARMD age related macular degeneration; POAG primary open angle glaucoma; EBMD epithelial/anterior basement membrane dystrophy; ACIOL anterior chamber intraocular lens; IOL intraocular lens; PCIOL posterior chamber intraocular lens; Phaco/IOL phacoemulsification with intraocular lens placement; [place] photorefractive keratectomy; LASIK laser assisted in situ keratomileusis; HTN hypertension; DM diabetes mellitus; COPD chronic obstructive pulmonary disease

## 2022-02-11 DIAGNOSIS — I10 Essential (primary) hypertension: Secondary | ICD-10-CM | POA: Diagnosis not present

## 2022-02-11 DIAGNOSIS — G4733 Obstructive sleep apnea (adult) (pediatric): Secondary | ICD-10-CM | POA: Diagnosis not present

## 2022-02-16 ENCOUNTER — Encounter (INDEPENDENT_AMBULATORY_CARE_PROVIDER_SITE_OTHER): Payer: Self-pay | Admitting: Ophthalmology

## 2022-02-16 ENCOUNTER — Ambulatory Visit (INDEPENDENT_AMBULATORY_CARE_PROVIDER_SITE_OTHER): Payer: HMO | Admitting: Ophthalmology

## 2022-02-16 DIAGNOSIS — H401130 Primary open-angle glaucoma, bilateral, stage unspecified: Secondary | ICD-10-CM | POA: Diagnosis not present

## 2022-02-16 DIAGNOSIS — H26491 Other secondary cataract, right eye: Secondary | ICD-10-CM | POA: Diagnosis not present

## 2022-02-16 DIAGNOSIS — H353122 Nonexudative age-related macular degeneration, left eye, intermediate dry stage: Secondary | ICD-10-CM | POA: Diagnosis not present

## 2022-02-16 DIAGNOSIS — H353211 Exudative age-related macular degeneration, right eye, with active choroidal neovascularization: Secondary | ICD-10-CM

## 2022-02-16 DIAGNOSIS — H35033 Hypertensive retinopathy, bilateral: Secondary | ICD-10-CM | POA: Diagnosis not present

## 2022-02-16 DIAGNOSIS — E119 Type 2 diabetes mellitus without complications: Secondary | ICD-10-CM

## 2022-02-16 DIAGNOSIS — I10 Essential (primary) hypertension: Secondary | ICD-10-CM

## 2022-02-16 DIAGNOSIS — Z961 Presence of intraocular lens: Secondary | ICD-10-CM

## 2022-02-16 MED ORDER — BEVACIZUMAB CHEMO INJECTION 1.25MG/0.05ML SYRINGE FOR KALEIDOSCOPE
1.2500 mg | INTRAVITREAL | Status: AC | PRN
  Administered 2022-02-16: 1.25 mg via INTRAVITREAL

## 2022-03-01 ENCOUNTER — Ambulatory Visit (HOSPITAL_COMMUNITY)
Admission: RE | Admit: 2022-03-01 | Discharge: 2022-03-01 | Disposition: A | Discharge status: Home / Self Care | Payer: HMO | Source: Ambulatory Visit | Attending: Vascular Surgery | Admitting: Vascular Surgery

## 2022-03-01 DIAGNOSIS — I6522 Occlusion and stenosis of left carotid artery: Secondary | ICD-10-CM | POA: Insufficient documentation

## 2022-03-01 DIAGNOSIS — I6523 Occlusion and stenosis of bilateral carotid arteries: Secondary | ICD-10-CM | POA: Diagnosis not present

## 2022-03-01 DIAGNOSIS — Z981 Arthrodesis status: Secondary | ICD-10-CM | POA: Diagnosis not present

## 2022-03-01 LAB — POCT I-STAT CREATININE: Creatinine, Ser: 1 mg/dL (ref 0.44–1.00)

## 2022-03-01 MED ORDER — IOHEXOL 350 MG/ML SOLN
75.0000 mL | Freq: Once | INTRAVENOUS | Status: AC | PRN
  Administered 2022-03-01: 75 mL via INTRAVENOUS

## 2022-03-02 ENCOUNTER — Telehealth: Payer: Self-pay | Admitting: Cardiovascular Disease

## 2022-03-02 MED ORDER — CARVEDILOL 6.25 MG PO TABS: 6.2500 mg | ORAL_TABLET | Freq: Two times a day (BID) | ORAL | 3 refills | Status: DC

## 2022-03-02 NOTE — Telephone Encounter (Signed)
Refilled as requested  Advised patient, verbalized understanding

## 2022-03-02 NOTE — Telephone Encounter (Signed)
*  STAT* If patient is at the pharmacy, call can be transferred to refill team.   1. Which medications need to be refilled? (please list name of each medication and dose if known) carvedilol (COREG) 6.25 MG tablet  2. Which pharmacy/location (including street and city if local pharmacy) is medication to be sent to? [facility] - [place], [place] - [place] [address]  3. Do they need a 30 day or 90 day supply? [place]

## 2022-03-03 ENCOUNTER — Ambulatory Visit: Payer: HMO | Admitting: Vascular Surgery

## 2022-03-03 ENCOUNTER — Encounter: Payer: Self-pay | Admitting: Vascular Surgery

## 2022-03-03 VITALS — BP 128/87 | HR 69 | Temp 98.2°F | Ht 62.0 in | Wt 164.6 lb

## 2022-03-03 DIAGNOSIS — I6522 Occlusion and stenosis of left carotid artery: Secondary | ICD-10-CM | POA: Diagnosis not present

## 2022-03-03 NOTE — Progress Notes (Signed)
[facility]ist of [place]  Patient name: [name] MRN: [id number] DOB: 03-Aug-1940 Sex: female  REASON FOR VISIT: Follow-up CT angiogram for evaluation of left carotid stenosis  HPI: [name] is a 81 y.o. female today for follow-up.  I saw her in the office several weeks ago with duplex suggesting severe left internal carotid artery stenosis.  She did have evidence of bilateral common carotid disease and I recommended CT angiogram for further evaluation.  She underwent her ET angiogram on 03/01/2022 and is here today for further discussion.  She remains asymptomatic from a carotid standpoint.  Past Medical History:  Diagnosis Date   Antral gastritis    EGD 11/15   Asthmatic bronchitis    Back pain    Breast cancer ([place])    right breast   CAD in native artery 03/10/2021   Chronic diastolic heart failure ([place]) 05/20/2015   Grade 2 diastolic dysfunction.  04/2015.   Chronic kidney disease    kidney function low   Diabetes mellitus    x 5 yrs   DVT of axillary vein, acute left (HCC) 07/24/2012   GERD (gastroesophageal reflux disease)    Glaucoma    POAG OU   Heart murmur    History of hiatal hernia    History of kidney stones    Hyperlipidemia 05/20/2015   Hypertension    Hypertensive retinopathy    OU   Hypothyroidism    Kidney stones    Macular degeneration    Wet OD, Dry OS   Mixed hyperlipidemia    OSA (obstructive sleep apnea) 12/17/2021   Peripheral venous insufficiency    Pinched nerve    right elbow   Pneumonia    Sigmoid diverticulitis    Snoring 03/10/2021   Vertigo    chonic    Family History  Problem Relation Age of Onset   CVA Maternal Grandmother 90       deceased   Heart disease Maternal Grandmother    Stroke Maternal Grandmother    Breast cancer Maternal Grandmother    Other Mother 66       Cause unknown   Cancer Mother        liver   Heart attack Brother 17       deceased    Breast cancer Sister    Leukemia Maternal Aunt    Glaucoma Maternal Uncle    Bone cancer Maternal Uncle    Spina bifida Daughter     SOCIAL HISTORY: Social History   Tobacco Use   Smoking status: Former    Types: Cigarettes    Quit date: 12/10/2013    Years since quitting: 8.2    Passive exposure: Never   Smokeless tobacco: Never   Tobacco comments:    Smoke 1-1 1/2 packs a day  Substance Use Topics   Alcohol use: No    Allergies  Allergen Reactions   Alphagan [Brimonidine] Itching   Diflunisal Swelling    Other reaction(s): ENTIRE BODY SWELLING   Vioxx [Rofecoxib] Shortness Of Breath   Metformin And Related     Kidney failure   Nexlizet [Bempedoic Acid-Ezetimibe]     Causes elevated Liver and Kidney function   Pravastatin    Repatha [Evolocumab]     MYALGIAS   Codeine Rash   Elemental Sulfur Rash   Motrin [Ibuprofen] Rash   Penicillins Rash    Current Outpatient Medications  Medication Sig Dispense Refill   albuterol (PROVENTIL HFA;VENTOLIN HFA) 108 (  90 BASE) MCG/ACT inhaler Inhale 2 puffs into the lungs every 4 (four) hours as needed for shortness of breath. 1 Inhaler 0   Alirocumab (PRALUENT) 75 MG/ML SOAJ Inject 75 mg into the skin every 14 (fourteen) days. 2 mL 11   amitriptyline (ELAVIL) 25 MG tablet Take 25 mg by mouth at bedtime.     anastrozole (ARIMIDEX) 1 MG tablet TAKE 1 TABLET BY MOUTH EVERY DAY 90 tablet 3   Black Cohosh 40 MG CAPS Take 40 mg by mouth 2 (two) times daily.     carvedilol (COREG) 6.25 MG tablet Take 1 tablet (6.25 mg total) by mouth 2 (two) times daily. 180 tablet 3   cetirizine (ZYRTEC) 10 MG tablet Take 10 mg by mouth daily.     Cholecalciferol (VITAMIN D-3) 1000 units CAPS Take 1,000 Units by mouth daily.     diazepam (VALIUM) 2 MG tablet Take 2 mg by mouth 2 (two) times daily as needed for anxiety (dizziness).     dorzolamide-timolol (COSOPT) 22.3-6.8 MG/ML ophthalmic solution INSTILL 1 DROP INTO RIGHT EYE TWICE A DAY (Patient taking  differently: Place 1 drop into the right eye 2 (two) times daily.) 30 mL 2   esomeprazole (NEXIUM) 20 MG capsule Take 20 mg by mouth daily at 12 noon.     FARXIGA 5 MG TABS tablet Take 5 mg by mouth every morning.     furosemide (LASIX) 40 MG tablet TAKE 1 TABLET (40 MG TOTAL) BY MOUTH DAILY. MAY TAKE EXTRA DAILY AS NEEDED FOR SWELLING 180 tablet 0   gabapentin (NEURONTIN) 300 MG capsule Take 300 mg by mouth 3 (three) times daily.     glimepiride (AMARYL) 2 MG tablet Take 2 mg by mouth daily.  2   hydrALAZINE (APRESOLINE) 25 MG tablet TAKE ONE TABLET BY MOUTH AT BREAKFAST AND AT BEDTIME 180 tablet 1   isosorbide mononitrate (IMDUR) 30 MG 24 hr tablet TAKE ONE TABLET BY MOUTH ONCE DAILY 90 tablet 3   latanoprost (XALATAN) 0.005 % ophthalmic solution Place 1 drop into both eyes at bedtime.      meclizine (ANTIVERT) 25 MG tablet Take 25 mg by mouth 2 (two) times daily as needed for dizziness.     Multiple Vitamins-Minerals (PRESERVISION AREDS 2 PO) Take 1 capsule by mouth in the morning and at bedtime.     Omega-3 Fatty Acids (FISH OIL) 1200 MG CAPS Take 1,200 mg by mouth 2 (two) times daily.     polyethylene glycol (MIRALAX / GLYCOLAX) packet Take 17 g by mouth daily.     potassium chloride SA (KLOR-CON M) 20 MEQ tablet TAKE ONE-HALF TABLET BY  MOUTH DAILY 45 tablet 2   prednisoLONE acetate (PRED FORTE) 1 % ophthalmic suspension Place 1 drop into the right eye 4 (four) times daily.     tetrahydrozoline 0.05 % ophthalmic solution Place 1 drop into both eyes at bedtime.     TRADJENTA 5 MG TABS tablet Take 5 mg by mouth daily.     vitamin B-12 (CYANOCOBALAMIN) 100 MCG tablet Take 100 mcg by mouth daily.     warfarin (COUMADIN) 2 MG tablet TAKE ONE TABLET BY MOUTH EVERYDAY AT BEDTIME AS DIRECTED by THE coumadin Clinic 40 tablet 2   No current facility-administered medications for this visit.    REVIEW OF SYSTEMS:  '[X]'$  denotes positive finding, '[ ]'$  denotes negative finding Cardiac  Comments:   Chest pain or chest pressure:    Shortness of breath upon exertion:    Short  of breath when lying flat:    Irregular heart rhythm:        Vascular    Pain in calf, thigh, or hip brought on by ambulation:    Pain in feet at night that wakes you up from your sleep:     Blood clot in your veins:    Leg swelling:           PHYSICAL EXAM: Vitals:   03/03/22 1338  BP: 128/87  Pulse: 69  Temp: 98.2 F (36.8 C)  TempSrc: Temporal  SpO2: 94%  Weight: 164 lb 9.6 oz (74.7 kg)  Height: '5\' 2"'$  (1.575 m)    GENERAL: The patient is a well-nourished female, in no acute distress. The vital signs are documented above.  DATA:  CT scan shows critical stenosis in her left internal carotid artery at the bifurcation.  She does have significant calcification in her common carotid at the bifurcation bilaterally but not proximally and not at the arch.  She does not have any evidence of more distal terminal carotid artery stenosis.   Medical issues: I had a long discussion with the patient regarding these findings.  I did explain that this is her nondominant hemispheres and she is right-handed.  Next discussed the approximately 5% annual risk for TIA or stroke based on severe asymptomatic disease.  I have recommended left endarterectomy for reduction of stroke risk.  I explained the procedure in detail.  Explained that this would be a probable 1 night hospitalization following her left carotid endarterectomy.  I did discuss potential risk to include 1 to 1-1/2% risk of stroke with surgery and also a very slight risk of cranial nerve injury.  She understands and wishes to proceed.  She understands that my partner, Dr. Stanford Breed, will be doing the procedure at [facility].  We will schedule this at her earliest convenience.  We will hold her Coumadin for appropriate period of time prior to the procedure and resume following surgery.     Rosetta Posner, MD FACS [facility] of  [facility] [phone]  Note: Portions of this report may have been transcribed using voice recognition software.  Every effort has been made to ensure accuracy; however, inadvertent computerized transcription errors may still be present.

## 2022-03-03 NOTE — H&P (View-Only) (Signed)
Vascular and Vein Specialist of [place]  Patient name: [name] MRN: [id number] DOB: 06-09-1941 Sex: female  REASON FOR VISIT: Follow-up CT angiogram for evaluation of left carotid stenosis  HPI: [name] is a 81 y.o. female today for follow-up.  I saw her in the office several weeks ago with duplex suggesting severe left internal carotid artery stenosis.  She did have evidence of bilateral common carotid disease and I recommended CT angiogram for further evaluation.  She underwent her ET angiogram on 03/01/2022 and is here today for further discussion.  She remains asymptomatic from a carotid standpoint.  Past Medical History:  Diagnosis Date   Antral gastritis    EGD 11/15   Asthmatic bronchitis    Back pain    Breast cancer ([place])    right breast   CAD in native artery 03/10/2021   Chronic diastolic heart failure ([place]) 05/20/2015   Grade 2 diastolic dysfunction.  04/2015.   Chronic kidney disease    kidney function low   Diabetes mellitus    x 5 yrs   DVT of axillary vein, acute left (HCC) 07/24/2012   GERD (gastroesophageal reflux disease)    Glaucoma    POAG OU   Heart murmur    History of hiatal hernia    History of kidney stones    Hyperlipidemia 05/20/2015   Hypertension    Hypertensive retinopathy    OU   Hypothyroidism    Kidney stones    Macular degeneration    Wet OD, Dry OS   Mixed hyperlipidemia    OSA (obstructive sleep apnea) 12/17/2021   Peripheral venous insufficiency    Pinched nerve    right elbow   Pneumonia    Sigmoid diverticulitis    Snoring 03/10/2021   Vertigo    chonic    Family History  Problem Relation Age of Onset   CVA Maternal Grandmother 90       deceased   Heart disease Maternal Grandmother    Stroke Maternal Grandmother    Breast cancer Maternal Grandmother    Other Mother 38       Cause unknown   Cancer Mother        liver   Heart attack Brother 71       deceased    Breast cancer Sister    Leukemia Maternal Aunt    Glaucoma Maternal Uncle    Bone cancer Maternal Uncle    Spina bifida Daughter     SOCIAL HISTORY: Social History   Tobacco Use   Smoking status: Former    Types: Cigarettes    Quit date: 12/10/2013    Years since quitting: 8.2    Passive exposure: Never   Smokeless tobacco: Never   Tobacco comments:    Smoke 1-1 1/2 packs a day  Substance Use Topics   Alcohol use: No    Allergies  Allergen Reactions   Alphagan [Brimonidine] Itching   Diflunisal Swelling    Other reaction(s): ENTIRE BODY SWELLING   Vioxx [Rofecoxib] Shortness Of Breath   Metformin And Related     Kidney failure   Nexlizet [Bempedoic Acid-Ezetimibe]     Causes elevated Liver and Kidney function   Pravastatin    Repatha [Evolocumab]     MYALGIAS   Codeine Rash   Elemental Sulfur Rash   Motrin [Ibuprofen] Rash   Penicillins Rash    Current Outpatient Medications  Medication Sig Dispense Refill   albuterol (PROVENTIL HFA;VENTOLIN HFA) 108 (  90 BASE) MCG/ACT inhaler Inhale 2 puffs into the lungs every 4 (four) hours as needed for shortness of breath. 1 Inhaler 0   Alirocumab (PRALUENT) 75 MG/ML SOAJ Inject 75 mg into the skin every 14 (fourteen) days. 2 mL 11   amitriptyline (ELAVIL) 25 MG tablet Take 25 mg by mouth at bedtime.     anastrozole (ARIMIDEX) 1 MG tablet TAKE 1 TABLET BY MOUTH EVERY DAY 90 tablet 3   Black Cohosh 40 MG CAPS Take 40 mg by mouth 2 (two) times daily.     carvedilol (COREG) 6.25 MG tablet Take 1 tablet (6.25 mg total) by mouth 2 (two) times daily. 180 tablet 3   cetirizine (ZYRTEC) 10 MG tablet Take 10 mg by mouth daily.     Cholecalciferol (VITAMIN D-3) 1000 units CAPS Take 1,000 Units by mouth daily.     diazepam (VALIUM) 2 MG tablet Take 2 mg by mouth 2 (two) times daily as needed for anxiety (dizziness).     dorzolamide-timolol (COSOPT) 22.3-6.8 MG/ML ophthalmic solution INSTILL 1 DROP INTO RIGHT EYE TWICE A DAY (Patient taking  differently: Place 1 drop into the right eye 2 (two) times daily.) 30 mL 2   esomeprazole (NEXIUM) 20 MG capsule Take 20 mg by mouth daily at 12 noon.     FARXIGA 5 MG TABS tablet Take 5 mg by mouth every morning.     furosemide (LASIX) 40 MG tablet TAKE 1 TABLET (40 MG TOTAL) BY MOUTH DAILY. MAY TAKE EXTRA DAILY AS NEEDED FOR SWELLING 180 tablet 0   gabapentin (NEURONTIN) 300 MG capsule Take 300 mg by mouth 3 (three) times daily.     glimepiride (AMARYL) 2 MG tablet Take 2 mg by mouth daily.  2   hydrALAZINE (APRESOLINE) 25 MG tablet TAKE ONE TABLET BY MOUTH AT BREAKFAST AND AT BEDTIME 180 tablet 1   isosorbide mononitrate (IMDUR) 30 MG 24 hr tablet TAKE ONE TABLET BY MOUTH ONCE DAILY 90 tablet 3   latanoprost (XALATAN) 0.005 % ophthalmic solution Place 1 drop into both eyes at bedtime.      meclizine (ANTIVERT) 25 MG tablet Take 25 mg by mouth 2 (two) times daily as needed for dizziness.     Multiple Vitamins-Minerals (PRESERVISION AREDS 2 PO) Take 1 capsule by mouth in the morning and at bedtime.     Omega-3 Fatty Acids (FISH OIL) 1200 MG CAPS Take 1,200 mg by mouth 2 (two) times daily.     polyethylene glycol (MIRALAX / GLYCOLAX) packet Take 17 g by mouth daily.     potassium chloride SA (KLOR-CON M) 20 MEQ tablet TAKE ONE-HALF TABLET BY  MOUTH DAILY 45 tablet 2   prednisoLONE acetate (PRED FORTE) 1 % ophthalmic suspension Place 1 drop into the right eye 4 (four) times daily.     tetrahydrozoline 0.05 % ophthalmic solution Place 1 drop into both eyes at bedtime.     TRADJENTA 5 MG TABS tablet Take 5 mg by mouth daily.     vitamin B-12 (CYANOCOBALAMIN) 100 MCG tablet Take 100 mcg by mouth daily.     warfarin (COUMADIN) 2 MG tablet TAKE ONE TABLET BY MOUTH EVERYDAY AT BEDTIME AS DIRECTED by THE coumadin Clinic 40 tablet 2   No current facility-administered medications for this visit.    REVIEW OF SYSTEMS:  '[X]'$  denotes positive finding, '[ ]'$  denotes negative finding Cardiac  Comments:   Chest pain or chest pressure:    Shortness of breath upon exertion:    Short  of breath when lying flat:    Irregular heart rhythm:        Vascular    Pain in calf, thigh, or hip brought on by ambulation:    Pain in feet at night that wakes you up from your sleep:     Blood clot in your veins:    Leg swelling:           PHYSICAL EXAM: Vitals:   03/03/22 1338  BP: 128/87  Pulse: 69  Temp: 98.2 F (36.8 C)  TempSrc: Temporal  SpO2: 94%  Weight: 164 lb 9.6 oz (74.7 kg)  Height: '5\' 2"'$  (1.575 m)    GENERAL: The patient is a well-nourished female, in no acute distress. The vital signs are documented above.  DATA:  CT scan shows critical stenosis in her left internal carotid artery at the bifurcation.  She does have significant calcification in her common carotid at the bifurcation bilaterally but not proximally and not at the arch.  She does not have any evidence of more distal terminal carotid artery stenosis.   Medical issues: I had a long discussion with the patient regarding these findings.  I did explain that this is her nondominant hemispheres and she is right-handed.  Next discussed the approximately 5% annual risk for TIA or stroke based on severe asymptomatic disease.  I have recommended left endarterectomy for reduction of stroke risk.  I explained the procedure in detail.  Explained that this would be a probable 1 night hospitalization following her left carotid endarterectomy.  I did discuss potential risk to include 1 to 1-1/2% risk of stroke with surgery and also a very slight risk of cranial nerve injury.  She understands and wishes to proceed.  She understands that my partner, Dr. Stanford Breed, will be doing the procedure at [facility].  We will schedule this at her earliest convenience.  We will hold her Coumadin for appropriate period of time prior to the procedure and resume following surgery.     Rosetta Posner, MD FACS [facility] of  [facility] [phone]  Note: Portions of this report may have been transcribed using voice recognition software.  Every effort has been made to ensure accuracy; however, inadvertent computerized transcription errors may still be present.

## 2022-03-04 DIAGNOSIS — E1165 Type 2 diabetes mellitus with hyperglycemia: Secondary | ICD-10-CM | POA: Diagnosis not present

## 2022-03-04 DIAGNOSIS — I10 Essential (primary) hypertension: Secondary | ICD-10-CM | POA: Diagnosis not present

## 2022-03-04 DIAGNOSIS — I2510 Atherosclerotic heart disease of native coronary artery without angina pectoris: Secondary | ICD-10-CM | POA: Diagnosis not present

## 2022-03-05 ENCOUNTER — Other Ambulatory Visit: Payer: Self-pay

## 2022-03-05 DIAGNOSIS — I6522 Occlusion and stenosis of left carotid artery: Secondary | ICD-10-CM

## 2022-03-09 ENCOUNTER — Encounter (HOSPITAL_COMMUNITY): Payer: Self-pay | Admitting: Vascular Surgery

## 2022-03-09 ENCOUNTER — Ambulatory Visit: Payer: HMO | Attending: Cardiology | Admitting: *Deleted

## 2022-03-09 ENCOUNTER — Other Ambulatory Visit: Payer: Self-pay

## 2022-03-09 DIAGNOSIS — I82A12 Acute embolism and thrombosis of left axillary vein: Secondary | ICD-10-CM | POA: Diagnosis not present

## 2022-03-09 DIAGNOSIS — Z5181 Encounter for therapeutic drug level monitoring: Secondary | ICD-10-CM | POA: Diagnosis not present

## 2022-03-09 LAB — POCT INR: INR: 1.7 — AB (ref 2.0–3.0)

## 2022-03-09 NOTE — Anesthesia Preprocedure Evaluation (Signed)
Anesthesia Evaluation  Patient identified by MRN, date of birth, ID band Patient awake    Reviewed: Allergy & Precautions, NPO status , Patient's Chart, lab work & pertinent test results  History of Anesthesia Complications (+) PONV and history of anesthetic complications  Airway Mallampati: II  TM Distance: >3 FB Neck ROM: Full    Dental  (+) Upper Dentures, Dental Advisory Given   Pulmonary asthma , sleep apnea , former smoker,    Pulmonary exam normal        Cardiovascular hypertension, Pt. on medications and Pt. on home beta blockers + CAD, +CHF and + DVT   Rhythm:Regular Rate:Normal     Neuro/Psych Carotid stenosis negative psych ROS   GI/Hepatic Neg liver ROS, hiatal hernia, GERD  Medicated and Controlled,  Endo/Other  diabetes, Type 2, Oral Hypoglycemic AgentsHypothyroidism   Renal/GU CRFRenal disease  negative genitourinary   Musculoskeletal  (+) Arthritis , Osteoarthritis,  Prior ACDF   Abdominal Normal abdominal exam  (+)   Peds  Hematology  (+) Blood dyscrasia, anemia , Lab Results      Component                Value               Date                      WBC                      6.7                 03/10/2022                HGB                      15.7 (H)            03/10/2022                HCT                      49.0 (H)            03/10/2022                MCV                      93.7                03/10/2022                PLT                      224                 03/10/2022           Lab Results      Component                Value               Date                      NA                       144  03/10/2022                K                        3.9                 03/10/2022                CO2                      27                  03/10/2022                GLUCOSE                  131 (H)             03/10/2022                BUN                      24 (H)               03/10/2022                CREATININE               1.46 (H)            03/10/2022                CALCIUM                  9.9                 03/10/2022                EGFR                     53 (L)              08/20/2021                GFRNONAA                 36 (L)              03/10/2022             Anesthesia Other Findings   Reproductive/Obstetrics                          Anesthesia Physical Anesthesia Plan  ASA: 3  Anesthesia Plan: General   Post-op Pain Management:    Induction: Intravenous  PONV Risk Score and Plan: 4 or greater and Ondansetron, Dexamethasone, Midazolam and Treatment may vary due to age or medical condition  Airway Management Planned: Mask and Oral ETT  Additional Equipment: Arterial line  Intra-op Plan:   Post-operative Plan: Extubation in OR  Informed Consent: I have reviewed the patients History and Physical, chart, labs and discussed the procedure including the risks, benefits and alternatives for the proposed anesthesia with the patient or authorized representative who has indicated his/her understanding and acceptance.     Dental advisory given  Plan Discussed with: CRNA  Anesthesia Plan Comments: (PAT note by Karoline Caldwell, PA-C: Follows with cardiology for history of CAD (50% LAD), chronic diastolic heart failure (grade 2),hypertension, hyperlipidemia, prior  DVT on warfarin, OSA on CPAP.  Nuclear stress test April 2021 was low risk.  Last seen by Dr. Oval Linsey 12/17/2021 and noted to be stable from cardiac standpoint.  Carotid Dopplers were ordered at that time which showed 80-99% stenosis in the left ICA she was referred to vascular surgery for further management.  She was seen by Dr. Donnetta Hutching who ordered CT angiogram which was done 03/01/2022 and showed critical stenosis in her left internal carotid artery at the bifurcation.  Endarterectomy was recommended.  Patient reported last dose Coumadin  03/06/2022.  History of right breast cancer s/p lumpectomy and XRT.  History of C4-6 ACDF.  History of non-insulin-dependent DM2, no recent A1c available in epic.  Patient will need day of surgery labs and evaluation.  EKG 03/10/2021: Sinus rhythm with premature supraventricular complexes.  Rate 74.  Right bundle branch block.  Left posterior fascicular block.  CTA neck 03/01/2022: IMPRESSION: 1. Bulky calcified plaque in the distal left common carotid artery extending to the bifurcation resulting in high-grade stenosis of the distal common carotid artery/origin of the ICA, estimated at greater than 75%. 2. Calcified plaque at the right carotid bifurcation without greater than 50% stenosis. 3. Patent vertebral arteries in the neck.  Aortic Atherosclerosis (ICD10-I70.0) and Emphysema (ICD10-J43.9).  Carotid duplex 12/28/2021: Summary:  Right Carotid: Velocities in the right ICA are consistent with a 1-39% stenosis. Hemodynamically significant plaque >50% visualized in the CCA.  Left Carotid: Velocities in the left ICA are consistent with a 80-99% stenosis. Hemodynamically significant plaque >50% visualized in the CCA. The ECA appears <50% stenosed.  Vertebrals: Bilateral vertebral arteries demonstrate antegrade flow.  Subclavians: Normal flow hemodynamics were seen in bilateral subclavian arteries.   Nuclear stress 10/09/2019: . Nuclear stress EF: 74%. The left ventricular ejection fraction is hyperdynamic (>65%). . There was no ST segment deviation noted during stress. . This is a low risk study. There is no evidence of ischemia or previous infarction. . The study is normal.  Cardiac cath 05/26/15: . Mid LAD lesion, 50% stenosed. FFR of this lesion showed value of 0.85. This is felt to be nonsignificant. . Normal LVEDP. Continue aggressive medical therapy. Would consider adding long-acting nitrate as the patient may have some component of vasospasm. Of note, in the future , if  repeat catheterization was needed , would consider using a 5 [place] guide catheter as her left main is very short and the catheter tends to deep seatInto either the LAD or the circumflex.   )       Anesthesia Quick Evaluation

## 2022-03-09 NOTE — Patient Instructions (Signed)
Pending Lt CEA on 03/10/22  Took last dose of warfarin 03/06/22 Restart warfarin 1 tablet daily when cleared by surgeon  Recheck INR in 10 days.  Call Coumadin clinic for any questions or changes in medications.

## 2022-03-09 NOTE — Progress Notes (Addendum)
PCP - Sharilyn Sites, MD Cardiologist - Skeet Latch, MD  PPM/ICD - denies Device Orders - n/a Rep Notified - n/a  Chest x-ray - 08/24/21 EKG - will be done the day of surgery Stress Test - 10/09/19 ECHO - 04/25/15 Cardiac Cath - 05/26/15  CPAP - yes  Fasting Blood Sugar - 146  Checks Blood Sugar - patient has a new CBG machine and she doesn't know how to use it. Patient was instructed to go to PCP office and ask how to use the new machine. Patient verbalized understanding.   Blood Thinner Instructions: Coumadin - last dose on 09/02 per patient Aspirin Instructions: Patient was instructed: As of today, STOP taking any Aspirin (unless otherwise instructed by your surgeon) Aleve, Naproxen, Ibuprofen, Motrin, Advil, Goody's, BC's, all herbal medications, fish oil, and all vitamins.  ERAS Protcol - n/a  COVID TEST- n/a  Anesthesia review: yes - cardiac history  Patient verbally denies any shortness of breath, fever, cough and chest pain during phone call   -------------  SDW INSTRUCTIONS given:  Your procedure is scheduled on Wednesday, September 6th, 2023.  Report to [facility] Main Entrance "A" at 06:00 A.M., and check in at the Admitting office.  Call this number if you have problems the morning of surgery:  [phone]   Remember:  Do not eat or drink after midnight the night before your surgery    Take these medicines the morning of surgery with A SIP OF WATER Coreg, Zyrtec, Gabapentin, Hydralazine, Imdur. PRN: Valium, Meclizine, eye drops, inhaler   Do not take Tradjenta the morning of surgery.  Do not take [place] today and tomorrow.  Do not take Glimepiride tonight or tomorrow morning.   How do I manage my blood sugar before surgery? Check your blood sugar at least 4 times a day, starting 2 days before surgery, to make sure that the level is not too high or low.  Check your blood sugar the morning of your surgery when you wake up and every 2 hours until  you get to the Short Stay unit.  If your blood sugar is less than 70 mg/dL, you will need to treat for low blood sugar: Do not take insulin. Treat a low blood sugar (less than 70 mg/dL) with  cup of clear juice (cranberry or apple), 4 glucose tablets, OR glucose gel. Recheck blood sugar in 15 minutes after treatment (to make sure it is greater than 70 mg/dL). If your blood sugar is not greater than 70 mg/dL on recheck, call [phone] for further instructions. Report your blood sugar to the short stay nurse when you get to Short Stay.   The day of surgery:                     Do not wear jewelry, make up, or nail polish            Do not wear lotions, powders, perfumes, or deodorant.            Do not shave 48 hours prior to surgery.              Do not bring valuables to the hospital.            [facility] is not responsible for any belongings or valuables.  Do NOT Smoke (Tobacco/Vaping) 24 hours prior to your procedure If you use a CPAP at night, you may bring all equipment for your overnight stay.   Contacts, glasses, dentures or bridgework  may not be worn into surgery.      For patients admitted to the hospital, discharge time will be determined by your treatment team.   Patients discharged the day of surgery will not be allowed to drive home, and someone needs to stay with them for 24 hours.    Special instructions:   [facility]- Preparing For Surgery  Before surgery, you can play an important role. Because skin is not sterile, your skin needs to be as free of germs as possible. You can reduce the number of germs on your skin by washing with CHG (chlorahexidine gluconate) Soap before surgery.  CHG is an antiseptic cleaner which kills germs and bonds with the skin to continue killing germs even after washing.    Oral Hygiene is also important to reduce your risk of infection.  Remember - BRUSH YOUR TEETH THE MORNING OF SURGERY WITH YOUR REGULAR TOOTHPASTE  Please do not use  if you have an allergy to CHG or antibacterial soaps. If your skin becomes reddened/irritated stop using the CHG.  Do not shave (including legs and underarms) for at least 48 hours prior to first CHG shower. It is OK to shave your face.  Please follow these instructions carefully.   Shower the NIGHT BEFORE SURGERY and the MORNING OF SURGERY with DIAL Soap.   Pat yourself dry with a CLEAN TOWEL.  Wear CLEAN PAJAMAS to bed the night before surgery  Place CLEAN SHEETS on your bed the night of your first shower and DO NOT SLEEP WITH PETS.   Day of Surgery: Please shower morning of surgery  Wear Clean/Comfortable clothing the morning of surgery Do not apply any deodorants/lotions.   Remember to brush your teeth WITH YOUR REGULAR TOOTHPASTE.   Questions were answered. Patient verbalized understanding of instructions.

## 2022-03-09 NOTE — Progress Notes (Signed)
Anesthesia Chart Review: Same day workup  Follows with cardiology for history of CAD (50% LAD), chronic diastolic heart failure (grade 2), hypertension, hyperlipidemia, prior DVT on warfarin, OSA on CPAP.  Nuclear stress test April 2021 was low risk.  Last seen by Dr. Oval Linsey 12/17/2021 and noted to be stable from cardiac standpoint.  Carotid Dopplers were ordered at that time which showed 80-99% stenosis in the left ICA she was referred to vascular surgery for further management.  She was seen by Dr. Donnetta Hutching who ordered CT angiogram which was done 03/01/2022 and showed critical stenosis in her left internal carotid artery at the bifurcation.  Endarterectomy was recommended.  Patient reported last dose Coumadin 03/06/2022.  History of right breast cancer s/p lumpectomy and XRT.  History of C4-6 ACDF.  History of non-insulin-dependent DM2, no recent A1c available in epic.  Patient will need day of surgery labs and evaluation.  EKG 03/10/2021: Sinus rhythm with premature supraventricular complexes.  Rate 74.  Right bundle branch block.  Left posterior fascicular block.  CTA neck 03/01/2022: IMPRESSION: 1. Bulky calcified plaque in the distal left common carotid artery extending to the bifurcation resulting in high-grade stenosis of the distal common carotid artery/origin of the ICA, estimated at greater than 75%. 2. Calcified plaque at the right carotid bifurcation without greater than 50% stenosis. 3. Patent vertebral arteries in the neck.   Aortic Atherosclerosis (ICD10-I70.0) and Emphysema (ICD10-J43.9).  Carotid duplex 12/28/2021: Summary:  Right Carotid: Velocities in the right ICA are consistent with a 1-39% stenosis. Hemodynamically significant plaque >50% visualized in the CCA.  Left Carotid: Velocities in the left ICA are consistent with a 80-99% stenosis. Hemodynamically significant plaque >50% visualized in the CCA. The ECA appears <50% stenosed.  Vertebrals:  Bilateral vertebral  arteries demonstrate antegrade flow.  Subclavians: Normal flow hemodynamics were seen in bilateral subclavian arteries.   Nuclear stress 10/09/2019: Nuclear stress EF: 74%. The left ventricular ejection fraction is hyperdynamic (>65%). There was no ST segment deviation noted during stress. This is a low risk study. There is no evidence of ischemia or previous infarction. The study is normal.  Cardiac cath 05/26/15: Mid LAD lesion, 50% stenosed. FFR of this lesion showed value of 0.85. This is felt to be nonsignificant. Normal LVEDP. Continue aggressive medical therapy. Would consider adding long-acting nitrate as the patient may have some component of vasospasm. Of note, in the future , if repeat catheterization was needed , would consider using a 5 [place] guide catheter as her left main is very short and the catheter tends to deep seat  Into either the LAD or the circumflex.    Wynonia Musty [facility] Short Stay Center/Anesthesiology Phone [phone] 03/09/2022 11:57 AM

## 2022-03-10 ENCOUNTER — Inpatient Hospital Stay (HOSPITAL_COMMUNITY): Payer: HMO | Admitting: Physician Assistant

## 2022-03-10 ENCOUNTER — Inpatient Hospital Stay (HOSPITAL_COMMUNITY)
Admission: RE | Admit: 2022-03-10 | Discharge: 2022-03-13 | DRG: 038 | Disposition: A | Discharge status: Home Health | Payer: HMO | Source: Ambulatory Visit | Attending: Vascular Surgery | Admitting: Vascular Surgery

## 2022-03-10 ENCOUNTER — Encounter (HOSPITAL_COMMUNITY)
Admission: RE | Disposition: A | Discharge status: Home Health | Payer: Self-pay | Source: Ambulatory Visit | Attending: Vascular Surgery

## 2022-03-10 ENCOUNTER — Encounter (HOSPITAL_COMMUNITY): Payer: Self-pay | Admitting: Vascular Surgery

## 2022-03-10 ENCOUNTER — Other Ambulatory Visit: Payer: Self-pay

## 2022-03-10 DIAGNOSIS — I6522 Occlusion and stenosis of left carotid artery: Secondary | ICD-10-CM | POA: Diagnosis not present

## 2022-03-10 DIAGNOSIS — Z803 Family history of malignant neoplasm of breast: Secondary | ICD-10-CM

## 2022-03-10 DIAGNOSIS — Z885 Allergy status to narcotic agent status: Secondary | ICD-10-CM | POA: Diagnosis not present

## 2022-03-10 DIAGNOSIS — E782 Mixed hyperlipidemia: Secondary | ICD-10-CM | POA: Diagnosis present

## 2022-03-10 DIAGNOSIS — I2510 Atherosclerotic heart disease of native coronary artery without angina pectoris: Secondary | ICD-10-CM | POA: Diagnosis not present

## 2022-03-10 DIAGNOSIS — Z880 Allergy status to penicillin: Secondary | ICD-10-CM

## 2022-03-10 DIAGNOSIS — I5032 Chronic diastolic (congestive) heart failure: Secondary | ICD-10-CM | POA: Diagnosis present

## 2022-03-10 DIAGNOSIS — I6529 Occlusion and stenosis of unspecified carotid artery: Secondary | ICD-10-CM | POA: Diagnosis not present

## 2022-03-10 DIAGNOSIS — Z7984 Long term (current) use of oral hypoglycemic drugs: Secondary | ICD-10-CM | POA: Diagnosis not present

## 2022-03-10 DIAGNOSIS — Z79899 Other long term (current) drug therapy: Secondary | ICD-10-CM | POA: Diagnosis not present

## 2022-03-10 DIAGNOSIS — Z87891 Personal history of nicotine dependence: Secondary | ICD-10-CM

## 2022-03-10 DIAGNOSIS — K219 Gastro-esophageal reflux disease without esophagitis: Secondary | ICD-10-CM | POA: Diagnosis present

## 2022-03-10 DIAGNOSIS — Z8249 Family history of ischemic heart disease and other diseases of the circulatory system: Secondary | ICD-10-CM | POA: Diagnosis not present

## 2022-03-10 DIAGNOSIS — Z7901 Long term (current) use of anticoagulants: Secondary | ICD-10-CM

## 2022-03-10 DIAGNOSIS — Z823 Family history of stroke: Secondary | ICD-10-CM

## 2022-03-10 DIAGNOSIS — I130 Hypertensive heart and chronic kidney disease with heart failure and stage 1 through stage 4 chronic kidney disease, or unspecified chronic kidney disease: Secondary | ICD-10-CM

## 2022-03-10 DIAGNOSIS — Z806 Family history of leukemia: Secondary | ICD-10-CM

## 2022-03-10 DIAGNOSIS — E1141 Type 2 diabetes mellitus with diabetic mononeuropathy: Secondary | ICD-10-CM | POA: Diagnosis present

## 2022-03-10 DIAGNOSIS — R296 Repeated falls: Secondary | ICD-10-CM | POA: Diagnosis not present

## 2022-03-10 DIAGNOSIS — R112 Nausea with vomiting, unspecified: Secondary | ICD-10-CM | POA: Diagnosis not present

## 2022-03-10 DIAGNOSIS — Z86718 Personal history of other venous thrombosis and embolism: Secondary | ICD-10-CM | POA: Diagnosis not present

## 2022-03-10 DIAGNOSIS — N1832 Chronic kidney disease, stage 3b: Secondary | ICD-10-CM | POA: Diagnosis not present

## 2022-03-10 DIAGNOSIS — I509 Heart failure, unspecified: Secondary | ICD-10-CM

## 2022-03-10 DIAGNOSIS — G4733 Obstructive sleep apnea (adult) (pediatric): Secondary | ICD-10-CM | POA: Diagnosis present

## 2022-03-10 DIAGNOSIS — E039 Hypothyroidism, unspecified: Secondary | ICD-10-CM | POA: Diagnosis not present

## 2022-03-10 DIAGNOSIS — Z888 Allergy status to other drugs, medicaments and biological substances status: Secondary | ICD-10-CM | POA: Diagnosis not present

## 2022-03-10 DIAGNOSIS — E1122 Type 2 diabetes mellitus with diabetic chronic kidney disease: Secondary | ICD-10-CM | POA: Diagnosis not present

## 2022-03-10 DIAGNOSIS — Z853 Personal history of malignant neoplasm of breast: Secondary | ICD-10-CM

## 2022-03-10 DIAGNOSIS — N189 Chronic kidney disease, unspecified: Secondary | ICD-10-CM | POA: Diagnosis not present

## 2022-03-10 HISTORY — PX: ENDARTERECTOMY: SHX5162

## 2022-03-10 HISTORY — PX: PATCH ANGIOPLASTY: SHX6230

## 2022-03-10 HISTORY — DX: Nausea with vomiting, unspecified: Z98.890

## 2022-03-10 HISTORY — DX: Nausea with vomiting, unspecified: R11.2

## 2022-03-10 HISTORY — DX: Other specified postprocedural states: Z98.890

## 2022-03-10 LAB — URINALYSIS, ROUTINE W REFLEX MICROSCOPIC
Bilirubin Urine: NEGATIVE
Glucose, UA: >=500 mg/dL — AB
Hgb urine dipstick: NEGATIVE
Ketones, ur: NEGATIVE mg/dL
Nitrite: NEGATIVE
Protein, ur: NEGATIVE mg/dL
Specific Gravity, Urine: 1.017 (ref 1.005–1.030)
pH: 7 (ref 5.0–8.0)

## 2022-03-10 LAB — GLUCOSE, CAPILLARY
Glucose-Capillary: 141 mg/dL — ABNORMAL HIGH (ref 70–99)
Glucose-Capillary: 160 mg/dL — ABNORMAL HIGH (ref 70–99)
Glucose-Capillary: 118 mg/dL — ABNORMAL HIGH (ref 70–99)
Glucose-Capillary: 164 mg/dL — ABNORMAL HIGH (ref 70–99)
Glucose-Capillary: 224 mg/dL — ABNORMAL HIGH (ref 70–99)

## 2022-03-10 LAB — CBC
HCT: 49 % — ABNORMAL HIGH (ref 36.0–46.0)
Hemoglobin: 15.7 g/dL — ABNORMAL HIGH (ref 12.0–15.0)
MCH: 30 pg (ref 26.0–34.0)
MCHC: 32 g/dL (ref 30.0–36.0)
MCV: 93.7 fL (ref 80.0–100.0)
Platelets: 224 10*3/uL (ref 150–400)
RBC: 5.23 MIL/uL — ABNORMAL HIGH (ref 3.87–5.11)
RDW: 13.1 % (ref 11.5–15.5)
WBC: 6.7 10*3/uL (ref 4.0–10.5)
nRBC: 0 % (ref 0.0–0.2)

## 2022-03-10 LAB — PROTIME-INR
INR: 1.2 (ref 0.8–1.2)
Prothrombin Time: 15.3 seconds — ABNORMAL HIGH (ref 11.4–15.2)

## 2022-03-10 LAB — COMPREHENSIVE METABOLIC PANEL
ALT: 34 U/L (ref 0–44)
AST: 28 U/L (ref 15–41)
Albumin: 4.2 g/dL (ref 3.5–5.0)
Alkaline Phosphatase: 95 U/L (ref 38–126)
Anion gap: 9 (ref 5–15)
BUN: 24 mg/dL — ABNORMAL HIGH (ref 8–23)
CO2: 27 mmol/L (ref 22–32)
Calcium: 9.9 mg/dL (ref 8.9–10.3)
Chloride: 108 mmol/L (ref 98–111)
Creatinine, Ser: 1.46 mg/dL — ABNORMAL HIGH (ref 0.44–1.00)
GFR, Estimated: 36 mL/min — ABNORMAL LOW (ref >60)
Glucose, Bld: 131 mg/dL — ABNORMAL HIGH (ref 70–99)
Potassium: 3.9 mmol/L (ref 3.5–5.1)
Sodium: 144 mmol/L (ref 135–145)
Total Bilirubin: 0.6 mg/dL (ref 0.3–1.2)
Total Protein: 7.2 g/dL (ref 6.5–8.1)

## 2022-03-10 LAB — POCT ACTIVATED CLOTTING TIME
Activated Clotting Time: 269 seconds
Activated Clotting Time: 329 seconds

## 2022-03-10 LAB — TYPE AND SCREEN
ABO/RH(D): AB POS
Antibody Screen: NEGATIVE

## 2022-03-10 LAB — APTT: aPTT: 29 seconds (ref 24–36)

## 2022-03-10 LAB — SURGICAL PCR SCREEN
MRSA, PCR: NEGATIVE
Staphylococcus aureus: NEGATIVE

## 2022-03-10 SURGERY — ENDARTERECTOMY, CAROTID
Anesthesia: General | Site: Neck | Laterality: Left

## 2022-03-10 MED ORDER — PANTOPRAZOLE SODIUM 40 MG PO TBEC
40.0000 mg | DELAYED_RELEASE_TABLET | Freq: Every day | ORAL | Status: DC
  Administered 2022-03-11 – 2022-03-13 (×3): 40 mg via ORAL
  Filled 2022-03-10 (×3): qty 1

## 2022-03-10 MED ORDER — MAGNESIUM SULFATE 2 GM/50ML IV SOLN: 2.0000 g | Freq: Every day | INTRAVENOUS | Status: DC | PRN

## 2022-03-10 MED ORDER — DEXAMETHASONE SODIUM PHOSPHATE 10 MG/ML IJ SOLN
INTRAMUSCULAR | Status: AC
  Filled 2022-03-10: qty 1

## 2022-03-10 MED ORDER — FENTANYL CITRATE (PF) 250 MCG/5ML IJ SOLN
INTRAMUSCULAR | Status: DC | PRN
Start: 2022-03-10 — End: 2022-03-10
  Administered 2022-03-10: 100 ug via INTRAVENOUS

## 2022-03-10 MED ORDER — FENTANYL CITRATE (PF) 100 MCG/2ML IJ SOLN: 25.0000 ug | INTRAMUSCULAR | Status: DC | PRN

## 2022-03-10 MED ORDER — HEPARIN 6000 UNIT IRRIGATION SOLUTION
Status: DC | PRN
  Administered 2022-03-10: 1

## 2022-03-10 MED ORDER — LACTATED RINGERS IV SOLN: INTRAVENOUS | Status: DC

## 2022-03-10 MED ORDER — VANCOMYCIN HCL IN DEXTROSE 1-5 GM/200ML-% IV SOLN
1000.0000 mg | Freq: Once | INTRAVENOUS | Status: AC
Start: 2022-03-11 — End: 2022-03-11
  Administered 2022-03-11: 1000 mg via INTRAVENOUS
  Filled 2022-03-10: qty 200

## 2022-03-10 MED ORDER — PROTAMINE SULFATE 10 MG/ML IV SOLN
INTRAVENOUS | Status: DC | PRN
  Administered 2022-03-10 (×5): 10 mg via INTRAVENOUS

## 2022-03-10 MED ORDER — CARVEDILOL 6.25 MG PO TABS
6.2500 mg | ORAL_TABLET | Freq: Two times a day (BID) | ORAL | Status: DC
  Administered 2022-03-11 – 2022-03-13 (×5): 6.25 mg via ORAL
  Filled 2022-03-10 (×6): qty 1

## 2022-03-10 MED ORDER — PHENYLEPHRINE 80 MCG/ML (10ML) SYRINGE FOR IV PUSH (FOR BLOOD PRESSURE SUPPORT)
PREFILLED_SYRINGE | INTRAVENOUS | Status: AC
  Filled 2022-03-10: qty 10

## 2022-03-10 MED ORDER — DEXAMETHASONE SODIUM PHOSPHATE 10 MG/ML IJ SOLN
INTRAMUSCULAR | Status: DC | PRN
  Administered 2022-03-10: 4 mg via INTRAVENOUS

## 2022-03-10 MED ORDER — HYDRALAZINE HCL 20 MG/ML IJ SOLN: 5.0000 mg | INTRAMUSCULAR | Status: DC | PRN

## 2022-03-10 MED ORDER — ACETAMINOPHEN 10 MG/ML IV SOLN
1000.0000 mg | Freq: Once | INTRAVENOUS | Status: DC | PRN
Start: 2022-03-10 — End: 2022-03-10

## 2022-03-10 MED ORDER — GUAIFENESIN-DM 100-10 MG/5ML PO SYRP: 15.0000 mL | ORAL_SOLUTION | ORAL | Status: DC | PRN

## 2022-03-10 MED ORDER — POTASSIUM CHLORIDE CRYS ER 20 MEQ PO TBCR: 20.0000 meq | EXTENDED_RELEASE_TABLET | Freq: Every day | ORAL | Status: DC | PRN

## 2022-03-10 MED ORDER — LACTATED RINGERS IV SOLN: INTRAVENOUS | Status: DC | PRN

## 2022-03-10 MED ORDER — ANASTROZOLE 1 MG PO TABS
1.0000 mg | ORAL_TABLET | Freq: Every day | ORAL | Status: DC
  Administered 2022-03-11 – 2022-03-12 (×2): 1 mg via ORAL
  Filled 2022-03-10 (×4): qty 1

## 2022-03-10 MED ORDER — HYDRALAZINE HCL 25 MG PO TABS
25.0000 mg | ORAL_TABLET | Freq: Two times a day (BID) | ORAL | Status: DC
  Administered 2022-03-11 – 2022-03-13 (×5): 25 mg via ORAL
  Filled 2022-03-10 (×6): qty 1

## 2022-03-10 MED ORDER — DAPAGLIFLOZIN PROPANEDIOL 5 MG PO TABS
5.0000 mg | ORAL_TABLET | Freq: Every morning | ORAL | Status: DC
  Administered 2022-03-11 – 2022-03-13 (×3): 5 mg via ORAL
  Filled 2022-03-10 (×3): qty 1

## 2022-03-10 MED ORDER — ALUM & MAG HYDROXIDE-SIMETH 200-200-20 MG/5ML PO SUSP
15.0000 mL | ORAL | Status: DC | PRN
  Administered 2022-03-11: 30 mL via ORAL
  Filled 2022-03-10: qty 30

## 2022-03-10 MED ORDER — HEPARIN SODIUM (PORCINE) 1000 UNIT/ML IJ SOLN
INTRAMUSCULAR | Status: DC | PRN
  Administered 2022-03-10: 8000 [IU] via INTRAVENOUS

## 2022-03-10 MED ORDER — POTASSIUM CHLORIDE CRYS ER 10 MEQ PO TBCR
10.0000 meq | EXTENDED_RELEASE_TABLET | Freq: Every day | ORAL | Status: DC
Start: 2022-03-10 — End: 2022-03-13
  Administered 2022-03-11 – 2022-03-13 (×3): 10 meq via ORAL
  Filled 2022-03-10 (×3): qty 1

## 2022-03-10 MED ORDER — PROPOFOL 500 MG/50ML IV EMUL
INTRAVENOUS | Status: DC | PRN
  Administered 2022-03-10: 125 ug/kg/min via INTRAVENOUS

## 2022-03-10 MED ORDER — 0.9 % SODIUM CHLORIDE (POUR BTL) OPTIME
TOPICAL | Status: DC | PRN
  Administered 2022-03-10: 1000 mL

## 2022-03-10 MED ORDER — GLIMEPIRIDE 2 MG PO TABS
2.0000 mg | ORAL_TABLET | Freq: Every day | ORAL | Status: DC
  Administered 2022-03-11 – 2022-03-13 (×3): 2 mg via ORAL
  Filled 2022-03-10 (×3): qty 1

## 2022-03-10 MED ORDER — AMITRIPTYLINE HCL 50 MG PO TABS
25.0000 mg | ORAL_TABLET | Freq: Every day | ORAL | Status: DC
  Administered 2022-03-11 – 2022-03-12 (×2): 25 mg via ORAL
  Filled 2022-03-10 (×3): qty 1

## 2022-03-10 MED ORDER — DOCUSATE SODIUM 100 MG PO CAPS
100.0000 mg | ORAL_CAPSULE | Freq: Every day | ORAL | Status: DC
  Administered 2022-03-11 – 2022-03-13 (×3): 100 mg via ORAL
  Filled 2022-03-10 (×3): qty 1

## 2022-03-10 MED ORDER — VANCOMYCIN HCL IN DEXTROSE 1-5 GM/200ML-% IV SOLN
1000.0000 mg | INTRAVENOUS | Status: AC
  Administered 2022-03-10: 1000 mg via INTRAVENOUS
  Filled 2022-03-10: qty 200

## 2022-03-10 MED ORDER — HEPARIN SODIUM (PORCINE) 5000 UNIT/ML IJ SOLN
5000.0000 [IU] | Freq: Three times a day (TID) | INTRAMUSCULAR | Status: DC
  Administered 2022-03-11 – 2022-03-13 (×7): 5000 [IU] via SUBCUTANEOUS
  Filled 2022-03-10 (×8): qty 1

## 2022-03-10 MED ORDER — ALBUTEROL SULFATE (2.5 MG/3ML) 0.083% IN NEBU: 3.0000 mL | INHALATION_SOLUTION | RESPIRATORY_TRACT | Status: DC | PRN

## 2022-03-10 MED ORDER — ORAL CARE MOUTH RINSE
15.0000 mL | Freq: Once | OROMUCOSAL | Status: AC
Start: 2022-03-10 — End: 2022-03-10

## 2022-03-10 MED ORDER — ACETAMINOPHEN 325 MG PO TABS: 325.0000 mg | ORAL_TABLET | ORAL | Status: DC | PRN

## 2022-03-10 MED ORDER — ISOSORBIDE MONONITRATE ER 30 MG PO TB24
30.0000 mg | ORAL_TABLET | Freq: Every day | ORAL | Status: DC
  Administered 2022-03-11 – 2022-03-13 (×3): 30 mg via ORAL
  Filled 2022-03-10 (×3): qty 1

## 2022-03-10 MED ORDER — PHENOL 1.4 % MT LIQD: 1.0000 | OROMUCOSAL | Status: DC | PRN

## 2022-03-10 MED ORDER — SODIUM CHLORIDE 0.9 % IV SOLN: INTRAVENOUS | Status: DC

## 2022-03-10 MED ORDER — BISACODYL 5 MG PO TBEC: 5.0000 mg | DELAYED_RELEASE_TABLET | Freq: Every day | ORAL | Status: DC | PRN

## 2022-03-10 MED ORDER — SUGAMMADEX SODIUM 200 MG/2ML IV SOLN
INTRAVENOUS | Status: DC | PRN
  Administered 2022-03-10: 150 mg via INTRAVENOUS

## 2022-03-10 MED ORDER — PROPOFOL 10 MG/ML IV BOLUS
INTRAVENOUS | Status: DC | PRN
  Administered 2022-03-10: 120 mg via INTRAVENOUS
  Administered 2022-03-10: 30 mg via INTRAVENOUS

## 2022-03-10 MED ORDER — MECLIZINE HCL 25 MG PO TABS
25.0000 mg | ORAL_TABLET | Freq: Two times a day (BID) | ORAL | Status: DC | PRN
  Administered 2022-03-12 – 2022-03-13 (×2): 25 mg via ORAL
  Filled 2022-03-10 (×3): qty 1

## 2022-03-10 MED ORDER — PHENYLEPHRINE HCL-NACL 20-0.9 MG/250ML-% IV SOLN
INTRAVENOUS | Status: DC | PRN
  Administered 2022-03-10: 40 ug/min via INTRAVENOUS

## 2022-03-10 MED ORDER — LIDOCAINE HCL (PF) 1 % IJ SOLN
INTRAMUSCULAR | Status: AC
  Filled 2022-03-10: qty 30

## 2022-03-10 MED ORDER — ROCURONIUM BROMIDE 10 MG/ML (PF) SYRINGE
PREFILLED_SYRINGE | INTRAVENOUS | Status: AC
  Filled 2022-03-10: qty 10

## 2022-03-10 MED ORDER — CARVEDILOL 3.125 MG PO TABS
6.2500 mg | ORAL_TABLET | Freq: Once | ORAL | Status: AC
Start: 2022-03-10 — End: 2022-03-10
  Administered 2022-03-10: 6.25 mg via ORAL
  Filled 2022-03-10: qty 2

## 2022-03-10 MED ORDER — PHENYLEPHRINE 80 MCG/ML (10ML) SYRINGE FOR IV PUSH (FOR BLOOD PRESSURE SUPPORT)
PREFILLED_SYRINGE | INTRAVENOUS | Status: DC | PRN
  Administered 2022-03-10: 80 ug via INTRAVENOUS

## 2022-03-10 MED ORDER — ROCURONIUM BROMIDE 10 MG/ML (PF) SYRINGE
PREFILLED_SYRINGE | INTRAVENOUS | Status: DC | PRN
  Administered 2022-03-10: 50 mg via INTRAVENOUS
  Administered 2022-03-10: 10 mg via INTRAVENOUS

## 2022-03-10 MED ORDER — LIDOCAINE 2% (20 MG/ML) 5 ML SYRINGE
INTRAMUSCULAR | Status: AC
Start: 2022-03-10 — End: ?
  Filled 2022-03-10: qty 5

## 2022-03-10 MED ORDER — FENTANYL CITRATE (PF) 250 MCG/5ML IJ SOLN
INTRAMUSCULAR | Status: AC
  Filled 2022-03-10: qty 5

## 2022-03-10 MED ORDER — LABETALOL HCL 5 MG/ML IV SOLN: 10.0000 mg | INTRAVENOUS | Status: DC | PRN

## 2022-03-10 MED ORDER — EPHEDRINE SULFATE-NACL 50-0.9 MG/10ML-% IV SOSY
PREFILLED_SYRINGE | INTRAVENOUS | Status: DC | PRN
  Administered 2022-03-10 (×5): 5 mg via INTRAVENOUS

## 2022-03-10 MED ORDER — HYDROMORPHONE HCL 1 MG/ML IJ SOLN
0.5000 mg | INTRAMUSCULAR | Status: DC | PRN
  Administered 2022-03-10: 1 mg via INTRAVENOUS
  Filled 2022-03-10: qty 1

## 2022-03-10 MED ORDER — OXYCODONE HCL 5 MG PO TABS
5.0000 mg | ORAL_TABLET | ORAL | Status: DC | PRN
  Administered 2022-03-11 – 2022-03-13 (×4): 5 mg via ORAL
  Filled 2022-03-10 (×2): qty 2
  Filled 2022-03-10 (×2): qty 1

## 2022-03-10 MED ORDER — PROPOFOL 10 MG/ML IV BOLUS
INTRAVENOUS | Status: AC
  Filled 2022-03-10: qty 20

## 2022-03-10 MED ORDER — DIAZEPAM 5 MG PO TABS: 2.5000 mg | ORAL_TABLET | Freq: Two times a day (BID) | ORAL | Status: DC | PRN

## 2022-03-10 MED ORDER — LINAGLIPTIN 5 MG PO TABS
5.0000 mg | ORAL_TABLET | Freq: Every day | ORAL | Status: DC
  Administered 2022-03-11 – 2022-03-13 (×3): 5 mg via ORAL
  Filled 2022-03-10 (×3): qty 1

## 2022-03-10 MED ORDER — ONDANSETRON HCL 4 MG/2ML IJ SOLN
INTRAMUSCULAR | Status: DC | PRN
  Administered 2022-03-10: 4 mg via INTRAVENOUS

## 2022-03-10 MED ORDER — ONDANSETRON HCL 4 MG/2ML IJ SOLN
INTRAMUSCULAR | Status: AC
  Filled 2022-03-10: qty 2

## 2022-03-10 MED ORDER — PROTAMINE SULFATE 10 MG/ML IV SOLN
INTRAVENOUS | Status: AC
  Filled 2022-03-10: qty 5

## 2022-03-10 MED ORDER — ACETAMINOPHEN 650 MG RE SUPP: 325.0000 mg | RECTAL | Status: DC | PRN

## 2022-03-10 MED ORDER — SODIUM CHLORIDE 0.9 % IV SOLN: 500.0000 mL | Freq: Once | INTRAVENOUS | Status: DC | PRN

## 2022-03-10 MED ORDER — PREDNISOLONE ACETATE 1 % OP SUSP
1.0000 [drp] | Freq: Two times a day (BID) | OPHTHALMIC | Status: DC
  Administered 2022-03-11 – 2022-03-13 (×5): 1 [drp] via OPHTHALMIC
  Filled 2022-03-10: qty 5

## 2022-03-10 MED ORDER — HEPARIN 6000 UNIT IRRIGATION SOLUTION
Status: AC
  Filled 2022-03-10: qty 500

## 2022-03-10 MED ORDER — SODIUM CHLORIDE 0.9 % IV SOLN
0.1000 ug/kg/min | INTRAVENOUS | Status: AC
  Administered 2022-03-10: .1 ug/kg/min via INTRAVENOUS
  Filled 2022-03-10: qty 1000

## 2022-03-10 MED ORDER — CHLORHEXIDINE GLUCONATE CLOTH 2 % EX PADS: 6.0000 | MEDICATED_PAD | Freq: Once | CUTANEOUS | Status: DC

## 2022-03-10 MED ORDER — LATANOPROST 0.005 % OP SOLN
1.0000 [drp] | Freq: Every day | OPHTHALMIC | Status: DC
  Administered 2022-03-11 – 2022-03-12 (×2): 1 [drp] via OPHTHALMIC
  Filled 2022-03-10: qty 2.5

## 2022-03-10 MED ORDER — INSULIN ASPART 100 UNIT/ML IJ SOLN: 0.0000 [IU] | INTRAMUSCULAR | Status: DC | PRN

## 2022-03-10 MED ORDER — METOPROLOL TARTRATE 5 MG/5ML IV SOLN: 2.0000 mg | INTRAVENOUS | Status: DC | PRN

## 2022-03-10 MED ORDER — CHLORHEXIDINE GLUCONATE 0.12 % MT SOLN
15.0000 mL | Freq: Once | OROMUCOSAL | Status: AC
Start: 2022-03-10 — End: 2022-03-10
  Administered 2022-03-10: 15 mL via OROMUCOSAL
  Filled 2022-03-10: qty 15

## 2022-03-10 MED ORDER — ONDANSETRON HCL 4 MG/2ML IJ SOLN
4.0000 mg | Freq: Four times a day (QID) | INTRAMUSCULAR | Status: DC | PRN
  Administered 2022-03-10 – 2022-03-12 (×4): 4 mg via INTRAVENOUS
  Filled 2022-03-10 (×3): qty 2

## 2022-03-10 MED ORDER — SENNOSIDES-DOCUSATE SODIUM 8.6-50 MG PO TABS: 1.0000 | ORAL_TABLET | Freq: Every evening | ORAL | Status: DC | PRN

## 2022-03-10 MED ORDER — LIDOCAINE 2% (20 MG/ML) 5 ML SYRINGE
INTRAMUSCULAR | Status: DC | PRN
  Administered 2022-03-10: 60 mg via INTRAVENOUS

## 2022-03-10 MED ORDER — EPHEDRINE 5 MG/ML INJ
INTRAVENOUS | Status: AC
Start: 2022-03-10 — End: ?
  Filled 2022-03-10: qty 5

## 2022-03-10 MED ORDER — GABAPENTIN 300 MG PO CAPS
300.0000 mg | ORAL_CAPSULE | Freq: Three times a day (TID) | ORAL | Status: DC
  Administered 2022-03-11 – 2022-03-13 (×7): 300 mg via ORAL
  Filled 2022-03-10 (×8): qty 1

## 2022-03-10 SURGICAL SUPPLY — 42 items
APL PRP STRL LF DISP 70% ISPRP (MISCELLANEOUS) ×1
APL SKNCLS STERI-STRIP NONHPOA (GAUZE/BANDAGES/DRESSINGS) ×1
BENZOIN TINCTURE AMPULE (MISCELLANEOUS) IMPLANT
BENZOIN TINCTURE PRP APPL 2/3 (GAUZE/BANDAGES/DRESSINGS) ×1 IMPLANT
BLADE MINI RND TIP GREEN BEAV (BLADE) ×1 IMPLANT
CANISTER SUCT 3000ML PPV (MISCELLANEOUS) ×1 IMPLANT
CANNULA VESSEL 3MM 2 BLNT TIP (CANNULA) ×2 IMPLANT
CATH ROBINSON RED A/P 18FR (CATHETERS) ×1 IMPLANT
CHLORAPREP W/TINT 26 (MISCELLANEOUS) ×1 IMPLANT
CLIP LIGATING EXTRA MED SLVR (CLIP) IMPLANT
CLOSURE STERI-STRIP 1/4X4 (GAUZE/BANDAGES/DRESSINGS) IMPLANT
COVER PROBE W GEL 5X96 (DRAPES) ×1 IMPLANT
DRSG COVADERM 4X6 (GAUZE/BANDAGES/DRESSINGS) IMPLANT
ELECT REM PT RETURN 9FT ADLT (ELECTROSURGICAL) ×1
ELECTRODE REM PT RTRN 9FT ADLT (ELECTROSURGICAL) ×1 IMPLANT
GLOVE BIO SURGEON STRL SZ8 (GLOVE) ×1 IMPLANT
GOWN STRL REUS W/ TWL LRG LVL3 (GOWN DISPOSABLE) ×2 IMPLANT
GOWN STRL REUS W/ TWL XL LVL3 (GOWN DISPOSABLE) ×1 IMPLANT
GOWN STRL REUS W/TWL LRG LVL3 (GOWN DISPOSABLE) ×3
GOWN STRL REUS W/TWL XL LVL3 (GOWN DISPOSABLE) ×1
KIT BASIN OR (CUSTOM PROCEDURE TRAY) ×1 IMPLANT
KIT SHUNT ARGYLE CAROTID ART 6 (VASCULAR PRODUCTS) ×1 IMPLANT
KIT TURNOVER KIT B (KITS) ×1 IMPLANT
NS IRRIG 1000ML POUR BTL (IV SOLUTION) ×3 IMPLANT
PACK CAROTID (CUSTOM PROCEDURE TRAY) ×1 IMPLANT
PAD ARMBOARD 7.5X6 YLW CONV (MISCELLANEOUS) ×2 IMPLANT
PATCH VASC XENOSURE 1CMX6CM (Vascular Products) ×1 IMPLANT
PATCH VASC XENOSURE 1X6 (Vascular Products) IMPLANT
POSITIONER HEAD DONUT 9IN (MISCELLANEOUS) ×1 IMPLANT
STRIP CLOSURE SKIN 1/2X4 (GAUZE/BANDAGES/DRESSINGS) ×1 IMPLANT
SUT MNCRL AB 4-0 PS2 18 (SUTURE) ×1 IMPLANT
SUT PROLENE 6 0 BV (SUTURE) ×2 IMPLANT
SUT PROLENE 7 0 BV1 MDA (SUTURE) IMPLANT
SUT SILK 3 0 (SUTURE) ×1
SUT SILK 3-0 18XBRD TIE 12 (SUTURE) IMPLANT
SUT VIC AB 2-0 CT1 27 (SUTURE) ×2
SUT VIC AB 2-0 CT1 TAPERPNT 27 (SUTURE) ×1 IMPLANT
SUT VIC AB 3-0 SH 27 (SUTURE) ×1
SUT VIC AB 3-0 SH 27X BRD (SUTURE) ×1 IMPLANT
SYR CONTROL 10ML LL (SYRINGE) IMPLANT
TOWEL GREEN STERILE (TOWEL DISPOSABLE) ×1 IMPLANT
WATER STERILE IRR 1000ML POUR (IV SOLUTION) ×1 IMPLANT

## 2022-03-10 NOTE — Progress Notes (Signed)
  Day of Surgery Note    Subjective:  resting comfortably; no complaints   Vitals:   03/10/22 1145 03/10/22 1200  BP: 128/62 135/62  Pulse: 70 62  Resp: 20 13  Temp:    SpO2: 96% 96%    Incisions:   bandage in place-clean and dry Neuro:  in tact; moving all extremities equally; tongue is midline Cardiac:  regular Lungs:  non labored    Assessment/Plan:  This is a 81 y.o. female who is s/p  Left CEA  -doing well in recovery with neuro in tact. -to [place] later today.   -Anticipate discharge tomorrow if uneventful post op course.    Leontine Locket, PA-C 03/10/2022 12:46 PM [phone]

## 2022-03-10 NOTE — Transfer of Care (Signed)
Immediate Anesthesia Transfer of Care Note  Patient: [name]  Procedure(s) Performed: LEFT CAROTID ENDARTERECTOMY (Left: Neck) PATCH ANGIOPLASTY WITH 1X6CM XENOSURE PATCH (Left: Neck)  Patient Location: PACU  Anesthesia Type:General  Level of Consciousness: awake, oriented and patient cooperative  Airway & Oxygen Therapy: Patient Spontanous Breathing and Patient connected to nasal cannula oxygen  Post-op Assessment: Report given to RN and Post -op Vital signs reviewed and stable  Post vital signs: Reviewed  Last Vitals:  Vitals Value Taken Time  BP    Temp    Pulse    Resp    SpO2      Last Pain:  Vitals:   03/10/22 0714  TempSrc:   PainSc: 3       Patients Stated Pain Goal: 2 (40/97/35 3299)  Complications: No notable events documented.

## 2022-03-10 NOTE — Anesthesia Postprocedure Evaluation (Signed)
Anesthesia Post Note  Patient: [name]  Procedure(s) Performed: LEFT CAROTID ENDARTERECTOMY (Left: Neck) PATCH ANGIOPLASTY WITH 1X6CM XENOSURE PATCH (Left: Neck)     Patient location during evaluation: PACU Anesthesia Type: General Level of consciousness: awake and alert Pain management: pain level controlled Vital Signs Assessment: post-procedure vital signs reviewed and stable Respiratory status: spontaneous breathing, nonlabored ventilation, respiratory function stable and patient connected to nasal cannula oxygen Cardiovascular status: blood pressure returned to baseline and stable Postop Assessment: no apparent nausea or vomiting Anesthetic complications: no   No notable events documented.  Last Vitals:  Vitals:   03/10/22 1315 03/10/22 1326  BP: (!) 112/50 (!) 121/56  Pulse: 68 (!) 59  Resp: 13 20  Temp: 36.6 C 36.4 C  SpO2: 96% 97%    Last Pain:  Vitals:   03/10/22 1326  TempSrc: Oral  PainSc: 5                  Gretta Samons P Josafat Enrico

## 2022-03-10 NOTE — Op Note (Signed)
DATE OF SERVICE: 03/10/2022  PATIENT:  [name]  81 y.o. female  PRE-OPERATIVE DIAGNOSIS:  Asymptomatic critical left carotid artery stenosis  POST-OPERATIVE DIAGNOSIS:  Same  PROCEDURE:   Left carotid endarterectomy with bovine pericardial patch angioplasty  SURGEON:  Surgeon(s) and Role:    * Cherre Robins, MD - Primary  ASSISTANT: Gerri Lins, PA-C  An experienced assistant was required given the complexity of this procedure and the standard of surgical care. My assistant helped with exposure through counter tension, suctioning, ligation and retraction to better visualize the surgical field.  My assistant expedited sewing during the case by following my sutures. Wherever I use the term "we" in the report, my assistant actively helped me with that portion of the procedure.  ANESTHESIA:   general  EBL: 85m  BLOOD ADMINISTERED:none  DRAINS: none   LOCAL MEDICATIONS USED:  NONE  SPECIMEN:  none  COUNTS: confirmed correct.  TOURNIQUET:  none  PATIENT DISPOSITION:  PACU - hemodynamically stable.   Delay start of Pharmacological VTE agent (>24hrs) due to surgical blood loss or risk of bleeding: no  INDICATION FOR PROCEDURE: Darlene ELIASis a 81y.o. female with critical, asymptomatic left carotid artery stenosis. After careful discussion of risks, benefits, and alternatives the patient was offered left carotid endarterectomy. We specifically discussed risk of stroke, cranial nerve injury, and bleeding. The patient understood and wished to proceed.  OPERATIVE FINDINGS: Severe left carotid artery stenosis.  Vagus nerve protected.  Hypoglossal nerve not visualized.  Brisk backbleeding from internal carotid artery.  Shunt not used.  Unremarkable endarterectomy.  Both proximal and distal endpoints tacked with 7-0 Prolene.  Good backbleeding from external carotid artery.  Unremarkable patch angioplasty.  Reassuring Doppler and duplex examination at completion.  Patient  awoke neurologically intact moving all 4 extremities to command with midline tongue and clear voice.  DESCRIPTION OF PROCEDURE: After identification of the patient in the pre-operative holding area, the patient was transferred to the operating room. The patient was positioned supine on the operating room table. Anesthesia was induced. The left neck was prepped and draped in standard fashion. A surgical pause was performed confirming correct patient, procedure, and operative location.  Using intraoperative ultrasound, the course of the carotid arteries was marked on the left neck.  A curvilinear incision was made over the anterior aspect of the sternocleidomastoid.  Incision was carried down through subtenons tissue.  The platysma was encountered and divided with Bovie electrocautery.  The platysma was grasped with 2 Allis clamps and elevated.  The avascular plane over the sternocleidomastoid was developed and rolled laterally.  The carotid sheath was identified.  The internal jugular vein was skeletonized.  The facial vein was identified and encircled with a right angle clamp.  2-0 silk ligature was used proximally and distally to ligate the vein.  The vein was divided.  Immediately below the facial vein and the carotid artery was encountered.  The patient was systemically heparinized.  Activated clotting time measurements were used throughout the case to confirm adequate anticoagulation.  The carotid artery was skeletonized from the common carotid artery through its bifurcation.  The common carotid artery was encircled with umbilical tape and a Rummel tourniquet in a healthy portion proximal to the bifurcation.  The external carotid artery was identified with an early branch and encircled with a Silastic vessel loop.  Several centimeters of internal carotid artery was skeletonized and encircled with a small Silastic vessel loop.  After communication with anesthesia and  confirmation of a therapeutic activated  clotting time measurement, the carotid arteries were clamped sequentially: Internal, followed by common, followed by external.  An anterior arteriotomy was made on the anterior aspect of the common carotid artery.  A large pot scissor was used to carry the arteriotomy onto the internal carotid artery.  Severe plaque was seen extending through the bifurcation and into the internal carotid artery, as demonstrated on the preoperative CT angiogram.  A standard endarterectomy plane was developed with a Primary school teacher.  The distal endpoint feathered very nicely.  An eversion endarterectomy was performed on the external carotid artery.  The proximal endpoint had to be amputated.  Both endpoints were tacked with several interrupted sutures of 7-0 Prolene.  The endarterectomy was then carefully inspected for any loose debris under loupe magnification.  I then interrogated the endpoints with heparinized saline and found them to be without any loose flaps or debris.  Satisfied we brought a bovine pericardial patch on the field and prepared it per manufacturer's instructions.  This was sewn to the arteriotomy using continuous running suture of 6-0 Prolene.  Immediately prior to completion the angioplasty was flushed and de-aired.  Clamp was released on the external carotid artery.  The suture was secured.  2 areas of leak were repaired with interrupted suture.  Satisfied, I released the clamp on the common carotid artery.  Several beats of systole were allowed to proceed down the external carotid artery.  I then released the clamp on the internal carotid artery.  The repair was then interrogated with Doppler machine.  Good continuous diastolic flow was heard over the internal carotid artery.  A high resistance signal was heard over the external carotid artery.  A mixed signal was heard over the common carotid artery.  I then investigated with duplex ultrasound and found no debris or loose flaps in the repair.  Satisfied we  ended the case here.  Heparin was reversed with protamine.  The platysma was closed with 3-0 Vicryl.  The skin was closed with a subcuticular stitch of 4 Monocryl.  Benzoin and Steri-Strips were applied to the skin.  A clean bandage was applied to the skin.  Upon completion of the case instrument and sharps counts were confirmed correct. The patient was transferred to the PACU in good condition. I was present for all portions of the procedure.  Yevonne Aline. Stanford Breed, MD [facility] Phone Number: [phone] 03/10/2022 12:27 PM

## 2022-03-10 NOTE — Progress Notes (Signed)
Pt arrived to unit from PACU  VSS, A/O x 4,  CCMD called ,CHG given, pt oriented to unit,Will continue to monitor.   Albin Felling Bayan Hedstrom, RN    03/10/22 1326  Vitals  Temp 97.6 F (36.4 C)  Temp Source Oral  BP (!) 121/56  MAP (mmHg) 73  BP Location Right Arm  BP Method Automatic  Patient Position (if appropriate) Lying  Pulse Rate (!) 59  Pulse Rate Source Monitor  ECG Heart Rate 60  Resp 20  Level of Consciousness  Level of Consciousness Alert  Oxygen Therapy  SpO2 97 %  O2 Device Nasal Cannula  O2 Flow Rate (L/min) 2 L/min  Pain Assessment  Pain Scale 0-10  Pain Score 5  MEWS Score  MEWS Temp 0  MEWS Systolic 0  MEWS Pulse 0  MEWS RR 0  MEWS LOC 0  MEWS Score 0  MEWS Score Color Nyoka Cowden

## 2022-03-10 NOTE — Interval H&P Note (Signed)
History and Physical Interval Note:  03/10/2022 8:00 AM  [name]  has presented today for surgery, with the diagnosis of Left carotid artery stenosis.  The various methods of treatment have been discussed with the patient and family. After consideration of risks, benefits and other options for treatment, the patient has consented to  Procedure(s): LEFT CAROTID ENDARTERECTOMY (Left) as a surgical intervention.  The patient's history has been reviewed, patient examined, no change in status, stable for surgery.  I have reviewed the patient's chart and labs.  Questions were answered to the patient's satisfaction.     Cherre Robins

## 2022-03-10 NOTE — Anesthesia Procedure Notes (Signed)
Procedure Name: Intubation Date/Time: 03/10/2022 9:02 AM  Performed by: Jenne Campus, CRNAPre-anesthesia Checklist: Patient identified, Emergency Drugs available, Suction available and Patient being monitored Patient Re-evaluated:Patient Re-evaluated prior to induction Oxygen Delivery Method: Circle System Utilized Preoxygenation: Pre-oxygenation with 100% oxygen Induction Type: IV induction Ventilation: Mask ventilation without difficulty Laryngoscope Size: Miller and 2 Grade View: Grade I Tube type: Oral Tube size: 7.0 mm Number of attempts: 1 Airway Equipment and Method: Stylet and Oral airway Placement Confirmation: ETT inserted through vocal cords under direct vision, positive ETCO2 and breath sounds checked- equal and bilateral Secured at: 21 cm Tube secured with: Tape Dental Injury: Teeth and Oropharynx as per pre-operative assessment

## 2022-03-10 NOTE — Anesthesia Procedure Notes (Signed)
Arterial Line Insertion Start/End9/12/2021 8:15 AM, 03/10/2022 8:25 AM Performed by: Jenne Campus, CRNA, CRNA  Patient location: Pre-op. Preanesthetic checklist: patient identified, IV checked, site marked, risks and benefits discussed, monitors and equipment checked, pre-op evaluation and timeout performed Lidocaine 1% used for infiltration Right was placed Catheter size: 20 G Hand hygiene performed  and maximum sterile barriers used   Attempts: 2 Procedure performed without using ultrasound guided technique. Following insertion, dressing applied and Biopatch. Post procedure assessment: normal  Patient tolerated the procedure well with no immediate complications.

## 2022-03-11 ENCOUNTER — Encounter (HOSPITAL_COMMUNITY): Payer: Self-pay | Admitting: Vascular Surgery

## 2022-03-11 LAB — BASIC METABOLIC PANEL
Anion gap: 9 (ref 5–15)
BUN: 19 mg/dL (ref 8–23)
CO2: 24 mmol/L (ref 22–32)
Calcium: 8.9 mg/dL (ref 8.9–10.3)
Chloride: 109 mmol/L (ref 98–111)
Creatinine, Ser: 0.88 mg/dL (ref 0.44–1.00)
GFR, Estimated: >60 mL/min (ref >60)
Glucose, Bld: 105 mg/dL — ABNORMAL HIGH (ref 70–99)
Potassium: 3.8 mmol/L (ref 3.5–5.1)
Sodium: 142 mmol/L (ref 135–145)

## 2022-03-11 LAB — CBC
HCT: 40.3 % (ref 36.0–46.0)
Hemoglobin: 13.1 g/dL (ref 12.0–15.0)
MCH: 30.3 pg (ref 26.0–34.0)
MCHC: 32.5 g/dL (ref 30.0–36.0)
MCV: 93.1 fL (ref 80.0–100.0)
Platelets: 179 10*3/uL (ref 150–400)
RBC: 4.33 MIL/uL (ref 3.87–5.11)
RDW: 13 % (ref 11.5–15.5)
WBC: 7.4 10*3/uL (ref 4.0–10.5)
nRBC: 0 % (ref 0.0–0.2)

## 2022-03-11 LAB — GLUCOSE, CAPILLARY
Glucose-Capillary: 108 mg/dL — ABNORMAL HIGH (ref 70–99)
Glucose-Capillary: 85 mg/dL (ref 70–99)

## 2022-03-11 LAB — LIPID PANEL
Cholesterol: 139 mg/dL (ref 0–200)
HDL: 44 mg/dL (ref >40)
LDL Cholesterol: 63 mg/dL (ref 0–99)
Total CHOL/HDL Ratio: 3.2 RATIO
Triglycerides: 161 mg/dL — ABNORMAL HIGH (ref <150)
VLDL: 32 mg/dL (ref 0–40)

## 2022-03-11 LAB — PROTIME-INR
INR: 1.2 (ref 0.8–1.2)
Prothrombin Time: 15.4 seconds — ABNORMAL HIGH (ref 11.4–15.2)

## 2022-03-11 MED ORDER — WARFARIN - PHARMACIST DOSING INPATIENT: Freq: Every day | Status: DC

## 2022-03-11 MED ORDER — WARFARIN SODIUM 2 MG PO TABS
3.0000 mg | ORAL_TABLET | Freq: Once | ORAL | Status: AC
  Administered 2022-03-11: 3 mg via ORAL
  Filled 2022-03-11: qty 1

## 2022-03-11 NOTE — Progress Notes (Signed)
Mobility Specialist Progress Note:   03/11/22 0943  Mobility  Activity Ambulated with assistance in hallway  Level of Assistance Minimal assist, patient does 75% or more  Assistive Device Front wheel walker  Distance Ambulated (ft) 550 ft  Activity Response Tolerated well  $Mobility charge 1 Mobility   Pt received in bed willing to participate in mobility. No complaints of pain. MinA to stand. Left in chair with call bell in reach and all needs met.  [facility] Mobility Specialist

## 2022-03-11 NOTE — Progress Notes (Signed)
ANTICOAGULATION CONSULT NOTE - Initial Consult  Pharmacy Consult for warfarin Indication:  Hx VTE  Allergies  Allergen Reactions   Alphagan [Brimonidine] Itching   Diflunisal Swelling    Other reaction(s): ENTIRE BODY SWELLING   Vioxx [Rofecoxib] Shortness Of Breath   Metformin And Related     Kidney failure   Nexlizet [Bempedoic Acid-Ezetimibe]     Causes elevated Liver and Kidney function   Repatha [Evolocumab]     MYALGIAS   Codeine Rash   Elemental Sulfur Rash   Motrin [Ibuprofen] Rash   Penicillins Rash   Pravastatin Rash    Patient Measurements: Height: '5\' 2"'$  (157.5 cm) Weight: 73.9 kg (163 lb) IBW/kg (Calculated) : 50.1  Vital Signs: Temp: 97.9 F (36.6 C) (09/07 0600) Temp Source: Oral (09/07 0600) BP: 124/45 (09/07 1200) Pulse Rate: 63 (09/07 1200)  Labs: Recent Labs    03/09/22 1044 03/10/22 0659 03/11/22 0650  HGB  --  15.7* 13.1  HCT  --  49.0* 40.3  PLT  --  224 179  APTT  --  29  --   LABPROT  --  15.3* 15.4*  INR 1.7* 1.2 1.2  CREATININE  --  1.46* 0.88    Estimated Creatinine Clearance: 47.2 mL/min (by C-G formula based on SCr of 0.88 mg/dL).   Medical History: Past Medical History:  Diagnosis Date   Antral gastritis    EGD 11/15   Asthmatic bronchitis    Back pain    Breast cancer ([place])    right breast   CAD in native artery 03/10/2021   Chronic diastolic heart failure ([place]) 05/20/2015   Grade 2 diastolic dysfunction.  04/2015.   Chronic kidney disease    kidney function low   Diabetes mellitus    x 5 yrs   DVT of axillary vein, acute left (HCC) 07/24/2012   GERD (gastroesophageal reflux disease)    Glaucoma    POAG OU   Heart murmur    History of hiatal hernia    History of kidney stones    Hyperlipidemia 05/20/2015   Hypertension    Hypertensive retinopathy    OU   Hypothyroidism    Kidney stones    Macular degeneration    Wet OD, Dry OS   Mixed hyperlipidemia    OSA (obstructive sleep apnea) 12/17/2021    Peripheral venous insufficiency    Pinched nerve    right elbow   Pneumonia    PONV (postoperative nausea and vomiting)    Sigmoid diverticulitis    Snoring 03/10/2021   Vertigo    chonic    Medications:  Scheduled:   amitriptyline  25 mg Oral QHS   anastrozole  1 mg Oral QHS   carvedilol  6.25 mg Oral BID   dapagliflozin propanediol  5 mg Oral q morning   docusate sodium  100 mg Oral Daily   gabapentin  300 mg Oral TID   glimepiride  2 mg Oral Q breakfast   heparin  5,000 Units Subcutaneous Q8H   hydrALAZINE  25 mg Oral BID AC & HS   isosorbide mononitrate  30 mg Oral Daily   latanoprost  1 drop Left Eye QHS   linagliptin  5 mg Oral Daily   pantoprazole  40 mg Oral Daily   potassium chloride SA  10 mEq Oral Daily   prednisoLONE acetate  1 drop Right Eye BID   warfarin  3 mg Oral ONCE-1600   Warfarin - Pharmacist Dosing Inpatient   Does not  apply q1600    Assessment: 52 yof now s/p L CEA on 9/6. Was on warfarin PTA for hx VTE - PTA regimen is warfarin 2 mg daily (LD 9/2).  INR is subtherapeutic at 1.2. CBC stable - Hgb 13, plt 179. On subQ heparin for DVT prophylaxis - no need for therapeutic bridge per vascular, will continue heparin subQ until therapeutic and while in the hospital.  Goal of Therapy:  INR 2-3 Monitor platelets by anticoagulation protocol: Yes   Plan:  Warfarin 3 mg tonight Plan to continue warfarin at PTA dosing at discharge Monitor daily INR, CBC, and for s/sx of bleeding  Antonietta Jewel, PharmD, BCCCP Clinical Pharmacist  Phone: [phone] 03/11/2022 3:50 PM  Please check AMION for all [place] phone numbers After 10:00 PM, call [place] [phone]

## 2022-03-11 NOTE — Progress Notes (Signed)
Patient has vomited x3 and wanted to just rest at this time. The patient has not received any night time meds for fear she will not be able to keep anything down and the vomiting is causing her pain to increase.

## 2022-03-11 NOTE — Progress Notes (Addendum)
  Progress Note    03/11/2022 8:15 AM 1 Day Post-Op  Subjective:  overall she says she was having a rough night, vomited following her dinner 3x. Feeling a little better today   Vitals:   03/11/22 0426 03/11/22 0600  BP: (!) 121/50 (!) 126/53  Pulse: (!) 53 (!) 54  Resp: 17   Temp: 97.9 F (36.6 C) 97.9 F (36.6 C)  SpO2: 98% 98%   Physical Exam: Cardiac:  regular Lungs:  non labored Incisions:  Left neck incision dressings are clean, dry and intact. There is fullness in her left supraclavicular area however very similar to right side and it is soft so do not think any hematoma or swelling Extremities:  moving all extremities without deficits Neurologic: alert and oriented. CN intact. Smile symmetric. Speech coherent. Tongue midline  CBC    Component Value Date/Time   WBC 7.4 03/11/2022 0650   RBC 4.33 03/11/2022 0650   HGB 13.1 03/11/2022 0650   HGB 12.3 05/20/2015 1406   HCT 40.3 03/11/2022 0650   HCT 36.7 05/20/2015 1406   PLT 179 03/11/2022 0650   PLT 271 05/20/2015 1406   MCV 93.1 03/11/2022 0650   MCV 87 05/20/2015 1406   MCH 30.3 03/11/2022 0650   MCHC 32.5 03/11/2022 0650   RDW 13.0 03/11/2022 0650   RDW 14.1 05/20/2015 1406   LYMPHSABS 2.3 04/17/2015 1010   MONOABS 0.4 05/03/2014 0852   EOSABS 0.1 04/17/2015 1010   BASOSABS 0.0 04/17/2015 1010    BMET    Component Value Date/Time   NA 144 03/10/2022 0659   NA 144 08/20/2021 1120   K 3.9 03/10/2022 0659   CL 108 03/10/2022 0659   CO2 27 03/10/2022 0659   GLUCOSE 131 (H) 03/10/2022 0659   BUN 24 (H) 03/10/2022 0659   BUN 21 08/20/2021 1120   CREATININE 1.46 (H) 03/10/2022 0659   CREATININE 1.12 (H) 08/17/2016 0914   CALCIUM 9.9 03/10/2022 0659   GFRNONAA 36 (L) 03/10/2022 0659   GFRAA 45 (L) 10/05/2019 0859    INR    Component Value Date/Time   INR 1.2 03/11/2022 0650     Intake/Output Summary (Last 24 hours) at 03/11/2022 0815 Last data filed at 03/11/2022 0333 Gross per 24 hour  Intake  1791.25 ml  Output 100 ml  Net 1691.25 ml     Assessment/Plan:  81 y.o. female is s/p Left CEA 1 Day Post-Op   Neurologically intact Some incisional pain Vomited x 3 last night. Nausea somewhat better this morning Hemodynamically stable Mobilize as tolerated Will see how she does today, possible discharge home later today vs tomorrow  Karoline Caldwell, [place] [facility] [phone] 03/11/2022 8:15 AM  VASCULAR STAFF ADDENDUM: I have independently interviewed and examined the patient. I agree with the above.  Nauseated, but otherwise doing well POD#1 after L CEA for critical asymptomatic disease. Neurologic exam is normal. Neck is soft. Mobilize as able. Diet as tolerated. DC this afternoon vs. Tomorrow pending nausea and diet tolerance.   Yevonne Aline. Stanford Breed, MD [facility] Phone Number: [phone] 03/11/2022 11:15 AM

## 2022-03-11 NOTE — Discharge Instructions (Addendum)
[facility]  Discharge Instructions   Carotid Surgery  Please refer to the following instructions for your post-procedure care. Your surgeon or physician assistant will discuss any changes with you.  Activity  You are encouraged to walk as much as you can. You can slowly return to normal activities but must avoid strenuous activity and heavy lifting until your doctor tell you it's okay. Avoid activities such as vacuuming or swinging a golf club. You can drive after one week if you are comfortable and you are no longer taking prescription pain medications. It is normal to feel tired for serval weeks after your surgery. It is also normal to have difficulty with sleep habits, eating, and bowel movements after surgery. These will go away with time.  Bathing/Showering  Shower daily after you go home. Do not soak in a bathtub, hot tub, or swim until the incision heals completely.  Incision Care  Shower every day. Clean your incision with mild soap and water. Pat the area dry with a clean towel. You do not need a bandage unless otherwise instructed. Do not apply any ointments or creams to your incision. You may have skin glue on your incision. Do not peel it off. It will come off on its own in about one week. Your incision may feel thickened and raised for several weeks after your surgery. This is normal and the skin will soften over time.   For Men Only: It's okay to shave around the incision but do not shave the incision itself for 2 weeks. It is common to have numbness under your chin that could last for several months.  Diet  Resume your normal diet. There are no special food restrictions following this procedure. A low fat/low cholesterol diet is recommended for all patients with vascular disease. In order to heal from your surgery, it is CRITICAL to get adequate nutrition. Your body requires vitamins, minerals, and protein. Vegetables are the best source of  vitamins and minerals. Vegetables also provide the perfect balance of protein. Processed food has little nutritional value, so try to avoid this.  Medications  Resume taking all of your medications unless your doctor or physician assistant tells you not to. If your incision is causing pain, you may take over-the- counter pain relievers such as acetaminophen (Tylenol). If you were prescribed a stronger pain medication, please be aware these medications can cause nausea and constipation. Prevent nausea by taking the medication with a snack or meal. Avoid constipation by drinking plenty of fluids and eating foods with a high amount of fiber, such as fruits, vegetables, and grains.  Do not take Tylenol if you are taking prescription pain medications.  Follow Up  Our office will schedule a follow up appointment 2-3 weeks following discharge.  Please call us immediately for any of the following conditions  Increased pain, redness, drainage (pus) from your incision site. Fever of 101 degrees or higher. If you should develop stroke (slurred speech, difficulty swallowing, weakness on one side of your body, loss of vision) you should call 911 and go to the nearest emergency room.  Reduce your risk of vascular disease:  Stop smoking. If you would like help call [facility] at 1-800-QUIT-NOW [phone]) or [place] at [phone]. Manage your cholesterol Maintain a desired weight Control your diabetes Keep your blood pressure down  If you have any questions, please call the office at [phone].   Please call lipid clinic at [facility] to discuss increasing dose  of Praluent to better control your cholesterol.

## 2022-03-11 NOTE — Progress Notes (Signed)
PHARMACIST LIPID MONITORING  [name] is a 81 y.o. female admitted on 03/10/2022 with left carotid artery stenosis.  Pharmacy has been consulted to optimize lipid-lowering therapy with the indication of secondary prevention for clinical ASCVD.  Recent Labs:  Lipid Panel (last 6 months):   Lab Results  Component Value Date   CHOL 139 03/11/2022   TRIG 161 (H) 03/11/2022   HDL 44 03/11/2022   CHOLHDL 3.2 03/11/2022   VLDL 32 03/11/2022   LDLCALC 63 03/11/2022   Hepatic function panel (last 6 months):   Lab Results  Component Value Date   AST 28 03/10/2022   ALT 34 03/10/2022   ALKPHOS 95 03/10/2022   BILITOT 0.6 03/10/2022   SCr (since admission):   Serum creatinine: 0.88 mg/dL 03/11/22 0650 Estimated creatinine clearance: 47.2 mL/min  Current therapy and lipid therapy tolerance Current lipid-lowering therapy: Praluent 75 mg every 14 days  Previous lipid-lowering therapies: pravastatin, rosuvastatin, Repatha, Nexlizet  Documented or reported allergies or intolerances to lipid-lowering therapies: rash, myalgias, transaminitis with statins; reported bronchitis flare with Repatha, elevated LFTs with Nexlizet   Assessment:   Patient is currently tolerating Praluent well. LDL of 68 improved from 148 prior to Praluent start in 2021. LDL goal <55 mg/dL considering progressive ASCVD.   Plan:   Recommend patient schedule follow up with lipid clinic to discuss increasing Praluent to 150 mg every 14 days to help lower LDL to goal.   Refer to lipid clinic: patient already established with lipid clinic  Rebbeca Paul, PharmD Clinical Pharmacist 03/11/2022 9:18 AM

## 2022-03-12 LAB — CBC
HCT: 41.7 % (ref 36.0–46.0)
Hemoglobin: 13.6 g/dL (ref 12.0–15.0)
MCH: 30.6 pg (ref 26.0–34.0)
MCHC: 32.6 g/dL (ref 30.0–36.0)
MCV: 93.9 fL (ref 80.0–100.0)
Platelets: 161 10*3/uL (ref 150–400)
RBC: 4.44 MIL/uL (ref 3.87–5.11)
RDW: 13.2 % (ref 11.5–15.5)
WBC: 6.8 10*3/uL (ref 4.0–10.5)
nRBC: 0 % (ref 0.0–0.2)

## 2022-03-12 LAB — PROTIME-INR
INR: 1.2 (ref 0.8–1.2)
Prothrombin Time: 14.8 seconds (ref 11.4–15.2)

## 2022-03-12 MED ORDER — WARFARIN SODIUM 2 MG PO TABS
2.0000 mg | ORAL_TABLET | Freq: Every day | ORAL | Status: DC
  Administered 2022-03-12: 2 mg via ORAL
  Filled 2022-03-12: qty 1

## 2022-03-12 MED ORDER — WARFARIN SODIUM 1 MG PO TABS: 3.0000 mg | ORAL_TABLET | Freq: Once | ORAL | Status: DC

## 2022-03-12 NOTE — Progress Notes (Addendum)
[place] for warfarin Indication:  Hx VTE  Allergies  Allergen Reactions   Alphagan [Brimonidine] Itching   Diflunisal Swelling    Other reaction(s): ENTIRE BODY SWELLING   Vioxx [Rofecoxib] Shortness Of Breath   Metformin And Related     Kidney failure   Nexlizet [Bempedoic Acid-Ezetimibe]     Causes elevated Liver and Kidney function   Repatha [Evolocumab]     MYALGIAS   Codeine Rash   Elemental Sulfur Rash   Motrin [Ibuprofen] Rash   Penicillins Rash   Pravastatin Rash    Patient Measurements: Height: '5\' 2"'$  (157.5 cm) Weight: 73.9 kg (163 lb) IBW/kg (Calculated) : 50.1  Vital Signs: Temp: 98.4 F (36.9 C) (09/08 0847) Temp Source: Oral (09/08 0847) BP: 144/82 (09/08 0847) Pulse Rate: 81 (09/08 0847)  Labs: Recent Labs    03/10/22 0659 03/11/22 0650 03/12/22 0617  HGB 15.7* 13.1 13.6  HCT 49.0* 40.3 41.7  PLT 224 179 161  APTT 29  --   --   LABPROT 15.3* 15.4* 14.8  INR 1.2 1.2 1.2  CREATININE 1.46* 0.88  --      Estimated Creatinine Clearance: 47.2 mL/min (by C-G formula based on SCr of 0.88 mg/dL).   Medical History: Past Medical History:  Diagnosis Date   Antral gastritis    EGD 11/15   Asthmatic bronchitis    Back pain    Breast cancer ([place])    right breast   CAD in native artery 03/10/2021   Chronic diastolic heart failure ([place]) 05/20/2015   Grade 2 diastolic dysfunction.  04/2015.   Chronic kidney disease    kidney function low   Diabetes mellitus    x 5 yrs   DVT of axillary vein, acute left (HCC) 07/24/2012   GERD (gastroesophageal reflux disease)    Glaucoma    POAG OU   Heart murmur    History of hiatal hernia    History of kidney stones    Hyperlipidemia 05/20/2015   Hypertension    Hypertensive retinopathy    OU   Hypothyroidism    Kidney stones    Macular degeneration    Wet OD, Dry OS   Mixed hyperlipidemia    OSA (obstructive sleep apnea) 12/17/2021   Peripheral venous  insufficiency    Pinched nerve    right elbow   Pneumonia    PONV (postoperative nausea and vomiting)    Sigmoid diverticulitis    Snoring 03/10/2021   Vertigo    chonic    Medications:  Scheduled:   amitriptyline  25 mg Oral QHS   anastrozole  1 mg Oral QHS   carvedilol  6.25 mg Oral BID   dapagliflozin propanediol  5 mg Oral q morning   docusate sodium  100 mg Oral Daily   gabapentin  300 mg Oral TID   glimepiride  2 mg Oral Q breakfast   heparin  5,000 Units Subcutaneous Q8H   hydrALAZINE  25 mg Oral BID AC & HS   isosorbide mononitrate  30 mg Oral Daily   latanoprost  1 drop Left Eye QHS   linagliptin  5 mg Oral Daily   pantoprazole  40 mg Oral Daily   potassium chloride SA  10 mEq Oral Daily   prednisoLONE acetate  1 drop Right Eye BID   Warfarin - Pharmacist Dosing Inpatient   Does not apply q1600    Assessment: 89 yof now s/p L CEA on 9/6. Was on warfarin PTA  for hx VTE - PTA regimen is warfarin 2 mg daily (LD 9/2).  INR remains subtherapeutic at 1.2 - warfarin restarted last night (gave boosted dose last night). CBC stable - Hgb 13, plt 161. On subQ heparin for DVT prophylaxis - no need for therapeutic bridge per vascular, will continue heparin subQ until therapeutic and while in the hospital.  Goal of Therapy:  INR 2-3 Monitor platelets by anticoagulation protocol: Yes   Plan:  Warfarin 2 mg tonight  Would recommend continuing warfarin at PTA dosing of 2 mg daily at discharge  Monitor daily INR, CBC, and for s/sx of bleeding  Antonietta Jewel, PharmD, BCCCP Clinical Pharmacist  Phone: [phone] 03/12/2022 9:32 AM  Please check AMION for all [place] phone numbers After 10:00 PM, call [place] [phone]

## 2022-03-12 NOTE — Evaluation (Signed)
Physical Therapy Evaluation Patient Details Name: [name] MRN: [id number] DOB: June 28, 1941 Today's Date: 03/12/2022  History of Present Illness  81 yo female admitted 9/6 for left CEA. PMhx: CAD, CHF, DM, glaucoma, GERD, HTN, HLD, vertigo  Clinical Impression  Pt very pleasant and reports living alone and enjoying gardening and birds. Pt does endorse several episodes of dizziness at home and throughout session with pt reporting 3 falls this year. PT with decreased strength, safety and balance who will benefit from acute therapy to maximize mobility and safety prior to D/C. Encouraged pt to have initial assist at D/C for safety and pt agreeable to RW for balance.  HR 73-84 SPO2 97% on RA        Recommendations for follow up therapy are one component of a multi-disciplinary discharge planning process, led by the attending physician.  Recommendations may be updated based on patient status, additional functional criteria and insurance authorization.  Follow Up Recommendations Home health PT      Assistance Recommended at Discharge Intermittent Supervision/Assistance  Patient can return home with the following  A little help with bathing/dressing/bathroom;Assistance with cooking/housework;A little help with walking and/or transfers    Equipment Recommendations Rolling walker (2 wheels)  Recommendations for Other Services  OT consult    Functional Status Assessment Patient has had a recent decline in their functional status and demonstrates the ability to make significant improvements in function in a reasonable and predictable amount of time.     Precautions / Restrictions Precautions Precautions: Fall Restrictions Weight Bearing Restrictions: No      Mobility  Bed Mobility Overal bed mobility: Needs Assistance Bed Mobility: Rolling, Sidelying to Sit Rolling: Min guard Sidelying to sit: Min assist       General bed mobility comments: Pt with physical assist to roll and  rise from left side without straining    Transfers Overall transfer level: Needs assistance   Transfers: Sit to/from Stand Sit to Stand: Supervision           General transfer comment: supervision from bed and toilet    Ambulation/Gait Ambulation/Gait assistance: Min guard Gait Distance (Feet): 450 Feet Assistive device: Rolling walker (2 wheels) Gait Pattern/deviations: Step-through pattern, Decreased stride length, Trunk flexed   Gait velocity interpretation: >2.62 ft/sec, indicative of community ambulatory   General Gait Details: guarding with 3 partial LOB with dizziness and guarding to recover with standing rest grossly 5 sec each episode  Stairs Stairs: Yes Stairs assistance: Min guard Stair Management: Step to pattern, Forwards, One rail Right, Two rails Number of Stairs: 4 General stair comments: ascend with rt rail, descend with bil rails  Wheelchair Mobility    Modified Rankin (Stroke Patients Only)       Balance Overall balance assessment: Needs assistance, History of Falls   Sitting balance-Leahy Scale: Good     Standing balance support: Bilateral upper extremity supported Standing balance-Leahy Scale: Poor Standing balance comment: RW for gait, episodes of dizziness, 3 falls in last year                             Pertinent Vitals/Pain Pain Assessment Pain Assessment: 0-10 Pain Score: 6  Pain Location: back Pain Descriptors / Indicators: Aching, Guarding, Spasm Pain Intervention(s): Limited activity within patient's tolerance, Monitored during session, Repositioned    Home Living Family/patient expects to be discharged to:: Private residence Living Arrangements: Alone Available Help at Discharge: Family;Available PRN/intermittently Type of Home: [place]  Access: Stairs to enter Entrance Stairs-Rails: Chemical engineer of Steps: 4   Home Layout: One level Home Equipment: None      Prior Function Prior  Level of Function : Independent/Modified Independent                     Hand Dominance        Extremity/Trunk Assessment   Upper Extremity Assessment Upper Extremity Assessment: Generalized weakness    Lower Extremity Assessment Lower Extremity Assessment: Generalized weakness    Cervical / Trunk Assessment Cervical / Trunk Assessment: Kyphotic  Communication   Communication: No difficulties  Cognition Arousal/Alertness: Awake/alert Behavior During Therapy: WFL for tasks assessed/performed Overall Cognitive Status: Impaired/Different from baseline Area of Impairment: Safety/judgement                         Safety/Judgement: Decreased awareness of deficits              General Comments      Exercises     Assessment/Plan    PT Assessment Patient needs continued PT services  PT Problem List Decreased strength;Decreased mobility;Decreased activity tolerance;Decreased balance;Decreased knowledge of use of DME;Decreased safety awareness       PT Treatment Interventions Gait training;DME instruction;Therapeutic exercise;Functional mobility training;Therapeutic activities;Patient/family education;Stair training    PT Goals (Current goals can be found in the Care Plan section)  Acute Rehab PT Goals Patient Stated Goal: get home to my flowers and birds PT Goal Formulation: With patient Time For Goal Achievement: 03/26/22 Potential to Achieve Goals: Good    Frequency Min 3X/week     Co-evaluation               AM-PAC PT "6 Clicks" Mobility  Outcome Measure Help needed turning from your back to your side while in a flat bed without using bedrails?: A Little Help needed moving from lying on your back to sitting on the side of a flat bed without using bedrails?: A Little Help needed moving to and from a bed to a chair (including a wheelchair)?: A Little Help needed standing up from a chair using your arms (e.g., wheelchair or bedside  chair)?: A Little Help needed to walk in hospital room?: A Little Help needed climbing 3-5 steps with a railing? : A Little 6 Click Score: 18    End of Session   Activity Tolerance: Patient tolerated treatment well Patient left: in chair;with call bell/phone within reach Nurse Communication: Mobility status PT Visit Diagnosis: Other abnormalities of gait and mobility (R26.89);Difficulty in walking, not elsewhere classified (R26.2)    Time: 0973-5329 PT Time Calculation (min) (ACUTE ONLY): 31 min   Charges:   PT Evaluation $PT Eval Moderate Complexity: 1 Mod PT Treatments $Gait Training: 8-22 mins        Bayard Males, PT Acute Rehabilitation Services Office: [phone]   Sandy Salaam Wyat Infinger 03/12/2022, 12:44 PM

## 2022-03-12 NOTE — Progress Notes (Addendum)
  Progress Note    03/12/2022 7:42 AM 2 Days Post-Op  Subjective:  says she slept well last night. Mild nausea yesterday evening. Has been tolerating diet. Has ambulated in room   Vitals:   03/12/22 0350 03/12/22 0426  BP: 138/61 138/61  Pulse: 64 64  Resp: 17 17  Temp: 98 F (36.7 C) 98 F (36.7 C)  SpO2: 100% 100%   Physical Exam: Cardiac:  regular Lungs:  non labored Incisions:  left neck incision c/d/I. No swelling or hematoma Extremities:  moving all extremities without deficits Neurologic: alert and oriented. speech coherent. Smile symmetric. Tongue midline  CBC    Component Value Date/Time   WBC 7.4 03/11/2022 0650   RBC 4.33 03/11/2022 0650   HGB 13.1 03/11/2022 0650   HGB 12.3 05/20/2015 1406   HCT 40.3 03/11/2022 0650   HCT 36.7 05/20/2015 1406   PLT 179 03/11/2022 0650   PLT 271 05/20/2015 1406   MCV 93.1 03/11/2022 0650   MCV 87 05/20/2015 1406   MCH 30.3 03/11/2022 0650   MCHC 32.5 03/11/2022 0650   RDW 13.0 03/11/2022 0650   RDW 14.1 05/20/2015 1406   LYMPHSABS 2.3 04/17/2015 1010   MONOABS 0.4 05/03/2014 0852   EOSABS 0.1 04/17/2015 1010   BASOSABS 0.0 04/17/2015 1010    BMET    Component Value Date/Time   NA 142 03/11/2022 0650   NA 144 08/20/2021 1120   K 3.8 03/11/2022 0650   CL 109 03/11/2022 0650   CO2 24 03/11/2022 0650   GLUCOSE 105 (H) 03/11/2022 0650   BUN 19 03/11/2022 0650   BUN 21 08/20/2021 1120   CREATININE 0.88 03/11/2022 0650   CREATININE 1.12 (H) 08/17/2016 0914   CALCIUM 8.9 03/11/2022 0650   GFRNONAA >60 03/11/2022 0650   GFRAA 45 (L) 10/05/2019 0859    INR    Component Value Date/Time   INR 1.2 03/11/2022 0650     Intake/Output Summary (Last 24 hours) at 03/12/2022 0742 Last data filed at 03/11/2022 2000 Gross per 24 hour  Intake 50 ml  Output --  Net 50 ml     Assessment/Plan:  81 y.o. female is s/p left CEA 2 Days Post-Op   Remains neurologically intact Pain well controlled Nausea  better Tolerating diet She lives alone so she would like to see how she feels this morning before d/c Encourage her to mobilize today Likely discharge later this afternoon  She will have f/u arranged in 2-3 weeks for incision check   Karoline Caldwell, PA-C [facility] [phone] 03/12/2022 7:42 AM  VASCULAR STAFF ADDENDUM: I have independently interviewed and examined the patient. I agree with the above.  Continued nausea today. She reports some recent falls at home. She lives independently. Will ask PT to see.  Yevonne Aline. Stanford Breed, MD [facility] Phone Number: [phone] 03/12/2022 12:46 PM

## 2022-03-12 NOTE — TOC Initial Note (Signed)
Transition of Care (TOC) - Initial/Assessment Note  Marvetta Gibbons RN, BSN Transitions of Care Unit 4E- RN Case Manager See Treatment Team for direct phone #    Patient Details  Name: [name] MRN: [id number] Date of Birth: 1941-05-07  Transition of Care [facility] CM/SW Contact:    Dawayne Patricia, RN Phone Number: 03/12/2022, 4:52 PM  Clinical Narrative:                 HH and DME orders placed. Pt from home alone s/p CEA.  CM in to speak with pt at bedside- discussed [place] and DME needs.  Per pt she used to have RW and BSC but gave them away years ago- will need RW for home.  Choice given for [facility] needs- discussed [facility] that works with Vascular office a lot under office protocols - pt agreeable to use them for [facility] needs- Per Lattie Haw with [facility] they can accept referral for needed services and pt is agreeable to this plan as she states she does not have a preference for [facility] agency.   Pt voiced she plans to go stay with friend at [facility] prior to return home Address for Friend is [employer] # confirmed with pt in epic.   Call made to [employer] for DME- RW referral- RW to be delivered to room prior to discharge.   Expected Discharge Plan: [place] Barriers to Discharge: Continued Medical Work up   Patient Goals and CMS Choice Patient states their goals for this hospitalization and ongoing recovery are:: return home after getting stronger- plan to stay with friend at first CMS Medicare.gov Compare Post Acute Care list provided to:: Patient Choice offered to / list presented to : Patient  Expected Discharge Plan and Services Expected Discharge Plan: [place]   Discharge Planning Services: CM Consult Post Acute Care Choice: Home Health, Durable Medical Equipment Living arrangements for the past 2 months: Single Family Home                 DME Arranged: Walker rolling DME Agency: [facility] Date DME Agency Contacted:  03/12/22 Time DME Agency Contacted: [phone] Representative spoke with at DME Agency: [place]: PT [place]: [place] Date [place]: 03/12/22 Time [place]: [place] Representative spoke with at [place]: [place] Arrangements/Services Living arrangements for the past 2 months: Single Family Home Lives with:: Self Patient language and need for interpreter reviewed:: Yes Do you feel safe going back to the place where you live?: Yes      Need for Family Participation in Patient Care: Yes (Comment) Care giver support system in place?: Yes (comment)   Criminal Activity/Legal Involvement Pertinent to Current Situation/Hospitalization: No - Comment as needed  Activities of Daily Living Home Assistive Devices/Equipment: Eyeglasses, Dentures (specify type), Blood pressure cuff, CBG Meter ADL Screening (condition at time of admission) Patient's cognitive ability adequate to safely complete daily activities?: Yes Is the patient deaf or have difficulty hearing?: No Does the patient have difficulty seeing, even when wearing glasses/contacts?: No Does the patient have difficulty concentrating, remembering, or making decisions?: No Patient able to express need for assistance with ADLs?: Yes Does the patient have difficulty dressing or bathing?: No Independently performs ADLs?: Yes (appropriate for developmental age) Does the patient have difficulty walking or climbing stairs?: Yes (back pain) Weakness of Legs: None Weakness of Arms/Hands: None  Permission Sought/Granted Permission sought  to share information with : Facility Art therapist granted to share information with : Yes, Verbal Permission Granted     Permission granted to share info w AGENCY: HH/DME        Emotional Assessment Appearance:: Appears stated age Attitude/Demeanor/Rapport: Engaged Affect (typically observed): Accepting, Appropriate,  Pleasant Orientation: : Oriented to Self, Oriented to Place, Oriented to  Time, Oriented to Situation Alcohol / Substance Use: Not Applicable Psych Involvement: No (comment)  Admission diagnosis:  Carotid stenosis [I65.29] Carotid stenosis, asymptomatic [I65.29] Patient Active Problem List   Diagnosis Date Noted   Carotid stenosis 03/10/2022   Carotid stenosis, asymptomatic 03/10/2022   OSA (obstructive sleep apnea) 12/17/2021   CAD in native artery 03/10/2021   Snoring 03/10/2021   Ductal carcinoma in situ (DCIS) of right breast 12/29/2020   Long term (current) use of anticoagulants 06/24/2020   Medication management 07/01/2016   Abnormal nuclear stress test    Chronic diastolic heart failure ([place]) 05/20/2015   Hyperlipidemia 05/20/2015   Annual physical exam 06/13/2014   Antral gastritis 05/07/2014   Anemia 05/06/2014   Renal failure 05/06/2014   Type 2 diabetes mellitus without complication ([place])    RUQ pain 05/03/2014   Rectal bleeding 12/10/2013   Pelvic mass in female 11/06/2013   Epistaxis 08/19/2012   DVT (deep venous thrombosis) ([place]) 08/19/2012   DM (diabetes mellitus) ([place]) 08/19/2012   HTN (hypertension) 08/19/2012   PCP:  Sharilyn Sites, MD Pharmacy:   [facility] - [place], [place] - [address] [place] [address] Phone: [phone] Fax: [phone]  [id number] [id number]: [phone]: [phone]    Social Determinants of Health (SDOH) Interventions    Readmission Risk Interventions     No data to display

## 2022-03-12 NOTE — Progress Notes (Signed)
  Progress Note    03/12/2022 3:22 PM 2 Days Post-Op  Subjective:  patient still feeling uncertain about going home today. Having dizziness. Reports several years of issues with vertigo and glaucoma that effects her balance. She normally takes meclizine for this. Otherwise nausea improved and pain well controlled   Vitals:   03/12/22 0847 03/12/22 1150  BP: (!) 144/82 (!) 126/49  Pulse: 81 75  Resp: 18 18  Temp: 98.4 F (36.9 C) 98.7 F (37.1 C)  SpO2: 96% 95%   Will plan for D/c tomorrow. She normally lives alone but plans to stay with friend for a week or two. Order placed for [facility] PT. Order placed to restart home meclizine prn for her dizziness. Home DME rolling walker order placed as well   Karoline Caldwell, PA-C [facility] [phone] 03/12/2022 3:22 PM

## 2022-03-13 LAB — GLUCOSE, CAPILLARY: Glucose-Capillary: 98 mg/dL (ref 70–99)

## 2022-03-13 LAB — PROTIME-INR
INR: 1.3 — ABNORMAL HIGH (ref 0.8–1.2)
Prothrombin Time: 15.7 seconds — ABNORMAL HIGH (ref 11.4–15.2)

## 2022-03-13 MED ORDER — OXYCODONE HCL 5 MG PO TABS: 5.0000 mg | ORAL_TABLET | Freq: Four times a day (QID) | ORAL | 0 refills | Status: DC | PRN

## 2022-03-13 NOTE — Progress Notes (Signed)
ANTICOAGULATION CONSULT NOTE  Pharmacy Consult for warfarin Indication:  Hx VTE  Allergies  Allergen Reactions   Alphagan [Brimonidine] Itching   Diflunisal Swelling    Other reaction(s): ENTIRE BODY SWELLING   Vioxx [Rofecoxib] Shortness Of Breath   Metformin And Related     Kidney failure   Nexlizet [Bempedoic Acid-Ezetimibe]     Causes elevated Liver and Kidney function   Repatha [Evolocumab]     MYALGIAS   Codeine Rash   Elemental Sulfur Rash   Motrin [Ibuprofen] Rash   Penicillins Rash   Pravastatin Rash    Patient Measurements: Height: '5\' 2"'$  (157.5 cm) Weight: 73.9 kg (163 lb) IBW/kg (Calculated) : 50.1  Vital Signs: Temp: 97.7 F (36.5 C) (09/09 0717) Temp Source: Oral (09/09 0717) BP: 126/55 (09/09 0717) Pulse Rate: 69 (09/09 0717)  Labs: Recent Labs    03/11/22 0650 03/12/22 0617 03/13/22 0225  HGB 13.1 13.6  --   HCT 40.3 41.7  --   PLT 179 161  --   LABPROT 15.4* 14.8 15.7*  INR 1.2 1.2 1.3*  CREATININE 0.88  --   --      Estimated Creatinine Clearance: 47.2 mL/min (by C-G formula based on SCr of 0.88 mg/dL).   Medical History: Past Medical History:  Diagnosis Date   Antral gastritis    EGD 11/15   Asthmatic bronchitis    Back pain    Breast cancer ([place])    right breast   CAD in native artery 03/10/2021   Chronic diastolic heart failure ([place]) 05/20/2015   Grade 2 diastolic dysfunction.  04/2015.   Chronic kidney disease    kidney function low   Diabetes mellitus    x 5 yrs   DVT of axillary vein, acute left (HCC) 07/24/2012   GERD (gastroesophageal reflux disease)    Glaucoma    POAG OU   Heart murmur    History of hiatal hernia    History of kidney stones    Hyperlipidemia 05/20/2015   Hypertension    Hypertensive retinopathy    OU   Hypothyroidism    Kidney stones    Macular degeneration    Wet OD, Dry OS   Mixed hyperlipidemia    OSA (obstructive sleep apnea) 12/17/2021   Peripheral venous insufficiency    Pinched  nerve    right elbow   Pneumonia    PONV (postoperative nausea and vomiting)    Sigmoid diverticulitis    Snoring 03/10/2021   Vertigo    chonic    Medications:  Scheduled:   amitriptyline  25 mg Oral QHS   anastrozole  1 mg Oral QHS   carvedilol  6.25 mg Oral BID   dapagliflozin propanediol  5 mg Oral q morning   docusate sodium  100 mg Oral Daily   gabapentin  300 mg Oral TID   glimepiride  2 mg Oral Q breakfast   heparin  5,000 Units Subcutaneous Q8H   hydrALAZINE  25 mg Oral BID AC & HS   isosorbide mononitrate  30 mg Oral Daily   latanoprost  1 drop Left Eye QHS   linagliptin  5 mg Oral Daily   pantoprazole  40 mg Oral Daily   potassium chloride SA  10 mEq Oral Daily   prednisoLONE acetate  1 drop Right Eye BID   warfarin  2 mg Oral q1600   Warfarin - Pharmacist Dosing Inpatient   Does not apply q1600    Assessment: 34 yof now s/p L  CEA on 9/6. Was on warfarin PTA for hx VTE - PTA regimen is warfarin 2 mg daily (LD 9/2).  INR remains subtherapeutic at 1.3 - warfarin restarted 9/7. CBC stable - Hgb 13.6, plt 161 stable. On subQ heparin for DVT prophylaxis - no need for therapeutic bridge per vascular, will continue heparin subQ until therapeutic and while in the hospital.  Goal of Therapy:  INR 2-3 Monitor platelets by anticoagulation protocol: Yes   Plan:  Warfarin 2 mg tonight  Would recommend continuing warfarin at PTA dosing of 2 mg daily at discharge  Monitor daily INR, CBC, and for s/sx of bleeding  Eliseo Gum, PharmD PGY1 Pharmacy Resident   03/13/2022  9:50 AM   Please check AMION for all [place] phone numbers After 10:00 PM, call [place] [phone]

## 2022-03-13 NOTE — TOC Transition Note (Signed)
Transition of Care [facility]) - CM/SW Discharge Note   Patient Details  Name: ALINAH SHEARD MRN: [id number] Date of Birth: Oct 16, 1940  Transition of Care [facility] CM/SW Contact:  Konrad Penta, RN Phone Number: [phone] 03/13/2022, 9:24 AM   Clinical Narrative:   Spoke with Mrs. Bayard Males. Rolling walker has not been delivered to the room yet. Was ordered on yesterday by weekday RNCM. Mrs. Lerch confirms she is going to stay with a friend post hospitalization. Confirmed address [address] [place], [place].  Mrs. Tribbey states her friend will pick her up today.   Confirmed with Amy with [facility] that Mrs. Zhang will transition to her friend's home. Provided address again to confirm.   Called Jasmine with [employer] to request RW be delivered to patient's room before discharge today.     Final next level of care: [place] Barriers to Discharge: No Barriers Identified   Patient Goals and CMS Choice Patient states their goals for this hospitalization and ongoing recovery are:: return home CMS Medicare.gov Compare Post Acute Care list provided to:: Patient Choice offered to / list presented to : Patient  Discharge Placement                       Discharge Plan and Services   Discharge Planning Services: CM Consult Post Acute Care Choice: Home Health, Durable Medical Equipment          DME Arranged: Walker rolling DME Agency: [facility] Date DME Agency Contacted: 03/13/22 Time DME Agency Contacted: [phone] Representative spoke with at DME Agency: [place]: PT [place]: [place] Date [place]: 03/12/22 Time [place]: [place] Representative spoke with at [place]: [employer] Determinants of Health ([place]) Interventions     Readmission Risk Interventions     No data to display

## 2022-03-13 NOTE — Discharge Summary (Signed)
Carotid Discharge Summary     [name] [birth year] 81 y.o. female  [id number]  Admission Date: 03/10/2022  Discharge Date: 03/13/2022  Physician: Jamelle Haring, MD  Admission Diagnosis: Carotid stenosis [I65.29] Carotid stenosis, asymptomatic [facility]  Hospital Course:  The patient was admitted to the hospital and taken to the operating room on 03/10/2022 and underwent left carotid endarterectomy.  The pt tolerated the procedure well and was transported to the PACU in good condition.  By POD 1, the pt neuro status remained intact. Her left neck incision has remained intact and well appearing. She remained hemodynamically stable. Vitals stable. She later had rough evening with nausea and vomiting x 3 after eating dinner. Otherwise pain well controlled  By POD#2, feeling a little better. Still some nausea. Left neck incision intact and well appearing. Mild incisional soreness. Neurologically intact. Tolerated mobility.  POD#3, improved nausea. No further vomiting. Tolerating diet. Pain controlled. Neuro remained intact. Incision intact without swelling or hematoma. Consult placed for PT evaluation. Recommended HH PT. Orders placed for [facility] PT. Home DME orders placed for rolling walker. Having some dizziness. Reported history of vertigo. Takes meclizine at home. Restarted this and she had improvement  The remainder of the hospital course consisted of increasing mobilization, improvement of dizziness, and increasing intake of solids without difficulty.  She remained stable for discharge post operative day 3. Remained neurologically intact. Left neck incision dressings clean, dry and intact. Tolerating diet. Mobilizing well with assistance. She will continue on home medications as prescribed. PDMP reviewed and post operative pain medication sent to patients pharmacy. She has  [place] [facility] PT arranged. [facility] until she is ready to go home.  She will follow  up in 2 weeks for incisional check  Recent Labs    03/11/22 0650  NA 142  K 3.8  CL 109  CO2 24  GLUCOSE 105*  BUN 19  CALCIUM 8.9   Recent Labs    03/11/22 0650 03/12/22 0617  WBC 7.4 6.8  HGB 13.1 13.6  HCT 40.3 41.7  PLT 179 161   Recent Labs    03/12/22 0617 03/13/22 0225  INR 1.2 1.3*     Discharge Instructions     Discharge patient   Complete by: As directed    Discharge disposition: 01-Home or Self Care   Discharge patient date: 03/11/2022       Discharge Diagnosis:  Carotid stenosis [I65.29] Carotid stenosis, asymptomatic [I65.29]  Secondary Diagnosis: Patient Active Problem List   Diagnosis Date Noted   Carotid stenosis 03/10/2022   Carotid stenosis, asymptomatic 03/10/2022   OSA (obstructive sleep apnea) 12/17/2021   CAD in native artery 03/10/2021   Snoring 03/10/2021   Ductal carcinoma in situ (DCIS) of right breast 12/29/2020   Long term (current) use of anticoagulants 06/24/2020   Medication management 07/01/2016   Abnormal nuclear stress test    Chronic diastolic heart failure ([place]) 05/20/2015   Hyperlipidemia 05/20/2015   Annual physical exam 06/13/2014   Antral gastritis 05/07/2014   Anemia 05/06/2014   Renal failure 05/06/2014   Type 2 diabetes mellitus without complication ([place])    RUQ pain 05/03/2014   Rectal bleeding 12/10/2013   Pelvic mass in female 11/06/2013   Epistaxis 08/19/2012   DVT (deep venous thrombosis) ([place]) 08/19/2012   DM (diabetes mellitus) ([place]) 08/19/2012   HTN (hypertension) 08/19/2012   Past Medical History:  Diagnosis Date   Antral gastritis    EGD 11/15  Asthmatic bronchitis    Back pain    Breast cancer ([place])    right breast   CAD in native artery 03/10/2021   Chronic diastolic heart failure ([place]) 05/20/2015   Grade 2 diastolic dysfunction.  04/2015.   Chronic kidney disease    kidney function low   Diabetes mellitus    x 5 yrs   DVT of axillary vein, acute left (HCC) 07/24/2012   GERD  (gastroesophageal reflux disease)    Glaucoma    POAG OU   Heart murmur    History of hiatal hernia    History of kidney stones    Hyperlipidemia 05/20/2015   Hypertension    Hypertensive retinopathy    OU   Hypothyroidism    Kidney stones    Macular degeneration    Wet OD, Dry OS   Mixed hyperlipidemia    OSA (obstructive sleep apnea) 12/17/2021   Peripheral venous insufficiency    Pinched nerve    right elbow   Pneumonia    PONV (postoperative nausea and vomiting)    Sigmoid diverticulitis    Snoring 03/10/2021   Vertigo    chonic    Allergies as of 03/13/2022       Reactions   Alphagan [brimonidine] Itching   Diflunisal Swelling   Other reaction(s): ENTIRE BODY SWELLING   Vioxx [rofecoxib] Shortness Of Breath   Metformin And Related    Kidney failure   Nexlizet [bempedoic Acid-ezetimibe]    Causes elevated Liver and Kidney function   Repatha [evolocumab]    MYALGIAS   Codeine Rash   Elemental Sulfur Rash   Motrin [ibuprofen] Rash   Penicillins Rash   Pravastatin Rash        Medication List     STOP taking these medications    tetrahydrozoline 0.05 % ophthalmic solution       TAKE these medications    albuterol 108 (90 Base) MCG/ACT inhaler Commonly known as: VENTOLIN HFA Inhale 2 puffs into the lungs every 4 (four) hours as needed for shortness of breath.   amitriptyline 25 MG tablet Commonly known as: ELAVIL Take 25 mg by mouth at bedtime.   anastrozole 1 MG tablet Commonly known as: ARIMIDEX TAKE 1 TABLET BY MOUTH EVERY DAY What changed: when to take this   Black Cohosh 40 MG Caps Take 40 mg by mouth 2 (two) times daily.   carvedilol 6.25 MG tablet Commonly known as: COREG Take 1 tablet (6.25 mg total) by mouth 2 (two) times daily.   cetirizine 10 MG tablet Commonly known as: ZYRTEC Take 10 mg by mouth daily.   cyanocobalamin 100 MCG tablet Commonly known as: VITAMIN B12 Take 100 mcg by mouth daily.   diazepam 2 MG  tablet Commonly known as: VALIUM Take 2 mg by mouth 2 (two) times daily as needed for anxiety (dizziness).   dorzolamide-timolol 22.3-6.8 MG/ML ophthalmic solution Commonly known as: COSOPT INSTILL 1 DROP INTO RIGHT EYE TWICE A DAY   esomeprazole 20 MG capsule Commonly known as: NEXIUM Take 20 mg by mouth daily at 12 noon.   Farxiga 5 MG Tabs tablet Generic drug: dapagliflozin propanediol Take 5 mg by mouth every morning.   Fish Oil 1200 MG Caps Take 1,200 mg by mouth 2 (two) times daily.   furosemide 40 MG tablet Commonly known as: LASIX TAKE 1 TABLET (40 MG TOTAL) BY MOUTH DAILY. MAY TAKE EXTRA DAILY AS NEEDED FOR SWELLING   gabapentin 300 MG capsule Commonly known as: NEURONTIN Take  300 mg by mouth 3 (three) times daily.   glimepiride 2 MG tablet Commonly known as: AMARYL Take 2 mg by mouth daily.   hydrALAZINE 25 MG tablet Commonly known as: APRESOLINE TAKE ONE TABLET BY MOUTH AT BREAKFAST AND AT BEDTIME   isosorbide mononitrate 30 MG 24 hr tablet Commonly known as: IMDUR TAKE ONE TABLET BY MOUTH ONCE DAILY   latanoprost 0.005 % ophthalmic solution Commonly known as: XALATAN Place 1 drop into the left eye at bedtime.   meclizine 25 MG tablet Commonly known as: ANTIVERT Take 25 mg by mouth 2 (two) times daily as needed for dizziness.   oxyCODONE 5 MG immediate release tablet Commonly known as: Oxy IR/ROXICODONE Take 1-2 tablets (5-10 mg total) by mouth every 6 (six) hours as needed for moderate pain.   polyethylene glycol 17 g packet Commonly known as: MIRALAX / GLYCOLAX Take 17 g by mouth daily.   potassium chloride SA 20 MEQ tablet Commonly known as: KLOR-CON M TAKE ONE-HALF TABLET BY  MOUTH DAILY   Praluent 75 MG/ML [place] Generic drug: Alirocumab Inject 75 mg into the skin every 14 (fourteen) days.   prednisoLONE acetate 1 % ophthalmic suspension Commonly known as: PRED FORTE Place 1 drop into the right eye 2 (two) times daily. Taper    PRESERVISION AREDS 2 PO Take 1 capsule by mouth in the morning and at bedtime.   SYSTANE OP Place 1 drop into the left eye daily as needed (dry eye).   Tradjenta 5 MG Tabs tablet Generic drug: linagliptin Take 5 mg by mouth daily.   Vitamin D-3 25 MCG (1000 UT) Caps Take 1,000 Units by mouth daily.   warfarin 2 MG tablet Commonly known as: COUMADIN Take as directed. If you are unsure how to take this medication, talk to your nurse or doctor. Original instructions: TAKE ONE TABLET BY MOUTH EVERYDAY AT BEDTIME AS DIRECTED by THE coumadin Clinic What changed: See the new instructions.               Durable Medical Equipment  (From admission, onward)           Start     Ordered   03/12/22 1249  For home use only DME Walker rolling  Once       Question Answer Comment  Walker: With [place] Wheels   Patient needs a walker to treat with the following condition Difficulty in walking, not elsewhere classified      03/12/22 1248             Discharge Instructions:   [facility] Discharge Instructions Carotid Endarterectomy (CEA)  Please refer to the following instructions for your post-procedure care. Your surgeon or physician assistant will discuss any changes with you.  Activity  You are encouraged to walk as much as you can. You can slowly return to normal activities but must avoid strenuous activity and heavy lifting until your doctor tell you it's OK. Avoid activities such as vacuuming or swinging a golf club. You can drive after one week if you are comfortable and you are no longer taking prescription pain medications. It is normal to feel tired for serval weeks after your surgery. It is also normal to have difficulty with sleep habits, eating, and bowel movements after surgery. These will go away with time.  Bathing/Showering  You may shower after you come home. Do not soak in a bathtub, hot tub, or swim until the incision  heals completely.  Incision Care  Shower every day. Clean your incision with mild soap and water. Pat the area dry with a clean towel. You do not need a bandage unless otherwise instructed. Do not apply any ointments or creams to your incision. You may have skin glue on your incision. Do not peel it off. It will come off on its own in about one week. Your incision may feel thickened and raised for several weeks after your surgery. This is normal and the skin will soften over time. For Men Only: It's OK to shave around the incision but do not shave the incision itself for 2 weeks. It is common to have numbness under your chin that could last for several months.  Diet  Resume your normal diet. There are no special food restrictions following this procedure. A low fat/low cholesterol diet is recommended for all patients with vascular disease. In order to heal from your surgery, it is CRITICAL to get adequate nutrition. Your body requires vitamins, minerals, and protein. Vegetables are the best source of vitamins and minerals. Vegetables also provide the perfect balance of protein. Processed food has little nutritional value, so try to avoid this.  Medications  Resume taking all of your medications unless your doctor or physician assistant tells you not to.  If your incision is causing pain, you may take over-the- counter pain relievers such as acetaminophen (Tylenol). If you were prescribed a stronger pain medication, please be aware these medications can cause nausea and constipation.  Prevent nausea by taking the medication with a snack or meal. Avoid constipation by drinking plenty of fluids and eating foods with a high amount of fiber, such as fruits, vegetables, and grains. Do not take Tylenol if you are taking prescription pain medications.  Follow Up  Our office will schedule a follow up appointment 2-3 weeks following discharge.  Please call us immediately for any of the following  conditions  Increased pain, redness, drainage (pus) from your incision site. Fever of 101 degrees or higher. If you should develop stroke (slurred speech, difficulty swallowing, weakness on one side of your body, loss of vision) you should call 911 and go to the nearest emergency room.  Reduce your risk of vascular disease:  Stop smoking. If you would like help call [facility] at 1-800-QUIT-NOW 709-648-5285) or [place] at (781)043-0744. Manage your cholesterol Maintain a desired weight Control your diabetes Keep your blood pressure down  If you have any questions, please call the office at [phone].  Prescriptions given: Oxycodone 5 mg  #20 No Refill  Disposition: Home  Patient's condition: is Good  Follow up: 1. Dr. Stanford Breed in 2 weeks.   Kohlton Gilpatrick PA-C [facility] [phone]   --- For [facility] Registry use ---   Modified Rankin score at D/C (0-6): 0  IV medication needed for:  1. Hypertension: No 2. Hypotension: No  Post-op Complications: No  1. Post-op CVA or TIA: No  If yes: Event classification (right eye, left eye, right cortical, left cortical, verterobasilar, other): n/a  If yes: Timing of event (intra-op, <6 hrs post-op, >=6 hrs post-op, unknown): n/a  2. CN injury: No  If yes: CN not injuried   3. Myocardial infarction: No  If yes: Dx by (EKG or clinical, Troponin): n/a  4.  CHF: No  5.  Dysrhythmia (new): No  6. Wound infection: No  7. Reperfusion symptoms: No  8. Return to OR: No  If yes: return to OR for (bleeding, neurologic, other CEA  incision, other): n/a  Discharge medications: Statin use:  No ASA use:  No   Beta blocker use:  Yes ACE-Inhibitor use:  No  ARB use:  No CCB use: No P2Y12 Antagonist use: No, '[ ]'$  Plavix, '[ ]'$  Plasugrel, '[ ]'$  Ticlopinine, '[ ]'$  Ticagrelor, '[ ]'$  Other, '[ ]'$  No for medical reason, '[ ]'$  Non-compliant, '[ ]'$  Not-indicated Anti-coagulant use:  Yes, '[X]'$  Warfarin, '[ ]'$  Rivaroxaban, '[ ]'$   Dabigatran,

## 2022-03-13 NOTE — Progress Notes (Addendum)
  Progress Note    03/13/2022 8:43 AM 3 Days Post-Op  Subjective:  no major complaints. Says she slept well. Meclizine yesterday helped her dizziness   Vitals:   03/13/22 0600 03/13/22 0717  BP: (!) 109/46 (!) 126/55  Pulse:  69  Resp:    Temp:  97.7 F (36.5 C)  SpO2:  100%   Physical Exam: Cardiac:  regular Lungs:  non labored Incisions:  left neck incision is c/d/I without swelling or hematoma. Has steri strips intact Extremities:  moving all extremities without deficits Neurologic: alert and oriented. Speech coherent  CBC    Component Value Date/Time   WBC 6.8 03/12/2022 0617   RBC 4.44 03/12/2022 0617   HGB 13.6 03/12/2022 0617   HGB 12.3 05/20/2015 1406   HCT 41.7 03/12/2022 0617   HCT 36.7 05/20/2015 1406   PLT 161 03/12/2022 0617   PLT 271 05/20/2015 1406   MCV 93.9 03/12/2022 0617   MCV 87 05/20/2015 1406   MCH 30.6 03/12/2022 0617   MCHC 32.6 03/12/2022 0617   RDW 13.2 03/12/2022 0617   RDW 14.1 05/20/2015 1406   LYMPHSABS 2.3 04/17/2015 1010   MONOABS 0.4 05/03/2014 0852   EOSABS 0.1 04/17/2015 1010   BASOSABS 0.0 04/17/2015 1010    BMET    Component Value Date/Time   NA 142 03/11/2022 0650   NA 144 08/20/2021 1120   K 3.8 03/11/2022 0650   CL 109 03/11/2022 0650   CO2 24 03/11/2022 0650   GLUCOSE 105 (H) 03/11/2022 0650   BUN 19 03/11/2022 0650   BUN 21 08/20/2021 1120   CREATININE 0.88 03/11/2022 0650   CREATININE 1.12 (H) 08/17/2016 0914   CALCIUM 8.9 03/11/2022 0650   GFRNONAA >60 03/11/2022 0650   GFRAA 45 (L) 10/05/2019 0859    INR    Component Value Date/Time   INR 1.3 (H) 03/13/2022 0225     Intake/Output Summary (Last 24 hours) at 03/13/2022 0843 Last data filed at 03/13/2022 0359 Gross per 24 hour  Intake 960 ml  Output --  Net 960 ml     Assessment/Plan:  81 y.o. female is s/p left CEA 3 Days Post-Op   Neurologically intact Left neck incision is clean, dry and intact without swelling or hematoma She will d/c on  her home Warfarin dose TOC arranging HH PT with [facility] She plans to d/c to her friends house for a week or so prior to returning home on her own She is stable for discharge home today  She will have follow up in 2 weeks in our office for incision check   [name] [facility] [phone] 03/13/2022 8:43 AM  VASCULAR STAFF ADDENDUM: I have independently interviewed and examined the patient. I agree with the above.  Home today  Cassandria Santee, MD [facility] Phone Number: [phone] 03/13/2022 10:10 AM

## 2022-03-13 NOTE — Progress Notes (Signed)
Discharge education reviewed with pt, she expresses understanding of appointments and home medications.  Pain med given prior to discharge for increasing pain at incision site. Also contacted C. Baglia, PA to have rx for oxycodone sent to alternate [facility] [address] [facility] [place] instead of pharmacy in [place] which is a home delivery service.

## 2022-03-14 DIAGNOSIS — I10 Essential (primary) hypertension: Secondary | ICD-10-CM | POA: Diagnosis not present

## 2022-03-14 DIAGNOSIS — G4733 Obstructive sleep apnea (adult) (pediatric): Secondary | ICD-10-CM | POA: Diagnosis not present

## 2022-03-15 NOTE — Care Management Important Message (Signed)
Important Message  Patient Details  Name: [name] MRN: [id number] Date of Birth: 01-20-1941   Medicare Important Message Given:  Yes     Javarian Jakubiak [address] 03/15/2022, 8:46 AM

## 2022-03-17 DIAGNOSIS — F419 Anxiety disorder, unspecified: Secondary | ICD-10-CM | POA: Diagnosis not present

## 2022-03-18 ENCOUNTER — Telehealth: Payer: Self-pay

## 2022-03-18 NOTE — Telephone Encounter (Signed)
Bufford Spikes called stating that she was the pt's caregiver and the pt needs a refill on her pain meds so she has some through the weekend.  Reviewed pt's chart, no one listed in pt's chart by that name, called pt for clarification, two identifiers used. Pt confirmed that Sheppard Plumber is her caregiver. She stated that she is staying with her at her house because the pt lives alone. Informed her that because she is not listed in her chart, we were not allowed to speak with her. Pt confirmed understanding.   Explained to pt that she was given enough pain meds for 4-5 days and typically the surgery that she had should not require additional pain meds. Explained that the Oxycodone should only be used for severe pain and she may still have some discomfort. Asked if pt was taking OTC meds and she explained that she was really only taking the Oxy to help her sleep at night. Instructed her to take Tylenol around the clock and she should not need the Oxy, but if she had any severe pain, she should go to ED. Confirmed understanding.

## 2022-03-22 ENCOUNTER — Ambulatory Visit: Payer: HMO | Attending: Cardiology | Admitting: *Deleted

## 2022-03-22 DIAGNOSIS — I82A12 Acute embolism and thrombosis of left axillary vein: Secondary | ICD-10-CM | POA: Diagnosis not present

## 2022-03-22 DIAGNOSIS — Z5181 Encounter for therapeutic drug level monitoring: Secondary | ICD-10-CM | POA: Diagnosis not present

## 2022-03-22 LAB — POCT INR: INR: 1.3 — AB (ref 2.0–3.0)

## 2022-03-22 NOTE — Patient Instructions (Signed)
S/P Lt CEA on 03/10/22   Take warfarin 2 tablets tonight and tomorrow night then resume 1 tablet daily   Recheck INR in 1 wk.  Call Coumadin clinic for any questions or changes in medications.

## 2022-03-29 ENCOUNTER — Ambulatory Visit: Payer: HMO | Attending: Cardiology | Admitting: *Deleted

## 2022-03-29 DIAGNOSIS — I82A12 Acute embolism and thrombosis of left axillary vein: Secondary | ICD-10-CM | POA: Diagnosis not present

## 2022-03-29 DIAGNOSIS — Z5181 Encounter for therapeutic drug level monitoring: Secondary | ICD-10-CM

## 2022-03-29 LAB — POCT INR: INR: 2 (ref 2.0–3.0)

## 2022-03-29 NOTE — Patient Instructions (Signed)
Description   S/P Lt CEA on 03/10/22   Take warfarin 1.5 tablets today,  then resume 1 tablet daily   Recheck INR in 1 wk.  Call Coumadin clinic for any questions or changes in medications.

## 2022-03-30 ENCOUNTER — Ambulatory Visit (INDEPENDENT_AMBULATORY_CARE_PROVIDER_SITE_OTHER): Payer: HMO | Admitting: Physician Assistant

## 2022-03-30 VITALS — BP 140/67 | HR 62 | Temp 97.8°F | Resp 20 | Ht 62.0 in | Wt 160.8 lb

## 2022-03-30 DIAGNOSIS — I6522 Occlusion and stenosis of left carotid artery: Secondary | ICD-10-CM

## 2022-03-30 NOTE — Progress Notes (Signed)
POST OPERATIVE OFFICE NOTE    CC:  F/u for surgery  HPI:  This is a 81 y.o. female who is s/p left carotid endarterectomy by Dr. Stanford Breed on 03/10/2022 due to asymptomatic high-grade stenosis.  Patient denies any strokelike symptoms including slurring speech, changes in vision, or one-sided weakness.  She believes her incision is healing well.  She does have some peri-incisional numbness.  She has an allergy to statins.  She is on Coumadin for DVT history.  Allergies  Allergen Reactions   Alphagan [Brimonidine] Itching   Diflunisal Swelling    Other reaction(s): ENTIRE BODY SWELLING   Vioxx [Rofecoxib] Shortness Of Breath   Metformin And Related     Kidney failure   Nexlizet [Bempedoic Acid-Ezetimibe]     Causes elevated Liver and Kidney function   Repatha [Evolocumab]     MYALGIAS   Codeine Rash   Elemental Sulfur Rash   Motrin [Ibuprofen] Rash   Penicillins Rash   Pravastatin Rash    Current Outpatient Medications  Medication Sig Dispense Refill   albuterol (PROVENTIL HFA;VENTOLIN HFA) 108 (90 BASE) MCG/ACT inhaler Inhale 2 puffs into the lungs every 4 (four) hours as needed for shortness of breath. 1 Inhaler 0   Alirocumab (PRALUENT) 75 MG/ML SOAJ Inject 75 mg into the skin every 14 (fourteen) days. 2 mL 11   amitriptyline (ELAVIL) 25 MG tablet Take 25 mg by mouth at bedtime.     anastrozole (ARIMIDEX) 1 MG tablet TAKE 1 TABLET BY MOUTH EVERY DAY (Patient taking differently: Take 1 mg by mouth at bedtime.) 90 tablet 3   carvedilol (COREG) 6.25 MG tablet Take 1 tablet (6.25 mg total) by mouth 2 (two) times daily. 180 tablet 3   cetirizine (ZYRTEC) 10 MG tablet Take 10 mg by mouth daily.     Cholecalciferol (VITAMIN D-3) 1000 units CAPS Take 1,000 Units by mouth daily.     diazepam (VALIUM) 2 MG tablet Take 2 mg by mouth 2 (two) times daily as needed for anxiety (dizziness).     esomeprazole (NEXIUM) 20 MG capsule Take 20 mg by mouth daily at 12 noon.     FARXIGA 5 MG TABS tablet  Take 5 mg by mouth every morning.     furosemide (LASIX) 40 MG tablet TAKE 1 TABLET (40 MG TOTAL) BY MOUTH DAILY. MAY TAKE EXTRA DAILY AS NEEDED FOR SWELLING 180 tablet 0   gabapentin (NEURONTIN) 300 MG capsule Take 300 mg by mouth 3 (three) times daily.     hydrALAZINE (APRESOLINE) 25 MG tablet TAKE ONE TABLET BY MOUTH AT BREAKFAST AND AT BEDTIME 180 tablet 1   isosorbide mononitrate (IMDUR) 30 MG 24 hr tablet TAKE ONE TABLET BY MOUTH ONCE DAILY 90 tablet 3   latanoprost (XALATAN) 0.005 % ophthalmic solution Place 1 drop into the left eye at bedtime.     meclizine (ANTIVERT) 25 MG tablet Take 25 mg by mouth 2 (two) times daily as needed for dizziness.     Multiple Vitamins-Minerals (PRESERVISION AREDS 2 PO) Take 1 capsule by mouth in the morning and at bedtime.     Omega-3 Fatty Acids (FISH OIL) 1200 MG CAPS Take 1,200 mg by mouth 2 (two) times daily.     Polyethyl Glycol-Propyl Glycol (SYSTANE OP) Place 1 drop into the left eye daily as needed (dry eye).     polyethylene glycol (MIRALAX / GLYCOLAX) packet Take 17 g by mouth daily.     potassium chloride SA (KLOR-CON M) 20 MEQ tablet TAKE ONE-HALF  TABLET BY  MOUTH DAILY 45 tablet 2   prednisoLONE acetate (PRED FORTE) 1 % ophthalmic suspension Place 1 drop into the right eye 2 (two) times daily. Taper     TRADJENTA 5 MG TABS tablet Take 5 mg by mouth daily.     vitamin B-12 (CYANOCOBALAMIN) 100 MCG tablet Take 100 mcg by mouth daily.     warfarin (COUMADIN) 2 MG tablet TAKE ONE TABLET BY MOUTH EVERYDAY AT BEDTIME AS DIRECTED by THE coumadin Clinic (Patient taking differently: Take 2 mg by mouth daily at 4 PM. AS DIRECTED by THE coumadin clinic) 40 tablet 2   dorzolamide-timolol (COSOPT) 22.3-6.8 MG/ML ophthalmic solution INSTILL 1 DROP INTO RIGHT EYE TWICE A DAY (Patient not taking: Reported on 03/05/2022) 30 mL 2   glimepiride (AMARYL) 2 MG tablet Take 2 mg by mouth daily. (Patient not taking: Reported on 03/30/2022)  2   oxyCODONE (OXY  IR/ROXICODONE) 5 MG immediate release tablet Take 1-2 tablets (5-10 mg total) by mouth every 6 (six) hours as needed for moderate pain. (Patient not taking: Reported on 03/30/2022) 20 tablet 0   No current facility-administered medications for this visit.     ROS:  See HPI  Physical Exam:  Vitals:   03/30/22 1353 03/30/22 1354  BP: (!) 151/71 (!) 140/67  Pulse: 62   Resp: 20   Temp: 97.8 F (36.6 C)   TempSrc: Temporal   SpO2: 98%   Weight: 160 lb 12.8 oz (72.9 kg)   Height: '5\' 2"'$  (1.575 m)     Incision:  left neck incision healing well Extremities:  moving all extremities well Neuro: CN grossly intact  Assessment/Plan:  This is a 81 y.o. female who is s/p: L CEA for high-grade asymptomatic stenosis  -Patient has not had any neurological events since discharge from the hospital -Left neck incision healing well -Check carotid duplex in 6 months   Dagoberto Ligas, PA-C [facility] [phone]  Clinic MD:  Stanford Breed

## 2022-03-31 ENCOUNTER — Other Ambulatory Visit: Payer: Self-pay

## 2022-03-31 DIAGNOSIS — I6522 Occlusion and stenosis of left carotid artery: Secondary | ICD-10-CM

## 2022-04-05 ENCOUNTER — Ambulatory Visit: Payer: HMO | Attending: Cardiology | Admitting: *Deleted

## 2022-04-05 DIAGNOSIS — Z5181 Encounter for therapeutic drug level monitoring: Secondary | ICD-10-CM

## 2022-04-05 DIAGNOSIS — I82A12 Acute embolism and thrombosis of left axillary vein: Secondary | ICD-10-CM

## 2022-04-05 LAB — POCT INR: INR: 5.3 — AB (ref 2.0–3.0)

## 2022-04-05 NOTE — Patient Instructions (Signed)
S/P Lt CEA on 03/10/22   Hold warfarin x 3 days then resume 1 tablet daily   Recheck INR in 1 wk.  Call Coumadin clinic for any questions or changes in medications.

## 2022-04-07 NOTE — Progress Notes (Signed)
Patient Care Team: Sharilyn Sites, MD as PCP - General Skeet Latch, MD as PCP - Cardiology (Cardiology) Kyung Rudd, MD as Consulting Physician (Radiation Oncology) [name], MD as Consulting Physician (Hematology and Oncology) Donnie Mesa, MD as Consulting Physician (General Surgery)  DIAGNOSIS:  Encounter Diagnosis  Name Primary?   Ductal carcinoma in situ (DCIS) of right breast     SUMMARY OF ONCOLOGIC HISTORY: Oncology History  Ductal carcinoma in situ (DCIS) of right breast  10/09/2020 Initial Diagnosis   Screening mammogram detected right breast calcifications: Initial biopsy showed focal atypical ductal hyperplasia   12/03/2020 Surgery   12/03/2020: Right lumpectomy:  intermediate grade DCIS ER 90% positive, PR negative margins negative   12/29/2020 Cancer Staging   Staging form: Breast, AJCC 8th Edition - Clinical stage from 12/29/2020: Stage 0 (cTis (DCIS), cN0, cM0, G2, ER+, PR-, HER2: Not Assessed) - Signed by [name], MD on 12/29/2020 Stage prefix: Initial diagnosis Histologic grading system: 3 grade system   01/22/2021 - 02/19/2021 Radiation Therapy   Radiation Treatment Dates: 01/22/2021 through 02/19/2021 Site Technique Total Dose (Gy) Dose per Fx (Gy) Completed Fx Beam Energies  Breast, Right: Breast_Rt 3D 42.56/42.56 2.66 16/16 10X  Breast, Right: Breast_Rt_Bst 3D 8/8 2 4/4 6X, 10X   Initially was going to forego, however proceeded due to close margins.   02/2021 -  Anti-estrogen oral therapy   Anastrozole daily   04/03/2021 Cancer Staging   Staging form: Breast, AJCC 8th Edition - Pathologic: Stage 0 (pTis (DCIS), pN0, cM0) - Signed by [name], NP on 04/03/2021 Stage prefix: Initial diagnosis     CHIEF COMPLIANT: Follow-up breast cancer on anastrozole  INTERVAL HISTORY: [name] is a 81 y.o. with the above-mentioned. She presents to the clinic for a follow-up. She states that she just had to have a stint put in her right  eye. She does have hot flashes and some joint stiffness in hands. She haven't ad time for exercise since she had her surgery.    ALLERGIES:  is allergic to alphagan [brimonidine], diflunisal, vioxx [rofecoxib], metformin and related, nexlizet [bempedoic acid-ezetimibe], repatha [evolocumab], codeine, elemental sulfur, motrin [ibuprofen], penicillins, and pravastatin.  MEDICATIONS:  Current Outpatient Medications  Medication Sig Dispense Refill   albuterol (PROVENTIL HFA;VENTOLIN HFA) 108 (90 BASE) MCG/ACT inhaler Inhale 2 puffs into the lungs every 4 (four) hours as needed for shortness of breath. 1 Inhaler 0   Alirocumab (PRALUENT) 75 MG/ML SOAJ Inject 75 mg into the skin every 14 (fourteen) days. 2 mL 11   amitriptyline (ELAVIL) 25 MG tablet Take 25 mg by mouth at bedtime.     anastrozole (ARIMIDEX) 1 MG tablet TAKE 1 TABLET BY MOUTH EVERY DAY (Patient taking differently: Take 1 mg by mouth at bedtime.) 90 tablet 3   carvedilol (COREG) 6.25 MG tablet Take 1 tablet (6.25 mg total) by mouth 2 (two) times daily. 180 tablet 3   cetirizine (ZYRTEC) 10 MG tablet Take 10 mg by mouth daily.     Cholecalciferol (VITAMIN D-3) 1000 units CAPS Take 1,000 Units by mouth daily.     diazepam (VALIUM) 2 MG tablet Take 2 mg by mouth 2 (two) times daily as needed for anxiety (dizziness).     dorzolamide-timolol (COSOPT) 22.3-6.8 MG/ML ophthalmic solution INSTILL 1 DROP INTO RIGHT EYE TWICE A DAY (Patient not taking: Reported on 03/05/2022) 30 mL 2   esomeprazole (NEXIUM) 20 MG capsule Take 20 mg by mouth daily at 12 noon.     Wilder Glade  5 MG TABS tablet Take 5 mg by mouth every morning.     furosemide (LASIX) 40 MG tablet TAKE 1 TABLET (40 MG TOTAL) BY MOUTH DAILY. MAY TAKE EXTRA DAILY AS NEEDED FOR SWELLING 180 tablet 0   gabapentin (NEURONTIN) 300 MG capsule Take 300 mg by mouth 3 (three) times daily.     glimepiride (AMARYL) 2 MG tablet Take 2 mg by mouth daily. (Patient not taking: Reported on 03/30/2022)  2    hydrALAZINE (APRESOLINE) 25 MG tablet TAKE ONE TABLET BY MOUTH AT BREAKFAST AND AT BEDTIME 180 tablet 1   isosorbide mononitrate (IMDUR) 30 MG 24 hr tablet TAKE ONE TABLET BY MOUTH ONCE DAILY 90 tablet 3   latanoprost (XALATAN) 0.005 % ophthalmic solution Place 1 drop into the left eye at bedtime.     meclizine (ANTIVERT) 25 MG tablet Take 25 mg by mouth 2 (two) times daily as needed for dizziness.     Multiple Vitamins-Minerals (PRESERVISION AREDS 2 PO) Take 1 capsule by mouth in the morning and at bedtime.     Omega-3 Fatty Acids (FISH OIL) 1200 MG CAPS Take 1,200 mg by mouth 2 (two) times daily.     oxyCODONE (OXY IR/ROXICODONE) 5 MG immediate release tablet Take 1-2 tablets (5-10 mg total) by mouth every 6 (six) hours as needed for moderate pain. (Patient not taking: Reported on 03/30/2022) 20 tablet 0   Polyethyl Glycol-Propyl Glycol (SYSTANE OP) Place 1 drop into the left eye daily as needed (dry eye).     polyethylene glycol (MIRALAX / GLYCOLAX) packet Take 17 g by mouth daily.     potassium chloride SA (KLOR-CON M) 20 MEQ tablet TAKE ONE-HALF TABLET BY  MOUTH DAILY 45 tablet 2   prednisoLONE acetate (PRED FORTE) 1 % ophthalmic suspension Place 1 drop into the right eye 2 (two) times daily. Taper     TRADJENTA 5 MG TABS tablet Take 5 mg by mouth daily.     vitamin B-12 (CYANOCOBALAMIN) 100 MCG tablet Take 100 mcg by mouth daily.     warfarin (COUMADIN) 2 MG tablet TAKE ONE TABLET BY MOUTH EVERYDAY AT BEDTIME AS DIRECTED by THE coumadin Clinic (Patient taking differently: Take 2 mg by mouth daily at 4 PM. AS DIRECTED by THE coumadin clinic) 40 tablet 2   No current facility-administered medications for this visit.    PHYSICAL EXAMINATION: ECOG PERFORMANCE STATUS: 1 - Symptomatic but completely ambulatory  Vitals:   04/12/22 1137  BP: (!) 151/66  Pulse: 74  Resp: 18  Temp: 97.8 F (36.6 C)  SpO2: 94%   Filed Weights   04/12/22 1137  Weight: 163 lb 8 oz (74.2 kg)    BREAST: No  palpable masses or nodules in either right or left breasts. No palpable axillary supraclavicular or infraclavicular adenopathy no breast tenderness or nipple discharge. (exam performed in the presence of a chaperone)  LABORATORY DATA:  I have reviewed the data as listed    Latest Ref Rng & Units 03/11/2022    6:50 AM 03/10/2022    6:59 AM 03/01/2022   12:41 PM  CMP  Glucose 70 - 99 mg/dL 105  131    BUN 8 - 23 mg/dL 19  24    Creatinine 0.44 - 1.00 mg/dL 0.88  1.46  1.00   Sodium 135 - 145 mmol/L 142  144    Potassium 3.5 - 5.1 mmol/L 3.8  3.9    Chloride 98 - 111 mmol/L 109  108  CO2 22 - 32 mmol/L 24  27    Calcium 8.9 - 10.3 mg/dL 8.9  9.9    Total Protein 6.5 - 8.1 g/dL  7.2    Total Bilirubin 0.3 - 1.2 mg/dL  0.6    Alkaline Phos 38 - 126 U/L  95    AST 15 - 41 U/L  28    ALT 0 - 44 U/L  34      Lab Results  Component Value Date   WBC 6.8 03/12/2022   HGB 13.6 03/12/2022   HCT 41.7 03/12/2022   MCV 93.9 03/12/2022   PLT 161 03/12/2022   NEUTROABS 2.5 04/17/2015    ASSESSMENT & PLAN:  Ductal carcinoma in situ (DCIS) of right breast 12/03/2020: Screening mammogram detected right breast calcifications 2.8 cm biopsy revealed intermediate grade DCIS ER 90% positive, PR negative 12/03/2020: Right lumpectomy: DCIS intermediate grade with calcifications approximately 2 cm in size.  ER 90%, PR 0%  Current treatment: Anastrozole started 12/29/2020 Anastrozole toxicities: 1.  Hot flashes: Patient had these symptoms before she started anastrozole 2. stiffness in the hands  Breast cancer surveillance: 1.  Breast exam 04/12/2022: Benign 2. mammogram 09/14/2021: Benign breast density category B  Recent surgery for carotid disease: She feels slightly dizzy when she turns her head.  I discussed with her that anastrozole is considered optional.  If she can tolerate it well we will continue with that but if she does not want to take more medications then it could be discontinued as  well.  Return to clinic in 1 year for follow-up    No orders of the defined types were placed in this encounter.  The patient has a good understanding of the overall plan. she agrees with it. she will call with any problems that may develop before the next visit here. Total time spent: 30 mins including face to face time and time spent for planning, charting and co-ordination of care   Harriette Ohara, MD 04/12/22    I Gardiner Coins am scribing for Dr. Lindi Adie  I have reviewed the above documentation for accuracy and completeness, and I agree with the above.

## 2022-04-12 ENCOUNTER — Other Ambulatory Visit: Payer: Self-pay

## 2022-04-12 ENCOUNTER — Inpatient Hospital Stay: Payer: HMO | Attending: Hematology and Oncology | Admitting: Hematology and Oncology

## 2022-04-12 ENCOUNTER — Ambulatory Visit: Payer: HMO | Attending: Cardiology | Admitting: *Deleted

## 2022-04-12 DIAGNOSIS — Z5181 Encounter for therapeutic drug level monitoring: Secondary | ICD-10-CM | POA: Diagnosis not present

## 2022-04-12 DIAGNOSIS — I82A12 Acute embolism and thrombosis of left axillary vein: Secondary | ICD-10-CM | POA: Diagnosis not present

## 2022-04-12 DIAGNOSIS — Z79811 Long term (current) use of aromatase inhibitors: Secondary | ICD-10-CM | POA: Insufficient documentation

## 2022-04-12 DIAGNOSIS — D0511 Intraductal carcinoma in situ of right breast: Secondary | ICD-10-CM | POA: Diagnosis not present

## 2022-04-12 LAB — POCT INR: INR: 1.8 — AB (ref 2.0–3.0)

## 2022-04-12 NOTE — Patient Instructions (Signed)
S/P Lt CEA on 03/10/22   Take warfarin 1 1/2 tablets tonight then resume 1 tablet daily   Recheck INR in 2 wk.  Call Coumadin clinic for any questions or changes in medications.

## 2022-04-12 NOTE — Assessment & Plan Note (Addendum)
12/03/2020: Screening mammogram detected right breast calcifications 2.8 cm biopsy revealed intermediate grade DCIS ER 90% positive, PR negative 12/03/2020: Right lumpectomy: DCIS intermediate grade with calcifications approximately 2 cm in size.  ER 90%, PR 0%  Current treatment: Anastrozole started 12/29/2020 Anastrozole toxicities: 1.  Hot flashes: Patient had these symptoms before she started anastrozole 2. stiffness in the hands  Breast cancer surveillance: 1.  Breast exam 04/12/2022: Benign 2. mammogram 09/14/2021: Benign breast density category B  Recent surgery for carotid disease: She feels slightly dizzy when she turns her head.  I discussed with her that anastrozole is considered optional.  If she can tolerate it well we will continue with that but if she does not want to take more medications then it could be discontinued as well.  Return to clinic in 1 year for follow-up

## 2022-04-26 ENCOUNTER — Ambulatory Visit: Payer: HMO | Attending: Cardiology | Admitting: *Deleted

## 2022-04-26 DIAGNOSIS — Z5181 Encounter for therapeutic drug level monitoring: Secondary | ICD-10-CM | POA: Diagnosis not present

## 2022-04-26 DIAGNOSIS — I82A12 Acute embolism and thrombosis of left axillary vein: Secondary | ICD-10-CM

## 2022-04-26 LAB — POCT INR: INR: 2.5 (ref 2.0–3.0)

## 2022-04-26 NOTE — Patient Instructions (Signed)
S/P Lt CEA on 03/10/22   Continue warfarin 1 tablet daily   Recheck INR in 4 wk.  Call Coumadin clinic for any questions or changes in medications.  

## 2022-04-29 ENCOUNTER — Other Ambulatory Visit: Payer: Self-pay | Admitting: Cardiovascular Disease

## 2022-04-29 NOTE — Telephone Encounter (Signed)
Rx(s) sent to pharmacy electronically.  

## 2022-05-13 NOTE — Progress Notes (Signed)
Standard Clinic Note  05/19/2022     CHIEF COMPLAINT Patient presents for Retina Follow Up  HISTORY OF PRESENT ILLNESS: [name] is a 81 y.o. female who presents to the clinic today for:  HPI     Retina Follow Up   Patient presents with  Wet AMD.  In right eye.  Severity is moderate.  Duration of 3 months.  Since onset it is stable.  I, the attending physician,  performed the HPI with the patient and updated documentation appropriately.        Comments   Patient states vision improved some OD. Had shunt surgery for glaucoma OD with Dr. Katy Fitch. Vision was terrible initially after surgery, but much better now. Using latanoprost qhs OU. Stopped using cosopt due to drop burning. Finished taper on Pred Forte.       Last edited by Bernarda Caffey, MD on 05/19/2022 12:14 PM.    Pt had carotid artery sx on 09.06.23  Referring physician: Sharilyn Sites, MD [place],   [id number]  HISTORICAL INFORMATION:  Selected notes from the MEDICAL RECORD NUMBER Referred by Dr. Madelin Headings for concern of SRF OD LEE: 11.27.20 (M. Cotter) [BCVA: OD: 20/80-- OS: 20/60-]  Ocular Hx-glaucoma (latanoprost)  PMH-DM    CURRENT MEDICATIONS: Current Outpatient Medications (Ophthalmic Drugs)  Medication Sig   latanoprost (XALATAN) 0.005 % ophthalmic solution Place 1 drop into both eyes at bedtime.   Polyethyl Glycol-Propyl Glycol (SYSTANE OP) Place 1 drop into the left eye daily as needed (dry eye).   dorzolamide-timolol (COSOPT) 22.3-6.8 MG/ML ophthalmic solution INSTILL 1 DROP INTO RIGHT EYE TWICE A DAY (Patient not taking: Reported on 03/05/2022)   prednisoLONE acetate (PRED FORTE) 1 % ophthalmic suspension Place 1 drop into the right eye 2 (two) times daily. Taper (Patient not taking: Reported on 05/19/2022)   No current facility-administered medications for this visit. (Ophthalmic Drugs)   Current Outpatient Medications (Other)  Medication Sig    albuterol (PROVENTIL HFA;VENTOLIN HFA) 108 (90 BASE) MCG/ACT inhaler Inhale 2 puffs into the lungs every 4 (four) hours as needed for shortness of breath.   Alirocumab (PRALUENT) 75 MG/ML SOAJ Inject 75 mg into the skin every 14 (fourteen) days.   amitriptyline (ELAVIL) 25 MG tablet Take 25 mg by mouth at bedtime.   anastrozole (ARIMIDEX) 1 MG tablet TAKE 1 TABLET BY MOUTH EVERY DAY (Patient taking differently: Take 1 mg by mouth at bedtime.)   carvedilol (COREG) 6.25 MG tablet Take 1 tablet (6.25 mg total) by mouth 2 (two) times daily.   cetirizine (ZYRTEC) 10 MG tablet Take 10 mg by mouth daily.   Cholecalciferol (VITAMIN D-3) 1000 units CAPS Take 1,000 Units by mouth daily.   diazepam (VALIUM) 2 MG tablet Take 2 mg by mouth 2 (two) times daily as needed for anxiety (dizziness).   esomeprazole (NEXIUM) 20 MG capsule Take 20 mg by mouth daily at 12 noon.   FARXIGA 5 MG TABS tablet Take 5 mg by mouth every morning.   furosemide (LASIX) 40 MG tablet TAKE 1 TABLET (40 MG TOTAL) BY MOUTH DAILY. MAY TAKE EXTRA DAILY AS NEEDED FOR SWELLING   gabapentin (NEURONTIN) 300 MG capsule Take 300 mg by mouth 3 (three) times daily.   glimepiride (AMARYL) 2 MG tablet Take 2 mg by mouth daily.   hydrALAZINE (APRESOLINE) 25 MG tablet TAKE ONE TABLET BY MOUTH AT BREAKFAST AND AT BEDTIME   isosorbide mononitrate (IMDUR) 30 MG 24 hr tablet  TAKE ONE TABLET BY MOUTH ONCE DAILY   meclizine (ANTIVERT) 25 MG tablet Take 25 mg by mouth 2 (two) times daily as needed for dizziness.   Multiple Vitamins-Minerals (PRESERVISION AREDS 2 PO) Take 1 capsule by mouth in the morning and at bedtime.   Omega-3 Fatty Acids (FISH OIL) 1200 MG CAPS Take 1,200 mg by mouth 2 (two) times daily.   polyethylene glycol (MIRALAX / GLYCOLAX) packet Take 17 g by mouth daily.   potassium chloride SA (KLOR-CON M) 20 MEQ tablet TAKE ONE-HALF TABLET BY  MOUTH DAILY   TRADJENTA 5 MG TABS tablet Take 5 mg by mouth daily.   vitamin B-12 (CYANOCOBALAMIN)  100 MCG tablet Take 100 mcg by mouth daily.   warfarin (COUMADIN) 2 MG tablet TAKE ONE TABLET BY MOUTH EVERYDAY AT BEDTIME AS DIRECTED by THE coumadin Clinic (Patient taking differently: Take 2 mg by mouth daily at 4 PM. AS DIRECTED by THE coumadin clinic)   oxyCODONE (OXY IR/ROXICODONE) 5 MG immediate release tablet Take 1-2 tablets (5-10 mg total) by mouth every 6 (six) hours as needed for moderate pain. (Patient not taking: Reported on 03/30/2022)   No current facility-administered medications for this visit. (Other)   REVIEW OF SYSTEMS: ROS   Positive for: Genitourinary, Endocrine, Cardiovascular, Eyes Negative for: Constitutional, Gastrointestinal, Neurological, Skin, Musculoskeletal, HENT, Respiratory, Psychiatric, Allergic/Imm, Heme/Lymph Last edited by Jobe Marker, COT on 05/19/2022  9:01 AM.      ALLERGIES Allergies  Allergen Reactions   Alphagan [Brimonidine] Itching   Diflunisal Swelling    Other reaction(s): ENTIRE BODY SWELLING   Vioxx [Rofecoxib] Shortness Of Breath   Metformin And Related     Kidney failure   Nexlizet [Bempedoic Acid-Ezetimibe]     Causes elevated Liver and Kidney function   Repatha [Evolocumab]     MYALGIAS   Codeine Rash   Elemental Sulfur Rash   Motrin [Ibuprofen] Rash   Penicillins Rash   Pravastatin Rash   PAST MEDICAL HISTORY Past Medical History:  Diagnosis Date   Antral gastritis    EGD 11/15   Asthmatic bronchitis    Back pain    Breast cancer ([place])    right breast   CAD in native artery 03/10/2021   Chronic diastolic heart failure ([place]) 05/20/2015   Grade 2 diastolic dysfunction.  04/2015.   Chronic kidney disease    kidney function low   Diabetes mellitus    x 5 yrs   DVT of axillary vein, acute left ([place]) 07/24/2012   GERD (gastroesophageal reflux disease)    Glaucoma    POAG OU   Heart murmur    History of hiatal hernia    History of kidney stones    Hyperlipidemia 05/20/2015   Hypertension    Hypertensive  retinopathy    OU   Hypothyroidism    Kidney stones    Macular degeneration    Wet OD, Dry OS   Mixed hyperlipidemia    OSA (obstructive sleep apnea) 12/17/2021   Peripheral venous insufficiency    Pinched nerve    right elbow   Pneumonia    PONV (postoperative nausea and vomiting)    Sigmoid diverticulitis    Snoring 03/10/2021   Vertigo    chonic   Past Surgical History:  Procedure Laterality Date   ABDOMINAL HYSTERECTOMY     BACK SURGERY     spinal    BREAST LUMPECTOMY WITH RADIOACTIVE SEED LOCALIZATION Right 12/03/2020   Procedure: RIGHT BREAST LUMPECTOMY WITH RADIOACTIVE SEED LOCALIZATION;  Surgeon: Donnie Mesa, MD;  Location: [facility];  Service: General;  Laterality: Right;   CARDIAC CATHETERIZATION N/A 05/26/2015   Procedure: Left Heart Cath and Coronary Angiography;  Surgeon: Jettie Booze, MD;  Location: [facility];  Service: Cardiovascular;  Laterality: N/A;   CATARACT EXTRACTION Bilateral    CHOLECYSTECTOMY     COLON SURGERY     COLONOSCOPY N/A 12/27/2013   Procedure: COLONOSCOPY;  Surgeon: Rogene Houston, MD;  Location: [facility];  Service: Endoscopy;  Laterality: N/A;  200   COLOSTOMY CLOSURE     ENDARTERECTOMY Left 03/10/2022   Procedure: LEFT CAROTID ENDARTERECTOMY;  Surgeon: Cherre Robins, MD;  Location[facility] OR;  Service: Vascular;  Laterality: Left;   ESOPHAGOGASTRODUODENOSCOPY N/A 05/07/2014   Procedure: ESOPHAGOGASTRODUODENOSCOPY (EGD);  Surgeon: Rogene Houston, MD;  Location: [facility];  Service: Endoscopy;  Laterality: N/A;   EYE SURGERY Bilateral    Cat Sx   fracture left foot     HERNIA REPAIR     NM MYOCAR PERF WALL MOTION  01/28/2009   Normal   OTHER SURGICAL HISTORY     colostomy, colostomy reversal, for diverticulitis surgical hernia repair, arm surgery, neck surgery   PATCH ANGIOPLASTY Left 03/10/2022   Procedure: PATCH ANGIOPLASTY WITH 1X6CM Weyman Pedro;  Surgeon: Cherre Robins, MD;  Location: [facility] OR;  Service: Vascular;   Laterality: Left;   US ECHOCARDIOGRAPHY  02/11/2010   Mild MR,trace TR & AI   FAMILY HISTORY Family History  Problem Relation Age of Onset   CVA Maternal Grandmother 90       deceased   Heart disease Maternal Grandmother    Stroke Maternal Grandmother    Breast cancer Maternal Grandmother    Other Mother 30       Cause unknown   Cancer Mother        liver   Heart attack Brother 4       deceased   Breast cancer Sister    Leukemia Maternal Aunt    Glaucoma Maternal Uncle    Bone cancer Maternal Uncle    Spina bifida Daughter    SOCIAL HISTORY Social History   Tobacco Use   Smoking status: Former    Types: Cigarettes    Quit date: 12/10/2013    Years since quitting: 8.4    Passive exposure: Never   Smokeless tobacco: Never   Tobacco comments:    Smoke 1-1 1/2 packs a day  Vaping Use   Vaping Use: Never used  Substance Use Topics   Alcohol use: No   Drug use: No       OPHTHALMIC EXAM: Base Eye Exam     Visual Acuity (Snellen - Linear)       Right Left   Dist [place] 20/40 20/40 -2   Dist ph [place] 20/30 -1 20/25         Tonometry (Tonopen, 9:16 AM)       Right Left   Pressure 15 21         Pupils       Dark Light Shape React APD   Right 2 1 Round Brisk None   Left 2 1 Round Brisk None         Visual Fields (Counting fingers)       Left Right    Full Full         Extraocular Movement       Right Left    Full, Ortho Full,  Ortho         Neuro/Psych     Oriented x3: Yes   Mood/Affect: Normal         Dilation     Both eyes: 1.0% Mydriacyl, 2.5% Phenylephrine @ 9:16 AM           Slit Lamp and Fundus Exam     Slit Lamp Exam       Right Left   Lids/Lashes Dermatochalasis - upper lid, mild Meibomian gland dysfunction Dermatochalasis - upper lid, Telangiectasia, mild Meibomian gland dysfunction   Conjunctiva/Sclera White and quiet, superior bleb White and quiet   Cornea 1+Punctate epethelial erosions, well healed cataract wound  1+ fine Punctate epithelial erosions, arcus   Anterior Chamber narrow temporal angle, tube at 1200 Deep and quiet, narrow temporal angle   Iris round and poorly dilated Round and moderately dilated to 5.43m   Lens Posterior chamber intraocular lens, open PC Posterior chamber intraocular lens   Anterior Vitreous Vitreous syneresis, Posterior vitreous detachment Vitreous syneresis         Fundus Exam       Right Left   Disc Sharp rim, +Pallor, +cupping w/ superior and inferior rim thinning, temporal Peripapillary atrophy mild Pallor, Sharp rim, temporal Peripapillary atrophy   C/D Ratio 0.9 0.5   Macula Flat, Blunted foveal reflex, +focal CNV/PED nasal macula with partial pigment ring, trace IRF/ SRF, Drusen, RPE mottling and clumping, no heme Flat, Blunted foveal reflex, fine drusen, mild ERM, Retinal pigment epithelial mottling and clumping, No heme or edema   Vessels attenuated, Tortuous attenuated, Tortuous   Periphery Attached, mild reticular degeneration, No heme, No RT/RD Attached; no heme           IMAGING AND PROCEDURES  Imaging and Procedures for '@TODAY'$ @  OCT, Retina - OU - Both Eyes       Right Eye Quality was good. Central Foveal Thickness: 192. Progression has been stable. Findings include normal foveal contour, retinal drusen , subretinal hyper-reflective material, intraretinal hyper-reflective material, intraretinal fluid, pigment epithelial detachment, subretinal fluid, outer retinal atrophy (Persistent focal IRF/SRF overlying nasal PED / CNV; partial PVD).   Left Eye Quality was good. Central Foveal Thickness: 209. Progression has been stable. Findings include normal foveal contour, no IRF, no SRF, retinal drusen (Partial PVD, patchy ORA).   Notes *Images captured and stored on drive  Diagnosis / Impression:  OD: exudative ARMD; Persistent focal IRF/SRF overlying nasal PED / CNV; partial PVD OS: NFP, no IRF/SRF; +drusen -- nonexudative ARMD  Clinical  management:  See below  Abbreviations: NFP - Normal foveal profile. CME - cystoid macular edema. PED - pigment epithelial detachment. IRF - intraretinal fluid. SRF - subretinal fluid. EZ - ellipsoid zone. ERM - epiretinal membrane. ORA - outer retinal atrophy. ORT - outer retinal tubulation. SRHM - subretinal hyper-reflective material      Intravitreal Injection, Pharmacologic Agent - OD - Right Eye       Time Out 05/19/2022. 10:08 AM. Confirmed correct patient, procedure, site, and patient consented.   Anesthesia Topical anesthesia was used. Anesthetic medications included Lidocaine 2%, Proparacaine 0.5%.   Procedure Preparation included 5% betadine to ocular surface, eyelid speculum. A supplied needle was used.   Injection: 1.25 mg Bevacizumab 1.'25mg'$ /0.033m  Route: Intravitreal, Site: Right Eye   NDC: [id number]: : [id number] date: 06/20/2022   Post-op Post injection exam found visual acuity of at least counting fingers. The patient tolerated the procedure well. There were no complications. The patient received written  and verbal post procedure care education. Post injection medications were not given.            ASSESSMENT/PLAN:   ICD-10-CM   1. Exudative age-related macular degeneration of right eye with active choroidal neovascularization (HCC)  H35.3211 OCT, Retina - OU - Both Eyes    Intravitreal Injection, Pharmacologic Agent - OD - Right Eye    2. Intermediate stage nonexudative age-related macular degeneration of left eye  H35.3122     3. Diabetes mellitus type 2 without retinopathy ([place])  E11.9     4. Essential hypertension  I10     5. Hypertensive retinopathy of both eyes  H35.033     6. Pseudophakia of both eyes  Z96.1     7. PCO (posterior capsular opacification), right  H26.491     8. Primary open angle glaucoma of both eyes, unspecified glaucoma stage  H40.1130      1. 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, OD  - h/o delayed  f/u from 8 wks to 12 on 6.20.22 due to lumpectomy -- dx'd w/ DCIS  - h/o delayed follow up from 4 weeks to 8 weeks due to passing of husband (12.11.20-02.12.21)   - s/p IVA OD #1 (12.11.20), #2 (02.12.21), #3 (03.12.21), #4 (04.09.21), #5 (05.14.21), #6 (06.17.21), #7 (07.23.21), #8 (10.15.21), #9 (12.7.21), #10 (03.22.22), #11 (06.20.22), #12 (08.24.22), #13 (10.31.22), #14 (01.03.23), #15 (08.15.23)  - FA 12.11.20 confirms +CNVM  **history of increased fluid at 7+ months noted on 08.15.23**  - OCT today shows Persistent focal IRF/SRF overlying nasal PED / CNV; partial PVD at 3 months  - BCVA OD stable at 20/40   - recommend IVA OD #16 today, 11.15.23 with follow up back to 8 weeks  - pt wishes to proceed with injection  - RBA of procedure discussed, questions answered - informed consent obtained and signed - see procedure note  - Avastin informed consent form re-signed and scanned on 08.15.2023 (OD)             - Continue to use AT OD  - f/u 8 weeks -- DFE/OCT/possible injection  2. Age related macular degeneration, non-exudative, OS  - intermediate stage  - The incidence, anatomy, and pathology of dry AMD, risk of progression, and the AREDS and AREDS 2 study including smoking risks discussed with patient.   - recommend Amsler grid monitoring  3. Diabetes mellitus, type 2 without retinopathy  - The incidence, risk factors for progression, natural history and treatment options for diabetic retinopathy  were discussed with patient.    - The need for close monitoring of blood glucose, blood pressure, and serum lipids, avoiding cigarette or any type of tobacco, and the need for long term follow up was also discussed with patient.  - monitor   4,5. Hypertensive retinopathy OU  - discussed importance of tight BP control  - monitor   6,7. Pseudophakia OU, PCO OD  - s/p CE/IOL OU (Dr. Venetia Maxon)  - IOL in good position  - s/p yag cap OD (04.06.22) -- good PC opening  - monitor   8. POAG  OU  - formerly managed by Dr. Venetia Maxon -- now following at [facility]  - s/p laser w/ Dr. Venetia Maxon -- ?SLT  - s/p trab OD w/ Dr. Loletha Grayer. [place]  - IOP 15, 21 today  - currently on latanoprost QHS  Ophthalmic Meds Ordered this visit:  No orders of the defined types were placed in this encounter.    Return in about  8 weeks (around 07/14/2022) for f/u exu ARMD OD, DFE, OCT.  There are no Patient Instructions on file for this visit.  This document serves as a record of services personally performed by Gardiner Sleeper, MD, PhD. It was created on their behalf by Orvan Falconer, an ophthalmic technician. The creation of this record is the provider's dictation and/or activities during the visit.    Electronically signed by: Orvan Falconer, OA, 05/19/22  12:19 PM  This document serves as a record of services personally performed by Gardiner Sleeper, MD, PhD. It was created on their behalf by San Jetty. Owens Shark, OA an ophthalmic technician. The creation of this record is the provider's dictation and/or activities during the visit.    Electronically signed by: San Jetty. Owens Shark, [place] 11.15.2023 12:19 PM  Gardiner Sleeper, M.D., Ph.D. Diseases & Surgery of the Retina and Vitreous [facility]  I have reviewed the above documentation for accuracy and completeness, and I agree with the above. Gardiner Sleeper, M.D., Ph.D. 05/19/22 12:20 PM   Abbreviations: M myopia (nearsighted); A astigmatism; H hyperopia (farsighted); P presbyopia; Mrx spectacle prescription;  CTL contact lenses; OD right eye; OS left eye; OU both eyes  XT exotropia; ET esotropia; PEK punctate epithelial keratitis; PEE punctate epithelial erosions; DES dry eye syndrome; MGD meibomian gland dysfunction; ATs artificial tears; PFAT's preservative free artificial tears; [place] nuclear sclerotic cataract; PSC posterior subcapsular cataract; ERM epi-retinal membrane; PVD posterior vitreous detachment; RD retinal detachment; DM  diabetes mellitus; DR diabetic retinopathy; NPDR non-proliferative diabetic retinopathy; PDR proliferative diabetic retinopathy; CSME clinically significant macular edema; DME diabetic macular edema; dbh dot blot hemorrhages; CWS cotton wool spot; POAG primary open angle glaucoma; C/D cup-to-disc ratio; HVF humphrey visual field; GVF goldmann visual field; OCT optical coherence tomography; IOP intraocular pressure; BRVO Branch retinal vein occlusion; CRVO central retinal vein occlusion; CRAO central retinal artery occlusion; BRAO branch retinal artery occlusion; RT retinal tear; SB scleral buckle; PPV pars plana vitrectomy; VH Vitreous hemorrhage; PRP panretinal laser photocoagulation; IVK intravitreal kenalog; VMT vitreomacular traction; MH Macular hole;  NVD neovascularization of the disc; NVE neovascularization elsewhere; AREDS age related eye disease study; ARMD age related macular degeneration; POAG primary open angle glaucoma; EBMD epithelial/anterior basement membrane dystrophy; ACIOL anterior chamber intraocular lens; IOL intraocular lens; PCIOL posterior chamber intraocular lens; Phaco/IOL phacoemulsification with intraocular lens placement; [place] photorefractive keratectomy; LASIK laser assisted in situ keratomileusis; HTN hypertension; DM diabetes mellitus; COPD chronic obstructive pulmonary disease

## 2022-05-19 ENCOUNTER — Ambulatory Visit (INDEPENDENT_AMBULATORY_CARE_PROVIDER_SITE_OTHER): Payer: HMO | Admitting: Ophthalmology

## 2022-05-19 ENCOUNTER — Encounter (INDEPENDENT_AMBULATORY_CARE_PROVIDER_SITE_OTHER): Payer: Self-pay | Admitting: Ophthalmology

## 2022-05-19 DIAGNOSIS — H35033 Hypertensive retinopathy, bilateral: Secondary | ICD-10-CM | POA: Diagnosis not present

## 2022-05-19 DIAGNOSIS — H353211 Exudative age-related macular degeneration, right eye, with active choroidal neovascularization: Secondary | ICD-10-CM | POA: Diagnosis not present

## 2022-05-19 DIAGNOSIS — H401130 Primary open-angle glaucoma, bilateral, stage unspecified: Secondary | ICD-10-CM

## 2022-05-19 DIAGNOSIS — I10 Essential (primary) hypertension: Secondary | ICD-10-CM | POA: Diagnosis not present

## 2022-05-19 DIAGNOSIS — H353122 Nonexudative age-related macular degeneration, left eye, intermediate dry stage: Secondary | ICD-10-CM | POA: Diagnosis not present

## 2022-05-19 DIAGNOSIS — H26491 Other secondary cataract, right eye: Secondary | ICD-10-CM

## 2022-05-19 DIAGNOSIS — E119 Type 2 diabetes mellitus without complications: Secondary | ICD-10-CM

## 2022-05-19 DIAGNOSIS — Z961 Presence of intraocular lens: Secondary | ICD-10-CM

## 2022-05-19 MED ORDER — BEVACIZUMAB CHEMO INJECTION 1.25MG/0.05ML SYRINGE FOR KALEIDOSCOPE
1.2500 mg | INTRAVITREAL | Status: AC | PRN
  Administered 2022-05-19: 1.25 mg via INTRAVITREAL

## 2022-05-24 ENCOUNTER — Ambulatory Visit: Payer: HMO | Attending: Cardiology | Admitting: *Deleted

## 2022-05-24 DIAGNOSIS — Z5181 Encounter for therapeutic drug level monitoring: Secondary | ICD-10-CM | POA: Diagnosis not present

## 2022-05-24 DIAGNOSIS — I82A12 Acute embolism and thrombosis of left axillary vein: Secondary | ICD-10-CM | POA: Diagnosis not present

## 2022-05-24 LAB — POCT INR: INR: 2.4 (ref 2.0–3.0)

## 2022-05-24 NOTE — Patient Instructions (Signed)
S/P Lt CEA on 03/10/22   Continue warfarin 1 tablet daily   Recheck INR in 4 wk.  Call Coumadin clinic for any questions or changes in medications.

## 2022-05-31 ENCOUNTER — Other Ambulatory Visit (HOSPITAL_BASED_OUTPATIENT_CLINIC_OR_DEPARTMENT_OTHER): Payer: Self-pay | Admitting: Cardiovascular Disease

## 2022-05-31 DIAGNOSIS — I825Z9 Chronic embolism and thrombosis of unspecified deep veins of unspecified distal lower extremity: Secondary | ICD-10-CM

## 2022-05-31 DIAGNOSIS — Z5181 Encounter for therapeutic drug level monitoring: Secondary | ICD-10-CM

## 2022-05-31 NOTE — Telephone Encounter (Signed)
Warfarin refill Last INR 05/24/22 Last OV 12/17/21

## 2022-06-06 ENCOUNTER — Other Ambulatory Visit: Payer: Self-pay

## 2022-06-06 ENCOUNTER — Telehealth: Payer: Self-pay | Admitting: Emergency Medicine

## 2022-06-06 ENCOUNTER — Ambulatory Visit
Admission: EM | Admit: 2022-06-06 | Discharge: 2022-06-06 | Disposition: A | Discharge status: Home / Self Care | Payer: HMO | Attending: Family Medicine | Admitting: Family Medicine

## 2022-06-06 ENCOUNTER — Encounter: Payer: Self-pay | Admitting: Emergency Medicine

## 2022-06-06 DIAGNOSIS — Z1152 Encounter for screening for COVID-19: Secondary | ICD-10-CM | POA: Insufficient documentation

## 2022-06-06 DIAGNOSIS — Z8701 Personal history of pneumonia (recurrent): Secondary | ICD-10-CM | POA: Insufficient documentation

## 2022-06-06 DIAGNOSIS — J4521 Mild intermittent asthma with (acute) exacerbation: Secondary | ICD-10-CM | POA: Diagnosis not present

## 2022-06-06 DIAGNOSIS — J069 Acute upper respiratory infection, unspecified: Secondary | ICD-10-CM | POA: Diagnosis present

## 2022-06-06 LAB — RESP PANEL BY RT-PCR (FLU A&B, COVID) ARPGX2
Influenza A by PCR: NEGATIVE
Influenza B by PCR: NEGATIVE
SARS Coronavirus 2 by RT PCR: NEGATIVE

## 2022-06-06 MED ORDER — PROMETHAZINE-DM 6.25-15 MG/5ML PO SYRP: 5.0000 mL | ORAL_SOLUTION | Freq: Four times a day (QID) | ORAL | 0 refills | Status: DC | PRN

## 2022-06-06 MED ORDER — DOXYCYCLINE HYCLATE 100 MG PO CAPS: 100.0000 mg | ORAL_CAPSULE | Freq: Two times a day (BID) | ORAL | 0 refills | Status: DC

## 2022-06-06 MED ORDER — FLUTICASONE PROPIONATE HFA 110 MCG/ACT IN AERO: 1.0000 | INHALATION_SPRAY | Freq: Two times a day (BID) | RESPIRATORY_TRACT | 0 refills | Status: DC

## 2022-06-06 NOTE — Telephone Encounter (Signed)
Pt reported pharmacy was closed on Sunday and would like prescriptions sent to [facility] in [place]. Pt aware electronically sent over.

## 2022-06-06 NOTE — ED Triage Notes (Signed)
Pt reports woke up hoarse on Wednesday and reports cough started Friday and reports cough is progressively getting worse.  Denies fever and intermittent productive cough. Reports pain is only with coughing. NAD noted.

## 2022-06-06 NOTE — Discharge Instructions (Signed)
Follow-up with whoever manages your warfarin first thing next week to discuss monitoring your levels while you are on antibiotics.

## 2022-06-06 NOTE — ED Provider Notes (Signed)
[facility]    CSN: [id number] Arrival date & time: 06/06/22  1027      History   Chief Complaint Chief Complaint  Patient presents with   Cough    HPI [name] is a 81 y.o. female.   Presenting today with 5-day history of progressively worsening productive cough, wheezing, chest tightness, congestion, sore throat.  Denies known fever, chills, body aches, abdominal pain, nausea vomiting or diarrhea.  So far trying some over-the-counter cough and congestion medication with minimal relief.  She has a history of asthma on albuterol nebulizer and inhaler, states these are barely helping.  States she is prone to pneumonia.    Past Medical History:  Diagnosis Date   Antral gastritis    EGD 11/15   Asthmatic bronchitis    Back pain    Breast cancer ([place])    right breast   CAD in native artery 03/10/2021   Chronic diastolic heart failure ([place]) 05/20/2015   Grade 2 diastolic dysfunction.  04/2015.   Chronic kidney disease    kidney function low   Diabetes mellitus    x 5 yrs   DVT of axillary vein, acute left ([place]) 07/24/2012   GERD (gastroesophageal reflux disease)    Glaucoma    POAG OU   Heart murmur    History of hiatal hernia    History of kidney stones    Hyperlipidemia 05/20/2015   Hypertension    Hypertensive retinopathy    OU   Hypothyroidism    Kidney stones    Macular degeneration    Wet OD, Dry OS   Mixed hyperlipidemia    OSA (obstructive sleep apnea) 12/17/2021   Peripheral venous insufficiency    Pinched nerve    right elbow   Pneumonia    PONV (postoperative nausea and vomiting)    Sigmoid diverticulitis    Snoring 03/10/2021   Vertigo    chonic    Patient Active Problem List   Diagnosis Date Noted   Carotid stenosis 03/10/2022   Carotid stenosis, asymptomatic 03/10/2022   OSA (obstructive sleep apnea) 12/17/2021   CAD in native artery 03/10/2021   Snoring 03/10/2021   Ductal carcinoma in situ (DCIS) of right breast  12/29/2020   Long term (current) use of anticoagulants 06/24/2020   Medication management 07/01/2016   Abnormal nuclear stress test    Chronic diastolic heart failure ([place]) 05/20/2015   Hyperlipidemia 05/20/2015   Annual physical exam 06/13/2014   Antral gastritis 05/07/2014   Anemia 05/06/2014   Renal failure 05/06/2014   Type 2 diabetes mellitus without complication ([place])    RUQ pain 05/03/2014   Rectal bleeding 12/10/2013   Pelvic mass in female 11/06/2013   Epistaxis 08/19/2012   DVT (deep venous thrombosis) ([place]) 08/19/2012   DM (diabetes mellitus) ([place]) 08/19/2012   HTN (hypertension) 08/19/2012    Past Surgical History:  Procedure Laterality Date   ABDOMINAL HYSTERECTOMY     BACK SURGERY     spinal    BREAST LUMPECTOMY WITH RADIOACTIVE SEED LOCALIZATION Right 12/03/2020   Procedure: RIGHT BREAST LUMPECTOMY WITH RADIOACTIVE SEED LOCALIZATION;  Surgeon: Donnie Mesa, MD;  Location: [facility];  Service: General;  Laterality: Right;   CARDIAC CATHETERIZATION N/A 05/26/2015   Procedure: Left Heart Cath and Coronary Angiography;  Surgeon: Jettie Booze, MD;  Location: [facility];  Service: Cardiovascular;  Laterality: N/A;   CATARACT EXTRACTION Bilateral    CHOLECYSTECTOMY     COLON SURGERY  COLONOSCOPY N/A 12/27/2013   Procedure: COLONOSCOPY;  Surgeon: Rogene Houston, MD;  [facility]: [facility];  Service: Endoscopy;  Laterality: N/A;  200   COLOSTOMY CLOSURE     ENDARTERECTOMY Left 03/10/2022   Procedure: LEFT CAROTID ENDARTERECTOMY;  Surgeon: Cherre Robins, MD;  Location: [facility];  Service: Vascular;  Laterality: Left;   ESOPHAGOGASTRODUODENOSCOPY N/A 05/07/2014   Procedure: ESOPHAGOGASTRODUODENOSCOPY (EGD);  Surgeon: Rogene Houston, MD;  [facility];  Service: Endoscopy;  Laterality: N/A;   EYE SURGERY Bilateral    Cat Sx   fracture left foot     HERNIA REPAIR     NM MYOCAR PERF WALL MOTION  01/28/2009   Normal   OTHER SURGICAL HISTORY      colostomy, colostomy reversal, for diverticulitis surgical hernia repair, arm surgery, neck surgery   PATCH ANGIOPLASTY Left 03/10/2022   Procedure: PATCH ANGIOPLASTY WITH 1X6CM Weyman Pedro;  Surgeon: Cherre Robins, MD;  Location: [facility];  Service: Vascular;  Laterality: Left;   US ECHOCARDIOGRAPHY  02/11/2010   Mild MR,trace TR & AI    OB History     Gravida  3   Para  3   Term      Preterm      AB      Living         SAB      IAB      Ectopic      Multiple      Live Births               Home Medications    Prior to Admission medications   Medication Sig Start Date End Date Taking? Authorizing Provider  albuterol (PROVENTIL HFA;VENTOLIN HFA) 108 (90 BASE) MCG/ACT inhaler Inhale 2 puffs into the lungs every 4 (four) hours as needed for shortness of breath. 06/04/15   Skeet Latch, MD  amitriptyline (ELAVIL) 25 MG tablet Take 25 mg by mouth at bedtime.    [provider]  anastrozole (ARIMIDEX) 1 MG tablet TAKE 1 TABLET BY MOUTH EVERY DAY Patient taking differently: Take 1 mg by mouth at bedtime. 12/07/21   [name], MD  carvedilol (COREG) 6.25 MG tablet Take 1 tablet (6.25 mg total) by mouth 2 (two) times daily. 03/02/22   Skeet Latch, MD  cetirizine (ZYRTEC) 10 MG tablet Take 10 mg by mouth daily.    [provider]  Cholecalciferol (VITAMIN D-3) 1000 units CAPS Take 1,000 Units by mouth daily.    [provider]  diazepam (VALIUM) 2 MG tablet Take 2 mg by mouth 2 (two) times daily as needed for anxiety (dizziness). 04/10/20   [provider]  dorzolamide-timolol (COSOPT) 22.3-6.8 MG/ML ophthalmic solution INSTILL 1 DROP INTO RIGHT EYE TWICE A DAY Patient not taking: Reported on 03/05/2022 08/28/20   Bernarda Caffey, MD  doxycycline (VIBRAMYCIN) 100 MG capsule Take 1 capsule (100 mg total) by mouth 2 (two) times daily. 06/06/22   Volney American, PA-C  esomeprazole (NEXIUM) 20 MG capsule Take 20 mg by mouth  daily at 12 noon.    [provider]  FARXIGA 5 MG TABS tablet Take 5 mg by mouth every morning. 10/01/20   [provider]  fluticasone (FLOVENT HFA) 110 MCG/ACT inhaler Inhale 1 puff into the lungs 2 (two) times daily. Rinse mouth with water after each use 06/06/22   Volney American, PA-C  furosemide (LASIX) 40 MG tablet TAKE 1 TABLET (40 MG TOTAL) BY MOUTH DAILY.  MAY TAKE EXTRA DAILY AS NEEDED FOR SWELLING 04/15/21   Skeet Latch, MD  gabapentin (NEURONTIN) 300 MG capsule Take 300 mg by mouth 3 (three) times daily. 05/08/19   [provider]  glimepiride (AMARYL) 2 MG tablet Take 2 mg by mouth daily. 08/26/17   [provider]  hydrALAZINE (APRESOLINE) 25 MG tablet TAKE ONE TABLET BY MOUTH AT [facility] AND AT BEDTIME 04/29/22   Arnoldo Lenis, MD  isosorbide mononitrate (IMDUR) 30 MG 24 hr tablet TAKE ONE TABLET BY MOUTH ONCE DAILY 02/08/22   Skeet Latch, MD  latanoprost (XALATAN) 0.005 % ophthalmic solution Place 1 drop into both eyes at bedtime.    [provider]  meclizine (ANTIVERT) 25 MG tablet Take 25 mg by mouth 2 (two) times daily as needed for dizziness.    [provider]  Multiple Vitamins-Minerals (PRESERVISION AREDS 2 PO) Take 1 capsule by mouth in the morning and at bedtime.    [provider]  Omega-3 Fatty Acids (FISH OIL) 1200 MG CAPS Take 1,200 mg by mouth 2 (two) times daily.    [provider]  oxyCODONE (OXY IR/ROXICODONE) 5 MG immediate release tablet Take 1-2 tablets (5-10 mg total) by mouth every 6 (six) hours as needed for moderate pain. Patient not taking: Reported on 03/30/2022 03/13/22   Karoline Caldwell, PA-C  Polyethyl Glycol-Propyl Glycol (SYSTANE OP) Place 1 drop into the left eye daily as needed (dry eye).    [provider]  polyethylene glycol (MIRALAX / GLYCOLAX) packet Take 17 g by mouth daily.    [provider]  potassium chloride SA (KLOR-CON M) 20 MEQ  tablet TAKE ONE-HALF TABLET BY  MOUTH DAILY 08/11/21   Skeet Latch, MD  PRALUENT 75 MG/ML SOAJ INJECT 75 MG into THE SKIN EVERY 14 DAYS 05/31/22   Skeet Latch, MD  prednisoLONE acetate (PRED FORTE) 1 % ophthalmic suspension Place 1 drop into the right eye 2 (two) times daily. Taper Patient not taking: Reported on 05/19/2022 01/22/22   [provider]  promethazine-dextromethorphan (PROMETHAZINE-DM) 6.25-15 MG/5ML syrup Take 5 mLs by mouth 4 (four) times daily as needed. 06/06/22   Volney American, PA-C  TRADJENTA 5 MG TABS tablet Take 5 mg by mouth daily. 10/01/20   [provider]  vitamin B-12 (CYANOCOBALAMIN) 100 MCG tablet Take 100 mcg by mouth daily. 03/04/14   [provider]  warfarin (COUMADIN) 2 MG tablet TAKE ONE TABLET BY MOUTH EVERYDAY AT BEDTIME AS DIRECTED by THE coumadin Clinic 05/31/22   Skeet Latch, MD    Family History Family History  Problem Relation Age of Onset   CVA Maternal Grandmother 90       deceased   Heart disease Maternal Grandmother    Stroke Maternal Grandmother    Breast cancer Maternal Grandmother    Other Mother 53       Cause unknown   Cancer Mother        liver   Heart attack Brother 98       deceased   Breast cancer Sister    Leukemia Maternal Aunt    Glaucoma Maternal Uncle    Bone cancer Maternal Uncle    Spina bifida Daughter     Social History Social History   Tobacco Use   Smoking status: Former    Types: Cigarettes    Quit date: 12/10/2013    Years since quitting: 8.4    Passive exposure: Never   Smokeless tobacco: Never   Tobacco comments:  Smoke 1-1 1/2 packs a day  Vaping Use   Vaping Use: Never used  Substance Use Topics   Alcohol use: No   Drug use: No     Allergies   Alphagan [brimonidine], Diflunisal, Vioxx [rofecoxib], Metformin and related, Nexlizet [bempedoic acid-ezetimibe], Repatha [evolocumab], Codeine, Elemental sulfur, Motrin [ibuprofen], Penicillins, and  Pravastatin   Review of Systems Review of Systems Per HPI  Physical Exam Triage Vital Signs ED Triage Vitals  Enc Vitals Group     BP 06/06/22 1209 (!) 175/80     Pulse Rate 06/06/22 1209 87     Resp 06/06/22 1209 20     Temp 06/06/22 1209 97.7 F (36.5 C)     Temp Source 06/06/22 1209 Oral     SpO2 06/06/22 1209 96 %     Weight --      Height --      Head Circumference --      Peak Flow --      Pain Score 06/06/22 1210 0     Pain Loc --      Pain Edu? --      Excl. in [place]? --    No data found.  Updated Vital Signs BP (!) 175/80 (BP Location: Right Arm)   Pulse 87   Temp 97.7 F (36.5 C) (Oral)   Resp 20   SpO2 96% Comment: ranges 92-96% in triage.  Visual Acuity Right Eye Distance:   Left Eye Distance:   Bilateral Distance:    Right Eye Near:   Left Eye Near:    Bilateral Near:     Physical Exam Vitals and nursing note reviewed.  Constitutional:      Appearance: Normal appearance.  HENT:     Head: Atraumatic.     Right Ear: Tympanic membrane and external ear normal.     Left Ear: Tympanic membrane and external ear normal.     Nose: Congestion present.     Mouth/Throat:     Mouth: Mucous membranes are moist.     Pharynx: Posterior oropharyngeal erythema present.  Eyes:     Extraocular Movements: Extraocular movements intact.     Conjunctiva/sclera: Conjunctivae normal.  Cardiovascular:     Rate and Rhythm: Normal rate and regular rhythm.     Heart sounds: Normal heart sounds.  Pulmonary:     Effort: Pulmonary effort is normal. No respiratory distress.     Breath sounds: Wheezing present.  Musculoskeletal:        General: Normal range of motion.     Cervical back: Normal range of motion and neck supple.  Skin:    General: Skin is warm and dry.  Neurological:     Mental Status: She is alert and oriented to person, place, and time.  Psychiatric:        Mood and Affect: Mood normal.        Thought Content: Thought content normal.      UC  Treatments / Results  Labs (all labs ordered are listed, but only abnormal results are displayed) Labs Reviewed  RESP PANEL BY RT-PCR (FLU A&B, COVID) [id number]    EKG   Radiology No results found.  Procedures Procedures (including critical care time)  Medications Ordered in UC Medications - No data to display  Initial Impression / Assessment and Plan / UC Course  I have reviewed the triage vital signs and the nursing notes.  Pertinent labs & imaging results that were available during my care of the patient were  reviewed by me and considered in my medical decision making (see chart for details).     Given progression, worsening course, chronic conditions concerned about asthma exacerbation leading into pneumonia.  Will cover with doxycycline as well as Phenergan DM, Flonase, Mucinex for symptomatic benefit.  COVID and flu testing pending for rule out.  Return for any worsening symptoms.  Did discuss close follow-up with PCP to monitor INR's while on antibiotics.  Final Clinical Impressions(s) / UC Diagnoses   Final diagnoses:  Viral URI with cough  Mild intermittent asthma with acute exacerbation  History of pneumonia     Discharge Instructions      Follow-up with whoever manages your warfarin first thing next week to discuss monitoring your levels while you are on antibiotics.    ED Prescriptions     Medication Sig Dispense Auth. Provider   fluticasone (FLOVENT HFA) 110 MCG/ACT inhaler Inhale 1 puff into the lungs 2 (two) times daily. Rinse mouth with water after each use 1 each Volney American, PA-C   doxycycline (VIBRAMYCIN) 100 MG capsule Take 1 capsule (100 mg total) by mouth 2 (two) times daily. 14 capsule Volney American, [place]   promethazine-dextromethorphan (PROMETHAZINE-DM) 6.25-15 MG/5ML syrup Take 5 mLs by mouth 4 (four) times daily as needed. 100 mL Volney American, [place]      PDMP not reviewed this encounter.   Volney American, [place] 06/06/22 1344

## 2022-06-07 ENCOUNTER — Ambulatory Visit (INDEPENDENT_AMBULATORY_CARE_PROVIDER_SITE_OTHER): Payer: HMO | Admitting: Cardiovascular Disease

## 2022-06-07 ENCOUNTER — Encounter (HOSPITAL_BASED_OUTPATIENT_CLINIC_OR_DEPARTMENT_OTHER): Payer: Self-pay | Admitting: Cardiovascular Disease

## 2022-06-07 VITALS — BP 122/82 | HR 80 | Ht 62.0 in | Wt 160.0 lb

## 2022-06-07 DIAGNOSIS — I825Z9 Chronic embolism and thrombosis of unspecified deep veins of unspecified distal lower extremity: Secondary | ICD-10-CM

## 2022-06-07 DIAGNOSIS — I6522 Occlusion and stenosis of left carotid artery: Secondary | ICD-10-CM

## 2022-06-07 DIAGNOSIS — I2510 Atherosclerotic heart disease of native coronary artery without angina pectoris: Secondary | ICD-10-CM | POA: Diagnosis not present

## 2022-06-07 DIAGNOSIS — I5032 Chronic diastolic (congestive) heart failure: Secondary | ICD-10-CM

## 2022-06-07 NOTE — Assessment & Plan Note (Signed)
She is euvolemic and doing well.  BP is well-controlled.  Continue carvedilol, Farxiga, lasix, and Imdur.

## 2022-06-07 NOTE — Progress Notes (Signed)
Cardiology Office Note  Date:  06/07/2022   ID:  [name], DOB Jul 11, 1940, MRN [id number]  PCP:  Darlene Sites, MD  Cardiologist:  Darlene Latch, MD  No chief complaint on file.  History of Present Illness: [name] is a 81 y.o. female with CAD (50% LAD), chronic diastolic heart failure (grade 2), hypertension, hyperlipidemia, prior DVT on warfarin, breast cancer s/p lumpectomy and XRT, and diabetes type 2 who presents for follow up.  She was first seen 04/2015 at which time she reported occasional chest pain.  She had an exercise Myoview 04/2015 that showed LVEF 78% with a small defect of moderate severity in the mid anterior and apical anterior region.  This was felt to be due to breast attenuation artifact.  However, she underwent cardiac catheterization on 05/26/15 that revealed a 50% LAD lesion. There was concern that there may be a component of vasospasm so long-acting nitrates were started. Her blood pressure and hyperlipidemia medications have been titrated due to poor control.  She is also on Lasix due to lower extremity edema.  [name]' husband died of COVID double pneumonia on 07/2019.  He was treated with Remdesivir at [facility] outpatient infusion center.  She was experiencing atypical chest pain.  She was referred for [facility] that revealed LVEF 74% with no ischemia.  Since then she called our office with concern for dizziness.  She spoke with our pharmacist and was advised to stop her hydralazine.  However her dizziness did not improve and it was not thought to be due to the medication or her blood pressure.  She had an episode while sitting on the toilet had to grab the garbage can to vomit.  They recommended that she see her PCP.  She had vertigo in the past and has used meclizine.  Her LFTs were elevated so her PCP stopped her pravastatin.  She since started Praluent.   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  She underwent lumpectomy and XRT.  She had a sleep study 03/2021 which showed mild sleep apnea. Given underling medical conditions, it was recommended she start an oral airway device or a CPAP.  She was admitted 07/2021 with the flu and sinusitis.    At the last visit, she was Volume overloaded, advised to take Lasix and wear extra compression socks. She followed up on 8/23 and a CTA was recommended which confirmed severe stenosis in the left common carotid and ICA. She underwent carotid endarterectomy 03/24/22. She was seen in the ED yesterday with cough and wheezing. Viral panel is negative.  She has been doing well overall. She has been experiencing occasional LE edema but denied any shortness of breath or chest pain. She is still experiencing some wheezing and cough. She noted that the past few days, she hasn't been very hungry. She was experiencing laryngitis symptoms all week and endorsed that on Saturday night, she was coughing constantly at night. She was told in the ED that she has a viral URI.  She has not been exercising as much lately.  However when she does she has no exertional symptoms.   Past Medical History:  Diagnosis Date   Antral gastritis    EGD 11/15   Asthmatic bronchitis    Back pain    Breast cancer ([place])    right breast   CAD in native artery 03/10/2021   Chronic diastolic heart failure ([name]) 05/20/2015   Grade 2 diastolic dysfunction.  04/2015.  Chronic kidney disease    kidney function low   Diabetes mellitus    x 5 yrs   DVT of axillary vein, acute left ([name]) 07/24/2012   GERD (gastroesophageal reflux disease)    Glaucoma    POAG OU   Heart murmur    History of hiatal hernia    History of kidney stones    Hyperlipidemia 05/20/2015   Hypertension    Hypertensive retinopathy    OU   Hypothyroidism    Kidney stones    Macular degeneration    Wet OD, Dry OS   Mixed hyperlipidemia    OSA (obstructive sleep apnea) 12/17/2021   Peripheral venous insufficiency    Pinched nerve    right elbow    Pneumonia    PONV (postoperative nausea and vomiting)    Sigmoid diverticulitis    Snoring 03/10/2021   Vertigo    chonic    Past Surgical History:  Procedure Laterality Date   ABDOMINAL HYSTERECTOMY     BACK SURGERY     spinal    BREAST LUMPECTOMY WITH RADIOACTIVE SEED LOCALIZATION Right 12/03/2020   Procedure: RIGHT BREAST LUMPECTOMY WITH RADIOACTIVE SEED LOCALIZATION;  Surgeon: Darlene Mesa, MD;  Location: [facility];  Service: General;  Laterality: Right;   CARDIAC CATHETERIZATION N/A 05/26/2015   Procedure: Left Heart Cath and Coronary Angiography;  Surgeon: Darlene Booze, MD;  Location: [place] CV LAB;  Service: Cardiovascular;  Laterality: N/A;   CATARACT EXTRACTION Bilateral    CHOLECYSTECTOMY     COLON SURGERY     COLONOSCOPY N/A 12/27/2013   Procedure: COLONOSCOPY;  Surgeon: Darlene Houston, MD;  [facility];  Service: Endoscopy;  Laterality: N/A;  200   COLOSTOMY CLOSURE     ENDARTERECTOMY Left 03/10/2022   Procedure: LEFT CAROTID ENDARTERECTOMY;  Surgeon: Darlene Robins, MD;  Location[facility] OR;  Service: Vascular;  Laterality: Left;   ESOPHAGOGASTRODUODENOSCOPY N/A 05/07/2014   Procedure: ESOPHAGOGASTRODUODENOSCOPY (EGD);  Surgeon: Darlene Houston, MD;  [facility];  Service: Endoscopy;  Laterality: N/A;   EYE SURGERY Bilateral    Cat Sx   fracture left foot     HERNIA REPAIR     NM MYOCAR PERF WALL MOTION  01/28/2009   Normal   OTHER SURGICAL HISTORY     colostomy, colostomy reversal, for diverticulitis surgical hernia repair, arm surgery, neck surgery   PATCH ANGIOPLASTY Left 03/10/2022   Procedure: PATCH ANGIOPLASTY WITH 1X6CM [name];  Surgeon: Darlene Robins, MD;  Location: [facility] OR;  Service: Vascular;  Laterality: Left;   US ECHOCARDIOGRAPHY  02/11/2010   Mild MR,trace TR & AI     Current Outpatient Medications  Medication Sig Dispense Refill   albuterol (PROVENTIL HFA;VENTOLIN HFA) 108 (90 BASE) MCG/ACT inhaler Inhale 2  puffs into the lungs every 4 (four) hours as needed for shortness of breath. 1 Inhaler 0   amitriptyline (ELAVIL) 25 MG tablet Take 25 mg by mouth at bedtime.     anastrozole (ARIMIDEX) 1 MG tablet TAKE 1 TABLET BY MOUTH EVERY DAY (Patient taking differently: Take 1 mg by mouth at bedtime.) 90 tablet 3   carvedilol (COREG) 6.25 MG tablet Take 1 tablet (6.25 mg total) by mouth 2 (two) times daily. 180 tablet 3   cetirizine (ZYRTEC) 10 MG tablet Take 10 mg by mouth daily.     Cholecalciferol (VITAMIN D-3) 1000 units CAPS Take 1,000 Units by mouth daily.     diazepam (VALIUM) 2 MG tablet  Take 2 mg by mouth 2 (two) times daily as needed for anxiety (dizziness).     dorzolamide-timolol (COSOPT) 22.3-6.8 MG/ML ophthalmic solution INSTILL 1 DROP INTO RIGHT EYE TWICE A DAY 30 mL 2   doxycycline (VIBRAMYCIN) 100 MG capsule Take 1 capsule (100 mg total) by mouth 2 (two) times daily. 14 capsule 0   esomeprazole (NEXIUM) 20 MG capsule Take 20 mg by mouth daily at 12 noon.     FARXIGA 5 MG TABS tablet Take 5 mg by mouth every morning.     fluticasone (FLOVENT HFA) 110 MCG/ACT inhaler Inhale 1 puff into the lungs 2 (two) times daily. Rinse mouth with water after each use 1 each 0   furosemide (LASIX) 40 MG tablet TAKE 1 TABLET (40 MG TOTAL) BY MOUTH DAILY. MAY TAKE EXTRA DAILY AS NEEDED FOR SWELLING 180 tablet 0   gabapentin (NEURONTIN) 300 MG capsule Take 300 mg by mouth 3 (three) times daily.     glimepiride (AMARYL) 2 MG tablet Take 2 mg by mouth daily.  2   hydrALAZINE (APRESOLINE) 25 MG tablet TAKE ONE TABLET BY MOUTH AT BREAKFAST AND AT BEDTIME 180 tablet 1   isosorbide mononitrate (IMDUR) 30 MG 24 hr tablet TAKE ONE TABLET BY MOUTH ONCE DAILY 90 tablet 3   latanoprost (XALATAN) 0.005 % ophthalmic solution Place 1 drop into both eyes at bedtime.     meclizine (ANTIVERT) 25 MG tablet Take 25 mg by mouth 2 (two) times daily as needed for dizziness.     Multiple Vitamins-Minerals (PRESERVISION AREDS 2 PO)  Take 1 capsule by mouth in the morning and at bedtime.     Omega-3 Fatty Acids (FISH OIL) 1200 MG CAPS Take 1,200 mg by mouth 2 (two) times daily.     Polyethyl Glycol-Propyl Glycol (SYSTANE OP) Place 1 drop into the left eye daily as needed (dry eye).     polyethylene glycol (MIRALAX / GLYCOLAX) packet Take 17 g by mouth daily.     potassium chloride SA (KLOR-CON M) 20 MEQ tablet TAKE ONE-HALF TABLET BY  MOUTH DAILY 45 tablet 2   PRALUENT 75 MG/ML SOAJ INJECT 75 MG into THE SKIN EVERY 14 DAYS 2 mL 11   prednisoLONE acetate (PRED FORTE) 1 % ophthalmic suspension Place 1 drop into the right eye 2 (two) times daily. Taper     promethazine-dextromethorphan (PROMETHAZINE-DM) 6.25-15 MG/5ML syrup Take 5 mLs by mouth 4 (four) times daily as needed. 100 mL 0   TRADJENTA 5 MG TABS tablet Take 5 mg by mouth daily.     vitamin B-12 (CYANOCOBALAMIN) 100 MCG tablet Take 100 mcg by mouth daily.     warfarin (COUMADIN) 2 MG tablet TAKE ONE TABLET BY MOUTH EVERYDAY AT BEDTIME AS DIRECTED by THE coumadin Clinic 40 tablet 3   No current facility-administered medications for this visit.    Allergies:   Alphagan [brimonidine], Diflunisal, Vioxx [rofecoxib], Metformin and related, Nexlizet [bempedoic acid-ezetimibe], Repatha [evolocumab], Codeine, Elemental sulfur, Motrin [ibuprofen], Penicillins, and Pravastatin    Social History:  The patient  reports that she quit smoking about 8 years ago. Her smoking use included cigarettes. She has never been exposed to tobacco smoke. She has never used smokeless tobacco. She reports that she does not drink alcohol and does not use drugs.   Family History:  The patient's family history includes Bone cancer in her maternal uncle; Breast cancer in her maternal grandmother and sister; CVA (age of onset: 76) in her maternal grandmother; Cancer in her  mother; Glaucoma in her maternal uncle; Heart attack (age of onset: 70) in her brother; Heart disease in her maternal grandmother;  Leukemia in her maternal aunt; Other (age of onset: 68) in her mother; Spina bifida in her daughter; Stroke in her maternal grandmother.    ROS:  Please see the history of present illness.    (+) Wheezing (+) Cough (+) LE edema All other systems are reviewed and negative.    PHYSICAL EXAM: VS:  BP 122/82 (BP Location: Left Arm, Patient Position: Sitting, Cuff Size: Normal)   Pulse 80   Ht '5\' 2"'$  (1.575 m)   Wt 160 lb (72.6 kg)   SpO2 93%   BMI 29.26 kg/m  , BMI Body mass index is 29.26 kg/m. GENERAL:  Well appearing HEENT: Pupils equal round and reactive, fundi not visualized, oral mucosa unremarkable NECK:  No jugular venous distention, waveform within normal limits, carotid upstroke brisk and symmetric, R carotid bruit.  L CEA well-healed LUNGS:  Clear to auscultation bilaterally HEART:  RRR.  PMI not displaced or sustained,S1 and S2 within normal limits, no S3, no S4, no clicks, no rubs, no murmurs ABD:  Flat, positive bowel sounds normal in frequency in pitch, no bruits, no rebound, no guarding, no midline pulsatile mass, no hepatomegaly, no splenomegaly EXT:  2 plus pulses throughout, 1+ tense LE edema, no cyanosis no clubbing SKIN:  No rashes no nodules NEURO:  Cranial nerves II through XII grossly intact, motor grossly intact throughout PSYCH:  Cognitively intact, oriented to person place and time  EKG: EKG is personally reviewed.  06/07/22:  03/10/22: Sinus rhythm. Rate 68 bpm. RBBB. LPFB 03/10/21: Sinus rhythm.  Rate 74 bpm.  RBBB.  LPFB.  04/11/20: Sinus rhythm.  Rate 75 bpm.  RBBB.  09/28/19: Sinus rhythm.  Rate 75 bpm.  RBBB. 09/15/17: Sinus rhythm.  Rate 64 bpm. RBBB  03/02/17: Sinus rhythm.  RBBB.  LPFB 02/16/16: Sinus rhythm rate 83 bpm.  PAC.  RBBB.  LPFB.  CT Angio Neck 03/01/22: IMPRESSION: 1. Bulky calcified plaque in the distal left common carotid artery extending to the bifurcation resulting in high-grade stenosis of the distal common carotid artery/origin of the  ICA, estimated at greater than 75%. 2. Calcified plaque at the right carotid bifurcation without greater than 50% stenosis. 3. Patent vertebral arteries in the neck.  Bilateral Carotid Arteria Doppler 12/28/21: Summary:  Right Carotid: Velocities in the right ICA are consistent with a 1-39%  stenosis.                Hemodynamically significant plaque >50% visualized in the  CCA.  Left Carotid: Velocities in the left ICA are consistent with a 80-99%  stenosis.               Hemodynamically significant plaque >50% visualized in the  CCA. The                ECA appears <50% stenosed.  Vertebrals:  Bilateral vertebral arteries demonstrate antegrade flow.  Subclavians: Normal flow hemodynamics were seen in bilateral subclavian               arteries.   Chest X-ray 09/14/21: FINDINGS: Cardiomediastinal silhouette unchanged in size and contour. No evidence of central vascular congestion. No interlobular septal thickening.   No pneumothorax or pleural effusion. Coarsened interstitial markings, with no confluent airspace disease.   No acute displaced fracture. Degenerative changes of the spine.   Surgical changes of the cervical region   IMPRESSION: Negative  for acute cardiopulmonary disease  Lexiscan Myoview 10/2019: Nuclear stress EF: 74%. The left ventricular ejection fraction is hyperdynamic (>65%). There was no ST segment deviation noted during stress. This is a low risk study. There is no evidence of ischemia or previous infarction. The study is normal.  Exercise Myoview 04/30/15 The left ventricular ejection fraction is hyperdynamic (>65%). Nuclear stress EF: 78%. Blood pressure demonstrated a hypertensive response to exercise. Upsloping ST segment depression ST segment depression was noted during stress in the III, II and aVF leads. This is not diagnostic for ischemia. Defect 1: There is a small defect of moderate severity present in the mid anterior and apical anterior  location. Wall motion is normal, so this is likely artifact due to breast attenuation. However, cannot rule out LAD ischemia. This is a low risk study.  Cardiac cath 05/26/15: Mid LAD lesion, 50% stenosed. FFR of this lesion showed value of 0.85. This is felt to be nonsignificant. Normal LVEDP. Continue aggressive medical therapy. Would consider adding long-acting nitrate as the patient may have some component of vasospasm. Of note, in the future , if repeat catheterization was needed , would consider using a 5 [place] guide catheter as her left main is very short and the catheter tends to deep seat  Into either the LAD or the circumflex.  Recent Labs: 03/10/2022: ALT 34 03/11/2022: BUN 19; Creatinine, Ser 0.88; Potassium 3.8; Sodium 142 03/12/2022: Hemoglobin 13.6; Platelets 161   03/12/15: INR 2.9   Lipid Panel    Component Value Date/Time   CHOL 139 03/11/2022 0650   CHOL 132 08/20/2021 1120   TRIG 161 (H) 03/11/2022 0650   HDL 44 03/11/2022 0650   HDL 49 08/20/2021 1120   CHOLHDL 3.2 03/11/2022 0650   VLDL 32 03/11/2022 0650   LDLCALC 63 03/11/2022 0650   LDLCALC 60 08/20/2021 1120    Wt Readings from Last 3 Encounters:  06/07/22 160 lb (72.6 kg)  04/12/22 163 lb 8 oz (74.2 kg)  03/30/22 160 lb 12.8 oz (72.9 kg)     ASSESSMENT AND PLAN:  CAD in native artery Non-obstructive CAD.  She has no angina.  Lipids are well-controlled. Continue warfarin, carvedilol, Imdur and Praluent.    Carotid stenosis She is s/p L CEA 03/2022.  She is recovering well.  Continue Praluent.  She isn't on aspirin given that she is on warfarin.   Chronic diastolic heart failure ([place]) She is euvolemic and doing well.  BP is well-controlled.  Continue carvedilol, Farxiga, lasix, and Imdur.  DVT (deep venous thrombosis) She is on chronic warfarin.    Current medicines are reviewed at length with the patient today.  The patient does not have concerns regarding medicines.  The following changes have  been made:  no change  Labs/ tests ordered today include:   No orders of the defined types were placed in this encounter.    Disposition:   FU with Dailey Buccheri C. Oval Linsey, MD in 6 months.Ericka Pontiff as a scribe for Darlene Latch, MD.,have documented all relevant documentation on the behalf of Darlene Latch, MD,as directed by  Darlene Latch, MD while in the presence of Darlene Latch, MD.

## 2022-06-07 NOTE — Assessment & Plan Note (Addendum)
Non-obstructive CAD.  She has no angina.  Lipids are well-controlled. Continue warfarin, carvedilol, Imdur and Praluent.

## 2022-06-07 NOTE — Assessment & Plan Note (Signed)
She is on chronic warfarin.

## 2022-06-07 NOTE — Patient Instructions (Signed)
Medication Instructions:  Your physician recommends that you continue on your current medications as directed. Please refer to the Current Medication list given to you today.   *If you need a refill on your cardiac medications before your next appointment, please call your pharmacy*  Lab Work: NONE  Testing/Procedures: NONE  Follow-Up: At [facility], you and your health needs are our priority.  As part of our continuing mission to provide you with exceptional heart care, we have created designated Provider Care Teams.  These Care Teams include your primary Cardiologist (physician) and Advanced Practice Providers (APPs -  Physician Assistants and Nurse Practitioners) who all work together to provide you with the care you need, when you need it.  We recommend signing up for the patient portal called "MyChart".  Sign up information is provided on this After Visit Summary.  MyChart is used to connect with patients for Virtual Visits (Telemedicine).  Patients are able to view lab/test results, encounter notes, upcoming appointments, etc.  Non-urgent messages can be sent to your provider as well.   To learn more about what you can do with MyChart, go to [email or web address].    Your next appointment:   6 month(s)  The format for your next appointment:   In Person  Provider:   Skeet Latch, MD

## 2022-06-07 NOTE — Assessment & Plan Note (Signed)
She is s/p L CEA 03/2022.  She is recovering well.  Continue Praluent.  She isn't on aspirin given that she is on warfarin.

## 2022-06-22 ENCOUNTER — Ambulatory Visit: Payer: HMO | Attending: Cardiology | Admitting: *Deleted

## 2022-06-22 DIAGNOSIS — Z5181 Encounter for therapeutic drug level monitoring: Secondary | ICD-10-CM | POA: Diagnosis not present

## 2022-06-22 DIAGNOSIS — I82A12 Acute embolism and thrombosis of left axillary vein: Secondary | ICD-10-CM | POA: Diagnosis not present

## 2022-06-22 LAB — POCT INR: INR: 1.6 — AB (ref 2.0–3.0)

## 2022-06-22 NOTE — Patient Instructions (Signed)
S/P Lt CEA on 03/10/22   Take warfarin 2 tablets tonight, take 1 1/2 tablets tomorrow night then resume 1 tablet daily   Recheck INR in 3 wk.  Call Coumadin clinic for any questions or changes in medications.

## 2022-07-03 ENCOUNTER — Other Ambulatory Visit: Payer: Self-pay | Admitting: Family Medicine

## 2022-07-04 NOTE — Telephone Encounter (Signed)
Pt no longer under prescribers care. Prescribed med at [facility] By Curly [place] PA-C 06/06/22  Requested Prescriptions  Refused Prescriptions Disp Refills   FLOVENT HFA 110 MCG/ACT inhaler [Pharmacy Med Name: [place] HFA 110 MCG INHALER] 12 each     Sig: INHALE 1 PUFF INTO THE LUNGS 2 (TWO) TIMES DAILY. RINSE MOUTH WITH WATER AFTER EACH USE     There is no refill protocol information for this order

## 2022-07-08 NOTE — Progress Notes (Shared)
[facility] Note  07/14/2022     CHIEF COMPLAINT Patient presents for No chief complaint on file.  HISTORY OF PRESENT ILLNESS: [name] is a 82 y.o. female who presents to the clinic today for:    Referring physician: Sharilyn Sites, MD [address] [place],  [place] [address]  HISTORICAL INFORMATION:  Selected notes from the MEDICAL RECORD NUMBER Referred by Dr. Madelin Headings for concern of SRF OD LEE: 11.27.20 (M. Cotter) [BCVA: OD: 20/80-- OS: 20/60-]  Ocular Hx-glaucoma (latanoprost)  PMH-DM    CURRENT MEDICATIONS: Current Outpatient Medications (Ophthalmic Drugs)  Medication Sig   dorzolamide-timolol (COSOPT) 22.3-6.8 MG/ML ophthalmic solution INSTILL 1 DROP INTO RIGHT EYE TWICE A DAY   latanoprost (XALATAN) 0.005 % ophthalmic solution Place 1 drop into both eyes at bedtime.   Polyethyl Glycol-Propyl Glycol (SYSTANE OP) Place 1 drop into the left eye daily as needed (dry eye).   prednisoLONE acetate (PRED FORTE) 1 % ophthalmic suspension Place 1 drop into the right eye 2 (two) times daily. Taper   No current facility-administered medications for this visit. (Ophthalmic Drugs)   Current Outpatient Medications (Other)  Medication Sig   albuterol (PROVENTIL HFA;VENTOLIN HFA) 108 (90 BASE) MCG/ACT inhaler Inhale 2 puffs into the lungs every 4 (four) hours as needed for shortness of breath.   amitriptyline (ELAVIL) 25 MG tablet Take 25 mg by mouth at bedtime.   anastrozole (ARIMIDEX) 1 MG tablet TAKE 1 TABLET BY MOUTH EVERY DAY (Patient taking differently: Take 1 mg by mouth at bedtime.)   carvedilol (COREG) 6.25 MG tablet Take 1 tablet (6.25 mg total) by mouth 2 (two) times daily.   cetirizine (ZYRTEC) 10 MG tablet Take 10 mg by mouth daily.   Cholecalciferol (VITAMIN D-3) 1000 units CAPS Take 1,000 Units by mouth daily.   diazepam (VALIUM) 2 MG tablet Take 2 mg by mouth 2 (two) times daily as needed for anxiety (dizziness).    doxycycline (VIBRAMYCIN) 100 MG capsule Take 1 capsule (100 mg total) by mouth 2 (two) times daily.   esomeprazole (NEXIUM) 20 MG capsule Take 20 mg by mouth daily at 12 noon.   FARXIGA 5 MG TABS tablet Take 5 mg by mouth every morning.   fluticasone (FLOVENT HFA) 110 MCG/ACT inhaler Inhale 1 puff into the lungs 2 (two) times daily. Rinse mouth with water after each use   furosemide (LASIX) 40 MG tablet TAKE 1 TABLET (40 MG TOTAL) BY MOUTH DAILY. MAY TAKE EXTRA DAILY AS NEEDED FOR SWELLING   gabapentin (NEURONTIN) 300 MG capsule Take 300 mg by mouth 3 (three) times daily.   glimepiride (AMARYL) 2 MG tablet Take 2 mg by mouth daily.   hydrALAZINE (APRESOLINE) 25 MG tablet TAKE ONE TABLET BY MOUTH AT BREAKFAST AND AT BEDTIME   isosorbide mononitrate (IMDUR) 30 MG 24 hr tablet TAKE ONE TABLET BY MOUTH ONCE DAILY   meclizine (ANTIVERT) 25 MG tablet Take 25 mg by mouth 2 (two) times daily as needed for dizziness.   Multiple Vitamins-Minerals (PRESERVISION AREDS 2 PO) Take 1 capsule by mouth in the morning and at bedtime.   Omega-3 Fatty Acids (FISH OIL) 1200 MG CAPS Take 1,200 mg by mouth 2 (two) times daily.   polyethylene glycol (MIRALAX / GLYCOLAX) packet Take 17 g by mouth daily.   potassium chloride SA (KLOR-CON M) 20 MEQ tablet TAKE ONE-HALF TABLET BY  MOUTH DAILY   PRALUENT 75 MG/ML SOAJ INJECT 75 MG into THE SKIN EVERY 14  DAYS   promethazine-dextromethorphan (PROMETHAZINE-DM) 6.25-15 MG/5ML syrup Take 5 mLs by mouth 4 (four) times daily as needed.   TRADJENTA 5 MG TABS tablet Take 5 mg by mouth daily.   vitamin B-12 (CYANOCOBALAMIN) 100 MCG tablet Take 100 mcg by mouth daily.   warfarin (COUMADIN) 2 MG tablet TAKE ONE TABLET BY MOUTH EVERYDAY AT BEDTIME AS DIRECTED by THE coumadin Clinic   No current facility-administered medications for this visit. (Other)   REVIEW OF SYSTEMS:    ALLERGIES Allergies  Allergen Reactions   Alphagan [Brimonidine] Itching   Diflunisal Swelling    Other  reaction(s): ENTIRE BODY SWELLING   Vioxx [Rofecoxib] Shortness Of Breath   Metformin And Related     Kidney failure   Nexlizet [Bempedoic Acid-Ezetimibe]     Causes elevated Liver and Kidney function   Repatha [Evolocumab]     MYALGIAS   Codeine Rash   Elemental Sulfur Rash   Motrin [Ibuprofen] Rash   Penicillins Rash   Pravastatin Rash   PAST MEDICAL HISTORY Past Medical History:  Diagnosis Date   Antral gastritis    EGD 11/15   Asthmatic bronchitis    Back pain    Breast cancer ([place])    right breast   CAD in native artery 03/10/2021   Chronic diastolic heart failure ([place]) 05/20/2015   Grade 2 diastolic dysfunction.  04/2015.   Chronic kidney disease    kidney function low   Diabetes mellitus    x 5 yrs   DVT of axillary vein, acute left ([place]) 07/24/2012   GERD (gastroesophageal reflux disease)    Glaucoma    POAG OU   Heart murmur    History of hiatal hernia    History of kidney stones    Hyperlipidemia 05/20/2015   Hypertension    Hypertensive retinopathy    OU   Hypothyroidism    Kidney stones    Macular degeneration    Wet OD, Dry OS   Mixed hyperlipidemia    OSA (obstructive sleep apnea) 12/17/2021   Peripheral venous insufficiency    Pinched nerve    right elbow   Pneumonia    PONV (postoperative nausea and vomiting)    Sigmoid diverticulitis    Snoring 03/10/2021   Vertigo    chonic   Past Surgical History:  Procedure Laterality Date   ABDOMINAL HYSTERECTOMY     BACK SURGERY     spinal    BREAST LUMPECTOMY WITH RADIOACTIVE SEED LOCALIZATION Right 12/03/2020   Procedure: RIGHT BREAST LUMPECTOMY WITH RADIOACTIVE SEED LOCALIZATION;  Surgeon: Donnie Mesa, MD;  Location: [facility];  Service: General;  Laterality: Right;   CARDIAC CATHETERIZATION N/A 05/26/2015   Procedure: Left Heart Cath and Coronary Angiography;  Surgeon: Jettie Booze, MD;  Location: [facility];  Service: Cardiovascular;  Laterality: N/A;   CATARACT EXTRACTION  Bilateral    CHOLECYSTECTOMY     COLON SURGERY     COLONOSCOPY N/A 12/27/2013   Procedure: COLONOSCOPY;  Surgeon: Rogene Houston, MD;  Location: [facility];  Service: Endoscopy;  Laterality: N/A;  200   COLOSTOMY CLOSURE     ENDARTERECTOMY Left 03/10/2022   Procedure: LEFT CAROTID ENDARTERECTOMY;  Surgeon: Cherre Robins, MD;  Location: [facility];  Service: Vascular;  Laterality: Left;   ESOPHAGOGASTRODUODENOSCOPY N/A 05/07/2014   Procedure: ESOPHAGOGASTRODUODENOSCOPY (EGD);  Surgeon: Rogene Houston, MD;  [facility];  Service: Endoscopy;  Laterality: N/A;   EYE SURGERY Bilateral    Cat Sx  fracture left foot     HERNIA REPAIR     NM MYOCAR PERF WALL MOTION  01/28/2009   Normal   OTHER SURGICAL HISTORY     colostomy, colostomy reversal, for diverticulitis surgical hernia repair, arm surgery, neck surgery   PATCH ANGIOPLASTY Left 03/10/2022   Procedure: PATCH ANGIOPLASTY WITH 1X6CM [place];  Surgeon: Cherre Robins, MD;  Location: [facility];  Service: Vascular;  Laterality: Left;   US ECHOCARDIOGRAPHY  02/11/2010   Mild MR,trace TR & AI   FAMILY HISTORY Family History  Problem Relation Age of Onset   CVA Maternal Grandmother 90       deceased   Heart disease Maternal Grandmother    Stroke Maternal Grandmother    Breast cancer Maternal Grandmother    Other Mother 71       Cause unknown   Cancer Mother        liver   Heart attack Brother 29       deceased   Breast cancer Sister    Leukemia Maternal Aunt    Glaucoma Maternal Uncle    Bone cancer Maternal Uncle    Spina bifida Daughter    SOCIAL HISTORY Social History   Tobacco Use   Smoking status: Former    Types: Cigarettes    Quit date: 12/10/2013    Years since quitting: 8.5    Passive exposure: Never   Smokeless tobacco: Never   Tobacco comments:    Smoke 1-1 1/2 packs a day  Vaping Use   Vaping Use: Never used  Substance Use Topics   Alcohol use: No   Drug use: No       OPHTHALMIC  EXAM: Not recorded    IMAGING AND PROCEDURES  Imaging and Procedures for '@TODAY'$ @          ASSESSMENT/PLAN: No diagnosis found.  1. 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 Age related macular degeneration, non-exudative, OS  - intermediate stage   - The incidence, anatomy, and pathology of dry AMD, risk of progression, and the AREDS and AREDS 2 study including smoking risks discussed with patient.   - recommend [place] grid monitoring  3. Diabetes mellitus, type 2 without retinopathy  - The incidence, risk factors for progression, natural history and treatment options for diabetic retinopathy  were discussed with patient.    -  The need for close monitoring of blood glucose, blood pressure, and serum lipids, avoiding cigarette or any type of tobacco, and the need for long term follow up was also discussed with patient.  - monitor   4,5. Hypertensive retinopathy  OU  - discussed importance of tight BP control  - monitor   6,7. Pseudophakia OU, PCO OD  - s/p CE/IOL OU (Dr. Venetia Maxon)  - IOL in good position  - s/p yag cap OD (04.06.22) -- good PC opening  - monitor   8. POAG OU  - formerly managed by Dr. Venetia Maxon -- now following at [facility]  - s/p laser w/ Dr. Venetia Maxon -- ?SLT  - s/p trab OD w/ Dr. Shirleen Schirmer  - IOP 15,21 today  - currently on latanoprost QHS  Ophthalmic Meds Ordered this visit:  No orders of the defined types were placed in this encounter.    No follow-ups on file.  There are no Patient Instructions on file for this visit.  This document serves as a record of services personally performed by Gardiner Sleeper, MD, PhD. It was created on their behalf by Orvan Falconer, an ophthalmic technician. The creation of this record is the provider's dictation and/or activities during the visit.    Electronically signed by: Orvan Falconer, OA, 07/08/22  3:34 PM  Gardiner Sleeper, M.D., Ph.D. Diseases & Surgery of the Retina and Vitreous [facility]: M myopia (nearsighted); A astigmatism; H hyperopia (farsighted); P presbyopia; Mrx spectacle prescription;  CTL contact lenses; OD right eye; OS left eye; OU both eyes  XT exotropia; ET esotropia; PEK punctate epithelial keratitis; PEE punctate epithelial erosions; DES dry eye syndrome; MGD meibomian gland dysfunction; ATs artificial tears; PFAT's preservative free artificial tears; [place] nuclear sclerotic cataract; PSC posterior subcapsular cataract; ERM epi-retinal membrane; PVD posterior vitreous detachment; RD retinal detachment; DM diabetes mellitus; DR diabetic retinopathy; NPDR non-proliferative diabetic retinopathy; PDR proliferative diabetic retinopathy; CSME clinically significant macular edema; DME diabetic macular edema; dbh dot blot hemorrhages; CWS cotton wool spot; POAG primary open angle glaucoma; C/D cup-to-disc ratio; HVF  humphrey visual field; GVF goldmann visual field; OCT optical coherence tomography; IOP intraocular pressure; BRVO Branch retinal vein occlusion; CRVO central retinal vein occlusion; CRAO central retinal artery occlusion; BRAO branch retinal artery occlusion; RT retinal tear; SB scleral buckle; PPV pars plana vitrectomy; VH Vitreous hemorrhage; PRP panretinal laser photocoagulation; IVK intravitreal kenalog; VMT vitreomacular traction; MH Macular hole;  NVD neovascularization of the disc; NVE neovascularization elsewhere; AREDS age related eye disease study; ARMD age related macular degeneration; POAG primary open angle glaucoma; EBMD epithelial/anterior basement membrane dystrophy; ACIOL anterior chamber intraocular lens; IOL intraocular lens; PCIOL posterior chamber intraocular lens; Phaco/IOL phacoemulsification with intraocular lens placement; [place] photorefractive keratectomy; LASIK laser assisted in situ keratomileusis; HTN hypertension; DM diabetes mellitus; COPD chronic obstructive pulmonary disease

## 2022-07-14 ENCOUNTER — Encounter (INDEPENDENT_AMBULATORY_CARE_PROVIDER_SITE_OTHER): Payer: HMO | Admitting: Ophthalmology

## 2022-07-14 ENCOUNTER — Ambulatory Visit: Payer: HMO | Attending: Cardiology

## 2022-07-14 DIAGNOSIS — H353211 Exudative age-related macular degeneration, right eye, with active choroidal neovascularization: Secondary | ICD-10-CM

## 2022-07-14 DIAGNOSIS — E119 Type 2 diabetes mellitus without complications: Secondary | ICD-10-CM

## 2022-07-14 DIAGNOSIS — H26491 Other secondary cataract, right eye: Secondary | ICD-10-CM

## 2022-07-14 DIAGNOSIS — Z961 Presence of intraocular lens: Secondary | ICD-10-CM

## 2022-07-14 DIAGNOSIS — H35033 Hypertensive retinopathy, bilateral: Secondary | ICD-10-CM

## 2022-07-14 DIAGNOSIS — I10 Essential (primary) hypertension: Secondary | ICD-10-CM

## 2022-07-14 DIAGNOSIS — H353122 Nonexudative age-related macular degeneration, left eye, intermediate dry stage: Secondary | ICD-10-CM

## 2022-07-15 NOTE — Progress Notes (Shared)
[facility] Note  07/21/2022     CHIEF COMPLAINT Patient presents for No chief complaint on file.  HISTORY OF PRESENT ILLNESS: [name] is a 82 y.o. female who presents to the clinic today for:    Referring physician: Sharilyn Sites, MD [address] [place],  [place] [address]  HISTORICAL INFORMATION:  Selected notes from the MEDICAL RECORD NUMBER Referred by Dr. Madelin Headings for concern of SRF OD LEE: 11.27.20 (M. Cotter) [BCVA: OD: 20/80-- OS: 20/60-]  Ocular Hx-glaucoma (latanoprost)  PMH-DM    CURRENT MEDICATIONS: Current Outpatient Medications (Ophthalmic Drugs)  Medication Sig   dorzolamide-timolol (COSOPT) 22.3-6.8 MG/ML ophthalmic solution INSTILL 1 DROP INTO RIGHT EYE TWICE A DAY   latanoprost (XALATAN) 0.005 % ophthalmic solution Place 1 drop into both eyes at bedtime.   Polyethyl Glycol-Propyl Glycol (SYSTANE OP) Place 1 drop into the left eye daily as needed (dry eye).   prednisoLONE acetate (PRED FORTE) 1 % ophthalmic suspension Place 1 drop into the right eye 2 (two) times daily. Taper   No current facility-administered medications for this visit. (Ophthalmic Drugs)   Current Outpatient Medications (Other)  Medication Sig   albuterol (PROVENTIL HFA;VENTOLIN HFA) 108 (90 BASE) MCG/ACT inhaler Inhale 2 puffs into the lungs every 4 (four) hours as needed for shortness of breath.   amitriptyline (ELAVIL) 25 MG tablet Take 25 mg by mouth at bedtime.   anastrozole (ARIMIDEX) 1 MG tablet TAKE 1 TABLET BY MOUTH EVERY DAY (Patient taking differently: Take 1 mg by mouth at bedtime.)   carvedilol (COREG) 6.25 MG tablet Take 1 tablet (6.25 mg total) by mouth 2 (two) times daily.   cetirizine (ZYRTEC) 10 MG tablet Take 10 mg by mouth daily.   Cholecalciferol (VITAMIN D-3) 1000 units CAPS Take 1,000 Units by mouth daily.   diazepam (VALIUM) 2 MG tablet Take 2 mg by mouth 2 (two) times daily as needed for anxiety (dizziness).    doxycycline (VIBRAMYCIN) 100 MG capsule Take 1 capsule (100 mg total) by mouth 2 (two) times daily.   esomeprazole (NEXIUM) 20 MG capsule Take 20 mg by mouth daily at 12 noon.   FARXIGA 5 MG TABS tablet Take 5 mg by mouth every morning.   fluticasone (FLOVENT HFA) 110 MCG/ACT inhaler Inhale 1 puff into the lungs 2 (two) times daily. Rinse mouth with water after each use   furosemide (LASIX) 40 MG tablet TAKE 1 TABLET (40 MG TOTAL) BY MOUTH DAILY. MAY TAKE EXTRA DAILY AS NEEDED FOR SWELLING   gabapentin (NEURONTIN) 300 MG capsule Take 300 mg by mouth 3 (three) times daily.   glimepiride (AMARYL) 2 MG tablet Take 2 mg by mouth daily.   hydrALAZINE (APRESOLINE) 25 MG tablet TAKE ONE TABLET BY MOUTH AT BREAKFAST AND AT BEDTIME   isosorbide mononitrate (IMDUR) 30 MG 24 hr tablet TAKE ONE TABLET BY MOUTH ONCE DAILY   meclizine (ANTIVERT) 25 MG tablet Take 25 mg by mouth 2 (two) times daily as needed for dizziness.   Multiple Vitamins-Minerals (PRESERVISION AREDS 2 PO) Take 1 capsule by mouth in the morning and at bedtime.   Omega-3 Fatty Acids (FISH OIL) 1200 MG CAPS Take 1,200 mg by mouth 2 (two) times daily.   polyethylene glycol (MIRALAX / GLYCOLAX) packet Take 17 g by mouth daily.   potassium chloride SA (KLOR-CON M) 20 MEQ tablet TAKE ONE-HALF TABLET BY  MOUTH DAILY   PRALUENT 75 MG/ML SOAJ INJECT 75 MG into THE SKIN EVERY 14  DAYS   promethazine-dextromethorphan (PROMETHAZINE-DM) 6.25-15 MG/5ML syrup Take 5 mLs by mouth 4 (four) times daily as needed.   TRADJENTA 5 MG TABS tablet Take 5 mg by mouth daily.   vitamin B-12 (CYANOCOBALAMIN) 100 MCG tablet Take 100 mcg by mouth daily.   warfarin (COUMADIN) 2 MG tablet TAKE ONE TABLET BY MOUTH EVERYDAY AT BEDTIME AS DIRECTED by THE coumadin Clinic   No current facility-administered medications for this visit. (Other)   REVIEW OF SYSTEMS:    ALLERGIES Allergies  Allergen Reactions   Alphagan [Brimonidine] Itching   Diflunisal Swelling    Other  reaction(s): ENTIRE BODY SWELLING   Vioxx [Rofecoxib] Shortness Of Breath   Metformin And Related     Kidney failure   Nexlizet [Bempedoic Acid-Ezetimibe]     Causes elevated Liver and Kidney function   Repatha [Evolocumab]     MYALGIAS   Codeine Rash   Elemental Sulfur Rash   Motrin [Ibuprofen] Rash   Penicillins Rash   Pravastatin Rash   PAST MEDICAL HISTORY Past Medical History:  Diagnosis Date   Antral gastritis    EGD 11/15   Asthmatic bronchitis    Back pain    Breast cancer ([place])    right breast   CAD in native artery 03/10/2021   Chronic diastolic heart failure ([place]) 05/20/2015   Grade 2 diastolic dysfunction.  04/2015.   Chronic kidney disease    kidney function low   Diabetes mellitus    x 5 yrs   DVT of axillary vein, acute left ([place]) 07/24/2012   GERD (gastroesophageal reflux disease)    Glaucoma    POAG OU   Heart murmur    History of hiatal hernia    History of kidney stones    Hyperlipidemia 05/20/2015   Hypertension    Hypertensive retinopathy    OU   Hypothyroidism    Kidney stones    Macular degeneration    Wet OD, Dry OS   Mixed hyperlipidemia    OSA (obstructive sleep apnea) 12/17/2021   Peripheral venous insufficiency    Pinched nerve    right elbow   Pneumonia    PONV (postoperative nausea and vomiting)    Sigmoid diverticulitis    Snoring 03/10/2021   Vertigo    chonic   Past Surgical History:  Procedure Laterality Date   ABDOMINAL HYSTERECTOMY     BACK SURGERY     spinal    BREAST LUMPECTOMY WITH RADIOACTIVE SEED LOCALIZATION Right 12/03/2020   Procedure: RIGHT BREAST LUMPECTOMY WITH RADIOACTIVE SEED LOCALIZATION;  Surgeon: Donnie Mesa, MD;  Location: [facility];  Service: General;  Laterality: Right;   CARDIAC CATHETERIZATION N/A 05/26/2015   Procedure: Left Heart Cath and Coronary Angiography;  Surgeon: Jettie Booze, MD;  Location: [facility];  Service: Cardiovascular;  Laterality: N/A;   CATARACT EXTRACTION  Bilateral    CHOLECYSTECTOMY     COLON SURGERY     COLONOSCOPY N/A 12/27/2013   Procedure: COLONOSCOPY;  Surgeon: Rogene Houston, MD;  [facility];  Service: Endoscopy;  Laterality: N/A;  200   COLOSTOMY CLOSURE     ENDARTERECTOMY Left 03/10/2022   Procedure: LEFT CAROTID ENDARTERECTOMY;  Surgeon: Cherre Robins, MD;  Location[facility];  Service: Vascular;  Laterality: Left;   ESOPHAGOGASTRODUODENOSCOPY N/A 05/07/2014   Procedure: ESOPHAGOGASTRODUODENOSCOPY (EGD);  Surgeon: Rogene Houston, MD;  [facility];  Service: Endoscopy;  Laterality: N/A;   EYE SURGERY Bilateral    Cat Sx  fracture left foot     HERNIA REPAIR     NM MYOCAR PERF WALL MOTION  01/28/2009   Normal   OTHER SURGICAL HISTORY     colostomy, colostomy reversal, for diverticulitis surgical hernia repair, arm surgery, neck surgery   PATCH ANGIOPLASTY Left 03/10/2022   Procedure: PATCH ANGIOPLASTY WITH 1X6CM Weyman Pedro;  Surgeon: Cherre Robins, MD;  Location: [facility] OR;  Service: Vascular;  Laterality: Left;   US ECHOCARDIOGRAPHY  02/11/2010   Mild MR,trace TR & AI   FAMILY HISTORY Family History  Problem Relation Age of Onset   CVA Maternal Grandmother 90       deceased   Heart disease Maternal Grandmother    Stroke Maternal Grandmother    Breast cancer Maternal Grandmother    Other Mother 73       Cause unknown   Cancer Mother        liver   Heart attack Brother 109       deceased   Breast cancer Sister    Leukemia Maternal Aunt    Glaucoma Maternal Uncle    Bone cancer Maternal Uncle    Spina bifida Daughter    SOCIAL HISTORY Social History   Tobacco Use   Smoking status: Former    Types: Cigarettes    Quit date: 12/10/2013    Years since quitting: 8.6    Passive exposure: Never   Smokeless tobacco: Never   Tobacco comments:    Smoke 1-1 1/2 packs a day  Vaping Use   Vaping Use: Never used  Substance Use Topics   Alcohol use: No   Drug use: No       OPHTHALMIC  EXAM: Not recorded    IMAGING AND PROCEDURES  Imaging and Procedures for '@TODAY'$ @          ASSESSMENT/PLAN: No diagnosis found.  1. 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 Age related macular degeneration, non-exudative, OS  - intermediate stage  - The incidence, anatomy, and pathology of dry AMD, risk of progression, and the AREDS and AREDS 2 study including smoking risks discussed with patient.   - recommend Amsler grid monitoring  3. Diabetes mellitus, type 2 without retinopathy  - The incidence, risk factors for progression, natural history and treatment options for diabetic retinopathy  were discussed with patient.    -  The need for close monitoring of blood glucose, blood pressure, and serum lipids, avoiding cigarette or any type of tobacco, and the need for long term follow up was also discussed with patient.  - monitor   4,5. Hypertensive retinopathy  OU  - discussed importance of tight BP control  - monitor   6,7. Pseudophakia OU, PCO OD  - s/p CE/IOL OU (Dr. Venetia Maxon)  - IOL in good position  - s/p yag cap OD (04.06.22) -- good PC opening  - monitor   8. POAG OU  - formerly managed by Dr. Venetia Maxon -- now following at [facility]  - s/p laser w/ Dr. Venetia Maxon -- ?SLT  - s/p trab OD w/ Dr. Shirleen Schirmer  - IOP 15,21 today  - currently on latanoprost QHS  Ophthalmic Meds Ordered this visit:  No orders of the defined types were placed in this encounter.    No follow-ups on file.  There are no Patient Instructions on file for this visit.  This document serves as a record of services personally performed by Gardiner Sleeper, MD, PhD. It was created on their behalf by Orvan Falconer, an ophthalmic technician. The creation of this record is the provider's dictation and/or activities during the visit.    Electronically signed by: Orvan Falconer, OA, 07/15/22  1:28 PM   Gardiner Sleeper, M.D., Ph.D. Diseases & Surgery of the Retina and Vitreous [facility]: M myopia (nearsighted); A astigmatism; H hyperopia (farsighted); P presbyopia; Mrx spectacle prescription;  CTL contact lenses; OD right eye; OS left eye; OU both eyes  XT exotropia; ET esotropia; PEK punctate epithelial keratitis; PEE punctate epithelial erosions; DES dry eye syndrome; MGD meibomian gland dysfunction; ATs artificial tears; PFAT's preservative free artificial tears; [place] nuclear sclerotic cataract; PSC posterior subcapsular cataract; ERM epi-retinal membrane; PVD posterior vitreous detachment; RD retinal detachment; DM diabetes mellitus; DR diabetic retinopathy; NPDR non-proliferative diabetic retinopathy; PDR proliferative diabetic retinopathy; CSME clinically significant macular edema; DME diabetic macular edema; dbh dot blot hemorrhages; CWS cotton wool spot; POAG primary open angle glaucoma; C/D cup-to-disc ratio; HVF  humphrey visual field; GVF goldmann visual field; OCT optical coherence tomography; IOP intraocular pressure; BRVO Branch retinal vein occlusion; CRVO central retinal vein occlusion; CRAO central retinal artery occlusion; BRAO branch retinal artery occlusion; RT retinal tear; SB scleral buckle; PPV pars plana vitrectomy; VH Vitreous hemorrhage; PRP panretinal laser photocoagulation; IVK intravitreal kenalog; VMT vitreomacular traction; MH Macular hole;  NVD neovascularization of the disc; NVE neovascularization elsewhere; AREDS age related eye disease study; ARMD age related macular degeneration; POAG primary open angle glaucoma; EBMD epithelial/anterior basement membrane dystrophy; ACIOL anterior chamber intraocular lens; IOL intraocular lens; PCIOL posterior chamber intraocular lens; Phaco/IOL phacoemulsification with intraocular lens placement; [place] photorefractive keratectomy; LASIK laser assisted in situ keratomileusis; HTN hypertension; DM diabetes mellitus; COPD chronic obstructive pulmonary disease

## 2022-07-21 ENCOUNTER — Encounter (INDEPENDENT_AMBULATORY_CARE_PROVIDER_SITE_OTHER): Payer: Medicare Other | Admitting: Ophthalmology

## 2022-07-21 DIAGNOSIS — H353211 Exudative age-related macular degeneration, right eye, with active choroidal neovascularization: Secondary | ICD-10-CM

## 2022-07-21 DIAGNOSIS — H353122 Nonexudative age-related macular degeneration, left eye, intermediate dry stage: Secondary | ICD-10-CM

## 2022-07-21 DIAGNOSIS — Z961 Presence of intraocular lens: Secondary | ICD-10-CM

## 2022-07-21 DIAGNOSIS — I10 Essential (primary) hypertension: Secondary | ICD-10-CM

## 2022-07-21 DIAGNOSIS — E119 Type 2 diabetes mellitus without complications: Secondary | ICD-10-CM

## 2022-07-21 DIAGNOSIS — H35033 Hypertensive retinopathy, bilateral: Secondary | ICD-10-CM

## 2022-08-09 ENCOUNTER — Ambulatory Visit: Payer: HMO | Attending: Cardiology | Admitting: *Deleted

## 2022-08-09 DIAGNOSIS — Z5181 Encounter for therapeutic drug level monitoring: Secondary | ICD-10-CM

## 2022-08-09 DIAGNOSIS — I82A12 Acute embolism and thrombosis of left axillary vein: Secondary | ICD-10-CM

## 2022-08-09 LAB — POCT INR: INR: 2.4 (ref 2.0–3.0)

## 2022-08-09 NOTE — Patient Instructions (Signed)
S/P Lt CEA on 03/10/22   Continue warfarin 1 tablet daily   Recheck INR in 4 wk.  Call Coumadin clinic for any questions or changes in medications.

## 2022-08-17 ENCOUNTER — Other Ambulatory Visit (HOSPITAL_COMMUNITY): Payer: Self-pay

## 2022-08-31 NOTE — Progress Notes (Signed)
[facility] Note  09/02/2022     CHIEF COMPLAINT Patient presents for Retina Follow Up  HISTORY OF PRESENT ILLNESS: [name] is a 82 y.o. female who presents to the clinic today for:  HPI     Retina Follow Up   Patient presents with  Wet AMD.  In both eyes.  This started 15 weeks ago.  I, the attending physician,  performed the HPI with the patient and updated documentation appropriately.        Comments   Patient here for 8 weeks (15 weeks) retina follow up for exu ARMD OU. Patient states vision doing pretty good. No eye pain. Using drops.       Last edited by Bernarda Caffey, MD on 09/02/2022 12:01 PM.     Patient is delayed to follow up due to having L carotid artery surgery.   Referring physician: Sharilyn Sites, MD [address] [place],  [place] [address]  HISTORICAL INFORMATION:  Selected notes from the MEDICAL RECORD NUMBER Referred by Dr. Madelin Headings for concern of SRF OD LEE: 11.27.20 (M. Cotter) [BCVA: OD: 20/80-- OS: 20/60-]  Ocular Hx-glaucoma (latanoprost)  PMH-DM    CURRENT MEDICATIONS: Current Outpatient Medications (Ophthalmic Drugs)  Medication Sig   dorzolamide-timolol (COSOPT) 22.3-6.8 MG/ML ophthalmic solution INSTILL 1 DROP INTO RIGHT EYE TWICE A DAY   latanoprost (XALATAN) 0.005 % ophthalmic solution Place 1 drop into both eyes at bedtime.   Polyethyl Glycol-Propyl Glycol (SYSTANE OP) Place 1 drop into the left eye daily as needed (dry eye).   prednisoLONE acetate (PRED FORTE) 1 % ophthalmic suspension Place 1 drop into the right eye 2 (two) times daily. Taper   No current facility-administered medications for this visit. (Ophthalmic Drugs)   Current Outpatient Medications (Other)  Medication Sig   albuterol (PROVENTIL HFA;VENTOLIN HFA) 108 (90 BASE) MCG/ACT inhaler Inhale 2 puffs into the lungs every 4 (four) hours as needed for shortness of breath.   amitriptyline (ELAVIL) 25 MG tablet Take 25 mg by mouth  at bedtime.   anastrozole (ARIMIDEX) 1 MG tablet TAKE 1 TABLET BY MOUTH EVERY DAY (Patient taking differently: Take 1 mg by mouth at bedtime.)   carvedilol (COREG) 6.25 MG tablet Take 1 tablet (6.25 mg total) by mouth 2 (two) times daily.   cetirizine (ZYRTEC) 10 MG tablet Take 10 mg by mouth daily.   Cholecalciferol (VITAMIN D-3) 1000 units CAPS Take 1,000 Units by mouth daily.   diazepam (VALIUM) 2 MG tablet Take 2 mg by mouth 2 (two) times daily as needed for anxiety (dizziness).   doxycycline (VIBRAMYCIN) 100 MG capsule Take 1 capsule (100 mg total) by mouth 2 (two) times daily.   esomeprazole (NEXIUM) 20 MG capsule Take 20 mg by mouth daily at 12 noon.   FARXIGA 5 MG TABS tablet Take 5 mg by mouth every morning.   fluticasone (FLOVENT HFA) 110 MCG/ACT inhaler Inhale 1 puff into the lungs 2 (two) times daily. Rinse mouth with water after each use   furosemide (LASIX) 40 MG tablet TAKE 1 TABLET (40 MG TOTAL) BY MOUTH DAILY. MAY TAKE EXTRA DAILY AS NEEDED FOR SWELLING   gabapentin (NEURONTIN) 300 MG capsule Take 300 mg by mouth 3 (three) times daily.   glimepiride (AMARYL) 2 MG tablet Take 2 mg by mouth daily.   hydrALAZINE (APRESOLINE) 25 MG tablet TAKE ONE TABLET BY MOUTH AT BREAKFAST AND AT BEDTIME   isosorbide mononitrate (IMDUR) 30 MG 24 hr tablet TAKE ONE  TABLET BY MOUTH ONCE DAILY   meclizine (ANTIVERT) 25 MG tablet Take 25 mg by mouth 2 (two) times daily as needed for dizziness.   Multiple Vitamins-Minerals (PRESERVISION AREDS 2 PO) Take 1 capsule by mouth in the morning and at bedtime.   Omega-3 Fatty Acids (FISH OIL) 1200 MG CAPS Take 1,200 mg by mouth 2 (two) times daily.   polyethylene glycol (MIRALAX / GLYCOLAX) packet Take 17 g by mouth daily.   potassium chloride SA (KLOR-CON M) 20 MEQ tablet TAKE ONE-HALF TABLET BY  MOUTH DAILY   PRALUENT 75 MG/ML SOAJ INJECT 75 MG into THE SKIN EVERY 14 DAYS   promethazine-dextromethorphan (PROMETHAZINE-DM) 6.25-15 MG/5ML syrup Take 5 mLs by  mouth 4 (four) times daily as needed.   TRADJENTA 5 MG TABS tablet Take 5 mg by mouth daily.   vitamin B-12 (CYANOCOBALAMIN) 100 MCG tablet Take 100 mcg by mouth daily.   warfarin (COUMADIN) 2 MG tablet TAKE ONE TABLET BY MOUTH EVERYDAY AT BEDTIME AS DIRECTED by THE coumadin Clinic   No current facility-administered medications for this visit. (Other)   REVIEW OF SYSTEMS: ROS   Positive for: Genitourinary, Endocrine, Cardiovascular, Eyes Negative for: Constitutional, Gastrointestinal, Neurological, Skin, Musculoskeletal, HENT, Respiratory, Psychiatric, Allergic/Imm, Heme/Lymph Last edited by Theodore Demark, COA on 09/02/2022  9:55 AM.     ALLERGIES Allergies  Allergen Reactions   Alphagan [Brimonidine] Itching   Diflunisal Swelling    Other reaction(s): ENTIRE BODY SWELLING   Vioxx [Rofecoxib] Shortness Of Breath   Metformin And Related     Kidney failure   Nexlizet [Bempedoic Acid-Ezetimibe]     Causes elevated Liver and Kidney function   Repatha [Evolocumab]     MYALGIAS   Codeine Rash   Elemental Sulfur Rash   Motrin [Ibuprofen] Rash   Penicillins Rash   Pravastatin Rash   PAST MEDICAL HISTORY Past Medical History:  Diagnosis Date   Antral gastritis    EGD 11/15   Asthmatic bronchitis    Back pain    Breast cancer ([place])    right breast   CAD in native artery 03/10/2021   Chronic diastolic heart failure ([place]) 05/20/2015   Grade 2 diastolic dysfunction.  04/2015.   Chronic kidney disease    kidney function low   Diabetes mellitus    x 5 yrs   DVT of axillary vein, acute left ([place]) 07/24/2012   GERD (gastroesophageal reflux disease)    Glaucoma    POAG OU   Heart murmur    History of hiatal hernia    History of kidney stones    Hyperlipidemia 05/20/2015   Hypertension    Hypertensive retinopathy    OU   Hypothyroidism    Kidney stones    Macular degeneration    Wet OD, Dry OS   Mixed hyperlipidemia    OSA (obstructive sleep apnea) 12/17/2021    Peripheral venous insufficiency    Pinched nerve    right elbow   Pneumonia    PONV (postoperative nausea and vomiting)    Sigmoid diverticulitis    Snoring 03/10/2021   Vertigo    chonic   Past Surgical History:  Procedure Laterality Date   ABDOMINAL HYSTERECTOMY     BACK SURGERY     spinal    BREAST LUMPECTOMY WITH RADIOACTIVE SEED LOCALIZATION Right 12/03/2020   Procedure: RIGHT BREAST LUMPECTOMY WITH RADIOACTIVE SEED LOCALIZATION;  Surgeon: Donnie Mesa, MD;  Location: [facility];  Service: General;  Laterality: Right;   CARDIAC CATHETERIZATION N/A 05/26/2015  Procedure: Left Heart Cath and Coronary Angiography;  Surgeon: Jettie Booze, MD;  Location: [facility];  Service: Cardiovascular;  Laterality: N/A;   CATARACT EXTRACTION Bilateral    CHOLECYSTECTOMY     COLON SURGERY     COLONOSCOPY N/A 12/27/2013   Procedure: COLONOSCOPY;  Surgeon: Rogene Houston, MD;  [facility]: [facility];  Service: Endoscopy;  Laterality: N/A;  200   COLOSTOMY CLOSURE     ENDARTERECTOMY Left 03/10/2022   Procedure: LEFT CAROTID ENDARTERECTOMY;  Surgeon: Cherre Robins, MD;  Location[facility];  Service: Vascular;  Laterality: Left;   ESOPHAGOGASTRODUODENOSCOPY N/A 05/07/2014   Procedure: ESOPHAGOGASTRODUODENOSCOPY (EGD);  Surgeon: Rogene Houston, MD;  [facility];  Service: Endoscopy;  Laterality: N/A;   EYE SURGERY Bilateral    Cat Sx   fracture left foot     HERNIA REPAIR     NM MYOCAR PERF WALL MOTION  01/28/2009   Normal   OTHER SURGICAL HISTORY     colostomy, colostomy reversal, for diverticulitis surgical hernia repair, arm surgery, neck surgery   PATCH ANGIOPLASTY Left 03/10/2022   Procedure: PATCH ANGIOPLASTY WITH 1X6CM We[place] Pedro;  Surgeon: Cherre Robins, MD;  Location: [facility];  Service: Vascular;  Laterality: Left;   US ECHOCARDIOGRAPHY  02/11/2010   Mild MR,trace TR & AI   FAMILY HISTORY Family History  Problem Relation Age of Onset   CVA Maternal  Grandmother 90       deceased   Heart disease Maternal Grandmother    Stroke Maternal Grandmother    Breast cancer Maternal Grandmother    Other Mother 65       Cause unknown   Cancer Mother        liver   Heart attack Brother 27       deceased   Breast cancer Sister    Leukemia Maternal Aunt    Glaucoma Maternal Uncle    Bone cancer Maternal Uncle    Spina bifida Daughter    SOCIAL HISTORY Social History   Tobacco Use   Smoking status: Former    Types: Cigarettes    Quit date: 12/10/2013    Years since quitting: 8.7    Passive exposure: Never   Smokeless tobacco: Never   Tobacco comments:    Smoke 1-1 1/2 packs a day  Vaping Use   Vaping Use: Never used  Substance Use Topics   Alcohol use: No   Drug use: No       OPHTHALMIC EXAM: Base Eye Exam     Visual Acuity (Snellen - Linear)       Right Left   Dist [place] 20/30 -2 20/30 -1   Dist ph [place] NI NI         Tonometry (Tonopen, 9:52 AM)       Right Left   Pressure 09 09         Pupils       Dark Light Shape React APD   Right 2 1 Round Brisk None   Left 2 1 Round Brisk None         Visual Fields (Counting fingers)       Left Right    Full Full         Extraocular Movement       Right Left    Full, Ortho Full, Ortho         Neuro/Psych     Oriented x3: Yes   Mood/Affect: Normal  Dilation     Both eyes: 1.0% Mydriacyl, 2.5% Phenylephrine @ 9:52 AM           Slit Lamp and Fundus Exam     Slit Lamp Exam       Right Left   Lids/Lashes Dermatochalasis - upper lid, mild Meibomian gland dysfunction Dermatochalasis - upper lid, Telangiectasia, mild Meibomian gland dysfunction   Conjunctiva/Sclera White and quiet, superior bleb White and quiet   Cornea 1+Punctate epethelial erosions, well healed cataract wound 1+ fine Punctate epithelial erosions, arcus, mild EBMD   Anterior Chamber narrow temporal angle, tube at 1200 Deep and quiet, narrow temporal angle   Iris round and  mod dilated Round and moderately dilated to 5.37m   Lens Posterior chamber intraocular lens, open PC Posterior chamber intraocular lens, trace Posterior capsular opacification   Anterior Vitreous Vitreous syneresis, Posterior vitreous detachment Vitreous syneresis         Fundus Exam       Right Left   Disc Sharp rim, +Pallor, +cupping w/ superior and inferior rim thinning, temporal Peripapillary atrophy mild Pallor, Sharp rim, temporal Peripapillary atrophy   C/D Ratio 0.9 0.5   Macula Flat, Blunted foveal reflex, +focal CNV/PED nasal macula with partial pigment ring, trace IRF/ SRF, Drusen, RPE mottling and clumping, no heme Flat, Blunted foveal reflex, fine drusen, mild ERM, Retinal pigment epithelial mottling and clumping, No heme or edema   Vessels attenuated, Tortuous attenuated, Tortuous   Periphery Attached, mild reticular degeneration, No heme, No RT/RD Attached; no heme           IMAGING AND PROCEDURES  Imaging and Procedures for '@TODAY'$ @  OCT, Retina - OU - Both Eyes       Right Eye Quality was good. Central Foveal Thickness: 205. Progression has improved. Findings include normal foveal contour, retinal drusen , subretinal hyper-reflective material, intraretinal hyper-reflective material, intraretinal fluid, pigment epithelial detachment, subretinal fluid, outer retinal atrophy (Persistent focal IRF/SRF overlying nasal PED / CNV-- slightly improved; partial PVD).   Left Eye Quality was good. Central Foveal Thickness: 211. Progression has been stable. Findings include normal foveal contour, no IRF, no SRF, retinal drusen (Partial PVD, patchy ORA).   Notes *Images captured and stored on drive  Diagnosis / Impression:  OD: exudative ARMD; Persistent focal IRF/SRF overlying nasal PED / CNV--slightly improved; partial PVD OS: NFP, no IRF/SRF; +drusen -- nonexudative ARMD  Clinical management:  See below  Abbreviations: NFP - Normal foveal profile. CME - cystoid  macular edema. PED - pigment epithelial detachment. IRF - intraretinal fluid. SRF - subretinal fluid. EZ - ellipsoid zone. ERM - epiretinal membrane. ORA - outer retinal atrophy. ORT - outer retinal tubulation. SRHM - subretinal hyper-reflective material      Intravitreal Injection, Pharmacologic Agent - OD - Right Eye       Time Out 09/02/2022. 10:49 AM. Confirmed correct patient, procedure, site, and patient consented.   Anesthesia Topical anesthesia was used. Anesthetic medications included Lidocaine 2%, Proparacaine 0.5%.   Procedure Preparation included 5% betadine to ocular surface, eyelid speculum. A (32g) needle was used.   Injection: 1.25 mg Bevacizumab 1.'25mg'$ /0.020m  Route: Intravitreal, Site: Right Eye   NDC: [id number]: [id number] date: 11/13/2022   Post-op Post injection exam found visual acuity of at least counting fingers. The patient tolerated the procedure well. There were no complications. The patient received written and verbal post procedure care education. Post injection medications were not given.  ASSESSMENT/PLAN:   ICD-10-CM   1. Exudative age-related macular degeneration of right eye with active choroidal neovascularization (HCC)  H35.3211 OCT, Retina - OU - Both Eyes    Intravitreal Injection, Pharmacologic Agent - OD - Right Eye    Bevacizumab (AVASTIN) SOLN 1.25 mg    2. Intermediate stage nonexudative age-related macular degeneration of left eye  H35.3122     3. Diabetes mellitus type 2 without retinopathy ([place])  E11.9     4. Essential hypertension  I10     5. Hypertensive retinopathy of both eyes  H35.033     6. Pseudophakia of both eyes  Z96.1     7. PCO (posterior capsular opacification), right  H26.491     8. Primary open angle glaucoma of both eyes, unspecified glaucoma stage  H40.1130      1. 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, OD  - delayed f/u -- 3 mos instead of 8 wks due to other health  issues  - h/o delayed f/u from 8 wks to 12 on 6.20.22 due to lumpectomy -- dx'd w/ DCIS - h/o delayed follow up from 4 weeks to 8 weeks due to passing of husband (12.11.20-02.12.21)  - s/p IVA OD #1 (12.11.20), #2 (02.12.21), #3 (03.12.21), #4 (04.09.21), #5 (05.14.21), #6 (06.17.21), #7 (07.23.21), #8 (10.15.21), #9 (12.7.21), #10 (03.22.22), #11 (06.20.22), #12 (08.24.22), #13 (10.31.22), #14 (01.03.23), #15 (08.15.23), #16 (11.15.23)  - FA 12.11.20 confirms +CNVM  **history of increased fluid at 7+ months noted on 08.15.23** - OCT today shows Persistent focal IRF/SRF overlying nasal PED / CNV- --slightly improved at 3 mos  - BCVA OD stable at 20/30  - recommend IVA OD #17 today, 02.29.24 with follow up back to 8 weeks  - pt wishes to proceed with injection  - RBA of procedure discussed, questions answered - informed consent obtained and signed - see procedure note  - Avastin informed consent form re-signed and scanned on 08.15.2023 (OD)             - Continue to use AT OD  - f/u 8 weeks -- DFE/OCT/possible injection  2. Age related macular degeneration, non-exudative, OS  - intermediate stage  - BCVA 20/30 - The incidence, anatomy, and pathology of dry AMD, risk of progression, and the AREDS and AREDS 2 study including smoking risks discussed with patient.   - recommend [place]ler grid monitoring  3. Diabetes mellitus, type 2 without retinopathy - The incidence, risk factors for progression, natural history and treatment options for diabetic retinopathy  were discussed with patient.   - The need for close monitoring of blood glucose, blood pressure, and serum lipids, avoiding cigarette or any type of tobacco, and the need for long term follow up was also discussed with patient.  - monitor   4,5. Hypertensive retinopathy OU  - discussed importance of tight BP control  - monitor   6,7. Pseudophakia OU, PCO OD  - s/p CE/IOL OU (Dr. Venetia Maxon)  - IOL in good position  - s/p yag cap OD  (04.06.22) -- good PC opening  - monitor   8. POAG OU  - formerly managed by Dr. Venetia Maxon -- now following [facility]  - s/p laser w/ Dr. Venetia Maxon -- ?SLT  - s/p trab OD w/ Dr. Loletha Grayer. [place]  - IOP 9 OU today  - currently on Latanoprost QHS  Ophthalmic Meds Ordered this visit:  Meds ordered this encounter  Medications   Bevacizumab (AVASTIN) SOLN 1.25 mg  Return in about 8 weeks (around 10/28/2022) for Ex. AMD OD, DFE, OCT, Possible Injxn.  There are no Patient Instructions on file for this visit.  This document serves as a record of services personally performed by Gardiner Sleeper, MD, PhD. It was created on their behalf by San Jetty. Owens Shark, OA an ophthalmic technician. The creation of this record is the provider's dictation and/or activities during the visit.    Electronically signed by: San Jetty. Owens Shark, [place] 02.27.2024 12:06 PM  This document serves as a record of services personally performed by Gardiner Sleeper, MD, PhD. It was created on their behalf by Renaldo Reel, [place] an ophthalmic technician. The creation of this record is the provider's dictation and/or activities during the visit.    Electronically signed by:  Renaldo Reel, COT  02.29.24 12:06 PM  Gardiner Sleeper, M.D., Ph.D. Diseases & Surgery of the Retina and Vitreous [facility]  I have reviewed the above documentation for accuracy and completeness, and I agree with the above. Gardiner Sleeper, M.D., Ph.D. 09/02/22 12:06 PM  Abbreviations: M myopia (nearsighted); A astigmatism; H hyperopia (farsighted); P presbyopia; Mrx spectacle prescription;  CTL contact lenses; OD right eye; OS left eye; OU both eyes  XT exotropia; ET esotropia; PEK punctate epithelial keratitis; PEE punctate epithelial erosions; DES dry eye syndrome; MGD meibomian gland dysfunction; ATs artificial tears; PFAT's preservative free artificial tears; [place] nuclear sclerotic cataract; PSC posterior subcapsular  cataract; ERM epi-retinal membrane; PVD posterior vitreous detachment; RD retinal detachment; DM diabetes mellitus; DR diabetic retinopathy; NPDR non-proliferative diabetic retinopathy; PDR proliferative diabetic retinopathy; CSME clinically significant macular edema; DME diabetic macular edema; dbh dot blot hemorrhages; CWS cotton wool spot; POAG primary open angle glaucoma; C/D cup-to-disc ratio; HVF humphrey visual field; GVF goldmann visual field; OCT optical coherence tomography; IOP intraocular pressure; BRVO Branch retinal vein occlusion; CRVO central retinal vein occlusion; CRAO central retinal artery occlusion; BRAO branch retinal artery occlusion; RT retinal tear; SB scleral buckle; PPV pars plana vitrectomy; VH Vitreous hemorrhage; PRP panretinal laser photocoagulation; IVK intravitreal kenalog; VMT vitreomacular traction; MH Macular hole;  NVD neovascularization of the disc; NVE neovascularization elsewhere; AREDS age related eye disease study; ARMD age related macular degeneration; POAG primary open angle glaucoma; EBMD epithelial/anterior basement membrane dystrophy; ACIOL anterior chamber intraocular lens; IOL intraocular lens; PCIOL posterior chamber intraocular lens; Phaco/IOL phacoemulsification with intraocular lens placement; [place] photorefractive keratectomy; LASIK laser assisted in situ keratomileusis; HTN hypertension; DM diabetes mellitus; COPD chronic obstructive pulmonary disease

## 2022-09-02 ENCOUNTER — Encounter (INDEPENDENT_AMBULATORY_CARE_PROVIDER_SITE_OTHER): Payer: Self-pay | Admitting: Ophthalmology

## 2022-09-02 ENCOUNTER — Ambulatory Visit (INDEPENDENT_AMBULATORY_CARE_PROVIDER_SITE_OTHER): Payer: PPO | Admitting: Ophthalmology

## 2022-09-02 DIAGNOSIS — H353122 Nonexudative age-related macular degeneration, left eye, intermediate dry stage: Secondary | ICD-10-CM

## 2022-09-02 DIAGNOSIS — H353211 Exudative age-related macular degeneration, right eye, with active choroidal neovascularization: Secondary | ICD-10-CM

## 2022-09-02 DIAGNOSIS — E119 Type 2 diabetes mellitus without complications: Secondary | ICD-10-CM

## 2022-09-02 DIAGNOSIS — H401130 Primary open-angle glaucoma, bilateral, stage unspecified: Secondary | ICD-10-CM | POA: Diagnosis not present

## 2022-09-02 DIAGNOSIS — H35033 Hypertensive retinopathy, bilateral: Secondary | ICD-10-CM

## 2022-09-02 DIAGNOSIS — I10 Essential (primary) hypertension: Secondary | ICD-10-CM

## 2022-09-02 DIAGNOSIS — H26491 Other secondary cataract, right eye: Secondary | ICD-10-CM

## 2022-09-02 DIAGNOSIS — Z961 Presence of intraocular lens: Secondary | ICD-10-CM

## 2022-09-02 MED ORDER — BEVACIZUMAB CHEMO INJECTION 1.25MG/0.05ML SYRINGE FOR KALEIDOSCOPE
1.2500 mg | INTRAVITREAL | Status: AC | PRN
  Administered 2022-09-02: 1.25 mg via INTRAVITREAL

## 2022-09-06 ENCOUNTER — Ambulatory Visit: Payer: HMO | Attending: Cardiology | Admitting: *Deleted

## 2022-09-06 DIAGNOSIS — Z5181 Encounter for therapeutic drug level monitoring: Secondary | ICD-10-CM

## 2022-09-06 DIAGNOSIS — I82A12 Acute embolism and thrombosis of left axillary vein: Secondary | ICD-10-CM | POA: Diagnosis not present

## 2022-09-06 LAB — POCT INR: INR: 2.2 (ref 2.0–3.0)

## 2022-09-06 NOTE — Patient Instructions (Signed)
Continue warfarin 1 tablet daily   Recheck INR in 4 wk.  Call Coumadin clinic for any questions or changes in medications.

## 2022-09-09 DIAGNOSIS — Z1331 Encounter for screening for depression: Secondary | ICD-10-CM | POA: Diagnosis not present

## 2022-09-09 DIAGNOSIS — E782 Mixed hyperlipidemia: Secondary | ICD-10-CM | POA: Diagnosis not present

## 2022-09-09 DIAGNOSIS — E063 Autoimmune thyroiditis: Secondary | ICD-10-CM | POA: Diagnosis not present

## 2022-09-09 DIAGNOSIS — Z0001 Encounter for general adult medical examination with abnormal findings: Secondary | ICD-10-CM | POA: Diagnosis not present

## 2022-09-09 DIAGNOSIS — F419 Anxiety disorder, unspecified: Secondary | ICD-10-CM | POA: Diagnosis not present

## 2022-09-09 DIAGNOSIS — E1165 Type 2 diabetes mellitus with hyperglycemia: Secondary | ICD-10-CM | POA: Diagnosis not present

## 2022-09-09 DIAGNOSIS — Z6827 Body mass index (BMI) 27.0-27.9, adult: Secondary | ICD-10-CM | POA: Diagnosis not present

## 2022-09-09 DIAGNOSIS — E1159 Type 2 diabetes mellitus with other circulatory complications: Secondary | ICD-10-CM | POA: Diagnosis not present

## 2022-09-09 DIAGNOSIS — J449 Chronic obstructive pulmonary disease, unspecified: Secondary | ICD-10-CM | POA: Diagnosis not present

## 2022-09-09 DIAGNOSIS — E663 Overweight: Secondary | ICD-10-CM | POA: Diagnosis not present

## 2022-09-09 DIAGNOSIS — I2510 Atherosclerotic heart disease of native coronary artery without angina pectoris: Secondary | ICD-10-CM | POA: Diagnosis not present

## 2022-09-09 DIAGNOSIS — K219 Gastro-esophageal reflux disease without esophagitis: Secondary | ICD-10-CM | POA: Diagnosis not present

## 2022-09-09 DIAGNOSIS — H353122 Nonexudative age-related macular degeneration, left eye, intermediate dry stage: Secondary | ICD-10-CM | POA: Diagnosis not present

## 2022-09-29 NOTE — Progress Notes (Signed)
[facility] Note  10/01/2022     CHIEF COMPLAINT Patient presents for Retina Follow Up  HISTORY OF PRESENT ILLNESS: [name] is a 82 y.o. female who presents to the clinic today for:  HPI     Retina Follow Up   Patient presents with  Wet AMD.  In both eyes.  This started years ago.  Duration of 4 weeks.  I, the attending physician,  performed the HPI with the patient and updated documentation appropriately.        Comments   Patient feels that at times the vision is more blurry. She is using Cosopt OD BID and Latanoprost OS QHS. She does not check her sugars due to a broken device.      Last edited by Bernarda Caffey, MD on 10/01/2022 12:29 PM.      Patient   Referring physician: Sharilyn Sites, MD [address] [place],  [place] [address]  HISTORICAL INFORMATION:  Selected notes from the MEDICAL RECORD NUMBER Referred by Dr. Madelin Headings for concern of SRF OD LEE: 11.27.20 (M. Cotter) [BCVA: OD: 20/80-- OS: 20/60-]  Ocular Hx-glaucoma (latanoprost)  PMH-DM    CURRENT MEDICATIONS: Current Outpatient Medications (Ophthalmic Drugs)  Medication Sig   dorzolamide-timolol (COSOPT) 22.3-6.8 MG/ML ophthalmic solution INSTILL 1 DROP INTO RIGHT EYE TWICE A DAY   latanoprost (XALATAN) 0.005 % ophthalmic solution Place 1 drop into both eyes at bedtime.   Polyethyl Glycol-Propyl Glycol (SYSTANE OP) Place 1 drop into the left eye daily as needed (dry eye).   prednisoLONE acetate (PRED FORTE) 1 % ophthalmic suspension Place 1 drop into the right eye 2 (two) times daily. Taper   No current facility-administered medications for this visit. (Ophthalmic Drugs)   Current Outpatient Medications (Other)  Medication Sig   albuterol (PROVENTIL HFA;VENTOLIN HFA) 108 (90 BASE) MCG/ACT inhaler Inhale 2 puffs into the lungs every 4 (four) hours as needed for shortness of breath.   amitriptyline (ELAVIL) 25 MG tablet Take 25 mg by mouth at bedtime.    anastrozole (ARIMIDEX) 1 MG tablet TAKE 1 TABLET BY MOUTH EVERY DAY (Patient taking differently: Take 1 mg by mouth at bedtime.)   carvedilol (COREG) 6.25 MG tablet Take 1 tablet (6.25 mg total) by mouth 2 (two) times daily.   cetirizine (ZYRTEC) 10 MG tablet Take 10 mg by mouth daily.   Cholecalciferol (VITAMIN D-3) 1000 units CAPS Take 1,000 Units by mouth daily.   diazepam (VALIUM) 2 MG tablet Take 2 mg by mouth 2 (two) times daily as needed for anxiety (dizziness).   doxycycline (VIBRAMYCIN) 100 MG capsule Take 1 capsule (100 mg total) by mouth 2 (two) times daily.   esomeprazole (NEXIUM) 20 MG capsule Take 20 mg by mouth daily at 12 noon.   FARXIGA 5 MG TABS tablet Take 5 mg by mouth every morning.   fluticasone (FLOVENT HFA) 110 MCG/ACT inhaler Inhale 1 puff into the lungs 2 (two) times daily. Rinse mouth with water after each use   furosemide (LASIX) 40 MG tablet TAKE 1 TABLET (40 MG TOTAL) BY MOUTH DAILY. MAY TAKE EXTRA DAILY AS NEEDED FOR SWELLING   gabapentin (NEURONTIN) 300 MG capsule Take 300 mg by mouth 3 (three) times daily.   glimepiride (AMARYL) 2 MG tablet Take 2 mg by mouth daily.   hydrALAZINE (APRESOLINE) 25 MG tablet TAKE ONE TABLET BY MOUTH AT BREAKFAST AND AT BEDTIME   isosorbide mononitrate (IMDUR) 30 MG 24 hr tablet TAKE ONE TABLET BY  MOUTH ONCE DAILY   meclizine (ANTIVERT) 25 MG tablet Take 25 mg by mouth 2 (two) times daily as needed for dizziness.   Multiple Vitamins-Minerals (PRESERVISION AREDS 2 PO) Take 1 capsule by mouth in the morning and at bedtime.   Omega-3 Fatty Acids (FISH OIL) 1200 MG CAPS Take 1,200 mg by mouth 2 (two) times daily.   polyethylene glycol (MIRALAX / GLYCOLAX) packet Take 17 g by mouth daily.   potassium chloride SA (KLOR-CON M) 20 MEQ tablet TAKE ONE-HALF TABLET BY  MOUTH DAILY   PRALUENT 75 MG/ML SOAJ INJECT 75 MG into THE SKIN EVERY 14 DAYS   promethazine-dextromethorphan (PROMETHAZINE-DM) 6.25-15 MG/5ML syrup Take 5 mLs by mouth 4 (four)  times daily as needed.   TRADJENTA 5 MG TABS tablet Take 5 mg by mouth daily.   vitamin B-12 (CYANOCOBALAMIN) 100 MCG tablet Take 100 mcg by mouth daily.   warfarin (COUMADIN) 2 MG tablet TAKE ONE TABLET BY MOUTH EVERYDAY AT BEDTIME AS DIRECTED by THE coumadin Clinic   No current facility-administered medications for this visit. (Other)   REVIEW OF SYSTEMS: ROS   Positive for: Genitourinary, Endocrine, Cardiovascular, Eyes Negative for: Constitutional, Gastrointestinal, Neurological, Skin, Musculoskeletal, HENT, Respiratory, Psychiatric, Allergic/Imm, Heme/Lymph Last edited by Annie Paras, COT on 10/01/2022  8:05 AM.      ALLERGIES Allergies  Allergen Reactions   Alphagan [Brimonidine] Itching   Diflunisal Swelling    Other reaction(s): ENTIRE BODY SWELLING   Vioxx [Rofecoxib] Shortness Of Breath   Metformin And Related     Kidney failure   Nexlizet [Bempedoic Acid-Ezetimibe]     Causes elevated Liver and Kidney function   Repatha [Evolocumab]     MYALGIAS   Codeine Rash   Elemental Sulfur Rash   Motrin [Ibuprofen] Rash   Penicillins Rash   Pravastatin Rash   PAST MEDICAL HISTORY Past Medical History:  Diagnosis Date   Antral gastritis    EGD 11/15   Asthmatic bronchitis    Back pain    Breast cancer ([place])    right breast   CAD in native artery 03/10/2021   Chronic diastolic heart failure ([place]) 05/20/2015   Grade 2 diastolic dysfunction.  04/2015.   Chronic kidney disease    kidney function low   Diabetes mellitus    x 5 yrs   DVT of axillary vein, acute left ([place]) 07/24/2012   GERD (gastroesophageal reflux disease)    Glaucoma    POAG OU   Heart murmur    History of hiatal hernia    History of kidney stones    Hyperlipidemia 05/20/2015   Hypertension    Hypertensive retinopathy    OU   Hypothyroidism    Kidney stones    Macular degeneration    Wet OD, Dry OS   Mixed hyperlipidemia    OSA (obstructive sleep apnea) 12/17/2021   Peripheral  venous insufficiency    Pinched nerve    right elbow   Pneumonia    PONV (postoperative nausea and vomiting)    Sigmoid diverticulitis    Snoring 03/10/2021   Vertigo    chonic   Past Surgical History:  Procedure Laterality Date   ABDOMINAL HYSTERECTOMY     BACK SURGERY     spinal    BREAST LUMPECTOMY WITH RADIOACTIVE SEED LOCALIZATION Right 12/03/2020   Procedure: RIGHT BREAST LUMPECTOMY WITH RADIOACTIVE SEED LOCALIZATION;  Surgeon: Donnie Mesa, MD;  Location: [facility];  Service: General;  Laterality: Right;   CARDIAC CATHETERIZATION N/A 05/26/2015  Procedure: Left Heart Cath and Coronary Angiography;  Surgeon: Jettie Booze, MD;  Location: [facility];  Service: Cardiovascular;  Laterality: N/A;   CATARACT EXTRACTION Bilateral    CHOLECYSTECTOMY     COLON SURGERY     COLONOSCOPY N/A 12/27/2013   Procedure: COLONOSCOPY;  Surgeon: Rogene Houston, MD;  [facility]: [facility];  Service: Endoscopy;  Laterality: N/A;  200   COLOSTOMY CLOSURE     ENDARTERECTOMY Left 03/10/2022   Procedure: LEFT CAROTID ENDARTERECTOMY;  Surgeon: Cherre Robins, MD;  Location: [facility];  Service: Vascular;  Laterality: Left;   ESOPHAGOGASTRODUODENOSCOPY N/A 05/07/2014   Procedure: ESOPHAGOGASTRODUODENOSCOPY (EGD);  Surgeon: Rogene Houston, MD;  [facility];  Service: Endoscopy;  Laterality: N/A;   EYE SURGERY Bilateral    Cat Sx   fracture left foot     HERNIA REPAIR     NM MYOCAR PERF WALL MOTION  01/28/2009   Normal   OTHER SURGICAL HISTORY     colostomy, colostomy reversal, for diverticulitis surgical hernia repair, arm surgery, neck surgery   PATCH ANGIOPLASTY Left 03/10/2022   Procedure: PATCH ANGIOPLASTY WITH 1X6CM Rueben Bash PATCH;  Surgeon: Cherre Robins, MD;  Location: [facility];  Service: Vascular;  Laterality: Left;   US ECHOCARDIOGRAPHY  02/11/2010   Mild MR,trace TR & AI   FAMILY HISTORY Family History  Problem Relation Age of Onset   CVA Maternal Grandmother 90        deceased   Heart disease Maternal Grandmother    Stroke Maternal Grandmother    Breast cancer Maternal Grandmother    Other Mother 12       Cause unknown   Cancer Mother        liver   Heart attack Brother 36       deceased   Breast cancer Sister    Leukemia Maternal Aunt    Glaucoma Maternal Uncle    Bone cancer Maternal Uncle    Spina bifida Daughter    SOCIAL HISTORY Social History   Tobacco Use   Smoking status: Former    Types: Cigarettes    Quit date: 12/10/2013    Years since quitting: 8.8    Passive exposure: Never   Smokeless tobacco: Never   Tobacco comments:    Smoke 1-1 1/2 packs a day  Vaping Use   Vaping Use: Never used  Substance Use Topics   Alcohol use: No   Drug use: No       OPHTHALMIC EXAM: Base Eye Exam     Visual Acuity (Snellen - Linear)       Right Left   Dist [place] 20/30 20/30   Dist ph [place] NI NI         Tonometry (Tonopen, 8:11 AM)       Right Left   Pressure 13 11         Pupils       Dark Light Shape React APD   Right 2 1 Round Brisk None   Left 2 1 Round Brisk None         Visual Fields       Left Right    Full Full         Extraocular Movement       Right Left    Full, Ortho Full, Ortho         Neuro/Psych     Oriented x3: Yes   Mood/Affect: Normal  Dilation     Both eyes: 1.0% Mydriacyl, 2.5% Phenylephrine @ 8:08 AM           Slit Lamp and Fundus Exam     Slit Lamp Exam       Right Left   Lids/Lashes Dermatochalasis - upper lid, mild Meibomian gland dysfunction Dermatochalasis - upper lid, Telangiectasia, mild Meibomian gland dysfunction   Conjunctiva/Sclera White and quiet, superior bleb White and quiet   Cornea 1+Punctate epethelial erosions, well healed cataract wound 1+ fine Punctate epithelial erosions, arcus, mild EBMD   Anterior Chamber narrow temporal angle, tube at 1200 Deep and quiet, narrow temporal angle   Iris round and mod dilated Round and moderately dilated to  5.64mm   Lens Posterior chamber intraocular lens, open PC Posterior chamber intraocular lens, trace Posterior capsular opacification   Anterior Vitreous Vitreous syneresis, Posterior vitreous detachment Vitreous syneresis         Fundus Exam       Right Left   Disc Sharp rim, +Pallor, +cupping w/ superior and inferior rim thinning, temporal Peripapillary atrophy mild Pallor, Sharp rim, temporal Peripapillary atrophy   C/D Ratio 0.9 0.5   Macula Flat, Blunted foveal reflex, +focal CNV/PED nasal macula with partial pigment ring, trace IRF/ SRF -- slightly improved, Drusen, RPE mottling and clumping, no heme Flat, Blunted foveal reflex, fine drusen, mild ERM, Retinal pigment epithelial mottling and clumping, No heme or edema   Vessels attenuated, Tortuous attenuated, Tortuous   Periphery Attached, mild reticular degeneration, No heme, No RT/RD Attached; no heme           IMAGING AND PROCEDURES  Imaging and Procedures for @TODAY @  OCT, Retina - OU - Both Eyes       Right Eye Quality was good. Central Foveal Thickness: 198. Progression has been stable. Findings include normal foveal contour, retinal drusen , subretinal hyper-reflective material, intraretinal hyper-reflective material, intraretinal fluid, pigment epithelial detachment, subretinal fluid, outer retinal atrophy (Persistent focal IRF/SRF overlying nasal PED / CNV -- stably improved; partial PVD).   Left Eye Quality was good. Central Foveal Thickness: 208. Progression has been stable. Findings include normal foveal contour, no IRF, no SRF, retinal drusen (Partial PVD, patchy ORA).   Notes *Images captured and stored on drive  Diagnosis / Impression:  OD: exudative ARMD; Persistent focal IRF/SRF overlying nasal PED / CNV--stably improved; partial PVD OS: NFP, no IRF/SRF; +drusen -- nonexudative ARMD  Clinical management:  See below  Abbreviations: NFP - Normal foveal profile. CME - cystoid macular edema. PED - pigment  epithelial detachment. IRF - intraretinal fluid. SRF - subretinal fluid. EZ - ellipsoid zone. ERM - epiretinal membrane. ORA - outer retinal atrophy. ORT - outer retinal tubulation. SRHM - subretinal hyper-reflective material      Intravitreal Injection, Pharmacologic Agent - OD - Right Eye       Time Out 10/01/2022. 8:15 AM. Confirmed correct patient, procedure, site, and patient consented.   Anesthesia Topical anesthesia was used. Anesthetic medications included Lidocaine 2%, Proparacaine 0.5%.   Procedure Preparation included 5% betadine to ocular surface, eyelid speculum. A (32g) needle was used.   Injection: 1.25 mg Bevacizumab 1.25mg /0.12ml   Route: Intravitreal, Site: Right Eye   NDC: [id number], Lot: [id number], Expiration date: 01/02/2023   Post-op Post injection exam found visual acuity of at least counting fingers. The patient tolerated the procedure well. There were no complications. The patient received written and verbal post procedure care education. Post injection medications were not given.  ASSESSMENT/PLAN:   ICD-10-CM   1. Exudative age-related macular degeneration of right eye with active choroidal neovascularization (HCC)  H35.3211 OCT, Retina - OU - Both Eyes    Intravitreal Injection, Pharmacologic Agent - OD - Right Eye    Bevacizumab (AVASTIN) SOLN 1.25 mg    2. Intermediate stage nonexudative age-related macular degeneration of left eye  H35.3122     3. Diabetes mellitus type 2 without retinopathy ([place])  E11.9     4. Essential hypertension  I10     5. Hypertensive retinopathy of both eyes  H35.033     6. Pseudophakia of both eyes  Z96.1     7. PCO (posterior capsular opacification), right  H26.491     8. Primary open angle glaucoma of both eyes, unspecified glaucoma stage  H40.1130      1. 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, OD  - delayed f/u -- 3 mos instead of 8 wks due to other health issues  - h/o delayed f/u from 8  wks to 12 on 6.20.22 due to lumpectomy -- dx'd w/ DCIS - h/o delayed follow up from 4 weeks to 8 weeks due to passing of husband (12.11.20-02.12.21)  - s/p IVA OD #1 (12.11.20), #2 (02.12.21), #3 (03.12.21), #4 (04.09.21), #5 (05.14.21), #6 (06.17.21), #7 (07.23.21), #8 (10.15.21), #9 (12.7.21), #10 (03.22.22), #11 (06.20.22), #12 (08.24.22), #13 (10.31.22), #14 (01.03.23), #15 (08.15.23), #16 (11.15.23), #17 (02.29.24)  - FA 12.11.20 confirms +CNVM  **history of increased fluid at 7+ months noted on 08.15.23** - OCT today shows Persistent focal IRF/SRF overlying nasal PED / CNV --stably improved at 4 weeks  - BCVA OD stable at 20/30  - recommend IVA OD #18 today, 03.39.24 with follow up ext to 6 weeks  - pt wishes to proceed with injection  - RBA of procedure discussed, questions answered - informed consent obtained and signed - see procedure note  - Avastin informed consent form re-signed and scanned on 08.15.2023 (OD)             - Continue to use AT OD  - f/u 6 weeks -- DFE/OCT/possible injection  2. Age related macular degeneration, non-exudative, OS  - intermediate stage  - BCVA 20/30 - The incidence, anatomy, and pathology of dry AMD, risk of progression, and the AREDS and AREDS 2 study including smoking risks discussed with patient.   - recommend Amsler grid monitoring  3. Diabetes mellitus, type 2 without retinopathy - The incidence, risk factors for progression, natural history and treatment options for diabetic retinopathy  were discussed with patient.   - The need for close monitoring of blood glucose, blood pressure, and serum lipids, avoiding cigarette or any type of tobacco, and the need for long term follow up was also discussed with patient.  - monitor   4,5. Hypertensive retinopathy OU  - discussed importance of tight BP control  - monitor   6,7. Pseudophakia OU, PCO OD  - s/p CE/IOL OU (Dr. Venetia Maxon)  - IOL in good position  - s/p yag cap OD (04.06.22) -- good PC  opening  - monitor   8. POAG OU  - formerly managed by Dr. Venetia Maxon -- now following at [facility]  - s/p laser w/ Dr. Venetia Maxon -- ?SLT  - s/p trab OD w/ Dr. Loletha Grayer. [place]  - IOP 13,11 OU today  - currently on Latanoprost QHS  Ophthalmic Meds Ordered this visit:  Meds ordered this encounter  Medications   Bevacizumab (AVASTIN) SOLN 1.25 mg  Return in about 6 weeks (around 11/12/2022) for f/u exu ARMD OD, DFE, OCT.  There are no Patient Instructions on file for this visit.  This document serves as a record of services personally performed by Gardiner Sleeper, MD, PhD. It was created on their behalf by San Jetty. Owens Shark, OA an ophthalmic technician. The creation of this record is the provider's dictation and/or activities during the visit.    Electronically signed by: San Jetty. Owens Shark, [place] 03.27.2024 1:49 AM   Gardiner Sleeper, M.D., Ph.D. Diseases & Surgery of the Retina and Vitreous [facility]  I have reviewed the above documentation for accuracy and completeness, and I agree with the above. Gardiner Sleeper, M.D., Ph.D. 10/02/22 1:52 AM   Abbreviations: M myopia (nearsighted); A astigmatism; H hyperopia (farsighted); P presbyopia; Mrx spectacle prescription;  CTL contact lenses; OD right eye; OS left eye; OU both eyes  XT exotropia; ET esotropia; PEK punctate epithelial keratitis; PEE punctate epithelial erosions; DES dry eye syndrome; MGD meibomian gland dysfunction; ATs artificial tears; PFAT's preservative free artificial tears; [place] nuclear sclerotic cataract; PSC posterior subcapsular cataract; ERM epi-retinal membrane; PVD posterior vitreous detachment; RD retinal detachment; DM diabetes mellitus; DR diabetic retinopathy; NPDR non-proliferative diabetic retinopathy; PDR proliferative diabetic retinopathy; CSME clinically significant macular edema; DME diabetic macular edema; dbh dot blot hemorrhages; CWS cotton wool spot; POAG primary open angle glaucoma; C/D  cup-to-disc ratio; HVF humphrey visual field; GVF goldmann visual field; OCT optical coherence tomography; IOP intraocular pressure; BRVO Branch retinal vein occlusion; CRVO central retinal vein occlusion; CRAO central retinal artery occlusion; BRAO branch retinal artery occlusion; RT retinal tear; SB scleral buckle; PPV pars plana vitrectomy; VH Vitreous hemorrhage; PRP panretinal laser photocoagulation; IVK intravitreal kenalog; VMT vitreomacular traction; MH Macular hole;  NVD neovascularization of the disc; NVE neovascularization elsewhere; AREDS age related eye disease study; ARMD age related macular degeneration; POAG primary open angle glaucoma; EBMD epithelial/anterior basement membrane dystrophy; ACIOL anterior chamber intraocular lens; IOL intraocular lens; PCIOL posterior chamber intraocular lens; Phaco/IOL phacoemulsification with intraocular lens placement; [place] photorefractive keratectomy; LASIK laser assisted in situ keratomileusis; HTN hypertension; DM diabetes mellitus; COPD chronic obstructive pulmonary disease

## 2022-09-30 ENCOUNTER — Other Ambulatory Visit: Payer: Self-pay | Admitting: Family Medicine

## 2022-10-01 ENCOUNTER — Encounter (INDEPENDENT_AMBULATORY_CARE_PROVIDER_SITE_OTHER): Payer: Self-pay | Admitting: Ophthalmology

## 2022-10-01 ENCOUNTER — Ambulatory Visit (INDEPENDENT_AMBULATORY_CARE_PROVIDER_SITE_OTHER): Payer: PPO | Admitting: Ophthalmology

## 2022-10-01 DIAGNOSIS — H353211 Exudative age-related macular degeneration, right eye, with active choroidal neovascularization: Secondary | ICD-10-CM

## 2022-10-01 DIAGNOSIS — Z961 Presence of intraocular lens: Secondary | ICD-10-CM

## 2022-10-01 DIAGNOSIS — H401130 Primary open-angle glaucoma, bilateral, stage unspecified: Secondary | ICD-10-CM

## 2022-10-01 DIAGNOSIS — I10 Essential (primary) hypertension: Secondary | ICD-10-CM

## 2022-10-01 DIAGNOSIS — H35033 Hypertensive retinopathy, bilateral: Secondary | ICD-10-CM | POA: Diagnosis not present

## 2022-10-01 DIAGNOSIS — H26491 Other secondary cataract, right eye: Secondary | ICD-10-CM | POA: Diagnosis not present

## 2022-10-01 DIAGNOSIS — H353122 Nonexudative age-related macular degeneration, left eye, intermediate dry stage: Secondary | ICD-10-CM

## 2022-10-01 DIAGNOSIS — E119 Type 2 diabetes mellitus without complications: Secondary | ICD-10-CM

## 2022-10-01 MED ORDER — BEVACIZUMAB CHEMO INJECTION 1.25MG/0.05ML SYRINGE FOR KALEIDOSCOPE
1.2500 mg | INTRAVITREAL | Status: AC | PRN
  Administered 2022-10-01: 1.25 mg via INTRAVITREAL

## 2022-10-01 NOTE — Telephone Encounter (Signed)
Requested Prescriptions  Pending Prescriptions Disp Refills   FLOVENT HFA 110 MCG/ACT inhaler [Pharmacy Med Name: [facility] HFA 110 MCG INHALER] 12 each     Sig: INHALE 1 PUFF INTO THE LUNGS 2 (TWO) TIMES DAILY. RINSE MOUTH WITH WATER AFTER EACH USE     There is no refill protocol information for this order

## 2022-10-05 ENCOUNTER — Ambulatory Visit: Payer: PPO | Attending: Cardiology | Admitting: *Deleted

## 2022-10-05 DIAGNOSIS — I82A12 Acute embolism and thrombosis of left axillary vein: Secondary | ICD-10-CM

## 2022-10-05 DIAGNOSIS — Z5181 Encounter for therapeutic drug level monitoring: Secondary | ICD-10-CM

## 2022-10-05 LAB — POCT INR: INR: 2.5 (ref 2.0–3.0)

## 2022-10-05 NOTE — Patient Instructions (Signed)
Continue warfarin 1 tablet daily   Recheck INR in 6 wk.  Call Coumadin clinic for any questions or changes in medications.

## 2022-10-25 NOTE — Progress Notes (Unsigned)
History of Present Illness:  Patient is a 82 y.o. year old female who presents for evaluation of carotid stenosis.  She underwent evaluation for carotid bruit and underwent duplex [facility]. This revealed critical stenosis of her left internal carotid artery.   She is s/p left CEA by Dr. Lenell Antu 03/10/2022.  The right ICA is < 39% stenosed. The patient denies symptoms of TIA, amaurosis, or stroke.   She has an allergy to statins. She is managed with Praluent 75 mg injection every 14 days. She is on Coumadin for DVT history.    She is independent and stays fairly active.  She is not a smoker.       Past Medical History:  Diagnosis Date   Antral gastritis    EGD 11/15   Asthmatic bronchitis    Back pain    Breast cancer    right breast   CAD in native artery 03/10/2021   Chronic diastolic heart failure 05/20/2015   Grade 2 diastolic dysfunction.  04/2015.   Chronic kidney disease    kidney function low   Diabetes mellitus    x 5 yrs   DVT of axillary vein, acute left 07/24/2012   GERD (gastroesophageal reflux disease)    Glaucoma    POAG OU   Heart murmur    History of hiatal hernia    History of kidney stones    Hyperlipidemia 05/20/2015   Hypertension    Hypertensive retinopathy    OU   Hypothyroidism    Kidney stones    Macular degeneration    Wet OD, Dry OS   Mixed hyperlipidemia    OSA (obstructive sleep apnea) 12/17/2021   Peripheral venous insufficiency    Pinched nerve    right elbow   Pneumonia    PONV (postoperative nausea and vomiting)    Sigmoid diverticulitis    Snoring 03/10/2021   Vertigo    chonic    Past Surgical History:  Procedure Laterality Date   ABDOMINAL HYSTERECTOMY     BACK SURGERY     spinal    BREAST LUMPECTOMY WITH RADIOACTIVE SEED LOCALIZATION Right 12/03/2020   Procedure: RIGHT BREAST LUMPECTOMY WITH RADIOACTIVE SEED LOCALIZATION;  Surgeon: Manus Rudd, MD;  Location: [facility] OR;  Service: General;  Laterality:  Right;   CARDIAC CATHETERIZATION N/A 05/26/2015   Procedure: Left Heart Cath and Coronary Angiography;  Surgeon: Corky Crafts, MD;  Location: [facility] INVASIVE CV LAB;  Service: Cardiovascular;  Laterality: N/A;   CATARACT EXTRACTION Bilateral    CHOLECYSTECTOMY     COLON SURGERY     COLONOSCOPY N/A 12/27/2013   Procedure: COLONOSCOPY;  Surgeon: Malissa Hippo, MD;  Location: [facility];  Service: Endoscopy;  Laterality: N/A;  200   COLOSTOMY CLOSURE     ENDARTERECTOMY Left 03/10/2022   Procedure: LEFT CAROTID ENDARTERECTOMY;  Surgeon: Leonie Douglas, MD;  Location: [facility] OR;  Service: Vascular;  Laterality: Left;   ESOPHAGOGASTRODUODENOSCOPY N/A 05/07/2014   Procedure: ESOPHAGOGASTRODUODENOSCOPY (EGD);  Surgeon: Malissa Hippo, MD;  Location: [facility];  Service: Endoscopy;  Laterality: N/A;   EYE SURGERY Bilateral    Cat Sx   fracture left foot     HERNIA REPAIR     NM MYOCAR PERF WALL MOTION  01/28/2009   Normal   OTHER SURGICAL HISTORY     colostomy, colostomy reversal, for diverticulitis surgical hernia repair, arm surgery, neck surgery   PATCH ANGIOPLASTY Left 03/10/2022  Procedure: PATCH ANGIOPLASTY WITH 1X6CM Kathleen Lime;  Surgeon: Leonie Douglas, MD;  Location: [facility] OR;  Service: Vascular;  Laterality: Left;   US ECHOCARDIOGRAPHY  02/11/2010   Mild MR,trace TR & AI     Social History Social History   Tobacco Use   Smoking status: Former    Types: Cigarettes    Quit date: 12/10/2013    Years since quitting: 8.8    Passive exposure: Never   Smokeless tobacco: Never   Tobacco comments:    Smoke 1-1 1/2 packs a day  Vaping Use   Vaping Use: Never used  Substance Use Topics   Alcohol use: No   Drug use: No    Family History Family History  Problem Relation Age of Onset   CVA Maternal Grandmother 55       deceased   Heart disease Maternal Grandmother    Stroke Maternal Grandmother    Breast cancer Maternal Grandmother    Other Mother 56       Cause  unknown   Cancer Mother        liver   Heart attack Brother 32       deceased   Breast cancer Sister    Leukemia Maternal Aunt    Glaucoma Maternal Uncle    Bone cancer Maternal Uncle    Spina bifida Daughter     Allergies  Allergies  Allergen Reactions   Alphagan [Brimonidine] Itching   Diflunisal Swelling    Other reaction(s): ENTIRE BODY SWELLING   Vioxx [Rofecoxib] Shortness Of Breath   Metformin And Related     Kidney failure   Nexlizet [Bempedoic Acid-Ezetimibe]     Causes elevated Liver and Kidney function   Repatha [Evolocumab]     MYALGIAS   Codeine Rash   Elemental Sulfur Rash   Motrin [Ibuprofen] Rash   Penicillins Rash   Pravastatin Rash     Current Outpatient Medications  Medication Sig Dispense Refill   albuterol (PROVENTIL HFA;VENTOLIN HFA) 108 (90 BASE) MCG/ACT inhaler Inhale 2 puffs into the lungs every 4 (four) hours as needed for shortness of breath. 1 Inhaler 0   amitriptyline (ELAVIL) 25 MG tablet Take 25 mg by mouth at bedtime.     anastrozole (ARIMIDEX) 1 MG tablet TAKE 1 TABLET BY MOUTH EVERY DAY (Patient taking differently: Take 1 mg by mouth at bedtime.) 90 tablet 3   carvedilol (COREG) 6.25 MG tablet Take 1 tablet (6.25 mg total) by mouth 2 (two) times daily. 180 tablet 3   cetirizine (ZYRTEC) 10 MG tablet Take 10 mg by mouth daily.     Cholecalciferol (VITAMIN D-3) 1000 units CAPS Take 1,000 Units by mouth daily.     diazepam (VALIUM) 2 MG tablet Take 2 mg by mouth 2 (two) times daily as needed for anxiety (dizziness).     dorzolamide-timolol (COSOPT) 22.3-6.8 MG/ML ophthalmic solution INSTILL 1 DROP INTO RIGHT EYE TWICE A DAY 30 mL 2   doxycycline (VIBRAMYCIN) 100 MG capsule Take 1 capsule (100 mg total) by mouth 2 (two) times daily. 14 capsule 0   esomeprazole (NEXIUM) 20 MG capsule Take 20 mg by mouth daily at 12 noon.     FARXIGA 5 MG TABS tablet Take 5 mg by mouth every morning.     fluticasone (FLOVENT HFA) 110 MCG/ACT inhaler Inhale 1  puff into the lungs 2 (two) times daily. Rinse mouth with water after each use 1 each 0   furosemide (LASIX) 40 MG tablet TAKE 1 TABLET (  40 MG TOTAL) BY MOUTH DAILY. MAY TAKE EXTRA DAILY AS NEEDED FOR SWELLING 180 tablet 0   gabapentin (NEURONTIN) 300 MG capsule Take 300 mg by mouth 3 (three) times daily.     glimepiride (AMARYL) 2 MG tablet Take 2 mg by mouth daily.  2   hydrALAZINE (APRESOLINE) 25 MG tablet TAKE ONE TABLET BY MOUTH AT BREAKFAST AND AT BEDTIME 180 tablet 1   isosorbide mononitrate (IMDUR) 30 MG 24 hr tablet TAKE ONE TABLET BY MOUTH ONCE DAILY 90 tablet 3   latanoprost (XALATAN) 0.005 % ophthalmic solution Place 1 drop into both eyes at bedtime.     meclizine (ANTIVERT) 25 MG tablet Take 25 mg by mouth 2 (two) times daily as needed for dizziness.     Multiple Vitamins-Minerals (PRESERVISION AREDS 2 PO) Take 1 capsule by mouth in the morning and at bedtime.     Omega-3 Fatty Acids (FISH OIL) 1200 MG CAPS Take 1,200 mg by mouth 2 (two) times daily.     Polyethyl Glycol-Propyl Glycol (SYSTANE OP) Place 1 drop into the left eye daily as needed (dry eye).     polyethylene glycol (MIRALAX / GLYCOLAX) packet Take 17 g by mouth daily.     potassium chloride SA (KLOR-CON M) 20 MEQ tablet TAKE ONE-HALF TABLET BY  MOUTH DAILY 45 tablet 2   PRALUENT 75 MG/ML SOAJ INJECT 75 MG into THE SKIN EVERY 14 DAYS 2 mL 11   prednisoLONE acetate (PRED FORTE) 1 % ophthalmic suspension Place 1 drop into the right eye 2 (two) times daily. Taper     promethazine-dextromethorphan (PROMETHAZINE-DM) 6.25-15 MG/5ML syrup Take 5 mLs by mouth 4 (four) times daily as needed. 100 mL 0   TRADJENTA 5 MG TABS tablet Take 5 mg by mouth daily.     vitamin B-12 (CYANOCOBALAMIN) 100 MCG tablet Take 100 mcg by mouth daily.     warfarin (COUMADIN) 2 MG tablet TAKE ONE TABLET BY MOUTH EVERYDAY AT BEDTIME AS DIRECTED by THE coumadin Clinic 40 tablet 3   No current facility-administered medications for this visit.    ROS:    General:  No weight loss, Fever, chills  HEENT: No recent headaches, no nasal bleeding, no visual changes, no sore throat  Neurologic: No dizziness, blackouts, seizures. No recent symptoms of stroke or mini- stroke. No recent episodes of slurred speech, or temporary blindness.  Cardiac: No recent episodes of chest pain/pressure, no shortness of breath at rest.  No shortness of breath with exertion.  Denies history of atrial fibrillation or irregular heartbeat  Vascular: No history of rest pain in feet.  No history of claudication.  No history of non-healing ulcer, positive history of DVT   Pulmonary: No home oxygen, no productive cough, no hemoptysis,  No asthma or wheezing  Musculoskeletal:  [x ] Arthritis, [ ]  Low back pain,  [ ]  Joint pain  Hematologic:No history of hypercoagulable state.  No history of easy bleeding.  No history of anemia  Gastrointestinal: No hematochezia or melena,  No gastroesophageal reflux, no trouble swallowing  Urinary: [x ] chronic Kidney disease, [ ]  on HD - [ ]  MWF or [ ]  TTHS, [ ]  Burning with urination, [ ]  Frequent urination, [ ]  Difficulty urinating;   Skin: No rashes  Psychological: No history of anxiety,  positive history of depression   Physical Examination  Vitals:   10/26/22 0823 10/26/22 0827  BP: (!) 166/74 (!) 164/78  Pulse: 68   Resp: (!) 93   Temp:  97.6 F (36.4 C)   Weight: 160 lb 14.4 oz (73 kg)   Height: 5\' 2"  (1.575 m)     Body mass index is 29.43 kg/m.  General:  Alert and oriented, no acute distress HEENT: Normal Neck: No bruit or JVD Pulmonary: Clear to auscultation bilaterally Cardiac: Regular Rate and Rhythm without murmur Gastrointestinal: Soft, non-tender, non-distended, no mass, no scars Skin: No rash Extremity Pulses:  2+ radial,  femoral, dorsalis pedis, pulses bilaterally Musculoskeletal: No deformity or edema  Neurologic: Upper and lower extremity motor 5/5 and symmetric  DATA:  Right Carotid  Findings:  +----------+--------+--------+--------+------------------+-----------------  ----+           PSV cm/sEDV cm/sStenosisPlaque DescriptionComments                +----------+--------+--------+--------+------------------+-----------------  ----+  CCA Prox  68      16                                                        +----------+--------+--------+--------+------------------+-----------------  ----+  CCA Mid   63      19                                                        +----------+--------+--------+--------+------------------+-----------------  ----+  CCA Distal59      16              heterogenous                              +----------+--------+--------+--------+------------------+-----------------  ----+  ICA Prox  136     31      1-39%   heterogenous                              +----------+--------+--------+--------+------------------+-----------------  ----+  ICA Mid   119     33                                                        +----------+--------+--------+--------+------------------+-----------------  ----+  ICA Distal126     35                                                        +----------+--------+--------+--------+------------------+-----------------  ----+  ECA      37                      heterogenous      Retrograde flow  into  the ICA                 +----------+--------+--------+--------+------------------+-----------------  ----+   +----------+--------+-------+----------------+-------------------+           PSV cm/sEDV cmsDescribe        Arm Pressure (mmHG)  +----------+--------+-------+----------------+-------------------+  [id number]           Multiphasic, [id number]                  +----------+--------+-------+----------------+-------------------+   +---------+--------+--+--------+--+---------+   VertebralPSV cm/s72EDV cm/s14Antegrade  +---------+--------+--+--------+--+---------+      Left Carotid Findings:  +----------+--------+--------+--------+------------------+--------+           PSV cm/sEDV cm/sStenosisPlaque DescriptionComments  +----------+--------+--------+--------+------------------+--------+  CCA Prox  90      24                                          +----------+--------+--------+--------+------------------+--------+  CCA Mid   89      29              homogeneous                 +----------+--------+--------+--------+------------------+--------+  CCA Distal94      25              homogeneous                 +----------+--------+--------+--------+------------------+--------+  ICA Prox  69      17      1-39%   homogeneous                 +----------+--------+--------+--------+------------------+--------+  ICA Mid   105     31                                          +----------+--------+--------+--------+------------------+--------+  ICA Distal112     31                                          +----------+--------+--------+--------+------------------+--------+  ECA      689     228     >50%    homogeneous                 +----------+--------+--------+--------+------------------+--------+   +----------+--------+--------+--------+-------------------+           PSV cm/sEDV cm/sDescribeArm Pressure (mmHG)  +----------+--------+--------+--------+-------------------+  Subclavian228                    130                  +----------+--------+--------+--------+-------------------+   +---------+--------+--+--------+--+  VertebralPSV cm/s76EDV cm/s22  +---------+--------+--+--------+--+    Summary:  Right Carotid: Velocities in the right ICA are consistent with a 1-39%  stenosis.   Left Carotid: Velocities in the left ICA are consistent with a 1-39%  stenosis.   Vertebrals:  Bilateral vertebral arteries demonstrate antegrade flow.  Subclavians: Normal flow hemodynamics were seen in bilateral subclavian               arteries.   ASSESSMENT/PLAN: [name] is a 82 y.o. female with critical, asymptomatic left carotid artery stenosis.  S/P left CEA on 03/11/23 by Dr. Lenell Antu.  She  remains asymptomatic for stroke/TIA symptoms.    Carotid duplex demonstrates < 39% stenosis B ICA and no recurrent or intimal hyperplasia. She will f/u in 1 year for repeat carotid duplex.  She will call with concerns.   She is managed on Coumadin and Praluent.  Stay active daily.       Mosetta Pigeon PA-C [facility] Office: [phone]  MD in clinic [place]

## 2022-10-26 ENCOUNTER — Ambulatory Visit (HOSPITAL_COMMUNITY)
Admission: RE | Admit: 2022-10-26 | Discharge: 2022-10-26 | Disposition: A | Discharge status: Home / Self Care | Payer: PPO | Source: Ambulatory Visit | Attending: Cardiovascular Disease | Admitting: Cardiovascular Disease

## 2022-10-26 ENCOUNTER — Ambulatory Visit (INDEPENDENT_AMBULATORY_CARE_PROVIDER_SITE_OTHER): Payer: PPO | Admitting: Physician Assistant

## 2022-10-26 VITALS — BP 164/78 | HR 68 | Temp 97.6°F | Resp 93 | Ht 62.0 in | Wt 160.9 lb

## 2022-10-26 DIAGNOSIS — I6522 Occlusion and stenosis of left carotid artery: Secondary | ICD-10-CM | POA: Insufficient documentation

## 2022-10-27 ENCOUNTER — Other Ambulatory Visit (HOSPITAL_BASED_OUTPATIENT_CLINIC_OR_DEPARTMENT_OTHER): Payer: Self-pay | Admitting: Cardiovascular Disease

## 2022-10-27 ENCOUNTER — Other Ambulatory Visit: Payer: Self-pay | Admitting: Cardiology

## 2022-10-27 DIAGNOSIS — I825Z9 Chronic embolism and thrombosis of unspecified deep veins of unspecified distal lower extremity: Secondary | ICD-10-CM

## 2022-10-27 DIAGNOSIS — Z5181 Encounter for therapeutic drug level monitoring: Secondary | ICD-10-CM

## 2022-10-27 NOTE — Telephone Encounter (Signed)
Warfarin  refill DVT Last INR 10/05/22 Last OV 06/07/22

## 2022-10-27 NOTE — Telephone Encounter (Signed)
Rx(s) sent to pharmacy electronically.  

## 2022-10-28 ENCOUNTER — Other Ambulatory Visit (HOSPITAL_BASED_OUTPATIENT_CLINIC_OR_DEPARTMENT_OTHER): Payer: Self-pay | Admitting: *Deleted

## 2022-10-28 DIAGNOSIS — I6523 Occlusion and stenosis of bilateral carotid arteries: Secondary | ICD-10-CM

## 2022-10-29 ENCOUNTER — Telehealth (HOSPITAL_BASED_OUTPATIENT_CLINIC_OR_DEPARTMENT_OTHER): Payer: Self-pay

## 2022-10-29 DIAGNOSIS — I6523 Occlusion and stenosis of bilateral carotid arteries: Secondary | ICD-10-CM

## 2022-10-29 NOTE — Telephone Encounter (Addendum)
Results called to patient who verbalizes understanding! Repeat testing ordered.    ----- Message from Alver Sorrow, NP sent at 10/28/2022  5:19 PM EDT ----- Carotid duplex with bilateral 1 to 39% stenosis.  Continue Praluent to prevent progression.  Repeat carotid duplex in 1 year for monitoring.

## 2022-11-11 ENCOUNTER — Encounter (INDEPENDENT_AMBULATORY_CARE_PROVIDER_SITE_OTHER): Payer: Self-pay | Admitting: Ophthalmology

## 2022-11-11 ENCOUNTER — Ambulatory Visit (INDEPENDENT_AMBULATORY_CARE_PROVIDER_SITE_OTHER): Payer: PPO | Admitting: Ophthalmology

## 2022-11-11 DIAGNOSIS — H353122 Nonexudative age-related macular degeneration, left eye, intermediate dry stage: Secondary | ICD-10-CM

## 2022-11-11 DIAGNOSIS — H401130 Primary open-angle glaucoma, bilateral, stage unspecified: Secondary | ICD-10-CM

## 2022-11-11 DIAGNOSIS — Z961 Presence of intraocular lens: Secondary | ICD-10-CM | POA: Diagnosis not present

## 2022-11-11 DIAGNOSIS — H353211 Exudative age-related macular degeneration, right eye, with active choroidal neovascularization: Secondary | ICD-10-CM

## 2022-11-11 DIAGNOSIS — I10 Essential (primary) hypertension: Secondary | ICD-10-CM

## 2022-11-11 DIAGNOSIS — H35033 Hypertensive retinopathy, bilateral: Secondary | ICD-10-CM | POA: Diagnosis not present

## 2022-11-11 DIAGNOSIS — Z7984 Long term (current) use of oral hypoglycemic drugs: Secondary | ICD-10-CM

## 2022-11-11 DIAGNOSIS — E119 Type 2 diabetes mellitus without complications: Secondary | ICD-10-CM

## 2022-11-11 MED ORDER — BEVACIZUMAB CHEMO INJECTION 1.25MG/0.05ML SYRINGE FOR KALEIDOSCOPE
1.2500 mg | INTRAVITREAL | Status: AC | PRN
  Administered 2022-11-11: 1.25 mg via INTRAVITREAL

## 2022-11-11 NOTE — Progress Notes (Addendum)
[facility] - Clinic Note  11/11/2022     CHIEF COMPLAINT Patient presents for Retina Follow Up  HISTORY OF PRESENT ILLNESS: [name] is a 82 y.o. female who presents to the clinic today for:  HPI     Retina Follow Up   Patient presents with  Wet AMD.  In both eyes.  This started 4 years ago.  Duration of 4 weeks.  Since onset it is stable.  I, the attending physician,  performed the HPI with the patient and updated documentation appropriately.        Comments   4 week retina follow up ARMD and IVA OD pt is reporting no vision changes noticed she has floaters at times but denies flashes of light       Last edited by Rennis Chris, MD on 11/11/2022 11:48 AM.    Patient states she fell on Sunday and she is still sore from it, she has been having pains in her right temple since then, but she did not hit her head  Referring physician: [name], MD [address] [place],  [place] [address]  HISTORICAL INFORMATION:  Selected notes from the MEDICAL RECORD NUMBER Referred by Dr. Daisy Lazar for concern of SRF OD LEE: 11.27.20 (M. Cotter) [BCVA: OD: 20/80-- OS: 20/60-]  Ocular Hx-glaucoma (latanoprost)  PMH-DM    CURRENT MEDICATIONS: Current Outpatient Medications (Ophthalmic Drugs)  Medication Sig   dorzolamide-timolol (COSOPT) 22.3-6.8 MG/ML ophthalmic solution INSTILL 1 DROP INTO RIGHT EYE TWICE A DAY   latanoprost (XALATAN) 0.005 % ophthalmic solution Place 1 drop into both eyes at bedtime.   Polyethyl Glycol-Propyl Glycol (SYSTANE OP) Place 1 drop into the left eye daily as needed (dry eye).   prednisoLONE acetate (PRED FORTE) 1 % ophthalmic suspension Place 1 drop into the right eye 2 (two) times daily. Taper   No current facility-administered medications for this visit. (Ophthalmic Drugs)   Current Outpatient Medications (Other)  Medication Sig   albuterol (PROVENTIL HFA;VENTOLIN HFA) 108 (90 BASE) MCG/ACT inhaler Inhale 2 puffs into the  lungs every 4 (four) hours as needed for shortness of breath.   amitriptyline (ELAVIL) 25 MG tablet Take 25 mg by mouth at bedtime.   anastrozole (ARIMIDEX) 1 MG tablet TAKE 1 TABLET BY MOUTH EVERY DAY (Patient taking differently: Take 1 mg by mouth at bedtime.)   carvedilol (COREG) 6.25 MG tablet Take 1 tablet (6.25 mg total) by mouth 2 (two) times daily.   cetirizine (ZYRTEC) 10 MG tablet Take 10 mg by mouth daily.   Cholecalciferol (VITAMIN D-3) 1000 units CAPS Take 1,000 Units by mouth daily.   diazepam (VALIUM) 2 MG tablet Take 2 mg by mouth 2 (two) times daily as needed for anxiety (dizziness).   doxycycline (VIBRAMYCIN) 100 MG capsule Take 1 capsule (100 mg total) by mouth 2 (two) times daily.   esomeprazole (NEXIUM) 20 MG capsule Take 20 mg by mouth daily at 12 noon.   FARXIGA 5 MG TABS tablet Take 5 mg by mouth every morning.   fluticasone (FLOVENT HFA) 110 MCG/ACT inhaler Inhale 1 puff into the lungs 2 (two) times daily. Rinse mouth with water after each use   furosemide (LASIX) 40 MG tablet TAKE 1 TABLET (40 MG TOTAL) BY MOUTH DAILY. MAY TAKE EXTRA DAILY AS NEEDED FOR SWELLING   gabapentin (NEURONTIN) 300 MG capsule Take 300 mg by mouth 3 (three) times daily.   glimepiride (AMARYL) 2 MG tablet Take 2 mg by mouth daily.  hydrALAZINE (APRESOLINE) 25 MG tablet TAKE ONE TABLET BY MOUTH AT BREAKFAST AND AT BEDTIME   isosorbide mononitrate (IMDUR) 30 MG 24 hr tablet TAKE ONE TABLET BY MOUTH ONCE DAILY   meclizine (ANTIVERT) 25 MG tablet Take 25 mg by mouth 2 (two) times daily as needed for dizziness.   Multiple Vitamins-Minerals (PRESERVISION AREDS 2 PO) Take 1 capsule by mouth in the morning and at bedtime.   Omega-3 Fatty Acids (FISH OIL) 1200 MG CAPS Take 1,200 mg by mouth 2 (two) times daily.   polyethylene glycol (MIRALAX / GLYCOLAX) packet Take 17 g by mouth daily.   potassium chloride SA (KLOR-CON M) 20 MEQ tablet TAKE ONE-HALF TABLET BY  MOUTH DAILY   PRALUENT 75 MG/ML SOAJ INJECT  75 MG into THE SKIN EVERY 14 DAYS   promethazine-dextromethorphan (PROMETHAZINE-DM) 6.25-15 MG/5ML syrup Take 5 mLs by mouth 4 (four) times daily as needed.   TRADJENTA 5 MG TABS tablet Take 5 mg by mouth daily.   vitamin B-12 (CYANOCOBALAMIN) 100 MCG tablet Take 100 mcg by mouth daily.   warfarin (COUMADIN) 2 MG tablet TAKE ONE TABLET BY MOUTH EVERYDAY AT BEDTIME AS DIRECTED by THE coumadin Clinic   No current facility-administered medications for this visit. (Other)   REVIEW OF SYSTEMS: ROS   Positive for: Genitourinary, Endocrine, Cardiovascular, Eyes Negative for: Constitutional, Gastrointestinal, Neurological, Skin, Musculoskeletal, HENT, Respiratory, Psychiatric, Allergic/Imm, Heme/Lymph Last edited by Etheleen Mayhew, COT on 11/11/2022  8:14 AM.     ALLERGIES Allergies  Allergen Reactions   Alphagan [Brimonidine] Itching   Diflunisal Swelling    Other reaction(s): ENTIRE BODY SWELLING   Vioxx [Rofecoxib] Shortness Of Breath   Metformin And Related     Kidney failure   Nexlizet [Bempedoic Acid-Ezetimibe]     Causes elevated Liver and Kidney function   Repatha [Evolocumab]     MYALGIAS   Codeine Rash   Elemental Sulfur Rash   Motrin [Ibuprofen] Rash   Penicillins Rash   Pravastatin Rash   PAST MEDICAL HISTORY Past Medical History:  Diagnosis Date   Antral gastritis    EGD 11/15   Asthmatic bronchitis    Back pain    Breast cancer (HCC)    right breast   CAD in native artery 03/10/2021   Chronic diastolic heart failure (HCC) 05/20/2015   Grade 2 diastolic dysfunction.  04/2015.   Chronic kidney disease    kidney function low   Diabetes mellitus    x 5 yrs   DVT of axillary vein, acute left (HCC) 07/24/2012   GERD (gastroesophageal reflux disease)    Glaucoma    POAG OU   Heart murmur    History of hiatal hernia    History of kidney stones    Hyperlipidemia 05/20/2015   Hypertension    Hypertensive retinopathy    OU   Hypothyroidism    Kidney  stones    Macular degeneration    Wet OD, Dry OS   Mixed hyperlipidemia    OSA (obstructive sleep apnea) 12/17/2021   Peripheral venous insufficiency    Pinched nerve    right elbow   Pneumonia    PONV (postoperative nausea and vomiting)    Sigmoid diverticulitis    Snoring 03/10/2021   Vertigo    chonic   Past Surgical History:  Procedure Laterality Date   ABDOMINAL HYSTERECTOMY     BACK SURGERY     spinal    BREAST LUMPECTOMY WITH RADIOACTIVE SEED LOCALIZATION Right 12/03/2020   Procedure: RIGHT  BREAST LUMPECTOMY WITH RADIOACTIVE SEED LOCALIZATION;  Surgeon: Manus Rudd, MD;  Location: [facility] OR;  Service: General;  Laterality: Right;   CARDIAC CATHETERIZATION N/A 05/26/2015   Procedure: Left Heart Cath and Coronary Angiography;  Surgeon: Corky Crafts, MD;  Location[facility] INVASIVE CV LAB;  Service: Cardiovascular;  Laterality: N/A;   CATARACT EXTRACTION Bilateral    CHOLECYSTECTOMY     COLON SURGERY     COLONOSCOPY N/A 12/27/2013   Procedure: COLONOSCOPY;  Surgeon: Malissa Hippo, MD;  Location: [facility] ENDO SUITE;  Service: Endoscopy;  Laterality: N/A;  200   COLOSTOMY CLOSURE     ENDARTERECTOMY Left 03/10/2022   Procedure: LEFT CAROTID ENDARTERECTOMY;  Surgeon: Leonie Douglas, MD;  Location: [facility] OR;  Service: Vascular;  Laterality: Left;   ESOPHAGOGASTRODUODENOSCOPY N/A 05/07/2014   Procedure: ESOPHAGOGASTRODUODENOSCOPY (EGD);  Surgeon: Malissa Hippo, MD;  Location: [facility] END[facility] SUITE;  Service: Endoscopy;  Laterality: N/A;   EYE SURGERY Bilateral    Cat Sx   fracture left foot     HERNIA REPAIR     NM MYOCAR PERF WALL MOTION  01/28/2009   Normal   OTHER SURGICAL HISTORY     colostomy, colostomy reversal, for diverticulitis surgical hernia repair, arm surgery, neck surgery   PATCH ANGIOPLASTY Left 03/10/2022   Procedure: PATCH ANGIOPLASTY WITH 1X6CM Kathleen Lime;  Surgeon: Leonie Douglas, MD;  Location: [facility] OR;  Service: Vascular;  Laterality: Left;   US ECHOCARDIOGRAPHY   02/11/2010   Mild MR,trace TR & AI   FAMILY HISTORY Family History  Problem Relation Age of Onset   CVA Maternal Grandmother 67       deceased   Heart disease Maternal Grandmother    Stroke Maternal Grandmother    Breast cancer Maternal Grandmother    Other Mother 68       Cause unknown   Cancer Mother        liver   Heart attack Brother 62       deceased   Breast cancer Sister    Leukemia Maternal Aunt    Glaucoma Maternal Uncle    Bone cancer Maternal Uncle    Spina bifida Daughter    SOCIAL HISTORY Social History   Tobacco Use   Smoking status: Former    Types: Cigarettes    Quit date: 12/10/2013    Years since quitting: 8.9    Passive exposure: Never   Smokeless tobacco: Never   Tobacco comments:    Smoke 1-1 1/2 packs a day  Vaping Use   Vaping Use: Never used  Substance Use Topics   Alcohol use: No   Drug use: No       OPHTHALMIC EXAM: Base Eye Exam     Visual Acuity (Snellen - Linear)       Right Left   Dist [place] 20/30 -3 20/30   Dist ph  NI NI         Tonometry (Tonopen, 8:25 AM)       Right Left   Pressure 12 12         Pupils       Pupils Dark Light Shape React APD   Right PERRL 2 1 Round Brisk None   Left PERRL 2 1 Round Brisk None         Visual Fields       Left Right    Full Full         Extraocular Movement       Right  Left    Full, Ortho Full, Ortho         Neuro/Psych     Oriented x3: Yes   Mood/Affect: Normal         Dilation     Both eyes: 2.5% Phenylephrine @ 8:25 AM           Slit Lamp and Fundus Exam     Slit Lamp Exam       Right Left   Lids/Lashes Dermatochalasis - upper lid, mild Meibomian gland dysfunction Dermatochalasis - upper lid, Telangiectasia, mild Meibomian gland dysfunction   Conjunctiva/Sclera White and quiet, superior bleb White and quiet   Cornea 1+Punctate epethelial erosions, well healed cataract wound 1+ fine Punctate epithelial erosions, arcus, mild EBMD   Anterior  Chamber narrow temporal angle, tube at 1200 Deep and quiet, narrow temporal angle   Iris round and mod dilated Round and moderately dilated to 5.52mm   Lens Posterior chamber intraocular lens, open PC Posterior chamber intraocular lens, trace Posterior capsular opacification   Anterior Vitreous Vitreous syneresis, Posterior vitreous detachment Vitreous syneresis         Fundus Exam       Right Left   Disc Sharp rim, +Pallor, +cupping w/ superior and inferior rim thinning, temporal Peripapillary atrophy mild Pallor, Sharp rim, temporal Peripapillary atrophy   C/D Ratio 0.9 0.5   Macula Flat, Blunted foveal reflex, +focal CNV/PED nasal macula with partial pigment ring, trace IRF/ SRF, Drusen, RPE mottling and clumping, no heme Flat, Blunted foveal reflex, fine drusen, mild ERM, Retinal pigment epithelial mottling and clumping, No heme or edema   Vessels attenuated, Tortuous attenuated, Tortuous   Periphery Attached, mild reticular degeneration, No heme, No RT/RD Attached; no heme           IMAGING AND PROCEDURES  Imaging and Procedures for @TODAY @  OCT, Retina - OU - Both Eyes       Right Eye Quality was good. Central Foveal Thickness: 194. Progression has been stable. Findings include normal foveal contour, retinal drusen , subretinal hyper-reflective material, intraretinal hyper-reflective material, intraretinal fluid, pigment epithelial detachment, subretinal fluid, outer retinal atrophy (Persistent focal IRF/SRF overlying nasal PED / CNV, partial PVD).   Left Eye Quality was good. Central Foveal Thickness: 203. Progression has been stable. Findings include normal foveal contour, no IRF, no SRF, retinal drusen (Partial PVD, patchy ORA).   Notes *Images captured and stored on drive  Diagnosis / Impression:  OD: exudative ARMD -- Persistent focal IRF/SRF overlying nasal PED / CNV; partial PVD OS: NFP, no IRF/SRF; +drusen -- nonexudative ARMD  Clinical management:  See  below  Abbreviations: NFP - Normal foveal profile. CME - cystoid macular edema. PED - pigment epithelial detachment. IRF - intraretinal fluid. SRF - subretinal fluid. EZ - ellipsoid zone. ERM - epiretinal membrane. ORA - outer retinal atrophy. ORT - outer retinal tubulation. SRHM - subretinal hyper-reflective material      Intravitreal Injection, Pharmacologic Agent - OD - Right Eye       Time Out 11/11/2022. 8:13 AM. Confirmed correct patient, procedure, site, and patient consented.   Anesthesia Topical anesthesia was used. Anesthetic medications included Lidocaine 2%, Proparacaine 0.5%.   Procedure Preparation included 5% betadine to ocular surface, eyelid speculum. A supplied (32g) needle was used.   Injection: 1.25 mg Bevacizumab 1.25mg /0.23ml   Route: Intravitreal, Site: Right Eye   NDC: [id number], Lot: [id number], Expiration date: 12/27/2022   Post-op Post injection exam found visual acuity of at least counting fingers. The  patient tolerated the procedure well. There were no complications. The patient received written and verbal post procedure care education. Post injection medications were not given.            ASSESSMENT/PLAN:   ICD-10-CM   1. Exudative age-related macular degeneration of right eye with active choroidal neovascularization (HCC)  H35.3211 OCT, Retina - OU - Both Eyes    Intravitreal Injection, Pharmacologic Agent - OD - Right Eye    Bevacizumab (AVASTIN) SOLN 1.25 mg    2. Intermediate stage nonexudative age-related macular degeneration of left eye  H35.3122     3. Diabetes mellitus type 2 without retinopathy (HCC)  E11.9     4. Long term (current) use of oral hypoglycemic drugs  Z79.84     5. Essential hypertension  I10     6. Hypertensive retinopathy of both eyes  H35.033     7. Pseudophakia of both eyes  Z96.1     8. Primary open angle glaucoma of both eyes, unspecified glaucoma stage  H40.1130      1. Exudative age related macular  degeneration, OD  - h/o delayed f/u from 8 wks to 12 on 6.20.22 due to lumpectomy -- dx'd w/ DCIS - h/o delayed follow up from 4 weeks to 8 weeks due to passing of husband (12.11.20-02.12.21)  - s/p IVA OD #1 (12.11.20), #2 (02.12.21), #3 (03.12.21), #4 (04.09.21), #5 (05.14.21), #6 (06.17.21), #7 (07.23.21), #8 (10.15.21), #9 (12.7.21), #10 (03.22.22), #11 (06.20.22), #12 (08.24.22), #13 (10.31.22), #14 (01.03.23), #15 (08.15.23), #16 (11.15.23), #17 (02.29.24), #18 (03.29.24)  - FA 12.11.20 confirms +CNVM  **history of increased fluid at 7+ months noted on 08.15.23** - OCT today shows Persistent focal IRF/SRF overlying nasal PED / CNV --stably improved at 6 weeks  - BCVA OD stable at 20/30  - recommend IVA OD #19 today, 05.09.24 with follow up at 6 weeks again  - pt wishes to proceed with injection  - RBA of procedure discussed, questions answered - informed consent obtained and signed - see procedure note  - Avastin informed consent form re-signed and scanned on 08.15.2023 (OD)             - Continue to use AT OD  - f/u 6 weeks -- DFE/OCT/possible injection  2. Age related macular degeneration, non-exudative, OS  - intermediate stage  - BCVA 20/30 - The incidence, anatomy, and pathology of dry AMD, risk of progression, and the AREDS and AREDS 2 study including smoking risks discussed with patient.   - recommend Amsler grid monitoring  3,4. Diabetes mellitus, type 2 without retinopathy - The incidence, risk factors for progression, natural history and treatment options for diabetic retinopathy  were discussed with patient.   - The need for close monitoring of blood glucose, blood pressure, and serum lipids, avoiding cigarette or any type of tobacco, and the need for long term follow up was also discussed with patient.  - monitor   5,6. Hypertensive retinopathy OU  - discussed importance of tight BP control  - monitor   7. Pseudophakia OU, PCO OD  - s/p CE/IOL OU (Dr. Harlon Flor)  -  IOL in good position  - s/p yag cap OD (04.06.22) -- good PC opening  - monitor   8. POAG OU  - formerly managed by Dr. Harlon Flor -- now following at [facility]  - s/p laser w/ Dr. Harlon Flor -- ?SLT  - s/p trab OD w/ Dr. Zetta Bills  - IOP 12 OU today  - currently  on Latanoprost QHS  Ophthalmic Meds Ordered this visit:  Meds ordered this encounter  Medications   Bevacizumab (AVASTIN) SOLN 1.25 mg     Return in about 6 weeks (around 12/23/2022) for f/u exu ARMD OD, DFE, OCT.  There are no Patient Instructions on file for this visit.  This document serves as a record of services personally performed by Karie Chimera, MD, PhD. It was created on their behalf by Glee Arvin. Manson Passey, OA an ophthalmic technician. The creation of this record is the provider's dictation and/or activities during the visit.    Electronically signed by: Glee Arvin. Manson Passey, [place] 05.09.2024 11:53 AM  Karie Chimera, M.D., Ph.D. Diseases & Surgery of the Retina and Vitreous [facility]  I have reviewed the above documentation for accuracy and completeness, and I agree with the above. Karie Chimera, M.D., Ph.D. 11/11/22 11:53 AM   Abbreviations: M myopia (nearsighted); A astigmatism; H hyperopia (farsighted); P presbyopia; Mrx spectacle prescription;  CTL contact lenses; OD right eye; OS left eye; OU both eyes  XT exotropia; ET esotropia; PEK punctate epithelial keratitis; PEE punctate epithelial erosions; DES dry eye syndrome; MGD meibomian gland dysfunction; ATs artificial tears; PFAT's preservative free artificial tears; NSC nuclear sclerotic cataract; PSC posterior subcapsular cataract; ERM epi-retinal membrane; PVD posterior vitreous detachment; RD retinal detachment; DM diabetes mellitus; DR diabetic retinopathy; NPDR non-proliferative diabetic retinopathy; PDR proliferative diabetic retinopathy; CSME clinically significant macular edema; DME diabetic macular edema; dbh dot blot hemorrhages;  CWS cotton wool spot; POAG primary open angle glaucoma; C/D cup-to-disc ratio; HVF humphrey visual field; GVF goldmann visual field; OCT optical coherence tomography; IOP intraocular pressure; BRVO Branch retinal vein occlusion; CRVO central retinal vein occlusion; CRAO central retinal artery occlusion; BRAO branch retinal artery occlusion; RT retinal tear; SB scleral buckle; PPV pars plana vitrectomy; VH Vitreous hemorrhage; PRP panretinal laser photocoagulation; IVK intravitreal kenalog; VMT vitreomacular traction; MH Macular hole;  NVD neovascularization of the disc; NVE neovascularization elsewhere; AREDS age related eye disease study; ARMD age related macular degeneration; POAG primary open angle glaucoma; EBMD epithelial/anterior basement membrane dystrophy; ACIOL anterior chamber intraocular lens; IOL intraocular lens; PCIOL posterior chamber intraocular lens; Phaco/IOL phacoemulsification with intraocular lens placement; PRK photorefractive keratectomy; LASIK laser assisted in situ keratomileusis; HTN hypertension; DM diabetes mellitus; COPD chronic obstructive pulmonary disease

## 2022-11-12 ENCOUNTER — Encounter (INDEPENDENT_AMBULATORY_CARE_PROVIDER_SITE_OTHER): Payer: PPO | Admitting: Ophthalmology

## 2022-11-12 DIAGNOSIS — H353122 Nonexudative age-related macular degeneration, left eye, intermediate dry stage: Secondary | ICD-10-CM

## 2022-11-12 DIAGNOSIS — E119 Type 2 diabetes mellitus without complications: Secondary | ICD-10-CM

## 2022-11-12 DIAGNOSIS — H353211 Exudative age-related macular degeneration, right eye, with active choroidal neovascularization: Secondary | ICD-10-CM

## 2022-11-12 DIAGNOSIS — H26491 Other secondary cataract, right eye: Secondary | ICD-10-CM

## 2022-11-12 DIAGNOSIS — Z961 Presence of intraocular lens: Secondary | ICD-10-CM

## 2022-11-12 DIAGNOSIS — I10 Essential (primary) hypertension: Secondary | ICD-10-CM

## 2022-11-12 DIAGNOSIS — H35033 Hypertensive retinopathy, bilateral: Secondary | ICD-10-CM

## 2022-11-12 DIAGNOSIS — H401130 Primary open-angle glaucoma, bilateral, stage unspecified: Secondary | ICD-10-CM

## 2022-11-15 ENCOUNTER — Encounter (HOSPITAL_COMMUNITY): Payer: Self-pay

## 2022-11-15 ENCOUNTER — Emergency Department (HOSPITAL_COMMUNITY)
Admission: EM | Admit: 2022-11-15 | Discharge: 2022-11-15 | Disposition: A | Discharge status: Home / Self Care | Payer: PPO | Attending: Emergency Medicine | Admitting: Emergency Medicine

## 2022-11-15 ENCOUNTER — Emergency Department (HOSPITAL_COMMUNITY): Payer: PPO

## 2022-11-15 ENCOUNTER — Other Ambulatory Visit: Payer: Self-pay

## 2022-11-15 DIAGNOSIS — Q6211 Congenital occlusion of ureteropelvic junction: Secondary | ICD-10-CM

## 2022-11-15 DIAGNOSIS — N132 Hydronephrosis with renal and ureteral calculous obstruction: Secondary | ICD-10-CM | POA: Insufficient documentation

## 2022-11-15 DIAGNOSIS — I509 Heart failure, unspecified: Secondary | ICD-10-CM | POA: Insufficient documentation

## 2022-11-15 DIAGNOSIS — E1122 Type 2 diabetes mellitus with diabetic chronic kidney disease: Secondary | ICD-10-CM | POA: Diagnosis not present

## 2022-11-15 DIAGNOSIS — Z853 Personal history of malignant neoplasm of breast: Secondary | ICD-10-CM | POA: Diagnosis not present

## 2022-11-15 DIAGNOSIS — I130 Hypertensive heart and chronic kidney disease with heart failure and stage 1 through stage 4 chronic kidney disease, or unspecified chronic kidney disease: Secondary | ICD-10-CM | POA: Diagnosis not present

## 2022-11-15 DIAGNOSIS — Z7901 Long term (current) use of anticoagulants: Secondary | ICD-10-CM | POA: Insufficient documentation

## 2022-11-15 DIAGNOSIS — R109 Unspecified abdominal pain: Secondary | ICD-10-CM | POA: Diagnosis present

## 2022-11-15 DIAGNOSIS — N200 Calculus of kidney: Secondary | ICD-10-CM

## 2022-11-15 DIAGNOSIS — I2510 Atherosclerotic heart disease of native coronary artery without angina pectoris: Secondary | ICD-10-CM | POA: Insufficient documentation

## 2022-11-15 DIAGNOSIS — N189 Chronic kidney disease, unspecified: Secondary | ICD-10-CM | POA: Insufficient documentation

## 2022-11-15 DIAGNOSIS — Z7984 Long term (current) use of oral hypoglycemic drugs: Secondary | ICD-10-CM | POA: Diagnosis not present

## 2022-11-15 DIAGNOSIS — Z7951 Long term (current) use of inhaled steroids: Secondary | ICD-10-CM | POA: Insufficient documentation

## 2022-11-15 DIAGNOSIS — J45909 Unspecified asthma, uncomplicated: Secondary | ICD-10-CM | POA: Diagnosis not present

## 2022-11-15 DIAGNOSIS — E039 Hypothyroidism, unspecified: Secondary | ICD-10-CM | POA: Diagnosis not present

## 2022-11-15 DIAGNOSIS — Z79899 Other long term (current) drug therapy: Secondary | ICD-10-CM | POA: Insufficient documentation

## 2022-11-15 DIAGNOSIS — N23 Unspecified renal colic: Secondary | ICD-10-CM | POA: Diagnosis not present

## 2022-11-15 LAB — CBC WITH DIFFERENTIAL/PLATELET
Abs Immature Granulocytes: 0.01 10*3/uL (ref 0.00–0.07)
Basophils Absolute: 0 10*3/uL (ref 0.0–0.1)
Basophils Relative: 1 %
Eosinophils Absolute: 0.1 10*3/uL (ref 0.0–0.5)
Eosinophils Relative: 2 %
HCT: 44.4 % (ref 36.0–46.0)
Hemoglobin: 14.5 g/dL (ref 12.0–15.0)
Immature Granulocytes: 0 %
Lymphocytes Relative: 37 %
Lymphs Abs: 2 10*3/uL (ref 0.7–4.0)
MCH: 30 pg (ref 26.0–34.0)
MCHC: 32.7 g/dL (ref 30.0–36.0)
MCV: 91.9 fL (ref 80.0–100.0)
Monocytes Absolute: 0.4 10*3/uL (ref 0.1–1.0)
Monocytes Relative: 8 %
Neutro Abs: 2.9 10*3/uL (ref 1.7–7.7)
Neutrophils Relative %: 52 %
Platelets: 198 10*3/uL (ref 150–400)
RBC: 4.83 MIL/uL (ref 3.87–5.11)
RDW: 12.9 % (ref 11.5–15.5)
WBC: 5.4 10*3/uL (ref 4.0–10.5)
nRBC: 0 % (ref 0.0–0.2)

## 2022-11-15 LAB — PROTIME-INR
INR: 2 — ABNORMAL HIGH (ref 0.8–1.2)
Prothrombin Time: 22.8 seconds — ABNORMAL HIGH (ref 11.4–15.2)

## 2022-11-15 LAB — URINALYSIS, ROUTINE W REFLEX MICROSCOPIC
Bacteria, UA: NONE SEEN
Bilirubin Urine: NEGATIVE
Glucose, UA: >=500 mg/dL — AB
Hgb urine dipstick: NEGATIVE
Ketones, ur: NEGATIVE mg/dL
Nitrite: NEGATIVE
Protein, ur: NEGATIVE mg/dL
Specific Gravity, Urine: 1.002 — ABNORMAL LOW (ref 1.005–1.030)
pH: 7 (ref 5.0–8.0)

## 2022-11-15 LAB — COMPREHENSIVE METABOLIC PANEL
ALT: 19 U/L (ref 0–44)
AST: 18 U/L (ref 15–41)
Albumin: 3.8 g/dL (ref 3.5–5.0)
Alkaline Phosphatase: 87 U/L (ref 38–126)
Anion gap: 9 (ref 5–15)
BUN: 15 mg/dL (ref 8–23)
CO2: 27 mmol/L (ref 22–32)
Calcium: 9.6 mg/dL (ref 8.9–10.3)
Chloride: 105 mmol/L (ref 98–111)
Creatinine, Ser: 0.96 mg/dL (ref 0.44–1.00)
GFR, Estimated: 59 mL/min — ABNORMAL LOW (ref >60)
Glucose, Bld: 82 mg/dL (ref 70–99)
Potassium: 3.8 mmol/L (ref 3.5–5.1)
Sodium: 141 mmol/L (ref 135–145)
Total Bilirubin: 0.5 mg/dL (ref 0.3–1.2)
Total Protein: 6.9 g/dL (ref 6.5–8.1)

## 2022-11-15 LAB — LIPASE, BLOOD: Lipase: 63 U/L — ABNORMAL HIGH (ref 11–51)

## 2022-11-15 MED ORDER — IOHEXOL 300 MG/ML  SOLN
100.0000 mL | Freq: Once | INTRAMUSCULAR | Status: AC | PRN
  Administered 2022-11-15: 100 mL via INTRAVENOUS

## 2022-11-15 MED ORDER — ONDANSETRON 4 MG PO TBDP: 4.0000 mg | ORAL_TABLET | Freq: Three times a day (TID) | ORAL | 0 refills | Status: DC | PRN

## 2022-11-15 MED ORDER — OXYCODONE-ACETAMINOPHEN 5-325 MG PO TABS: 1.0000 | ORAL_TABLET | Freq: Four times a day (QID) | ORAL | 0 refills | Status: DC | PRN

## 2022-11-15 MED ORDER — TAMSULOSIN HCL 0.4 MG PO CAPS: 0.4000 mg | ORAL_CAPSULE | Freq: Every day | ORAL | 0 refills | Status: DC

## 2022-11-15 NOTE — ED Notes (Signed)
Pt back from CT

## 2022-11-15 NOTE — ED Provider Notes (Signed)
[place] EMERGENCY DEPARTMENT AT [facility] Provider Note   CSN: [id number] Arrival date & time: 11/15/22  1040     History  Chief Complaint  Patient presents with   Abdominal Pain    [name] is a 82 y.o. female.  Pt is a 82 yo female with pmhx significant for htn, dm, hypothyroidism, DVT, back pain, hld, gerd, asthma, ckd, kidney stones, chf, glaucoma, macular degeneration, breast cancer, CAD, and sleep apnea.  Pt is on coumadin and hit her right hip.  She has had some soreness in her hip, but she's been able to ambulate.  She noticed some abd pain for the past few days.  Today, she had a "knot" in her abdomen next to her umbilicus.  She said she rubbed it and it went away.  Pt is concerned she did something during her fall.         Home Medications Prior to Admission medications   Medication Sig Start Date End Date Taking? Authorizing Provider  albuterol (PROVENTIL HFA;VENTOLIN HFA) 108 (90 BASE) MCG/ACT inhaler Inhale 2 puffs into the lungs every 4 (four) hours as needed for shortness of breath. 06/04/15  Yes Chilton Si, MD  amitriptyline (ELAVIL) 25 MG tablet Take 25 mg by mouth at bedtime.   Yes [provider]  anastrozole (ARIMIDEX) 1 MG tablet TAKE 1 TABLET BY MOUTH EVERY DAY Patient taking differently: Take 1 mg by mouth at bedtime. 12/07/21  Yes [name], MD  carvedilol (COREG) 6.25 MG tablet Take 1 tablet (6.25 mg total) by mouth 2 (two) times daily. 03/02/22  Yes Chilton Si, MD  cetirizine (ZYRTEC) 10 MG tablet Take 10 mg by mouth daily.   Yes [provider]  Cholecalciferol (VITAMIN D-3) 1000 units CAPS Take 1,000 Units by mouth daily.   Yes [provider]  diazepam (VALIUM) 2 MG tablet Take 2 mg by mouth 2 (two) times daily as needed for anxiety (dizziness). 04/10/20  Yes [provider]  dorzolamide-timolol (COSOPT) 22.3-6.8 MG/ML ophthalmic solution INSTILL 1 DROP INTO RIGHT EYE TWICE A  DAY Patient taking differently: Place 1 drop into the right eye 2 (two) times daily. 08/28/20  Yes Rennis Chris, MD  esomeprazole (NEXIUM) 20 MG capsule Take 20 mg by mouth daily at 12 noon.   Yes [provider]  FARXIGA 5 MG TABS tablet Take 5 mg by mouth every morning. 10/01/20  Yes [provider]  fluticasone (FLOVENT HFA) 110 MCG/ACT inhaler Inhale 1 puff into the lungs 2 (two) times daily. Rinse mouth with water after each use 06/06/22  Yes Particia Nearing, PA-C  furosemide (LASIX) 40 MG tablet TAKE 1 TABLET (40 MG TOTAL) BY MOUTH DAILY. MAY TAKE EXTRA DAILY AS NEEDED FOR SWELLING Patient taking differently: Take 40 mg by mouth daily. 04/15/21  Yes Chilton Si, MD  gabapentin (NEURONTIN) 300 MG capsule Take 300 mg by mouth 3 (three) times daily. 05/08/19  Yes [provider]  glimepiride (AMARYL) 2 MG tablet Take 2 mg by mouth daily. 08/26/17  Yes [provider]  hydrALAZINE (APRESOLINE) 25 MG tablet TAKE ONE TABLET BY MOUTH AT BREAKFAST AND AT BEDTIME Patient taking differently: Take 25 mg by mouth 2 (two) times daily. 10/27/22  Yes Chilton Si, MD  isosorbide mononitrate (IMDUR) 30 MG 24 hr tablet TAKE ONE TABLET BY MOUTH ONCE DAILY 02/08/22  Yes Chilton Si, MD  latanoprost (XALATAN) 0.005 % ophthalmic solution Place 1 drop into both eyes at bedtime.  Yes [provider]  meclizine (ANTIVERT) 25 MG tablet Take 25 mg by mouth 2 (two) times daily as needed for dizziness.   Yes [provider]  Multiple Vitamins-Minerals (PRESERVISION AREDS 2 PO) Take 1 capsule by mouth in the morning and at bedtime.   Yes [provider]  Omega-3 Fatty Acids (FISH OIL) 1200 MG CAPS Take 1,200 mg by mouth 2 (two) times daily.   Yes [provider]  ondansetron (ZOFRAN-ODT) 4 MG disintegrating tablet Take 1 tablet (4 mg total) by mouth every 8 (eight) hours as needed. 11/15/22  Yes Jacalyn Lefevre, MD   oxyCODONE-acetaminophen (PERCOCET/ROXICET) 5-325 MG tablet Take 1 tablet by mouth every 6 (six) hours as needed for severe pain. 11/15/22  Yes Jacalyn Lefevre, MD  Polyethyl Glycol-Propyl Glycol (SYSTANE OP) Place 1 drop into the left eye daily as needed (dry eye).   Yes [provider]  potassium chloride SA (KLOR-CON M) 20 MEQ tablet TAKE ONE-HALF TABLET BY  MOUTH DAILY Patient taking differently: Take 10 mEq by mouth See admin instructions. Only takes when taking 08/11/21  Yes Chilton Si, MD  PRALUENT 75 MG/ML SOAJ INJECT 75 MG into THE SKIN EVERY 14 DAYS Patient taking differently: Inject 75 mg into the skin every 14 (fourteen) days. 05/31/22  Yes Chilton Si, MD  prednisoLONE acetate (PRED FORTE) 1 % ophthalmic suspension Place 1 drop into the right eye 2 (two) times daily. Taper 01/22/22  Yes [provider]  promethazine-dextromethorphan (PROMETHAZINE-DM) 6.25-15 MG/5ML syrup Take 5 mLs by mouth 4 (four) times daily as needed. Patient taking differently: Take 5 mLs by mouth 4 (four) times daily as needed for cough. 06/06/22  Yes Particia Nearing, PA-C  tamsulosin (FLOMAX) 0.4 MG CAPS capsule Take 1 capsule (0.4 mg total) by mouth daily. 11/15/22  Yes Jacalyn Lefevre, MD  TRADJENTA 5 MG TABS tablet Take 5 mg by mouth daily. 10/01/20  Yes [provider]  vitamin B-12 (CYANOCOBALAMIN) 100 MCG tablet Take 100 mcg by mouth daily. 03/04/14  Yes [provider]  warfarin (COUMADIN) 2 MG tablet TAKE ONE TABLET BY MOUTH EVERYDAY AT BEDTIME AS DIRECTED by THE coumadin Clinic Patient taking differently: Take 2 mg by mouth daily at 4 PM. 10/27/22  Yes Chilton Si, MD  polyethylene glycol [facility] / Ethelene Hal) packet Take 17 g by mouth daily as needed for moderate constipation.    [provider]      Allergies    Alphagan [brimonidine], Diflunisal, Vioxx [rofecoxib], Metformin and related, Nexlizet [bempedoic acid-ezetimibe], Repatha  [evolocumab], Codeine, Elemental sulfur, Motrin [ibuprofen], Penicillins, and Pravastatin    Review of Systems   Review of Systems  Gastrointestinal:  Positive for abdominal pain.  All other systems reviewed and are negative.   Physical Exam Updated Vital Signs BP 132/64   Pulse (!) 59   Temp 98.4 F (36.9 C) (Oral)   Resp 16   Ht 5\' 2"  (1.575 m)   Wt 73 kg   SpO2 92%   BMI 29.43 kg/m  Physical Exam Vitals and nursing note reviewed.  Constitutional:      Appearance: She is well-developed. She is obese.  HENT:     Head: Normocephalic and atraumatic.     Mouth/Throat:     Mouth: Mucous membranes are moist.     Pharynx: Oropharynx is clear.  Eyes:     Extraocular Movements: Extraocular movements intact.     Pupils: Pupils are equal, round, and reactive to light.  Cardiovascular:  Rate and Rhythm: Normal rate and regular rhythm.     Heart sounds: Normal heart sounds.  Abdominal:     General: Abdomen is flat. Bowel sounds are normal.     Palpations: Abdomen is soft.     Comments: Pt has multiple surgical scars.  She points to a surgical scar around her umbilicus where she felt the knot.  No knot now.  I suspect she had a small hernia which she self reduced.  Skin:    General: Skin is warm.     Capillary Refill: Capillary refill takes less than 2 seconds.  Neurological:     General: No focal deficit present.     Mental Status: She is alert and oriented to person, place, and time.  Psychiatric:        Mood and Affect: Mood normal.        Behavior: Behavior normal.     ED Results / Procedures / Treatments   Labs (all labs ordered are listed, but only abnormal results are displayed) Labs Reviewed  COMPREHENSIVE METABOLIC PANEL - Abnormal; Notable for the following components:      Result Value   GFR, Estimated 59 (*)    All other components within normal limits  LIPASE, BLOOD - Abnormal; Notable for the following components:   Lipase 63 (*)    All other  components within normal limits  URINALYSIS, ROUTINE W REFLEX MICROSCOPIC - Abnormal; Notable for the following components:   Color, Urine COLORLESS (*)    Specific Gravity, Urine 1.002 (*)    Glucose, UA >=500 (*)    Leukocytes,Ua MODERATE (*)    All other components within normal limits  PROTIME-INR - Abnormal; Notable for the following components:   Prothrombin Time 22.8 (*)    INR 2.0 (*)    All other components within normal limits  CBC WITH DIFFERENTIAL/PLATELET    EKG None  Radiology CT ABDOMEN PELVIS W CONTRAST  Result Date: 11/15/2022 CLINICAL DATA:  Abdominal pain especially with ambulation. EXAM: CT ABDOMEN AND PELVIS WITH CONTRAST TECHNIQUE: Multidetector CT imaging of the abdomen and pelvis was performed using the standard protocol following bolus administration of intravenous contrast. RADIATION DOSE REDUCTION: This exam was performed according to the departmental dose-optimization program which includes automated exposure control, adjustment of the mA and/or kV according to patient size and/or use of iterative reconstruction technique. CONTRAST:  OMNIPAQUE IOHEXOL 300 MG/ML  SOLN COMPARISON:  05/05/2014 FINDINGS: Lower chest: Descending thoracic aortic atherosclerotic vascular calcification. Hepatobiliary: Cholecystectomy. No biliary dilatation. No focal liver lesion identified. Pancreas: Unremarkable Spleen: Technically nonspecific 4 mm hypodense lesion the dome of the spleen on image 66 series 5 is highly likely to be a benign lesion although technically nonspecific. This measured 3 mm in diameter on 08/19/2012. No further imaging workup of this lesion is indicated. Adrenals/Urinary Tract: Both adrenal glands appear normal. Moderate to prominent right hydronephrosis associated with a 1.1 by 0.6 by 0.8 cm right UPJ calculus. There is substantial wall thickening and mild enhancement of the UPJ and proximal ureter on the right side, tracking down up to 3.3 cm below the stone.  Just cephalad to this dominant stone there a smaller 2 mm calculus shown on image 50 of series 5. Other right renal calculi include a 5 mm in long axis mid kidney calculus and 8 mm in long axis right kidney lower pole calculus, both which are nonobstructive. Also a 2 mm right kidney lower pole nonobstructive renal calculus. No left-sided urinary tract stones  are identified. Urinary bladder unremarkable. Stomach/Bowel: None Prominent stool throughout the colon favors constipation. Postoperative findings in the sigmoid colon. Vascular/Lymphatic: Atherosclerosis is present, including aortoiliac atherosclerotic disease. Nonocclusive atheromatous plaque proximally in the celiac trunk and SMA. The inferior mesenteric artery is thought to be patent. Reproductive: Uterus absent.  Adnexa unremarkable. Other: No supplemental non-categorized findings. Musculoskeletal: Likely dystrophic calcifications in the anterior abdominal wall subcutaneous adipose tissues to the right and left mid load in the supraumbilical region. Probable supraumbilical hernia containing adipose tissue for example on image 24 series 2 Atrophic rectus abdominus 1.0 cm in transverse dimension sclerotic lesion in the left sacral ala on image 55 series 2, previously 0.8 cm on 05/05/2014, probably a benign lesion. Suspected right proximal femoral enchondroma, slightly less sclerosis than on the 2015 exam. Spurring of both femoral heads and acetabula. Lower lumbar spondylosis and degenerative disc disease with suspected impingement at L3-4, L4-5, and L5-S1 as shown on lumbar MRI of 01/28/2019. IMPRESSION: 1. Moderate to prominent right hydronephrosis associated with a 1.1 by 0.6 by 0.8 cm right UPJ calculus. There is substantial wall thickening and mild enhancement of the right UPJ and proximal ureter tracking down up to 3.3 cm below the stone. This is most likely caused by inflammation related to impaction of the stone, with tumor the proximal ureter a less  likely differential diagnostic consideration. 2. Additional nonobstructive right renal calculi are present. 3. Prominent stool throughout the colon favors constipation. 4. Lower lumbar spondylosis and degenerative disc disease with suspected impingement at L3-4, L4-5, and L5-S1 as shown on lumbar MRI of 01/28/2019. 5. Probable supraumbilical hernia containing adipose tissue. 6. Aortic atherosclerosis. Aortic Atherosclerosis (ICD10-I70.0). Electronically Signed   By: Gaylyn Rong M.D.   On: 11/15/2022 14:32    Procedures Procedures    Medications Ordered in ED Medications  iohexol (OMNIPAQUE) 300 MG/ML solution 100 mL (100 mLs Intravenous Contrast Given 11/15/22 1357)    ED Course/ Medical Decision Making/ A&P                             Medical Decision Making Amount and/or Complexity of Data Reviewed Labs: ordered. Radiology: ordered.  Risk Prescription drug management.   This patient presents to the ED for concern of abd pain, this involves an extensive number of treatment options, and is a complaint that carries with it a high risk of complications and morbidity.  The differential diagnosis includes bleed, hernia, infection   Co morbidities that complicate the patient evaluation  htn, dm, hypothyroidism, DVT, back pain, hld, gerd, asthma, ckd, kidney stones, chf, glaucoma, macular degeneration, breast cancer, CAD, and sleep apnea   Additional history obtained:  Additional history obtained from epic chart review  Lab Tests:  I Ordered, and personally interpreted labs.  The pertinent results include:  cbc nl, cmp nl, ua nl, lip sl elevated at 63, ua neg   Imaging Studies ordered:  I ordered imaging studies including ct abd/pelvis  I independently visualized and interpreted imaging which showed  Moderate to prominent right hydronephrosis associated with a 1.1  by 0.6 by 0.8 cm right UPJ calculus. There is substantial wall  thickening and mild enhancement of the  right UPJ and proximal ureter  tracking down up to 3.3 cm below the stone. This is most likely  caused by inflammation related to impaction of the stone, with tumor  the proximal ureter a less likely differential diagnostic  consideration.  2. Additional nonobstructive right  renal calculi are present.  3. Prominent stool throughout the colon favors constipation.  4. Lower lumbar spondylosis and degenerative disc disease with  suspected impingement at L3-4, L4-5, and L5-S1 as shown on lumbar  MRI of 01/28/2019.  5. Probable supraumbilical hernia containing adipose tissue.  6. Aortic atherosclerosis.    Aortic Atherosclerosis (ICD10-I70.0).   I agree with the radiologist interpretation   Cardiac Monitoring:  The patient was maintained on a cardiac monitor.  I personally viewed and interpreted the cardiac monitored which showed an underlying rhythm of: nsr   Medicines ordered and prescription drug management:  Pt did not want anything for pain while here.  She does want something for home in case pain comes back. I have reviewed the patients home medicines and have made adjustments as needed   Test Considered:  ct   Critical Interventions:  ct   Problem List / ED Course:  Abd pain:  due to right hydronephrosis and impacted stone.  Pain is under control.  Pt is stable for d/c.  She knows to f/u with urology.  Return if worse.    Reevaluation:  After the interventions noted above, I reevaluated the patient and found that they have :improved   Social Determinants of Health:  Lives at home   Dispostion:  After consideration of the diagnostic results and the patients response to treatment, I feel that the patent would benefit from discharge with outpatient f/u.          Final Clinical Impression(s) / ED Diagnoses Final diagnoses:  Kidney stone  Hydronephrosis with ureteropelvic junction (UPJ) obstruction    Rx / DC Orders ED Discharge Orders           Ordered    oxyCODONE-acetaminophen (PERCOCET/ROXICET) 5-325 MG tablet  Every 6 hours PRN        11/15/22 1549    ondansetron (ZOFRAN-ODT) 4 MG disintegrating tablet  Every 8 hours PRN        11/15/22 1551    tamsulosin (FLOMAX) 0.4 MG CAPS capsule  Daily        11/15/22 1551              Jacalyn Lefevre, MD 11/15/22 1552

## 2022-11-15 NOTE — ED Triage Notes (Signed)
Pt comes in w/ complaints of abdominal pain when ambulating. Pt states she fell over a week ago and takes warfarin but did not notice any issues so did not come to be checked out. Pt states that abdominal pain has progressively gotten work and noticed a small finger tip size knot in the RUQ next to her umbilicus this morning. Pt denies SOB, CP, but states hx of blood clots.

## 2022-11-16 ENCOUNTER — Ambulatory Visit: Payer: PPO | Attending: Cardiology | Admitting: *Deleted

## 2022-11-16 DIAGNOSIS — Z5181 Encounter for therapeutic drug level monitoring: Secondary | ICD-10-CM | POA: Diagnosis not present

## 2022-11-16 DIAGNOSIS — I82A12 Acute embolism and thrombosis of left axillary vein: Secondary | ICD-10-CM | POA: Diagnosis not present

## 2022-11-16 LAB — POCT INR: INR: 2.3 (ref 2.0–3.0)

## 2022-11-16 NOTE — Patient Instructions (Signed)
Continue warfarin 1 tablet daily   Recheck INR in 6 wk.  Call Coumadin clinic for any questions or changes in medications.  Pending Urology appt for kidney stone.  Not scheduled yet.

## 2022-11-17 ENCOUNTER — Encounter: Payer: PPO | Admitting: Urology

## 2022-11-18 ENCOUNTER — Telehealth: Payer: Self-pay | Admitting: *Deleted

## 2022-11-18 ENCOUNTER — Telehealth (HOSPITAL_BASED_OUTPATIENT_CLINIC_OR_DEPARTMENT_OTHER): Payer: Self-pay | Admitting: Cardiovascular Disease

## 2022-11-18 ENCOUNTER — Other Ambulatory Visit: Payer: Self-pay | Admitting: Urology

## 2022-11-18 DIAGNOSIS — N201 Calculus of ureter: Secondary | ICD-10-CM | POA: Diagnosis not present

## 2022-11-18 NOTE — Telephone Encounter (Signed)
Pt has been added to pre op schedule for tomorrow ok per pre op APP Eula Fried. NP. Med rec and consent are done.

## 2022-11-18 NOTE — Telephone Encounter (Signed)
Primary Cardiologist:Tiffany Duke Salvia, MD   Preoperative team, please contact this patient and set up a phone call appointment for further preoperative risk assessment. Please obtain consent and complete medication review. Thank you for your help.   I confirm that guidance regarding antiplatelet and oral anticoagulation therapy has been completed and, if necessary, noted below.   Levi Aland, NP-C  11/18/2022, 4:25 PM [address], [address] Office [phone] Fax [phone]

## 2022-11-18 NOTE — Telephone Encounter (Signed)
Patient with diagnosis of multiple DVT on warfarin for anticoagulation.    Procedure: right ureteroscopy  Date of procedure: 11/24/22   CrCl 42 ml/min Platelet count 198  Per office protocol, patient can hold warfarin for 5 days prior to procedure.   Patient WILL need bridging with Lovenox (enoxaparin) around procedure.  **This guidance is not considered finalized until pre-operative APP has relayed final recommendations.**  Will send to Vashti Hey in coumadin clinic to arrange. Pt just seen 2 days ago.

## 2022-11-18 NOTE — Telephone Encounter (Addendum)
Lovenox Bridge instructions:  5/16: Last dose of warfarin.  5/17: No warfarin or enoxaparin (Lovenox).  5/18: Inject enoxaparin 80mg  in the fatty abdominal tissue at least 2 inches from the belly button daily at 8pm rotate sites. No warfarin.  5/19: Inject enoxaparin in the fatty tissue at 8pm. No warfarin.  5/20: Inject enoxaparin in the fatty tissue at 8pm. No warfarin.  5/21: No Lovenox, No warfarin.  5/22: Procedure Day - No enoxaparin - Resume warfarin in the evening or as directed by doctor (take an extra half tablet with usual dose for 2 days then resume normal dose).  5/23: Resume enoxaparin inject in the fatty tissue every 24 hours and take warfarin  5/24: Inject enoxaparin in the fatty tissue every 24 hours and take warfarin  5/24: Inject enoxaparin in the fatty tissue every 24 hours and take warfarin  5/26: Inject enoxaparin in the fatty tissue every 24 hours and take warfarin  5/27: Inject enoxaparin in the fatty tissue every 24 hours and take warfarin  5/28: warfarin appt to check INR.

## 2022-11-18 NOTE — Telephone Encounter (Signed)
Pt has been added to pre op schedule for tomorrow ok per pre op APP Eula Fried. NP. Med rec and consent are done.     Patient Consent for Virtual Visit        [name] has provided verbal consent on 11/18/2022 for a virtual visit (video or telephone).   CONSENT FOR VIRTUAL VISIT FOR:  [name]  By participating in this virtual visit I agree to the following:  I hereby voluntarily request, consent and authorize [facility] and its employed or contracted physicians, physician assistants, nurse practitioners or other licensed health care professionals (the Practitioner), to provide me with telemedicine health care services (the "Services") as deemed necessary by the treating Practitioner. I acknowledge and consent to receive the Services by the Practitioner via telemedicine. I understand that the telemedicine visit will involve communicating with the Practitioner through live audiovisual communication technology and the disclosure of certain medical information by electronic transmission. I acknowledge that I have been given the opportunity to request an in-person assessment or other available alternative prior to the telemedicine visit and am voluntarily participating in the telemedicine visit.  I understand that I have the right to withhold or withdraw my consent to the use of telemedicine in the course of my care at any time, without affecting my right to future care or treatment, and that the Practitioner or I may terminate the telemedicine visit at any time. I understand that I have the right to inspect all information obtained and/or recorded in the course of the telemedicine visit and may receive copies of available information for a reasonable fee.  I understand that some of the potential risks of receiving the Services via telemedicine include:  Delay or interruption in medical evaluation due to technological equipment failure or disruption; Information transmitted may  not be sufficient (e.g. poor resolution of images) to allow for appropriate medical decision making by the Practitioner; and/or  In rare instances, security protocols could fail, causing a breach of personal health information.  Furthermore, I acknowledge that it is my responsibility to provide information about my medical history, conditions and care that is complete and accurate to the best of my ability. I acknowledge that Practitioner's advice, recommendations, and/or decision may be based on factors not within their control, such as incomplete or inaccurate data provided by me or distortions of diagnostic images or specimens that may result from electronic transmissions. I understand that the practice of medicine is not an exact science and that Practitioner makes no warranties or guarantees regarding treatment outcomes. I acknowledge that a copy of this consent can be made available to me via my patient portal [facility] MyChart), or I can request a printed copy by calling the office of [facility].    I understand that my insurance will be billed for this visit.   I have read or had this consent read to me. I understand the contents of this consent, which adequately explains the benefits and risks of the Services being provided via telemedicine.  I have been provided ample opportunity to ask questions regarding this consent and the Services and have had my questions answered to my satisfaction. I give my informed consent for the services to be provided through the use of telemedicine in my medical care

## 2022-11-18 NOTE — Telephone Encounter (Signed)
   Pre-operative Risk Assessment    Patient Name: [name]  DOB: 05/13/1941 MRN: [id number]      Request for Surgical Clearance    Procedure:  right ureteroscopy   Date of Surgery:  Clearance 11/24/22                                 Surgeon:  Dr. Alvester Morin Surgeon's Group or Practice Name:  [facility]  Phone number:  [phone] Fax number:  [phone]   Type of Clearance Requested:   - Pharmacy:  Hold Warfarin (Coumadin) leaving up to cardiology   Type of Anesthesia:  General    Additional requests/questions:    Signed, Damaris Schooner   11/18/2022, 11:18 AM

## 2022-11-19 ENCOUNTER — Ambulatory Visit: Payer: PPO | Attending: Internal Medicine | Admitting: Nurse Practitioner

## 2022-11-19 ENCOUNTER — Encounter: Payer: Self-pay | Admitting: Nurse Practitioner

## 2022-11-19 DIAGNOSIS — Z01810 Encounter for preprocedural cardiovascular examination: Secondary | ICD-10-CM

## 2022-11-19 MED ORDER — ENOXAPARIN SODIUM 120 MG/0.8ML IJ SOSY: 120.0000 mg | PREFILLED_SYRINGE | INTRAMUSCULAR | 0 refills | Status: DC

## 2022-11-19 NOTE — Telephone Encounter (Signed)
Patient called and bridge instructions reviewed with patient.

## 2022-11-19 NOTE — Progress Notes (Signed)
Virtual Visit via Telephone Note   Because of [name] co-morbid illnesses, she is at least at moderate risk for complications without adequate follow up.  This format is felt to be most appropriate for this patient at this time.  The patient did not have access to video technology/had technical difficulties with video requiring transitioning to audio format only (telephone).  All issues noted in this document were discussed and addressed.  No physical exam could be performed with this format.  Please refer to the patient's chart for her consent to telehealth for [facility].  Evaluation Performed:  Preoperative cardiovascular risk assessment _____________   Date:  11/19/2022   Patient ID:  [name], DOB [birth year], MRN [id number] Patient Location:  Home Provider location:   Office  Primary Care Provider:  [name], MD Primary Cardiologist:  Chilton Si, MD  Chief Complaint / Patient Profile   82 y.o. y/o female with a h/o CAD (50% stenosis LAD) seen on cardiac cath 05/2015, chronic HFpEF, HTN, HLD, prior DVT on warfarin, breast cancer s/p lumpectomy and XRT, type 2 diabetes, lower extremity edema who is pending cystoscopy/right ureteroscopy and presents today for telephonic preoperative cardiovascular risk assessment.  History of Present Illness    [name] is a 81 y.o. female who presents via audio/video conferencing for a telehealth visit today.  Pt was last seen in cardiology clinic on 06/07/22 by Dr. Duke Salvia.  At that time [name] was doing well.  The patient is now pending procedure as outlined above. Since her last visit, she  denies chest pain, shortness of breath, lower extremity edema, fatigue, palpitations, melena, hematuria, hemoptysis, diaphoresis, weakness, presyncope, syncope, orthopnea, and PND. She remains active around her home and walking for exercise to achieve > 4 METS activity without any concerning cardiac symptoms.    Past Medical History    Past Medical History:  Diagnosis Date   Antral gastritis    EGD 11/15   Asthmatic bronchitis    Back pain    Breast cancer (HCC)    right breast   CAD in native artery 03/10/2021   Chronic diastolic heart failure (HCC) 05/20/2015   Grade 2 diastolic dysfunction.  04/2015.   Chronic kidney disease    kidney function low   Diabetes mellitus    x 5 yrs   DVT of axillary vein, acute left (HCC) 07/24/2012   GERD (gastroesophageal reflux disease)    Glaucoma    POAG OU   Heart murmur    History of hiatal hernia    History of kidney stones    Hyperlipidemia 05/20/2015   Hypertension    Hypertensive retinopathy    OU   Hypothyroidism    Kidney stones    Macular degeneration    Wet OD, Dry OS   Mixed hyperlipidemia    OSA (obstructive sleep apnea) 12/17/2021   Peripheral venous insufficiency    Pinched nerve    right elbow   Pneumonia    PONV (postoperative nausea and vomiting)    Sigmoid diverticulitis    Snoring 03/10/2021   Vertigo    chonic   Past Surgical History:  Procedure Laterality Date   ABDOMINAL HYSTERECTOMY     BACK SURGERY     spinal    BREAST LUMPECTOMY WITH RADIOACTIVE SEED LOCALIZATION Right 12/03/2020   Procedure: RIGHT BREAST LUMPECTOMY WITH RADIOACTIVE SEED LOCALIZATION;  Surgeon: Manus Rudd, MD;  Location: [facility] OR;  Service: General;  Laterality: Right;  CARDIAC CATHETERIZATION N/A 05/26/2015   Procedure: Left Heart Cath and Coronary Angiography;  Surgeon: Corky Crafts, MD;  Location: [facility] INVASIVE CV LAB;  Service: Cardiovascular;  Laterality: N/A;   CATARACT EXTRACTION Bilateral    CHOLECYSTECTOMY     COLON SURGERY     COLONOSCOPY N/A 12/27/2013   Procedure: COLONOSCOPY;  Surgeon: Malissa Hippo, MD;  Location: [facility] ENDO SUITE;  Service: Endoscopy;  Laterality: N/A;  200   COLOSTOMY CLOSURE     ENDARTERECTOMY Left 03/10/2022   Procedure: LEFT CAROTID ENDARTERECTOMY;  Surgeon: Leonie Douglas, MD;  Location[facility]  OR;  Service: Vascular;  Laterality: Left;   ESOPHAGOGASTRODUODENOSCOPY N/A 05/07/2014   Procedure: ESOPHAGOGASTRODUODENOSCOPY (EGD);  Surgeon: Malissa Hippo, MD;  Location: [facility] ENDO SUITE;  Service: Endoscopy;  Laterality: N/A;   EYE SURGERY Bilateral    Cat Sx   fracture left foot     HERNIA REPAIR     NM MYOCAR PERF WALL MOTION  01/28/2009   Normal   OTHER SURGICAL HISTORY     colostomy, colostomy reversal, for diverticulitis surgical hernia repair, arm surgery, neck surgery   PATCH ANGIOPLASTY Left 03/10/2022   Procedure: PATCH ANGIOPLASTY WITH 1X6CM Kathleen Lime;  Surgeon: Leonie Douglas, MD;  Location: [facility] OR;  Service: Vascular;  Laterality: Left;   US ECHOCARDIOGRAPHY  02/11/2010   Mild MR,trace TR & AI    Allergies  Allergies  Allergen Reactions   Alphagan [Brimonidine] Itching   Diflunisal Swelling    Other reaction(s): ENTIRE BODY SWELLING   Vioxx [Rofecoxib] Shortness Of Breath   Metformin And Related     Kidney failure   Nexlizet [Bempedoic Acid-Ezetimibe]     Causes elevated Liver and Kidney function   Repatha [Evolocumab]     MYALGIAS   Codeine Rash   Elemental Sulfur Rash   Motrin [Ibuprofen] Rash   Penicillins Rash   Pravastatin Rash    Home Medications    Prior to Admission medications   Medication Sig Start Date End Date Taking? Authorizing Provider  albuterol (PROVENTIL HFA;VENTOLIN HFA) 108 (90 BASE) MCG/ACT inhaler Inhale 2 puffs into the lungs every 4 (four) hours as needed for shortness of breath. 06/04/15   Chilton Si, MD  amitriptyline (ELAVIL) 25 MG tablet Take 25 mg by mouth at bedtime.    [provider]  anastrozole (ARIMIDEX) 1 MG tablet TAKE 1 TABLET BY MOUTH EVERY DAY Patient taking differently: Take 1 mg by mouth at bedtime. 12/07/21   [name], MD  carvedilol (COREG) 6.25 MG tablet Take 1 tablet (6.25 mg total) by mouth 2 (two) times daily. 03/02/22   Chilton Si, MD  cetirizine (ZYRTEC) 10 MG tablet Take 10 mg  by mouth daily.    [provider]  Cholecalciferol (VITAMIN D-3) 1000 units CAPS Take 1,000 Units by mouth daily.    [provider]  diazepam (VALIUM) 2 MG tablet Take 2 mg by mouth 2 (two) times daily as needed for anxiety (dizziness). 04/10/20   [provider]  dorzolamide-timolol (COSOPT) 22.3-6.8 MG/ML ophthalmic solution INSTILL 1 DROP INTO RIGHT EYE TWICE A DAY Patient taking differently: Place 1 drop into the right eye 2 (two) times daily. 08/28/20   Rennis Chris, MD  esomeprazole (NEXIUM) 20 MG capsule Take 20 mg by mouth daily at 12 noon.    [provider]  FARXIGA 5 MG TABS tablet Take 5 mg by mouth every morning. 10/01/20   [provider]  fluticasone (FLOVENT HFA) 110 MCG/ACT  inhaler Inhale 1 puff into the lungs 2 (two) times daily. Rinse mouth with water after each use 06/06/22   Particia Nearing, PA-C  furosemide (LASIX) 40 MG tablet TAKE 1 TABLET (40 MG TOTAL) BY MOUTH DAILY. MAY TAKE EXTRA DAILY AS NEEDED FOR SWELLING Patient taking differently: Take 40 mg by mouth daily. 04/15/21   Chilton Si, MD  gabapentin (NEURONTIN) 300 MG capsule Take 300 mg by mouth 3 (three) times daily. 05/08/19   [provider]  glimepiride (AMARYL) 2 MG tablet Take 2 mg by mouth daily. 08/26/17   [provider]  hydrALAZINE (APRESOLINE) 25 MG tablet TAKE ONE TABLET BY MOUTH AT BREAKFAST AND AT BEDTIME Patient taking differently: Take 25 mg by mouth 2 (two) times daily. 10/27/22   Chilton Si, MD  isosorbide mononitrate (IMDUR) 30 MG 24 hr tablet TAKE ONE TABLET BY MOUTH ONCE DAILY 02/08/22   Chilton Si, MD  latanoprost (XALATAN) 0.005 % ophthalmic solution Place 1 drop into both eyes at bedtime.    [provider]  meclizine (ANTIVERT) 25 MG tablet Take 25 mg by mouth 2 (two) times daily as needed for dizziness.    [provider]  Multiple Vitamins-Minerals (PRESERVISION AREDS 2 PO) Take 1 capsule by  mouth in the morning and at bedtime.    [provider]  Omega-3 Fatty Acids (FISH OIL) 1200 MG CAPS Take 1,200 mg by mouth 2 (two) times daily.    [provider]  ondansetron (ZOFRAN-ODT) 4 MG disintegrating tablet Take 1 tablet (4 mg total) by mouth every 8 (eight) hours as needed. 11/15/22   Jacalyn Lefevre, MD  oxyCODONE-acetaminophen (PERCOCET/ROXICET) 5-325 MG tablet Take 1 tablet by mouth every 6 (six) hours as needed for severe pain. 11/15/22   Jacalyn Lefevre, MD  Polyethyl Glycol-Propyl Glycol (SYSTANE OP) Place 1 drop into the left eye daily as needed (dry eye).    [provider]  polyethylene glycol (MIRALAX / GLYCOLAX) packet Take 17 g by mouth daily as needed for moderate constipation.    [provider]  potassium chloride SA (KLOR-CON M) 20 MEQ tablet TAKE ONE-HALF TABLET BY  MOUTH DAILY Patient taking differently: Take 10 mEq by mouth See admin instructions. Only takes when taking 08/11/21   Chilton Si, MD  PRALUENT 75 MG/ML SOAJ INJECT 75 MG into THE SKIN EVERY 14 DAYS Patient taking differently: Inject 75 mg into the skin every 14 (fourteen) days. 05/31/22   Chilton Si, MD  prednisoLONE acetate (PRED FORTE) 1 % ophthalmic suspension Place 1 drop into the right eye 2 (two) times daily. Taper 01/22/22   [provider]  promethazine-dextromethorphan (PROMETHAZINE-DM) 6.25-15 MG/5ML syrup Take 5 mLs by mouth 4 (four) times daily as needed. Patient taking differently: Take 5 mLs by mouth 4 (four) times daily as needed for cough. 06/06/22   Particia Nearing, PA-C  tamsulosin (FLOMAX) 0.4 MG CAPS capsule Take 1 capsule (0.4 mg total) by mouth daily. 11/15/22   Jacalyn Lefevre, MD  TRADJENTA 5 MG TABS tablet Take 5 mg by mouth daily. 10/01/20   [provider]  vitamin B-12 (CYANOCOBALAMIN) 100 MCG tablet Take 100 mcg by mouth daily. 03/04/14   [provider]  warfarin (COUMADIN) 2 MG tablet TAKE ONE TABLET BY  MOUTH EVERYDAY AT BEDTIME AS DIRECTED by THE coumadin Clinic Patient taking differently: Take 2 mg by mouth daily at 4 PM. 10/27/22   Chilton Si, MD    Physical Exam    Vital Signs:  [name] does not have vital signs available for review today.  Given telephonic nature of communication, physical exam is limited. AAOx3. NAD. Normal affect.  Speech and respirations are unlabored.  Accessory Clinical Findings    None  Assessment & Plan    1.  Preoperative Cardiovascular Risk Assessment:According to the Revised Cardiac Risk Index (RCRI), her Perioperative Risk of Major Cardiac Event is (%): 0.9. Her Functional Capacity in METs is: 5.62 according to the [facility] Activity Status Index (DASI). The patient is doing well from a cardiac perspective. Therefore, based on ACC/AHA guidelines, the patient would be at acceptable risk for the planned procedure without further cardiovascular testing.    The patient was advised that if she develops new symptoms prior to surgery to contact our office to arrange for a follow-up visit, and she verbalized understanding.  Per office protocol, patient can hold warfarin for 5 days prior to procedure. Patient WILL need bridging with Lovenox (enoxaparin) around procedure. She verbalized that she has received instructions and denies questions at this time.   A copy of this note will be routed to requesting surgeon.  Time:   Today, I have spent 10 minutes with the patient with telehealth technology discussing medical history, symptoms, and management plan.    Levi Aland, NP-C  11/19/2022, 3:27 PM [address] Office [phone] Fax [phone]

## 2022-11-21 ENCOUNTER — Other Ambulatory Visit: Payer: Self-pay | Admitting: Family Medicine

## 2022-11-21 ENCOUNTER — Other Ambulatory Visit: Payer: Self-pay | Admitting: Hematology and Oncology

## 2022-11-22 NOTE — Progress Notes (Signed)
Anesthesia Review:  PCP: Cardiologist : Chilton Si LOV 06/07/22  Eligha Bridegroom, NP preop clear- 11/19/22  Chest x-ray : EKG : 03/10/22  Echo : Stress test: 2021  Carotids- 10/26/22  Cardiac Cath :  2016  Activity level:  Sleep Study/ CPAP : Fasting Blood Sugar :      / Checks Blood Sugar -- times a day:   Blood Thinner/ Instructions /Last Dose: ASA / Instructions/ Last Dose :    Coumadin  LOvenox    11/15/22- in ED with kidney stone  Labs- cbc and CMP- 11/15/22

## 2022-11-22 NOTE — Patient Instructions (Signed)
SURGICAL WAITING ROOM VISITATION  Patients having surgery or a procedure may have no more than 2 support people in the waiting area - these visitors may rotate.    Children under the age of 70 must have an adult with them who is not the patient.  Due to an increase in RSV and influenza rates and associated hospitalizations, children ages 74 and under may not visit patients in [facility] hospitals.  If the patient needs to stay at the hospital during part of their recovery, the visitor guidelines for inpatient rooms apply. Pre-op nurse will coordinate an appropriate time for 1 support person to accompany patient in pre-op.  This support person may not rotate.    Please refer to the [facility] website for the visitor guidelines for Inpatients (after your surgery is over and you are in a regular room).       Your procedure is scheduled on:  11/24/22    Report to [facility] Main Entrance    Report to admitting at  0830 AM   Call this number if you have problems the morning of surgery [phone]   Do not eat food or dirnk liquids :After Midnight.                           If you have questions, please contact your surgeon's office.     Oral Hygiene is also important to reduce your risk of infection.                                    Remember - BRUSH YOUR TEETH THE MORNING OF SURGERY WITH YOUR REGULAR TOOTHPASTE  DENTURES WILL BE REMOVED PRIOR TO SURGERY PLEASE DO NOT APPLY "Poly grip" OR ADHESIVES!!!   Do NOT smoke after Midnight   Take these medicines the morning of surgery with A SIP OF WATER:  inahalers as usual and bring, nebulizer if needed, coreg, zyrtec, eye drops as usual nexium, imdur, hydralazine, flonase     Tradjenta-  Farxiga-  Amaryl-   DO NOT TAKE ANY ORAL DIABETIC MEDICATIONS DAY OF YOUR SURGERY  Bring CPAP mask and tubing day of surgery.                              You may not have any metal on your body including hair pins, jewelry, and  body piercing             Do not wear make-up, lotions, powders, perfumes/cologne, or deodorant  Do not wear nail polish including gel and S&S, artificial/acrylic nails, or any other type of covering on natural nails including finger and toenails. If you have artificial nails, gel coating, etc. that needs to be removed by a nail salon please have this removed prior to surgery or surgery may need to be canceled/ delayed if the surgeon/ anesthesia feels like they are unable to be safely monitored.   Do not shave  48 hours prior to surgery.               Men may shave face and neck.   Do not bring valuables to the hospital. [place] IS NOT             RESPONSIBLE   FOR VALUABLES.   Contacts, glasses, dentures or bridgework may not be worn into surgery.  Bring small overnight bag day of surgery.   DO NOT BRING YOUR HOME MEDICATIONS TO THE HOSPITAL. PHARMACY WILL DISPENSE MEDICATIONS LISTED ON YOUR MEDICATION LIST TO YOU DURING YOUR ADMISSION IN THE HOSPITAL!    Patients discharged on the day of surgery will not be allowed to drive home.  Someone NEEDS to stay with you for the first 24 hours after anesthesia.   Special Instructions: Bring a copy of your healthcare power of attorney and living will documents the day of surgery if you haven't scanned them before.              Please read over the following fact sheets you were given: IF YOU HAVE QUESTIONS ABOUT YOUR PRE-OP INSTRUCTIONS PLEASE CALL [phone]   If you received a COVID test during your pre-op visit  it is requested that you wear a mask when out in public, stay away from anyone that may not be feeling well and notify your surgeon if you develop symptoms. If you test positive for Covid or have been in contact with anyone that has tested positive in the last 10 days please notify you surgeon.    [place] - Preparing for Surgery Before surgery, you can play an important role.  Because skin is not sterile, your skin needs to  be as free of germs as possible.  You can reduce the number of germs on your skin by washing with CHG (chlorahexidine gluconate) soap before surgery.  CHG is an antiseptic cleaner which kills germs and bonds with the skin to continue killing germs even after washing. Please DO NOT use if you have an allergy to CHG or antibacterial soaps.  If your skin becomes reddened/irritated stop using the CHG and inform your nurse when you arrive at Short Stay. Do not shave (including legs and underarms) for at least 48 hours prior to the first CHG shower.  You may shave your face/neck. Please follow these instructions carefully:  1.  Shower with CHG Soap the night before surgery and the  morning of Surgery.  2.  If you choose to wash your hair, wash your hair first as usual with your  normal  shampoo.  3.  After you shampoo, rinse your hair and body thoroughly to remove the  shampoo.                           4.  Use CHG as you would any other liquid soap.  You can apply chg directly  to the skin and wash                       Gently with a scrungie or clean washcloth.  5.  Apply the CHG Soap to your body ONLY FROM THE NECK DOWN.   Do not use on face/ open                           Wound or open sores. Avoid contact with eyes, ears mouth and genitals (private parts).                       Wash face,  Genitals (private parts) with your normal soap.             6.  Wash thoroughly, paying special attention to the area where your surgery  will be performed.  7.  Thoroughly rinse your body  with warm water from the neck down.  8.  DO NOT shower/wash with your normal soap after using and rinsing off  the CHG Soap.                9.  Pat yourself dry with a clean towel.            10.  Wear clean pajamas.            11.  Place clean sheets on your bed the night of your first shower and do not  sleep with pets. Day of Surgery : Do not apply any lotions/deodorants the morning of surgery.  Please wear clean clothes to  the hospital/surgery center.  FAILURE TO FOLLOW THESE INSTRUCTIONS MAY RESULT IN THE CANCELLATION OF YOUR SURGERY PATIENT SIGNATURE_________________________________  NURSE SIGNATURE__________________________________  ________________________________________________________________________

## 2022-11-23 ENCOUNTER — Other Ambulatory Visit: Payer: Self-pay

## 2022-11-23 ENCOUNTER — Encounter (HOSPITAL_COMMUNITY): Payer: Self-pay

## 2022-11-23 ENCOUNTER — Encounter (HOSPITAL_COMMUNITY)
Admission: RE | Admit: 2022-11-23 | Discharge: 2022-11-23 | Disposition: A | Discharge status: Home / Self Care | Payer: PPO | Source: Ambulatory Visit | Attending: Family Medicine | Admitting: Family Medicine

## 2022-11-23 DIAGNOSIS — Z01818 Encounter for other preprocedural examination: Secondary | ICD-10-CM

## 2022-11-23 HISTORY — DX: Unspecified osteoarthritis, unspecified site: M19.90

## 2022-11-23 HISTORY — DX: Chronic obstructive pulmonary disease, unspecified: J44.9

## 2022-11-23 NOTE — Anesthesia Preprocedure Evaluation (Signed)
Anesthesia Evaluation  Patient identified by MRN, date of birth, ID band Patient awake    Reviewed: Allergy & Precautions, NPO status , Patient's Chart, lab work & pertinent test results, reviewed documented beta blocker date and time   History of Anesthesia Complications (+) PONV and history of anesthetic complications  Airway Mallampati: II  TM Distance: >3 FB Neck ROM: Full    Dental  (+) Dental Advisory Given, Upper Dentures   Pulmonary asthma , sleep apnea , COPD,  COPD inhaler, former smoker   Pulmonary exam normal breath sounds clear to auscultation       Cardiovascular hypertension, Pt. on home beta blockers + CAD  Normal cardiovascular exam Rhythm:Regular Rate:Normal     Neuro/Psych negative neurological ROS     GI/Hepatic Neg liver ROS, hiatal hernia,GERD  Medicated,,  Endo/Other  negative endocrine ROSdiabetes    Renal/GU RIGHT URETERAL STONE     Musculoskeletal  (+) Arthritis ,    Abdominal   Peds  Hematology negative hematology ROS (+)   Anesthesia Other Findings Day of surgery medications reviewed with the patient.  Reproductive/Obstetrics                              Anesthesia Physical Anesthesia Plan  ASA: 3  Anesthesia Plan: General   Post-op Pain Management: Tylenol PO (pre-op)*   Induction: Intravenous  PONV Risk Score and Plan: 4 or greater and Dexamethasone, Ondansetron and Treatment may vary due to age or medical condition  Airway Management Planned: LMA  Additional Equipment:   Intra-op Plan:   Post-operative Plan: Extubation in OR  Informed Consent: I have reviewed the patients History and Physical, chart, labs and discussed the procedure including the risks, benefits and alternatives for the proposed anesthesia with the patient or authorized representative who has indicated his/her understanding and acceptance.     Dental advisory given  Plan  Discussed with: CRNA  Anesthesia Plan Comments: (See PAT note 11/23/22)        Anesthesia Quick Evaluation

## 2022-11-23 NOTE — Progress Notes (Signed)
Anesthesia Chart Review   Case: [id number] Date/Time: 11/24/22 1015   Procedure: CYSTOSCOPY RIGHT URETEROSCOPY/HOLMIUM LASER/STENT PLACEMENT (Right) - 60 MINS FOR CASE   Anesthesia type: General   Pre-op diagnosis: RIGHT URETERAL STONE   Location: [facility] / [facility]   Surgeons: Crista Elliot, MD       DISCUSSION: 82 year old former smoker with history of OSA, HTN, DM, CKD, chronic HFpEF, CAD, DVT on warfarin, s/p left CEA by Dr. Lenell Antu 03/10/2022, breast cancer status postlumpectomy and XRT, COPD, right ureteral stone scheduled for above procedure 11/24/2022 with Dr. Modena Slater.  Per cardiology preoperative evaluation 11/19/2022, "Preoperative Cardiovascular Risk Assessment:According to the Revised Cardiac Risk Index (RCRI), her Perioperative Risk of Major Cardiac Event is (%): 0.9. Her Functional Capacity in METs is: 5.62 according to the Duke Activity Status Index (DASI). The patient is doing well from a cardiac perspective. Therefore, based on ACC/AHA guidelines, the patient would be at acceptable risk for the planned procedure without further cardiovascular testing.  The patient was advised that if she develops new symptoms prior to surgery to contact our office to arrange for a follow-up visit, and she verbalized understanding. Per office protocol, patient can hold warfarin for 5 days prior to procedure. Patient WILL need bridging with Lovenox (enoxaparin) around procedure. She verbalized that she has received instructions and denies questions at this time."  Pt reports to PAT nurse she took her last dose of warfarin on 5/15 and did not start lovenox bridge.  Message sent to cardio and pharm to make them aware. Per cardio pt should understand risk of clot, no further input.   Pt took last dose of Farxiga today, 11/23/2022.   Discussed with Dr. Chaney Malling, can proceed despite non-compliance.  VS: There were no vitals taken for this visit.  PROVIDERS: [name], MD  Primary  Cardiologist:  Chilton Si, MD  LABS: Labs reviewed: Acceptable for surgery. (all labs ordered are listed, but only abnormal results are displayed)  Labs Reviewed - No data to display   IMAGES:   EKG:   CV: Myocardial perfusion 10/09/2019 Nuclear stress EF: 74%. The left ventricular ejection fraction is hyperdynamic (>65%). There was no ST segment deviation noted during stress. This is a low risk study. There is no evidence of ischemia or previous infarction. The study is normal.  Echo 04/25/2015 Study Conclusions   - Left ventricle: The cavity size was normal. Systolic function was    normal. The estimated ejection fraction was in the range of 55%    to 60%. Wall motion was normal; there were no regional wall    motion abnormalities. Features are consistent with a pseudonormal    left ventricular filling pattern, with concomitant abnormal    relaxation and increased filling pressure (grade 2 diastolic    dysfunction).  - Mitral valve: Calcified annulus. There was mild regurgitation    directed posteriorly.   Cardiac Cath 05/26/2015 Mid LAD lesion, 50% stenosed. FFR of this lesion showed value of 0.85. This is felt to be nonsignificant. Normal LVEDP.    Continue aggressive medical therapy. Would consider adding long-acting nitrate as the patient may have some component of vasospasm. Of note, in the future , if repeat catheterization was needed , would consider using a 5 [place] guide catheter as her left main is very short and the catheter tends to deep seat  Into either the LAD or the circumflex.   Past Medical History:  Diagnosis Date   Antral gastritis  EGD 11/15   Arthritis    Asthmatic bronchitis    Back pain    Breast cancer (HCC)    right breast   CAD in native artery 03/10/2021   Chronic diastolic heart failure (HCC) 05/20/2015   Grade 2 diastolic dysfunction.  04/2015.   Chronic kidney disease    kidney function low   COPD (chronic obstructive  pulmonary disease) (HCC)    Diabetes mellitus    x 5 yrs   DVT of axillary vein, acute left (HCC) 07/24/2012   GERD (gastroesophageal reflux disease)    Glaucoma    POAG OU   Heart murmur    rheum fever at age 87   History of hiatal hernia    History of kidney stones    Hyperlipidemia 05/20/2015   Hypertension    Hypertensive retinopathy    OU   Hypothyroidism    Kidney stones    Macular degeneration    Wet OD, Dry OS   Mixed hyperlipidemia    OSA (obstructive sleep apnea) 12/17/2021   does not use cpap on regular basis   Peripheral venous insufficiency    Pinched nerve    right elbow   Pneumonia    PONV (postoperative nausea and vomiting)    Sigmoid diverticulitis    Snoring 03/10/2021   Vertigo    chonic    Past Surgical History:  Procedure Laterality Date   ABDOMINAL HYSTERECTOMY     BACK SURGERY     spinal    BREAST LUMPECTOMY WITH RADIOACTIVE SEED LOCALIZATION Right 12/03/2020   Procedure: RIGHT BREAST LUMPECTOMY WITH RADIOACTIVE SEED LOCALIZATION;  Surgeon: Manus Rudd, MD;  Location: [facility];  Service: General;  Laterality: Right;   CARDIAC CATHETERIZATION N/A 05/26/2015   Procedure: Left Heart Cath and Coronary Angiography;  Surgeon: Corky Crafts, MD;  Location: [facility] INVASIVE CV LAB;  Service: Cardiovascular;  Laterality: N/A;   CATARACT EXTRACTION Bilateral    CHOLECYSTECTOMY     COLON SURGERY     COLONOSCOPY N/A 12/27/2013   Procedure: COLONOSCOPY;  Surgeon: Malissa Hippo, MD;  [facility];  Service: Endoscopy;  Laterality: N/A;  200   COLOSTOMY CLOSURE     ENDARTERECTOMY Left 03/10/2022   Procedure: LEFT CAROTID ENDARTERECTOMY;  Surgeon: Leonie Douglas, MD;  Location: [facility] OR;  Service: Vascular;  Laterality: Left;   ESOPHAGOGASTRODUODENOSCOPY N/A 05/07/2014   Procedure: ESOPHAGOGASTRODUODENOSCOPY (EGD);  Surgeon: Malissa Hippo, MD;  [facility];  Service: Endoscopy;  Laterality: N/A;   EYE SURGERY Bilateral    Cat Sx    fracture left foot     HERNIA REPAIR     NM MYOCAR PERF WALL MOTION  01/28/2009   Normal   OTHER SURGICAL HISTORY     colostomy, colostomy reversal, for diverticulitis surgical hernia repair, arm surgery, neck surgery   PATCH ANGIOPLASTY Left 03/10/2022   Procedure: PATCH ANGIOPLASTY WITH 1X6CM Livia Snellen PATCH;  Surgeon: Leonie Douglas, MD;  Location: [facility];  Service: Vascular;  Laterality: Left;   US ECHOCARDIOGRAPHY  02/11/2010   Mild MR,trace TR & AI    MEDICATIONS:  albuterol (PROVENTIL HFA;VENTOLIN HFA) 108 (90 BASE) MCG/ACT inhaler   albuterol (PROVENTIL) (2.5 MG/3ML) 0.083% nebulizer solution   amitriptyline (ELAVIL) 25 MG tablet   anastrozole (ARIMIDEX) 1 MG tablet   carvedilol (COREG) 6.25 MG tablet   cetirizine (ZYRTEC) 10 MG tablet   Cholecalciferol (VITAMIN D-3) 1000 units CAPS   diazepam (VALIUM) 2 MG tablet   dorzolamide-timolol (  COSOPT) 22.3-6.8 MG/ML ophthalmic solution   enoxaparin (LOVENOX) 120 MG/0.8ML injection   esomeprazole (NEXIUM) 20 MG capsule   FARXIGA 5 MG TABS tablet   fluticasone (FLOVENT HFA) 110 MCG/ACT inhaler   furosemide (LASIX) 40 MG tablet   gabapentin (NEURONTIN) 300 MG capsule   glimepiride (AMARYL) 2 MG tablet   hydrALAZINE (APRESOLINE) 25 MG tablet   isosorbide mononitrate (IMDUR) 30 MG 24 hr tablet   latanoprost (XALATAN) 0.005 % ophthalmic solution   meclizine (ANTIVERT) 25 MG tablet   Multiple Vitamins-Minerals (PRESERVISION AREDS 2 PO)   NON FORMULARY   Omega-3 Fatty Acids (FISH OIL) 1200 MG CAPS   ondansetron (ZOFRAN-ODT) 4 MG disintegrating tablet   oxyCODONE-acetaminophen (PERCOCET/ROXICET) 5-325 MG tablet   Polyethyl Glycol-Propyl Glycol (SYSTANE OP)   polyethylene glycol (MIRALAX / GLYCOLAX) packet   potassium chloride SA (KLOR-CON M) 20 MEQ tablet   PRALUENT 75 MG/ML SOAJ   promethazine-dextromethorphan (PROMETHAZINE-DM) 6.25-15 MG/5ML syrup   tamsulosin (FLOMAX) 0.4 MG CAPS capsule   TRADJENTA 5 MG TABS tablet   vitamin  B-12 (CYANOCOBALAMIN) 100 MCG tablet   warfarin (COUMADIN) 2 MG tablet   No current facility-administered medications for this encounter.    Jodell Cipro Ward, PA-C [facility] [phone]

## 2022-11-23 NOTE — Telephone Encounter (Signed)
Requested Prescriptions  Pending Prescriptions Disp Refills   FLOVENT HFA 110 MCG/ACT inhaler [Pharmacy Med Name: FLOVENT HFA 110 MCG INHALER] 12 each     Sig: INHALE 1 PUFF INTO THE LUNGS 2 (TWO) TIMES DAILY. RINSE MOUTH WITH WATER AFTER EACH USE     There is no refill protocol information for this order      

## 2022-11-24 ENCOUNTER — Ambulatory Visit (HOSPITAL_COMMUNITY)
Admission: RE | Admit: 2022-11-24 | Discharge: 2022-11-24 | Disposition: A | Discharge status: Home / Self Care | Payer: PPO | Attending: Urology | Admitting: Urology

## 2022-11-24 ENCOUNTER — Encounter (HOSPITAL_COMMUNITY): Payer: Self-pay | Admitting: Urology

## 2022-11-24 ENCOUNTER — Encounter (HOSPITAL_COMMUNITY)
Admission: RE | Disposition: A | Discharge status: Home / Self Care | Payer: Self-pay | Source: Home / Self Care | Attending: Urology

## 2022-11-24 ENCOUNTER — Ambulatory Visit (HOSPITAL_COMMUNITY): Payer: PPO | Admitting: Physician Assistant

## 2022-11-24 ENCOUNTER — Ambulatory Visit (HOSPITAL_COMMUNITY): Payer: PPO

## 2022-11-24 ENCOUNTER — Ambulatory Visit (HOSPITAL_BASED_OUTPATIENT_CLINIC_OR_DEPARTMENT_OTHER): Payer: PPO | Admitting: Anesthesiology

## 2022-11-24 DIAGNOSIS — K219 Gastro-esophageal reflux disease without esophagitis: Secondary | ICD-10-CM | POA: Diagnosis not present

## 2022-11-24 DIAGNOSIS — I5032 Chronic diastolic (congestive) heart failure: Secondary | ICD-10-CM | POA: Insufficient documentation

## 2022-11-24 DIAGNOSIS — N189 Chronic kidney disease, unspecified: Secondary | ICD-10-CM | POA: Insufficient documentation

## 2022-11-24 DIAGNOSIS — E1122 Type 2 diabetes mellitus with diabetic chronic kidney disease: Secondary | ICD-10-CM | POA: Insufficient documentation

## 2022-11-24 DIAGNOSIS — J4489 Other specified chronic obstructive pulmonary disease: Secondary | ICD-10-CM | POA: Insufficient documentation

## 2022-11-24 DIAGNOSIS — N132 Hydronephrosis with renal and ureteral calculous obstruction: Secondary | ICD-10-CM | POA: Diagnosis not present

## 2022-11-24 DIAGNOSIS — I2510 Atherosclerotic heart disease of native coronary artery without angina pectoris: Secondary | ICD-10-CM

## 2022-11-24 DIAGNOSIS — Z7984 Long term (current) use of oral hypoglycemic drugs: Secondary | ICD-10-CM | POA: Diagnosis not present

## 2022-11-24 DIAGNOSIS — N201 Calculus of ureter: Secondary | ICD-10-CM | POA: Diagnosis not present

## 2022-11-24 DIAGNOSIS — Z01818 Encounter for other preprocedural examination: Secondary | ICD-10-CM

## 2022-11-24 DIAGNOSIS — G4733 Obstructive sleep apnea (adult) (pediatric): Secondary | ICD-10-CM | POA: Insufficient documentation

## 2022-11-24 DIAGNOSIS — I130 Hypertensive heart and chronic kidney disease with heart failure and stage 1 through stage 4 chronic kidney disease, or unspecified chronic kidney disease: Secondary | ICD-10-CM | POA: Insufficient documentation

## 2022-11-24 DIAGNOSIS — Z87891 Personal history of nicotine dependence: Secondary | ICD-10-CM | POA: Insufficient documentation

## 2022-11-24 DIAGNOSIS — I10 Essential (primary) hypertension: Secondary | ICD-10-CM | POA: Diagnosis not present

## 2022-11-24 DIAGNOSIS — M1990 Unspecified osteoarthritis, unspecified site: Secondary | ICD-10-CM | POA: Insufficient documentation

## 2022-11-24 DIAGNOSIS — Z8249 Family history of ischemic heart disease and other diseases of the circulatory system: Secondary | ICD-10-CM | POA: Insufficient documentation

## 2022-11-24 DIAGNOSIS — Z79899 Other long term (current) drug therapy: Secondary | ICD-10-CM | POA: Diagnosis not present

## 2022-11-24 HISTORY — PX: CYSTOSCOPY/URETEROSCOPY/HOLMIUM LASER/STENT PLACEMENT: SHX6546

## 2022-11-24 LAB — GLUCOSE, CAPILLARY
Glucose-Capillary: 153 mg/dL — ABNORMAL HIGH (ref 70–99)
Glucose-Capillary: 157 mg/dL — ABNORMAL HIGH (ref 70–99)

## 2022-11-24 LAB — PROTIME-INR
INR: 1.1 (ref 0.8–1.2)
Prothrombin Time: 14.4 seconds (ref 11.4–15.2)

## 2022-11-24 SURGERY — CYSTOSCOPY/URETEROSCOPY/HOLMIUM LASER/STENT PLACEMENT
Anesthesia: General | Laterality: Right

## 2022-11-24 MED ORDER — FENTANYL CITRATE PF 50 MCG/ML IJ SOSY
PREFILLED_SYRINGE | INTRAMUSCULAR | Status: AC
  Filled 2022-11-24: qty 1

## 2022-11-24 MED ORDER — ACETAMINOPHEN 10 MG/ML IV SOLN
INTRAVENOUS | Status: AC
  Filled 2022-11-24: qty 100

## 2022-11-24 MED ORDER — CHLORHEXIDINE GLUCONATE 0.12 % MT SOLN
15.0000 mL | Freq: Once | OROMUCOSAL | Status: AC
  Administered 2022-11-24: 15 mL via OROMUCOSAL

## 2022-11-24 MED ORDER — OXYCODONE HCL 5 MG PO TABS
ORAL_TABLET | ORAL | Status: AC
  Filled 2022-11-24: qty 1

## 2022-11-24 MED ORDER — EPHEDRINE SULFATE-NACL 50-0.9 MG/10ML-% IV SOSY
PREFILLED_SYRINGE | INTRAVENOUS | Status: DC | PRN
  Administered 2022-11-24: 5 mg via INTRAVENOUS
  Administered 2022-11-24 (×2): 10 mg via INTRAVENOUS

## 2022-11-24 MED ORDER — LIDOCAINE HCL (PF) 2 % IJ SOLN
INTRAMUSCULAR | Status: AC
  Filled 2022-11-24: qty 5

## 2022-11-24 MED ORDER — ONDANSETRON HCL 4 MG/2ML IJ SOLN
INTRAMUSCULAR | Status: DC | PRN
  Administered 2022-11-24: 4 mg via INTRAVENOUS

## 2022-11-24 MED ORDER — LIDOCAINE 2% (20 MG/ML) 5 ML SYRINGE
INTRAMUSCULAR | Status: DC | PRN
  Administered 2022-11-24: 60 mg via INTRAVENOUS

## 2022-11-24 MED ORDER — KETOROLAC TROMETHAMINE 30 MG/ML IJ SOLN
INTRAMUSCULAR | Status: AC
  Filled 2022-11-24: qty 1

## 2022-11-24 MED ORDER — ONDANSETRON HCL 4 MG/2ML IJ SOLN: 4.0000 mg | Freq: Once | INTRAMUSCULAR | Status: DC | PRN

## 2022-11-24 MED ORDER — OXYCODONE HCL 5 MG PO TABS
5.0000 mg | ORAL_TABLET | Freq: Once | ORAL | Status: AC
  Administered 2022-11-24: 5 mg via ORAL

## 2022-11-24 MED ORDER — LACTATED RINGERS IV SOLN: INTRAVENOUS | Status: DC

## 2022-11-24 MED ORDER — SODIUM CHLORIDE 0.9 % IR SOLN
Status: DC | PRN
  Administered 2022-11-24: 3000 mL via INTRAVESICAL

## 2022-11-24 MED ORDER — DEXAMETHASONE SODIUM PHOSPHATE 10 MG/ML IJ SOLN
INTRAMUSCULAR | Status: DC | PRN
  Administered 2022-11-24: 4 mg via INTRAVENOUS

## 2022-11-24 MED ORDER — FENTANYL CITRATE (PF) 100 MCG/2ML IJ SOLN
INTRAMUSCULAR | Status: DC | PRN
  Administered 2022-11-24: 25 ug via INTRAVENOUS

## 2022-11-24 MED ORDER — ONDANSETRON HCL 4 MG/2ML IJ SOLN
INTRAMUSCULAR | Status: AC
  Filled 2022-11-24: qty 2

## 2022-11-24 MED ORDER — ORAL CARE MOUTH RINSE: 15.0000 mL | Freq: Once | OROMUCOSAL | Status: AC

## 2022-11-24 MED ORDER — ACETAMINOPHEN 10 MG/ML IV SOLN
1000.0000 mg | Freq: Once | INTRAVENOUS | Status: AC
  Administered 2022-11-24: 1000 mg via INTRAVENOUS

## 2022-11-24 MED ORDER — DEXAMETHASONE SODIUM PHOSPHATE 10 MG/ML IJ SOLN
INTRAMUSCULAR | Status: AC
  Filled 2022-11-24: qty 1

## 2022-11-24 MED ORDER — FENTANYL CITRATE PF 50 MCG/ML IJ SOSY
25.0000 ug | PREFILLED_SYRINGE | INTRAMUSCULAR | Status: DC | PRN
  Administered 2022-11-24 (×3): 50 ug via INTRAVENOUS

## 2022-11-24 MED ORDER — KETOROLAC TROMETHAMINE 30 MG/ML IJ SOLN
15.0000 mg | Freq: Once | INTRAMUSCULAR | Status: AC
  Administered 2022-11-24: 15 mg via INTRAVENOUS

## 2022-11-24 MED ORDER — ACETAMINOPHEN 500 MG PO TABS
1000.0000 mg | ORAL_TABLET | Freq: Once | ORAL | Status: AC
  Administered 2022-11-24: 1000 mg via ORAL
  Filled 2022-11-24: qty 2

## 2022-11-24 MED ORDER — IOHEXOL 300 MG/ML  SOLN
INTRAMUSCULAR | Status: DC | PRN
  Administered 2022-11-24: 10 mL via URETHRAL

## 2022-11-24 MED ORDER — KETOROLAC TROMETHAMINE 30 MG/ML IJ SOLN: 30.0000 mg | Freq: Once | INTRAMUSCULAR | Status: DC

## 2022-11-24 MED ORDER — FENTANYL CITRATE (PF) 100 MCG/2ML IJ SOLN
INTRAMUSCULAR | Status: AC
  Filled 2022-11-24: qty 2

## 2022-11-24 MED ORDER — PROPOFOL 10 MG/ML IV BOLUS
INTRAVENOUS | Status: DC | PRN
  Administered 2022-11-24: 130 mg via INTRAVENOUS

## 2022-11-24 MED ORDER — CIPROFLOXACIN IN D5W 400 MG/200ML IV SOLN
400.0000 mg | Freq: Two times a day (BID) | INTRAVENOUS | Status: DC
  Administered 2022-11-24: 400 mg via INTRAVENOUS
  Filled 2022-11-24: qty 200

## 2022-11-24 SURGICAL SUPPLY — 25 items
BAG URO CATCHER STRL LF (MISCELLANEOUS) ×1 IMPLANT
BASKET LASER NITINOL 1.9FR (BASKET) IMPLANT
BASKET ZERO TIP NITINOL 2.4FR (BASKET) IMPLANT
BSKT STON RTRVL 120 1.9FR (BASKET)
BSKT STON RTRVL ZERO TP 2.4FR (BASKET)
CATH URETERAL DUAL LUMEN 10F (MISCELLANEOUS) IMPLANT
CATH URETL OPEN END 6FR 70 (CATHETERS) ×1 IMPLANT
CLOTH BEACON ORANGE TIMEOUT ST (SAFETY) ×1 IMPLANT
EXTRACTOR STONE 1.7FRX115CM (UROLOGICAL SUPPLIES) IMPLANT
GLOVE BIO SURGEON STRL SZ7.5 (GLOVE) ×1 IMPLANT
GOWN STRL REUS W/ TWL XL LVL3 (GOWN DISPOSABLE) ×1 IMPLANT
GOWN STRL REUS W/TWL XL LVL3 (GOWN DISPOSABLE) ×1
GUIDEWIRE ANG ZIPWIRE 038X150 (WIRE) IMPLANT
GUIDEWIRE STR DUAL SENSOR (WIRE) ×1 IMPLANT
KIT TURNOVER KIT A (KITS) IMPLANT
LASER FIB FLEXIVA PULSE ID 365 (Laser) IMPLANT
MANIFOLD NEPTUNE II (INSTRUMENTS) ×1 IMPLANT
PACK CYSTO (CUSTOM PROCEDURE TRAY) ×1 IMPLANT
SHEATH NAVIGATOR HD 11/13X28 (SHEATH) IMPLANT
SHEATH NAVIGATOR HD 11/13X36 (SHEATH) IMPLANT
STENT URET 6FRX24 CONTOUR (STENTS) IMPLANT
TRACTIP FLEXIVA PULS ID 200XHI (Laser) IMPLANT
TRACTIP FLEXIVA PULSE ID 200 (Laser) ×1
TUBING CONNECTING 10 (TUBING) ×1 IMPLANT
TUBING UROLOGY SET (TUBING) ×1 IMPLANT

## 2022-11-24 NOTE — H&P (Signed)
H&P  Chief Complaint: right ureteral calculus  History of Present Illness: 82 YO F with right ureteral calculus presents for cysto, right URS/LL/stent  Past Medical History:  Diagnosis Date   Antral gastritis    EGD 11/15   Arthritis    Asthmatic bronchitis    Back pain    Breast cancer (HCC)    right breast   CAD in native artery 03/10/2021   Chronic diastolic heart failure (HCC) 05/20/2015   Grade 2 diastolic dysfunction.  04/2015.   Chronic kidney disease    kidney function low   COPD (chronic obstructive pulmonary disease) (HCC)    Diabetes mellitus    x 5 yrs   DVT of axillary vein, acute left (HCC) 07/24/2012   GERD (gastroesophageal reflux disease)    Glaucoma    POAG OU   Heart murmur    rheum fever at age 54   History of hiatal hernia    History of kidney stones    Hyperlipidemia 05/20/2015   Hypertension    Hypertensive retinopathy    OU   Hypothyroidism    Kidney stones    Macular degeneration    Wet OD, Dry OS   Mixed hyperlipidemia    OSA (obstructive sleep apnea) 12/17/2021   does not use cpap on regular basis   Peripheral venous insufficiency    Pinched nerve    right elbow   Pneumonia    PONV (postoperative nausea and vomiting)    Sigmoid diverticulitis    Snoring 03/10/2021   Vertigo    chonic   Past Surgical History:  Procedure Laterality Date   ABDOMINAL HYSTERECTOMY     BACK SURGERY     spinal    BREAST LUMPECTOMY WITH RADIOACTIVE SEED LOCALIZATION Right 12/03/2020   Procedure: RIGHT BREAST LUMPECTOMY WITH RADIOACTIVE SEED LOCALIZATION;  Surgeon: Manus Rudd, MD;  Location[facility];  Service: General;  Laterality: Right;   CARDIAC CATHETERIZATION N/A 05/26/2015   Procedure: Left Heart Cath and Coronary Angiography;  Surgeon: Corky Crafts, MD;  Location: [facility] INVASIVE CV LAB;  Service: Cardiovascular;  Laterality: N/A;   CATARACT EXTRACTION Bilateral    CHOLECYSTECTOMY     COLON SURGERY     COLONOSCOPY N/A 12/27/2013   Procedure:  COLONOSCOPY;  Surgeon: Malissa Hippo, MD;  [facility];  Service: Endoscopy;  Laterality: N/A;  200   COLOSTOMY CLOSURE     ENDARTERECTOMY Left 03/10/2022   Procedure: LEFT CAROTID ENDARTERECTOMY;  Surgeon: Leonie Douglas, MD;  Location: [facility] OR;  Service: Vascular;  Laterality: Left;   ESOPHAGOGASTRODUODENOSCOPY N/A 05/07/2014   Procedure: ESOPHAGOGASTRODUODENOSCOPY (EGD);  Surgeon: Malissa Hippo, MD;  [facility];  Service: Endoscopy;  Laterality: N/A;   EYE SURGERY Bilateral    Cat Sx   fracture left foot     HERNIA REPAIR     NM MYOCAR PERF WALL MOTION  01/28/2009   Normal   OTHER SURGICAL HISTORY     colostomy, colostomy reversal, for diverticulitis surgical hernia repair, arm surgery, neck surgery   PATCH ANGIOPLASTY Left 03/10/2022   Procedure: PATCH ANGIOPLASTY WITH 1X6CM Kathleen Lime;  Surgeon: Leonie Douglas, MD;  Location: [facility];  Service: Vascular;  Laterality: Left;   US ECHOCARDIOGRAPHY  02/11/2010   Mild MR,trace TR & AI    Home Medications:  Medications Prior to Admission  Medication Sig Dispense Refill Last Dose   albuterol (PROVENTIL HFA;VENTOLIN HFA) 108 (90 BASE) MCG/ACT inhaler Inhale 2 puffs into the  lungs every 4 (four) hours as needed for shortness of breath. 1 Inhaler 0 Past Week   amitriptyline (ELAVIL) 25 MG tablet Take 25 mg by mouth at bedtime.   11/23/2022   anastrozole (ARIMIDEX) 1 MG tablet Take 1 tablet (1 mg total) by mouth at bedtime. 90 tablet 3 11/23/2022   carvedilol (COREG) 6.25 MG tablet Take 1 tablet (6.25 mg total) by mouth 2 (two) times daily. 180 tablet 3 11/24/2022 at 0645   cetirizine (ZYRTEC) 10 MG tablet Take 10 mg by mouth daily.   11/23/2022   Cholecalciferol (VITAMIN D-3) 1000 units CAPS Take 1,000 Units by mouth daily.   Past Week   diazepam (VALIUM) 2 MG tablet Take 2 mg by mouth 2 (two) times daily as needed for anxiety (prior to eye injections).      dorzolamide-timolol (COSOPT) 22.3-6.8 MG/ML ophthalmic  solution INSTILL 1 DROP INTO RIGHT EYE TWICE A DAY (Patient taking differently: Place 1 drop into the right eye 2 (two) times daily.) 30 mL 2    esomeprazole (NEXIUM) 20 MG capsule Take 20 mg by mouth daily at 12 noon.   11/23/2022   FARXIGA 5 MG TABS tablet Take 5 mg by mouth every morning.   11/23/2022   fluticasone (FLOVENT HFA) 110 MCG/ACT inhaler Inhale 1 puff into the lungs 2 (two) times daily. Rinse mouth with water after each use 1 each 0 11/23/2022   furosemide (LASIX) 40 MG tablet TAKE 1 TABLET (40 MG TOTAL) BY MOUTH DAILY. MAY TAKE EXTRA DAILY AS NEEDED FOR SWELLING (Patient taking differently: Take 40 mg by mouth daily.) 180 tablet 0 11/23/2022   gabapentin (NEURONTIN) 300 MG capsule Take 300 mg by mouth 3 (three) times daily as needed (pain).   11/24/2022 at 0645   glimepiride (AMARYL) 2 MG tablet Take 2 mg by mouth daily.  2 11/23/2022   hydrALAZINE (APRESOLINE) 25 MG tablet TAKE ONE TABLET BY MOUTH AT BREAKFAST AND AT BEDTIME 180 tablet 1 11/24/2022 at 0645   isosorbide mononitrate (IMDUR) 30 MG 24 hr tablet TAKE ONE TABLET BY MOUTH ONCE DAILY 90 tablet 3 11/24/2022   latanoprost (XALATAN) 0.005 % ophthalmic solution Place 1 drop into the left eye at bedtime.   11/23/2022   meclizine (ANTIVERT) 25 MG tablet Take 25 mg by mouth 2 (two) times daily as needed for dizziness.   11/23/2022   Multiple Vitamins-Minerals (PRESERVISION AREDS 2 PO) Take 1 capsule by mouth in the morning and at bedtime.   Past Week   NON FORMULARY CBD Gummy   Past Week   Omega-3 Fatty Acids (FISH OIL) 1200 MG CAPS Take 1,200 mg by mouth 2 (two) times daily.   Past Week   Polyethyl Glycol-Propyl Glycol (SYSTANE OP) Place 1 drop into the left eye daily as needed (dry eye).   11/23/2022   polyethylene glycol (MIRALAX / GLYCOLAX) packet Take 17 g by mouth daily as needed for moderate constipation.   11/23/2022   potassium chloride SA (KLOR-CON M) 20 MEQ tablet TAKE ONE-HALF TABLET BY  MOUTH DAILY (Patient taking differently: Take  10 mEq by mouth See admin instructions. Only takes when taking) 45 tablet 2 Past Week   PRALUENT 75 MG/ML SOAJ INJECT 75 MG into THE SKIN EVERY 14 DAYS 2 mL 11 Past Month   tamsulosin (FLOMAX) 0.4 MG CAPS capsule Take 1 capsule (0.4 mg total) by mouth daily. 14 capsule 0 11/23/2022   TRADJENTA 5 MG TABS tablet Take 5 mg by mouth daily.   11/23/2022  vitamin B-12 (CYANOCOBALAMIN) 100 MCG tablet Take 100 mcg by mouth daily.   Past Week   albuterol (PROVENTIL) (2.5 MG/3ML) 0.083% nebulizer solution Take 2.5 mg by nebulization every 6 (six) hours as needed for wheezing or shortness of breath.   Unknown   enoxaparin (LOVENOX) 120 MG/0.8ML injection Inject 0.8 mLs (120 mg total) into the skin daily. Rotate injection sites. Give in belly 8 mL 0    ondansetron (ZOFRAN-ODT) 4 MG disintegrating tablet Take 1 tablet (4 mg total) by mouth every 8 (eight) hours as needed. 20 tablet 0 Unknown   oxyCODONE-acetaminophen (PERCOCET/ROXICET) 5-325 MG tablet Take 1 tablet by mouth every 6 (six) hours as needed for severe pain. 15 tablet 0 Unknown   promethazine-dextromethorphan (PROMETHAZINE-DM) 6.25-15 MG/5ML syrup Take 5 mLs by mouth 4 (four) times daily as needed. (Patient not taking: Reported on 11/19/2022) 100 mL 0 Not Taking   warfarin (COUMADIN) 2 MG tablet TAKE ONE TABLET BY MOUTH EVERYDAY AT BEDTIME AS DIRECTED by THE coumadin Clinic (Patient taking differently: Take 2 mg by mouth daily at 4 PM.) 40 tablet 3 11/17/2022   Allergies:  Allergies  Allergen Reactions   Alphagan [Brimonidine] Itching   Diflunisal Swelling    Other reaction(s): ENTIRE BODY SWELLING   Vioxx [Rofecoxib] Shortness Of Breath   Metformin And Related     Kidney failure   Nexlizet [Bempedoic Acid-Ezetimibe]     Causes elevated Liver and Kidney function   Repatha [Evolocumab]     MYALGIAS   Codeine Rash   Elemental Sulfur Rash   Motrin [Ibuprofen] Rash   Penicillins Rash   Pravastatin Rash    Family History  Problem Relation Age  of Onset   CVA Maternal Grandmother 63       deceased   Heart disease Maternal Grandmother    Stroke Maternal Grandmother    Breast cancer Maternal Grandmother    Other Mother 55       Cause unknown   Cancer Mother        liver   Heart attack Brother 83       deceased   Breast cancer Sister    Leukemia Maternal Aunt    Glaucoma Maternal Uncle    Bone cancer Maternal Uncle    Spina bifida Daughter    Social History:  reports that she has quit smoking. Her smoking use included cigarettes. She has never been exposed to tobacco smoke. She has never used smokeless tobacco. She reports that she does not drink alcohol and does not use drugs.  ROS: A complete review of systems was performed.  All systems are negative except for pertinent findings as noted. ROS   Physical Exam:  Vital signs in last 24 hours: Temp:  [98.5 F (36.9 C)] 98.5 F (36.9 C) (05/22 0919) Pulse Rate:  [66] 66 (05/22 0919) Resp:  [16] 16 (05/22 0919) BP: (124)/(58) 124/58 (05/22 0919) SpO2:  [96 %] 96 % (05/22 0919) Weight:  [73 kg] 73 kg (05/22 0911) General:  Alert and oriented, No acute distress HEENT: Normocephalic, atraumatic Neck: No JVD or lymphadenopathy Cardiovascular: Regular rate and rhythm Lungs: Regular rate and effort Abdomen: Soft, nontender, nondistended, no abdominal masses Back: No CVA tenderness Extremities: No edema Neurologic: Grossly intact  Laboratory Data:  Results for orders placed or performed during the hospital encounter of 11/24/22 (from the past 24 hour(s))  PT-INR Day of Surgery per protocol     Status: None   Collection Time: 11/24/22  9:10 AM  Result  Value Ref Range   Prothrombin Time 14.4 11.4 - 15.2 seconds   INR 1.1 0.8 - 1.2  Glucose, capillary     Status: Abnormal   Collection Time: 11/24/22  9:17 AM  Result Value Ref Range   Glucose-Capillary 153 (H) 70 - 99 mg/dL   Comment 1 Notify RN    Comment 2 Document in Chart    No results found for this or any  previous visit (from the past 240 hour(s)). Creatinine: No results for input(s): "CREATININE" in the last 168 hours.  Impression/Assessment:  Right ureteral calculus  Plan:  Proceed with cysto, right URS/LL/stent. R/b discussed  Ray Church, III 11/24/2022, 10:31 AM

## 2022-11-24 NOTE — Anesthesia Postprocedure Evaluation (Signed)
Anesthesia Post Note  Patient: [name]  Procedure(s) Performed: CYSTOSCOPY RIGHT URETEROSCOPY/HOLMIUM LASER/STENT PLACEMENT (Right)     Patient location during evaluation: PACU Anesthesia Type: General Level of consciousness: awake and alert Pain management: pain level controlled Vital Signs Assessment: post-procedure vital signs reviewed and stable Respiratory status: spontaneous breathing, nonlabored ventilation, respiratory function stable and patient connected to nasal cannula oxygen Cardiovascular status: blood pressure returned to baseline and stable Postop Assessment: no apparent nausea or vomiting Anesthetic complications: no   No notable events documented.  Last Vitals:  Vitals:   11/24/22 1415 11/24/22 1453  BP: (!) 93/53 101/60  Pulse: 61 60  Resp: 10 12  Temp:    SpO2: 96% 97%    Last Pain:  Vitals:   11/24/22 1453  TempSrc:   PainSc: 4                  Collene Schlichter

## 2022-11-24 NOTE — Anesthesia Procedure Notes (Signed)
Procedure Name: LMA Insertion Date/Time: 11/24/2022 10:44 AM  Performed by: Pearson Grippe, CRNAPre-anesthesia Checklist: Patient identified, Emergency Drugs available, Suction available and Patient being monitored Patient Re-evaluated:Patient Re-evaluated prior to induction Oxygen Delivery Method: Circle system utilized Preoxygenation: Pre-oxygenation with 100% oxygen Induction Type: IV induction Ventilation: Mask ventilation without difficulty LMA: LMA with gastric port inserted LMA Size: 4.0 Number of attempts: 1 Airway Equipment and Method: Bite block Placement Confirmation: positive ETCO2 Tube secured with: Tape Dental Injury: Teeth and Oropharynx as per pre-operative assessment

## 2022-11-24 NOTE — Op Note (Signed)
Operative Note  Preoperative diagnosis:  1.  Right ureteral calculus  Postoperative diagnosis: 1.  Right ureteral calculus  Procedure(s): 1.  Cystoscopy with right retrograde pyelogram, right ureteroscopy with laser lithotripsy, ureteral stent placement  Surgeon: Modena Slater, MD  Assistants: None  Anesthesia: General  Complications: None immediate  EBL: Minimal  Specimens: 1.  None  Drains/Catheters: 1.  6 x 24 double-J ureteral stent, no string  Intraoperative findings: 1.  Normal urethra and bladder 2.  Approximately 1 cm proximal ureteral calculus dusted to tiny fragments.  Relatively soft stone. 3.  Retrograde pyelogram revealed severe hydroureteronephrosis down to the level of stone impaction area.  Indication: 82 year old female with a proximal right ureteral calculus presents for previously mentioned operation.  Description of procedure:  The patient was identified and consent was obtained.  The patient was taken to the operating room and placed in the supine position.  The patient was placed under general anesthesia.  Perioperative antibiotics were administered.  The patient was placed in dorsal lithotomy.  Patient was prepped and draped in a standard sterile fashion and a timeout was performed.  A 21 French rigid cystoscope was advanced into the urethra and into the bladder.  Complete cystoscopy was performed with no abnormal findings.  The right ureter was cannulated with a sensor wire which was advanced up to the kidney under fluoroscopic guidance.  Semirigid ureteroscopy was performed alongside the wire up to the proximal stone which was laser fragmented to tiny fragments.  All stone was treated.  Fluoroscopy confirmed no further radiopaque calculi.  I shot a retrograde pyelogram through the scope with the findings noted above.  There was some ureteral edema at the level of stone impaction but no obvious ureteral injury or further ureteral stones.  I withdrew the scope  and backloaded the wire onto the rigid cystoscope and advanced that into the bladder followed by routine placement of a 6 x 24 double-J ureteral stent.  Fluoroscopy confirmed proximal placement and direct visualization confirmed a good coil within the bladder.  I drained the bladder and withdrew the scope.  Patient tolerated the procedure well was stable postoperatively.  Plan: Follow-up in about 1 week for stent removal

## 2022-11-24 NOTE — Discharge Instructions (Addendum)

## 2022-11-24 NOTE — Transfer of Care (Signed)
Immediate Anesthesia Transfer of Care Note  Patient: [name]  Procedure(s) Performed: CYSTOSCOPY RIGHT URETEROSCOPY/HOLMIUM LASER/STENT PLACEMENT (Right)  Patient Location: PACU  Anesthesia Type:General  Level of Consciousness: awake, alert , and oriented  Airway & Oxygen Therapy: Patient Spontanous Breathing and Patient connected to face mask oxygen  Post-op Assessment: Report given to RN and Post -op Vital signs reviewed and stable  Post vital signs: Reviewed and stable  Last Vitals:  Vitals Value Taken Time  BP    Temp    Pulse 66 11/24/22 1129  Resp 10 11/24/22 1129  SpO2 100 % 11/24/22 1129  Vitals shown include unvalidated device data.  Last Pain:  Vitals:   11/24/22 0919  TempSrc: Oral  PainSc:          Complications: No notable events documented.

## 2022-11-25 ENCOUNTER — Encounter (HOSPITAL_COMMUNITY): Payer: Self-pay | Admitting: Urology

## 2022-11-30 DIAGNOSIS — N201 Calculus of ureter: Secondary | ICD-10-CM | POA: Diagnosis not present

## 2022-12-01 DIAGNOSIS — N200 Calculus of kidney: Secondary | ICD-10-CM | POA: Diagnosis not present

## 2022-12-01 DIAGNOSIS — Z6827 Body mass index (BMI) 27.0-27.9, adult: Secondary | ICD-10-CM | POA: Diagnosis not present

## 2022-12-01 DIAGNOSIS — E663 Overweight: Secondary | ICD-10-CM | POA: Diagnosis not present

## 2022-12-03 DIAGNOSIS — E1159 Type 2 diabetes mellitus with other circulatory complications: Secondary | ICD-10-CM | POA: Diagnosis not present

## 2022-12-03 DIAGNOSIS — E1165 Type 2 diabetes mellitus with hyperglycemia: Secondary | ICD-10-CM | POA: Diagnosis not present

## 2022-12-03 DIAGNOSIS — E782 Mixed hyperlipidemia: Secondary | ICD-10-CM | POA: Diagnosis not present

## 2022-12-03 DIAGNOSIS — J449 Chronic obstructive pulmonary disease, unspecified: Secondary | ICD-10-CM | POA: Diagnosis not present

## 2022-12-03 DIAGNOSIS — I10 Essential (primary) hypertension: Secondary | ICD-10-CM | POA: Diagnosis not present

## 2022-12-09 NOTE — Progress Notes (Signed)
[facility] - Clinic Note  12/23/2022     CHIEF COMPLAINT Patient presents for Retina Follow Up  HISTORY OF PRESENT ILLNESS: [name] is a 82 y.o. female who presents to the clinic today for:  HPI     Retina Follow Up   Patient presents with  Wet AMD.  In right eye.  This started 6 weeks ago.  I, the attending physician,  performed the HPI with the patient and updated documentation appropriately.        Comments   Patient here for 6 weeks retina follow up for exu ARMD OD. Patient states vision about like usual. No eye pain. Had a kidney stone surgery.      Last edited by Rennis Chris, MD on 12/23/2022  9:03 AM.     Patient states   Referring physician: [name], MD [address] [place],  [place] [address]  HISTORICAL INFORMATION:  Selected notes from the MEDICAL RECORD NUMBER Referred by Dr. Daisy Lazar for concern of SRF OD LEE: 11.27.20 (M. Cotter) [BCVA: OD: 20/80-- OS: 20/60-]  Ocular Hx-glaucoma (latanoprost)  PMH-DM    CURRENT MEDICATIONS: Current Outpatient Medications (Ophthalmic Drugs)  Medication Sig   dorzolamide-timolol (COSOPT) 22.3-6.8 MG/ML ophthalmic solution INSTILL 1 DROP INTO RIGHT EYE TWICE A DAY (Patient taking differently: Place 1 drop into the right eye 2 (two) times daily.)   latanoprost (XALATAN) 0.005 % ophthalmic solution Place 1 drop into the left eye at bedtime.   Polyethyl Glycol-Propyl Glycol (SYSTANE OP) Place 1 drop into the left eye daily as needed (dry eye).   No current facility-administered medications for this visit. (Ophthalmic Drugs)   Current Outpatient Medications (Other)  Medication Sig   albuterol (PROVENTIL HFA;VENTOLIN HFA) 108 (90 BASE) MCG/ACT inhaler Inhale 2 puffs into the lungs every 4 (four) hours as needed for shortness of breath.   albuterol (PROVENTIL) (2.5 MG/3ML) 0.083% nebulizer solution Take 2.5 mg by nebulization every 6 (six) hours as needed for wheezing or shortness of  breath.   amitriptyline (ELAVIL) 25 MG tablet Take 25 mg by mouth at bedtime.   anastrozole (ARIMIDEX) 1 MG tablet Take 1 tablet (1 mg total) by mouth at bedtime.   carvedilol (COREG) 6.25 MG tablet Take 1 tablet (6.25 mg total) by mouth 2 (two) times daily.   cetirizine (ZYRTEC) 10 MG tablet Take 10 mg by mouth daily.   Cholecalciferol (VITAMIN D-3) 1000 units CAPS Take 1,000 Units by mouth daily.   diazepam (VALIUM) 2 MG tablet Take 2 mg by mouth 2 (two) times daily as needed for anxiety (prior to eye injections).   esomeprazole (NEXIUM) 20 MG capsule Take 20 mg by mouth daily at 12 noon.   FARXIGA 5 MG TABS tablet Take 5 mg by mouth every morning.   fluticasone (FLOVENT HFA) 110 MCG/ACT inhaler Inhale 1 puff into the lungs 2 (two) times daily. Rinse mouth with water after each use   furosemide (LASIX) 40 MG tablet TAKE 1 TABLET (40 MG TOTAL) BY MOUTH DAILY. MAY TAKE EXTRA DAILY AS NEEDED FOR SWELLING (Patient taking differently: Take 40 mg by mouth daily.)   gabapentin (NEURONTIN) 300 MG capsule Take 300 mg by mouth 3 (three) times daily as needed (pain).   glimepiride (AMARYL) 2 MG tablet Take 2 mg by mouth daily.   hydrALAZINE (APRESOLINE) 25 MG tablet TAKE ONE TABLET BY MOUTH AT BREAKFAST AND AT BEDTIME   isosorbide mononitrate (IMDUR) 30 MG 24 hr tablet TAKE ONE TABLET BY  MOUTH ONCE DAILY   meclizine (ANTIVERT) 25 MG tablet Take 25 mg by mouth 2 (two) times daily as needed for dizziness.   Multiple Vitamins-Minerals (PRESERVISION AREDS 2 PO) Take 1 capsule by mouth in the morning and at bedtime.   NON FORMULARY CBD Gummy   Omega-3 Fatty Acids (FISH OIL) 1200 MG CAPS Take 1,200 mg by mouth 2 (two) times daily.   ondansetron (ZOFRAN-ODT) 4 MG disintegrating tablet Take 1 tablet (4 mg total) by mouth every 8 (eight) hours as needed.   oxyCODONE-acetaminophen (PERCOCET/ROXICET) 5-325 MG tablet Take 1 tablet by mouth every 6 (six) hours as needed for severe pain.   polyethylene glycol (MIRALAX  / GLYCOLAX) packet Take 17 g by mouth daily as needed for moderate constipation.   potassium chloride SA (KLOR-CON M) 20 MEQ tablet TAKE ONE-HALF TABLET BY  MOUTH DAILY (Patient taking differently: Take 10 mEq by mouth See admin instructions. Only takes when taking)   PRALUENT 75 MG/ML SOAJ INJECT 75 MG into THE SKIN EVERY 14 DAYS   tamsulosin (FLOMAX) 0.4 MG CAPS capsule Take 1 capsule (0.4 mg total) by mouth daily.   TRADJENTA 5 MG TABS tablet Take 5 mg by mouth daily.   vitamin B-12 (CYANOCOBALAMIN) 100 MCG tablet Take 100 mcg by mouth daily.   warfarin (COUMADIN) 2 MG tablet TAKE ONE TABLET BY MOUTH EVERYDAY AT BEDTIME AS DIRECTED by THE coumadin Clinic (Patient taking differently: Take 2 mg by mouth daily at 4 PM.)   enoxaparin (LOVENOX) 120 MG/0.8ML injection Inject 0.8 mLs (120 mg total) into the skin daily. Rotate injection sites. Give in belly   promethazine-dextromethorphan (PROMETHAZINE-DM) 6.25-15 MG/5ML syrup Take 5 mLs by mouth 4 (four) times daily as needed. (Patient not taking: Reported on 11/19/2022)   No current facility-administered medications for this visit. (Other)   REVIEW OF SYSTEMS: ROS   Positive for: Genitourinary, Endocrine, Cardiovascular, Eyes Negative for: Constitutional, Gastrointestinal, Neurological, Skin, Musculoskeletal, HENT, Respiratory, Psychiatric, Allergic/Imm, Heme/Lymph Last edited by Laddie Aquas, COA on 12/23/2022  8:05 AM.     ALLERGIES Allergies  Allergen Reactions   Alphagan [Brimonidine] Itching   Diflunisal Swelling    Other reaction(s): ENTIRE BODY SWELLING   Vioxx [Rofecoxib] Shortness Of Breath   Metformin And Related     Kidney failure   Nexlizet [Bempedoic Acid-Ezetimibe]     Causes elevated Liver and Kidney function   Repatha [Evolocumab]     MYALGIAS   Codeine Rash   Elemental Sulfur Rash   Motrin [Ibuprofen] Rash   Penicillins Rash   Pravastatin Rash   PAST MEDICAL HISTORY Past Medical History:  Diagnosis Date    Antral gastritis    EGD 11/15   Arthritis    Asthmatic bronchitis    Back pain    Breast cancer (HCC)    right breast   CAD in native artery 03/10/2021   Chronic diastolic heart failure (HCC) 05/20/2015   Grade 2 diastolic dysfunction.  04/2015.   Chronic kidney disease    kidney function low   COPD (chronic obstructive pulmonary disease) (HCC)    Diabetes mellitus    x 5 yrs   DVT of axillary vein, acute left (HCC) 07/24/2012   GERD (gastroesophageal reflux disease)    Glaucoma    POAG OU   Heart murmur    rheum fever at age 44   History of hiatal hernia    History of kidney stones    Hyperlipidemia 05/20/2015   Hypertension    Hypertensive retinopathy  OU   Hypothyroidism    Kidney stones    Macular degeneration    Wet OD, Dry OS   Mixed hyperlipidemia    OSA (obstructive sleep apnea) 12/17/2021   does not use cpap on regular basis   Peripheral venous insufficiency    Pinched nerve    right elbow   Pneumonia    PONV (postoperative nausea and vomiting)    Sigmoid diverticulitis    Snoring 03/10/2021   Vertigo    chonic   Past Surgical History:  Procedure Laterality Date   ABDOMINAL HYSTERECTOMY     BACK SURGERY     spinal    BREAST LUMPECTOMY WITH RADIOACTIVE SEED LOCALIZATION Right 12/03/2020   Procedure: RIGHT BREAST LUMPECTOMY WITH RADIOACTIVE SEED LOCALIZATION;  Surgeon: Manus Rudd, MD;  Location: [facility] OR;  Service: General;  Laterality: Right;   CARDIAC CATHETERIZATION N/A 05/26/2015   Procedure: Left Heart Cath and Coronary Angiography;  Surgeon: Corky Crafts, MD;  Location: [facility] INVASIVE CV LAB;  Service: Cardiovascular;  Laterality: N/A;   CATARACT EXTRACTION Bilateral    CHOLECYSTECTOMY     COLON SURGERY     COLONOSCOPY N/A 12/27/2013   Procedure: COLONOSCOPY;  Surgeon: Malissa Hippo, MD;  Location: [facility] ENDO SUITE;  Service: Endoscopy;  Laterality: N/A;  200   COLOSTOMY CLOSURE     CYSTOSCOPY/URETEROSCOPY/HOLMIUM LASER/STENT PLACEMENT Right  11/24/2022   Procedure: CYSTOSCOPY RIGHT URETEROSCOPY/HOLMIUM LASER/STENT PLACEMENT;  Surgeon: Crista Elliot, MD;  Location: [facility] ORS;  Service: Urology;  Laterality: Right;  60 MINS FOR CASE   ENDARTERECTOMY Left 03/10/2022   Procedure: LEFT CAROTID ENDARTERECTOMY;  Surgeon: Leonie Douglas, MD;  Location: [facility] OR;  Service: Vascular;  Laterality: Left;   ESOPHAGOGASTRODUODENOSCOPY N/A 05/07/2014   Procedure: ESOPHAGOGASTRODUODENOSCOPY (EGD);  Surgeon: Malissa Hippo, MD;  Location: [facility] SU[facility];  Service: Endoscopy;  Laterality: N/A;   EYE SURGERY Bilateral    Cat Sx   fracture left foot     HERNIA REPAIR     NM MYOCAR PERF WALL MOTION  01/28/2009   Normal   OTHER SURGICAL HISTORY     colostomy, colostomy reversal, for diverticulitis surgical hernia repair, arm surgery, neck surgery   PATCH ANGIOPLASTY Left 03/10/2022   Procedure: PATCH ANGIOPLASTY WITH 1X6CM Kathleen Lime;  Surgeon: Leonie Douglas, MD;  Location: [facility] OR;  Service: Vascular;  Laterality: Left;   US ECHOCARDIOGRAPHY  02/11/2010   Mild MR,trace TR & AI   FAMILY HISTORY Family History  Problem Relation Age of Onset   CVA Maternal Grandmother 74       deceased   Heart disease Maternal Grandmother    Stroke Maternal Grandmother    Breast cancer Maternal Grandmother    Other Mother 34       Cause unknown   Cancer Mother        liver   Heart attack Brother 39       deceased   Breast cancer Sister    Leukemia Maternal Aunt    Glaucoma Maternal Uncle    Bone cancer Maternal Uncle    Spina bifida Daughter    SOCIAL HISTORY Social History   Tobacco Use   Smoking status: Former    Types: Cigarettes    Passive exposure: Never   Smokeless tobacco: Never   Tobacco comments:    Smoke 1-1 1/2 packs a day  Vaping Use   Vaping Use: Never used  Substance Use Topics   Alcohol use: No   Drug  use: No       OPHTHALMIC EXAM: Base Eye Exam     Visual Acuity (Snellen - Linear)       Right Left   Dist [place] 20/40 -1  20/30 -2   Dist ph [place] NI NI         Tonometry (Tonopen, 8:03 AM)       Right Left   Pressure 10 17         Pupils       Dark Light Shape React APD   Right 2 1 Round Brisk None   Left 2 1 Round Brisk None         Visual Fields (Counting fingers)       Left Right    Full Full         Extraocular Movement       Right Left    Full, Ortho Full, Ortho         Neuro/Psych     Oriented x3: Yes   Mood/Affect: Normal         Dilation     Both eyes: 1.0% Mydriacyl, 2.5% Phenylephrine @ 8:02 AM           Slit Lamp and Fundus Exam     Slit Lamp Exam       Right Left   Lids/Lashes Dermatochalasis - upper lid, mild Meibomian gland dysfunction Dermatochalasis - upper lid, Telangiectasia, mild Meibomian gland dysfunction   Conjunctiva/Sclera White and quiet, superior bleb White and quiet   Cornea 1+Punctate epethelial erosions, well healed cataract wound 1+ fine Punctate epithelial erosions, arcus, mild EBMD   Anterior Chamber narrow temporal angle, tube at 1200 Deep and quiet, narrow temporal angle   Iris round and mod dilated Round and moderately dilated to 5.45mm   Lens Posterior chamber intraocular lens, open PC Posterior chamber intraocular lens, trace Posterior capsular opacification   Anterior Vitreous Vitreous syneresis, Posterior vitreous detachment Vitreous syneresis         Fundus Exam       Right Left   Disc Sharp rim, +Pallor, +cupping w/ superior and inferior rim thinning, temporal Peripapillary atrophy mild Pallor, Sharp rim, temporal Peripapillary atrophy   C/D Ratio 0.9 0.5   Macula Flat, Blunted foveal reflex, +focal CNV/PED nasal macula with partial pigment ring, trace IRF/ SRF -- improved, Drusen, RPE mottling and clumping, no heme Flat, Blunted foveal reflex, fine drusen, mild ERM, Retinal pigment epithelial mottling and clumping, No heme or edema   Vessels attenuated, Tortuous attenuated, Tortuous   Periphery Attached, mild reticular  degeneration, No heme, No RT/RD Attached; no heme           IMAGING AND PROCEDURES  Imaging and Procedures for @TODAY @  OCT, Retina - OU - Both Eyes       Right Eye Quality was good. Central Foveal Thickness: 196. Progression has been stable. Findings include normal foveal contour, retinal drusen , subretinal hyper-reflective material, intraretinal hyper-reflective material, intraretinal fluid, pigment epithelial detachment, subretinal fluid, outer retinal atrophy (Stable improvement in focal IRF/SRF overlying nasal PED / CNV, partial PVD).   Left Eye Quality was good. Central Foveal Thickness: 206. Progression has been stable. Findings include normal foveal contour, no IRF, no SRF, retinal drusen (Partial PVD, patchy ORA).   Notes *Images captured and stored on drive  Diagnosis / Impression:  OD: exudative ARMD -- Stable improvement in focal IRF/SRF overlying nasal PED / CNV, partial PVD OS: NFP, no IRF/SRF; +drusen -- nonexudative ARMD  Clinical management:  See below  Abbreviations: NFP - Normal foveal profile. CME - cystoid macular edema. PED - pigment epithelial detachment. IRF - intraretinal fluid. SRF - subretinal fluid. EZ - ellipsoid zone. ERM - epiretinal membrane. ORA - outer retinal atrophy. ORT - outer retinal tubulation. SRHM - subretinal hyper-reflective material      Intravitreal Injection, Pharmacologic Agent - OD - Right Eye       Time Out 12/23/2022. 8:45 AM. Confirmed correct patient, procedure, site, and patient consented.   Anesthesia Topical anesthesia was used. Anesthetic medications included Lidocaine 2%, Proparacaine 0.5%.   Procedure Preparation included 5% betadine to ocular surface, eyelid speculum. A (32g) needle was used.   Injection: 1.25 mg Bevacizumab 1.25mg /0.24ml   Route: Intravitreal, Site: Right Eye   NDC: [id number], Lot: [id number], Expiration date: 04/01/2023   Post-op Post injection exam found visual acuity of at least  counting fingers. The patient tolerated the procedure well. There were no complications. The patient received written and verbal post procedure care education. Post injection medications were not given.            ASSESSMENT/PLAN:   ICD-10-CM   1. Exudative age-related macular degeneration of right eye with active choroidal neovascularization (HCC)  H35.3211 OCT, Retina - OU - Both Eyes    Intravitreal Injection, Pharmacologic Agent - OD - Right Eye    Bevacizumab (AVASTIN) SOLN 1.25 mg    2. Intermediate stage nonexudative age-related macular degeneration of left eye  H35.3122     3. Diabetes mellitus type 2 without retinopathy (HCC)  E11.9     4. Long term (current) use of oral hypoglycemic drugs  Z79.84     5. Essential hypertension  I10     6. Hypertensive retinopathy of both eyes  H35.033     7. Pseudophakia of both eyes  Z96.1     8. Primary open angle glaucoma of both eyes, unspecified glaucoma stage  H40.1130      1. 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, OD  - h/o delayed f/u from 8 wks to 12 on 6.20.22 due to lumpectomy -- dx'd w/ DCIS - h/o delayed follow up from 4 weeks to 8 weeks due to passing of husband (12.11.20-02.12.21)  - s/p IVA OD #1 (12.11.20), #2 (02.12.21), #3 (03.12.21), #4 (04.09.21), #5 (05.14.21), #6 (06.17.21), #7 (07.23.21), #8 (10.15.21), #9 (12.7.21), #10 (03.22.22), #11 (06.20.22), #12 (08.24.22), #13 (10.31.22), #14 (01.03.23), #15 (08.15.23), #16 (11.15.23), #17 (02.29.24), #18 (03.29.24), #19 (05.09.24)  - FA 12.11.20 confirms +CNVM  **history of increased fluid at 7+ months noted on 08.15.23** - OCT today shows stable improvement in focal IRF/SRF overlying nasal PED / CNV --stably improved at 6 weeks  - BCVA OD decreased to 20/40 from 20/30  - recommend IVA OD #20 today, 06.20.24 with follow up extended to 8 weeks  - pt wishes to proceed with injection  - RBA of procedure discussed, questions answered - informed consent obtained and  signed - see procedure note  - Avastin informed consent form re-signed and scanned on 08.15.2023 (OD)             - Continue to use AT OD  - f/u 6 weeks -- DFE/OCT/possible injection  2. Age related macular degeneration, non-exudative, OS  - intermediate stage  - BCVA 20/30 - The incidence, anatomy, and pathology of dry AMD, risk of progression, and the AREDS and AREDS 2 study including smoking risks discussed with patient.   - recommend Amsler grid monitoring  3,4. Diabetes  mellitus, type 2 without retinopathy - The incidence, risk factors for progression, natural history and treatment options for diabetic retinopathy  were discussed with patient.   - The need for close monitoring of blood glucose, blood pressure, and serum lipids, avoiding cigarette or any type of tobacco, and the need for long term follow up was also discussed with patient.  - monitor   5,6. Hypertensive retinopathy OU  - discussed importance of tight BP control  - monitor   7. Pseudophakia OU, PCO OD  - s/p CE/IOL OU (Dr. Harlon Flor)  - IOL in good position  - s/p yag cap OD (04.06.22) -- good PC opening  - monitor   8. POAG OU  - formerly managed by Dr. Harlon Flor -- now following at [facility]  - s/p laser w/ Dr. Harlon Flor -- ?SLT  - s/p trab OD w/ Dr. Zetta Bills  - IOP 10,17  - currently on Latanoprost QHS  Ophthalmic Meds Ordered this visit:  Meds ordered this encounter  Medications   Bevacizumab (AVASTIN) SOLN 1.25 mg     Return in about 8 weeks (around 02/17/2023) for f/u exu ARMD OD, DFE, OCT.  There are no Patient Instructions on file for this visit.  This document serves as a record of services personally performed by Karie Chimera, MD, PhD. It was created on their behalf by Glee Arvin. Manson Passey, OA an ophthalmic technician. The creation of this record is the provider's dictation and/or activities during the visit.    Electronically signed by: Glee Arvin. Manson Passey, [place] 06.06.2024 9:07 AM   Karie Chimera, M.D., Ph.D. Diseases & Surgery of the Retina and Vitreous [facility]  I have reviewed the above documentation for accuracy and completeness, and I agree with the above. Karie Chimera, M.D., Ph.D. 12/23/22 9:11 AM  Abbreviations: M myopia (nearsighted); A astigmatism; H hyperopia (farsighted); P presbyopia; Mrx spectacle prescription;  CTL contact lenses; OD right eye; OS left eye; OU both eyes  XT exotropia; ET esotropia; PEK punctate epithelial keratitis; PEE punctate epithelial erosions; DES dry eye syndrome; MGD meibomian gland dysfunction; ATs artificial tears; PFAT's preservative free artificial tears; NSC nuclear sclerotic cataract; PSC posterior subcapsular cataract; ERM epi-retinal membrane; PVD posterior vitreous detachment; RD retinal detachment; DM diabetes mellitus; DR diabetic retinopathy; NPDR non-proliferative diabetic retinopathy; PDR proliferative diabetic retinopathy; CSME clinically significant macular edema; DME diabetic macular edema; dbh dot blot hemorrhages; CWS cotton wool spot; POAG primary open angle glaucoma; C/D cup-to-disc ratio; HVF humphrey visual field; GVF goldmann visual field; OCT optical coherence tomography; IOP intraocular pressure; BRVO Branch retinal vein occlusion; CRVO central retinal vein occlusion; CRAO central retinal artery occlusion; BRAO branch retinal artery occlusion; RT retinal tear; SB scleral buckle; PPV pars plana vitrectomy; VH Vitreous hemorrhage; PRP panretinal laser photocoagulation; IVK intravitreal kenalog; VMT vitreomacular traction; MH Macular hole;  NVD neovascularization of the disc; NVE neovascularization elsewhere; AREDS age related eye disease study; ARMD age related macular degeneration; POAG primary open angle glaucoma; EBMD epithelial/anterior basement membrane dystrophy; ACIOL anterior chamber intraocular lens; IOL intraocular lens; PCIOL posterior chamber intraocular lens; Phaco/IOL phacoemulsification  with intraocular lens placement; PRK photorefractive keratectomy; LASIK laser assisted in situ keratomileusis; HTN hypertension; DM diabetes mellitus; COPD chronic obstructive pulmonary disease

## 2022-12-16 ENCOUNTER — Ambulatory Visit: Payer: PPO | Attending: Internal Medicine | Admitting: *Deleted

## 2022-12-16 DIAGNOSIS — I82A12 Acute embolism and thrombosis of left axillary vein: Secondary | ICD-10-CM

## 2022-12-16 DIAGNOSIS — Z5181 Encounter for therapeutic drug level monitoring: Secondary | ICD-10-CM

## 2022-12-16 LAB — POCT INR: INR: 2.5 (ref 2.0–3.0)

## 2022-12-16 NOTE — Patient Instructions (Signed)
Continue warfarin 1 tablet daily  Stop Lovenox injections  Recheck INR in 4 wk.  Call Coumadin clinic for any questions or changes in medications.

## 2022-12-23 ENCOUNTER — Encounter (INDEPENDENT_AMBULATORY_CARE_PROVIDER_SITE_OTHER): Payer: Self-pay | Admitting: Ophthalmology

## 2022-12-23 ENCOUNTER — Ambulatory Visit (INDEPENDENT_AMBULATORY_CARE_PROVIDER_SITE_OTHER): Payer: PPO | Admitting: Ophthalmology

## 2022-12-23 DIAGNOSIS — H401130 Primary open-angle glaucoma, bilateral, stage unspecified: Secondary | ICD-10-CM

## 2022-12-23 DIAGNOSIS — Z7984 Long term (current) use of oral hypoglycemic drugs: Secondary | ICD-10-CM | POA: Diagnosis not present

## 2022-12-23 DIAGNOSIS — H35033 Hypertensive retinopathy, bilateral: Secondary | ICD-10-CM | POA: Diagnosis not present

## 2022-12-23 DIAGNOSIS — H353122 Nonexudative age-related macular degeneration, left eye, intermediate dry stage: Secondary | ICD-10-CM | POA: Diagnosis not present

## 2022-12-23 DIAGNOSIS — I10 Essential (primary) hypertension: Secondary | ICD-10-CM

## 2022-12-23 DIAGNOSIS — H353211 Exudative age-related macular degeneration, right eye, with active choroidal neovascularization: Secondary | ICD-10-CM

## 2022-12-23 DIAGNOSIS — Z961 Presence of intraocular lens: Secondary | ICD-10-CM

## 2022-12-23 DIAGNOSIS — E119 Type 2 diabetes mellitus without complications: Secondary | ICD-10-CM

## 2022-12-23 MED ORDER — BEVACIZUMAB CHEMO INJECTION 1.25MG/0.05ML SYRINGE FOR KALEIDOSCOPE
1.2500 mg | INTRAVITREAL | Status: AC | PRN
Start: 2022-12-23 — End: 2022-12-23
  Administered 2022-12-23: 1.25 mg via INTRAVITREAL

## 2022-12-28 ENCOUNTER — Encounter (HOSPITAL_BASED_OUTPATIENT_CLINIC_OR_DEPARTMENT_OTHER): Payer: Self-pay | Admitting: Cardiovascular Disease

## 2022-12-28 ENCOUNTER — Ambulatory Visit (INDEPENDENT_AMBULATORY_CARE_PROVIDER_SITE_OTHER): Payer: PPO | Admitting: Cardiovascular Disease

## 2022-12-28 VITALS — BP 176/72 | HR 59 | Ht 62.0 in

## 2022-12-28 DIAGNOSIS — I6522 Occlusion and stenosis of left carotid artery: Secondary | ICD-10-CM

## 2022-12-28 DIAGNOSIS — I10 Essential (primary) hypertension: Secondary | ICD-10-CM

## 2022-12-28 DIAGNOSIS — E7849 Other hyperlipidemia: Secondary | ICD-10-CM | POA: Diagnosis not present

## 2022-12-28 DIAGNOSIS — I2510 Atherosclerotic heart disease of native coronary artery without angina pectoris: Secondary | ICD-10-CM

## 2022-12-28 DIAGNOSIS — G4733 Obstructive sleep apnea (adult) (pediatric): Secondary | ICD-10-CM

## 2022-12-28 DIAGNOSIS — I5032 Chronic diastolic (congestive) heart failure: Secondary | ICD-10-CM

## 2022-12-28 NOTE — Progress Notes (Signed)
Cardiology Office Note:  .   Date:  12/28/2022  ID:  [name], DOB May 22, 1941, MRN [id number] PCP: [name], MD  [facility] Providers Cardiologist:  Chilton Si, MD    History of Present Illness: .   [name] is a 82 y.o. female with CAD (50% LAD), chronic diastolic heart failure (grade 2), hypertension, hyperlipidemia, prior DVT on warfarin, breast cancer s/p lumpectomy and XRT, and diabetes type 2 who presents for follow up.  She was first seen 04/2015 at which time she reported occasional chest pain.  She had an exercise Myoview 04/2015 that showed LVEF 78% with a small defect of moderate severity in the mid anterior and apical anterior region.  This was felt to be due to breast attenuation artifact.  However, she underwent cardiac catheterization on 05/26/15 that revealed a 50% LAD lesion. There was concern that there may be a component of vasospasm so long-acting nitrates were started. Her blood pressure and hyperlipidemia medications have been titrated due to poor control.  She is also on Lasix due to lower extremity edema.   [name]' husband died of COVID double pneumonia on 07/2019.  He was treated with Remdesivir at [facility] outpatient infusion center.  She was experiencing atypical chest pain.  She was referred for [facility] that revealed LVEF 74% with no ischemia.  Since then she called our office with concern for dizziness.  She spoke with our pharmacist and was advised to stop her hydralazine.  However her dizziness did not improve and it was not thought to be due to the medication or her blood pressure.  She had an episode while sitting on the toilet had to grab the garbage can to vomit.  They recommended that she see her PCP.  She had vertigo in the past and has used meclizine.  Her LFTs were elevated so her PCP stopped her pravastatin.  She since started Praluent.    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  She underwent lumpectomy and  XRT. She had a sleep study 03/2021 which showed mild sleep apnea. Given underling medical conditions, it was recommended she start an oral airway device or a CPAP.  She followed up on 8/23 and a CTA was recommended which confirmed severe stenosis in the left common carotid and ICA. She underwent carotid endarterectomy 03/24/22. She has had issues with volume overload requiring diuresis.   Today, she reports multiple procedures in the interim. She had a stent placed in her eye, and also underwent surgery to remove a kidney stone. Her related flank pain has improved somewhat but remains present. She is scheduled for an ultrasound later this month for reassessment. She complains of intermittent leg swelling. On an average week she may need to use her furosemide 1-2 times when she knows she is able to stay at home given subsequent urinary frequency. Of note, she also has new intermittent pain in her occipital head/neck with a recent sudden onset. She describes this as a throbbing pain, not worse with turning/twisting her head. This is a new symptom and hasn't been an issue previously. In the office today her blood pressure is initially 176/64, and 176/72 on recheck. She hasn't monitored her readings at home lately as she believes her BP cuff needs a new battery. She isn't sure how to replace this; we discussed bringing her cuff to her pharmacy or one of her providers' offices for assistance. Currently she isn't taking Praluent due to prohibitive costs.  For exercise she has been walking routinely, but has struggled some due to the hot weather. She denies any palpitations, chest pain, shortness of breath, lightheadedness, syncope, orthopnea, or PND.  ROS:  Please see the history of present illness.  All other systems are reviewed and negative.  (+) Throbbing pain of occipital head/neck (+) LE edema  Studies Reviewed: [name]       Bilateral Carotid Dopplers  10/26/2022: Right Carotid: Velocities in the right ICA are  consistent with a 1-39% stenosis.  Left Carotid: Velocities in the left ICA are consistent with a 1-39% stenosis.  Vertebrals: Bilateral vertebral arteries demonstrate antegrade flow.  Subclavians: Normal flow hemodynamics were seen in bilateral subclavian arteries.   Risk Assessment/Calculations:     HYPERTENSION CONTROL Vitals:   12/28/22 0835 12/28/22 0838 12/28/22 0923  BP: (!) 179/80 (!) 176/64 (!) 176/72    The patient's blood pressure is elevated above target today.  In order to address the patient's elevated BP: Blood pressure will be monitored at home to determine if medication changes need to be made.          Physical Exam:    VS:  BP (!) 176/72 (BP Location: Right Arm, Patient Position: Sitting, Cuff Size: Normal)   Pulse (!) 59   Ht 5\' 2"  (1.575 m)   SpO2 97%   BMI 29.43 kg/m  , BMI Body mass index is 29.43 kg/m. GENERAL:  Well appearing HEENT: Pupils equal round and reactive, fundi not visualized, oral mucosa unremarkable NECK:  No jugular venous distention, waveform within normal limits, carotid upstroke brisk and symmetric, no bruits, no thyromegaly LUNGS:  Clear to auscultation bilaterally HEART:  RRR.  PMI not displaced or sustained,S1 and S2 within normal limits, no S3, no S4, no clicks, no rubs, no murmurs ABD:  Flat, positive bowel sounds normal in frequency in pitch, no bruits, no rebound, no guarding, no midline pulsatile mass, no hepatomegaly, no splenomegaly EXT:  2 plus pulses throughout, no edema, no cyanosis no clubbing SKIN:  No rashes no nodules NEURO:  Cranial nerves II through XII grossly intact, motor grossly intact throughout PSYCH:  Cognitively intact, oriented to person place and time  ASSESSMENT AND PLAN: .    # Hypertension: Elevated blood pressure in the office today. Patient reports intermittent neck pain, but no other symptoms suggestive of hypertensive urgency/emergency. Patient's home blood pressure monitor may need a new  battery. -Advise patient to have home blood pressure monitor checked at a local pharmacy. -Provide patient with blood pressure tracking sheets. -Continue current antihypertensive regimen (Carvedilol, Hydralazine, Imdur). -Recheck blood pressure in 1 month with pharmacist.  # CAD:  Non-obstructive. # Hyperlipidemia: Patient reports difficulty affording Praluent. -Consider switching to Inclisiran, a twice-yearly infusion, pending insurance approval. -Schedule appointment with pharmacist in 1 month to discuss this option and review blood pressure readings.  # HFpEF:  # Edema: Patient reports intermittent swelling, managed with as-needed Lasix. -Continue Lasix as needed. -BP control as above.  # Prior DVT:  Continue warfarin.  # Carotid stenosis: S/p L CEA.      Dispo: FU in 1 month with PharmD. FU with Tiffany C. Duke Salvia, MD, [facility] in 4 months.  I,Mathew Stumpf,acting as a Neurosurgeon for Chilton Si, MD.,have documented all relevant documentation on the behalf of Chilton Si, MD,as directed by  Chilton Si, MD while in the presence of Chilton Si, MD.  I, Tiffany C. Duke Salvia, MD have reviewed all documentation for this visit.  The documentation of the exam,  diagnosis, procedures, and orders on 12/28/2022 are all accurate and complete.   Signed, Chilton Si, MD

## 2022-12-28 NOTE — Patient Instructions (Signed)
Medication Instructions:  Your physician recommends that you continue on your current medications as directed. Please refer to the Current Medication list given to you today.  *If you need a refill on your cardiac medications before your next appointment, please call your pharmacy*  Follow-Up: At [facility], you and your health needs are our priority.  As part of our continuing mission to provide you with exceptional heart care, we have created designated Provider Care Teams.  These Care Teams include your primary Cardiologist (physician) and Advanced Practice Providers (APPs -  Physician Assistants and Nurse Practitioners) who all work together to provide you with the care you need, when you need it.  We recommend signing up for the patient portal called "MyChart".  Sign up information is provided on this After Visit Summary.  MyChart is used to connect with patients for Virtual Visits (Telemedicine).  Patients are able to view lab/test results, encounter notes, upcoming appointments, etc.  Non-urgent messages can be sent to your provider as well.   To learn more about what you can do with MyChart, go to [email or web address].    Your next appointment:   1 month with pharmd   &   4 months with Gillian Shields, NP

## 2022-12-29 DIAGNOSIS — N200 Calculus of kidney: Secondary | ICD-10-CM | POA: Diagnosis not present

## 2022-12-29 DIAGNOSIS — N132 Hydronephrosis with renal and ureteral calculous obstruction: Secondary | ICD-10-CM | POA: Diagnosis not present

## 2022-12-30 ENCOUNTER — Other Ambulatory Visit (HOSPITAL_COMMUNITY): Payer: Self-pay | Admitting: Adult Health

## 2022-12-30 DIAGNOSIS — N1330 Unspecified hydronephrosis: Secondary | ICD-10-CM

## 2023-01-02 DIAGNOSIS — E1165 Type 2 diabetes mellitus with hyperglycemia: Secondary | ICD-10-CM | POA: Diagnosis not present

## 2023-01-02 DIAGNOSIS — I10 Essential (primary) hypertension: Secondary | ICD-10-CM | POA: Diagnosis not present

## 2023-01-02 DIAGNOSIS — J449 Chronic obstructive pulmonary disease, unspecified: Secondary | ICD-10-CM | POA: Diagnosis not present

## 2023-01-02 DIAGNOSIS — E782 Mixed hyperlipidemia: Secondary | ICD-10-CM | POA: Diagnosis not present

## 2023-01-02 DIAGNOSIS — E1159 Type 2 diabetes mellitus with other circulatory complications: Secondary | ICD-10-CM | POA: Diagnosis not present

## 2023-01-11 ENCOUNTER — Ambulatory Visit (HOSPITAL_COMMUNITY)
Admission: RE | Admit: 2023-01-11 | Discharge: 2023-01-11 | Disposition: A | Discharge status: Home / Self Care | Payer: PPO | Source: Ambulatory Visit | Attending: Adult Health | Admitting: Adult Health

## 2023-01-11 DIAGNOSIS — N1330 Unspecified hydronephrosis: Secondary | ICD-10-CM | POA: Diagnosis not present

## 2023-01-11 DIAGNOSIS — N132 Hydronephrosis with renal and ureteral calculous obstruction: Secondary | ICD-10-CM | POA: Diagnosis not present

## 2023-01-11 MED ORDER — TECHNETIUM TC 99M MERTIATIDE
5.4000 | Freq: Once | INTRAVENOUS | Status: AC | PRN
  Administered 2023-01-11: 5.4 via INTRAVENOUS

## 2023-01-11 MED ORDER — FUROSEMIDE 10 MG/ML IJ SOLN
40.0000 mg | Freq: Once | INTRAMUSCULAR | Status: AC
  Administered 2023-01-11: 36.5 mg via INTRAVENOUS

## 2023-01-11 MED ORDER — FUROSEMIDE 10 MG/ML IJ SOLN
INTRAMUSCULAR | Status: AC
  Filled 2023-01-11: qty 4

## 2023-01-12 ENCOUNTER — Ambulatory Visit (HOSPITAL_BASED_OUTPATIENT_CLINIC_OR_DEPARTMENT_OTHER): Payer: PPO | Admitting: Pharmacist Clinician (PhC)/ Clinical Pharmacy Specialist

## 2023-01-12 ENCOUNTER — Encounter (HOSPITAL_BASED_OUTPATIENT_CLINIC_OR_DEPARTMENT_OTHER): Payer: Self-pay | Admitting: Pharmacist Clinician (PhC)/ Clinical Pharmacy Specialist

## 2023-01-12 VITALS — BP 121/68 | HR 66 | Ht 62.0 in | Wt 155.0 lb

## 2023-01-12 DIAGNOSIS — I10 Essential (primary) hypertension: Secondary | ICD-10-CM

## 2023-01-12 DIAGNOSIS — E7849 Other hyperlipidemia: Secondary | ICD-10-CM | POA: Diagnosis not present

## 2023-01-12 NOTE — Assessment & Plan Note (Signed)
Assessment: BP is controlled in office BP 121/68 mmHg;  Tolerates carvedilol and hydralazine well without any side effects Denies SOB, palpitation, chest pain, headaches,or swelling Reiterated the importance of regular exercise and low salt diet   Plan:  Continue taking current medications  Patient to keep record of BP readings with heart rate and report to [place] at the next visit Patient to follow up with Gillian Shields in October  Labs ordered today:  none

## 2023-01-12 NOTE — Assessment & Plan Note (Signed)
Assessment: Patient with ASCVD at LDL goal of < 70, when on Praluent Cannot afford Praluent pricing and stopped medication several months ago  Plan: Unfortunately with her insurance, inclisiran would have a similar cost Will remove Praluent from medication list at this time If [employer] grant is re-enrolling in the future, will reach out and see if we can get her a grant and resume medication.

## 2023-01-12 NOTE — Patient Instructions (Signed)
Follow up appointment: October 22 with Gillian Shields NP  Take your BP meds as follows: no changes to medication today  We will try to get you a grant from the [facility] to pay for the Praluent injections (for cholesterol).  It could be a few weeks out or several months.    Check your blood pressure at home daily (if able) and keep record of the readings.  Hypertension "High blood pressure"  Hypertension is often called "The Silent Killer." It rarely causes symptoms until it is extremely  high or has done damage to other organs in the body. For this reason, you should have your  blood pressure checked regularly by your physician. We will check your blood pressure  every time you see a provider at one of our offices.   Your blood pressure reading consists of two numbers. Ideally, blood pressure should be  below 120/80. The first ("top") number is called the systolic pressure. It measures the  pressure in your arteries as your heart beats. The second ("bottom") number is called the diastolic pressure. It measures the pressure in your arteries as the heart relaxes between beats.  The benefits of getting your blood pressure under control are enormous. A 10-point  reduction in systolic blood pressure can reduce your risk of stroke by 27% and heart failure by 28%  Your blood pressure goal is < 130/80  To check your pressure at home you will need to:  1. Sit up in a chair, with feet flat on the floor and back supported. Do not cross your ankles or legs. 2. Rest your left arm so that the cuff is about heart level. If the cuff goes on your upper arm,  then just relax the arm on the table, arm of the chair or your lap. If you have a wrist cuff, we  suggest relaxing your wrist against your chest (think of it as Pledging the Flag with the  wrong arm).  3. Place the cuff snugly around your arm, about 1 inch above the crook of your elbow. The  cords should be inside the groove of  your elbow.  4. Sit quietly, with the cuff in place, for about 5 minutes. After that 5 minutes press the power  button to start a reading. 5. Do not talk or move while the reading is taking place.  6. Record your readings on a sheet of paper. Although most cuffs have a memory, it is often  easier to see a pattern developing when the numbers are all in front of you.  7. You can repeat the reading after 1-3 minutes if it is recommended  Make sure your bladder is empty and you have not had caffeine or tobacco within the last 30 min  Always bring your blood pressure log with you to your appointments. If you have not brought your monitor in to be double checked for accuracy, please bring it to your next appointment.  You can find a list of quality blood pressure cuffs at validatebp.org

## 2023-01-12 NOTE — Progress Notes (Signed)
Office Visit    Patient Name: [name] Date of Encounter: 01/12/2023  Primary Care Provider:  [name], MD Primary Cardiologist:  Chilton Si, MD  Chief Complaint    Hypertension, Hyperlipidemia  Significant Past Medical History   ASCVD 50% mLAD stenosis; bilateral carotid 1-39% stenosis  HFpEF Intermittent edema, treats with prn furosemide  HLD 9/23 LDL 63 on Praluent (159 prior), cannot currently afford  Hx of DVT On chronic warfarin  DM2 5/22 A1c 7.5  - on glimepiride, dapagliflozin, (cannot afford Tradjenta)    Allergies  Allergen Reactions   Alphagan [Brimonidine] Itching   Diflunisal Swelling    Other reaction(s): ENTIRE BODY SWELLING   Vioxx [Rofecoxib] Shortness Of Breath   Metformin And Related     Kidney failure   Nexlizet [Bempedoic Acid-Ezetimibe]     Causes elevated Liver and Kidney function   Repatha [Evolocumab]     MYALGIAS   Codeine Rash   Elemental Sulfur Rash   Motrin [Ibuprofen] Rash   Penicillins Rash   Pravastatin Rash    History of Present Illness    [name] is a 82 y.o. female patient of Dr Duke Salvia, in the office today for hypertension evaluation.  She was seen by Dr. Duke Salvia just a few weeks ago, at which time her in office BP was noted to be 176/72.   She reported home readings usually in the 120-130 systolic range, so she was asked to monitor at home and come back in 2-3 weeks for follow up.  She has also had to stop taking her Praluent cholesterol injections, as they have become cost prohibitive.  Dr. Duke Salvia wondered if inclisiran might be a better option for her.    Today she reports that home readings continue to be good, nothing over 130 systolic on her cuff.  She has not had any issues with her current medications for BP.    Blood Pressure Goal:  130/80  Current Medications: carvedilol 6.25 mg bid, hydralazine 25 mg bid   Family Hx:   children healthy, 1 deceased 2 living - spina bifida  Social Hx:       Tobacco: no  Alcohol: no  Caffeine: decaf only  Diet:    goes to [employer] biscuit with honey for breakfast; eats out some for lunch and dinner, otherwise mostly vegetables, not much meat at home - lives alone; no added salt  Exercise: household chores  Home BP readings:  all < 130 at home on wrist cuff.       Accessory Clinical Findings    Lab Results  Component Value Date   CREATININE 0.96 11/15/2022   BUN 15 11/15/2022   NA 141 11/15/2022   K 3.8 11/15/2022   CL 105 11/15/2022   CO2 27 11/15/2022   Lab Results  Component Value Date   ALT 19 11/15/2022   AST 18 11/15/2022   ALKPHOS 87 11/15/2022   BILITOT 0.5 11/15/2022   Lab Results  Component Value Date   HGBA1C 7.5 (H) 11/25/2020    Home Medications    Current Outpatient Medications  Medication Sig Dispense Refill   albuterol (PROVENTIL HFA;VENTOLIN HFA) 108 (90 BASE) MCG/ACT inhaler Inhale 2 puffs into the lungs every 4 (four) hours as needed for shortness of breath. 1 Inhaler 0   albuterol (PROVENTIL) (2.5 MG/3ML) 0.083% nebulizer solution Take 2.5 mg by nebulization every 6 (six) hours as needed for wheezing or shortness of breath.     amitriptyline (ELAVIL) 25 MG  tablet Take 25 mg by mouth at bedtime.     anastrozole (ARIMIDEX) 1 MG tablet Take 1 tablet (1 mg total) by mouth at bedtime. 90 tablet 3   carvedilol (COREG) 6.25 MG tablet Take 1 tablet (6.25 mg total) by mouth 2 (two) times daily. 180 tablet 3   cetirizine (ZYRTEC) 10 MG tablet Take 10 mg by mouth daily.     Cholecalciferol (VITAMIN D-3) 1000 units CAPS Take 1,000 Units by mouth daily.     diazepam (VALIUM) 2 MG tablet Take 2 mg by mouth 2 (two) times daily as needed for anxiety (prior to eye injections).     dorzolamide-timolol (COSOPT) 22.3-6.8 MG/ML ophthalmic solution INSTILL 1 DROP INTO RIGHT EYE TWICE A DAY (Patient taking differently: Place 1 drop into the right eye 2 (two) times daily.) 30 mL 2   esomeprazole (NEXIUM) 20 MG capsule Take  20 mg by mouth daily at 12 noon.     FARXIGA 5 MG TABS tablet Take 5 mg by mouth every morning.     fluticasone (FLOVENT HFA) 110 MCG/ACT inhaler Inhale 1 puff into the lungs 2 (two) times daily. Rinse mouth with water after each use 1 each 0   furosemide (LASIX) 40 MG tablet TAKE 1 TABLET (40 MG TOTAL) BY MOUTH DAILY. MAY TAKE EXTRA DAILY AS NEEDED FOR SWELLING (Patient taking differently: Take 40 mg by mouth daily.) 180 tablet 0   gabapentin (NEURONTIN) 300 MG capsule Take 300 mg by mouth 3 (three) times daily as needed (pain).     glimepiride (AMARYL) 2 MG tablet Take 2 mg by mouth daily.  2   hydrALAZINE (APRESOLINE) 25 MG tablet TAKE ONE TABLET BY MOUTH AT BREAKFAST AND AT BEDTIME 180 tablet 1   isosorbide mononitrate (IMDUR) 30 MG 24 hr tablet TAKE ONE TABLET BY MOUTH ONCE DAILY 90 tablet 3   latanoprost (XALATAN) 0.005 % ophthalmic solution Place 1 drop into the left eye at bedtime.     meclizine (ANTIVERT) 25 MG tablet Take 25 mg by mouth 2 (two) times daily as needed for dizziness.     Multiple Vitamins-Minerals (PRESERVISION AREDS 2 PO) Take 1 capsule by mouth in the morning and at bedtime.     NON FORMULARY CBD Gummy     Omega-3 Fatty Acids (FISH OIL) 1200 MG CAPS Take 1,200 mg by mouth 2 (two) times daily.     ondansetron (ZOFRAN-ODT) 4 MG disintegrating tablet Take 1 tablet (4 mg total) by mouth every 8 (eight) hours as needed. 20 tablet 0   Polyethyl Glycol-Propyl Glycol (SYSTANE OP) Place 1 drop into the left eye daily as needed (dry eye).     polyethylene glycol (MIRALAX / GLYCOLAX) packet Take 17 g by mouth daily as needed for moderate constipation.     potassium chloride SA (KLOR-CON M) 20 MEQ tablet TAKE ONE-HALF TABLET BY  MOUTH DAILY (Patient taking differently: Take 10 mEq by mouth See admin instructions. Only takes when taking) 45 tablet 2   promethazine-dextromethorphan (PROMETHAZINE-DM) 6.25-15 MG/5ML syrup Take 5 mLs by mouth 4 (four) times daily as needed. 100 mL 0    tamsulosin (FLOMAX) 0.4 MG CAPS capsule Take 1 capsule (0.4 mg total) by mouth daily. 14 capsule 0   vitamin B-12 (CYANOCOBALAMIN) 100 MCG tablet Take 100 mcg by mouth daily.     warfarin (COUMADIN) 2 MG tablet TAKE ONE TABLET BY MOUTH EVERYDAY AT BEDTIME AS DIRECTED by THE coumadin Clinic (Patient taking differently: Take 2 mg by mouth daily at  4 PM.) 40 tablet 3   No current facility-administered medications for this visit.        Assessment & Plan    HTN (hypertension) Assessment: BP is controlled in office BP 121/68 mmHg;  Tolerates carvedilol and hydralazine well without any side effects Denies SOB, palpitation, chest pain, headaches,or swelling Reiterated the importance of regular exercise and low salt diet   Plan:  Continue taking current medications  Patient to keep record of BP readings with heart rate and report to [place] at the next visit Patient to follow up with Gillian Shields in October  Labs ordered today:  none   Hyperlipidemia Assessment: Patient with ASCVD at LDL goal of < 70, when on Praluent Cannot afford Praluent pricing and stopped medication several months ago  Plan: Unfortunately with her insurance, inclisiran would have a similar cost Will remove Praluent from medication list at this time If [employer] grant is re-enrolling in the future, will reach out and see if we can get her a grant and resume medication.    Phillips Hay PharmD CPP [facility]  [address] [place], [place] [address] [phone]

## 2023-01-13 ENCOUNTER — Ambulatory Visit: Payer: PPO | Attending: Cardiology | Admitting: *Deleted

## 2023-01-13 DIAGNOSIS — Z5181 Encounter for therapeutic drug level monitoring: Secondary | ICD-10-CM | POA: Diagnosis not present

## 2023-01-13 DIAGNOSIS — I82A12 Acute embolism and thrombosis of left axillary vein: Secondary | ICD-10-CM | POA: Diagnosis not present

## 2023-01-13 LAB — POCT INR: INR: 2.6 (ref 2.0–3.0)

## 2023-01-13 NOTE — Patient Instructions (Signed)
Continue warfarin 1 tablet daily   Recheck INR in 4 wk.  Call Coumadin clinic for any questions or changes in medications.  

## 2023-01-27 DIAGNOSIS — N130 Hydronephrosis with ureteropelvic junction obstruction: Secondary | ICD-10-CM | POA: Diagnosis not present

## 2023-01-27 DIAGNOSIS — N201 Calculus of ureter: Secondary | ICD-10-CM | POA: Diagnosis not present

## 2023-02-01 DIAGNOSIS — H353211 Exudative age-related macular degeneration, right eye, with active choroidal neovascularization: Secondary | ICD-10-CM | POA: Diagnosis not present

## 2023-02-01 DIAGNOSIS — H40022 Open angle with borderline findings, high risk, left eye: Secondary | ICD-10-CM | POA: Diagnosis not present

## 2023-02-01 DIAGNOSIS — Z961 Presence of intraocular lens: Secondary | ICD-10-CM | POA: Diagnosis not present

## 2023-02-01 DIAGNOSIS — H353122 Nonexudative age-related macular degeneration, left eye, intermediate dry stage: Secondary | ICD-10-CM | POA: Diagnosis not present

## 2023-02-01 DIAGNOSIS — E119 Type 2 diabetes mellitus without complications: Secondary | ICD-10-CM | POA: Diagnosis not present

## 2023-02-01 DIAGNOSIS — H401113 Primary open-angle glaucoma, right eye, severe stage: Secondary | ICD-10-CM | POA: Diagnosis not present

## 2023-02-03 ENCOUNTER — Other Ambulatory Visit: Payer: Self-pay | Admitting: Cardiovascular Disease

## 2023-02-03 NOTE — Telephone Encounter (Signed)
Rx request sent to pharmacy.  

## 2023-02-09 NOTE — Progress Notes (Signed)
[facility] - Clinic Note  02/17/2023     CHIEF COMPLAINT Patient presents for Retina Follow Up  HISTORY OF PRESENT ILLNESS: [name] is a 82 y.o. female who presents to the clinic today for:  HPI     Retina Follow Up   Patient presents with  Wet AMD.  In right eye.  Severity is moderate.  Duration of 8 weeks.  Since onset it is stable.  I, the attending physician,  performed the HPI with the patient and updated documentation appropriately.        Comments   Patient states vision the same OU. Ran out of cosopt for the past week. Uses bid OD. Using latanoprost at bedtime OU. Was using latanoprost at bedtime OS, but started OD after running out of cosopt.      Last edited by Rennis Chris, MD on 02/17/2023  9:59 AM.    Patient states she ran out of Cosopt last week  Referring physician: [name], MD [address] [place],  [place] [address]  HISTORICAL INFORMATION:  Selected notes from the MEDICAL RECORD NUMBER Referred by Dr. Daisy Lazar for concern of SRF OD LEE: 11.27.20 (M. Cotter) [BCVA: OD: 20/80-- OS: 20/60-]  Ocular Hx-glaucoma (latanoprost)  PMH-DM    CURRENT MEDICATIONS: Current Outpatient Medications (Ophthalmic Drugs)  Medication Sig   dorzolamide-timolol (COSOPT) 22.3-6.8 MG/ML ophthalmic solution INSTILL 1 DROP INTO RIGHT EYE TWICE A DAY (Patient taking differently: Place 1 drop into the right eye 2 (two) times daily.)   latanoprost (XALATAN) 0.005 % ophthalmic solution Place 1 drop into the left eye at bedtime.   Polyethyl Glycol-Propyl Glycol (SYSTANE OP) Place 1 drop into the left eye daily as needed (dry eye).   No current facility-administered medications for this visit. (Ophthalmic Drugs)   Current Outpatient Medications (Other)  Medication Sig   albuterol (PROVENTIL HFA;VENTOLIN HFA) 108 (90 BASE) MCG/ACT inhaler Inhale 2 puffs into the lungs every 4 (four) hours as needed for shortness of breath.   albuterol  (PROVENTIL) (2.5 MG/3ML) 0.083% nebulizer solution Take 2.5 mg by nebulization every 6 (six) hours as needed for wheezing or shortness of breath.   amitriptyline (ELAVIL) 25 MG tablet Take 25 mg by mouth at bedtime.   anastrozole (ARIMIDEX) 1 MG tablet Take 1 tablet (1 mg total) by mouth at bedtime.   carvedilol (COREG) 6.25 MG tablet Take 1 tablet (6.25 mg total) by mouth 2 (two) times daily.   cetirizine (ZYRTEC) 10 MG tablet Take 10 mg by mouth daily.   Cholecalciferol (VITAMIN D-3) 1000 units CAPS Take 1,000 Units by mouth daily.   diazepam (VALIUM) 2 MG tablet Take 2 mg by mouth 2 (two) times daily as needed for anxiety (prior to eye injections).   esomeprazole (NEXIUM) 20 MG capsule Take 20 mg by mouth daily at 12 noon.   FARXIGA 5 MG TABS tablet Take 5 mg by mouth every morning.   fluticasone (FLOVENT HFA) 110 MCG/ACT inhaler Inhale 1 puff into the lungs 2 (two) times daily. Rinse mouth with water after each use   furosemide (LASIX) 40 MG tablet TAKE 1 TABLET (40 MG TOTAL) BY MOUTH DAILY. MAY TAKE EXTRA DAILY AS NEEDED FOR SWELLING (Patient taking differently: Take 40 mg by mouth daily.)   gabapentin (NEURONTIN) 300 MG capsule Take 300 mg by mouth 3 (three) times daily as needed (pain).   glimepiride (AMARYL) 2 MG tablet Take 2 mg by mouth daily.   hydrALAZINE (APRESOLINE) 25 MG tablet  TAKE ONE TABLET BY MOUTH AT BREAKFAST AND AT BEDTIME   isosorbide mononitrate (IMDUR) 30 MG 24 hr tablet TAKE ONE TABLET BY MOUTH ONCE DAILY   meclizine (ANTIVERT) 25 MG tablet Take 25 mg by mouth 2 (two) times daily as needed for dizziness.   Multiple Vitamins-Minerals (PRESERVISION AREDS 2 PO) Take 1 capsule by mouth in the morning and at bedtime.   NON FORMULARY CBD Gummy   Omega-3 Fatty Acids (FISH OIL) 1200 MG CAPS Take 1,200 mg by mouth 2 (two) times daily.   ondansetron (ZOFRAN-ODT) 4 MG disintegrating tablet Take 1 tablet (4 mg total) by mouth every 8 (eight) hours as needed.   polyethylene glycol  (MIRALAX / GLYCOLAX) packet Take 17 g by mouth daily as needed for moderate constipation.   potassium chloride SA (KLOR-CON M) 20 MEQ tablet TAKE ONE-HALF TABLET BY  MOUTH DAILY (Patient taking differently: Take 10 mEq by mouth See admin instructions. Only takes when taking)   promethazine-dextromethorphan (PROMETHAZINE-DM) 6.25-15 MG/5ML syrup Take 5 mLs by mouth 4 (four) times daily as needed.   tamsulosin (FLOMAX) 0.4 MG CAPS capsule Take 1 capsule (0.4 mg total) by mouth daily.   vitamin B-12 (CYANOCOBALAMIN) 100 MCG tablet Take 100 mcg by mouth daily.   warfarin (COUMADIN) 2 MG tablet TAKE ONE TABLET BY MOUTH EVERYDAY AT BEDTIME AS DIRECTED by THE coumadin Clinic (Patient taking differently: Take 2 mg by mouth daily at 4 PM.)   No current facility-administered medications for this visit. (Other)   REVIEW OF SYSTEMS: ROS   Positive for: Genitourinary, Endocrine, Cardiovascular, Eyes Negative for: Constitutional, Gastrointestinal, Neurological, Skin, Musculoskeletal, HENT, Respiratory, Psychiatric, Allergic/Imm, Heme/Lymph Last edited by Doreene Nest, COT on 02/17/2023  7:34 AM.     ALLERGIES Allergies  Allergen Reactions   Alphagan [Brimonidine] Itching   Diflunisal Swelling    Other reaction(s): ENTIRE BODY SWELLING   Vioxx [Rofecoxib] Shortness Of Breath   Metformin And Related     Kidney failure   Nexlizet [Bempedoic Acid-Ezetimibe]     Causes elevated Liver and Kidney function   Repatha [Evolocumab]     MYALGIAS   Codeine Rash   Elemental Sulfur Rash   Motrin [Ibuprofen] Rash   Penicillins Rash   Pravastatin Rash   PAST MEDICAL HISTORY Past Medical History:  Diagnosis Date   Antral gastritis    EGD 11/15   Arthritis    Asthmatic bronchitis    Back pain    Breast cancer (HCC)    right breast   CAD in native artery 03/10/2021   Chronic diastolic heart failure (HCC) 05/20/2015   Grade 2 diastolic dysfunction.  04/2015.   Chronic kidney disease    kidney  function low   COPD (chronic obstructive pulmonary disease) (HCC)    Diabetes mellitus    x 5 yrs   DVT of axillary vein, acute left (HCC) 07/24/2012   GERD (gastroesophageal reflux disease)    Glaucoma    POAG OU   Heart murmur    rheum fever at age 30   History of hiatal hernia    History of kidney stones    Hyperlipidemia 05/20/2015   Hypertension    Hypertensive retinopathy    OU   Hypothyroidism    Kidney stones    Macular degeneration    Wet OD, Dry OS   Mixed hyperlipidemia    OSA (obstructive sleep apnea) 12/17/2021   does not use cpap on regular basis   Peripheral venous insufficiency    Pinched  nerve    right elbow   Pneumonia    PONV (postoperative nausea and vomiting)    Sigmoid diverticulitis    Snoring 03/10/2021   Vertigo    chonic   Past Surgical History:  Procedure Laterality Date   ABDOMINAL HYSTERECTOMY     BACK SURGERY     spinal    BREAST LUMPECTOMY WITH RADIOACTIVE SEED LOCALIZATION Right 12/03/2020   Procedure: RIGHT BREAST LUMPECTOMY WITH RADIOACTIVE SEED LOCALIZATION;  Surgeon: Manus Rudd, MD;  Location: [facility] OR;  Service: General;  Laterality: Right;   CARDIAC CATHETERIZATION N/A 05/26/2015   Procedure: Left Heart Cath and Coronary Angiography;  Surgeon: Corky Crafts, MD;  Location: [facility] INVASIVE CV LAB;  Service: Cardiovascular;  Laterality: N/A;   CATARACT EXTRACTION Bilateral    CHOLECYSTECTOMY     COLON SURGERY     COLONOSCOPY N/A 12/27/2013   Procedure: COLONOSCOPY;  Surgeon: Malissa Hippo, MD;  Location: [facility];  Service: Endoscopy;  Laterality: N/A;  200   COLOSTOMY CLOSURE     CYSTOSCOPY/URETEROSCOPY/HOLMIUM LASER/STENT PLACEMENT Right 11/24/2022   Procedure: CYSTOSCOPY RIGHT URETEROSCOPY/HOLMIUM LASER/STENT PLACEMENT;  Surgeon: Crista Elliot, MD;  Location: [facility] ORS;  Service: Urology;  Laterality: Right;  60 MINS FOR CASE   ENDARTERECTOMY Left 03/10/2022   Procedure: LEFT CAROTID ENDARTERECTOMY;  Surgeon: Leonie Douglas, MD;  Location: [facility] OR;  Service: Vascular;  Laterality: Left;   ESOPHAGOGASTRODUODENOSCOPY N/A 05/07/2014   Procedure: ESOPHAGOGASTRODUODENOSCOPY (EGD);  Surgeon: Malissa Hippo, MD;  Location: [facility];  Service: Endoscopy;  Laterality: N/A;   EYE SURGERY Bilateral    Cat Sx   fracture left foot     HERNIA REPAIR     NM MYOCAR PERF WALL MOTION  01/28/2009   Normal   OTHER SURGICAL HISTORY     colostomy, colostomy reversal, for diverticulitis surgical hernia repair, arm surgery, neck surgery   PATCH ANGIOPLASTY Left 03/10/2022   Procedure: PATCH ANGIOPLASTY WITH 1X6CM Kathleen Lime;  Surgeon: Leonie Douglas, MD;  Location: [facility] OR;  Service: Vascular;  Laterality: Left;   US ECHOCARDIOGRAPHY  02/11/2010   Mild MR,trace TR & AI   FAMILY HISTORY Family History  Problem Relation Age of Onset   CVA Maternal Grandmother 74       deceased   Heart disease Maternal Grandmother    Stroke Maternal Grandmother    Breast cancer Maternal Grandmother    Other Mother 40       Cause unknown   Cancer Mother        liver   Heart attack Brother 51       deceased   Breast cancer Sister    Leukemia Maternal Aunt    Glaucoma Maternal Uncle    Bone cancer Maternal Uncle    Spina bifida Daughter    SOCIAL HISTORY Social History   Tobacco Use   Smoking status: Former    Types: Cigarettes    Passive exposure: Never   Smokeless tobacco: Never   Tobacco comments:    Smoke 1-1 1/2 packs a day  Vaping Use   Vaping status: Never Used  Substance Use Topics   Alcohol use: No   Drug use: No       OPHTHALMIC EXAM: Base Eye Exam     Visual Acuity (Snellen - Linear)       Right Left   Dist cc 20/40 -1 20/30 +1   Dist ph cc 20/30 -2 20/25  Tonometry (Tonopen, 7:45 AM)       Right Left   Pressure 10 15         Pupils       Dark Light Shape React APD   Right 2 1 Round Brisk None   Left 2 1 Round Brisk None         Visual Fields (Counting fingers)        Left Right    Full Full         Extraocular Movement       Right Left    Full, Ortho Full, Ortho         Neuro/Psych     Oriented x3: Yes   Mood/Affect: Normal         Dilation     Both eyes: 1.0% Mydriacyl, 2.5% Phenylephrine @ 7:45 AM           Slit Lamp and Fundus Exam     Slit Lamp Exam       Right Left   Lids/Lashes Dermatochalasis - upper lid, mild Meibomian gland dysfunction Dermatochalasis - upper lid, Telangiectasia, mild Meibomian gland dysfunction   Conjunctiva/Sclera White and quiet, superior bleb White and quiet   Cornea 1+Punctate epethelial erosions, well healed cataract wound 1+ fine Punctate epithelial erosions, arcus, mild EBMD, well healed cataract wound   Anterior Chamber narrow temporal angle, tube at 1200 Deep and quiet, narrow temporal angle   Iris round and mod dilated Round and moderately dilated to 5.70mm   Lens Posterior chamber intraocular lens, open PC Posterior chamber intraocular lens, trace Posterior capsular opacification   Anterior Vitreous Vitreous syneresis, Posterior vitreous detachment Vitreous syneresis         Fundus Exam       Right Left   Disc Sharp rim, +Pallor, +cupping w/ superior and inferior rim thinning, temporal Peripapillary atrophy mild Pallor, Sharp rim, temporal Peripapillary atrophy   C/D Ratio 0.9 0.5   Macula Flat, Blunted foveal reflex, +focal CNV/PED nasal macula with partial pigment ring, trace IRF/ SRF -- improved, Drusen, RPE mottling and clumping, no heme Flat, Blunted foveal reflex, fine drusen, mild ERM, Retinal pigment epithelial mottling and clumping, No heme or edema   Vessels attenuated, Tortuous attenuated, Tortuous   Periphery Attached, mild reticular degeneration, No heme, No RT/RD Attached; no heme           IMAGING AND PROCEDURES  Imaging and Procedures for @TODAY @  OCT, Retina - OU - Both Eyes       Right Eye Quality was good. Central Foveal Thickness: 196. Progression has  been stable. Findings include normal foveal contour, retinal drusen , outer retinal tubulation, subretinal hyper-reflective material, intraretinal hyper-reflective material, intraretinal fluid, pigment epithelial detachment, subretinal fluid, outer retinal atrophy (Stable improvement in focal IRF/SRF overlying nasal PED / CNV, ORT improved, partial PVD).   Left Eye Quality was good. Central Foveal Thickness: 206. Progression has been stable. Findings include normal foveal contour, no IRF, no SRF, retinal drusen (Partial PVD, patchy ORA).   Notes *Images captured and stored on drive  Diagnosis / Impression:  OD: exudative ARMD -- Stable improvement in focal IRF/SRF overlying nasal PED / CNV; ORT improved; partial PVD OS: NFP, no IRF/SRF; +drusen -- nonexudative ARMD  Clinical management:  See below  Abbreviations: NFP - Normal foveal profile. CME - cystoid macular edema. PED - pigment epithelial detachment. IRF - intraretinal fluid. SRF - subretinal fluid. EZ - ellipsoid zone. ERM - epiretinal membrane. ORA - outer retinal  atrophy. ORT - outer retinal tubulation. SRHM - subretinal hyper-reflective material      Intravitreal Injection, Pharmacologic Agent - OD - Right Eye       Time Out 02/17/2023. 8:18 AM. Confirmed correct patient, procedure, site, and patient consented.   Anesthesia Topical anesthesia was used. Anesthetic medications included Lidocaine 2%, Proparacaine 0.5%.   Procedure Preparation included 5% betadine to ocular surface, eyelid speculum. A (32g) needle was used.   Injection: 1.25 mg Bevacizumab 1.25mg /0.64ml   Route: Intravitreal, Site: Right Eye   NDC: [id number], Lot: [id number], Expiration date: 10/26/2023   Post-op Post injection exam found visual acuity of at least counting fingers. The patient tolerated the procedure well. There were no complications. The patient received written and verbal post procedure care education. Post injection medications were not  given.            ASSESSMENT/PLAN:   ICD-10-CM   1. Exudative age-related macular degeneration of right eye with active choroidal neovascularization (HCC)  H35.3211 OCT, Retina - OU - Both Eyes    Intravitreal Injection, Pharmacologic Agent - OD - Right Eye    Bevacizumab (AVASTIN) SOLN 1.25 mg    2. Intermediate stage nonexudative age-related macular degeneration of left eye  H35.3122     3. Diabetes mellitus type 2 without retinopathy (HCC)  E11.9     4. Long term (current) use of oral hypoglycemic drugs  Z79.84     5. Essential hypertension  I10     6. Hypertensive retinopathy of both eyes  H35.033     7. Pseudophakia of both eyes  Z96.1     8. Primary open angle glaucoma of both eyes, unspecified glaucoma stage  H40.1130     9. PCO (posterior capsular opacification), right  H26.491      1. 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, OD  - h/o delayed f/u from 8 wks to 12 on 6.20.22 due to lumpectomy -- dx'd w/ DCIS - h/o delayed follow up from 4 weeks to 8 weeks due to passing of husband (12.11.20-02.12.21)  - s/p IVA OD #1 (12.11.20), #2 (02.12.21), #3 (03.12.21), #4 (04.09.21), #5 (05.14.21), #6 (06.17.21), #7 (07.23.21), #8 (10.15.21), #9 (12.7.21), #10 (03.22.22), #11 (06.20.22), #12 (08.24.22), #13 (10.31.22), #14 (01.03.23), #15 (08.15.23), #16 (11.15.23), #17 (02.29.24), #18 (03.29.24), #19 (05.09.24), #20 (06.20.24)  - FA 12.11.20 confirms +CNVM  **history of increased fluid at 7+ months noted on 08.15.23** - OCT today shows stable improvement in focal IRF/SRF overlying nasal PED / CNV, ORT improved at 8 weeks  - BCVA OD improved to 20/30 from 20/40  - recommend IVA OD #21 today, 08.15.24 with follow up at 8 weeks again  - pt wishes to proceed with injection  - RBA of procedure discussed, questions answered - informed consent obtained and signed - see procedure note  - Avastin informed consent form re-signed and scanned on 08.15.2023 (OD)             - Continue  to use AT OD  - f/u 8 weeks -- DFE/OCT/possible injection  2. Age related macular degeneration, non-exudative, OS  - intermediate stage  - BCVA 20/25 - The incidence, anatomy, and pathology of dry AMD, risk of progression, and the AREDS and AREDS 2 study including smoking risks discussed with patient.   - recommend Amsler grid monitoring  3,4. Diabetes mellitus, type 2 without retinopathy - The incidence, risk factors for progression, natural history and treatment options for diabetic retinopathy  were discussed with patient.   -  The need for close monitoring of blood glucose, blood pressure, and serum lipids, avoiding cigarette or any type of tobacco, and the need for long term follow up was also discussed with patient.  - monitor   5,6. Hypertensive retinopathy OU  - discussed importance of tight BP control  - monitor   7. Pseudophakia OU, PCO OD  - s/p CE/IOL OU (Dr. Harlon Flor)  - IOL in good position  - s/p yag cap OD (04.06.22) -- good PC opening  - monitor   8. POAG OU  - formerly managed by Dr. Harlon Flor -- now following at [facility]  - s/p laser w/ Dr. Harlon Flor -- ?SLT  - s/p trab OD w/ Dr. Zetta Bills  - IOP 10,15  - ran out of Cosopt last week -- okay to stay off for now -- will restart if IOP increases at next visit  - currently on Latanoprost QHS  Ophthalmic Meds Ordered this visit:  Meds ordered this encounter  Medications   Bevacizumab (AVASTIN) SOLN 1.25 mg     Return in about 8 weeks (around 04/14/2023) for f/u exu ARMD OD, DFE, OCT.  There are no Patient Instructions on file for this visit.  This document serves as a record of services personally performed by Karie Chimera, MD, PhD. It was created on their behalf by Glee Arvin. Manson Passey, OA an ophthalmic technician. The creation of this record is the provider's dictation and/or activities during the visit.    Electronically signed by: Glee Arvin. Manson Passey, OA 02/17/23 10:01 AM  Karie Chimera, M.D.,  Ph.D. Diseases & Surgery of the Retina and Vitreous [facility]  I have reviewed the above documentation for accuracy and completeness, and I agree with the above. Karie Chimera, M.D., Ph.D. 02/17/23 10:04 AM   Abbreviations: M myopia (nearsighted); A astigmatism; H hyperopia (farsighted); P presbyopia; Mrx spectacle prescription;  CTL contact lenses; OD right eye; OS left eye; OU both eyes  XT exotropia; ET esotropia; PEK punctate epithelial keratitis; PEE punctate epithelial erosions; DES dry eye syndrome; MGD meibomian gland dysfunction; ATs artificial tears; PFAT's preservative free artificial tears; NSC nuclear sclerotic cataract; PSC posterior subcapsular cataract; ERM epi-retinal membrane; PVD posterior vitreous detachment; RD retinal detachment; DM diabetes mellitus; DR diabetic retinopathy; NPDR non-proliferative diabetic retinopathy; PDR proliferative diabetic retinopathy; CSME clinically significant macular edema; DME diabetic macular edema; dbh dot blot hemorrhages; CWS cotton wool spot; POAG primary open angle glaucoma; C/D cup-to-disc ratio; HVF humphrey visual field; GVF goldmann visual field; OCT optical coherence tomography; IOP intraocular pressure; BRVO Branch retinal vein occlusion; CRVO central retinal vein occlusion; CRAO central retinal artery occlusion; BRAO branch retinal artery occlusion; RT retinal tear; SB scleral buckle; PPV pars plana vitrectomy; VH Vitreous hemorrhage; PRP panretinal laser photocoagulation; IVK intravitreal kenalog; VMT vitreomacular traction; MH Macular hole;  NVD neovascularization of the disc; NVE neovascularization elsewhere; AREDS age related eye disease study; ARMD age related macular degeneration; POAG primary open angle glaucoma; EBMD epithelial/anterior basement membrane dystrophy; ACIOL anterior chamber intraocular lens; IOL intraocular lens; PCIOL posterior chamber intraocular lens; Phaco/IOL phacoemulsification with  intraocular lens placement; PRK photorefractive keratectomy; LASIK laser assisted in situ keratomileusis; HTN hypertension; DM diabetes mellitus; COPD chronic obstructive pulmonary disease

## 2023-02-10 ENCOUNTER — Ambulatory Visit: Payer: PPO | Attending: Cardiology | Admitting: *Deleted

## 2023-02-10 DIAGNOSIS — I82A12 Acute embolism and thrombosis of left axillary vein: Secondary | ICD-10-CM

## 2023-02-10 DIAGNOSIS — Z5181 Encounter for therapeutic drug level monitoring: Secondary | ICD-10-CM

## 2023-02-10 LAB — POCT INR: INR: 2.1 (ref 2.0–3.0)

## 2023-02-10 NOTE — Patient Instructions (Signed)
Continue warfarin 1 tablet daily   Recheck INR in 6 wk.  Call Coumadin clinic for any questions or changes in medications.

## 2023-02-17 ENCOUNTER — Ambulatory Visit (INDEPENDENT_AMBULATORY_CARE_PROVIDER_SITE_OTHER): Payer: PPO | Admitting: Ophthalmology

## 2023-02-17 ENCOUNTER — Encounter (INDEPENDENT_AMBULATORY_CARE_PROVIDER_SITE_OTHER): Payer: Self-pay | Admitting: Ophthalmology

## 2023-02-17 DIAGNOSIS — E119 Type 2 diabetes mellitus without complications: Secondary | ICD-10-CM

## 2023-02-17 DIAGNOSIS — H35033 Hypertensive retinopathy, bilateral: Secondary | ICD-10-CM

## 2023-02-17 DIAGNOSIS — Z961 Presence of intraocular lens: Secondary | ICD-10-CM | POA: Diagnosis not present

## 2023-02-17 DIAGNOSIS — H353122 Nonexudative age-related macular degeneration, left eye, intermediate dry stage: Secondary | ICD-10-CM | POA: Diagnosis not present

## 2023-02-17 DIAGNOSIS — I10 Essential (primary) hypertension: Secondary | ICD-10-CM | POA: Diagnosis not present

## 2023-02-17 DIAGNOSIS — H401130 Primary open-angle glaucoma, bilateral, stage unspecified: Secondary | ICD-10-CM

## 2023-02-17 DIAGNOSIS — H26491 Other secondary cataract, right eye: Secondary | ICD-10-CM | POA: Diagnosis not present

## 2023-02-17 DIAGNOSIS — Z7984 Long term (current) use of oral hypoglycemic drugs: Secondary | ICD-10-CM

## 2023-02-17 DIAGNOSIS — H353211 Exudative age-related macular degeneration, right eye, with active choroidal neovascularization: Secondary | ICD-10-CM

## 2023-02-17 MED ORDER — BEVACIZUMAB CHEMO INJECTION 1.25MG/0.05ML SYRINGE FOR KALEIDOSCOPE
1.2500 mg | INTRAVITREAL | Status: AC | PRN
Start: 2023-02-17 — End: 2023-02-17
  Administered 2023-02-17: 1.25 mg via INTRAVITREAL

## 2023-02-23 ENCOUNTER — Other Ambulatory Visit: Payer: Self-pay | Admitting: Cardiovascular Disease

## 2023-02-27 DIAGNOSIS — E663 Overweight: Secondary | ICD-10-CM | POA: Diagnosis not present

## 2023-02-27 DIAGNOSIS — R051 Acute cough: Secondary | ICD-10-CM | POA: Diagnosis not present

## 2023-02-27 DIAGNOSIS — Z6828 Body mass index (BMI) 28.0-28.9, adult: Secondary | ICD-10-CM | POA: Diagnosis not present

## 2023-02-27 DIAGNOSIS — U071 COVID-19: Secondary | ICD-10-CM | POA: Diagnosis not present

## 2023-03-09 ENCOUNTER — Other Ambulatory Visit (HOSPITAL_COMMUNITY): Payer: Self-pay

## 2023-03-09 ENCOUNTER — Other Ambulatory Visit: Payer: Self-pay | Admitting: Pharmacist Clinician (PhC)/ Clinical Pharmacy Specialist

## 2023-03-09 MED ORDER — PRALUENT 75 MG/ML ~~LOC~~ SOAJ
75.0000 mg | SUBCUTANEOUS | 2 refills | Status: DC
  Filled 2023-03-09 (×2): qty 2, 28d supply, fill #0

## 2023-03-09 NOTE — Telephone Encounter (Signed)
Pharmacy Patient Advocate Encounter   Received notification from Physician's Office that prior authorization for PRALUENT is required/requested.   Per test claim: The current 28 day co-pay is, $121.47.  No PA needed at this time. This test claim was processed through [facility]- copay amounts may vary at other pharmacies due to pharmacy/plan contracts, or as the patient moves through the different stages of their insurance plan.

## 2023-03-09 NOTE — Telephone Encounter (Signed)
[employer] grant - hypercholesterolemia  ID [id number] BIN  [id number] PCN   [id number] GRP  [id number]  Grant good until 02/06/24  Technicians - please update PA for Praluent 75 mg

## 2023-03-11 ENCOUNTER — Other Ambulatory Visit: Payer: Self-pay | Admitting: Cardiovascular Disease

## 2023-03-14 ENCOUNTER — Other Ambulatory Visit: Payer: Self-pay | Admitting: *Deleted

## 2023-03-14 MED ORDER — HYDRALAZINE HCL 25 MG PO TABS: 25.0000 mg | ORAL_TABLET | Freq: Two times a day (BID) | ORAL | 2 refills | Status: DC

## 2023-03-24 ENCOUNTER — Encounter: Payer: Self-pay | Admitting: *Deleted

## 2023-03-28 ENCOUNTER — Encounter: Payer: Self-pay | Admitting: *Deleted

## 2023-03-31 ENCOUNTER — Other Ambulatory Visit: Payer: Self-pay | Admitting: Cardiovascular Disease

## 2023-03-31 DIAGNOSIS — Z5181 Encounter for therapeutic drug level monitoring: Secondary | ICD-10-CM

## 2023-03-31 DIAGNOSIS — I825Z9 Chronic embolism and thrombosis of unspecified deep veins of unspecified distal lower extremity: Secondary | ICD-10-CM

## 2023-04-01 MED ORDER — WARFARIN SODIUM 2 MG PO TABS
ORAL_TABLET | ORAL | 1 refills | Status: DC
Start: 2023-04-01 — End: 2024-01-13

## 2023-04-01 NOTE — Telephone Encounter (Addendum)
Warfarin 2mg  refill DVT Last INR 02/10/23 Last OV 12/28/22  Order for 30 day sent with refills, then resent for 90 day

## 2023-04-01 NOTE — Addendum Note (Signed)
Addended by: Pamala Hurry B on: 04/01/2023 07:27 AM   Modules accepted: Orders

## 2023-04-04 NOTE — Progress Notes (Addendum)
[facility] - Clinic Note  04/14/2023     CHIEF COMPLAINT Patient presents for Retina Follow Up  HISTORY OF PRESENT ILLNESS: [name] is a 82 y.o. female who presents to the clinic today for:  HPI     Retina Follow Up   Patient presents with  Wet AMD.  In both eyes.  This started 8 weeks ago.  Duration of 8 weeks.  Since onset it is stable.  I, the attending physician,  performed the HPI with the patient and updated documentation appropriately.        Comments   8 week retina follow up ARMD and IVA OD pt is reporting no vision changes noticed may have improved some she has floaters denies any flashes she is using latanoprost os at bedtime       Last edited by Rennis Chris, MD on 04/14/2023 12:39 PM.     Patient states she had a fall since she was here last, she was outside watering her plants and fell on her porch  Referring physician: [name], MD [address] [place],  [place] [address]  HISTORICAL INFORMATION:  Selected notes from the MEDICAL RECORD NUMBER Referred by Dr. Daisy Lazar for concern of SRF OD LEE: 11.27.20 (M. Cotter) [BCVA: OD: 20/80-- OS: 20/60-]  Ocular Hx-glaucoma (latanoprost)  PMH-DM    CURRENT MEDICATIONS: Current Outpatient Medications (Ophthalmic Drugs)  Medication Sig   dorzolamide-timolol (COSOPT) 22.3-6.8 MG/ML ophthalmic solution INSTILL 1 DROP INTO RIGHT EYE TWICE A DAY (Patient taking differently: Place 1 drop into the right eye 2 (two) times daily.)   latanoprost (XALATAN) 0.005 % ophthalmic solution Place 1 drop into the left eye at bedtime.   Polyethyl Glycol-Propyl Glycol (SYSTANE OP) Place 1 drop into the left eye daily as needed (dry eye).   No current facility-administered medications for this visit. (Ophthalmic Drugs)   Current Outpatient Medications (Other)  Medication Sig   albuterol (PROVENTIL HFA;VENTOLIN HFA) 108 (90 BASE) MCG/ACT inhaler Inhale 2 puffs into the lungs every 4 (four)  hours as needed for shortness of breath.   albuterol (PROVENTIL) (2.5 MG/3ML) 0.083% nebulizer solution Take 2.5 mg by nebulization every 6 (six) hours as needed for wheezing or shortness of breath.   Alirocumab (PRALUENT) 75 MG/ML SOAJ Inject 1 mL (75 mg total) into the skin every 14 (fourteen) days.   amitriptyline (ELAVIL) 25 MG tablet Take 25 mg by mouth at bedtime.   anastrozole (ARIMIDEX) 1 MG tablet Take 1 tablet (1 mg total) by mouth at bedtime.   carvedilol (COREG) 6.25 MG tablet TAKE ONE TABLET BY MOUTH TWICE DAILY   cetirizine (ZYRTEC) 10 MG tablet Take 10 mg by mouth daily.   Cholecalciferol (VITAMIN D-3) 1000 units CAPS Take 1,000 Units by mouth daily.   diazepam (VALIUM) 2 MG tablet Take 2 mg by mouth 2 (two) times daily as needed for anxiety (prior to eye injections).   esomeprazole (NEXIUM) 20 MG capsule Take 20 mg by mouth daily at 12 noon.   FARXIGA 5 MG TABS tablet Take 5 mg by mouth every morning.   fluticasone (FLOVENT HFA) 110 MCG/ACT inhaler Inhale 1 puff into the lungs 2 (two) times daily. Rinse mouth with water after each use   furosemide (LASIX) 40 MG tablet TAKE 1 TABLET (40 MG TOTAL) BY MOUTH DAILY. MAY TAKE EXTRA DAILY AS NEEDED FOR SWELLING (Patient taking differently: Take 40 mg by mouth daily.)   gabapentin (NEURONTIN) 300 MG capsule Take 300 mg by  mouth 3 (three) times daily as needed (pain).   glimepiride (AMARYL) 2 MG tablet Take 2 mg by mouth daily.   hydrALAZINE (APRESOLINE) 25 MG tablet Take 1 tablet (25 mg total) by mouth 2 (two) times daily at 8 am and 10 pm.   isosorbide mononitrate (IMDUR) 30 MG 24 hr tablet TAKE ONE TABLET BY MOUTH ONCE DAILY   meclizine (ANTIVERT) 25 MG tablet Take 25 mg by mouth 2 (two) times daily as needed for dizziness.   Multiple Vitamins-Minerals (PRESERVISION AREDS 2 PO) Take 1 capsule by mouth in the morning and at bedtime.   NON FORMULARY CBD Gummy   Omega-3 Fatty Acids (FISH OIL) 1200 MG CAPS Take 1,200 mg by mouth 2 (two)  times daily.   ondansetron (ZOFRAN-ODT) 4 MG disintegrating tablet Take 1 tablet (4 mg total) by mouth every 8 (eight) hours as needed.   polyethylene glycol (MIRALAX / GLYCOLAX) packet Take 17 g by mouth daily as needed for moderate constipation.   potassium chloride SA (KLOR-CON M) 20 MEQ tablet TAKE ONE-HALF TABLET BY  MOUTH DAILY (Patient taking differently: Take 10 mEq by mouth See admin instructions. Only takes when taking)   promethazine-dextromethorphan (PROMETHAZINE-DM) 6.25-15 MG/5ML syrup Take 5 mLs by mouth 4 (four) times daily as needed.   tamsulosin (FLOMAX) 0.4 MG CAPS capsule Take 1 capsule (0.4 mg total) by mouth daily.   vitamin B-12 (CYANOCOBALAMIN) 100 MCG tablet Take 100 mcg by mouth daily.   warfarin (COUMADIN) 2 MG tablet TAKE 1 TABLET BY MOUTH EVERY DAY AT BEDTIME OR AS DIRECTED BY THE COUMADIN CLINIC   No current facility-administered medications for this visit. (Other)   REVIEW OF SYSTEMS: ROS   Positive for: Genitourinary, Endocrine, Cardiovascular, Eyes Negative for: Constitutional, Gastrointestinal, Neurological, Skin, Musculoskeletal, HENT, Respiratory, Psychiatric, Allergic/Imm, Heme/Lymph Last edited by Etheleen Mayhew, COT on 04/14/2023  7:50 AM.      ALLERGIES Allergies  Allergen Reactions   Alphagan [Brimonidine] Itching   Diflunisal Swelling    Other reaction(s): ENTIRE BODY SWELLING   Vioxx [Rofecoxib] Shortness Of Breath   Metformin And Related     Kidney failure   Nexlizet [Bempedoic Acid-Ezetimibe]     Causes elevated Liver and Kidney function   Repatha [Evolocumab]     MYALGIAS   Codeine Rash   Elemental Sulfur Rash   Motrin [Ibuprofen] Rash   Penicillins Rash   Pravastatin Rash   PAST MEDICAL HISTORY Past Medical History:  Diagnosis Date   Antral gastritis    EGD 11/15   Arthritis    Asthmatic bronchitis    Back pain    Breast cancer (HCC)    right breast   CAD in native artery 03/10/2021   Chronic diastolic heart  failure (HCC) 40/98/1191   Grade 2 diastolic dysfunction.  04/2015.   Chronic kidney disease    kidney function low   COPD (chronic obstructive pulmonary disease) (HCC)    Diabetes mellitus    x 5 yrs   DVT of axillary vein, acute left (HCC) 07/24/2012   GERD (gastroesophageal reflux disease)    Glaucoma    POAG OU   Heart murmur    rheum fever at age 65   History of hiatal hernia    History of kidney stones    Hyperlipidemia 05/20/2015   Hypertension    Hypertensive retinopathy    OU   Hypothyroidism    Kidney stones    Macular degeneration    Wet OD, Dry OS   Mixed  hyperlipidemia    OSA (obstructive sleep apnea) 12/17/2021   does not use cpap on regular basis   Peripheral venous insufficiency    Pinched nerve    right elbow   Pneumonia    PONV (postoperative nausea and vomiting)    Sigmoid diverticulitis    Snoring 03/10/2021   Vertigo    chonic   Past Surgical History:  Procedure Laterality Date   ABDOMINAL HYSTERECTOMY     BACK SURGERY     spinal    BREAST LUMPECTOMY WITH RADIOACTIVE SEED LOCALIZATION Right 12/03/2020   Procedure: RIGHT BREAST LUMPECTOMY WITH RADIOACTIVE SEED LOCALIZATION;  Surgeon: Manus Rudd, MD;  Location: [facility] OR;  Service: General;  Laterality: Right;   CARDIAC CATHETERIZATION N/A 05/26/2015   Procedure: Left Heart Cath and Coronary Angiography;  Surgeon: Corky Crafts, MD;  Location[facility] INVASIVE CV LAB;  Service: Cardiovascular;  Laterality: N/A;   CATARACT EXTRACTION Bilateral    CHOLECYSTECTOMY     COLON SURGERY     COLONOSCOPY N/A 12/27/2013   Procedure: COLONOSCOPY;  Surgeon: Malissa Hippo, MD;  Location: [facility] ENDO SUITE;  Service: Endoscopy;  Laterality: N/A;  200   COLOSTOMY CLOSURE     CYSTOSCOPY/URETEROSCOPY/HOLMIUM LASER/STENT PLACEMENT Right 11/24/2022   Procedure: CYSTOSCOPY RIGHT URETEROSCOPY/HOLMIUM LASER/STENT PLACEMENT;  Surgeon: Crista Elliot, MD;  Location: [facility] ORS;  Service: Urology;  Laterality: Right;  60 MINS  FOR CASE   ENDARTERECTOMY Left 03/10/2022   Procedure: LEFT CAROTID ENDARTERECTOMY;  Surgeon: Leonie Douglas, MD;  Location: [facility] OR;  Service: Vascular;  Laterality: Left;   ESOPHAGOGASTRODUODENOSCOPY N/A 05/07/2014   Procedure: ESOPHAGOGASTRODUODENOSCOPY (EGD);  Surgeon: Malissa Hippo, MD;  Location: [facility] ENDO SUITE;  Service: Endoscopy;  Laterality: N/A;   EYE SURGERY Bilateral    Cat Sx   fracture left foot     HERNIA REPAIR     NM MYOCAR PERF WALL MOTION  01/28/2009   Normal   OTHER SURGICAL HISTORY     colostomy, colostomy reversal, for diverticulitis surgical hernia repair, arm surgery, neck surgery   PATCH ANGIOPLASTY Left 03/10/2022   Procedure: PATCH ANGIOPLASTY WITH 1X6CM Kathleen Lime;  Surgeon: Leonie Douglas, MD;  Location: [facility] OR;  Service: Vascular;  Laterality: Left;   US ECHOCARDIOGRAPHY  02/11/2010   Mild MR,trace TR & AI   FAMILY HISTORY Family History  Problem Relation Age of Onset   CVA Maternal Grandmother 28       deceased   Heart disease Maternal Grandmother    Stroke Maternal Grandmother    Breast cancer Maternal Grandmother    Other Mother 46       Cause unknown   Cancer Mother        liver   Heart attack Brother 61       deceased   Breast cancer Sister    Leukemia Maternal Aunt    Glaucoma Maternal Uncle    Bone cancer Maternal Uncle    Spina bifida Daughter    SOCIAL HISTORY Social History   Tobacco Use   Smoking status: Former    Types: Cigarettes    Passive exposure: Never   Smokeless tobacco: Never   Tobacco comments:    Smoke 1-1 1/2 packs a day  Vaping Use   Vaping status: Never Used  Substance Use Topics   Alcohol use: No   Drug use: No       OPHTHALMIC EXAM: Base Eye Exam     Visual Acuity (Snellen - Linear)  Right Left   Dist [place] 20/50 -2 20/40   Dist ph [place] 20/40 -3 NI         Tonometry (Tonopen, 7:59 AM)       Right Left   Pressure 12 18         Pupils       Pupils Dark Light Shape React APD   Right  PERRL 2 1 Round Brisk None   Left PERRL 2 1 Round Brisk None         Visual Fields       Left Right    Full Full         Extraocular Movement       Right Left    Full, Ortho Full, Ortho         Neuro/Psych     Oriented x3: Yes   Mood/Affect: Normal         Dilation     Both eyes: 2.5% Phenylephrine @ 7:59 AM           Slit Lamp and Fundus Exam     Slit Lamp Exam       Right Left   Lids/Lashes Dermatochalasis - upper lid, mild Meibomian gland dysfunction Dermatochalasis - upper lid, Telangiectasia, mild Meibomian gland dysfunction   Conjunctiva/Sclera White and quiet, superior bleb White and quiet   Cornea 1+Punctate epethelial erosions, well healed cataract wound 1+ fine Punctate epithelial erosions, arcus, mild EBMD, well healed cataract wound   Anterior Chamber narrow temporal angle, tube at 1200 Deep and quiet, narrow temporal angle   Iris round and mod dilated Round and moderately dilated to 5.26mm   Lens Posterior chamber intraocular lens, open PC Posterior chamber intraocular lens, trace Posterior capsular opacification   Anterior Vitreous Vitreous syneresis, Posterior vitreous detachment Vitreous syneresis         Fundus Exam       Right Left   Disc Sharp rim, +Pallor, +cupping w/ superior and inferior rim thinning, temporal Peripapillary atrophy mild Pallor, Sharp rim, temporal Peripapillary atrophy   C/D Ratio 0.9 0.5   Macula Flat, Blunted foveal reflex, +focal CNV/PED nasal macula with partial pigment ring, trace IRF/ SRF -- improved, Drusen, RPE mottling and clumping, no heme Flat, Blunted foveal reflex, fine drusen, mild ERM, Retinal pigment epithelial mottling and clumping, No heme or edema   Vessels attenuated, Tortuous attenuated, Tortuous   Periphery Attached, mild reticular degeneration, No heme, No RT/RD Attached; no heme           IMAGING AND PROCEDURES  Imaging and Procedures for @TODAY @  OCT, Retina - OU - Both Eyes        Right Eye Quality was good. Central Foveal Thickness: 195. Progression has been stable. Findings include normal foveal contour, retinal drusen , outer retinal tubulation, subretinal hyper-reflective material, intraretinal hyper-reflective material, intraretinal fluid, pigment epithelial detachment, subretinal fluid, outer retinal atrophy (Stable improvement in focal IRF/SRF overlying nasal PED / CNV, partial PVD).   Left Eye Quality was good. Central Foveal Thickness: 205. Progression has been stable. Findings include normal foveal contour, no IRF, no SRF, retinal drusen (Partial PVD, patchy ORA).   Notes *Images captured and stored on drive  Diagnosis / Impression:  OD: exudative ARMD -- Stable improvement in focal IRF/SRF overlying nasal PED / CNV; partial PVD OS: NFP, no IRF/SRF; +drusen -- nonexudative ARMD  Clinical management:  See below  Abbreviations: NFP - Normal foveal profile. CME - cystoid macular edema. PED - pigment epithelial detachment.  IRF - intraretinal fluid. SRF - subretinal fluid. EZ - ellipsoid zone. ERM - epiretinal membrane. ORA - outer retinal atrophy. ORT - outer retinal tubulation. SRHM - subretinal hyper-reflective material      Intravitreal Injection, Pharmacologic Agent - OD - Right Eye       Time Out 04/14/2023. 7:57 AM. Confirmed correct patient, procedure, site, and patient consented.   Anesthesia Topical anesthesia was used. Anesthetic medications included Lidocaine 2%, Proparacaine 0.5%.   Procedure Preparation included 5% betadine to ocular surface, eyelid speculum. A (32g) needle was used.   Injection: 1.25 mg Bevacizumab 1.25mg /0.59ml   Route: Intravitreal, Site: Right Eye   NDC: [id number], Lot: [id number], Expiration date: 06/03/2023   Post-op Post injection exam found visual acuity of at least counting fingers. The patient tolerated the procedure well. There were no complications. The patient received written and verbal post procedure  care education. Post injection medications were not given.             ASSESSMENT/PLAN:   ICD-10-CM   1. Exudative age-related macular degeneration of right eye with active choroidal neovascularization (HCC)  H35.3211 OCT, Retina - OU - Both Eyes    Intravitreal Injection, Pharmacologic Agent - OD - Right Eye    Bevacizumab (AVASTIN) SOLN 1.25 mg    2. Intermediate stage nonexudative age-related macular degeneration of left eye  H35.3122     3. Diabetes mellitus type 2 without retinopathy (HCC)  E11.9     4. Long term (current) use of oral hypoglycemic drugs  Z79.84     5. Essential hypertension  I10     6. Hypertensive retinopathy of both eyes  H35.033     7. Pseudophakia of both eyes  Z96.1     8. Primary open angle glaucoma of both eyes, unspecified glaucoma stage  H40.1130      1. 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, OD  - h/o delayed f/u from 8 wks to 12 on 6.20.22 due to lumpectomy -- dx'd w/ DCIS - h/o delayed follow up from 4 weeks to 8 weeks due to passing of husband (12.11.20-02.12.21)  - s/p IVA OD #1 (12.11.20), #2 (02.12.21), #3 (03.12.21), #4 (04.09.21), #5 (05.14.21), #6 (06.17.21), #7 (07.23.21), #8 (10.15.21), #9 (12.7.21), #10 (03.22.22), #11 (06.20.22), #12 (08.24.22), #13 (10.31.22), #14 (01.03.23), #15 (08.15.23), #16 (11.15.23), #17 (02.29.24), #18 (03.29.24), #19 (05.09.24), #20 (06.20.24), #21 (08.15.24)  - FA 12.11.20 confirms +CNVM  **history of increased fluid at 7+ months noted on 08.15.23** - OCT today shows stable improvement in focal IRF/SRF overlying nasal PED / CNV, ORT improved at 8 weeks  - BCVA OD improved to 20/30 from 20/40  - recommend IVA OD #22 today, 10.10.24 with follow up ext to 10 weeks  - pt wishes to proceed with injection  - RBA of procedure discussed, questions answered - informed consent obtained and signed - see procedure note  - Avastin informed consent form re-signed and scanned on 08.15.2023 (OD) - Continue to use  AT OD  - f/u 10 weeks -- DFE/OCT/possible injection  2. Age related macular degeneration, non-exudative, OS  - intermediate stage  - BCVA 20/25 - The incidence, anatomy, and pathology of dry AMD, risk of progression, and the AREDS and AREDS 2 study including smoking risks discussed with patient.   - recommend Amsler grid monitoring  3,4. Diabetes mellitus, type 2 without retinopathy - The incidence, risk factors for progression, natural history and treatment options for diabetic retinopathy  were discussed with patient.   - The  need for close monitoring of blood glucose, blood pressure, and serum lipids, avoiding cigarette or any type of tobacco, and the need for long term follow up was also discussed with patient.  - monitor   5,6. Hypertensive retinopathy OU  - discussed importance of tight BP control  - monitor   7. Pseudophakia OU, PCO OD  - s/p CE/IOL OU (Dr. Harlon Flor)  - IOL in good position  - s/p yag cap OD (04.06.22) -- good PC opening  - monitor   8. POAG OU  - formerly managed by Dr. Harlon Flor -- now following at [facility]  - s/p laser w/ Dr. Harlon Flor -- ?SLT  - s/p trab OD w/ Dr. Zetta Bills  - IOP 12,18  - okay to stay off Cosopt for now -- will restart if IOP increases at next visit  - currently on Latanoprost QHS  Ophthalmic Meds Ordered this visit:  Meds ordered this encounter  Medications   Bevacizumab (AVASTIN) SOLN 1.25 mg     Return in about 10 weeks (around 06/23/2023) for f/u exu ARMD OD, DFE, OCT.  There are no Patient Instructions on file for this visit.  This document serves as a record of services personally performed by Karie Chimera, MD, PhD. It was created on their behalf by Glee Arvin. Manson Passey, OA an ophthalmic technician. The creation of this record is the provider's dictation and/or activities during the visit.    Electronically signed by: Glee Arvin. Manson Passey, OA 04/14/23 12:40 PM  Karie Chimera, M.D., Ph.D. Diseases & Surgery of the Retina  and Vitreous [facility]  I have reviewed the above documentation for accuracy and completeness, and I agree with the above. Karie Chimera, M.D., Ph.D. 04/14/23 7:12 PM   Abbreviations: M myopia (nearsighted); A astigmatism; H hyperopia (farsighted); P presbyopia; Mrx spectacle prescription;  CTL contact lenses; OD right eye; OS left eye; OU both eyes  XT exotropia; ET esotropia; PEK punctate epithelial keratitis; PEE punctate epithelial erosions; DES dry eye syndrome; MGD meibomian gland dysfunction; ATs artificial tears; PFAT's preservative free artificial tears; NSC nuclear sclerotic cataract; PSC posterior subcapsular cataract; ERM epi-retinal membrane; PVD posterior vitreous detachment; RD retinal detachment; DM diabetes mellitus; DR diabetic retinopathy; NPDR non-proliferative diabetic retinopathy; PDR proliferative diabetic retinopathy; CSME clinically significant macular edema; DME diabetic macular edema; dbh dot blot hemorrhages; CWS cotton wool spot; POAG primary open angle glaucoma; C/D cup-to-disc ratio; HVF humphrey visual field; GVF goldmann visual field; OCT optical coherence tomography; IOP intraocular pressure; BRVO Branch retinal vein occlusion; CRVO central retinal vein occlusion; CRAO central retinal artery occlusion; BRAO branch retinal artery occlusion; RT retinal tear; SB scleral buckle; PPV pars plana vitrectomy; VH Vitreous hemorrhage; PRP panretinal laser photocoagulation; IVK intravitreal kenalog; VMT vitreomacular traction; MH Macular hole;  NVD neovascularization of the disc; NVE neovascularization elsewhere; AREDS age related eye disease study; ARMD age related macular degeneration; POAG primary open angle glaucoma; EBMD epithelial/anterior basement membrane dystrophy; ACIOL anterior chamber intraocular lens; IOL intraocular lens; PCIOL posterior chamber intraocular lens; Phaco/IOL phacoemulsification with intraocular lens placement; PRK photorefractive  keratectomy; LASIK laser assisted in situ keratomileusis; HTN hypertension; DM diabetes mellitus; COPD chronic obstructive pulmonary disease

## 2023-04-11 ENCOUNTER — Ambulatory Visit: Payer: PPO | Attending: Cardiology | Admitting: *Deleted

## 2023-04-11 DIAGNOSIS — Z5181 Encounter for therapeutic drug level monitoring: Secondary | ICD-10-CM

## 2023-04-11 DIAGNOSIS — I82A12 Acute embolism and thrombosis of left axillary vein: Secondary | ICD-10-CM

## 2023-04-11 LAB — POCT INR: INR: 1.4 — AB (ref 2.0–3.0)

## 2023-04-11 NOTE — Patient Instructions (Signed)
Take warfarin 1 1/2 tablets tonight and tomorrow night then continue 1 tablet daily   Recheck INR in 1 wk.  Call Coumadin clinic for any questions or changes in medications.

## 2023-04-13 ENCOUNTER — Inpatient Hospital Stay: Payer: PPO | Attending: Hematology and Oncology | Admitting: Hematology and Oncology

## 2023-04-13 VITALS — BP 132/65 | HR 78 | Temp 97.8°F | Resp 18 | Ht 62.0 in | Wt 159.1 lb

## 2023-04-13 DIAGNOSIS — Z79811 Long term (current) use of aromatase inhibitors: Secondary | ICD-10-CM | POA: Diagnosis not present

## 2023-04-13 DIAGNOSIS — D0511 Intraductal carcinoma in situ of right breast: Secondary | ICD-10-CM

## 2023-04-13 MED ORDER — ANASTROZOLE 1 MG PO TABS: 1.0000 mg | ORAL_TABLET | Freq: Every day | ORAL | 3 refills | Status: DC

## 2023-04-13 NOTE — Progress Notes (Signed)
Patient Care Team: [name], MD as PCP - General Chilton Si, MD as PCP - Cardiology (Cardiology) Dorothy Puffer, MD as Consulting Physician (Radiation Oncology) [name], MD as Consulting Physician (Hematology and Oncology) Manus Rudd, MD as Consulting Physician (General Surgery)  DIAGNOSIS:  Encounter Diagnosis  Name Primary?   Ductal carcinoma in situ (DCIS) of right breast Yes    SUMMARY OF ONCOLOGIC HISTORY: Oncology History  Ductal carcinoma in situ (DCIS) of right breast  10/09/2020 Initial Diagnosis   Screening mammogram detected right breast calcifications: Initial biopsy showed focal atypical ductal hyperplasia   12/03/2020 Surgery   12/03/2020: Right lumpectomy:  intermediate grade DCIS ER 90% positive, PR negative margins negative   12/29/2020 Cancer Staging   Staging form: Breast, AJCC 8th Edition - Clinical stage from 12/29/2020: Stage 0 (cTis (DCIS), cN0, cM0, G2, ER+, PR-, HER2: Not Assessed) - Signed by [name], MD on 12/29/2020 Stage prefix: Initial diagnosis Histologic grading system: 3 grade system   01/22/2021 - 02/19/2021 Radiation Therapy   Radiation Treatment Dates: 01/22/2021 through 02/19/2021 Site Technique Total Dose (Gy) Dose per Fx (Gy) Completed Fx Beam Energies  Breast, Right: Breast_Rt 3D 42.56/42.56 2.66 16/16 10X  Breast, Right: Breast_Rt_Bst 3D 8/8 2 4/4 6X, 10X   Initially was going to forego, however proceeded due to close margins.   02/2021 -  Anti-estrogen oral therapy   Anastrozole daily   04/03/2021 Cancer Staging   Staging form: Breast, AJCC 8th Edition - Pathologic: Stage 0 (pTis (DCIS), pN0, cM0) - Signed by Loa Socks, NP on 04/03/2021 Stage prefix: Initial diagnosis     CHIEF COMPLIANT: Follow-up on anastrozole therapy  History of Present Illness   The patient, with a history of breast cancer, presents for a routine follow-up. She has been on an unspecified oral medication for the past two years  following her diagnosis. She reports no noticeable side effects from the medication.  The patient has experienced several falls in the past year, one of which resulted in her being unable to get up for over an hour. She has a medical alert device, but it was lost outside and she is unsure how to request a replacement.  She also reports that she has not had a mammogram this year, which is usually scheduled by her healthcare provider. She was unsure if she had missed the appointment or if it had not been scheduled.        ALLERGIES:  is allergic to alphagan [brimonidine], diflunisal, vioxx [rofecoxib], metformin and related, nexlizet [bempedoic acid-ezetimibe], repatha [evolocumab], codeine, elemental sulfur, motrin [ibuprofen], penicillins, and pravastatin.  MEDICATIONS:  Current Outpatient Medications  Medication Sig Dispense Refill   albuterol (PROVENTIL HFA;VENTOLIN HFA) 108 (90 BASE) MCG/ACT inhaler Inhale 2 puffs into the lungs every 4 (four) hours as needed for shortness of breath. 1 Inhaler 0   albuterol (PROVENTIL) (2.5 MG/3ML) 0.083% nebulizer solution Take 2.5 mg by nebulization every 6 (six) hours as needed for wheezing or shortness of breath.     Alirocumab (PRALUENT) 75 MG/ML SOAJ Inject 1 mL (75 mg total) into the skin every 14 (fourteen) days. 2 mL 2   amitriptyline (ELAVIL) 25 MG tablet Take 25 mg by mouth at bedtime.     anastrozole (ARIMIDEX) 1 MG tablet Take 1 tablet (1 mg total) by mouth at bedtime. 90 tablet 3   carvedilol (COREG) 6.25 MG tablet TAKE ONE TABLET BY MOUTH TWICE DAILY 180 tablet 2   cetirizine (ZYRTEC) 10 MG tablet Take 10  mg by mouth daily.     Cholecalciferol (VITAMIN D-3) 1000 units CAPS Take 1,000 Units by mouth daily.     diazepam (VALIUM) 2 MG tablet Take 2 mg by mouth 2 (two) times daily as needed for anxiety (prior to eye injections).     dorzolamide-timolol (COSOPT) 22.3-6.8 MG/ML ophthalmic solution INSTILL 1 DROP INTO RIGHT EYE TWICE A DAY (Patient  taking differently: Place 1 drop into the right eye 2 (two) times daily.) 30 mL 2   esomeprazole (NEXIUM) 20 MG capsule Take 20 mg by mouth daily at 12 noon.     FARXIGA 5 MG TABS tablet Take 5 mg by mouth every morning.     fluticasone (FLOVENT HFA) 110 MCG/ACT inhaler Inhale 1 puff into the lungs 2 (two) times daily. Rinse mouth with water after each use 1 each 0   furosemide (LASIX) 40 MG tablet TAKE 1 TABLET (40 MG TOTAL) BY MOUTH DAILY. MAY TAKE EXTRA DAILY AS NEEDED FOR SWELLING (Patient taking differently: Take 40 mg by mouth daily.) 180 tablet 0   gabapentin (NEURONTIN) 300 MG capsule Take 300 mg by mouth 3 (three) times daily as needed (pain).     glimepiride (AMARYL) 2 MG tablet Take 2 mg by mouth daily.  2   hydrALAZINE (APRESOLINE) 25 MG tablet Take 1 tablet (25 mg total) by mouth 2 (two) times daily at 8 am and 10 pm. 180 tablet 2   isosorbide mononitrate (IMDUR) 30 MG 24 hr tablet TAKE ONE TABLET BY MOUTH ONCE DAILY 90 tablet 3   latanoprost (XALATAN) 0.005 % ophthalmic solution Place 1 drop into the left eye at bedtime.     meclizine (ANTIVERT) 25 MG tablet Take 25 mg by mouth 2 (two) times daily as needed for dizziness.     Multiple Vitamins-Minerals (PRESERVISION AREDS 2 PO) Take 1 capsule by mouth in the morning and at bedtime.     NON FORMULARY CBD Gummy     Omega-3 Fatty Acids (FISH OIL) 1200 MG CAPS Take 1,200 mg by mouth 2 (two) times daily.     ondansetron (ZOFRAN-ODT) 4 MG disintegrating tablet Take 1 tablet (4 mg total) by mouth every 8 (eight) hours as needed. 20 tablet 0   Polyethyl Glycol-Propyl Glycol (SYSTANE OP) Place 1 drop into the left eye daily as needed (dry eye).     polyethylene glycol (MIRALAX / GLYCOLAX) packet Take 17 g by mouth daily as needed for moderate constipation.     potassium chloride SA (KLOR-CON M) 20 MEQ tablet TAKE ONE-HALF TABLET BY  MOUTH DAILY (Patient taking differently: Take 10 mEq by mouth See admin instructions. Only takes when taking) 45  tablet 2   promethazine-dextromethorphan (PROMETHAZINE-DM) 6.25-15 MG/5ML syrup Take 5 mLs by mouth 4 (four) times daily as needed. 100 mL 0   tamsulosin (FLOMAX) 0.4 MG CAPS capsule Take 1 capsule (0.4 mg total) by mouth daily. 14 capsule 0   vitamin B-12 (CYANOCOBALAMIN) 100 MCG tablet Take 100 mcg by mouth daily.     warfarin (COUMADIN) 2 MG tablet TAKE 1 TABLET BY MOUTH EVERY DAY AT BEDTIME OR AS DIRECTED BY THE COUMADIN CLINIC 90 tablet 1   No current facility-administered medications for this visit.    PHYSICAL EXAMINATION: ECOG PERFORMANCE STATUS: 1 - Symptomatic but completely ambulatory  Vitals:   04/13/23 1329  BP: 132/65  Pulse: 78  Resp: 18  Temp: 97.8 F (36.6 C)  SpO2: 96%   Filed Weights   04/13/23 1329  Weight: 159  lb 1.6 oz (72.2 kg)      LABORATORY DATA:  I have reviewed the data as listed    Latest Ref Rng & Units 11/15/2022   11:35 AM 03/11/2022    6:50 AM 03/10/2022    6:59 AM  CMP  Glucose 70 - 99 mg/dL 82  098  119   BUN 8 - 23 mg/dL 15  19  24    Creatinine 0.44 - 1.00 mg/dL 1.47  8.29  5.62   Sodium 135 - 145 mmol/L 141  142  144   Potassium 3.5 - 5.1 mmol/L 3.8  3.8  3.9   Chloride 98 - 111 mmol/L 105  109  108   CO2 22 - 32 mmol/L 27  24  27    Calcium 8.9 - 10.3 mg/dL 9.6  8.9  9.9   Total Protein 6.5 - 8.1 g/dL 6.9   7.2   Total Bilirubin 0.3 - 1.2 mg/dL 0.5   0.6   Alkaline Phos 38 - 126 U/L 87   95   AST 15 - 41 U/L 18   28   ALT 0 - 44 U/L 19   34     Lab Results  Component Value Date   WBC 5.4 11/15/2022   HGB 14.5 11/15/2022   HCT 44.4 11/15/2022   MCV 91.9 11/15/2022   PLT 198 11/15/2022   NEUTROABS 2.9 11/15/2022    ASSESSMENT & PLAN:  Ductal carcinoma in situ (DCIS) of right breast 12/03/2020: Screening mammogram detected right breast calcifications 2.8 cm biopsy revealed intermediate grade DCIS ER 90% positive, PR negative 12/03/2020: Right lumpectomy: DCIS intermediate grade with calcifications approximately 2 cm in size.  ER  90%, PR 0%   Current treatment: Anastrozole started 12/29/2020 Anastrozole toxicities: 1.  Hot flashes: Patient had these symptoms before she started anastrozole 2. stiffness in the hands   Breast cancer surveillance: 1.  Breast exam 04/13/2023: Benign 2. mammogram 09/14/2021: Benign breast density category B.  Patient will need a new mammogram   I discussed with her that anastrozole is considered optional.  If she can tolerate it well we will continue with that but if she does not want to take more medications then it could be discontinued as well.      Breast Cancer Patient is two and a half years post-diagnosis and has been on adjuvant therapy for approximately two years. No reported side effects from medication. Mammogram overdue. -Order mammogram at [facility]. -Continue current medication regimen, refills sent to [facility] [place] pharmacy.  Falls Patient reports two falls in the past year, one resulting in minor injuries. Patient has a medical alert device but lost it. -Encourage patient to replace lost medical alert device. -Consider fall risk assessment and interventions as appropriate.  Follow-up in one year.        Orders Placed This Encounter  Procedures   MM DIAG BREAST TOMO BILATERAL    Standing Status:   Future    Standing Expiration Date:   04/12/2024    Order Specific Question:   Reason for Exam (SYMPTOM  OR DIAGNOSIS REQUIRED)    Answer:   nual mammograms with H/O breast cancer    Order Specific Question:   Preferred imaging location?    Answer:   [facility]    Order Specific Question:   Release to patient    Answer:   Immediate   The patient has a good understanding of the overall plan. she agrees with it. she will call with  any problems that may develop before the next visit here. Total time spent: 30 mins including face to face time and time spent for planning, charting and co-ordination of care   Tamsen Meek, MD 04/13/23

## 2023-04-13 NOTE — Assessment & Plan Note (Signed)
12/03/2020: Screening mammogram detected right breast calcifications 2.8 cm biopsy revealed intermediate grade DCIS ER 90% positive, PR negative 12/03/2020: Right lumpectomy: DCIS intermediate grade with calcifications approximately 2 cm in size.  ER 90%, PR 0%   Current treatment: Anastrozole started 12/29/2020 Anastrozole toxicities: 1.  Hot flashes: Patient had these symptoms before she started anastrozole 2. stiffness in the hands   Breast cancer surveillance: 1.  Breast exam 04/13/2023: Benign 2. mammogram 09/14/2021: Benign breast density category B.  Patient will need a new mammogram   I discussed with her that anastrozole is considered optional.  If she can tolerate it well we will continue with that but if she does not want to take more medications then it could be discontinued as well.   Return to clinic in 1 year for follow-up

## 2023-04-14 ENCOUNTER — Ambulatory Visit (INDEPENDENT_AMBULATORY_CARE_PROVIDER_SITE_OTHER): Payer: PPO | Admitting: Ophthalmology

## 2023-04-14 ENCOUNTER — Encounter (INDEPENDENT_AMBULATORY_CARE_PROVIDER_SITE_OTHER): Payer: Self-pay | Admitting: Ophthalmology

## 2023-04-14 DIAGNOSIS — H401130 Primary open-angle glaucoma, bilateral, stage unspecified: Secondary | ICD-10-CM | POA: Diagnosis not present

## 2023-04-14 DIAGNOSIS — E119 Type 2 diabetes mellitus without complications: Secondary | ICD-10-CM

## 2023-04-14 DIAGNOSIS — H35033 Hypertensive retinopathy, bilateral: Secondary | ICD-10-CM

## 2023-04-14 DIAGNOSIS — H353211 Exudative age-related macular degeneration, right eye, with active choroidal neovascularization: Secondary | ICD-10-CM | POA: Diagnosis not present

## 2023-04-14 DIAGNOSIS — Z7984 Long term (current) use of oral hypoglycemic drugs: Secondary | ICD-10-CM | POA: Diagnosis not present

## 2023-04-14 DIAGNOSIS — I10 Essential (primary) hypertension: Secondary | ICD-10-CM | POA: Diagnosis not present

## 2023-04-14 DIAGNOSIS — H353122 Nonexudative age-related macular degeneration, left eye, intermediate dry stage: Secondary | ICD-10-CM | POA: Diagnosis not present

## 2023-04-14 DIAGNOSIS — Z961 Presence of intraocular lens: Secondary | ICD-10-CM

## 2023-04-14 MED ORDER — BEVACIZUMAB CHEMO INJECTION 1.25MG/0.05ML SYRINGE FOR KALEIDOSCOPE
1.2500 mg | INTRAVITREAL | Status: AC | PRN
Start: 2023-04-14 — End: 2023-04-14
  Administered 2023-04-14: 1.25 mg via INTRAVITREAL

## 2023-04-18 ENCOUNTER — Ambulatory Visit: Payer: PPO | Attending: Cardiology | Admitting: *Deleted

## 2023-04-18 DIAGNOSIS — I82A12 Acute embolism and thrombosis of left axillary vein: Secondary | ICD-10-CM | POA: Diagnosis not present

## 2023-04-18 DIAGNOSIS — Z5181 Encounter for therapeutic drug level monitoring: Secondary | ICD-10-CM

## 2023-04-18 LAB — POCT INR: INR: 3.6 — AB (ref 2.0–3.0)

## 2023-04-18 NOTE — Patient Instructions (Signed)
Hold warfarin tonight then resume 1 tablet daily   Recheck INR in 2 wk.  Call Coumadin clinic for any questions or changes in medications.

## 2023-04-26 ENCOUNTER — Encounter (HOSPITAL_BASED_OUTPATIENT_CLINIC_OR_DEPARTMENT_OTHER): Payer: Self-pay | Admitting: Family

## 2023-04-26 ENCOUNTER — Ambulatory Visit (HOSPITAL_BASED_OUTPATIENT_CLINIC_OR_DEPARTMENT_OTHER): Payer: PPO | Admitting: Family

## 2023-04-26 VITALS — BP 130/68 | HR 71 | Resp 16 | Ht 62.0 in | Wt 159.7 lb

## 2023-04-26 DIAGNOSIS — I6522 Occlusion and stenosis of left carotid artery: Secondary | ICD-10-CM

## 2023-04-26 DIAGNOSIS — I825Z9 Chronic embolism and thrombosis of unspecified deep veins of unspecified distal lower extremity: Secondary | ICD-10-CM

## 2023-04-26 DIAGNOSIS — I25118 Atherosclerotic heart disease of native coronary artery with other forms of angina pectoris: Secondary | ICD-10-CM | POA: Diagnosis not present

## 2023-04-26 DIAGNOSIS — E785 Hyperlipidemia, unspecified: Secondary | ICD-10-CM

## 2023-04-26 DIAGNOSIS — I82A12 Acute embolism and thrombosis of left axillary vein: Secondary | ICD-10-CM

## 2023-04-26 DIAGNOSIS — R2681 Unsteadiness on feet: Secondary | ICD-10-CM

## 2023-04-26 DIAGNOSIS — D6859 Other primary thrombophilia: Secondary | ICD-10-CM | POA: Diagnosis not present

## 2023-04-26 DIAGNOSIS — I5032 Chronic diastolic (congestive) heart failure: Secondary | ICD-10-CM

## 2023-04-26 NOTE — Progress Notes (Signed)
Cardiology Office Note:  .   Date:  04/26/2023  ID:  [name], DOB 03-10-1941, MRN [id number] PCP: [name], MD  [facility] Providers Cardiologist:  Chilton Si, MD    History of Present Illness: .   [name] is a 82 y.o. female with history of CAD (50% LAD stenosis), chronic diastolic heart failure (grade 2), hypertension, hyperlipidemia, prior DVT on warfarin, breast cancer s/p lumpectomy and XRT, DM2, carotid stenosis s/p left carotid endarterectomy 03/2022, RBBB.  Myoview 04/2015 for chest pain LVEF 70% with small defect of moderate severity mid anterior and apical anterior region.  Felt to be due to breast attenuation artifact.  Underwent LHC 05/26/2015 with 50% LAD lesion.  There was concern for component of vasospasm and long-acting nitrates were started.  BP and lipid agents have been titrated due to poor control.  On Lasix due to lower extremity edema.  Sleep study 03/2021 with mild sleep apnea.  She was recommended for oral airway device or CPAP.  Seen 02/2022 and CTA confirmed severe stenosis of left common carotid artery and ICA.  Underwent carotid endarterectomy 03/24/2022.  Last seen 12/28/2022.  BP elevated in the office however at follow up with PharmD 01/2023 BP controlled and Hydralazine, Coreg continued.   Presents today for follow up. Notes she did have a fall this morning. Since prior stent in her eye reports she's had some balance issues. No lightheadedness, dizziness, syncope. Simply turned her head too quickly and lost her balance. Notes recently lost her nephew to cancer and funeral is planned for tomorrow which has been stressful as he was like a son to her. Reports no shortness of breath nor dyspnea on exertion. Reports no chest pain, pressure, or tightness. No edema, orthopnea, PND. Reports no palpitations.    ROS: Please see the history of present illness.    All other systems reviewed and are negative.   Studies Reviewed: [name]   EKG  Interpretation Date/Time:  Tuesday April 26 2023 10:33:41 EDT Ventricular Rate:  71 PR Interval:  184 QRS Duration:  138 QT Interval:  440 QTC Calculation: 478 R Axis:   111  Text Interpretation: Sinus rhythm with Premature supraventricular complexes Right bundle branch block Left posterior fascicular block  Stable bifascicular block Confirmed by Gillian Shields ([id number]) on 04/26/2023 10:35:00 AM    Cardiac Studies & Procedures   CARDIAC CATHETERIZATION  CARDIAC CATHETERIZATION 05/26/2015  Narrative  Mid LAD lesion, 50% stenosed. FFR of this lesion showed value of 0.85. This is felt to be nonsignificant.  Normal LVEDP.  Continue aggressive medical therapy. Would consider adding long-acting nitrate as the patient may have some component of vasospasm. Of note, in the future , if repeat catheterization was needed , would consider using a 5 [place] guide catheter as her left main is very short and the catheter tends to deep seat  Into either the LAD or the circumflex.  She can resume Coumadin  With follow-up appointment later this week.  Findings Coronary Findings Diagnostic  Dominance: Right  Left Anterior Descending  Left Circumflex  Right Coronary Artery  Intervention  No interventions have been documented.   STRESS TESTS  MYOCARDIAL PERFUSION IMAGING 10/09/2019  Narrative  Nuclear stress EF: 74%. The left ventricular ejection fraction is hyperdynamic (>65%).  There was no ST segment deviation noted during stress.  This is a low risk study. There is no evidence of ischemia or previous infarction.  The study is normal.   ECHOCARDIOGRAM  ECHOCARDIOGRAM COMPLETE  Cardiology Office Note:  .   Date:  04/26/2023  ID:  [name], DOB 03-10-1941, MRN [id number] PCP: [name], MD  [facility] Providers Cardiologist:  Chilton Si, MD    History of Present Illness: .   [name] is a 82 y.o. female with history of CAD (50% LAD stenosis), chronic diastolic heart failure (grade 2), hypertension, hyperlipidemia, prior DVT on warfarin, breast cancer s/p lumpectomy and XRT, DM2, carotid stenosis s/p left carotid endarterectomy 03/2022, RBBB.  Myoview 04/2015 for chest pain LVEF 70% with small defect of moderate severity mid anterior and apical anterior region.  Felt to be due to breast attenuation artifact.  Underwent LHC 05/26/2015 with 50% LAD lesion.  There was concern for component of vasospasm and long-acting nitrates were started.  BP and lipid agents have been titrated due to poor control.  On Lasix due to lower extremity edema.  Sleep study 03/2021 with mild sleep apnea.  She was recommended for oral airway device or CPAP.  Seen 02/2022 and CTA confirmed severe stenosis of left common carotid artery and ICA.  Underwent carotid endarterectomy 03/24/2022.  Last seen 12/28/2022.  BP elevated in the office however at follow up with PharmD 01/2023 BP controlled and Hydralazine, Coreg continued.   Presents today for follow up. Notes she did have a fall this morning. Since prior stent in her eye reports she's had some balance issues. No lightheadedness, dizziness, syncope. Simply turned her head too quickly and lost her balance. Notes recently lost her nephew to cancer and funeral is planned for tomorrow which has been stressful as he was like a son to her. Reports no shortness of breath nor dyspnea on exertion. Reports no chest pain, pressure, or tightness. No edema, orthopnea, PND. Reports no palpitations.    ROS: Please see the history of present illness.    All other systems reviewed and are negative.   Studies Reviewed: [name]   EKG  Interpretation Date/Time:  Tuesday April 26 2023 10:33:41 EDT Ventricular Rate:  71 PR Interval:  184 QRS Duration:  138 QT Interval:  440 QTC Calculation: 478 R Axis:   111  Text Interpretation: Sinus rhythm with Premature supraventricular complexes Right bundle branch block Left posterior fascicular block  Stable bifascicular block Confirmed by Gillian Shields ([id number]) on 04/26/2023 10:35:00 AM    Cardiac Studies & Procedures   CARDIAC CATHETERIZATION  CARDIAC CATHETERIZATION 05/26/2015  Narrative  Mid LAD lesion, 50% stenosed. FFR of this lesion showed value of 0.85. This is felt to be nonsignificant.  Normal LVEDP.  Continue aggressive medical therapy. Would consider adding long-acting nitrate as the patient may have some component of vasospasm. Of note, in the future , if repeat catheterization was needed , would consider using a 5 [place] guide catheter as her left main is very short and the catheter tends to deep seat  Into either the LAD or the circumflex.  She can resume Coumadin  With follow-up appointment later this week.  Findings Coronary Findings Diagnostic  Dominance: Right  Left Anterior Descending  Left Circumflex  Right Coronary Artery  Intervention  No interventions have been documented.   STRESS TESTS  MYOCARDIAL PERFUSION IMAGING 10/09/2019  Narrative  Nuclear stress EF: 74%. The left ventricular ejection fraction is hyperdynamic (>65%).  There was no ST segment deviation noted during stress.  This is a low risk study. There is no evidence of ischemia or previous infarction.  The study is normal.   ECHOCARDIOGRAM  ECHOCARDIOGRAM COMPLETE  04/25/2015  Narrative *[facility]* [address] [place], [place] [address] [phone]  ------------------------------------------------------------------- Transthoracic Echocardiography  Patient:    Lunamarie, Lyu MR #:       [id number] Study Date: 04/25/2015 Gender:     F Age:         32 Height:     157.5 cm Weight:     78 kg BSA:        1.88 m^2 Pt. Status: Room:  SONOGRAPHER  [name], RDCS PERFORMING   [facility], Outpatient ATTENDING    Chilton Si, MD ORDERING     Chilton Si, MD REFERRING    Chilton Si, MD  cc:  ------------------------------------------------------------------- LV EF: 55% -   60%  ------------------------------------------------------------------- Indications:      Chest pain (R07.2).  ------------------------------------------------------------------- History:   Risk factors:  DVT. Epistaxis. Gastritis. Acute renal failure. Anemia. Former tobacco use. Hypertension. Diabetes mellitus.  ------------------------------------------------------------------- Study Conclusions  - Left ventricle: The cavity size was normal. Systolic function was normal. The estimated ejection fraction was in the range of 55% to 60%. Wall motion was normal; there were no regional wall motion abnormalities. Features are consistent with a pseudonormal left ventricular filling pattern, with concomitant abnormal relaxation and increased filling pressure (grade 2 diastolic dysfunction). - Mitral valve: Calcified annulus. There was mild regurgitation directed posteriorly.  Transthoracic echocardiography.  M-mode, complete 2D, spectral Doppler, and color Doppler.  Birthdate:  Patient birthdate: [birth year].  Age:  Patient is 82 yr old.  Sex:  Gender: female. BMI: 31.5 kg/m^2.  Blood pressure:     152/94  Patient status: Outpatient.  Study date:  Study date: 04/25/2015. Study time: 10:12 AM.  Location:  [facility]  -------------------------------------------------------------------  ------------------------------------------------------------------- Left ventricle:  The cavity size was normal. Systolic function was normal. The estimated ejection fraction was in the range of 55% to 60%. Wall motion was normal; there were no regional  wall motion abnormalities. Features are consistent with a pseudonormal left ventricular filling pattern, with concomitant abnormal relaxation and increased filling pressure (grade 2 diastolic dysfunction).  ------------------------------------------------------------------- Aortic valve:   Trileaflet; normal thickness leaflets. Mobility was not restricted.  Doppler:  Transvalvular velocity was within the normal range. There was no stenosis. There was no regurgitation.  ------------------------------------------------------------------- Aorta:  Aortic root: The aortic root was normal in size.  ------------------------------------------------------------------- Mitral valve:   Calcified annulus. Mobility was not restricted. Doppler:  Transvalvular velocity was within the normal range. There was no evidence for stenosis. There was mild regurgitation directed posteriorly.    Peak gradient (D): 6 mm Hg.  ------------------------------------------------------------------- Left atrium:  The atrium was normal in size.  ------------------------------------------------------------------- Right ventricle:  The cavity size was normal. Wall thickness was normal. Systolic function was normal.  ------------------------------------------------------------------- Pulmonic valve:   Poorly visualized.  Doppler:  Transvalvular velocity was within the normal range. There was no evidence for stenosis.  ------------------------------------------------------------------- Tricuspid valve:   Structurally normal valve.    Doppler: Transvalvular velocity was within the normal range. There was no regurgitation.  ------------------------------------------------------------------- Pulmonary artery:   The main pulmonary artery was normal-sized. Systolic pressure was within the normal range.  ------------------------------------------------------------------- Right atrium:  The atrium was normal in  size.  ------------------------------------------------------------------- Pericardium:  There was no pericardial effusion.  ------------------------------------------------------------------- Systemic veins: Inferior vena cava: The vessel was normal in size. The respirophasic diameter changes were in the normal range (>= 50%), consistent with normal central venous pressure.  ------------------------------------------------------------------- Measurements  Left ventricle  04/25/2015  Narrative *[facility]* [address] [place], [place] [address] [phone]  ------------------------------------------------------------------- Transthoracic Echocardiography  Patient:    Lunamarie, Lyu MR #:       [id number] Study Date: 04/25/2015 Gender:     F Age:         32 Height:     157.5 cm Weight:     78 kg BSA:        1.88 m^2 Pt. Status: Room:  SONOGRAPHER  [name], RDCS PERFORMING   [facility], Outpatient ATTENDING    Chilton Si, MD ORDERING     Chilton Si, MD REFERRING    Chilton Si, MD  cc:  ------------------------------------------------------------------- LV EF: 55% -   60%  ------------------------------------------------------------------- Indications:      Chest pain (R07.2).  ------------------------------------------------------------------- History:   Risk factors:  DVT. Epistaxis. Gastritis. Acute renal failure. Anemia. Former tobacco use. Hypertension. Diabetes mellitus.  ------------------------------------------------------------------- Study Conclusions  - Left ventricle: The cavity size was normal. Systolic function was normal. The estimated ejection fraction was in the range of 55% to 60%. Wall motion was normal; there were no regional wall motion abnormalities. Features are consistent with a pseudonormal left ventricular filling pattern, with concomitant abnormal relaxation and increased filling pressure (grade 2 diastolic dysfunction). - Mitral valve: Calcified annulus. There was mild regurgitation directed posteriorly.  Transthoracic echocardiography.  M-mode, complete 2D, spectral Doppler, and color Doppler.  Birthdate:  Patient birthdate: [birth year].  Age:  Patient is 82 yr old.  Sex:  Gender: female. BMI: 31.5 kg/m^2.  Blood pressure:     152/94  Patient status: Outpatient.  Study date:  Study date: 04/25/2015. Study time: 10:12 AM.  Location:  [facility]  -------------------------------------------------------------------  ------------------------------------------------------------------- Left ventricle:  The cavity size was normal. Systolic function was normal. The estimated ejection fraction was in the range of 55% to 60%. Wall motion was normal; there were no regional  wall motion abnormalities. Features are consistent with a pseudonormal left ventricular filling pattern, with concomitant abnormal relaxation and increased filling pressure (grade 2 diastolic dysfunction).  ------------------------------------------------------------------- Aortic valve:   Trileaflet; normal thickness leaflets. Mobility was not restricted.  Doppler:  Transvalvular velocity was within the normal range. There was no stenosis. There was no regurgitation.  ------------------------------------------------------------------- Aorta:  Aortic root: The aortic root was normal in size.  ------------------------------------------------------------------- Mitral valve:   Calcified annulus. Mobility was not restricted. Doppler:  Transvalvular velocity was within the normal range. There was no evidence for stenosis. There was mild regurgitation directed posteriorly.    Peak gradient (D): 6 mm Hg.  ------------------------------------------------------------------- Left atrium:  The atrium was normal in size.  ------------------------------------------------------------------- Right ventricle:  The cavity size was normal. Wall thickness was normal. Systolic function was normal.  ------------------------------------------------------------------- Pulmonic valve:   Poorly visualized.  Doppler:  Transvalvular velocity was within the normal range. There was no evidence for stenosis.  ------------------------------------------------------------------- Tricuspid valve:   Structurally normal valve.    Doppler: Transvalvular velocity was within the normal range. There was no regurgitation.  ------------------------------------------------------------------- Pulmonary artery:   The main pulmonary artery was normal-sized. Systolic pressure was within the normal range.  ------------------------------------------------------------------- Right atrium:  The atrium was normal in  size.  ------------------------------------------------------------------- Pericardium:  There was no pericardial effusion.  ------------------------------------------------------------------- Systemic veins: Inferior vena cava: The vessel was normal in size. The respirophasic diameter changes were in the normal range (>= 50%), consistent with normal central venous pressure.  ------------------------------------------------------------------- Measurements  Left ventricle  Cardiology Office Note:  .   Date:  04/26/2023  ID:  [name], DOB 03-10-1941, MRN [id number] PCP: [name], MD  [facility] Providers Cardiologist:  Chilton Si, MD    History of Present Illness: .   [name] is a 82 y.o. female with history of CAD (50% LAD stenosis), chronic diastolic heart failure (grade 2), hypertension, hyperlipidemia, prior DVT on warfarin, breast cancer s/p lumpectomy and XRT, DM2, carotid stenosis s/p left carotid endarterectomy 03/2022, RBBB.  Myoview 04/2015 for chest pain LVEF 70% with small defect of moderate severity mid anterior and apical anterior region.  Felt to be due to breast attenuation artifact.  Underwent LHC 05/26/2015 with 50% LAD lesion.  There was concern for component of vasospasm and long-acting nitrates were started.  BP and lipid agents have been titrated due to poor control.  On Lasix due to lower extremity edema.  Sleep study 03/2021 with mild sleep apnea.  She was recommended for oral airway device or CPAP.  Seen 02/2022 and CTA confirmed severe stenosis of left common carotid artery and ICA.  Underwent carotid endarterectomy 03/24/2022.  Last seen 12/28/2022.  BP elevated in the office however at follow up with PharmD 01/2023 BP controlled and Hydralazine, Coreg continued.   Presents today for follow up. Notes she did have a fall this morning. Since prior stent in her eye reports she's had some balance issues. No lightheadedness, dizziness, syncope. Simply turned her head too quickly and lost her balance. Notes recently lost her nephew to cancer and funeral is planned for tomorrow which has been stressful as he was like a son to her. Reports no shortness of breath nor dyspnea on exertion. Reports no chest pain, pressure, or tightness. No edema, orthopnea, PND. Reports no palpitations.    ROS: Please see the history of present illness.    All other systems reviewed and are negative.   Studies Reviewed: [name]   EKG  Interpretation Date/Time:  Tuesday April 26 2023 10:33:41 EDT Ventricular Rate:  71 PR Interval:  184 QRS Duration:  138 QT Interval:  440 QTC Calculation: 478 R Axis:   111  Text Interpretation: Sinus rhythm with Premature supraventricular complexes Right bundle branch block Left posterior fascicular block  Stable bifascicular block Confirmed by Gillian Shields ([id number]) on 04/26/2023 10:35:00 AM    Cardiac Studies & Procedures   CARDIAC CATHETERIZATION  CARDIAC CATHETERIZATION 05/26/2015  Narrative  Mid LAD lesion, 50% stenosed. FFR of this lesion showed value of 0.85. This is felt to be nonsignificant.  Normal LVEDP.  Continue aggressive medical therapy. Would consider adding long-acting nitrate as the patient may have some component of vasospasm. Of note, in the future , if repeat catheterization was needed , would consider using a 5 [place] guide catheter as her left main is very short and the catheter tends to deep seat  Into either the LAD or the circumflex.  She can resume Coumadin  With follow-up appointment later this week.  Findings Coronary Findings Diagnostic  Dominance: Right  Left Anterior Descending  Left Circumflex  Right Coronary Artery  Intervention  No interventions have been documented.   STRESS TESTS  MYOCARDIAL PERFUSION IMAGING 10/09/2019  Narrative  Nuclear stress EF: 74%. The left ventricular ejection fraction is hyperdynamic (>65%).  There was no ST segment deviation noted during stress.  This is a low risk study. There is no evidence of ischemia or previous infarction.  The study is normal.   ECHOCARDIOGRAM  ECHOCARDIOGRAM COMPLETE  Cardiology Office Note:  .   Date:  04/26/2023  ID:  [name], DOB 03-10-1941, MRN [id number] PCP: [name], MD  [facility] Providers Cardiologist:  Chilton Si, MD    History of Present Illness: .   [name] is a 82 y.o. female with history of CAD (50% LAD stenosis), chronic diastolic heart failure (grade 2), hypertension, hyperlipidemia, prior DVT on warfarin, breast cancer s/p lumpectomy and XRT, DM2, carotid stenosis s/p left carotid endarterectomy 03/2022, RBBB.  Myoview 04/2015 for chest pain LVEF 70% with small defect of moderate severity mid anterior and apical anterior region.  Felt to be due to breast attenuation artifact.  Underwent LHC 05/26/2015 with 50% LAD lesion.  There was concern for component of vasospasm and long-acting nitrates were started.  BP and lipid agents have been titrated due to poor control.  On Lasix due to lower extremity edema.  Sleep study 03/2021 with mild sleep apnea.  She was recommended for oral airway device or CPAP.  Seen 02/2022 and CTA confirmed severe stenosis of left common carotid artery and ICA.  Underwent carotid endarterectomy 03/24/2022.  Last seen 12/28/2022.  BP elevated in the office however at follow up with PharmD 01/2023 BP controlled and Hydralazine, Coreg continued.   Presents today for follow up. Notes she did have a fall this morning. Since prior stent in her eye reports she's had some balance issues. No lightheadedness, dizziness, syncope. Simply turned her head too quickly and lost her balance. Notes recently lost her nephew to cancer and funeral is planned for tomorrow which has been stressful as he was like a son to her. Reports no shortness of breath nor dyspnea on exertion. Reports no chest pain, pressure, or tightness. No edema, orthopnea, PND. Reports no palpitations.    ROS: Please see the history of present illness.    All other systems reviewed and are negative.   Studies Reviewed: [name]   EKG  Interpretation Date/Time:  Tuesday April 26 2023 10:33:41 EDT Ventricular Rate:  71 PR Interval:  184 QRS Duration:  138 QT Interval:  440 QTC Calculation: 478 R Axis:   111  Text Interpretation: Sinus rhythm with Premature supraventricular complexes Right bundle branch block Left posterior fascicular block  Stable bifascicular block Confirmed by Gillian Shields ([id number]) on 04/26/2023 10:35:00 AM    Cardiac Studies & Procedures   CARDIAC CATHETERIZATION  CARDIAC CATHETERIZATION 05/26/2015  Narrative  Mid LAD lesion, 50% stenosed. FFR of this lesion showed value of 0.85. This is felt to be nonsignificant.  Normal LVEDP.  Continue aggressive medical therapy. Would consider adding long-acting nitrate as the patient may have some component of vasospasm. Of note, in the future , if repeat catheterization was needed , would consider using a 5 [place] guide catheter as her left main is very short and the catheter tends to deep seat  Into either the LAD or the circumflex.  She can resume Coumadin  With follow-up appointment later this week.  Findings Coronary Findings Diagnostic  Dominance: Right  Left Anterior Descending  Left Circumflex  Right Coronary Artery  Intervention  No interventions have been documented.   STRESS TESTS  MYOCARDIAL PERFUSION IMAGING 10/09/2019  Narrative  Nuclear stress EF: 74%. The left ventricular ejection fraction is hyperdynamic (>65%).  There was no ST segment deviation noted during stress.  This is a low risk study. There is no evidence of ischemia or previous infarction.  The study is normal.   ECHOCARDIOGRAM  ECHOCARDIOGRAM COMPLETE

## 2023-04-26 NOTE — Patient Instructions (Signed)
Medication Instructions:  Your physician recommends that you continue on your current medications as directed. Please refer to the Current Medication list given to you today.  *If you need a refill on your cardiac medications before your next appointment, please call your pharmacy*  Follow-Up: At [facility], you and your health needs are our priority.  As part of our continuing mission to provide you with exceptional heart care, we have created designated Provider Care Teams.  These Care Teams include your primary Cardiologist (physician) and Advanced Practice Providers (APPs -  Physician Assistants and Nurse Practitioners) who all work together to provide you with the care you need, when you need it.  We recommend signing up for the patient portal called "MyChart".  Sign up information is provided on this After Visit Summary.  MyChart is used to connect with patients for Virtual Visits (Telemedicine).  Patients are able to view lab/test results, encounter notes, upcoming appointments, etc.  Non-urgent messages can be sent to your provider as well.   To learn more about what you can do with MyChart, go to [email or web address].    Your next appointment:   Follow up with Dr. Duke Salvia in 6 months.

## 2023-05-04 ENCOUNTER — Ambulatory Visit: Payer: PPO | Attending: Cardiology | Admitting: *Deleted

## 2023-05-04 DIAGNOSIS — Z5181 Encounter for therapeutic drug level monitoring: Secondary | ICD-10-CM

## 2023-05-04 DIAGNOSIS — I82A12 Acute embolism and thrombosis of left axillary vein: Secondary | ICD-10-CM

## 2023-05-04 LAB — POCT INR: INR: 1.9 — AB (ref 2.0–3.0)

## 2023-05-04 NOTE — Patient Instructions (Signed)
Take warfarin 1 1/2 tablets tonight then resume 1 tablet daily   Recheck INR in 3 wk.  Call Coumadin clinic for any questions or changes in medications.

## 2023-05-05 ENCOUNTER — Ambulatory Visit (HOSPITAL_COMMUNITY): Payer: PPO | Attending: Family

## 2023-05-05 DIAGNOSIS — R296 Repeated falls: Secondary | ICD-10-CM | POA: Diagnosis not present

## 2023-05-05 DIAGNOSIS — R42 Dizziness and giddiness: Secondary | ICD-10-CM

## 2023-05-05 DIAGNOSIS — R2681 Unsteadiness on feet: Secondary | ICD-10-CM

## 2023-05-05 NOTE — Therapy (Addendum)
[name] OUTPATIENT PHYSICAL THERAPY NEURO EVALUATION   Patient Name: [name] MRN: [id number] DOB:June 07, 1941, 82 y.o., female Today's Date: 05/05/2023   PCP: [name], MDPCP - General  REFERRING PROVIDER: Caren Hazy Provider   END OF SESSION:   PT End of Session - 05/05/23 1432     Visit Number 1    Number of Visits 12    Authorization Type [employer]    Authorization Time Period no auth no limit    Progress Note Due on Visit 10    PT Start Time 0100    PT Stop Time 0140    PT Time Calculation (min) 40 min    Activity Tolerance Patient tolerated treatment well    Behavior During Therapy [facility] for tasks assessed/performed             Past Medical History:  Diagnosis Date   Antral gastritis    EGD 11/15   Arthritis    Asthmatic bronchitis    Back pain    Breast cancer (HCC)    right breast   CAD in native artery 03/10/2021   Chronic diastolic heart failure (HCC) 05/20/2015   Grade 2 diastolic dysfunction.  04/2015.   Chronic kidney disease    kidney function low   COPD (chronic obstructive pulmonary disease) (HCC)    Diabetes mellitus    x 5 yrs   DVT of axillary vein, acute left (HCC) 07/24/2012   GERD (gastroesophageal reflux disease)    Glaucoma    POAG OU   Heart murmur    rheum fever at age 67   History of hiatal hernia    History of kidney stones    Hyperlipidemia 05/20/2015   Hypertension    Hypertensive retinopathy    OU   Hypothyroidism    Kidney stones    Macular degeneration    Wet OD, Dry OS   Mixed hyperlipidemia    OSA (obstructive sleep apnea) 12/17/2021   does not use cpap on regular basis   Peripheral venous insufficiency    Pinched nerve    right elbow   Pneumonia    PONV (postoperative nausea and vomiting)    Sigmoid diverticulitis    Snoring 03/10/2021   Vertigo    chonic   Past Surgical History:  Procedure Laterality Date   ABDOMINAL HYSTERECTOMY     BACK SURGERY     spinal    BREAST  LUMPECTOMY WITH RADIOACTIVE SEED LOCALIZATION Right 12/03/2020   Procedure: RIGHT BREAST LUMPECTOMY WITH RADIOACTIVE SEED LOCALIZATION;  Surgeon: Manus Rudd, MD;  Location: [facility] OR;  Service: General;  Laterality: Right;   CARDIAC CATHETERIZATION N/A 05/26/2015   Procedure: Left Heart Cath and Coronary Angiography;  Surgeon: Corky Crafts, MD;  Location: [facility] INVASIVE CV LAB;  Service: Cardiovascular;  Laterality: N/A;   CATARACT EXTRACTION Bilateral    CHOLECYSTECTOMY     COLON SURGERY     COLONOSCOPY N/A 12/27/2013   Procedure: COLONOSCOPY;  Surgeon: Malissa Hippo, MD;  [facility]: [facility];  Service: Endoscopy;  Laterality: N/A;  200   COLOSTOMY CLOSURE     CYSTOSCOPY/URETEROSCOPY/HOLMIUM LASER/STENT PLACEMENT Right 11/24/2022   Procedure: CYSTOSCOPY RIGHT URETEROSCOPY/HOLMIUM LASER/STENT PLACEMENT;  Surgeon: Crista Elliot, MD;  Location: [facility] ORS;  Service: Urology;  Laterality: Right;  60 MINS FOR CASE   ENDARTERECTOMY Left 03/10/2022   Procedure: LEFT CAROTID ENDARTERECTOMY;  Surgeon: Leonie Douglas, MD;  Location: [facility] OR;  Service: Vascular;  Laterality: Left;  ESOPHAGOGASTRODUODENOSCOPY N/A 05/07/2014   Procedure: ESOPHAGOGASTRODUODENOSCOPY (EGD);  Surgeon: Malissa Hippo, MD;  [facility];  Service: Endoscopy;  Laterality: N/A;   EYE SURGERY Bilateral    Cat Sx   fracture left foot     HERNIA REPAIR     NM MYOCAR PERF WALL MOTION  01/28/2009   Normal   OTHER SURGICAL HISTORY     colostomy, colostomy reversal, for diverticulitis surgical hernia repair, arm surgery, neck surgery   PATCH ANGIOPLASTY Left 03/10/2022   Procedure: PATCH ANGIOPLASTY WITH 1X6CM Kathleen Lime;  Surgeon: Leonie Douglas, MD;  Location: [facility] OR;  Service: Vascular;  Laterality: Left;   US ECHOCARDIOGRAPHY  02/11/2010   Mild MR,trace TR & AI   Patient Active Problem List   Diagnosis Date Noted   Carotid stenosis 03/10/2022   Carotid stenosis, asymptomatic 03/10/2022   OSA (obstructive  sleep apnea) 12/17/2021   CAD in native artery 03/10/2021   Snoring 03/10/2021   Ductal carcinoma in situ (DCIS) of right breast 12/29/2020   Long term (current) use of anticoagulants 06/24/2020   Medication management 07/01/2016   Abnormal nuclear stress test    Chronic diastolic heart failure (HCC) 05/20/2015   Hyperlipidemia 05/20/2015   Annual physical exam 06/13/2014   Antral gastritis 05/07/2014   Anemia 05/06/2014   Renal failure 05/06/2014   Type 2 diabetes mellitus without complication (HCC)    RUQ pain 05/03/2014   Rectal bleeding 12/10/2013   Pelvic mass in female 11/06/2013   Epistaxis 08/19/2012   DVT (deep venous thrombosis) (HCC) 08/19/2012   DM (diabetes mellitus) (HCC) 08/19/2012   HTN (hypertension) 08/19/2012    ONSET DATE: 1 year+  REFERRING DIAG:  Diagnosis  R26.81 (ICD-10-CM) - Gait instability    THERAPY DIAG:  Unsteadiness on feet  Dizziness  Repeated falls  Rationale for Evaluation and Treatment: Rehabilitation  SUBJECTIVE:                                                                                                                                                                                             SUBJECTIVE STATEMENT: Patient had a stent surgically implanted last year for the right eye due to severe pressure/ macular degeneration. Patient reports she began to falling in May 2024; patient fell to the right. More recently, patient had 1-2 falls within the last few weeks. Patient fell in the house about 2 weeks ago; back has been hurting ever since. Patient describes her falls as instant; states she doesn't feel sensation that she is losing balance beforehand. Describes her falls as: "next thing you know, I am on the floor"  without knowing how she fell. Patient had a cardiologist appointment recently and explained her increased # of falls and MD referred patient to OPPT.    PERTINENT HISTORY:   Right eye stent 2023   CHF  Chronic  Vertigo   PAIN:  Are you having pain? Yes: NPRS scale: 5/10 Pain location: lumbar  Pain description: achy Aggravating factors: walking, bending Relieving factors: rest  PRECAUTIONS: None  RED FLAGS: None   WEIGHT BEARING RESTRICTIONS: No  FALLS: Has patient fallen in last 6 months? Yes. Number of falls 3  LIVING ENVIRONMENT: Lives with: lives alone Lives in: House/apartment Stairs: Yes: External: 6 steps; can reach both Has following equipment at home: Single point cane and Walker - 2 wheeled  PLOF: Independent   PATIENT GOALS: to improve her balance and decreased falls   OBJECTIVE:   DIAGNOSTIC FINDINGS: n/a  COGNITION: Overall cognitive status: Within functional limits for tasks assessed    SENSATION: WFL  DTRs:  Patella 2+ = Normal  COORDINATION:  WFL for Finger to Nose, Heel to Shin, Alternating hands on knees  POSTURE: decreased lumbar lordosis   FUNCTIONAL TESTS:  Timed up and go (TUG): 29.60 seconds with contact guard assist; Mod unsteadiness during turn around cone, reports dizziness    Dynamic Gait Index = 12  Interpretation: This result is consistent with an increased fall risk.    GAIT ANALYSIS: Gait pattern: decreased step length- Right, decreased step length- Left, and decreased stride length Distance walked: 66feet Assistive device utilized: None Level of assistance: CGA Comments: Patient      LOWER EXTREMITY MMT:    MMT Right Eval Left Eval  Hip flexion 3/5 3/5  Hip extension    Hip abduction    Hip adduction    Hip internal rotation    Hip external rotation    Knee flexion 4-/5 4-/5  Knee extension 4-/5 4-/5  Ankle dorsiflexion 3+/5 3+/5  Ankle plantarflexion    Ankle inversion    Ankle eversion    (Blank rows = not tested)   MUSCLE TONE: WFL   STAIRS: Level of Assistance: CGA Stair Negotiation Technique: Alternating Pattern  with Bilateral Rails Number of Stairs: 5  Height of Stairs: standard  Comments:  Patient with MILD unsteadiness on stairs; able to reciprocal gait up/down stairs with unilateral handrail assistance     TODAY'S TREATMENT:                                                                                                                              DATE:   05/05/23  PT Initial Evaluation     PATIENT EDUCATION: Education details: HEP Person educated: Patient Education method: Medical illustrator Education comprehension: verbalized understanding  HOME EXERCISE PROGRAM: TBD   GOALS: Goals reviewed with patient? No  SHORT TERM GOALS: Target date: 05/26/2023   1.  Patient will be able to complete the TUG(Timed Up and Go) Test within 25 seconds with contact  guard assist to improve overall gait speed and decrease Falls Risk  Baseline: 29.5 contact guard assist  Goal status: INITIAL  2.  Patient will be independent with a basic stretching/strengthening HEP Baseline:  Goal status: INITIAL   LONG TERM GOALS: Target date: 06/16/2023    Patient will be able to complete the TUG(Timed Up and Go) Test within 20 seconds with contact guard assist to improve overall gait speed and decrease Falls Risk  Baseline: 29.5 contact guard assist  Goal status: INITIAL  2.    Patient will be able to score >/= 16 on the Dynamic Gait Index to improve overall gait speed and decrease Falls Risk  Baseline: 13 Goal status: INITIAL  3.   Patient will be independent with a comprehensive strengthening HEP with 1-2/10 low back pain  Baseline:  Goal status: INITIAL  ASSESSMENT:  CLINICAL IMPRESSION: Patient is a 82 y.o. female who was seen today for physical therapy evaluation and treatment for unsteadiness, dizziness, and hx of repeated falls; 3x in the last 6 months.   OBJECTIVE IMPAIRMENTS: Abnormal gait, decreased balance, decreased endurance, difficulty walking, decreased strength, dizziness, impaired flexibility, postural dysfunction, and pain.   ACTIVITY  LIMITATIONS: carrying, lifting, bending, standing, squatting, stairs, bed mobility, and locomotion level  PARTICIPATION LIMITATIONS: meal prep, cleaning, laundry, interpersonal relationship, driving, shopping, community activity, and yard work  PERSONAL FACTORS: Age are also affecting patient's functional outcome.   REHAB POTENTIAL: Fair chronic unsteadiness 1+ year  CLINICAL DECISION MAKING: Stable/uncomplicated  EVALUATION COMPLEXITY: Moderate  PLAN:  PT FREQUENCY: 1-2x/week  PT DURATION: 6 weeks  PLANNED INTERVENTIONS: 97110-Therapeutic exercises, 97530- Therapeutic activity, 97112- Neuromuscular re-education, 97535- Self Care, 40981- Manual therapy, [phone]- Gait training, Balance training, Stair training, Dry Needling, Joint mobilization, Joint manipulation, Spinal manipulation, Spinal mobilization, Vestibular training, Visual/preceptual remediation/compensation, Cryotherapy, and Moist heat  PLAN FOR NEXT SESSION: Initiate lower extremity strength HEP    Seymour Bars, PT 05/05/2023, 2:40 PM

## 2023-05-05 NOTE — Addendum Note (Signed)
Addended by: Seymour Bars on: 05/05/2023 03:27 PM   Modules accepted: Orders

## 2023-05-06 ENCOUNTER — Ambulatory Visit (HOSPITAL_COMMUNITY): Payer: PPO | Attending: Family | Admitting: Physical Therapy

## 2023-05-06 DIAGNOSIS — R42 Dizziness and giddiness: Secondary | ICD-10-CM

## 2023-05-06 DIAGNOSIS — R296 Repeated falls: Secondary | ICD-10-CM

## 2023-05-06 DIAGNOSIS — R2681 Unsteadiness on feet: Secondary | ICD-10-CM

## 2023-05-06 NOTE — Therapy (Signed)
[name] OUTPATIENT PHYSICAL THERAPY NEURO Treatment   Patient Name: [name] MRN: [id number] DOB:24-Oct-1940, 82 y.o., female Today's Date: 05/06/2023   PCP: [name], MDPCP - General  REFERRING PROVIDER: Caren Hazy Provider   END OF SESSION:   PT End of Session - 05/06/23 1530     Visit Number 2    Number of Visits 12    Authorization Type [employer]    Authorization Time Period no auth no limit    Progress Note Due on Visit 10    PT Start Time 1450    PT Stop Time 1530    PT Time Calculation (min) 40 min    Activity Tolerance Patient tolerated treatment well    Behavior During Therapy [facility] for tasks assessed/performed              Past Medical History:  Diagnosis Date   Antral gastritis    EGD 11/15   Arthritis    Asthmatic bronchitis    Back pain    Breast cancer (HCC)    right breast   CAD in native artery 03/10/2021   Chronic diastolic heart failure (HCC) 05/20/2015   Grade 2 diastolic dysfunction.  04/2015.   Chronic kidney disease    kidney function low   COPD (chronic obstructive pulmonary disease) (HCC)    Diabetes mellitus    x 5 yrs   DVT of axillary vein, acute left (HCC) 07/24/2012   GERD (gastroesophageal reflux disease)    Glaucoma    POAG OU   Heart murmur    rheum fever at age 29   History of hiatal hernia    History of kidney stones    Hyperlipidemia 05/20/2015   Hypertension    Hypertensive retinopathy    OU   Hypothyroidism    Kidney stones    Macular degeneration    Wet OD, Dry OS   Mixed hyperlipidemia    OSA (obstructive sleep apnea) 12/17/2021   does not use cpap on regular basis   Peripheral venous insufficiency    Pinched nerve    right elbow   Pneumonia    PONV (postoperative nausea and vomiting)    Sigmoid diverticulitis    Snoring 03/10/2021   Vertigo    chonic   Past Surgical History:  Procedure Laterality Date   ABDOMINAL HYSTERECTOMY     BACK SURGERY     spinal    BREAST  LUMPECTOMY WITH RADIOACTIVE SEED LOCALIZATION Right 12/03/2020   Procedure: RIGHT BREAST LUMPECTOMY WITH RADIOACTIVE SEED LOCALIZATION;  Surgeon: Manus Rudd, MD;  Location: [facility] OR;  Service: General;  Laterality: Right;   CARDIAC CATHETERIZATION N/A 05/26/2015   Procedure: Left Heart Cath and Coronary Angiography;  Surgeon: Corky Crafts, MD;  Location: [facility] INVASIVE CV LAB;  Service: Cardiovascular;  Laterality: N/A;   CATARACT EXTRACTION Bilateral    CHOLECYSTECTOMY     COLON SURGERY     COLONOSCOPY N/A 12/27/2013   Procedure: COLONOSCOPY;  Surgeon: Malissa Hippo, MD;  [facility];  Service: Endoscopy;  Laterality: N/A;  200   COLOSTOMY CLOSURE     CYSTOSCOPY/URETEROSCOPY/HOLMIUM LASER/STENT PLACEMENT Right 11/24/2022   Procedure: CYSTOSCOPY RIGHT URETEROSCOPY/HOLMIUM LASER/STENT PLACEMENT;  Surgeon: Crista Elliot, MD;  Location: [facility];  Service: Urology;  Laterality: Right;  60 MINS FOR CASE   ENDARTERECTOMY Left 03/10/2022   Procedure: LEFT CAROTID ENDARTERECTOMY;  Surgeon: Leonie Douglas, MD;  Location: [facility] OR;  Service: Vascular;  Laterality: Left;  ESOPHAGOGASTRODUODENOSCOPY N/A 05/07/2014   Procedure: ESOPHAGOGASTRODUODENOSCOPY (EGD);  Surgeon: Malissa Hippo, MD;  [facility];  Service: Endoscopy;  Laterality: N/A;   EYE SURGERY Bilateral    Cat Sx   fracture left foot     HERNIA REPAIR     NM MYOCAR PERF WALL MOTION  01/28/2009   Normal   OTHER SURGICAL HISTORY     colostomy, colostomy reversal, for diverticulitis surgical hernia repair, arm surgery, neck surgery   PATCH ANGIOPLASTY Left 03/10/2022   Procedure: PATCH ANGIOPLASTY WITH 1X6CM Kathleen Lime;  Surgeon: Leonie Douglas, MD;  Location: [facility] OR;  Service: Vascular;  Laterality: Left;   US ECHOCARDIOGRAPHY  02/11/2010   Mild MR,trace TR & AI   Patient Active Problem List   Diagnosis Date Noted   Carotid stenosis 03/10/2022   Carotid stenosis, asymptomatic 03/10/2022   OSA (obstructive  sleep apnea) 12/17/2021   CAD in native artery 03/10/2021   Snoring 03/10/2021   Ductal carcinoma in situ (DCIS) of right breast 12/29/2020   Long term (current) use of anticoagulants 06/24/2020   Medication management 07/01/2016   Abnormal nuclear stress test    Chronic diastolic heart failure (HCC) 05/20/2015   Hyperlipidemia 05/20/2015   Annual physical exam 06/13/2014   Antral gastritis 05/07/2014   Anemia 05/06/2014   Renal failure 05/06/2014   Type 2 diabetes mellitus without complication (HCC)    RUQ pain 05/03/2014   Rectal bleeding 12/10/2013   Pelvic mass in female 11/06/2013   Epistaxis 08/19/2012   DVT (deep venous thrombosis) (HCC) 08/19/2012   DM (diabetes mellitus) (HCC) 08/19/2012   HTN (hypertension) 08/19/2012    ONSET DATE: 1 year+  REFERRING DIAG:  Diagnosis  R26.81 (ICD-10-CM) - Gait instability    THERAPY DIAG:  Unsteadiness on feet  Repeated falls  Dizziness  Rationale for Evaluation and Treatment: Rehabilitation  SUBJECTIVE:                                                                                                                                                                                             SUBJECTIVE STATEMENT: PT states that she continues to have back and buttock pain due to her fall but it is getting better.  Eager to get better.      EVAL: Patient had a stent surgically implanted last year for the right eye due to severe pressure/ macular degeneration. Patient reports she began to falling in May 2024; patient fell to the right. More recently, patient had 1-2 falls within the last few weeks. Patient fell in the house about 2 weeks ago; back has been hurting ever  since. Patient describes her falls as instant; states she doesn't feel sensation that she is losing balance beforehand. Describes her falls as: "next thing you know, I am on the floor" without knowing how she fell. Patient had a cardiologist appointment recently and  explained her increased # of falls and MD referred patient to OPPT.    PERTINENT HISTORY:   Right eye stent 2023   CHF  Chronic Vertigo   PAIN:  Are you having pain? Yes: NPRS scale: 5/10 Pain location: lumbar  Pain description: achy Aggravating factors: walking, bending Relieving factors: rest  PRECAUTIONS: None  RED FLAGS: None   WEIGHT BEARING RESTRICTIONS: No  FALLS: Has patient fallen in last 6 months? Yes. Number of falls 3  LIVING ENVIRONMENT: Lives with: lives alone Lives in: House/apartment Stairs: Yes: External: 6 steps; can reach both Has following equipment at home: Single point cane and Walker - 2 wheeled  PLOF: Independent   PATIENT GOALS: to improve her balance and decreased falls   OBJECTIVE:   DIAGNOSTIC FINDINGS: n/a  COGNITION: Overall cognitive status: Within functional limits for tasks assessed    SENSATION: WFL  DTRs:  Patella 2+ = Normal  COORDINATION:  WFL for Finger to Nose, Heel to Shin, Alternating hands on knees  POSTURE: decreased lumbar lordosis   FUNCTIONAL TESTS:  Timed up and go (TUG): 29.60 seconds with contact guard assist; Mod unsteadiness during turn around cone, reports dizziness    Dynamic Gait Index = 12  Interpretation: This result is consistent with an increased fall risk.    GAIT ANALYSIS: Gait pattern: decreased step length- Right, decreased step length- Left, and decreased stride length Distance walked: 2feet Assistive device utilized: None Level of assistance: CGA Comments: Patient      LOWER EXTREMITY MMT:    MMT Right Eval Left Eval  Hip flexion 3/5 3/5  Hip extension    Hip abduction    Hip adduction    Hip internal rotation    Hip external rotation    Knee flexion 4-/5 4-/5  Knee extension 4-/5 4-/5  Ankle dorsiflexion 3+/5 3+/5  Ankle plantarflexion    Ankle inversion    Ankle eversion    (Blank rows = not tested)   MUSCLE TONE: WFL   STAIRS: Level of Assistance:  CGA Stair Negotiation Technique: Alternating Pattern  with Bilateral Rails Number of Stairs: 5  Height of Stairs: standard  Comments: Patient with MILD unsteadiness on stairs; able to reciprocal gait up/down stairs with unilateral handrail assistance     TODAY'S TREATMENT:                                                                                                                              DATE:  05/06/23:  Heel raise x 10 Squat x 10  Tandem stance :  5x each with Rt in front then Lt Sitting: Head turns x 3  Head nods x 3   05/05/23  PT  Initial Evaluation     PATIENT EDUCATION: Education details: HEP Person educated: Patient Education method: Medical illustrator Education comprehension: verbalized understanding  HOME EXERCISE PROGRAM: Access Code: [email or web address] URL: [email or web address] Date: 05/06/2023 Prepared by: Virgina Organ  Exercises - Heel Raises with Counter Support  - 2 x daily - 7 x weekly - 1 sets - 10 reps - 3 seconds  hold - Mini Squat with Counter Support  - 2 x daily - 7 x weekly - 1 sets - 10 reps - 3 seconds  hold - Standing Tandem Balance with Counter Support  - 2 x daily - 7 x weekly - 1 sets - 10 reps - up to 15 seconds  hold - Seated Left Head Turns Vestibular Habituation  - 2 x daily - 7 x weekly - 1 sets - 10 reps - Seated Head Nods Vestibular Habituation  - 2 x daily - 7 x weekly - 1 sets - 10 reps GOALS: Goals reviewed with patient? No  SHORT TERM GOALS: Target date: 05/26/2023   1.  Patient will be able to complete the TUG(Timed Up and Go) Test within 25 seconds with contact guard assist to improve overall gait speed and decrease Falls Risk  Baseline: 29.5 contact guard assist  Goal status:on-Going   2.  Patient will be independent with a basic stretching/strengthening HEP Baseline:  Goal status: on-Going   LONG TERM GOALS: Target date: 06/16/2023    Patient will be able to complete the TUG(Timed Up and  Go) Test within 20 seconds with contact guard assist to improve overall gait speed and decrease Falls Risk  Baseline: 29.5 contact guard assist  Goal status: on-Going  2.    Patient will be able to score >/= 16 on the Dynamic Gait Index to improve overall gait speed and decrease Falls Risk  Baseline: 13 Goal status: on-Going  3.   Patient will be independent with a comprehensive strengthening HEP with 1-2/10 low back pain  Baseline:  Goal status: on-Going  ASSESSMENT:  CLINICAL IMPRESSION: Therapist reviewed evaluation and goals with patient.  Developed an initial HEP.  Pt with significant sx with vestibular exercises.  PT with decreased strength, decreased balance and hypo vestibular sx.  PT will continue to benefit from skilled PT to decrease her risk of falling.  OBJECTIVE IMPAIRMENTS: Abnormal gait, decreased balance, decreased endurance, difficulty walking, decreased strength, dizziness, impaired flexibility, postural dysfunction, and pain.   ACTIVITY LIMITATIONS: carrying, lifting, bending, standing, squatting, stairs, bed mobility, and locomotion level  PARTICIPATION LIMITATIONS: meal prep, cleaning, laundry, interpersonal relationship, driving, shopping, community activity, and yard work  PERSONAL FACTORS: Age are also affecting patient's functional outcome.   REHAB POTENTIAL: Fair chronic unsteadiness 1+ year  CLINICAL DECISION MAKING: Stable/uncomplicated  EVALUATION COMPLEXITY: Moderate  PLAN:  PT FREQUENCY: 1-2x/week  PT DURATION: 6 weeks  PLANNED INTERVENTIONS: 97110-Therapeutic exercises, 97530- Therapeutic activity, 97112- Neuromuscular re-education, 97535- Self Care, 16109- Manual therapy, [phone]- Gait training, Balance training, Stair training, Dry Needling, Joint mobilization, Joint manipulation, Spinal manipulation, Spinal mobilization, Vestibular training, Visual/preceptual remediation/compensation, Cryotherapy, and Moist heat  PLAN FOR NEXT SESSION:  Begin  marching, SLS, continue to habituate vestibular sx.  Progress to standing head nods and turns when able.   Virgina Organ, PT CLT [phone]  05/06/2023, 3:35 PM

## 2023-05-09 DIAGNOSIS — I10 Essential (primary) hypertension: Secondary | ICD-10-CM | POA: Diagnosis not present

## 2023-05-09 DIAGNOSIS — Z6828 Body mass index (BMI) 28.0-28.9, adult: Secondary | ICD-10-CM | POA: Diagnosis not present

## 2023-05-09 DIAGNOSIS — J449 Chronic obstructive pulmonary disease, unspecified: Secondary | ICD-10-CM | POA: Diagnosis not present

## 2023-05-09 DIAGNOSIS — H353122 Nonexudative age-related macular degeneration, left eye, intermediate dry stage: Secondary | ICD-10-CM | POA: Diagnosis not present

## 2023-05-09 DIAGNOSIS — E663 Overweight: Secondary | ICD-10-CM | POA: Diagnosis not present

## 2023-05-09 DIAGNOSIS — I2510 Atherosclerotic heart disease of native coronary artery without angina pectoris: Secondary | ICD-10-CM | POA: Diagnosis not present

## 2023-05-09 DIAGNOSIS — E782 Mixed hyperlipidemia: Secondary | ICD-10-CM | POA: Diagnosis not present

## 2023-05-09 DIAGNOSIS — E7849 Other hyperlipidemia: Secondary | ICD-10-CM | POA: Diagnosis not present

## 2023-05-09 DIAGNOSIS — E1159 Type 2 diabetes mellitus with other circulatory complications: Secondary | ICD-10-CM | POA: Diagnosis not present

## 2023-05-12 ENCOUNTER — Emergency Department (HOSPITAL_COMMUNITY)
Admission: EM | Admit: 2023-05-12 | Discharge: 2023-05-12 | Disposition: A | Discharge status: Home / Self Care | Payer: PPO | Attending: Emergency Medicine | Admitting: Emergency Medicine

## 2023-05-12 ENCOUNTER — Other Ambulatory Visit: Payer: Self-pay

## 2023-05-12 ENCOUNTER — Emergency Department (HOSPITAL_COMMUNITY): Payer: PPO

## 2023-05-12 ENCOUNTER — Encounter (HOSPITAL_COMMUNITY): Payer: Self-pay | Admitting: Radiology

## 2023-05-12 ENCOUNTER — Ambulatory Visit (HOSPITAL_COMMUNITY): Payer: PPO | Admitting: Physical Therapy

## 2023-05-12 DIAGNOSIS — I110 Hypertensive heart disease with heart failure: Secondary | ICD-10-CM | POA: Insufficient documentation

## 2023-05-12 DIAGNOSIS — I6782 Cerebral ischemia: Secondary | ICD-10-CM | POA: Diagnosis not present

## 2023-05-12 DIAGNOSIS — R2689 Other abnormalities of gait and mobility: Secondary | ICD-10-CM | POA: Insufficient documentation

## 2023-05-12 DIAGNOSIS — I509 Heart failure, unspecified: Secondary | ICD-10-CM | POA: Insufficient documentation

## 2023-05-12 DIAGNOSIS — Z7901 Long term (current) use of anticoagulants: Secondary | ICD-10-CM | POA: Insufficient documentation

## 2023-05-12 DIAGNOSIS — I10 Essential (primary) hypertension: Secondary | ICD-10-CM | POA: Diagnosis not present

## 2023-05-12 DIAGNOSIS — R519 Headache, unspecified: Secondary | ICD-10-CM | POA: Insufficient documentation

## 2023-05-12 DIAGNOSIS — R296 Repeated falls: Secondary | ICD-10-CM

## 2023-05-12 DIAGNOSIS — Z981 Arthrodesis status: Secondary | ICD-10-CM | POA: Diagnosis not present

## 2023-05-12 DIAGNOSIS — R2681 Unsteadiness on feet: Secondary | ICD-10-CM

## 2023-05-12 DIAGNOSIS — G9389 Other specified disorders of brain: Secondary | ICD-10-CM | POA: Diagnosis not present

## 2023-05-12 DIAGNOSIS — I639 Cerebral infarction, unspecified: Secondary | ICD-10-CM | POA: Diagnosis not present

## 2023-05-12 LAB — PROTIME-INR
INR: 1.8 — ABNORMAL HIGH (ref 0.8–1.2)
Prothrombin Time: 21.1 s — ABNORMAL HIGH (ref 11.4–15.2)

## 2023-05-12 LAB — CBC WITH DIFFERENTIAL/PLATELET
Abs Immature Granulocytes: 0.01 10*3/uL (ref 0.00–0.07)
Basophils Absolute: 0.1 10*3/uL (ref 0.0–0.1)
Basophils Relative: 1 %
Eosinophils Absolute: 0.1 10*3/uL (ref 0.0–0.5)
Eosinophils Relative: 2 %
HCT: 44.9 % (ref 36.0–46.0)
Hemoglobin: 14.6 g/dL (ref 12.0–15.0)
Immature Granulocytes: 0 %
Lymphocytes Relative: 38 %
Lymphs Abs: 2.2 10*3/uL (ref 0.7–4.0)
MCH: 29.7 pg (ref 26.0–34.0)
MCHC: 32.5 g/dL (ref 30.0–36.0)
MCV: 91.4 fL (ref 80.0–100.0)
Monocytes Absolute: 0.5 10*3/uL (ref 0.1–1.0)
Monocytes Relative: 9 %
Neutro Abs: 2.9 10*3/uL (ref 1.7–7.7)
Neutrophils Relative %: 50 %
Platelets: 217 10*3/uL (ref 150–400)
RBC: 4.91 MIL/uL (ref 3.87–5.11)
RDW: 12.4 % (ref 11.5–15.5)
WBC: 5.8 10*3/uL (ref 4.0–10.5)
nRBC: 0 % (ref 0.0–0.2)

## 2023-05-12 LAB — COMPREHENSIVE METABOLIC PANEL
ALT: 17 U/L (ref 0–44)
AST: 18 U/L (ref 15–41)
Albumin: 4 g/dL (ref 3.5–5.0)
Alkaline Phosphatase: 104 U/L (ref 38–126)
Anion gap: 9 (ref 5–15)
BUN: 15 mg/dL (ref 8–23)
CO2: 27 mmol/L (ref 22–32)
Calcium: 9.6 mg/dL (ref 8.9–10.3)
Chloride: 105 mmol/L (ref 98–111)
Creatinine, Ser: 0.84 mg/dL (ref 0.44–1.00)
GFR, Estimated: >60 mL/min (ref >60)
Glucose, Bld: 103 mg/dL — ABNORMAL HIGH (ref 70–99)
Potassium: 3.6 mmol/L (ref 3.5–5.1)
Sodium: 141 mmol/L (ref 135–145)
Total Bilirubin: 0.6 mg/dL (ref <1.2)
Total Protein: 6.9 g/dL (ref 6.5–8.1)

## 2023-05-12 LAB — TROPONIN I (HIGH SENSITIVITY): Troponin I (High Sensitivity): 7 ng/L (ref <18)

## 2023-05-12 LAB — CBG MONITORING, ED: Glucose-Capillary: 113 mg/dL — ABNORMAL HIGH (ref 70–99)

## 2023-05-12 MED ORDER — HYDRALAZINE HCL 20 MG/ML IJ SOLN
10.0000 mg | INTRAMUSCULAR | Status: AC
  Administered 2023-05-12: 10 mg via INTRAVENOUS
  Filled 2023-05-12: qty 1

## 2023-05-12 MED ORDER — GADOBUTROL 1 MMOL/ML IV SOLN
7.0000 mL | Freq: Once | INTRAVENOUS | Status: AC | PRN
  Administered 2023-05-12: 7 mL via INTRAVENOUS

## 2023-05-12 MED ORDER — HYDRALAZINE HCL 25 MG PO TABS: 25.0000 mg | ORAL_TABLET | Freq: Three times a day (TID) | ORAL | 2 refills | Status: DC

## 2023-05-12 NOTE — ED Notes (Signed)
Patient transported to MRI 

## 2023-05-12 NOTE — Therapy (Deleted)
OUTPATIENT PHYSICAL THERAPY NEURO EVALUATION   Patient Name: [name] MRN: [id number] DOB:1941/03/15, 82 y.o., female Today's Date: 05/12/2023   PCP: [name]  REFERRING PROVIDER: Alver Sorrow, NP  END OF SESSION:   Past Medical History:  Diagnosis Date   Antral gastritis    EGD 11/15   Arthritis    Asthmatic bronchitis    Back pain    Breast cancer (HCC)    right breast   CAD in native artery 03/10/2021   Chronic diastolic heart failure (HCC) 05/20/2015   Grade 2 diastolic dysfunction.  04/2015.   Chronic kidney disease    kidney function low   COPD (chronic obstructive pulmonary disease) (HCC)    Diabetes mellitus    x 5 yrs   DVT of axillary vein, acute left (HCC) 07/24/2012   GERD (gastroesophageal reflux disease)    Glaucoma    POAG OU   Heart murmur    rheum fever at age 35   History of hiatal hernia    History of kidney stones    Hyperlipidemia 05/20/2015   Hypertension    Hypertensive retinopathy    OU   Hypothyroidism    Kidney stones    Macular degeneration    Wet OD, Dry OS   Mixed hyperlipidemia    OSA (obstructive sleep apnea) 12/17/2021   does not use cpap on regular basis   Peripheral venous insufficiency    Pinched nerve    right elbow   Pneumonia    PONV (postoperative nausea and vomiting)    Sigmoid diverticulitis    Snoring 03/10/2021   Vertigo    chonic   Past Surgical History:  Procedure Laterality Date   ABDOMINAL HYSTERECTOMY     BACK SURGERY     spinal    BREAST LUMPECTOMY WITH RADIOACTIVE SEED LOCALIZATION Right 12/03/2020   Procedure: RIGHT BREAST LUMPECTOMY WITH RADIOACTIVE SEED LOCALIZATION;  Surgeon: Manus Rudd, MD;  Location: [facility] OR;  Service: General;  Laterality: Right;   CARDIAC CATHETERIZATION N/A 05/26/2015   Procedure: Left Heart Cath and Coronary Angiography;  Surgeon: Corky Crafts, MD;  Location: [facility] INVASIVE CV LAB;  Service: Cardiovascular;  Laterality: N/A;   CATARACT EXTRACTION  Bilateral    CHOLECYSTECTOMY     COLON SURGERY     COLONOSCOPY N/A 12/27/2013   Procedure: COLONOSCOPY;  Surgeon: Malissa Hippo, MD;  Location: [facility] ENDO SUITE;  Service: Endoscopy;  Laterality: N/A;  200   COLOSTOMY CLOSURE     CYSTOSCOPY/URETEROSCOPY/HOLMIUM LASER/STENT PLACEMENT Right 11/24/2022   Procedure: CYSTOSCOPY RIGHT URETEROSCOPY/HOLMIUM LASER/STENT PLACEMENT;  Surgeon: Crista Elliot, MD;  Location: [facility] ORS;  Service: Urology;  Laterality: Right;  60 MINS FOR CASE   ENDARTERECTOMY Left 03/10/2022   Procedure: LEFT CAROTID ENDARTERECTOMY;  Surgeon: Leonie Douglas, MD;  Location: [facility] OR;  Service: Vascular;  Laterality: Left;   ESOPHAGOGASTRODUODENOSCOPY N/A 05/07/2014   Procedure: ESOPHAGOGASTRODUODENOSCOPY (EGD);  Surgeon: Malissa Hippo, MD;  Location: [facility] ENDO SUITE;  Service: Endoscopy;  Laterality: N/A;   EYE SURGERY Bilateral    Cat Sx   fracture left foot     HERNIA REPAIR     NM MYOCAR PERF WALL MOTION  01/28/2009   Normal   OTHER SURGICAL HISTORY     colostomy, colostomy reversal, for diverticulitis surgical hernia repair, arm surgery, neck surgery   PATCH ANGIOPLASTY Left 03/10/2022   Procedure: PATCH ANGIOPLASTY WITH 1X6CM Kathleen Lime;  Surgeon: Leonie Douglas, MD;  Location: [facility] OR;  Service: Vascular;  Laterality: Left;   US ECHOCARDIOGRAPHY  02/11/2010   Mild MR,trace TR & AI   Patient Active Problem List   Diagnosis Date Noted   Carotid stenosis 03/10/2022   Carotid stenosis, asymptomatic 03/10/2022   OSA (obstructive sleep apnea) 12/17/2021   CAD in native artery 03/10/2021   Snoring 03/10/2021   Ductal carcinoma in situ (DCIS) of right breast 12/29/2020   Long term (current) use of anticoagulants 06/24/2020   Medication management 07/01/2016   Abnormal nuclear stress test    Chronic diastolic heart failure (HCC) 05/20/2015   Hyperlipidemia 05/20/2015   Annual physical exam 06/13/2014   Antral gastritis 05/07/2014   Anemia 05/06/2014   Renal failure  05/06/2014   Type 2 diabetes mellitus without complication (HCC)    RUQ pain 05/03/2014   Rectal bleeding 12/10/2013   Pelvic mass in female 11/06/2013   Epistaxis 08/19/2012   DVT (deep venous thrombosis) (HCC) 08/19/2012   DM (diabetes mellitus) (HCC) 08/19/2012   HTN (hypertension) 08/19/2012    ONSET DATE: chronic  REFERRING DIAG:  Diagnosis  R26.81 (ICD-10-CM) - Gait instability    THERAPY DIAG:  Decreased strength, decreased balance   Rationale for Evaluation and Treatment: Rehabilitation  SUBJECTIVE:                                                                                                                                                                                             SUBJECTIVE STATEMENT: *** Pt accompanied by: {accompnied:27141}  PERTINENT HISTORY: see above   PAIN:  Are you having pain? {OPRCPAIN:27236}  PRECAUTIONS: Fall   WEIGHT BEARING RESTRICTIONS: No  FALLS: Has patient fallen in last 6 months? {fallsyesno:27318}  LIVING ENVIRONMENT: Lives with: {OPRC lives with:25569::"lives with their family"} Lives in: {Lives in:25570} Stairs: {opstairs:27293} Has following equipment at home: {Assistive devices:23999}  PLOF: {PLOF:24004}  PATIENT GOALS: ***  OBJECTIVE:  Note: Objective measures were completed at Evaluation unless otherwise noted.  DIAGNOSTIC FINDINGS: *** MUSCLE LENGTH: Hamstrings: Right *** deg; Left *** deg Maisie Fus test: Right *** deg; Left *** deg  DTRs:  {DTR SITE:24025}  POSTURE: {posture:25561}  LOWER EXTREMITY ROM:     {AROM/PROM:27142}  Right Eval Left Eval  Hip flexion    Hip extension    Hip abduction    Hip adduction    Hip internal rotation    Hip external rotation    Knee flexion    Knee extension    Ankle dorsiflexion    Ankle plantarflexion    Ankle inversion    Ankle eversion     (Blank rows = not tested)  LOWER EXTREMITY MMT:  MMT Right Eval Left Eval  Hip flexion    Hip  extension    Hip abduction    Hip adduction    Hip internal rotation    Hip external rotation    Knee flexion    Knee extension    Ankle dorsiflexion    Ankle plantarflexion    Ankle inversion    Ankle eversion    (Blank rows = not tested)  BED MOBILITY:  {Bed mobility:24027}  TRANSFERS: Assistive device utilized: {Assistive devices:23999}  Sit to stand: {Levels of assistance:24026} Stand to sit: {Levels of assistance:24026} Chair to chair: {Levels of assistance:24026} Floor: {Levels of assistance:24026}  RAMP:  Level of Assistance: {Levels of assistance:24026} Assistive device utilized: {Assistive devices:23999} Ramp Comments: ***  CURB:  Level of Assistance: {Levels of assistance:24026} Assistive device utilized: {Assistive devices:23999} Curb Comments: ***  STAIRS: Level of Assistance: {Levels of assistance:24026} Stair Negotiation Technique: {Stair Technique:27161} with {Rail Assistance:27162} Number of Stairs: ***  Height of Stairs: ***  Comments: ***  GAIT: Gait pattern: {gait characteristics:25376} Distance walked: *** Assistive device utilized: {Assistive devices:23999} Level of assistance: {Levels of assistance:24026} Comments: ***  FUNCTIONAL TESTS:  {Functional tests:24029}  PATIENT SURVEYS:  {rehab surveys:24030}  TODAY'S TREATMENT:                                                                                                                              DATE: ***    PATIENT EDUCATION: Education details: *** Person educated: {Person educated:25204} Education method: {Education Method:25205} Education comprehension: {Education Comprehension:25206}  HOME EXERCISE PROGRAM: ***  GOALS: Goals reviewed with patient? {yes/no:20286}  SHORT TERM GOALS: Target date: ***  *** Baseline: Goal status: INITIAL  2.  *** Baseline:  Goal status: INITIAL  3.  *** Baseline:  Goal status: INITIAL  4.  *** Baseline:  Goal status:  INITIAL  5.  *** Baseline:  Goal status: INITIAL  6.  *** Baseline:  Goal status: INITIAL  LONG TERM GOALS: Target date: ***  *** Baseline:  Goal status: INITIAL  2.  *** Baseline:  Goal status: INITIAL  3.  *** Baseline:  Goal status: INITIAL  4.  *** Baseline:  Goal status: INITIAL  5.  *** Baseline:  Goal status: INITIAL  6.  *** Baseline:  Goal status: INITIAL  ASSESSMENT:  CLINICAL IMPRESSION: Patient is a *** y.o. *** who was seen today for physical therapy evaluation and treatment for ***.   OBJECTIVE IMPAIRMENTS: {opptimpairments:25111}.   ACTIVITY LIMITATIONS: {activitylimitations:27494}  PARTICIPATION LIMITATIONS: {participationrestrictions:25113}  PERSONAL FACTORS: {Personal factors:25162} are also affecting patient's functional outcome.   REHAB POTENTIAL: {rehabpotential:25112}  CLINICAL DECISION MAKING: {clinical decision making:25114}  EVALUATION COMPLEXITY: {Evaluation complexity:25115}  PLAN:  PT FREQUENCY: {rehab frequency:25116}  PT DURATION: {rehab duration:25117}  PLANNED INTERVENTIONS: {rehab planned interventions:25118::"97110-Therapeutic exercises","97530- Therapeutic [phone]- Neuromuscular re-education","97535- Self JXBJ","47829- Manual therapy"}  PLAN FOR NEXT SESSION: Virgina Organ, PT CLT [phone]  05/12/2023, 8:32 AM

## 2023-05-12 NOTE — ED Provider Notes (Signed)
Pt signed out by Dr. Hyacinth Meeker pending MRI.  MRI brain reviewed by me.  I agree with the radiologist.  10 mm focus of T2 FLAIR hyperintense signal abnormality within  the medial right cerebellar hemisphere (without definite volume loss  at this site). Centrally within this region of signal abnormality,  there is a superimposed punctate focus of susceptibility-weighted  signal loss which could reflect an age-indeterminate microhemorrhage  or calcification. The constellation of findings could reflect an  acute microhemorrhage with surrounding edema, a remote small-vessel  insult or a hamartoma (with superimposed chronic microhemorrhage or  calcification). However, alternative etiologies (including a primary  CNS neoplasm) cannot be excluded. Post-contrast MR imaging of the  brain is recommended at this time for further characterization. Even  if there is no corresponding pathologic enhancement at this site on  follow-up, additional follow-up MR imaging may be necessary to  ensure stability.  2. Moderate multifocal T2 FLAIR hyperintense signal abnormality  within the cerebral white matter, nonspecific but most often  secondary to chronic small vessel ischemia.    Due to the radiologist recommendations, a MRI brain with contrast was ordered.   MRI with contrast:  There is a tiny focus of central enhancement within the  previously described lesion in the right cerebellar hemisphere. This  could reflect enhancement at site of an acute microhemorrhage.  However this finding remains indeterminate, and alternative  etiologies (including a small intracranial metastasis) cannot be  excluded. A short-interval follow-up brain MRI (with and without  contrast) is recommended in 6-8 weeks.  2. No pathologic intracranial enhancement elsewhere.    Due to this finding, pt d/w Dr. Amada Jupiter.  He recommended a CT head.  CT head: No acute intracranial abnormality.   Pt d/w Dr. Derry Lory  (Neurology) who recommends d/c with outpatient f/u.  Pt is stable for d/c.  Return if worse.  F/u with pcp.  Repeat MRI in 6-8 weeks.   Jacalyn Lefevre, MD 05/12/23 2200

## 2023-05-12 NOTE — Discharge Instructions (Addendum)
Thankfully all of your testing has been reassuring, your blood pressure has improved, the last blood pressure check was 176/81, it would benefit you to start taking the hydralazine 3 times a day instead of twice a day, you can follow-up with Dr. Duke Salvia to have this further evaluated.  The radiologist recommends that you get a repeat MRI brain with and without contrast in 6-8 weeks for follow up.

## 2023-05-12 NOTE — ED Triage Notes (Signed)
Pt states her blood pressure was elevated this at rehab and she did take her BP medication this morning. Pt endorses headache.

## 2023-05-12 NOTE — ED Notes (Signed)
Patient transported to CT 

## 2023-05-12 NOTE — ED Provider Notes (Signed)
[place] EMERGENCY DEPARTMENT AT [facility] Provider Note   CSN: [id number] Arrival date & time: 05/12/23  1140     History  Chief Complaint  Patient presents with   Hypertension    [name] is a 82 y.o. female.   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  She has been taking her medications as prescribed, she does report that in the last 2 weeks she has had to go to 2 funerals 1 for her nephew and 1 for her best friend yesterday.  She denies chest pain or shortness of breath, she has noted that for some time including several months she has had difficulty ambulating feeling like she falls unexpectedly or loses her balance and does not know why, she has not been worked up for this but was referred to physical therapy and rehab.  She denies numbness or tingling of the arms or the legs and has not had chest pain shortness of breath fevers or chills no nausea or vomiting or diarrhea.  She does have a right-sided headache, she is on Coumadin because of a history of blood clot    Home Medications Prior to Admission medications   Medication Sig Start Date End Date Taking? Authorizing Provider  albuterol (PROVENTIL HFA;VENTOLIN HFA) 108 (90 BASE) MCG/ACT inhaler Inhale 2 puffs into the lungs every 4 (four) hours as needed for shortness of breath. 06/04/15   Chilton Si, MD  albuterol (PROVENTIL) (2.5 MG/3ML) 0.083% nebulizer solution Take 2.5 mg by nebulization every 6 (six) hours as needed for wheezing or shortness of breath.    [provider]  Alirocumab (PRALUENT) 75  MG/ML SOAJ Inject 1 mL (75 mg total) into the skin every 14 (fourteen) days. 03/09/23   Chilton Si, MD  amitriptyline (ELAVIL) 25 MG tablet Take 25 mg by mouth at bedtime.    [provider]  anastrozole (ARIMIDEX) 1 MG tablet Take 1 tablet (1 mg total) by mouth at bedtime. 04/13/23   [name], MD  carvedilol (COREG) 6.25 MG tablet TAKE ONE TABLET BY MOUTH TWICE DAILY 02/23/23   Chilton Si, MD  cetirizine (ZYRTEC) 10 MG tablet Take 10 mg by mouth daily.    [provider]  Cholecalciferol (VITAMIN D-3) 1000 units CAPS Take 1,000 Units by mouth daily.    [provider]  diazepam (VALIUM) 2 MG tablet Take 2 mg by mouth 2 (two) times daily as needed for anxiety (prior to eye injections). 04/10/20   [provider]  dorzolamide-timolol (COSOPT) 22.3-6.8 MG/ML ophthalmic solution INSTILL 1 DROP INTO RIGHT EYE TWICE A DAY Patient taking differently: Place 1 drop into the right eye 2 (two) times daily. 08/28/20   Rennis Chris, MD  esomeprazole (NEXIUM) 20 MG capsule Take 20 mg by mouth daily at 12 noon.    [provider]  FARXIGA 5 MG TABS tablet Take 5 mg by mouth every morning. 10/01/20   [provider]  fluticasone (FLOVENT HFA) 110 MCG/ACT inhaler Inhale 1 puff into the lungs 2 (two) times daily. Rinse mouth with water after each use 06/06/22   Particia Nearing, PA-C  furosemide (LASIX) 40 MG tablet TAKE 1 TABLET (40 MG TOTAL) BY MOUTH DAILY. MAY TAKE EXTRA DAILY AS NEEDED FOR SWELLING Patient taking differently: Take 40 mg by mouth daily. 04/15/21   Chilton Si, MD  gabapentin (NEURONTIN) 300 MG capsule Take 300 mg by mouth 3 (three) times daily as needed (pain). 05/08/19   [provider]  glimepiride (AMARYL) 2 MG tablet Take 2 mg by mouth daily. 08/26/17   [provider]  hydrALAZINE (APRESOLINE) 25 MG tablet Take 1 tablet (25 mg total) by mouth 3 (three) times daily. 05/12/23 06/11/23  Eber Hong, MD   isosorbide mononitrate (IMDUR) 30 MG 24 hr tablet TAKE ONE TABLET BY MOUTH ONCE DAILY 02/03/23   Chilton Si, MD  latanoprost (XALATAN) 0.005 % ophthalmic solution Place 1 drop into the left eye at bedtime.    [provider]  meclizine (ANTIVERT) 25 MG tablet Take 25 mg by mouth 2 (two) times daily as needed for dizziness.    [provider]  Multiple Vitamins-Minerals (PRESERVISION AREDS 2 PO) Take 1 capsule by mouth in the morning and at bedtime.    [provider]  NON FORMULARY CBD Gummy    [provider]  Omega-3 Fatty Acids (FISH OIL) 1200 MG CAPS Take 1,200 mg by mouth 2 (two) times daily.    [provider]  ondansetron (ZOFRAN-ODT) 4 MG disintegrating tablet Take 1 tablet (4 mg total) by mouth every 8 (eight) hours as needed. 11/15/22   Jacalyn Lefevre, MD  Polyethyl Glycol-Propyl Glycol (SYSTANE OP) Place 1 drop into the left eye daily as needed (dry eye).    [provider]  polyethylene glycol (MIRALAX / GLYCOLAX) packet Take 17 g by mouth daily as needed for moderate constipation.    [provider]  potassium chloride SA (KLOR-CON M) 20 MEQ tablet TAKE ONE-HALF TABLET BY  MOUTH DAILY Patient taking differently: Take 10 mEq by mouth See admin instructions. Only takes when taking 08/11/21   Chilton Si, MD  promethazine-dextromethorphan (PROMETHAZINE-DM) 6.25-15 MG/5ML syrup Take 5 mLs by mouth 4 (four) times daily as needed. 06/06/22   Particia Nearing, PA-C  tamsulosin (FLOMAX) 0.4 MG CAPS capsule Take 1 capsule (0.4 mg total) by mouth daily. 11/15/22   Jacalyn Lefevre, MD  vitamin B-12 (CYANOCOBALAMIN) 100 MCG tablet Take 100 mcg by mouth daily. 03/04/14   [provider]  warfarin (COUMADIN) 2 MG tablet TAKE 1 TABLET BY MOUTH EVERY DAY AT BEDTIME OR AS DIRECTED BY THE COUMADIN CLINIC 04/01/23   Chilton Si, MD      Allergies    Alphagan [brimonidine], Diflunisal, Vioxx [rofecoxib], Metformin  and related, Nexlizet [bempedoic acid-ezetimibe], Repatha [evolocumab], Codeine, Elemental sulfur, Motrin [ibuprofen], Penicillins, and Pravastatin    Review of Systems   Review of Systems  All other systems reviewed and are negative.   Physical Exam Updated Vital Signs BP (!) 176/81 (BP Location: Left Arm)   Pulse 62   Temp 97.9 F (36.6 C) (Oral)   Resp 20   Ht 1.575 m (5\' 2" )   Wt 73 kg   SpO2 98%   BMI 29.45 kg/m  Physical Exam Vitals and nursing note reviewed.  Constitutional:      General: She is not in acute distress.    Appearance: She is well-developed.  HENT:     Head: Normocephalic and atraumatic.     Mouth/Throat:     Pharynx: No oropharyngeal exudate.  Eyes:     General: No scleral icterus.  Right eye: No discharge.        Left eye: No discharge.     Conjunctiva/sclera: Conjunctivae normal.     Pupils: Pupils are equal, round, and reactive to light.  Neck:     Thyroid: No thyromegaly.     Vascular: No JVD.  Cardiovascular:     Rate and Rhythm: Normal rate and regular rhythm.     Heart sounds: Normal heart sounds. No murmur heard.    No friction rub. No gallop.  Pulmonary:     Effort: Pulmonary effort is normal. No respiratory distress.     Breath sounds: Normal breath sounds. No wheezing or rales.  Abdominal:     General: Bowel sounds are normal. There is no distension.     Palpations: Abdomen is soft. There is no mass.     Tenderness: There is no abdominal tenderness.  Musculoskeletal:        General: No tenderness. Normal range of motion.     Cervical back: Normal range of motion and neck supple.  Lymphadenopathy:     Cervical: No cervical adenopathy.  Skin:    General: Skin is warm and dry.     Findings: No erythema or rash.  Neurological:     Mental Status: She is alert.     Coordination: Coordination normal.     Comments: Speech is clear, cranial nerves III through XII are intact, memory is intact, strength is normal in all 4  extremities including grips and strength at the bilateral thighs, knees and ankles to extention and flexion, sensation is intact to light touch and pinprick in all 4 extremities. Coordination as tested by finger-nose-finger is normal, no limb ataxia. Normal gait, normal reflexes at the patellar tendons bilaterally  Psychiatric:        Behavior: Behavior normal.     ED Results / Procedures / Treatments   Labs (all labs ordered are listed, but only abnormal results are displayed) Labs Reviewed  COMPREHENSIVE METABOLIC PANEL - Abnormal; Notable for the following components:      Result Value   Glucose, Bld 103 (*)    All other components within normal limits  PROTIME-INR - Abnormal; Notable for the following components:   Prothrombin Time 21.1 (*)    INR 1.8 (*)    All other components within normal limits  CBC WITH DIFFERENTIAL/PLATELET  TROPONIN I (HIGH SENSITIVITY)    EKG EKG Interpretation Date/Time:  Thursday May 12 2023 12:01:06 EST Ventricular Rate:  80 PR Interval:  198 QRS Duration:  143 QT Interval:  437 QTC Calculation: 505 R Axis:   109  Text Interpretation: Sinus rhythm RBBB and LPFB Confirmed by Eber Hong ([id number]) on 05/12/2023 12:06:04 PM  Radiology No results found.  Procedures Procedures    Medications Ordered in ED Medications  hydrALAZINE (APRESOLINE) injection 10 mg (10 mg Intravenous Given 05/12/23 1344)    ED Course/ Medical Decision Making/ A&P                                 Medical Decision Making Amount and/or Complexity of Data Reviewed Labs: ordered. Radiology: ordered.  Risk Prescription drug management.   The patient does not appear to be in distress, she does have severe hypertension measured 227/82, she has not had a workup for the dizziness and off-balance feeling so we will obtain neuroimaging, labs and treat her blood pressure, she is agreeable to this plan.  She does  not appear to be in acute distress and has no focal  neurologic deficits at this time.  Labs unremarkable, blood pressure improved at 176/81, will increase her hydralazine dosing to 3 times daily.  MRI pending at the time of change of shift, Dr. Particia Nearing will follow-up results with patient, otherwise patient appears stable for discharge        Final Clinical Impression(s) / ED Diagnoses Final diagnoses:  Primary hypertension  Acute nonintractable headache, unspecified headache type  Imbalance    Rx / DC Orders ED Discharge Orders          Ordered    hydrALAZINE (APRESOLINE) 25 MG tablet  3 times daily        05/12/23 1541              Eber Hong, MD 05/12/23 1542

## 2023-05-12 NOTE — Therapy (Deleted)
OUTPATIENT PHYSICAL THERAPY VESTIBULAR EVALUATION     Patient Name: [name] MRN: [id number] DOB:11-26-1940, 82 y.o., female Today's Date: 05/12/2023  END OF SESSION:   Past Medical History:  Diagnosis Date   Antral gastritis    EGD 11/15   Arthritis    Asthmatic bronchitis    Back pain    Breast cancer (HCC)    right breast   CAD in native artery 03/10/2021   Chronic diastolic heart failure (HCC) 05/20/2015   Grade 2 diastolic dysfunction.  04/2015.   Chronic kidney disease    kidney function low   COPD (chronic obstructive pulmonary disease) (HCC)    Diabetes mellitus    x 5 yrs   DVT of axillary vein, acute left (HCC) 07/24/2012   GERD (gastroesophageal reflux disease)    Glaucoma    POAG OU   Heart murmur    rheum fever at age 89   History of hiatal hernia    History of kidney stones    Hyperlipidemia 05/20/2015   Hypertension    Hypertensive retinopathy    OU   Hypothyroidism    Kidney stones    Macular degeneration    Wet OD, Dry OS   Mixed hyperlipidemia    OSA (obstructive sleep apnea) 12/17/2021   does not use cpap on regular basis   Peripheral venous insufficiency    Pinched nerve    right elbow   Pneumonia    PONV (postoperative nausea and vomiting)    Sigmoid diverticulitis    Snoring 03/10/2021   Vertigo    chonic   Past Surgical History:  Procedure Laterality Date   ABDOMINAL HYSTERECTOMY     BACK SURGERY     spinal    BREAST LUMPECTOMY WITH RADIOACTIVE SEED LOCALIZATION Right 12/03/2020   Procedure: RIGHT BREAST LUMPECTOMY WITH RADIOACTIVE SEED LOCALIZATION;  Surgeon: Manus Rudd, MD;  Location: [facility] OR;  Service: General;  Laterality: Right;   CARDIAC CATHETERIZATION N/A 05/26/2015   Procedure: Left Heart Cath and Coronary Angiography;  Surgeon: Corky Crafts, MD;  Location: [facility] INVASIVE CV LAB;  Service: Cardiovascular;  Laterality: N/A;   CATARACT EXTRACTION Bilateral    CHOLECYSTECTOMY     COLON SURGERY     COLONOSCOPY  N/A 12/27/2013   Procedure: COLONOSCOPY;  Surgeon: Malissa Hippo, MD;  Location: [facility];  Service: Endoscopy;  Laterality: N/A;  200   COLOSTOMY CLOSURE     CYSTOSCOPY/URETEROSCOPY/HOLMIUM LASER/STENT PLACEMENT Right 11/24/2022   Procedure: CYSTOSCOPY RIGHT URETEROSCOPY/HOLMIUM LASER/STENT PLACEMENT;  Surgeon: Crista Elliot, MD;  Location: [facility] ORS;  Service: Urology;  Laterality: Right;  60 MINS FOR CASE   ENDARTERECTOMY Left 03/10/2022   Procedure: LEFT CAROTID ENDARTERECTOMY;  Surgeon: Leonie Douglas, MD;  Location: [facility] OR;  Service: Vascular;  Laterality: Left;   ESOPHAGOGASTRODUODENOSCOPY N/A 05/07/2014   Procedure: ESOPHAGOGASTRODUODENOSCOPY (EGD);  Surgeon: Malissa Hippo, MD;  Location: [facility];  Service: Endoscopy;  Laterality: N/A;   EYE SURGERY Bilateral    Cat Sx   fracture left foot     HERNIA REPAIR     NM MYOCAR PERF WALL MOTION  01/28/2009   Normal   OTHER SURGICAL HISTORY     colostomy, colostomy reversal, for diverticulitis surgical hernia repair, arm surgery, neck surgery   PATCH ANGIOPLASTY Left 03/10/2022   Procedure: PATCH ANGIOPLASTY WITH 1X6CM Kathleen Lime;  Surgeon: Leonie Douglas, MD;  Location: [facility] OR;  Service: Vascular;  Laterality: Left;   US ECHOCARDIOGRAPHY  02/11/2010   Mild MR,trace TR & AI   Patient Active Problem List   Diagnosis Date Noted   Carotid stenosis 03/10/2022   Carotid stenosis, asymptomatic 03/10/2022   OSA (obstructive sleep apnea) 12/17/2021   CAD in native artery 03/10/2021   Snoring 03/10/2021   Ductal carcinoma in situ (DCIS) of right breast 12/29/2020   Long term (current) use of anticoagulants 06/24/2020   Medication management 07/01/2016   Abnormal nuclear stress test    Chronic diastolic heart failure (HCC) 05/20/2015   Hyperlipidemia 05/20/2015   Annual physical exam 06/13/2014   Antral gastritis 05/07/2014   Anemia 05/06/2014   Renal failure 05/06/2014   Type 2 diabetes mellitus without complication (HCC)     RUQ pain 05/03/2014   Rectal bleeding 12/10/2013   Pelvic mass in female 11/06/2013   Epistaxis 08/19/2012   DVT (deep venous thrombosis) (HCC) 08/19/2012   DM (diabetes mellitus) (HCC) 08/19/2012   HTN (hypertension) 08/19/2012    PCP: [name]  REFERRING PROVIDER: ***  REFERRING DIAG: ***  THERAPY DIAG:  No diagnosis found.  ONSET DATE: ***  Rationale for Evaluation and Treatment: {HABREHAB:27488}  SUBJECTIVE:   SUBJECTIVE STATEMENT: *** Pt accompanied by: {accompnied:27141}  PERTINENT HISTORY: ***  PAIN:  Are you having pain? {OPRCPAIN:27236}  PRECAUTIONS: {Therapy precautions:24002}  RED FLAGS: {PT Red Flags:29287}   WEIGHT BEARING RESTRICTIONS: {Yes ***/No:24003}  FALLS: Has patient fallen in last 6 months? {fallsyesno:27318}  LIVING ENVIRONMENT: Lives with: {OPRC lives with:25569::"lives with their family"} Lives in: {Lives in:25570} Stairs: {opstairs:27293} Has following equipment at home: {Assistive devices:23999}  PLOF: {PLOF:24004}  PATIENT GOALS: ***  OBJECTIVE:  Note: Objective measures were completed at Evaluation unless otherwise noted.  DIAGNOSTIC FINDINGS: ***  COGNITION: Overall cognitive status: {cognition:24006}   SENSATION: {sensation:27233}  EDEMA:  {edema:24020}  MUSCLE TONE:  {LE tone:25568}  DTRs:  {DTR SITE:24025}  POSTURE:  {posture:25561}  Cervical ROM:    {AROM/PROM:27142} A/PROM (deg) eval  Flexion   Extension   Right lateral flexion   Left lateral flexion   Right rotation   Left rotation   (Blank rows = not tested)  STRENGTH: ***  LOWER EXTREMITY MMT:   MMT Right eval Left eval  Hip flexion    Hip abduction    Hip adduction    Hip internal rotation    Hip external rotation    Knee flexion    Knee extension    Ankle dorsiflexion    Ankle plantarflexion    Ankle inversion    Ankle eversion    (Blank rows = not tested)  BED MOBILITY:  {Bed  mobility:24027}  TRANSFERS: Assistive device utilized: {Assistive devices:23999}  Sit to stand: {Levels of assistance:24026} Stand to sit: {Levels of assistance:24026} Chair to chair: {Levels of assistance:24026} Floor: {Levels of assistance:24026}  RAMP: {Levels of assistance:24026}  CURB: {Levels of assistance:24026}  GAIT: Gait pattern: {gait characteristics:25376} Distance walked: *** Assistive device utilized: {Assistive devices:23999} Level of assistance: {Levels of assistance:24026} Comments: ***  FUNCTIONAL TESTS:  {Functional tests:24029}  PATIENT SURVEYS:  {rehab surveys:24030}  VESTIBULAR ASSESSMENT:  GENERAL OBSERVATION: ***   SYMPTOM BEHAVIOR:  Subjective history: ***  Non-Vestibular symptoms: {nonvestibular symptoms:25260}  Type of dizziness: {Type of Dizziness:25255}  Frequency: ***  Duration: ***  Aggravating factors: {Aggravating Factors:25258}  Relieving factors: {Relieving Factors:25259}  Progression of symptoms: {DESC; BETTER/WORSE:18575}  OCULOMOTOR EXAM:  Ocular Alignment: {Ocular Alignment:25262}  Ocular ROM: {RANGE OF MOTION:21649}  Spontaneous Nystagmus: {Spontaneous nystagmus:25263}  Gaze-Induced Nystagmus: {gaze-induced nystagmus:25264}  Smooth Pursuits: {smooth pursuit:25265}  Saccades: {saccades:25266}  Convergence/Divergence: *** cm   FRENZEL - FIXATION SUPRESSED:  Ocular Alignment: {Ocular Alignment:25262}  Spontaneous Nystagmus: {Spontaneous nystagmus:25263}  Gaze-Induced Nystagmus: {gaze-induced nystagmus:25264}  Horizontal head shaking - induced nystagmus: {head shaking induced nystagmus:25267}  Vertical head shaking - induced nystagmus: {head shaking induced nystagmus:25267}  Positional tests: {Positional tests:25271}  Pressure tests: {frenzel pressure tests:25268}  VESTIBULAR - OCULAR REFLEX:   Slow VOR: {slow VOR:25290}  VOR Cancellation: {vor cancellation:25291}  Head-Impulse Test: {head impulse test:25272}  Dynamic  Visual Acuity: {dynamic visual acuity:25273}   POSITIONAL TESTING: {Positional tests:25271}  MOTION SENSITIVITY:  Motion Sensitivity Quotient Intensity: 0 = none, 1 = Lightheaded, 2 = Mild, 3 = Moderate, 4 = Severe, 5 = Vomiting  Intensity  1. Sitting to supine   2. Supine to L side   3. Supine to R side   4. Supine to sitting   5. L Hallpike-Dix   6. Up from L    7. R Hallpike-Dix   8. Up from R    9. Sitting, head tipped to L knee   10. Head up from L knee   11. Sitting, head tipped to R knee   12. Head up from R knee   13. Sitting head turns x5   14.Sitting head nods x5   15. In stance, 180 turn to L    16. In stance, 180 turn to R     OTHOSTATICS: {Exam; orthostatics:31331}  FUNCTIONAL GAIT: {Functional tests:24029}   VESTIBULAR TREATMENT:                                                                                                   DATE: ***  Canalith Repositioning:  {Canalith Repositioning:25283} Gaze Adaptation:  {gaze adaptation:25286} Habituation:  {habituation:25288} Other: ***  PATIENT EDUCATION: Education details: *** Person educated: {Person educated:25204} Education method: {Education Method:25205} Education comprehension: {Education Comprehension:25206}  HOME EXERCISE PROGRAM:  GOALS: Goals reviewed with patient? {yes/no:20286}  SHORT TERM GOALS: Target date: ***  *** Baseline: Goal status: {GOALSTATUS:25110}  2.  *** Baseline:  Goal status: {GOALSTATUS:25110}  3.  *** Baseline:  Goal status: {GOALSTATUS:25110}  4.  *** Baseline:  Goal status: {GOALSTATUS:25110}  5.  *** Baseline:  Goal status: {GOALSTATUS:25110}  6.  *** Baseline:  Goal status: {GOALSTATUS:25110}  LONG TERM GOALS: Target date: ***  *** Baseline:  Goal status: {GOALSTATUS:25110}  2.  *** Baseline:  Goal status: {GOALSTATUS:25110}  3.  *** Baseline:  Goal status: {GOALSTATUS:25110}  4.  *** Baseline:  Goal status: {GOALSTATUS:25110}  5.   *** Baseline:  Goal status: {GOALSTATUS:25110}  6.  *** Baseline:  Goal status: {GOALSTATUS:25110}  ASSESSMENT:  CLINICAL IMPRESSION: Patient is a *** y.o. *** who was seen today for physical therapy evaluation and treatment for ***.   OBJECTIVE IMPAIRMENTS: {opptimpairments:25111}.   ACTIVITY LIMITATIONS: {activitylimitations:27494}  PARTICIPATION LIMITATIONS: {participationrestrictions:25113}  PERSONAL FACTORS: {Personal factors:25162} are also affecting patient's functional outcome.   REHAB POTENTIAL: {rehabpotential:25112}  CLINICAL DECISION MAKING: {clinical decision making:25114}  EVALUATION COMPLEXITY: {Evaluation complexity:25115}   PLAN:  PT FREQUENCY: {rehab frequency:25116}  PT DURATION: {rehab duration:25117}  PLANNED INTERVENTIONS: {rehab  planned interventions:25118::"97110-Therapeutic exercises","97530- Therapeutic [phone]- Neuromuscular re-education","97535- Self DGLO","75643- Manual therapy"}  PLAN FOR NEXT SESSION: Virgina Organ, PT CLT [phone] 05/12/2023, 8:30 AM

## 2023-05-12 NOTE — Therapy (Signed)
[name] OUTPATIENT PHYSICAL THERAPY NEURO Treatment   Patient Name: [name] MRN: [id number] DOB:1941-06-12, 82 y.o., female Today's Date: 05/12/2023   PCP: [name], MDPCP - General  REFERRING PROVIDER: Caren Hazy Provider   END OF SESSION:   PT End of Session - 05/12/23 1124     Visit Number 3    Number of Visits 12    Authorization Type [employer]    Authorization Time Period no auth no limit    PT Start Time 1108    PT Stop Time 1124    PT Time Calculation (min) 16 min              Past Medical History:  Diagnosis Date   Antral gastritis    EGD 11/15   Arthritis    Asthmatic bronchitis    Back pain    Breast cancer (HCC)    right breast   CAD in native artery 03/10/2021   Chronic diastolic heart failure (HCC) 05/20/2015   Grade 2 diastolic dysfunction.  04/2015.   Chronic kidney disease    kidney function low   COPD (chronic obstructive pulmonary disease) (HCC)    Diabetes mellitus    x 5 yrs   DVT of axillary vein, acute left (HCC) 07/24/2012   GERD (gastroesophageal reflux disease)    Glaucoma    POAG OU   Heart murmur    rheum fever at age 41   History of hiatal hernia    History of kidney stones    Hyperlipidemia 05/20/2015   Hypertension    Hypertensive retinopathy    OU   Hypothyroidism    Kidney stones    Macular degeneration    Wet OD, Dry OS   Mixed hyperlipidemia    OSA (obstructive sleep apnea) 12/17/2021   does not use cpap on regular basis   Peripheral venous insufficiency    Pinched nerve    right elbow   Pneumonia    PONV (postoperative nausea and vomiting)    Sigmoid diverticulitis    Snoring 03/10/2021   Vertigo    chonic   Past Surgical History:  Procedure Laterality Date   ABDOMINAL HYSTERECTOMY     BACK SURGERY     spinal    BREAST LUMPECTOMY WITH RADIOACTIVE SEED LOCALIZATION Right 12/03/2020   Procedure: RIGHT BREAST LUMPECTOMY WITH RADIOACTIVE SEED LOCALIZATION;  Surgeon: Manus Rudd, MD;  Location: [facility] OR;  Service: General;  Laterality: Right;   CARDIAC CATHETERIZATION N/A 05/26/2015   Procedure: Left Heart Cath and Coronary Angiography;  Surgeon: Corky Crafts, MD;  Location: [facility] INVASIVE CV LAB;  Service: Cardiovascular;  Laterality: N/A;   CATARACT EXTRACTION Bilateral    CHOLECYSTECTOMY     COLON SURGERY     COLONOSCOPY N/A 12/27/2013   Procedure: COLONOSCOPY;  Surgeon: Malissa Hippo, MD;  [facility]: [facility];  Service: Endoscopy;  Laterality: N/A;  200   COLOSTOMY CLOSURE     CYSTOSCOPY/URETEROSCOPY/HOLMIUM LASER/STENT PLACEMENT Right 11/24/2022   Procedure: CYSTOSCOPY RIGHT URETEROSCOPY/HOLMIUM LASER/STENT PLACEMENT;  Surgeon: Crista Elliot, MD;  Location: [facility];  Service: Urology;  Laterality: Right;  60 MINS FOR CASE   ENDARTERECTOMY Left 03/10/2022   Procedure: LEFT CAROTID ENDARTERECTOMY;  Surgeon: Leonie Douglas, MD;  Location: [facility] OR;  Service: Vascular;  Laterality: Left;   ESOPHAGOGASTRODUODENOSCOPY N/A 05/07/2014   Procedure: ESOPHAGOGASTRODUODENOSCOPY (EGD);  Surgeon: Malissa Hippo, MD;  [facility];  Service: Endoscopy;  Laterality: N/A;  EYE SURGERY Bilateral    Cat Sx   fracture left foot     HERNIA REPAIR     NM MYOCAR PERF WALL MOTION  01/28/2009   Normal   OTHER SURGICAL HISTORY     colostomy, colostomy reversal, for diverticulitis surgical hernia repair, arm surgery, neck surgery   PATCH ANGIOPLASTY Left 03/10/2022   Procedure: PATCH ANGIOPLASTY WITH 1X6CM Kathleen Lime;  Surgeon: Leonie Douglas, MD;  Location: [facility] OR;  Service: Vascular;  Laterality: Left;   US ECHOCARDIOGRAPHY  02/11/2010   Mild MR,trace TR & AI   Patient Active Problem List   Diagnosis Date Noted   Carotid stenosis 03/10/2022   Carotid stenosis, asymptomatic 03/10/2022   OSA (obstructive sleep apnea) 12/17/2021   CAD in native artery 03/10/2021   Snoring 03/10/2021   Ductal carcinoma in situ (DCIS) of right breast 12/29/2020   Long  term (current) use of anticoagulants 06/24/2020   Medication management 07/01/2016   Abnormal nuclear stress test    Chronic diastolic heart failure (HCC) 05/20/2015   Hyperlipidemia 05/20/2015   Annual physical exam 06/13/2014   Antral gastritis 05/07/2014   Anemia 05/06/2014   Renal failure 05/06/2014   Type 2 diabetes mellitus without complication (HCC)    RUQ pain 05/03/2014   Rectal bleeding 12/10/2013   Pelvic mass in female 11/06/2013   Epistaxis 08/19/2012   DVT (deep venous thrombosis) (HCC) 08/19/2012   DM (diabetes mellitus) (HCC) 08/19/2012   HTN (hypertension) 08/19/2012    ONSET DATE: 1 year+  REFERRING DIAG:  Diagnosis  R26.81 (ICD-10-CM) - Gait instability    THERAPY DIAG:  Unsteadiness on feet  Repeated falls  Rationale for Evaluation and Treatment: Rehabilitation  SUBJECTIVE:                                                                                                                                                                                             SUBJECTIVE STATEMENT: PT states that she is doing her exercises.  She continues to be dizzy.  She is having headaches.      EVAL: Patient had a stent surgically implanted last year for the right eye due to severe pressure/ macular degeneration. Patient reports she began to falling in May 2024; patient fell to the right. More recently, patient had 1-2 falls within the last few weeks. Patient fell in the house about 2 weeks ago; back has been hurting ever since. Patient describes her falls as instant; states she doesn't feel sensation that she is losing balance beforehand. Describes her falls as: "next thing you know, I am on the floor" without knowing how she  fell. Patient had a cardiologist appointment recently and explained her increased # of falls and MD referred patient to OPPT.    PERTINENT HISTORY:   Right eye stent 2023   CHF  Chronic Vertigo   PAIN:  Are you having pain? Yes: NPRS  scale: 5/10 Pain location: lumbar  Pain description: achy Aggravating factors: walking, bending Relieving factors: rest  PRECAUTIONS: None  RED FLAGS: None   WEIGHT BEARING RESTRICTIONS: No  FALLS: Has patient fallen in last 6 months? Yes. Number of falls 3  LIVING ENVIRONMENT: Lives with: lives alone Lives in: House/apartment Stairs: Yes: External: 6 steps; can reach both Has following equipment at home: Single point cane and Walker - 2 wheeled  PLOF: Independent   PATIENT GOALS: to improve her balance and decreased falls   OBJECTIVE:   DIAGNOSTIC FINDINGS: n/a  COGNITION: Overall cognitive status: Within functional limits for tasks assessed    SENSATION: WFL  DTRs:  Patella 2+ = Normal  COORDINATION:  WFL for Finger to Nose, Heel to Shin, Alternating hands on knees  POSTURE: decreased lumbar lordosis   FUNCTIONAL TESTS:  Timed up and go (TUG): 29.60 seconds with contact guard assist; Mod unsteadiness during turn around cone, reports dizziness    Dynamic Gait Index = 12  Interpretation: This result is consistent with an increased fall risk.    GAIT ANALYSIS: Gait pattern: decreased step length- Right, decreased step length- Left, and decreased stride length Distance walked: 59feet Assistive device utilized: None Level of assistance: CGA Comments: Patient      LOWER EXTREMITY MMT:    MMT Right Eval Left Eval  Hip flexion 3/5 3/5  Hip extension    Hip abduction    Hip adduction    Hip internal rotation    Hip external rotation    Knee flexion 4-/5 4-/5  Knee extension 4-/5 4-/5  Ankle dorsiflexion 3+/5 3+/5  Ankle plantarflexion    Ankle inversion    Ankle eversion    (Blank rows = not tested)   MUSCLE TONE: WFL   STAIRS: Level of Assistance: CGA Stair Negotiation Technique: Alternating Pattern  with Bilateral Rails Number of Stairs: 5  Height of Stairs: standard  Comments: Patient with MILD unsteadiness on stairs; able to  reciprocal gait up/down stairs with unilateral handrail assistance     TODAY'S TREATMENT:                                                                                                                              DATE:  05/12/23 Sit to stand x 10 Head turns x 5 quick Head nodes x 5 quick  BP checked due to complaint of headache:  193/104 ; completed manually 190/100  Session stopped and pt advised to call her MD>  05/06/23:  Heel raise x 10 Squat x 10  Tandem stance :  5x each with Rt in front then Lt Sitting: Head turns x 3  Head nods x  3   05/05/23  PT Initial Evaluation     PATIENT EDUCATION: Education details: HEP Person educated: Patient Education method: Medical illustrator Education comprehension: verbalized understanding  HOME EXERCISE PROGRAM: Access Code: [email or web address] URL: [email or web address] Date: 05/06/2023 Prepared by: Virgina Organ  Exercises - Heel Raises with Counter Support  - 2 x daily - 7 x weekly - 1 sets - 10 reps - 3 seconds  hold - Mini Squat with Counter Support  - 2 x daily - 7 x weekly - 1 sets - 10 reps - 3 seconds  hold - Standing Tandem Balance with Counter Support  - 2 x daily - 7 x weekly - 1 sets - 10 reps - up to 15 seconds  hold - Seated Left Head Turns Vestibular Habituation  - 2 x daily - 7 x weekly - 1 sets - 10 reps - Seated Head Nods Vestibular Habituation  - 2 x daily - 7 x weekly - 1 sets - 10 reps GOALS: Goals reviewed with patient? No  SHORT TERM GOALS: Target date: 05/26/2023   1.  Patient will be able to complete the TUG(Timed Up and Go) Test within 25 seconds with contact guard assist to improve overall gait speed and decrease Falls Risk  Baseline: 29.5 contact guard assist  Goal status:on-Going   2.  Patient will be independent with a basic stretching/strengthening HEP Baseline:  Goal status: on-Going   LONG TERM GOALS: Target date: 06/16/2023    Patient will be able to complete the  TUG(Timed Up and Go) Test within 20 seconds with contact guard assist to improve overall gait speed and decrease Falls Risk  Baseline: 29.5 contact guard assist  Goal status: on-Going  2.    Patient will be able to score >/= 16 on the Dynamic Gait Index to improve overall gait speed and decrease Falls Risk  Baseline: 13 Goal status: on-Going  3.   Patient will be independent with a comprehensive strengthening HEP with 1-2/10 low back pain  Baseline:  Goal status: on-Going  ASSESSMENT:  CLINICAL IMPRESSION: Therapist began treatment, however, pt then began complaining of headache.  States this has been going on for days.  PT BP taken which was 190/100.  Session ended and pt states she is going directly to her MD> .  OBJECTIVE IMPAIRMENTS: Abnormal gait, decreased balance, decreased endurance, difficulty walking, decreased strength, dizziness, impaired flexibility, postural dysfunction, and pain.   ACTIVITY LIMITATIONS: carrying, lifting, bending, standing, squatting, stairs, bed mobility, and locomotion level  PARTICIPATION LIMITATIONS: meal prep, cleaning, laundry, interpersonal relationship, driving, shopping, community activity, and yard work  PERSONAL FACTORS: Age are also affecting patient's functional outcome.   REHAB POTENTIAL: Fair chronic unsteadiness 1+ year  CLINICAL DECISION MAKING: Stable/uncomplicated  EVALUATION COMPLEXITY: Moderate  PLAN:  PT FREQUENCY: 1-2x/week  PT DURATION: 6 weeks  PLANNED INTERVENTIONS: 97110-Therapeutic exercises, 97530- Therapeutic activity, 97112- Neuromuscular re-education, 97535- Self Care, 14782- Manual therapy, [phone]- Gait training, Balance training, Stair training, Dry Needling, Joint mobilization, Joint manipulation, Spinal manipulation, Spinal mobilization, Vestibular training, Visual/preceptual remediation/compensation, Cryotherapy, and Moist heat  PLAN FOR NEXT SESSION:  Begin marching, SLS, continue to habituate vestibular sx.   Progress to standing head nods and turns when able.   Virgina Organ, PT CLT [phone]  05/12/2023, 11:24 AM

## 2023-05-17 DIAGNOSIS — I10 Essential (primary) hypertension: Secondary | ICD-10-CM | POA: Diagnosis not present

## 2023-05-17 DIAGNOSIS — Z6828 Body mass index (BMI) 28.0-28.9, adult: Secondary | ICD-10-CM | POA: Diagnosis not present

## 2023-05-17 DIAGNOSIS — E663 Overweight: Secondary | ICD-10-CM | POA: Diagnosis not present

## 2023-05-17 DIAGNOSIS — E1159 Type 2 diabetes mellitus with other circulatory complications: Secondary | ICD-10-CM | POA: Diagnosis not present

## 2023-05-17 DIAGNOSIS — R030 Elevated blood-pressure reading, without diagnosis of hypertension: Secondary | ICD-10-CM | POA: Diagnosis not present

## 2023-05-18 ENCOUNTER — Telehealth (HOSPITAL_COMMUNITY): Payer: Self-pay | Admitting: Physical Therapy

## 2023-05-18 ENCOUNTER — Encounter (HOSPITAL_COMMUNITY): Payer: PPO | Admitting: Physical Therapy

## 2023-05-18 NOTE — Telephone Encounter (Signed)
First no show:  Called pt and left a message.  Let pt know when her next appointment was and to please call if she was not going to make this appointment.    Virgina Organ, PT CLT [phone]

## 2023-05-20 ENCOUNTER — Encounter (HOSPITAL_COMMUNITY): Payer: PPO

## 2023-05-23 ENCOUNTER — Other Ambulatory Visit: Payer: Self-pay | Admitting: Cardiovascular Disease

## 2023-05-25 ENCOUNTER — Ambulatory Visit: Payer: PPO | Attending: Cardiology

## 2023-05-25 DIAGNOSIS — Z5181 Encounter for therapeutic drug level monitoring: Secondary | ICD-10-CM | POA: Diagnosis not present

## 2023-05-25 DIAGNOSIS — I82A12 Acute embolism and thrombosis of left axillary vein: Secondary | ICD-10-CM | POA: Diagnosis not present

## 2023-05-25 DIAGNOSIS — Z7901 Long term (current) use of anticoagulants: Secondary | ICD-10-CM | POA: Diagnosis not present

## 2023-05-25 LAB — POCT INR: INR: 2.7 (ref 2.0–3.0)

## 2023-05-25 NOTE — Patient Instructions (Signed)
Description   Continue on same dosage of Warfarin 1 tablet daily   Recheck INR in 4 wk.  Call Coumadin clinic for any questions or changes in medications.

## 2023-06-06 ENCOUNTER — Encounter (HOSPITAL_COMMUNITY): Payer: Self-pay | Admitting: *Deleted

## 2023-06-06 ENCOUNTER — Emergency Department (HOSPITAL_COMMUNITY)
Admission: EM | Admit: 2023-06-06 | Discharge: 2023-06-07 | Disposition: A | Discharge status: Home / Self Care | Payer: PPO | Attending: Emergency Medicine | Admitting: Emergency Medicine

## 2023-06-06 ENCOUNTER — Other Ambulatory Visit: Payer: Self-pay

## 2023-06-06 ENCOUNTER — Emergency Department (HOSPITAL_COMMUNITY): Payer: PPO

## 2023-06-06 ENCOUNTER — Telehealth: Payer: Self-pay | Admitting: Cardiovascular Disease

## 2023-06-06 DIAGNOSIS — R079 Chest pain, unspecified: Secondary | ICD-10-CM | POA: Diagnosis not present

## 2023-06-06 DIAGNOSIS — R0602 Shortness of breath: Secondary | ICD-10-CM | POA: Diagnosis not present

## 2023-06-06 DIAGNOSIS — I2510 Atherosclerotic heart disease of native coronary artery without angina pectoris: Secondary | ICD-10-CM | POA: Insufficient documentation

## 2023-06-06 DIAGNOSIS — Y92007 Garden or yard of unspecified non-institutional (private) residence as the place of occurrence of the external cause: Secondary | ICD-10-CM | POA: Diagnosis not present

## 2023-06-06 DIAGNOSIS — E1122 Type 2 diabetes mellitus with diabetic chronic kidney disease: Secondary | ICD-10-CM | POA: Insufficient documentation

## 2023-06-06 DIAGNOSIS — S2232XA Fracture of one rib, left side, initial encounter for closed fracture: Secondary | ICD-10-CM | POA: Insufficient documentation

## 2023-06-06 DIAGNOSIS — N189 Chronic kidney disease, unspecified: Secondary | ICD-10-CM | POA: Insufficient documentation

## 2023-06-06 DIAGNOSIS — W1809XA Striking against other object with subsequent fall, initial encounter: Secondary | ICD-10-CM | POA: Diagnosis not present

## 2023-06-06 DIAGNOSIS — W19XXXA Unspecified fall, initial encounter: Secondary | ICD-10-CM

## 2023-06-06 DIAGNOSIS — I129 Hypertensive chronic kidney disease with stage 1 through stage 4 chronic kidney disease, or unspecified chronic kidney disease: Secondary | ICD-10-CM | POA: Insufficient documentation

## 2023-06-06 DIAGNOSIS — J9811 Atelectasis: Secondary | ICD-10-CM | POA: Diagnosis not present

## 2023-06-06 DIAGNOSIS — E119 Type 2 diabetes mellitus without complications: Secondary | ICD-10-CM | POA: Insufficient documentation

## 2023-06-06 DIAGNOSIS — R229 Localized swelling, mass and lump, unspecified: Secondary | ICD-10-CM | POA: Diagnosis not present

## 2023-06-06 DIAGNOSIS — I700 Atherosclerosis of aorta: Secondary | ICD-10-CM | POA: Diagnosis not present

## 2023-06-06 DIAGNOSIS — S298XXA Other specified injuries of thorax, initial encounter: Secondary | ICD-10-CM | POA: Diagnosis not present

## 2023-06-06 LAB — PROTIME-INR
INR: 2.8 — ABNORMAL HIGH (ref 0.8–1.2)
Prothrombin Time: 29.3 s — ABNORMAL HIGH (ref 11.4–15.2)

## 2023-06-06 LAB — BASIC METABOLIC PANEL
Anion gap: 10 (ref 5–15)
BUN: 13 mg/dL (ref 8–23)
CO2: 27 mmol/L (ref 22–32)
Calcium: 10 mg/dL (ref 8.9–10.3)
Chloride: 106 mmol/L (ref 98–111)
Creatinine, Ser: 0.84 mg/dL (ref 0.44–1.00)
GFR, Estimated: >60 mL/min (ref >60)
Glucose, Bld: 125 mg/dL — ABNORMAL HIGH (ref 70–99)
Potassium: 3.5 mmol/L (ref 3.5–5.1)
Sodium: 143 mmol/L (ref 135–145)

## 2023-06-06 LAB — BRAIN NATRIURETIC PEPTIDE: B Natriuretic Peptide: 37 pg/mL (ref 0.0–100.0)

## 2023-06-06 NOTE — Telephone Encounter (Signed)
With recent fall patient needs to see PCP/urgent care/ED  Takes Warfarin   Left message to call back

## 2023-06-06 NOTE — Telephone Encounter (Signed)
Patient returned RN's call. Advised her she will have to see PCP/urgent care/ ED.  Patient verbalized understanding.

## 2023-06-06 NOTE — Telephone Encounter (Signed)
Pt called in stating she fell and she is now having pain on her chest, its bruised, and she has knot on it. States she had a little SOB (did not sound SOB on the phone). Please advise.

## 2023-06-06 NOTE — ED Triage Notes (Signed)
Pt fell a week ago, pt with bruising to left upper arm and left chest. Pt seen at [facility] and sent here for MRI, chest xray done and per pt showed a mass. Pt fell in a family member's yard and hit bird bath with a piece of wood. Hurts with taking a deep breath.

## 2023-06-07 ENCOUNTER — Other Ambulatory Visit: Payer: Self-pay

## 2023-06-07 ENCOUNTER — Emergency Department (HOSPITAL_COMMUNITY): Payer: PPO

## 2023-06-07 DIAGNOSIS — J9811 Atelectasis: Secondary | ICD-10-CM | POA: Diagnosis not present

## 2023-06-07 DIAGNOSIS — S2232XA Fracture of one rib, left side, initial encounter for closed fracture: Secondary | ICD-10-CM | POA: Diagnosis not present

## 2023-06-07 DIAGNOSIS — I700 Atherosclerosis of aorta: Secondary | ICD-10-CM | POA: Diagnosis not present

## 2023-06-07 LAB — TROPONIN I (HIGH SENSITIVITY): Troponin I (High Sensitivity): 8 ng/L (ref <18)

## 2023-06-07 MED ORDER — LIDOCAINE 5 % EX PTCH: 1.0000 | MEDICATED_PATCH | CUTANEOUS | 0 refills | Status: AC

## 2023-06-07 MED ORDER — LIDOCAINE 5 % EX PTCH
1.0000 | MEDICATED_PATCH | CUTANEOUS | Status: DC
  Administered 2023-06-07: 1 via TRANSDERMAL
  Filled 2023-06-07: qty 1

## 2023-06-07 MED ORDER — IOHEXOL 350 MG/ML SOLN
75.0000 mL | Freq: Once | INTRAVENOUS | Status: AC | PRN
Start: 2023-06-07 — End: 2023-06-07
  Administered 2023-06-07: 75 mL via INTRAVENOUS

## 2023-06-07 MED ORDER — OXYCODONE-ACETAMINOPHEN 5-325 MG PO TABS: 1.0000 | ORAL_TABLET | Freq: Four times a day (QID) | ORAL | 0 refills | Status: AC | PRN

## 2023-06-07 MED ORDER — OXYCODONE-ACETAMINOPHEN 5-325 MG PO TABS
1.0000 | ORAL_TABLET | Freq: Once | ORAL | Status: AC
  Administered 2023-06-07: 1 via ORAL
  Filled 2023-06-07: qty 1

## 2023-06-07 MED ORDER — HYDROMORPHONE HCL 1 MG/ML IJ SOLN
0.5000 mg | Freq: Once | INTRAMUSCULAR | Status: AC
  Administered 2023-06-07: 0.5 mg via INTRAVENOUS
  Filled 2023-06-07: qty 0.5

## 2023-06-07 NOTE — Discharge Instructions (Signed)
You have a fracture of your left second rib.  This would explain the discomfort that you have been having over the past week.  Treatment is pain control and time.  Prescriptions for lidocaine patches and narcotic pain medication were sent to your pharmacy.  Take as needed for pain.  Ensure that you are taking deep breaths throughout the day to avoid developing a pneumonia.  Return to the emergency department for any new or worsening symptoms of concern.

## 2023-06-07 NOTE — ED Notes (Addendum)
Patient desatted to 82% Spo2 on RA post dilaudid administration. Placed on 2 L of O2 with improvement to 94% Spo2 on 2 L of O2 per [place]. [place] MD notified.

## 2023-06-07 NOTE — ED Provider Notes (Signed)
[place] EMERGENCY DEPARTMENT AT [facility] Provider Note   CSN: [id number] Arrival date & time: 06/06/23  1541     History {Add pertinent medical, surgical, social history, OB history to HPI:1} Chief Complaint  Patient presents with   [name] is a 82 y.o. female.   Fall  Patient presents for***.  Medical history include DM, HTN, anemia, HLD, breast cancer, CAD, carotid stenosis, CKD.  She is on warfarin.  1 week ago, she had a mechanical fall while out in the yard.  As she fell, she struck a birdbath and piece of wood.  She has since had***.  She was seen at urgent care and told that a mass was identified on chest x-ray and that she needs an MRI.     Home Medications Prior to Admission medications   Medication Sig Start Date End Date Taking? Authorizing Provider  albuterol (PROVENTIL HFA;VENTOLIN HFA) 108 (90 BASE) MCG/ACT inhaler Inhale 2 puffs into the lungs every 4 (four) hours as needed for shortness of breath. 06/04/15   Chilton Si, MD  albuterol (PROVENTIL) (2.5 MG/3ML) 0.083% nebulizer solution Take 2.5 mg by nebulization every 6 (six) hours as needed for wheezing or shortness of breath.    [provider]  Alirocumab (PRALUENT) 75 MG/ML SOAJ Inject 1 mL (75 mg total) into the skin every 14 (fourteen) days. 03/09/23   Chilton Si, MD  amitriptyline (ELAVIL) 25 MG tablet Take 25 mg by mouth at bedtime.    [provider]  anastrozole (ARIMIDEX) 1 MG tablet Take 1 tablet (1 mg total) by mouth at bedtime. 04/13/23   [name], MD  carvedilol (COREG) 6.25 MG tablet TAKE ONE TABLET BY MOUTH TWICE DAILY 02/23/23   Chilton Si, MD  cetirizine (ZYRTEC) 10 MG tablet Take 10 mg by mouth daily.    [provider]  Cholecalciferol (VITAMIN D-3) 1000 units CAPS Take 1,000 Units by mouth daily.    [provider]  diazepam (VALIUM) 2 MG tablet Take 2 mg by mouth 2 (two) times daily as needed for anxiety  (prior to eye injections). 04/10/20   [provider]  dorzolamide-timolol (COSOPT) 22.3-6.8 MG/ML ophthalmic solution INSTILL 1 DROP INTO RIGHT EYE TWICE A DAY Patient taking differently: Place 1 drop into the right eye 2 (two) times daily. 08/28/20   Rennis Chris, MD  esomeprazole (NEXIUM) 20 MG capsule Take 20 mg by mouth daily at 12 noon.    [provider]  FARXIGA 5 MG TABS tablet Take 5 mg by mouth every morning. 10/01/20   [provider]  fluticasone (FLOVENT HFA) 110 MCG/ACT inhaler Inhale 1 puff into the lungs 2 (two) times daily. Rinse mouth with water after each use 06/06/22   Particia Nearing, PA-C  furosemide (LASIX) 40 MG tablet TAKE 1 TABLET (40 MG TOTAL) BY MOUTH DAILY. MAY TAKE EXTRA DAILY AS NEEDED FOR SWELLING Patient taking differently: Take 40 mg by mouth daily. 04/15/21   Chilton Si, MD  gabapentin (NEURONTIN) 300 MG capsule Take 300 mg by mouth 3 (three) times daily as needed (pain). 05/08/19   [provider]  glimepiride (AMARYL) 2 MG tablet Take 2 mg by mouth daily. 08/26/17   [provider]  hydrALAZINE (APRESOLINE) 25 MG tablet TAKE 1 TABLET BY MOUTH TWO TIMES DAILY AT 8 AM AND 10 PM. 05/23/23   Chilton Si, MD  isosorbide mononitrate (IMDUR) 30 MG 24 hr tablet TAKE ONE TABLET BY MOUTH ONCE  DAILY 02/03/23   Chilton Si, MD  latanoprost (XALATAN) 0.005 % ophthalmic solution Place 1 drop into the left eye at bedtime.    [provider]  meclizine (ANTIVERT) 25 MG tablet Take 25 mg by mouth 2 (two) times daily as needed for dizziness.    [provider]  Multiple Vitamins-Minerals (PRESERVISION AREDS 2 PO) Take 1 capsule by mouth in the morning and at bedtime.    [provider]  NON FORMULARY CBD Gummy    [provider]  Omega-3 Fatty Acids (FISH OIL) 1200 MG CAPS Take 1,200 mg by mouth 2 (two) times daily.    [provider]  ondansetron (ZOFRAN-ODT) 4 MG  disintegrating tablet Take 1 tablet (4 mg total) by mouth every 8 (eight) hours as needed. 11/15/22   Jacalyn Lefevre, MD  Polyethyl Glycol-Propyl Glycol (SYSTANE OP) Place 1 drop into the left eye daily as needed (dry eye).    [provider]  polyethylene glycol (MIRALAX / GLYCOLAX) packet Take 17 g by mouth daily as needed for moderate constipation.    [provider]  potassium chloride SA (KLOR-CON M) 20 MEQ tablet TAKE ONE-HALF TABLET BY  MOUTH DAILY Patient taking differently: Take 10 mEq by mouth See admin instructions. Only takes when taking 08/11/21   Chilton Si, MD  promethazine-dextromethorphan (PROMETHAZINE-DM) 6.25-15 MG/5ML syrup Take 5 mLs by mouth 4 (four) times daily as needed. 06/06/22   Particia Nearing, PA-C  tamsulosin (FLOMAX) 0.4 MG CAPS capsule Take 1 capsule (0.4 mg total) by mouth daily. 11/15/22   Jacalyn Lefevre, MD  vitamin B-12 (CYANOCOBALAMIN) 100 MCG tablet Take 100 mcg by mouth daily. 03/04/14   [provider]  warfarin (COUMADIN) 2 MG tablet TAKE 1 TABLET BY MOUTH EVERY DAY AT BEDTIME OR AS DIRECTED BY THE COUMADIN CLINIC 04/01/23   Chilton Si, MD      Allergies    Alphagan [brimonidine], Diflunisal, Vioxx [rofecoxib], Metformin and related, Nexlizet [bempedoic acid-ezetimibe], Repatha [evolocumab], Codeine, Elemental sulfur, Motrin [ibuprofen], Penicillins, and Pravastatin    Review of Systems   Review of Systems  Physical Exam Updated Vital Signs BP (!) 159/97 (BP Location: Right Arm)   Pulse 74   Temp 98.2 F (36.8 C) (Oral)   Resp 18   Ht 5\' 2"  (1.575 m)   Wt 73.5 kg   SpO2 94%   BMI 29.63 kg/m  Physical Exam  ED Results / Procedures / Treatments   Labs (all labs ordered are listed, but only abnormal results are displayed) Labs Reviewed  BASIC METABOLIC PANEL - Abnormal; Notable for the following components:      Result Value   Glucose, Bld 125 (*)    All other components within normal limits   PROTIME-INR - Abnormal; Notable for the following components:   Prothrombin Time 29.3 (*)    INR 2.8 (*)    All other components within normal limits  BRAIN NATRIURETIC PEPTIDE    EKG None  Radiology DG Chest 2 View  Result Date: 06/06/2023 CLINICAL DATA:  Fall with chest pain EXAM: CHEST - 2 VIEW COMPARISON:  08/24/2021 FINDINGS: No acute airspace disease or pleural effusion. Normal cardiomediastinal silhouette with aortic atherosclerosis. No pneumothorax. Hardware in the cervical spine IMPRESSION: No active cardiopulmonary disease. Electronically Signed   By: Jasmine Pang M.D.   On: 06/06/2023 19:28    Procedures Procedures  {Document cardiac monitor, telemetry assessment procedure when appropriate:1}  Medications Ordered in ED Medications - No data to display  ED Course/ Medical Decision Making/ A&P   {   Click here for ABCD2, HEART and other calculatorsREFRESH Note before signing :1}                              Medical Decision Making  ***  {Document critical care time when appropriate:1} {Document review of labs and clinical decision tools ie heart score, Chads2Vasc2 etc:1}  {Document your independent review of radiology images, and any outside records:1} {Document your discussion with family members, caretakers, and with consultants:1} {Document social determinants of health affecting pt's care:1} {Document your decision making why or why not admission, treatments were needed:1} Final Clinical Impression(s) / ED Diagnoses Final diagnoses:  None    Rx / DC Orders ED Discharge Orders     None

## 2023-06-16 ENCOUNTER — Other Ambulatory Visit: Payer: Self-pay | Admitting: Cardiovascular Disease

## 2023-06-22 ENCOUNTER — Ambulatory Visit: Payer: PPO | Attending: Cardiology | Admitting: *Deleted

## 2023-06-22 DIAGNOSIS — I82A12 Acute embolism and thrombosis of left axillary vein: Secondary | ICD-10-CM

## 2023-06-22 DIAGNOSIS — Z5181 Encounter for therapeutic drug level monitoring: Secondary | ICD-10-CM | POA: Diagnosis not present

## 2023-06-22 LAB — POCT INR: INR: 2.6 (ref 2.0–3.0)

## 2023-06-22 NOTE — Progress Notes (Signed)
[facility] - Clinic Note  06/23/2023     CHIEF COMPLAINT Patient presents for Retina Follow Up  HISTORY OF PRESENT ILLNESS: [name] is a 82 y.o. female who presents to the clinic today for:  HPI     Retina Follow Up   Patient presents with  Wet AMD.  In right eye.  This started 10.  Duration of weeks.  I, the attending physician,  performed the HPI with the patient and updated documentation appropriately.        Comments   Patient here for 10 weeks retina follow up for exu ARMD OD. Patient states vision about the same. Some days sees better than other days. No eye pain.       Last edited by Rennis Chris, MD on 06/23/2023  8:32 AM.    Patient states she had another bad fall since she was here last, she states she fractured some ribs   Referring physician: [name], MD [address] [place],  [place] [address]  HISTORICAL INFORMATION:  Selected notes from the MEDICAL RECORD NUMBER Referred by Dr. Daisy Lazar for concern of SRF OD LEE: 11.27.20 (M. Cotter) [BCVA: OD: 20/80-- OS: 20/60-]  Ocular Hx-glaucoma (latanoprost)  PMH-DM    CURRENT MEDICATIONS: Current Outpatient Medications (Ophthalmic Drugs)  Medication Sig   dorzolamide-timolol (COSOPT) 22.3-6.8 MG/ML ophthalmic solution INSTILL 1 DROP INTO RIGHT EYE TWICE A DAY (Patient taking differently: Place 1 drop into the right eye 2 (two) times daily.)   latanoprost (XALATAN) 0.005 % ophthalmic solution Place 1 drop into the left eye at bedtime.   Polyethyl Glycol-Propyl Glycol (SYSTANE OP) Place 1 drop into the left eye daily as needed (dry eye).   No current facility-administered medications for this visit. (Ophthalmic Drugs)   Current Outpatient Medications (Other)  Medication Sig   albuterol (PROVENTIL HFA;VENTOLIN HFA) 108 (90 BASE) MCG/ACT inhaler Inhale 2 puffs into the lungs every 4 (four) hours as needed for shortness of breath.   albuterol (PROVENTIL) (2.5 MG/3ML)  0.083% nebulizer solution Take 2.5 mg by nebulization every 6 (six) hours as needed for wheezing or shortness of breath.   Alirocumab (PRALUENT) 75 MG/ML SOAJ Inject 1 mL (75 mg total) into the skin every 14 (fourteen) days.   amitriptyline (ELAVIL) 25 MG tablet Take 25 mg by mouth at bedtime.   anastrozole (ARIMIDEX) 1 MG tablet Take 1 tablet (1 mg total) by mouth at bedtime.   carvedilol (COREG) 6.25 MG tablet TAKE ONE TABLET BY MOUTH TWICE DAILY   cetirizine (ZYRTEC) 10 MG tablet Take 10 mg by mouth daily.   Cholecalciferol (VITAMIN D-3) 1000 units CAPS Take 1,000 Units by mouth daily.   diazepam (VALIUM) 2 MG tablet Take 2 mg by mouth 2 (two) times daily as needed for anxiety (prior to eye injections).   esomeprazole (NEXIUM) 20 MG capsule Take 20 mg by mouth daily at 12 noon.   FARXIGA 5 MG TABS tablet Take 5 mg by mouth every morning.   fluticasone (FLOVENT HFA) 110 MCG/ACT inhaler Inhale 1 puff into the lungs 2 (two) times daily. Rinse mouth with water after each use   furosemide (LASIX) 40 MG tablet TAKE 1 TABLET (40 MG TOTAL) BY MOUTH DAILY. MAY TAKE EXTRA DAILY AS NEEDED FOR SWELLING (Patient taking differently: Take 40 mg by mouth daily.)   gabapentin (NEURONTIN) 300 MG capsule Take 300 mg by mouth 3 (three) times daily as needed (pain).   glimepiride (AMARYL) 2 MG tablet Take 2  mg by mouth daily.   hydrALAZINE (APRESOLINE) 25 MG tablet TAKE 1 TABLET BY MOUTH TWO TIMES DAILY AT 8 AM AND 10 PM.   isosorbide mononitrate (IMDUR) 30 MG 24 hr tablet TAKE ONE TABLET BY MOUTH ONCE DAILY   meclizine (ANTIVERT) 25 MG tablet Take 25 mg by mouth 2 (two) times daily as needed for dizziness.   Multiple Vitamins-Minerals (PRESERVISION AREDS 2 PO) Take 1 capsule by mouth in the morning and at bedtime.   NON FORMULARY CBD Gummy   Omega-3 Fatty Acids (FISH OIL) 1200 MG CAPS Take 1,200 mg by mouth 2 (two) times daily.   ondansetron (ZOFRAN-ODT) 4 MG disintegrating tablet Take 1 tablet (4 mg total) by  mouth every 8 (eight) hours as needed.   polyethylene glycol (MIRALAX / GLYCOLAX) packet Take 17 g by mouth daily as needed for moderate constipation.   potassium chloride SA (KLOR-CON M) 20 MEQ tablet TAKE ONE-HALF TABLET BY  MOUTH DAILY (Patient taking differently: Take 10 mEq by mouth See admin instructions. Only takes when taking)   promethazine-dextromethorphan (PROMETHAZINE-DM) 6.25-15 MG/5ML syrup Take 5 mLs by mouth 4 (four) times daily as needed.   tamsulosin (FLOMAX) 0.4 MG CAPS capsule Take 1 capsule (0.4 mg total) by mouth daily.   vitamin B-12 (CYANOCOBALAMIN) 100 MCG tablet Take 100 mcg by mouth daily.   warfarin (COUMADIN) 2 MG tablet TAKE 1 TABLET BY MOUTH EVERY DAY AT BEDTIME OR AS DIRECTED BY THE COUMADIN CLINIC   No current facility-administered medications for this visit. (Other)   REVIEW OF SYSTEMS: ROS   Positive for: Genitourinary, Endocrine, Cardiovascular, Eyes Negative for: Constitutional, Gastrointestinal, Neurological, Skin, Musculoskeletal, HENT, Respiratory, Psychiatric, Allergic/Imm, Heme/Lymph Last edited by Laddie Aquas, COA on 06/23/2023  7:42 AM.     ALLERGIES Allergies  Allergen Reactions   Alphagan [Brimonidine] Itching   Diflunisal Swelling    Other reaction(s): ENTIRE BODY SWELLING   Vioxx [Rofecoxib] Shortness Of Breath   Metformin And Related     Kidney failure   Nexlizet [Bempedoic Acid-Ezetimibe]     Causes elevated Liver and Kidney function   Repatha [Evolocumab]     MYALGIAS   Codeine Rash   Elemental Sulfur Rash   Motrin [Ibuprofen] Rash   Penicillins Rash   Pravastatin Rash   PAST MEDICAL HISTORY Past Medical History:  Diagnosis Date   Antral gastritis    EGD 11/15   Arthritis    Asthmatic bronchitis    Back pain    Breast cancer (HCC)    right breast   CAD in native artery 03/10/2021   Chronic diastolic heart failure (HCC) 05/20/2015   Grade 2 diastolic dysfunction.  04/2015.   Chronic kidney disease    kidney  function low   COPD (chronic obstructive pulmonary disease) (HCC)    Diabetes mellitus    x 5 yrs   DVT of axillary vein, acute left (HCC) 07/24/2012   GERD (gastroesophageal reflux disease)    Glaucoma    POAG OU   Heart murmur    rheum fever at age 87   History of hiatal hernia    History of kidney stones    Hyperlipidemia 05/20/2015   Hypertension    Hypertensive retinopathy    OU   Hypothyroidism    Kidney stones    Macular degeneration    Wet OD, Dry OS   Mixed hyperlipidemia    OSA (obstructive sleep apnea) 12/17/2021   does not use cpap on regular basis   Peripheral venous  insufficiency    Pinched nerve    right elbow   Pneumonia    PONV (postoperative nausea and vomiting)    Sigmoid diverticulitis    Snoring 03/10/2021   Vertigo    chonic   Past Surgical History:  Procedure Laterality Date   ABDOMINAL HYSTERECTOMY     BACK SURGERY     spinal    BREAST LUMPECTOMY WITH RADIOACTIVE SEED LOCALIZATION Right 12/03/2020   Procedure: RIGHT BREAST LUMPECTOMY WITH RADIOACTIVE SEED LOCALIZATION;  Surgeon: Manus Rudd, MD;  Location: [facility] OR;  Service: General;  Laterality: Right;   CARDIAC CATHETERIZATION N/A 05/26/2015   Procedure: Left Heart Cath and Coronary Angiography;  Surgeon: Corky Crafts, MD;  Location: [facility] INVASIVE CV LAB;  Service: Cardiovascular;  Laterality: N/A;   CATARACT EXTRACTION Bilateral    CHOLECYSTECTOMY     COLON SURGERY     COLONOSCOPY N/A 12/27/2013   Procedure: COLONOSCOPY;  Surgeon: Malissa Hippo, MD;  Location: [facility] ENDO SUITE;  Service: Endoscopy;  Laterality: N/A;  200   COLOSTOMY CLOSURE     CYSTOSCOPY/URETEROSCOPY/HOLMIUM LASER/STENT PLACEMENT Right 11/24/2022   Procedure: CYSTOSCOPY RIGHT URETEROSCOPY/HOLMIUM LASER/STENT PLACEMENT;  Surgeon: Crista Elliot, MD;  Location: [facility] ORS;  Service: Urology;  Laterality: Right;  60 MINS FOR CASE   ENDARTERECTOMY Left 03/10/2022   Procedure: LEFT CAROTID ENDARTERECTOMY;  Surgeon: Leonie Douglas, MD;  Location: [facility] OR;  Service: Vascular;  Laterality: Left;   ESOPHAGOGASTRODUODENOSCOPY N/A 05/07/2014   Procedure: ESOPHAGOGASTRODUODENOSCOPY (EGD);  Surgeon: Malissa Hippo, MD;  Location: [facility] SUITE;  Service: Endoscopy;  Laterality: N/A;   EYE SURGERY Bilateral    Cat Sx   fracture left foot     HERNIA REPAIR     NM MYOCAR PERF WALL MOTION  01/28/2009   Normal   OTHER SURGICAL HISTORY     colostomy, colostomy reversal, for diverticulitis surgical hernia repair, arm surgery, neck surgery   PATCH ANGIOPLASTY Left 03/10/2022   Procedure: PATCH ANGIOPLASTY WITH 1X6CM Kathleen Lime;  Surgeon: Leonie Douglas, MD;  Location: [facility] OR;  Service: Vascular;  Laterality: Left;   US ECHOCARDIOGRAPHY  02/11/2010   Mild MR,trace TR & AI   FAMILY HISTORY Family History  Problem Relation Age of Onset   CVA Maternal Grandmother 9       deceased   Heart disease Maternal Grandmother    Stroke Maternal Grandmother    Breast cancer Maternal Grandmother    Other Mother 50       Cause unknown   Cancer Mother        liver   Heart attack Brother 88       deceased   Breast cancer Sister    Leukemia Maternal Aunt    Glaucoma Maternal Uncle    Bone cancer Maternal Uncle    Spina bifida Daughter    SOCIAL HISTORY Social History   Tobacco Use   Smoking status: Former    Types: Cigarettes    Passive exposure: Never   Smokeless tobacco: Never   Tobacco comments:    Smoke 1-1 1/2 packs a day  Vaping Use   Vaping status: Never Used  Substance Use Topics   Alcohol use: No   Drug use: No       OPHTHALMIC EXAM: Base Eye Exam     Visual Acuity (Snellen - Linear)       Right Left   Dist [place] 20/50 20/25 -2   Dist ph  20/40 -2  Tonometry (Tonopen, 7:40 AM)       Right Left   Pressure 15 16         Pupils       Dark Light Shape React APD   Right 2 1 Round Brisk None   Left 2 1 Round Brisk None         Visual Fields (Counting fingers)       Left  Right    Full Full         Extraocular Movement       Right Left    Full, Ortho Full, Ortho         Neuro/Psych     Oriented x3: Yes   Mood/Affect: Normal         Dilation     Both eyes: 1.0% Mydriacyl, 2.5% Phenylephrine @ 7:40 AM           Slit Lamp and Fundus Exam     Slit Lamp Exam       Right Left   Lids/Lashes Dermatochalasis - upper lid, mild Meibomian gland dysfunction Dermatochalasis - upper lid, Telangiectasia, mild Meibomian gland dysfunction   Conjunctiva/Sclera White and quiet, superior bleb White and quiet   Cornea 1+Punctate epethelial erosions, well healed cataract wound 1+ fine Punctate epithelial erosions, arcus, mild EBMD, well healed cataract wound   Anterior Chamber narrow temporal angle, tube at 1200 Deep and quiet, narrow temporal angle   Iris round and mod dilated Round and moderately dilated to 5.17mm   Lens Posterior chamber intraocular lens, open PC Posterior chamber intraocular lens, trace Posterior capsular opacification   Anterior Vitreous Vitreous syneresis, Posterior vitreous detachment Vitreous syneresis         Fundus Exam       Right Left   Disc Sharp rim, +Pallor, +cupping w/ superior and inferior rim thinning, temporal Peripapillary atrophy mild Pallor, Sharp rim, temporal Peripapillary atrophy   C/D Ratio 0.9 0.5   Macula Flat, Blunted foveal reflex, +focal CNV/PED nasal macula with partial pigment ring, trace IRF/ SRF -- stably improved, Drusen, RPE mottling and clumping, no heme Flat, Blunted foveal reflex, fine drusen, mild ERM, Retinal pigment epithelial mottling and clumping, No heme or edema   Vessels attenuated, Tortuous attenuated, Tortuous   Periphery Attached, mild reticular degeneration, No heme, No RT/RD Attached; no heme           IMAGING AND PROCEDURES  Imaging and Procedures for @TODAY @  OCT, Retina - OU - Both Eyes       Right Eye Quality was good. Central Foveal Thickness: 200. Progression has  been stable. Findings include normal foveal contour, retinal drusen , outer retinal tubulation, subretinal hyper-reflective material, intraretinal hyper-reflective material, intraretinal fluid, pigment epithelial detachment, subretinal fluid, outer retinal atrophy (Stable improvement in focal IRF/SRF overlying nasal PED / CNV, partial PVD).   Left Eye Quality was good. Central Foveal Thickness: 204. Progression has been stable. Findings include normal foveal contour, no IRF, no SRF, retinal drusen (Partial PVD, patchy ORA).   Notes *Images captured and stored on drive  Diagnosis / Impression:  OD: exudative ARMD -- Stable improvement in focal IRF/SRF overlying nasal PED / CNV; partial PVD OS: NFP, no IRF/SRF; +drusen -- nonexudative ARMD  Clinical management:  See below  Abbreviations: NFP - Normal foveal profile. CME - cystoid macular edema. PED - pigment epithelial detachment. IRF - intraretinal fluid. SRF - subretinal fluid. EZ - ellipsoid zone. ERM - epiretinal membrane. ORA - outer retinal atrophy. ORT -  outer retinal tubulation. SRHM - subretinal hyper-reflective material      Intravitreal Injection, Pharmacologic Agent - OD - Right Eye       Time Out 06/23/2023. 8:16 AM. Confirmed correct patient, procedure, site, and patient consented.   Anesthesia Topical anesthesia was used. Anesthetic medications included Lidocaine 2%, Proparacaine 0.5%.   Procedure Preparation included 5% betadine to ocular surface, eyelid speculum. A (32g) needle was used.   Injection: 1.25 mg Bevacizumab 1.25mg /0.80ml   Route: Intravitreal, Site: Right Eye   NDC: [id number], Lot: [id number], Expiration date: 09/12/2023   Post-op Post injection exam found visual acuity of at least counting fingers. The patient tolerated the procedure well. There were no complications. The patient received written and verbal post procedure care education. Post injection medications were not given.             ASSESSMENT/PLAN:   ICD-10-CM   1. Exudative age-related macular degeneration of right eye with active choroidal neovascularization (HCC)  H35.3211 OCT, Retina - OU - Both Eyes    Intravitreal Injection, Pharmacologic Agent - OD - Right Eye    Bevacizumab (AVASTIN) SOLN 1.25 mg    2. Intermediate stage nonexudative age-related macular degeneration of left eye  H35.3122     3. Diabetes mellitus type 2 without retinopathy (HCC)  E11.9     4. Long term (current) use of oral hypoglycemic drugs  Z79.84     5. Essential hypertension  I10     6. Hypertensive retinopathy of both eyes  H35.033     7. Pseudophakia of both eyes  Z96.1     8. Primary open angle glaucoma of both eyes, unspecified glaucoma stage  H40.1130      1. 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, OD  - h/o delayed f/u from 8 wks to 12 on 6.20.22 due to lumpectomy -- dx'd w/ DCIS - h/o delayed follow up from 4 weeks to 8 weeks due to passing of husband (12.11.20-02.12.21)  - s/p IVA OD #1 (12.11.20), #2 (02.12.21), #3 (03.12.21), #4 (04.09.21), #5 (05.14.21), #6 (06.17.21), #7 (07.23.21), #8 (10.15.21), #9 (12.7.21), #10 (03.22.22), #11 (06.20.22), #12 (08.24.22), #13 (10.31.22), #14 (01.03.23), #15 (08.15.23), #16 (11.15.23), #17 (02.29.24), #18 (03.29.24), #19 (05.09.24), #20 (06.20.24), #21 (08.15.24), #22 (10.10.24)  - FA 12.11.20 confirms +CNVM  **history of increased fluid at 7+ months noted on 08.15.23** - OCT today shows stable improvement in focal IRF/SRF overlying nasal PED / CNV at 10 weeks  - BCVA OD 20/40 - stable  - recommend IVA OD #23 today, 12.19.24 with follow up ext to 12 weeks  - pt wishes to proceed with injection  - RBA of procedure discussed, questions answered - informed consent obtained and signed - see procedure note  - Avastin informed consent form re-signed and scanned on 08.15.2023 (OD) - Continue to use AT OD  - f/u 12 weeks -- DFE/OCT/possible injection  2. Age related macular  degeneration, non-exudative, OS  - intermediate stage  - BCVA 20/25 - The incidence, anatomy, and pathology of dry AMD, risk of progression, and the AREDS and AREDS 2 study including smoking risks discussed with patient.   - recommend Amsler grid monitoring  3,4. Diabetes mellitus, type 2 without retinopathy - The incidence, risk factors for progression, natural history and treatment options for diabetic retinopathy  were discussed with patient.   - The need for close monitoring of blood glucose, blood pressure, and serum lipids, avoiding cigarette or any type of tobacco, and the need for long term follow  up was also discussed with patient.  - monitor  5,6. Hypertensive retinopathy OU  - discussed importance of tight BP control  - monitor  7. Pseudophakia OU, PCO OD  - s/p CE/IOL OU (Dr. Harlon Flor)  - IOL in good position  - s/p yag cap OD (04.06.22) -- good PC opening  - monitor  8. POAG OU  - formerly managed by Dr. Harlon Flor -- now following at [facility]  - s/p laser w/ Dr. Harlon Flor -- ?SLT  - s/p trab OD w/ Dr. Zetta Bills  - IOP 15,16  - okay to stay off Cosopt for now -- will restart if IOP increases at next visit  - currently on latanoprost  Ophthalmic Meds Ordered this visit:  Meds ordered this encounter  Medications   Bevacizumab (AVASTIN) SOLN 1.25 mg     Return in about 12 weeks (around 09/15/2023) for f/u exu ARMD OD, DFE, OCT.  There are no Patient Instructions on file for this visit.  This document serves as a record of services personally performed by Karie Chimera, MD, PhD. It was created on their behalf by Annalee Genta, COMT. The creation of this record is the provider's dictation and/or activities during the visit.  Electronically signed by: Annalee Genta, COMT 06/24/23 1:14 AM  This document serves as a record of services personally performed by Karie Chimera, MD, PhD. It was created on their behalf by Glee Arvin. Manson Passey, OA an ophthalmic technician. The  creation of this record is the provider's dictation and/or activities during the visit.    Electronically signed by: Glee Arvin. Manson Passey, OA 06/24/23 1:14 AM  Karie Chimera, M.D., Ph.D. Diseases & Surgery of the Retina and Vitreous [facility]  I have reviewed the above documentation for accuracy and completeness, and I agree with the above. Karie Chimera, M.D., Ph.D. 06/24/23 1:17 AM   Abbreviations: M myopia (nearsighted); A astigmatism; H hyperopia (farsighted); P presbyopia; Mrx spectacle prescription;  CTL contact lenses; OD right eye; OS left eye; OU both eyes  XT exotropia; ET esotropia; PEK punctate epithelial keratitis; PEE punctate epithelial erosions; DES dry eye syndrome; MGD meibomian gland dysfunction; ATs artificial tears; PFAT's preservative free artificial tears; NSC nuclear sclerotic cataract; PSC posterior subcapsular cataract; ERM epi-retinal membrane; PVD posterior vitreous detachment; RD retinal detachment; DM diabetes mellitus; DR diabetic retinopathy; NPDR non-proliferative diabetic retinopathy; PDR proliferative diabetic retinopathy; CSME clinically significant macular edema; DME diabetic macular edema; dbh dot blot hemorrhages; CWS cotton wool spot; POAG primary open angle glaucoma; C/D cup-to-disc ratio; HVF humphrey visual field; GVF goldmann visual field; OCT optical coherence tomography; IOP intraocular pressure; BRVO Branch retinal vein occlusion; CRVO central retinal vein occlusion; CRAO central retinal artery occlusion; BRAO branch retinal artery occlusion; RT retinal tear; SB scleral buckle; PPV pars plana vitrectomy; VH Vitreous hemorrhage; PRP panretinal laser photocoagulation; IVK intravitreal kenalog; VMT vitreomacular traction; MH Macular hole;  NVD neovascularization of the disc; NVE neovascularization elsewhere; AREDS age related eye disease study; ARMD age related macular degeneration; POAG primary open angle glaucoma; EBMD  epithelial/anterior basement membrane dystrophy; ACIOL anterior chamber intraocular lens; IOL intraocular lens; PCIOL posterior chamber intraocular lens; Phaco/IOL phacoemulsification with intraocular lens placement; PRK photorefractive keratectomy; LASIK laser assisted in situ keratomileusis; HTN hypertension; DM diabetes mellitus; COPD chronic obstructive pulmonary disease

## 2023-06-22 NOTE — Patient Instructions (Signed)
Continue on same dosage of Warfarin 1 tablet daily   Recheck INR in 5 wk.  Call Coumadin clinic for any questions or changes in medications.

## 2023-06-23 ENCOUNTER — Ambulatory Visit (INDEPENDENT_AMBULATORY_CARE_PROVIDER_SITE_OTHER): Payer: PPO | Admitting: Ophthalmology

## 2023-06-23 ENCOUNTER — Encounter (INDEPENDENT_AMBULATORY_CARE_PROVIDER_SITE_OTHER): Payer: Self-pay | Admitting: Ophthalmology

## 2023-06-23 DIAGNOSIS — Z7984 Long term (current) use of oral hypoglycemic drugs: Secondary | ICD-10-CM | POA: Diagnosis not present

## 2023-06-23 DIAGNOSIS — Z961 Presence of intraocular lens: Secondary | ICD-10-CM

## 2023-06-23 DIAGNOSIS — H353211 Exudative age-related macular degeneration, right eye, with active choroidal neovascularization: Secondary | ICD-10-CM

## 2023-06-23 DIAGNOSIS — H401130 Primary open-angle glaucoma, bilateral, stage unspecified: Secondary | ICD-10-CM | POA: Diagnosis not present

## 2023-06-23 DIAGNOSIS — E119 Type 2 diabetes mellitus without complications: Secondary | ICD-10-CM | POA: Diagnosis not present

## 2023-06-23 DIAGNOSIS — I10 Essential (primary) hypertension: Secondary | ICD-10-CM | POA: Diagnosis not present

## 2023-06-23 DIAGNOSIS — H35033 Hypertensive retinopathy, bilateral: Secondary | ICD-10-CM

## 2023-06-23 DIAGNOSIS — H353122 Nonexudative age-related macular degeneration, left eye, intermediate dry stage: Secondary | ICD-10-CM

## 2023-06-23 MED ORDER — BEVACIZUMAB CHEMO INJECTION 1.25MG/0.05ML SYRINGE FOR KALEIDOSCOPE
1.2500 mg | INTRAVITREAL | Status: AC | PRN
  Administered 2023-06-23: 1.25 mg via INTRAVITREAL

## 2023-07-27 ENCOUNTER — Ambulatory Visit: Payer: PPO | Attending: Cardiology | Admitting: *Deleted

## 2023-07-27 DIAGNOSIS — Z5181 Encounter for therapeutic drug level monitoring: Secondary | ICD-10-CM | POA: Diagnosis not present

## 2023-07-27 DIAGNOSIS — I82A12 Acute embolism and thrombosis of left axillary vein: Secondary | ICD-10-CM | POA: Diagnosis not present

## 2023-07-27 LAB — POCT INR: INR: 2.6 (ref 2.0–3.0)

## 2023-07-27 NOTE — Patient Instructions (Signed)
Continue on same dosage of Warfarin 1 tablet daily   Recheck INR in 6 wk.  Call Coumadin clinic for any questions or changes in medications.

## 2023-09-07 ENCOUNTER — Ambulatory Visit: Payer: PPO | Attending: Cardiology | Admitting: *Deleted

## 2023-09-07 DIAGNOSIS — I82A12 Acute embolism and thrombosis of left axillary vein: Secondary | ICD-10-CM

## 2023-09-07 DIAGNOSIS — Z5181 Encounter for therapeutic drug level monitoring: Secondary | ICD-10-CM | POA: Diagnosis not present

## 2023-09-07 LAB — POCT INR: INR: 3.6 — AB (ref 2.0–3.0)

## 2023-09-07 NOTE — Patient Instructions (Signed)
 Hold warfarin tonight then resume 1 tablet daily   Recheck INR in 3 wk.  Call Coumadin clinic for any questions or changes in medications.

## 2023-09-15 ENCOUNTER — Encounter (INDEPENDENT_AMBULATORY_CARE_PROVIDER_SITE_OTHER): Payer: PPO | Admitting: Ophthalmology

## 2023-09-15 DIAGNOSIS — H353211 Exudative age-related macular degeneration, right eye, with active choroidal neovascularization: Secondary | ICD-10-CM

## 2023-09-15 DIAGNOSIS — H353122 Nonexudative age-related macular degeneration, left eye, intermediate dry stage: Secondary | ICD-10-CM

## 2023-09-15 DIAGNOSIS — Z961 Presence of intraocular lens: Secondary | ICD-10-CM

## 2023-09-15 DIAGNOSIS — I10 Essential (primary) hypertension: Secondary | ICD-10-CM

## 2023-09-15 DIAGNOSIS — Z7984 Long term (current) use of oral hypoglycemic drugs: Secondary | ICD-10-CM

## 2023-09-15 DIAGNOSIS — H35033 Hypertensive retinopathy, bilateral: Secondary | ICD-10-CM

## 2023-09-15 DIAGNOSIS — E119 Type 2 diabetes mellitus without complications: Secondary | ICD-10-CM

## 2023-09-15 DIAGNOSIS — H26491 Other secondary cataract, right eye: Secondary | ICD-10-CM

## 2023-09-15 DIAGNOSIS — H401130 Primary open-angle glaucoma, bilateral, stage unspecified: Secondary | ICD-10-CM

## 2023-09-19 ENCOUNTER — Encounter (INDEPENDENT_AMBULATORY_CARE_PROVIDER_SITE_OTHER): Admitting: Ophthalmology

## 2023-09-19 DIAGNOSIS — Z961 Presence of intraocular lens: Secondary | ICD-10-CM

## 2023-09-19 DIAGNOSIS — H401130 Primary open-angle glaucoma, bilateral, stage unspecified: Secondary | ICD-10-CM

## 2023-09-19 DIAGNOSIS — H353122 Nonexudative age-related macular degeneration, left eye, intermediate dry stage: Secondary | ICD-10-CM

## 2023-09-19 DIAGNOSIS — H35033 Hypertensive retinopathy, bilateral: Secondary | ICD-10-CM

## 2023-09-19 DIAGNOSIS — I10 Essential (primary) hypertension: Secondary | ICD-10-CM

## 2023-09-19 DIAGNOSIS — Z7984 Long term (current) use of oral hypoglycemic drugs: Secondary | ICD-10-CM

## 2023-09-19 DIAGNOSIS — H353211 Exudative age-related macular degeneration, right eye, with active choroidal neovascularization: Secondary | ICD-10-CM

## 2023-09-19 DIAGNOSIS — E119 Type 2 diabetes mellitus without complications: Secondary | ICD-10-CM

## 2023-09-22 ENCOUNTER — Encounter (INDEPENDENT_AMBULATORY_CARE_PROVIDER_SITE_OTHER): Payer: Self-pay | Admitting: Ophthalmology

## 2023-09-22 ENCOUNTER — Ambulatory Visit (INDEPENDENT_AMBULATORY_CARE_PROVIDER_SITE_OTHER): Admitting: Ophthalmology

## 2023-09-22 DIAGNOSIS — H353122 Nonexudative age-related macular degeneration, left eye, intermediate dry stage: Secondary | ICD-10-CM | POA: Diagnosis not present

## 2023-09-22 DIAGNOSIS — H353211 Exudative age-related macular degeneration, right eye, with active choroidal neovascularization: Secondary | ICD-10-CM

## 2023-09-22 DIAGNOSIS — Z7984 Long term (current) use of oral hypoglycemic drugs: Secondary | ICD-10-CM

## 2023-09-22 DIAGNOSIS — E119 Type 2 diabetes mellitus without complications: Secondary | ICD-10-CM | POA: Diagnosis not present

## 2023-09-22 DIAGNOSIS — H401130 Primary open-angle glaucoma, bilateral, stage unspecified: Secondary | ICD-10-CM

## 2023-09-22 DIAGNOSIS — I10 Essential (primary) hypertension: Secondary | ICD-10-CM

## 2023-09-22 DIAGNOSIS — Z961 Presence of intraocular lens: Secondary | ICD-10-CM

## 2023-09-22 DIAGNOSIS — H26491 Other secondary cataract, right eye: Secondary | ICD-10-CM

## 2023-09-22 DIAGNOSIS — H35033 Hypertensive retinopathy, bilateral: Secondary | ICD-10-CM | POA: Diagnosis not present

## 2023-09-22 MED ORDER — BEVACIZUMAB CHEMO INJECTION 1.25MG/0.05ML SYRINGE FOR KALEIDOSCOPE
1.2500 mg | INTRAVITREAL | Status: AC | PRN
  Administered 2023-09-22: 1.25 mg via INTRAVITREAL

## 2023-09-22 NOTE — Progress Notes (Signed)
 [facility] - Clinic Note  09/22/2023     CHIEF COMPLAINT Patient presents for Retina Follow Up  HISTORY OF PRESENT ILLNESS: [name] is a 83 y.o. female who presents to the clinic today for:  HPI     Retina Follow Up   Patient presents with  Wet AMD.  In right eye.  This started 12 weeks ago.  I, the attending physician,  performed the HPI with the patient and updated documentation appropriately.        Comments   Patient here for 12 weeks retina follow up for exu ARMD OD. Patient states vision about the same. Harder to read big print Bible. Saw Dr Dione Booze this past Monday. (3 days ago). Pressures were too high. Put on Latanoprost QID OU and Blue top BID OU.      Last edited by Rennis Chris, MD on 09/22/2023 12:56 PM.    Pt states some days she can see better than others   Referring physician: [name], MD [address] [place],  [place] [address]  HISTORICAL INFORMATION:  Selected notes from the MEDICAL RECORD NUMBER Referred by Dr. Daisy Lazar for concern of SRF OD LEE: 11.27.20 (M. Cotter) [BCVA: OD: 20/80-- OS: 20/60-]  Ocular Hx-glaucoma (latanoprost)  PMH-DM    CURRENT MEDICATIONS: Current Outpatient Medications (Ophthalmic Drugs)  Medication Sig   dorzolamide-timolol (COSOPT) 22.3-6.8 MG/ML ophthalmic solution INSTILL 1 DROP INTO RIGHT EYE TWICE A DAY (Patient taking differently: Place 1 drop into the right eye 2 (two) times daily.)   latanoprost (XALATAN) 0.005 % ophthalmic solution Place 1 drop into the left eye at bedtime.   Polyethyl Glycol-Propyl Glycol (SYSTANE OP) Place 1 drop into the left eye daily as needed (dry eye).   No current facility-administered medications for this visit. (Ophthalmic Drugs)   Current Outpatient Medications (Other)  Medication Sig   albuterol (PROVENTIL HFA;VENTOLIN HFA) 108 (90 BASE) MCG/ACT inhaler Inhale 2 puffs into the lungs every 4 (four) hours as needed for shortness of breath.    albuterol (PROVENTIL) (2.5 MG/3ML) 0.083% nebulizer solution Take 2.5 mg by nebulization every 6 (six) hours as needed for wheezing or shortness of breath.   Alirocumab (PRALUENT) 75 MG/ML SOAJ Inject 1 mL (75 mg total) into the skin every 14 (fourteen) days.   amitriptyline (ELAVIL) 25 MG tablet Take 25 mg by mouth at bedtime.   anastrozole (ARIMIDEX) 1 MG tablet Take 1 tablet (1 mg total) by mouth at bedtime.   carvedilol (COREG) 6.25 MG tablet TAKE ONE TABLET BY MOUTH TWICE DAILY   cetirizine (ZYRTEC) 10 MG tablet Take 10 mg by mouth daily.   Cholecalciferol (VITAMIN D-3) 1000 units CAPS Take 1,000 Units by mouth daily.   diazepam (VALIUM) 2 MG tablet Take 2 mg by mouth 2 (two) times daily as needed for anxiety (prior to eye injections).   esomeprazole (NEXIUM) 20 MG capsule Take 20 mg by mouth daily at 12 noon.   FARXIGA 5 MG TABS tablet Take 5 mg by mouth every morning.   fluticasone (FLOVENT HFA) 110 MCG/ACT inhaler Inhale 1 puff into the lungs 2 (two) times daily. Rinse mouth with water after each use   furosemide (LASIX) 40 MG tablet TAKE 1 TABLET (40 MG TOTAL) BY MOUTH DAILY. MAY TAKE EXTRA DAILY AS NEEDED FOR SWELLING (Patient taking differently: Take 40 mg by mouth daily.)   gabapentin (NEURONTIN) 300 MG capsule Take 300 mg by mouth 3 (three) times daily as needed (pain).  glimepiride (AMARYL) 2 MG tablet Take 2 mg by mouth daily.   hydrALAZINE (APRESOLINE) 25 MG tablet TAKE 1 TABLET BY MOUTH TWO TIMES DAILY AT 8 AM AND 10 PM.   isosorbide mononitrate (IMDUR) 30 MG 24 hr tablet TAKE ONE TABLET BY MOUTH ONCE DAILY   meclizine (ANTIVERT) 25 MG tablet Take 25 mg by mouth 2 (two) times daily as needed for dizziness.   Multiple Vitamins-Minerals (PRESERVISION AREDS 2 PO) Take 1 capsule by mouth in the morning and at bedtime.   NON FORMULARY CBD Gummy   Omega-3 Fatty Acids (FISH OIL) 1200 MG CAPS Take 1,200 mg by mouth 2 (two) times daily.   ondansetron (ZOFRAN-ODT) 4 MG disintegrating  tablet Take 1 tablet (4 mg total) by mouth every 8 (eight) hours as needed.   polyethylene glycol (MIRALAX / GLYCOLAX) packet Take 17 g by mouth daily as needed for moderate constipation.   potassium chloride SA (KLOR-CON M) 20 MEQ tablet TAKE ONE-HALF TABLET BY  MOUTH DAILY (Patient taking differently: Take 10 mEq by mouth See admin instructions. Only takes when taking)   promethazine-dextromethorphan (PROMETHAZINE-DM) 6.25-15 MG/5ML syrup Take 5 mLs by mouth 4 (four) times daily as needed.   tamsulosin (FLOMAX) 0.4 MG CAPS capsule Take 1 capsule (0.4 mg total) by mouth daily.   vitamin B-12 (CYANOCOBALAMIN) 100 MCG tablet Take 100 mcg by mouth daily.   warfarin (COUMADIN) 2 MG tablet TAKE 1 TABLET BY MOUTH EVERY DAY AT BEDTIME OR AS DIRECTED BY THE COUMADIN CLINIC   No current facility-administered medications for this visit. (Other)   REVIEW OF SYSTEMS: ROS   Positive for: Genitourinary, Endocrine, Cardiovascular, Eyes Negative for: Constitutional, Gastrointestinal, Neurological, Skin, Musculoskeletal, HENT, Respiratory, Psychiatric, Allergic/Imm, Heme/Lymph Last edited by Laddie Aquas, COA on 09/22/2023  7:56 AM.     ALLERGIES Allergies  Allergen Reactions   Alphagan [Brimonidine] Itching   Diflunisal Swelling    Other reaction(s): ENTIRE BODY SWELLING   Vioxx [Rofecoxib] Shortness Of Breath   Metformin And Related     Kidney failure   Nexlizet [Bempedoic Acid-Ezetimibe]     Causes elevated Liver and Kidney function   Repatha [Evolocumab]     MYALGIAS   Codeine Rash   Elemental Sulfur Rash   Motrin [Ibuprofen] Rash   Penicillins Rash   Pravastatin Rash   PAST MEDICAL HISTORY Past Medical History:  Diagnosis Date   Antral gastritis    EGD 11/15   Arthritis    Asthmatic bronchitis    Back pain    Breast cancer (HCC)    right breast   CAD in native artery 03/10/2021   Chronic diastolic heart failure (HCC) 05/20/2015   Grade 2 diastolic dysfunction.  04/2015.    Chronic kidney disease    kidney function low   COPD (chronic obstructive pulmonary disease) (HCC)    Diabetes mellitus    x 5 yrs   DVT of axillary vein, acute left (HCC) 07/24/2012   GERD (gastroesophageal reflux disease)    Glaucoma    POAG OU   Heart murmur    rheum fever at age 50   History of hiatal hernia    History of kidney stones    Hyperlipidemia 05/20/2015   Hypertension    Hypertensive retinopathy    OU   Hypothyroidism    Kidney stones    Macular degeneration    Wet OD, Dry OS   Mixed hyperlipidemia    OSA (obstructive sleep apnea) 12/17/2021   does not use cpap  on regular basis   Peripheral venous insufficiency    Pinched nerve    right elbow   Pneumonia    PONV (postoperative nausea and vomiting)    Sigmoid diverticulitis    Snoring 03/10/2021   Vertigo    chonic   Past Surgical History:  Procedure Laterality Date   ABDOMINAL HYSTERECTOMY     BACK SURGERY     spinal    BREAST LUMPECTOMY WITH RADIOACTIVE SEED LOCALIZATION Right 12/03/2020   Procedure: RIGHT BREAST LUMPECTOMY WITH RADIOACTIVE SEED LOCALIZATION;  Surgeon: Manus Rudd, MD;  Location: [facility] OR;  Service: General;  Laterality: Right;   CARDIAC CATHETERIZATION N/A 05/26/2015   Procedure: Left Heart Cath and Coronary Angiography;  Surgeon: Corky Crafts, MD;  Location: [facility] INVASIVE CV LAB;  Service: Cardiovascular;  Laterality: N/A;   CATARACT EXTRACTION Bilateral    CHOLECYSTECTOMY     COLON SURGERY     COLONOSCOPY N/A 12/27/2013   Procedure: COLONOSCOPY;  Surgeon: Malissa Hippo, MD;  Location: [facility] ENDO SUITE;  Service: Endoscopy;  Laterality: N/A;  200   COLOSTOMY CLOSURE     CYSTOSCOPY/URETEROSCOPY/HOLMIUM LASER/STENT PLACEMENT Right 11/24/2022   Procedure: CYSTOSCOPY RIGHT URETEROSCOPY/HOLMIUM LASER/STENT PLACEMENT;  Surgeon: Crista Elliot, MD;  Location: [facility] ORS;  Service: Urology;  Laterality: Right;  60 MINS FOR CASE   ENDARTERECTOMY Left 03/10/2022   Procedure: LEFT CAROTID  ENDARTERECTOMY;  Surgeon: Leonie Douglas, MD;  Location: [facility] OR;  Service: Vascular;  Laterality: Left;   ESOPHAGOGASTRODUODENOSCOPY N/A 05/07/2014   Procedure: ESOPHAGOGASTRODUODENOSCOPY (EGD);  Surgeon: Malissa Hippo, MD;  Location: [facility] ENDO SUITE;  Service: Endoscopy;  Laterality: N/A;   EYE SURGERY Bilateral    Cat Sx   fracture left foot     HERNIA REPAIR     NM MYOCAR PERF WALL MOTION  01/28/2009   Normal   OTHER SURGICAL HISTORY     colostomy, colostomy reversal, for diverticulitis surgical hernia repair, arm surgery, neck surgery   PATCH ANGIOPLASTY Left 03/10/2022   Procedure: PATCH ANGIOPLASTY WITH 1X6CM Kathleen Lime;  Surgeon: Leonie Douglas, MD;  Location: [facility] OR;  Service: Vascular;  Laterality: Left;   US ECHOCARDIOGRAPHY  02/11/2010   Mild MR,trace TR & AI   FAMILY HISTORY Family History  Problem Relation Age of Onset   CVA Maternal Grandmother 11       deceased   Heart disease Maternal Grandmother    Stroke Maternal Grandmother    Breast cancer Maternal Grandmother    Other Mother 70       Cause unknown   Cancer Mother        liver   Heart attack Brother 25       deceased   Breast cancer Sister    Leukemia Maternal Aunt    Glaucoma Maternal Uncle    Bone cancer Maternal Uncle    Spina bifida Daughter    SOCIAL HISTORY Social History   Tobacco Use   Smoking status: Former    Types: Cigarettes    Passive exposure: Never   Smokeless tobacco: Never   Tobacco comments:    Smoke 1-1 1/2 packs a day  Vaping Use   Vaping status: Never Used  Substance Use Topics   Alcohol use: No   Drug use: No       OPHTHALMIC EXAM: Base Eye Exam     Visual Acuity (Snellen - Linear)       Right Left   Dist [place] 20/50 20/30 -1  Dist ph [place] 20/40 20/30 +1         Tonometry (Tonopen, 7:52 AM)       Right Left   Pressure 11 13         Pupils       Dark Light Shape React APD   Right 2 1 Round Brisk None   Left 2 1 Round Brisk None         Visual  Fields (Counting fingers)       Left Right    Full Full         Extraocular Movement       Right Left    Full, Ortho Full, Ortho         Neuro/Psych     Oriented x3: Yes   Mood/Affect: Normal         Dilation     Both eyes: 1.0% Mydriacyl, 2.5% Phenylephrine @ 7:52 AM           Slit Lamp and Fundus Exam     Slit Lamp Exam       Right Left   Lids/Lashes Dermatochalasis - upper lid, mild Meibomian gland dysfunction Dermatochalasis - upper lid, Telangiectasia, mild Meibomian gland dysfunction   Conjunctiva/Sclera White and quiet, superior bleb White and quiet   Cornea 1+Punctate epethelial erosions, well healed cataract wound 1+ fine Punctate epithelial erosions, arcus, mild EBMD, well healed cataract wound   Anterior Chamber narrow temporal angle, tube at 1200 Deep and quiet, narrow temporal angle   Iris round and mod dilated Round and moderately dilated to 5.6mm   Lens Posterior chamber intraocular lens, open PC Posterior chamber intraocular lens, trace Posterior capsular opacification   Anterior Vitreous Vitreous syneresis, Posterior vitreous detachment Vitreous syneresis         Fundus Exam       Right Left   Disc Sharp rim, +Pallor, +cupping w/ superior and inferior rim thinning, temporal Peripapillary atrophy mild Pallor, Sharp rim, temporal Peripapillary atrophy   C/D Ratio 0.9 0.5   Macula Flat, Blunted foveal reflex, +focal CNV/PED nasal macula with partial pigment ring, trace IRF/ SRF -- stably improved, Drusen, RPE mottling and clumping, no heme Flat, Blunted foveal reflex, fine drusen, mild ERM, Retinal pigment epithelial mottling and clumping, No heme or edema   Vessels attenuated, Tortuous attenuated, Tortuous   Periphery Attached, mild reticular degeneration, No heme, No RT/RD Attached; no heme           IMAGING AND PROCEDURES  Imaging and Procedures for @TODAY @  OCT, Retina - OU - Both Eyes       Right Eye Quality was good. Central  Foveal Thickness: 193. Progression has been stable. Findings include normal foveal contour, retinal drusen , outer retinal tubulation, subretinal hyper-reflective material, intraretinal hyper-reflective material, intraretinal fluid, pigment epithelial detachment, subretinal fluid, outer retinal atrophy (Stable improvement in focal IRF / SRF overlying nasal PED / CNV, partial PVD).   Left Eye Quality was good. Central Foveal Thickness: 205. Progression has been stable. Findings include normal foveal contour, no IRF, no SRF, retinal drusen (Partial PVD, patchy ORA).   Notes *Images captured and stored on drive  Diagnosis / Impression:  OD: exudative ARMD -- Stable improvement in focal IRF / SRF overlying nasal PED / CNV, partial PVD OS: NFP, no IRF/SRF; +drusen -- nonexudative ARMD  Clinical management:  See below  Abbreviations: NFP - Normal foveal profile. CME - cystoid macular edema. PED - pigment epithelial detachment. IRF - intraretinal fluid. SRF -  subretinal fluid. EZ - ellipsoid zone. ERM - epiretinal membrane. ORA - outer retinal atrophy. ORT - outer retinal tubulation. SRHM - subretinal hyper-reflective material      Intravitreal Injection, Pharmacologic Agent - OD - Right Eye       Time Out 09/22/2023. 8:21 AM. Confirmed correct patient, procedure, site, and patient consented.   Anesthesia Topical anesthesia was used. Anesthetic medications included Lidocaine 2%, Proparacaine 0.5%.   Procedure Preparation included 5% betadine to ocular surface, eyelid speculum. A (32g) needle was used.   Injection: 1.25 mg Bevacizumab 1.25mg /0.63ml   Route: Intravitreal, Site: Right Eye   NDC: [id number], Lot: [id number], Expiration date: 11/05/2023   Post-op Post injection exam found visual acuity of at least counting fingers. The patient tolerated the procedure well. There were no complications. The patient received written and verbal post procedure care education. Post injection  medications were not given.            ASSESSMENT/PLAN:   ICD-10-CM   1. Exudative age-related macular degeneration of right eye with active choroidal neovascularization (HCC)  H35.3211 OCT, Retina - OU - Both Eyes    Intravitreal Injection, Pharmacologic Agent - OD - Right Eye    Bevacizumab (AVASTIN) SOLN 1.25 mg    2. Intermediate stage nonexudative age-related macular degeneration of left eye  H35.3122     3. Diabetes mellitus type 2 without retinopathy (HCC)  E11.9     4. Long term (current) use of oral hypoglycemic drugs  Z79.84     5. Essential hypertension  I10     6. Hypertensive retinopathy of both eyes  H35.033     7. Pseudophakia of both eyes  Z96.1     8. Primary open angle glaucoma of both eyes, unspecified glaucoma stage  H40.1130     9. PCO (posterior capsular opacification), right  H26.491      1. 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, OD  - delayed f/u from 12 wks to 69 on 03.20.24  - h/o delayed f/u from 8 wks to 12 on 6.20.22 due to lumpectomy -- dx'd w/ DCIS - h/o delayed follow up from 4 weeks to 8 weeks due to passing of husband (12.11.20-02.12.21)  - s/p IVA OD #1 (12.11.20), #2 (02.12.21), #3 (03.12.21), #4 (04.09.21), #5 (05.14.21), #6 (06.17.21), #7 (07.23.21), #8 (10.15.21), #9 (12.7.21), #10 (03.22.22), #11 (06.20.22), #12 (08.24.22), #13 (10.31.22), #14 (01.03.23), #15 (08.15.23), #16 (11.15.23), #17 (02.29.24), #18 (03.29.24), #19 (05.09.24), #20 (06.20.24), #21 (08.15.24), #22 (10.10.24), #23 (12.19.24)  - FA 12.11.20 confirms +CNVM  **history of increased fluid at 7+ months noted on 08.15.23** - OCT OD shows stable improvement in focal IRF/SRF overlying nasal PED / CNV at 17 weeks  - BCVA OD 20/40 - stable  - recommend IVA OD #24 today, 03.20.25 with follow up in 16 weeks  - pt wishes to proceed with injection  - RBA of procedure discussed, questions answered - informed consent obtained and signed - see procedure note  - Avastin  informed consent form re-signed and scanned on 08.15.2023 (OD) - Continue to use AT OD  - f/u 16 weeks -- DFE/OCT/possible injection  2. Age related macular degeneration, non-exudative, OS  - intermediate stage  - BCVA 20/25 - The incidence, anatomy, and pathology of dry AMD, risk of progression, and the AREDS and AREDS 2 study including smoking risks discussed with patient.   - recommend Amsler grid monitoring  3,4. Diabetes mellitus, type 2 without retinopathy - The incidence, risk factors for progression, natural history  and treatment options for diabetic retinopathy  were discussed with patient.   - The need for close monitoring of blood glucose, blood pressure, and serum lipids, avoiding cigarette or any type of tobacco, and the need for long term follow up was also discussed with patient.  - monitor  5,6. Hypertensive retinopathy OU  - discussed importance of tight BP control  - monitor  7. Pseudophakia OU, PCO OD  - s/p CE/IOL OU (Dr. Harlon Flor)  - IOL in good position  - s/p yag cap OD (04.06.22) -- good PC opening  - monitor  8. POAG OU  - formerly managed by Dr. Harlon Flor -- now following at [facility]  - s/p laser w/ Dr. Harlon Flor -- ?SLT  - s/p trab OD w/ Dr. Zetta Bills  - IOP 11,13  - re-started Cosopt per Dr. Dione Booze on 03.17.25  - currently on latanoprost  Ophthalmic Meds Ordered this visit:  Meds ordered this encounter  Medications   Bevacizumab (AVASTIN) SOLN 1.25 mg     Return in about 16 weeks (around 01/12/2024) for f/u exu ARMD OD, DFE, OCT.  There are no Patient Instructions on file for this visit.  This document serves as a record of services personally performed by Karie Chimera, MD, PhD. It was created on their behalf by Glee Arvin. Manson Passey, OA an ophthalmic technician. The creation of this record is the provider's dictation and/or activities during the visit.    Electronically signed by: Glee Arvin. Manson Passey, OA 09/22/23 12:57 PM  Karie Chimera, M.D.,  Ph.D. Diseases & Surgery of the Retina and Vitreous [facility]  I have reviewed the above documentation for accuracy and completeness, and I agree with the above. Karie Chimera, M.D., Ph.D. 09/22/23 12:58 PM   Abbreviations: M myopia (nearsighted); A astigmatism; H hyperopia (farsighted); P presbyopia; Mrx spectacle prescription;  CTL contact lenses; OD right eye; OS left eye; OU both eyes  XT exotropia; ET esotropia; PEK punctate epithelial keratitis; PEE punctate epithelial erosions; DES dry eye syndrome; MGD meibomian gland dysfunction; ATs artificial tears; PFAT's preservative free artificial tears; NSC nuclear sclerotic cataract; PSC posterior subcapsular cataract; ERM epi-retinal membrane; PVD posterior vitreous detachment; RD retinal detachment; DM diabetes mellitus; DR diabetic retinopathy; NPDR non-proliferative diabetic retinopathy; PDR proliferative diabetic retinopathy; CSME clinically significant macular edema; DME diabetic macular edema; dbh dot blot hemorrhages; CWS cotton wool spot; POAG primary open angle glaucoma; C/D cup-to-disc ratio; HVF humphrey visual field; GVF goldmann visual field; OCT optical coherence tomography; IOP intraocular pressure; BRVO Branch retinal vein occlusion; CRVO central retinal vein occlusion; CRAO central retinal artery occlusion; BRAO branch retinal artery occlusion; RT retinal tear; SB scleral buckle; PPV pars plana vitrectomy; VH Vitreous hemorrhage; PRP panretinal laser photocoagulation; IVK intravitreal kenalog; VMT vitreomacular traction; MH Macular hole;  NVD neovascularization of the disc; NVE neovascularization elsewhere; AREDS age related eye disease study; ARMD age related macular degeneration; POAG primary open angle glaucoma; EBMD epithelial/anterior basement membrane dystrophy; ACIOL anterior chamber intraocular lens; IOL intraocular lens; PCIOL posterior chamber intraocular lens; Phaco/IOL phacoemulsification with  intraocular lens placement; PRK photorefractive keratectomy; LASIK laser assisted in situ keratomileusis; HTN hypertension; DM diabetes mellitus; COPD chronic obstructive pulmonary disease

## 2023-09-28 ENCOUNTER — Ambulatory Visit: Attending: Cardiology | Admitting: *Deleted

## 2023-09-28 DIAGNOSIS — I82A12 Acute embolism and thrombosis of left axillary vein: Secondary | ICD-10-CM

## 2023-09-28 DIAGNOSIS — Z5181 Encounter for therapeutic drug level monitoring: Secondary | ICD-10-CM

## 2023-09-28 LAB — POCT INR: INR: 1.1 — AB (ref 2.0–3.0)

## 2023-09-28 NOTE — Patient Instructions (Signed)
 Take warfarin 1 1/2 tablets tonight and tomorrow night then resume 1 tablet daily   Recheck INR in 2 wks Pt states medications have been wrong since [facility] change to Exact care. Told pt to start putting warfarin in a pill box by its self and she states she will. Call Coumadin clinic for any questions or changes in medications.

## 2023-10-03 ENCOUNTER — Telehealth: Payer: Self-pay | Admitting: Cardiovascular Disease

## 2023-10-03 NOTE — Telephone Encounter (Signed)
 Left message for patient to call back

## 2023-10-03 NOTE — Telephone Encounter (Signed)
 Pt c/o of Chest Pain: STAT if active (IN THIS MOMENT) CP, including tightness, pressure, jaw pain, shoulder/upper arm/back pain, SOB, nausea, and vomiting.  1. Are you having CP right now (tightness, pressure, or discomfort)?  No   2. Are you experiencing any other symptoms (ex. SOB, nausea, vomiting, sweating)?  Patient says she has a little gas   3. How long have you been experiencing CP?  Past few nights   4. Is your CP continuous or coming and going?  Coming and going   5. Have you taken Nitroglycerin?  No, doesn't have any

## 2023-10-03 NOTE — Telephone Encounter (Signed)
 Chest pain with mixed typical and atypical features. Best addressed by office visit. If she has recurrent chest pain prior to office visit, would recommend ED evaluation.   Available appointments (would recheck when calling her in case they have been used):  10/04/23 at 2:45 with Reather Littler, NP at [facility] 10/05/23 at 8:50 AM with Azalee Course, PA at [facility] 10/06/23 at 8:25 AM with Azalee Course, PA at [facility] 10/07/23 at 2:45 PM with Alver Sorrow, NP at [facility]  Alver Sorrow, NP

## 2023-10-03 NOTE — Telephone Encounter (Signed)
 Call from pt transferred from call center. DOB and last name verified.   Patient's concerns: Patient has concerns of having severe chest pains and being unable to sleep for the past two nights. She has had some belching included with CP. No flatulence. States the pain is between her breasts. She feels fine today. Denies SOB and confirms that she drinks caffeine free mountain dew and she has drank this for years along with water. Pt is concerned because a close friend passed away a few days after having chest pain and some gas.  Unable to give BP readings; misplaced her BP cuff. Confirms taking her medications but is upset with how her meds come from the mailing pharmacy; receives broken pills.  Nurse's Recommendations: Will f/u with APP and call pt back with recommendations. Advised pt to go to ED if CP returns and becomes worse.  Patient verbalized understanding.

## 2023-10-03 NOTE — Telephone Encounter (Signed)
 Pt scheduled for 4/1 @ 2:45 p with Reather Littler, NP. Pt verbalized understanding and advised to go to ED if CP come back and persists per CP protocol.

## 2023-10-03 NOTE — Progress Notes (Unsigned)
 Cardiology Office Note    Date:  10/05/2023  ID:  [name], DOB 14-Oct-1940, MRN [id number] PCP:  [name], MD  Cardiologist:  Chilton Si, MD  Electrophysiologist:  None   Chief Complaint: Chest pain   History of Present Illness: .    [name] is a 83 y.o. female with visit-pertinent history of CAD, (50% LAD stenosis), chronic diastolic heart failure (grade 2), hypertension, hyperlipidemia, prior DVT on warfarin, breast cancer s/p lumpectomy and XRT, DM 2, carotid stenosis s/p left carotid endarterectomy in 03/2022, right bundle branch block.  Myoview in 04/2015 for chest pain indicated LVEF 70% with small defect of moderate severity in the mid anterior and apical anterior region.  Felt to be due to breast attenuation artifact.  Underwent LHC in 05/2015 with 50% LAD lesion.  There was concern for component of vasospasm and long-acting nitrates were added.  BP and lipid agents have been titrated due to poor control.  She is on Lasix due to lower extremity edema.  MPI in 10/2019 indicated nuclear stress EF of 74%, no ST segment deviation noted during stress, was low risk and normal.  Sleep study in 03/2021 with mild sleep apnea.  She recommended for oral airway device or CPAP.  Seen 02/2022 and CTA confirmed severe stenosis of left common carotid artery and ICA, she underwent carotid endarterectomy in 03/2022.  Patient was last seen in clinic on 04/26/2023.  She reported that since prior stent in her eye, she had some balance issues.  Patient's blood pressure is well-controlled.  She remained stable from a cardiac standpoint, is recommended that she follow-up in 6 months.  Today patient presents reporting episodes of chest pain.  She reports that last Saturday and Sunday night she started having middle of the chest pain with some associated belching. Reports she did take tylenol and tums with no true improvement in symptoms.  She denies any associated shortness of breath, lower  extremity edema, orthopnea or PND.  Patient denies any recent palpitations, presyncope or syncope.  Patient denies any recurrence of chest discomfort after Sunday night.  She notes she is unsure if this is related to GERD or her heart, notes this was sudden onset without any exertion and relieved after a few hours, she did not take any sublingual nitroglycerin.  Patient denies any recent episodes of chest discomfort with exertion. ROS: .   Today she denies shortness of breath, lower extremity edema, fatigue, palpitations, melena, hematuria, hemoptysis, diaphoresis, weakness, presyncope, syncope, orthopnea, and PND.  All other systems are reviewed and otherwise negative. Studies Reviewed: [name]   EKG:  EKG is ordered today, personally reviewed, demonstrating  EKG Interpretation Date/Time:  Tuesday October 04 2023 13:18:40 EDT Ventricular Rate:  84 PR Interval:  180 QRS Duration:  128 QT Interval:  404 QTC Calculation: 477 R Axis:   112  Text Interpretation: Normal sinus rhythm Right bundle branch block Left posterior fascicular block Confirmed by Reather Littler [phone]) on 10/04/2023 1:21:54 PM   CV Studies: Cardiac studies reviewed are outlined and summarized above. Otherwise please see EMR for full report. Cardiac Studies & Procedures   ______________________________________________________________________________________________ CARDIAC CATHETERIZATION  CARDIAC CATHETERIZATION 05/26/2015  Narrative  Mid LAD lesion, 50% stenosed. FFR of this lesion showed value of 0.85. This is felt to be nonsignificant.  Normal LVEDP.  Continue aggressive medical therapy. Would consider adding long-acting nitrate as the patient may have some component of vasospasm. Of note, in the future , if repeat catheterization was  needed , would consider using a 5 [place] guide catheter as her left main is very short and the catheter tends to deep seat  Into either the LAD or the circumflex.  She can resume Coumadin  With  follow-up appointment later this week.  Findings Coronary Findings Diagnostic  Dominance: Right  Left Anterior Descending  Left Circumflex  Right Coronary Artery  Intervention  No interventions have been documented.   STRESS TESTS  MYOCARDIAL PERFUSION IMAGING 10/09/2019  Narrative  Nuclear stress EF: 74%. The left ventricular ejection fraction is hyperdynamic (>65%).  There was no ST segment deviation noted during stress.  This is a low risk study. There is no evidence of ischemia or previous infarction.  The study is normal.   ECHOCARDIOGRAM  ECHOCARDIOGRAM COMPLETE 04/25/2015  Narrative *[facility]* [address] [place], [place] [address] [phone]  ------------------------------------------------------------------- Transthoracic Echocardiography  Patient:    Darlene, [name] MR #:       [id number] Study Date: 04/25/2015 Gender:     F Age:        56 Height:     157.5 cm Weight:     78 kg BSA:        1.88 m^2 Pt. Status: Room:  SONOGRAPHER  [name], RDCS PERFORMING   [facility], Outpatient ATTENDING    Chilton Si, MD ORDERING     Chilton Si, MD REFERRING    Chilton Si, MD  cc:  ------------------------------------------------------------------- LV EF: 55% -   60%  ------------------------------------------------------------------- Indications:      Chest pain (R07.2).  ------------------------------------------------------------------- History:   Risk factors:  DVT. Epistaxis. Gastritis. Acute renal failure. Anemia. Former tobacco use. Hypertension. Diabetes mellitus.  ------------------------------------------------------------------- Study Conclusions  - Left ventricle: The cavity size was normal. Systolic function was normal. The estimated ejection fraction was in the range of 55% to 60%. Wall motion was normal; there were no regional wall motion abnormalities. Features are consistent with a  pseudonormal left ventricular filling pattern, with concomitant abnormal relaxation and increased filling pressure (grade 2 diastolic dysfunction). - Mitral valve: Calcified annulus. There was mild regurgitation directed posteriorly.  Transthoracic echocardiography.  M-mode, complete 2D, spectral Doppler, and color Doppler.  Birthdate:  Patient birthdate: 11/23/1940.  Age:  Patient is 83 yr old.  Sex:  Gender: female. BMI: 31.5 kg/m^2.  Blood pressure:     152/94  Patient status: Outpatient.  Study date:  Study date: 04/25/2015. Study time: 10:12 AM.  Location:  [facility]  -------------------------------------------------------------------  ------------------------------------------------------------------- Left ventricle:  The cavity size was normal. Systolic function was normal. The estimated ejection fraction was in the range of 55% to 60%. Wall motion was normal; there were no regional wall motion abnormalities. Features are consistent with a pseudonormal left ventricular filling pattern, with concomitant abnormal relaxation and increased filling pressure (grade 2 diastolic dysfunction).  ------------------------------------------------------------------- Aortic valve:   Trileaflet; normal thickness leaflets. Mobility was not restricted.  Doppler:  Transvalvular velocity was within the normal range. There was no stenosis. There was no regurgitation.  ------------------------------------------------------------------- Aorta:  Aortic root: The aortic root was normal in size.  ------------------------------------------------------------------- Mitral valve:   Calcified annulus. Mobility was not restricted. Doppler:  Transvalvular velocity was within the normal range. There was no evidence for stenosis. There was mild regurgitation directed posteriorly.    Peak gradient (D): 6 mm Hg.  ------------------------------------------------------------------- Left atrium:  The  atrium was normal in size.  ------------------------------------------------------------------- Right ventricle:  The cavity size was normal. Wall thickness  was normal. Systolic function was normal.  ------------------------------------------------------------------- Pulmonic valve:   Poorly visualized.  Doppler:  Transvalvular velocity was within the normal range. There was no evidence for stenosis.  ------------------------------------------------------------------- Tricuspid valve:   Structurally normal valve.    Doppler: Transvalvular velocity was within the normal range. There was no regurgitation.  ------------------------------------------------------------------- Pulmonary artery:   The main pulmonary artery was normal-sized. Systolic pressure was within the normal range.  ------------------------------------------------------------------- Right atrium:  The atrium was normal in size.  ------------------------------------------------------------------- Pericardium:  There was no pericardial effusion.  ------------------------------------------------------------------- Systemic veins: Inferior vena cava: The vessel was normal in size. The respirophasic diameter changes were in the normal range (>= 50%), consistent with normal central venous pressure.  ------------------------------------------------------------------- Measurements  Left ventricle                           Value        Reference LV ID, ED, PLAX chordal                  43    mm     43 - 52 LV ID, ES, PLAX chordal                  27    mm     23 - 38 LV fx shortening, PLAX chordal           37    %      >=29 LV PW thickness, ED                      9     mm     --------- IVS/LV PW ratio, ED                      1.11         <=1.3 Stroke volume, 2D                        75    ml     --------- Stroke volume/bsa, 2D                    40    ml/m^2 --------- LV ejection fraction, 1-p A4C            64     %      --------- LV end-diastolic volume, 2-p             57    ml     --------- LV end-systolic volume, 2-p              22    ml     --------- LV ejection fraction, 2-p                62    %      --------- Stroke volume, 2-p                       35    ml     --------- LV end-diastolic volume/bsa, 2-p         30    ml/m^2 --------- LV end-systolic volume/bsa, 2-p          12    ml/m^2 --------- Stroke volume/bsa, 2-p                   18.7  ml/m^2 ---------  LV e&', lateral                           7.94  cm/s   --------- LV E/e&', lateral                         15.62        --------- LV e&', medial                            9.79  cm/s   --------- LV E/e&', medial                          12.67        --------- LV e&', average                           8.87  cm/s   --------- LV E/e&', average                         13.99        ---------  Ventricular septum                       Value        Reference IVS thickness, ED                        10    mm     ---------  LVOT                                     Value        Reference LVOT ID, S                               20    mm     --------- LVOT area                                3.14  cm^2   --------- LVOT mean velocity, S                    66    cm/s   --------- LVOT VTI, S                              23.9  cm     ---------  Aorta                                    Value        Reference Aortic root ID, ED                       32    mm     ---------  Left atrium                              Value  Reference LA ID, A-P, ES                           32    mm     --------- LA ID/bsa, A-P                           1.7   cm/m^2 <=2.2 LA volume, S                             43.3  ml     --------- LA volume/bsa, S                         23.1  ml/m^2 --------- LA volume, ES, 1-p A4C                   35.3  ml     --------- LA volume/bsa, ES, 1-p A4C               18.8  ml/m^2 --------- LA volume, ES, 1-p A2C                    48.6  ml     --------- LA volume/bsa, ES, 1-p A2C               25.9  ml/m^2 ---------  Mitral valve                             Value        Reference Mitral E-wave peak velocity              124   cm/s   --------- Mitral deceleration time                 225   ms     150 - 230 Mitral peak gradient, D                  6     mm Hg  --------- Mitral E/A ratio, peak                   1            ---------  Systemic veins                           Value        Reference Estimated CVP                            3     mm Hg  ---------  Right ventricle                          Value        Reference RV s&', lateral, S                        9.68  cm/s   ---------  Legend: (L)  and  (H)  mark values outside specified reference range.  ------------------------------------------------------------------- Prepared and Electronically Authenticated by  Thurmon Fair, MD 2016-10-21T14:45:10          ______________________________________________________________________________________________  Current Reported Medications:.    Current Meds  Medication Sig   albuterol (PROVENTIL HFA;VENTOLIN HFA) 108 (90 BASE) MCG/ACT inhaler Inhale 2 puffs into the lungs every 4 (four) hours as needed for shortness of breath.   albuterol (PROVENTIL) (2.5 MG/3ML) 0.083% nebulizer solution Take 2.5 mg by nebulization every 6 (six) hours as needed for wheezing or shortness of breath.   amitriptyline (ELAVIL) 25 MG tablet Take 25 mg by mouth at bedtime.   anastrozole (ARIMIDEX) 1 MG tablet Take 1 tablet (1 mg total) by mouth at bedtime.   carvedilol (COREG) 6.25 MG tablet TAKE ONE TABLET BY MOUTH TWICE DAILY   cetirizine (ZYRTEC) 10 MG tablet Take 10 mg by mouth daily.   Cholecalciferol (VITAMIN D-3) 1000 units CAPS Take 1,000 Units by mouth daily.   diazepam (VALIUM) 2 MG tablet Take 2 mg by mouth 2 (two) times daily as needed for anxiety (prior to eye injections).   dorzolamide-timolol (COSOPT)  22.3-6.8 MG/ML ophthalmic solution INSTILL 1 DROP INTO RIGHT EYE TWICE A DAY (Patient taking differently: Place 1 drop into the right eye 2 (two) times daily.)   esomeprazole (NEXIUM) 20 MG capsule Take 20 mg by mouth daily at 12 noon.   FARXIGA 5 MG TABS tablet Take 5 mg by mouth every morning.   fluticasone (FLOVENT HFA) 110 MCG/ACT inhaler Inhale 1 puff into the lungs 2 (two) times daily. Rinse mouth with water after each use   furosemide (LASIX) 40 MG tablet TAKE 1 TABLET (40 MG TOTAL) BY MOUTH DAILY. MAY TAKE EXTRA DAILY AS NEEDED FOR SWELLING (Patient taking differently: Take 40 mg by mouth daily.)   gabapentin (NEURONTIN) 300 MG capsule Take 300 mg by mouth 3 (three) times daily as needed (pain).   hydrALAZINE (APRESOLINE) 25 MG tablet TAKE 1 TABLET BY MOUTH TWO TIMES DAILY AT 8 AM AND 10 PM.   Lancets (ONETOUCH DELICA PLUS LANCET30G) MISC at bedtime.   latanoprost (XALATAN) 0.005 % ophthalmic solution Place 1 drop into the left eye at bedtime.   meclizine (ANTIVERT) 25 MG tablet Take 25 mg by mouth 2 (two) times daily as needed for dizziness.   metoprolol tartrate (LOPRESSOR) 100 MG tablet Take 1 tablet (100 mg total) by mouth once for 1 dose.   Multiple Vitamins-Minerals (PRESERVISION AREDS 2 PO) Take 1 capsule by mouth in the morning and at bedtime.   NON FORMULARY CBD Gummy   olmesartan (BENICAR) 40 MG tablet Take 40 mg by mouth daily.   Omega-3 Fatty Acids (FISH OIL) 1200 MG CAPS Take 1,200 mg by mouth 2 (two) times daily.   ONETOUCH ULTRA test strip 1 each by Other route at bedtime.   Polyethyl Glycol-Propyl Glycol (SYSTANE OP) Place 1 drop into the left eye daily as needed (dry eye).   polyethylene glycol (MIRALAX / GLYCOLAX) packet Take 17 g by mouth daily as needed for moderate constipation.   potassium chloride SA (KLOR-CON M) 20 MEQ tablet TAKE ONE-HALF TABLET BY  MOUTH DAILY (Patient taking differently: Take 10 mEq by mouth See admin instructions. Only takes when taking)    vitamin B-12 (CYANOCOBALAMIN) 100 MCG tablet Take 100 mcg by mouth daily.   warfarin (COUMADIN) 2 MG tablet TAKE 1 TABLET BY MOUTH EVERY DAY AT BEDTIME OR AS DIRECTED BY THE COUMADIN CLINIC   [DISCONTINUED] isosorbide mononitrate (IMDUR) 30 MG 24 hr tablet TAKE ONE TABLET BY MOUTH ONCE DAILY    Physical Exam:    VS:  BP (!) 158/88   Pulse 84  Ht 5\' 2"  (1.575 m)   Wt 157 lb 12.8 oz (71.6 kg)   SpO2 93%   BMI 28.86 kg/m    Wt Readings from Last 3 Encounters:  10/04/23 157 lb 12.8 oz (71.6 kg)  06/06/23 162 lb (73.5 kg)  05/12/23 161 lb (73 kg)    GEN: Well nourished, well developed in no acute distress NECK: No JVD; No carotid bruits CARDIAC: RRR, no murmurs, rubs, gallops RESPIRATORY:  Clear to auscultation without rales, wheezing or rhonchi  ABDOMEN: Soft, non-tender, non-distended EXTREMITIES:  No edema; No acute deformity     Asessement and Plan:.    CAD: Patient with nonobstructive disease by LHC in 05/2015.  MPI in 2021 was normal and low risk. Today she reports 2 episodes of chest discomfort that occurred Saturday and Sunday night, denied any improvement with Tylenol or with Tums.  She did note some increased belching during episodes, relieved after a few hours.  Patient denies any further chest discomfort, denies any chest discomfort on exertion.  Discussed repeat MPI with patient, she deferred, discussed coronary CTA, patient is in agreement with undergoing a coronary CTA.  Will increase Imdur to 60 mg daily.  Reviewed ED precautions. Continue carvedilol 6.25 mg twice daily, Lasix 40 mg daily, hydralazine 25 mg twice daily, olmesartan 40 mg daily, Coumadin. Check BMET.   Chronic diastolic heart failure/lower extremity edema: Today she appears euvolemic and well compensated on exam.  GDMT includes Lasix, Farxiga, carvedilol. Patient instructed to notify office of weight gain of 2 to 3 pounds overnight or 5 pounds in a week. Low-sodium diet and fluid restriction and daily weights  encouraged.  Hypertension: Blood pressure today 158/88.  Given recent chest discomfort will increase her Imdur to 60 mg daily, otherwise continue current antihypertensive regimen.  Patient encouraged to monitor her blood pressure at home and to notify the office if consistently elevated above 130/80.  Prior DVT: On Coumadin.  Denies any bleeding problems.  OSA: CPAP compliance encouraged.  Carotid stenosis s/p left CEA: Carotid duplex in 4/24 indicated bilateral 1 to 39% stenosis.  Continue Praluent.  Patient to have repeat imaging next month.   Disposition: F/u with Reather Littler, NP in one month.   Signed, Rip Harbour, NP

## 2023-10-03 NOTE — Telephone Encounter (Signed)
 Patient returned phone call. Transferred to triage.

## 2023-10-04 ENCOUNTER — Encounter: Payer: Self-pay | Admitting: Cardiology

## 2023-10-04 ENCOUNTER — Ambulatory Visit: Attending: Cardiology | Admitting: Cardiology

## 2023-10-04 VITALS — BP 158/88 | HR 84 | Ht 62.0 in | Wt 157.8 lb

## 2023-10-04 DIAGNOSIS — R072 Precordial pain: Secondary | ICD-10-CM | POA: Diagnosis not present

## 2023-10-04 DIAGNOSIS — I25118 Atherosclerotic heart disease of native coronary artery with other forms of angina pectoris: Secondary | ICD-10-CM

## 2023-10-04 DIAGNOSIS — I10 Essential (primary) hypertension: Secondary | ICD-10-CM

## 2023-10-04 DIAGNOSIS — I5032 Chronic diastolic (congestive) heart failure: Secondary | ICD-10-CM | POA: Diagnosis not present

## 2023-10-04 DIAGNOSIS — I825Z9 Chronic embolism and thrombosis of unspecified deep veins of unspecified distal lower extremity: Secondary | ICD-10-CM

## 2023-10-04 DIAGNOSIS — I6522 Occlusion and stenosis of left carotid artery: Secondary | ICD-10-CM

## 2023-10-04 MED ORDER — ISOSORBIDE MONONITRATE ER 60 MG PO TB24: 60.0000 mg | ORAL_TABLET | Freq: Every day | ORAL | 1 refills | Status: DC

## 2023-10-04 MED ORDER — METOPROLOL TARTRATE 100 MG PO TABS: 100.0000 mg | ORAL_TABLET | Freq: Once | ORAL | 0 refills | Status: DC

## 2023-10-04 NOTE — Patient Instructions (Signed)
 Medication Instructions:  Increase:  Isosorbide Mononitrate (Imdur) to 60 mg once daily (can take two of your 30 mg tablets to equal 60 mg until you pick up new prescription)   Lab Work: Today: BMET  Testing/Procedures:   Your cardiac CT will be scheduled at the below location:   [facility] [address] [place], [place] [address] [phone]   If scheduled at [facility], please arrive at the [facility] and Children's Entrance (Entrance C2) of [facility] 30 minutes prior to test start time. You can use the FREE valet parking offered at entrance C (encouraged to control the heart rate for the test)  Proceed to the [facility] Radiology Department (first floor) to check-in and test prep.  All radiology patients and guests should use entrance C2 at [facility], accessed from [facility], even though the hospital's physical address listed is [address].    Please follow these instructions carefully (unless otherwise directed):  An IV will be required for this test and Nitroglycerin will be given.   On the Night Before the Test: Be sure to Drink plenty of water. Do not consume any caffeinated/decaffeinated beverages or chocolate 12 hours prior to your test. Do not take any antihistamines 12 hours prior to your test.  On the Day of the Test: Drink plenty of water until 1 hour prior to the test. Do not eat any food 1 hour prior to test. You may take your regular medications prior to the test.  Take metoprolol (Lopressor) 100 mg two hours prior to test. If you take Furosemide/Hydrochlorothiazide/Spironolactone/Chlorthalidone, please HOLD on the morning of the test. Patients who wear a continuous glucose monitor MUST remove the device prior to scanning. FEMALES- please wear underwire-free bra if available, avoid dresses & tight clothing       After the Test: Drink plenty of water. After receiving IV contrast, you may  experience a mild flushed feeling. This is normal. On occasion, you may experience a mild rash up to 24 hours after the test. This is not dangerous. If this occurs, you can take Benadryl 25 mg, Zyrtec, Claritin, or Allegra and increase your fluid intake. (Patients taking Tikosyn should avoid Benadryl, and may take Zyrtec, Claritin, or Allegra) If you experience trouble breathing, this can be serious. If it is severe call 911 IMMEDIATELY. If it is mild, please call our office.  We will call to schedule your test 2-4 weeks out understanding that some insurance companies will need an authorization prior to the service being performed.   For more information and frequently asked questions, please visit our website : [email or web address]  For non-scheduling related questions, please contact the cardiac imaging nurse navigator should you have any questions/concerns: Cardiac Imaging Nurse Navigators Direct Office Dial: [phone]   For scheduling needs, including cancellations and rescheduling, please call [place], [phone].   Follow-Up: At [facility], you and your health needs are our priority.  As part of our continuing mission to provide you with exceptional heart care, our providers are all part of one team.  This team includes your primary Cardiologist (physician) and Advanced Practice Providers or APPs (Physician Assistants and Nurse Practitioners) who all work together to provide you with the care you need, when you need it.  Your next appointment:    12/16/2023  Provider:   Chilton Si, MD   We recommend signing up for the patient portal called "MyChart".  Sign up information is provided on this  After Visit Summary.  MyChart is used to connect with patients for Virtual Visits (Telemedicine).  Patients are able to view lab/test results, encounter notes, upcoming appointments, etc.  Non-urgent messages can be sent to your provider as well.   To learn more  about what you can do with MyChart, go to [email or web address].    Other Instructions Please check your Blood pressure once daily and send [place] your numbers after two weeks.  Please refrain from Sodium rich type of foods (See the following)   [place]:

## 2023-10-05 ENCOUNTER — Encounter: Payer: Self-pay | Admitting: Cardiology

## 2023-10-05 LAB — BASIC METABOLIC PANEL WITH GFR
BUN/Creatinine Ratio: 18 (ref 12–28)
BUN: 16 mg/dL (ref 8–27)
CO2: 23 mmol/L (ref 20–29)
Calcium: 9.9 mg/dL (ref 8.7–10.3)
Chloride: 106 mmol/L (ref 96–106)
Creatinine, Ser: 0.88 mg/dL (ref 0.57–1.00)
Glucose: 84 mg/dL (ref 70–99)
Potassium: 4.2 mmol/L (ref 3.5–5.2)
Sodium: 144 mmol/L (ref 134–144)
eGFR: 66 mL/min/{1.73_m2} (ref >59)

## 2023-10-09 ENCOUNTER — Inpatient Hospital Stay (HOSPITAL_COMMUNITY)
Admission: EM | Admit: 2023-10-09 | Discharge: 2023-10-12 | DRG: 281 | Disposition: A | Discharge status: Home / Self Care | Attending: Internal Medicine | Admitting: Internal Medicine

## 2023-10-09 ENCOUNTER — Encounter (HOSPITAL_COMMUNITY): Payer: Self-pay

## 2023-10-09 ENCOUNTER — Other Ambulatory Visit: Payer: Self-pay

## 2023-10-09 ENCOUNTER — Emergency Department (HOSPITAL_COMMUNITY)

## 2023-10-09 DIAGNOSIS — I214 Non-ST elevation (NSTEMI) myocardial infarction: Secondary | ICD-10-CM | POA: Diagnosis not present

## 2023-10-09 DIAGNOSIS — I10 Essential (primary) hypertension: Secondary | ICD-10-CM | POA: Diagnosis present

## 2023-10-09 DIAGNOSIS — Z95820 Peripheral vascular angioplasty status with implants and grafts: Secondary | ICD-10-CM

## 2023-10-09 DIAGNOSIS — I700 Atherosclerosis of aorta: Secondary | ICD-10-CM | POA: Diagnosis present

## 2023-10-09 DIAGNOSIS — Z7984 Long term (current) use of oral hypoglycemic drugs: Secondary | ICD-10-CM

## 2023-10-09 DIAGNOSIS — I774 Celiac artery compression syndrome: Secondary | ICD-10-CM | POA: Diagnosis present

## 2023-10-09 DIAGNOSIS — I350 Nonrheumatic aortic (valve) stenosis: Secondary | ICD-10-CM | POA: Diagnosis present

## 2023-10-09 DIAGNOSIS — E1122 Type 2 diabetes mellitus with diabetic chronic kidney disease: Secondary | ICD-10-CM | POA: Diagnosis present

## 2023-10-09 DIAGNOSIS — Z79899 Other long term (current) drug therapy: Secondary | ICD-10-CM

## 2023-10-09 DIAGNOSIS — E119 Type 2 diabetes mellitus without complications: Secondary | ICD-10-CM

## 2023-10-09 DIAGNOSIS — I6529 Occlusion and stenosis of unspecified carotid artery: Secondary | ICD-10-CM | POA: Diagnosis present

## 2023-10-09 DIAGNOSIS — J4489 Other specified chronic obstructive pulmonary disease: Secondary | ICD-10-CM | POA: Diagnosis present

## 2023-10-09 DIAGNOSIS — Z87891 Personal history of nicotine dependence: Secondary | ICD-10-CM

## 2023-10-09 DIAGNOSIS — R079 Chest pain, unspecified: Secondary | ICD-10-CM | POA: Diagnosis not present

## 2023-10-09 DIAGNOSIS — Z7982 Long term (current) use of aspirin: Secondary | ICD-10-CM

## 2023-10-09 DIAGNOSIS — I5032 Chronic diastolic (congestive) heart failure: Secondary | ICD-10-CM | POA: Diagnosis present

## 2023-10-09 DIAGNOSIS — I4510 Unspecified right bundle-branch block: Secondary | ICD-10-CM | POA: Diagnosis present

## 2023-10-09 DIAGNOSIS — Z803 Family history of malignant neoplasm of breast: Secondary | ICD-10-CM

## 2023-10-09 DIAGNOSIS — Z8249 Family history of ischemic heart disease and other diseases of the circulatory system: Secondary | ICD-10-CM

## 2023-10-09 DIAGNOSIS — N189 Chronic kidney disease, unspecified: Secondary | ICD-10-CM | POA: Diagnosis present

## 2023-10-09 DIAGNOSIS — K551 Chronic vascular disorders of intestine: Secondary | ICD-10-CM

## 2023-10-09 DIAGNOSIS — E782 Mixed hyperlipidemia: Secondary | ICD-10-CM | POA: Diagnosis present

## 2023-10-09 DIAGNOSIS — Z888 Allergy status to other drugs, medicaments and biological substances status: Secondary | ICD-10-CM

## 2023-10-09 DIAGNOSIS — G4733 Obstructive sleep apnea (adult) (pediatric): Secondary | ICD-10-CM | POA: Diagnosis present

## 2023-10-09 DIAGNOSIS — K219 Gastro-esophageal reflux disease without esophagitis: Secondary | ICD-10-CM | POA: Diagnosis present

## 2023-10-09 DIAGNOSIS — Z880 Allergy status to penicillin: Secondary | ICD-10-CM

## 2023-10-09 DIAGNOSIS — I959 Hypotension, unspecified: Secondary | ICD-10-CM

## 2023-10-09 DIAGNOSIS — Z86718 Personal history of other venous thrombosis and embolism: Secondary | ICD-10-CM

## 2023-10-09 DIAGNOSIS — I130 Hypertensive heart and chronic kidney disease with heart failure and stage 1 through stage 4 chronic kidney disease, or unspecified chronic kidney disease: Secondary | ICD-10-CM | POA: Diagnosis present

## 2023-10-09 DIAGNOSIS — Z885 Allergy status to narcotic agent status: Secondary | ICD-10-CM

## 2023-10-09 DIAGNOSIS — N179 Acute kidney failure, unspecified: Principal | ICD-10-CM | POA: Diagnosis present

## 2023-10-09 DIAGNOSIS — I2510 Atherosclerotic heart disease of native coronary artery without angina pectoris: Secondary | ICD-10-CM | POA: Diagnosis present

## 2023-10-09 DIAGNOSIS — Z853 Personal history of malignant neoplasm of breast: Secondary | ICD-10-CM

## 2023-10-09 DIAGNOSIS — Z886 Allergy status to analgesic agent status: Secondary | ICD-10-CM

## 2023-10-09 DIAGNOSIS — Z7951 Long term (current) use of inhaled steroids: Secondary | ICD-10-CM

## 2023-10-09 DIAGNOSIS — Z7901 Long term (current) use of anticoagulants: Secondary | ICD-10-CM

## 2023-10-09 DIAGNOSIS — Z823 Family history of stroke: Secondary | ICD-10-CM

## 2023-10-09 LAB — HEPATIC FUNCTION PANEL
ALT: 16 U/L (ref 0–44)
AST: 14 U/L — ABNORMAL LOW (ref 15–41)
Albumin: 3.5 g/dL (ref 3.5–5.0)
Alkaline Phosphatase: 75 U/L (ref 38–126)
Bilirubin, Direct: 0.1 mg/dL (ref 0.0–0.2)
Indirect Bilirubin: 0.3 mg/dL (ref 0.3–0.9)
Total Bilirubin: 0.4 mg/dL (ref 0.0–1.2)
Total Protein: 6 g/dL — ABNORMAL LOW (ref 6.5–8.1)

## 2023-10-09 LAB — BASIC METABOLIC PANEL WITH GFR
Anion gap: 11 (ref 5–15)
BUN: 26 mg/dL — ABNORMAL HIGH (ref 8–23)
CO2: 22 mmol/L (ref 22–32)
Calcium: 9.5 mg/dL (ref 8.9–10.3)
Chloride: 103 mmol/L (ref 98–111)
Creatinine, Ser: 1.55 mg/dL — ABNORMAL HIGH (ref 0.44–1.00)
GFR, Estimated: 33 mL/min — ABNORMAL LOW (ref >60)
Glucose, Bld: 145 mg/dL — ABNORMAL HIGH (ref 70–99)
Potassium: 3.9 mmol/L (ref 3.5–5.1)
Sodium: 136 mmol/L (ref 135–145)

## 2023-10-09 LAB — CBC
HCT: 42.3 % (ref 36.0–46.0)
Hemoglobin: 13.2 g/dL (ref 12.0–15.0)
MCH: 29.9 pg (ref 26.0–34.0)
MCHC: 31.2 g/dL (ref 30.0–36.0)
MCV: 95.7 fL (ref 80.0–100.0)
Platelets: 223 10*3/uL (ref 150–400)
RBC: 4.42 MIL/uL (ref 3.87–5.11)
RDW: 12.7 % (ref 11.5–15.5)
WBC: 7.9 10*3/uL (ref 4.0–10.5)
nRBC: 0 % (ref 0.0–0.2)

## 2023-10-09 LAB — PROTIME-INR
INR: 1.7 — ABNORMAL HIGH (ref 0.8–1.2)
Prothrombin Time: 20.2 s — ABNORMAL HIGH (ref 11.4–15.2)

## 2023-10-09 LAB — TROPONIN I (HIGH SENSITIVITY): Troponin I (High Sensitivity): 99 ng/L — ABNORMAL HIGH (ref <18)

## 2023-10-09 LAB — LIPASE, BLOOD: Lipase: 43 U/L (ref 11–51)

## 2023-10-09 MED ORDER — ALUM & MAG HYDROXIDE-SIMETH 200-200-20 MG/5ML PO SUSP
30.0000 mL | Freq: Once | ORAL | Status: AC
  Administered 2023-10-09: 30 mL via ORAL
  Filled 2023-10-09: qty 30

## 2023-10-09 MED ORDER — ONDANSETRON HCL 4 MG/2ML IJ SOLN
4.0000 mg | Freq: Once | INTRAMUSCULAR | Status: AC
  Administered 2023-10-09: 4 mg via INTRAVENOUS
  Filled 2023-10-09: qty 2

## 2023-10-09 MED ORDER — ACETAMINOPHEN 325 MG PO TABS
650.0000 mg | ORAL_TABLET | Freq: Once | ORAL | Status: AC
  Administered 2023-10-09: 650 mg via ORAL
  Filled 2023-10-09: qty 2

## 2023-10-09 MED ORDER — IOHEXOL 350 MG/ML SOLN
80.0000 mL | Freq: Once | INTRAVENOUS | Status: AC | PRN
  Administered 2023-10-09: 80 mL via INTRAVENOUS

## 2023-10-09 MED ORDER — FAMOTIDINE 20 MG PO TABS
20.0000 mg | ORAL_TABLET | Freq: Once | ORAL | Status: AC
  Administered 2023-10-09: 20 mg via ORAL
  Filled 2023-10-09: qty 1

## 2023-10-09 MED ORDER — SODIUM CHLORIDE 0.9 % IV BOLUS
1000.0000 mL | Freq: Once | INTRAVENOUS | Status: AC
  Administered 2023-10-09: 1000 mL via INTRAVENOUS

## 2023-10-09 NOTE — ED Provider Notes (Signed)
 [place] EMERGENCY DEPARTMENT AT [facility] Provider Note   CSN: [id number] Arrival date & time: 10/09/23  2129     History {Add pertinent medical, surgical, social history, OB history to HPI:1} Chief Complaint  Patient presents with   Chest Pain    [name] is a 83 y.o. female.  With past medical history of carotid stenosis, obstructive sleep apnea, coronary artery disease, congestive heart failure, COPD presenting to emergency room with complaint of chest pain.  Patient reports that earlier today she started having central chest pain that seems to be radiating to her back. It started sometime this morning. The pain dose come and go. She reports it is associated with some nausea. She reports she has had some change in vision but cannot specifically explain how.  Denies any change in peripheral fields.  She reports she took her blood pressure medication earlier today but nothing this evening she denies any recent nausea vomiting or diarrhea. No recent injury or fall. She is on Lasix and Olmesartan.    Chest Pain      Home Medications Prior to Admission medications   Medication Sig Start Date End Date Taking? Authorizing Provider  albuterol (PROVENTIL HFA;VENTOLIN HFA) 108 (90 BASE) MCG/ACT inhaler Inhale 2 puffs into the lungs every 4 (four) hours as needed for shortness of breath. 06/04/15   Chilton Si, MD  albuterol (PROVENTIL) (2.5 MG/3ML) 0.083% nebulizer solution Take 2.5 mg by nebulization every 6 (six) hours as needed for wheezing or shortness of breath.    [provider]  Alirocumab (PRALUENT) 75 MG/ML SOAJ Inject 1 mL (75 mg total) into the skin every 14 (fourteen) days. Patient not taking: Reported on 10/04/2023 03/09/23   Chilton Si, MD  amitriptyline (ELAVIL) 25 MG tablet Take 25 mg by mouth at bedtime.    [provider]  anastrozole (ARIMIDEX) 1 MG tablet Take 1 tablet (1 mg total) by mouth at bedtime. 04/13/23   [name], MD  carvedilol (COREG) 6.25 MG tablet TAKE ONE TABLET BY MOUTH TWICE DAILY 02/23/23   Chilton Si, MD  cetirizine (ZYRTEC) 10 MG tablet Take 10 mg by mouth daily.    [provider]  Cholecalciferol (VITAMIN D-3) 1000 units CAPS Take 1,000 Units by mouth daily.    [provider]  diazepam (VALIUM) 2 MG tablet Take 2 mg by mouth 2 (two) times daily as needed for anxiety (prior to eye injections). 04/10/20   [provider]  dorzolamide-timolol (COSOPT) 22.3-6.8 MG/ML ophthalmic solution INSTILL 1 DROP INTO RIGHT EYE TWICE A DAY Patient taking differently: Place 1 drop into the right eye 2 (two) times daily. 08/28/20   Rennis Chris, MD  esomeprazole (NEXIUM) 20 MG capsule Take 20 mg by mouth daily at 12 noon.    [provider]  FARXIGA 5 MG TABS tablet Take 5 mg by mouth every morning. 10/01/20   [provider]  fluticasone (FLOVENT HFA) 110 MCG/ACT inhaler Inhale 1 puff into the lungs 2 (two) times daily. Rinse mouth with water after each use 06/06/22   Particia Nearing, PA-C  furosemide (LASIX) 40 MG tablet TAKE 1 TABLET (40 MG TOTAL) BY MOUTH DAILY. MAY TAKE EXTRA DAILY AS NEEDED FOR SWELLING Patient taking differently: Take 40 mg by mouth daily. 04/15/21   Chilton Si, MD  gabapentin (NEURONTIN) 300 MG capsule Take 300 mg by mouth 3 (three) times daily as needed (pain). 05/08/19   [provider]  glimepiride (AMARYL) 2 MG  tablet Take 2 mg by mouth daily. Patient not taking: Reported on 10/04/2023 08/26/17   [provider]  hydrALAZINE (APRESOLINE) 25 MG tablet TAKE 1 TABLET BY MOUTH TWO TIMES DAILY AT 8 AM AND 10 PM. 06/17/23   Chilton Si, MD  isosorbide mononitrate (IMDUR) 60 MG 24 hr tablet Take 1 tablet (60 mg total) by mouth daily. 10/04/23   Reather Littler D, NP  Lancets [facility] DELICA PLUS LANCET30G) MISC at bedtime. 09/14/23   [provider]  latanoprost (XALATAN) 0.005 % ophthalmic solution  Place 1 drop into the left eye at bedtime.    [provider]  meclizine (ANTIVERT) 25 MG tablet Take 25 mg by mouth 2 (two) times daily as needed for dizziness.    [provider]  metoprolol tartrate (LOPRESSOR) 100 MG tablet Take 1 tablet (100 mg total) by mouth once for 1 dose. 10/04/23 10/04/23  Reather Littler D, NP  Multiple Vitamins-Minerals (PRESERVISION AREDS 2 PO) Take 1 capsule by mouth in the morning and at bedtime.    [provider]  NON FORMULARY CBD Gummy    [provider]  olmesartan (BENICAR) 40 MG tablet Take 40 mg by mouth daily. 09/15/23   [provider]  Omega-3 Fatty Acids (FISH OIL) 1200 MG CAPS Take 1,200 mg by mouth 2 (two) times daily.    [provider]  ondansetron (ZOFRAN-ODT) 4 MG disintegrating tablet Take 1 tablet (4 mg total) by mouth every 8 (eight) hours as needed. Patient not taking: Reported on 10/04/2023 11/15/22   Jacalyn Lefevre, MD  [facility] ULTRA test strip 1 each by Other route at bedtime. 09/14/23   [provider]  Polyethyl Glycol-Propyl Glycol (SYSTANE OP) Place 1 drop into the left eye daily as needed (dry eye).    [provider]  polyethylene glycol (MIRALAX / GLYCOLAX) packet Take 17 g by mouth daily as needed for moderate constipation.    [provider]  potassium chloride SA (KLOR-CON M) 20 MEQ tablet TAKE ONE-HALF TABLET BY  MOUTH DAILY Patient taking differently: Take 10 mEq by mouth See admin instructions. Only takes when taking 08/11/21   Chilton Si, MD  promethazine-dextromethorphan (PROMETHAZINE-DM) 6.25-15 MG/5ML syrup Take 5 mLs by mouth 4 (four) times daily as needed. Patient not taking: Reported on 10/04/2023 06/06/22   Particia Nearing, PA-C  tamsulosin (FLOMAX) 0.4 MG CAPS capsule Take 1 capsule (0.4 mg total) by mouth daily. Patient not taking: Reported on 10/04/2023 11/15/22   Jacalyn Lefevre, MD  vitamin B-12 (CYANOCOBALAMIN) 100 MCG tablet Take 100 mcg  by mouth daily. 03/04/14   [provider]  warfarin (COUMADIN) 2 MG tablet TAKE 1 TABLET BY MOUTH EVERY DAY AT BEDTIME OR AS DIRECTED BY THE COUMADIN CLINIC 04/01/23   Chilton Si, MD      Allergies    Alphagan [brimonidine], Diflunisal, Vioxx [rofecoxib], Metformin and related, Nexlizet [bempedoic acid-ezetimibe], Repatha [evolocumab], Codeine, Elemental sulfur, Motrin [ibuprofen], Penicillins, and Pravastatin    Review of Systems   Review of Systems  Cardiovascular:  Positive for chest pain.    Physical Exam Updated Vital Signs BP (!) 98/47   Pulse 74   Resp 10   Ht 5\' 2"  (1.575 m)   Wt 70.3 kg   SpO2 93%   BMI 28.35 kg/m  Physical Exam Vitals and nursing note reviewed.  Constitutional:      General: She is not in acute distress.    Appearance: She is not toxic-appearing.  HENT:  Head: Normocephalic and atraumatic.  Eyes:     General: No scleral icterus.    Conjunctiva/sclera: Conjunctivae normal.  Cardiovascular:     Rate and Rhythm: Normal rate and regular rhythm.     Pulses: Normal pulses.     Heart sounds: Normal heart sounds.  Pulmonary:     Effort: Pulmonary effort is normal. No respiratory distress.     Breath sounds: Normal breath sounds.  Abdominal:     General: Abdomen is flat. Bowel sounds are normal.     Palpations: Abdomen is soft.     Tenderness: There is no abdominal tenderness.  Musculoskeletal:     Right lower leg: No edema.     Left lower leg: No edema.  Skin:    General: Skin is warm and dry.     Findings: No lesion.  Neurological:     General: No focal deficit present.     Mental Status: She is alert and oriented to person, place, and time. Mental status is at baseline.     ED Results / Procedures / Treatments   Labs (all labs ordered are listed, but only abnormal results are displayed) Labs Reviewed  BASIC METABOLIC PANEL WITH GFR - Abnormal; Notable for the following components:      Result Value   Glucose, Bld 145  (*)    BUN 26 (*)    Creatinine, Ser 1.55 (*)    GFR, Estimated 33 (*)    All other components within normal limits  TROPONIN I (HIGH SENSITIVITY) - Abnormal; Notable for the following components:   Troponin I (High Sensitivity) 99 (*)    All other components within normal limits  CBC  PROTIME-INR  LIPASE, BLOOD  HEPATIC FUNCTION PANEL    EKG None  Radiology DG Chest 2 View Result Date: 10/09/2023 CLINICAL DATA:  Chest pain. EXAM: CHEST - 2 VIEW COMPARISON:  Radiograph and CT 06/06/2023 FINDINGS: The cardiomediastinal contours are normal. Biapical pleuroparenchymal scarring. Pulmonary vasculature is normal. No consolidation, pleural effusion, or pneumothorax. No acute osseous abnormalities are seen. IMPRESSION: No active cardiopulmonary disease. Electronically Signed   By: Narda Rutherford M.D.   On: 10/09/2023 22:23    Procedures Procedures  {Document cardiac monitor, telemetry assessment procedure when appropriate:1}  Medications Ordered in ED Medications  famotidine (PEPCID) tablet 20 mg (has no administration in time range)  alum & mag hydroxide-simeth (MAALOX/MYLANTA) 200-200-20 MG/5ML suspension 30 mL (has no administration in time range)  acetaminophen (TYLENOL) tablet 650 mg (has no administration in time range)  sodium chloride 0.9 % bolus 1,000 mL (1,000 mLs Intravenous New Bag/Given 10/09/23 2238)  ondansetron (ZOFRAN) injection 4 mg (4 mg Intravenous Given 10/09/23 2238)    ED Course/ Medical Decision Making/ A&P   {   Click here for ABCD2, HEART and other calculatorsREFRESH Note before signing :1}                              Medical Decision Making Amount and/or Complexity of Data Reviewed Labs: ordered. Radiology: ordered.  Risk OTC drugs. Prescription drug management.   [name] 83 y.o. presented today for chest pain. Working DDx that I considered at this time includes, but not limited to, ACS, GERD, pe, pna, aortic dissection, pneumothorax, MSK  path, anemia, esophageal rupture, CHF exacerbation, valvular disorder, myocarditis, pericarditis, endocarditis, pericardial effusion/cardiac tamponade, pulmonary edema, gastritis/PUD, esophagitis.  PMHX: carotid stenosis, obstructive sleep apnea, coronary artery disease, congestive heart failure EF  75%, COPD, DVT on Warfarin.   Review of prior external notes: 10/04/23 cardiology visit  Unique Tests and My Interpretation:  EKG: Rate, rhythm, axis, intervals all examined: sinus, RBBB Troponin: 99, repeat pending - CXR: negative  CTA chest, abd, pelvis pending.  CBC: No leukocytosis no anemia BMP: Creatinine 1.55, GFR 33 which is much higher than prior recent labs suspect this is likely secondary to Lasix and olmesartan. Lipase, hepatic function panel, pt/inr pending   Problem List / ED Course / Critical interventions / Medication management  ***  I ordered medication including NS, zofran  Reevaluation of the patient after these medicines showed that the patient {resolved/improved/worsened:23923::"improved"} Patients vitals assessed. Upon arrival patient is  hemodynamically stable.  I have reviewed the patients home medicines and have made adjustments as needed     Plan:    {Document critical care time when appropriate:1} {Document review of labs and clinical decision tools ie heart score, Chads2Vasc2 etc:1}  {Document your independent review of radiology images, and any outside records:1} {Document your discussion with family members, caretakers, and with consultants:1} {Document social determinants of health affecting pt's care:1} {Document your decision making why or why not admission, treatments were needed:1} Final Clinical Impression(s) / ED Diagnoses Final diagnoses:  AKI (acute kidney injury) (HCC)  Chest pain, unspecified type    Rx / DC Orders ED Discharge Orders     None

## 2023-10-09 NOTE — ED Triage Notes (Signed)
 Pt stated that she has been having chest pain that started today. Pt also complaining of blurry vision and nausea

## 2023-10-09 NOTE — ED Notes (Signed)
 ED Provider at bedside.

## 2023-10-09 NOTE — ED Notes (Signed)
 Patient transported to CT

## 2023-10-10 DIAGNOSIS — G4733 Obstructive sleep apnea (adult) (pediatric): Secondary | ICD-10-CM | POA: Diagnosis present

## 2023-10-10 DIAGNOSIS — R0789 Other chest pain: Secondary | ICD-10-CM | POA: Diagnosis not present

## 2023-10-10 DIAGNOSIS — I350 Nonrheumatic aortic (valve) stenosis: Secondary | ICD-10-CM | POA: Diagnosis present

## 2023-10-10 DIAGNOSIS — I774 Celiac artery compression syndrome: Secondary | ICD-10-CM | POA: Diagnosis present

## 2023-10-10 DIAGNOSIS — Z7951 Long term (current) use of inhaled steroids: Secondary | ICD-10-CM | POA: Diagnosis not present

## 2023-10-10 DIAGNOSIS — I6529 Occlusion and stenosis of unspecified carotid artery: Secondary | ICD-10-CM | POA: Diagnosis present

## 2023-10-10 DIAGNOSIS — J4489 Other specified chronic obstructive pulmonary disease: Secondary | ICD-10-CM | POA: Diagnosis present

## 2023-10-10 DIAGNOSIS — I214 Non-ST elevation (NSTEMI) myocardial infarction: Secondary | ICD-10-CM | POA: Diagnosis present

## 2023-10-10 DIAGNOSIS — Z86718 Personal history of other venous thrombosis and embolism: Secondary | ICD-10-CM | POA: Diagnosis not present

## 2023-10-10 DIAGNOSIS — E782 Mixed hyperlipidemia: Secondary | ICD-10-CM | POA: Diagnosis present

## 2023-10-10 DIAGNOSIS — I5032 Chronic diastolic (congestive) heart failure: Secondary | ICD-10-CM

## 2023-10-10 DIAGNOSIS — E119 Type 2 diabetes mellitus without complications: Secondary | ICD-10-CM | POA: Diagnosis not present

## 2023-10-10 DIAGNOSIS — I130 Hypertensive heart and chronic kidney disease with heart failure and stage 1 through stage 4 chronic kidney disease, or unspecified chronic kidney disease: Secondary | ICD-10-CM | POA: Diagnosis present

## 2023-10-10 DIAGNOSIS — K219 Gastro-esophageal reflux disease without esophagitis: Secondary | ICD-10-CM | POA: Diagnosis present

## 2023-10-10 DIAGNOSIS — I2510 Atherosclerotic heart disease of native coronary artery without angina pectoris: Secondary | ICD-10-CM | POA: Diagnosis present

## 2023-10-10 DIAGNOSIS — N189 Chronic kidney disease, unspecified: Secondary | ICD-10-CM | POA: Diagnosis present

## 2023-10-10 DIAGNOSIS — E1122 Type 2 diabetes mellitus with diabetic chronic kidney disease: Secondary | ICD-10-CM | POA: Diagnosis present

## 2023-10-10 DIAGNOSIS — Z87891 Personal history of nicotine dependence: Secondary | ICD-10-CM | POA: Diagnosis not present

## 2023-10-10 DIAGNOSIS — I10 Essential (primary) hypertension: Secondary | ICD-10-CM

## 2023-10-10 DIAGNOSIS — Z7984 Long term (current) use of oral hypoglycemic drugs: Secondary | ICD-10-CM | POA: Diagnosis not present

## 2023-10-10 DIAGNOSIS — Z7901 Long term (current) use of anticoagulants: Secondary | ICD-10-CM | POA: Diagnosis not present

## 2023-10-10 DIAGNOSIS — I700 Atherosclerosis of aorta: Secondary | ICD-10-CM | POA: Diagnosis present

## 2023-10-10 DIAGNOSIS — I4510 Unspecified right bundle-branch block: Secondary | ICD-10-CM | POA: Diagnosis present

## 2023-10-10 DIAGNOSIS — R079 Chest pain, unspecified: Secondary | ICD-10-CM | POA: Diagnosis present

## 2023-10-10 DIAGNOSIS — Z888 Allergy status to other drugs, medicaments and biological substances status: Secondary | ICD-10-CM | POA: Diagnosis not present

## 2023-10-10 DIAGNOSIS — Z79899 Other long term (current) drug therapy: Secondary | ICD-10-CM | POA: Diagnosis not present

## 2023-10-10 DIAGNOSIS — Z8249 Family history of ischemic heart disease and other diseases of the circulatory system: Secondary | ICD-10-CM | POA: Diagnosis not present

## 2023-10-10 DIAGNOSIS — N179 Acute kidney failure, unspecified: Secondary | ICD-10-CM | POA: Diagnosis present

## 2023-10-10 LAB — GLUCOSE, CAPILLARY
Glucose-Capillary: 101 mg/dL — ABNORMAL HIGH (ref 70–99)
Glucose-Capillary: 122 mg/dL — ABNORMAL HIGH (ref 70–99)
Glucose-Capillary: 140 mg/dL — ABNORMAL HIGH (ref 70–99)

## 2023-10-10 LAB — PROTIME-INR
INR: 2 — ABNORMAL HIGH (ref 0.8–1.2)
Prothrombin Time: 22.5 s — ABNORMAL HIGH (ref 11.4–15.2)

## 2023-10-10 LAB — HEMOGLOBIN A1C
Hgb A1c MFr Bld: 7 % — ABNORMAL HIGH (ref 4.8–5.6)
Mean Plasma Glucose: 154.2 mg/dL

## 2023-10-10 LAB — BASIC METABOLIC PANEL WITH GFR
Anion gap: 7 (ref 5–15)
BUN: 17 mg/dL (ref 8–23)
CO2: 26 mmol/L (ref 22–32)
Calcium: 9.1 mg/dL (ref 8.9–10.3)
Chloride: 108 mmol/L (ref 98–111)
Creatinine, Ser: 1.11 mg/dL — ABNORMAL HIGH (ref 0.44–1.00)
GFR, Estimated: 49 mL/min — ABNORMAL LOW (ref >60)
Glucose, Bld: 111 mg/dL — ABNORMAL HIGH (ref 70–99)
Potassium: 4 mmol/L (ref 3.5–5.1)
Sodium: 141 mmol/L (ref 135–145)

## 2023-10-10 LAB — HEPARIN LEVEL (UNFRACTIONATED)
Heparin Unfractionated: 0.47 [IU]/mL (ref 0.30–0.70)
Heparin Unfractionated: 0.4 [IU]/mL (ref 0.30–0.70)

## 2023-10-10 LAB — TROPONIN I (HIGH SENSITIVITY): Troponin I (High Sensitivity): 112 ng/L (ref <18)

## 2023-10-10 MED ORDER — INSULIN ASPART 100 UNIT/ML IJ SOLN: 0.0000 [IU] | Freq: Three times a day (TID) | INTRAMUSCULAR | Status: DC

## 2023-10-10 MED ORDER — SODIUM CHLORIDE 0.9% FLUSH
3.0000 mL | Freq: Two times a day (BID) | INTRAVENOUS | Status: DC
  Administered 2023-10-10 – 2023-10-12 (×4): 3 mL via INTRAVENOUS

## 2023-10-10 MED ORDER — INSULIN ASPART 100 UNIT/ML IJ SOLN: 0.0000 [IU] | Freq: Every day | INTRAMUSCULAR | Status: DC

## 2023-10-10 MED ORDER — ACETAMINOPHEN 325 MG PO TABS
650.0000 mg | ORAL_TABLET | Freq: Four times a day (QID) | ORAL | Status: DC | PRN
  Administered 2023-10-10: 650 mg via ORAL
  Filled 2023-10-10: qty 2

## 2023-10-10 MED ORDER — ACETAMINOPHEN 650 MG RE SUPP: 650.0000 mg | Freq: Four times a day (QID) | RECTAL | Status: DC | PRN

## 2023-10-10 MED ORDER — METOPROLOL TARTRATE 25 MG PO TABS
12.5000 mg | ORAL_TABLET | Freq: Two times a day (BID) | ORAL | Status: DC
Start: 2023-10-10 — End: 2023-10-12
  Administered 2023-10-10 – 2023-10-12 (×4): 12.5 mg via ORAL
  Filled 2023-10-10 (×5): qty 1

## 2023-10-10 MED ORDER — LACTATED RINGERS IV BOLUS
1000.0000 mL | Freq: Once | INTRAVENOUS | Status: AC
  Administered 2023-10-10: 1000 mL via INTRAVENOUS

## 2023-10-10 MED ORDER — ONDANSETRON HCL 4 MG/2ML IJ SOLN: 4.0000 mg | Freq: Four times a day (QID) | INTRAMUSCULAR | Status: DC | PRN

## 2023-10-10 MED ORDER — DIAZEPAM 2 MG PO TABS: 2.0000 mg | ORAL_TABLET | Freq: Two times a day (BID) | ORAL | Status: DC | PRN

## 2023-10-10 MED ORDER — AMITRIPTYLINE HCL 25 MG PO TABS
25.0000 mg | ORAL_TABLET | Freq: Every day | ORAL | Status: DC
  Administered 2023-10-10 – 2023-10-11 (×2): 25 mg via ORAL
  Filled 2023-10-10 (×2): qty 1

## 2023-10-10 MED ORDER — ONDANSETRON HCL 4 MG PO TABS: 4.0000 mg | ORAL_TABLET | Freq: Four times a day (QID) | ORAL | Status: DC | PRN

## 2023-10-10 MED ORDER — SODIUM CHLORIDE 0.9% FLUSH: 3.0000 mL | INTRAVENOUS | Status: DC | PRN

## 2023-10-10 MED ORDER — ISOSORBIDE MONONITRATE ER 60 MG PO TB24
60.0000 mg | ORAL_TABLET | Freq: Every day | ORAL | Status: DC
  Administered 2023-10-10 – 2023-10-12 (×3): 60 mg via ORAL
  Filled 2023-10-10 (×3): qty 1

## 2023-10-10 MED ORDER — SODIUM CHLORIDE 0.9% FLUSH
3.0000 mL | Freq: Two times a day (BID) | INTRAVENOUS | Status: DC
  Administered 2023-10-10 – 2023-10-11 (×2): 3 mL via INTRAVENOUS

## 2023-10-10 MED ORDER — HEPARIN (PORCINE) 25000 UT/250ML-% IV SOLN
850.0000 [IU]/h | INTRAVENOUS | Status: DC
  Administered 2023-10-10 – 2023-10-11 (×2): 850 [IU]/h via INTRAVENOUS
  Filled 2023-10-10 (×2): qty 250

## 2023-10-10 MED ORDER — ANASTROZOLE 1 MG PO TABS
1.0000 mg | ORAL_TABLET | Freq: Every day | ORAL | Status: DC
  Administered 2023-10-10 – 2023-10-11 (×2): 1 mg via ORAL
  Filled 2023-10-10 (×4): qty 1

## 2023-10-10 MED ORDER — TRAZODONE HCL 50 MG PO TABS: 50.0000 mg | ORAL_TABLET | Freq: Every evening | ORAL | Status: DC | PRN

## 2023-10-10 MED ORDER — POLYVINYL ALCOHOL 1.4 % OP SOLN: 1.0000 [drp] | OPHTHALMIC | Status: DC | PRN

## 2023-10-10 MED ORDER — VITAMIN B-12 100 MCG PO TABS
100.0000 ug | ORAL_TABLET | Freq: Every day | ORAL | Status: DC
  Administered 2023-10-10 – 2023-10-12 (×3): 100 ug via ORAL
  Filled 2023-10-10 (×3): qty 1

## 2023-10-10 MED ORDER — ASPIRIN 81 MG PO TBEC
81.0000 mg | DELAYED_RELEASE_TABLET | Freq: Every day | ORAL | Status: DC
  Administered 2023-10-10 – 2023-10-12 (×3): 81 mg via ORAL
  Filled 2023-10-10 (×3): qty 1

## 2023-10-10 MED ORDER — ALBUTEROL SULFATE (2.5 MG/3ML) 0.083% IN NEBU: 2.5000 mg | INHALATION_SOLUTION | RESPIRATORY_TRACT | Status: DC | PRN

## 2023-10-10 MED ORDER — HEPARIN BOLUS VIA INFUSION
2000.0000 [IU] | Freq: Once | INTRAVENOUS | Status: AC
  Administered 2023-10-10: 2000 [IU] via INTRAVENOUS

## 2023-10-10 MED ORDER — LACTATED RINGERS IV SOLN: INTRAVENOUS | Status: AC

## 2023-10-10 MED ORDER — POLYETHYLENE GLYCOL 3350 17 G PO PACK: 17.0000 g | PACK | Freq: Every day | ORAL | Status: DC | PRN

## 2023-10-10 MED ORDER — POLYETHYL GLYCOL-PROPYL GLYCOL 0.4-0.3 % OP GEL: Freq: Every day | OPHTHALMIC | Status: DC | PRN

## 2023-10-10 MED ORDER — ORAL CARE MOUTH RINSE: 15.0000 mL | OROMUCOSAL | Status: DC | PRN

## 2023-10-10 MED ORDER — LACTATED RINGERS IV SOLN: INTRAVENOUS | Status: DC

## 2023-10-10 MED ORDER — BUDESONIDE 0.5 MG/2ML IN SUSP
0.5000 mg | Freq: Two times a day (BID) | RESPIRATORY_TRACT | Status: DC
  Administered 2023-10-10 – 2023-10-11 (×3): 0.5 mg via RESPIRATORY_TRACT
  Filled 2023-10-10 (×3): qty 2

## 2023-10-10 MED ORDER — SODIUM CHLORIDE 0.9 % IV SOLN: INTRAVENOUS | Status: AC | PRN

## 2023-10-10 MED ORDER — DORZOLAMIDE HCL-TIMOLOL MAL 2-0.5 % OP SOLN
1.0000 [drp] | Freq: Two times a day (BID) | OPHTHALMIC | Status: DC
  Administered 2023-10-10 – 2023-10-12 (×5): 1 [drp] via OPHTHALMIC
  Filled 2023-10-10 (×2): qty 10

## 2023-10-10 MED ORDER — BISACODYL 10 MG RE SUPP: 10.0000 mg | Freq: Every day | RECTAL | Status: DC | PRN

## 2023-10-10 MED ORDER — LATANOPROST 0.005 % OP SOLN
1.0000 [drp] | Freq: Every day | OPHTHALMIC | Status: DC
  Administered 2023-10-10 – 2023-10-11 (×2): 1 [drp] via OPHTHALMIC
  Filled 2023-10-10 (×2): qty 2.5

## 2023-10-10 MED ORDER — PANTOPRAZOLE SODIUM 40 MG PO TBEC
40.0000 mg | DELAYED_RELEASE_TABLET | Freq: Every day | ORAL | Status: DC
  Administered 2023-10-10 – 2023-10-12 (×3): 40 mg via ORAL
  Filled 2023-10-10 (×3): qty 1

## 2023-10-10 NOTE — ED Provider Notes (Signed)
  Physical Exam  BP (!) 102/49   Pulse (!) 58   Temp 97.6 F (36.4 C) (Oral)   Resp 10   Ht 5\' 2"  (1.575 m)   Wt 70.3 kg   SpO2 99%   BMI 28.35 kg/m   Physical Exam  Procedures  .Critical Care  Performed by: Marily Memos, MD Authorized by: Marily Memos, MD   Critical care provider statement:    Critical care time (minutes):  30   Critical care was necessary to treat or prevent imminent or life-threatening deterioration of the following conditions:  Shock and cardiac failure   Critical care was time spent personally by me on the following activities:  Development of treatment plan with patient or surrogate, discussions with consultants, evaluation of patient's response to treatment, examination of patient, ordering and review of laboratory studies, ordering and review of radiographic studies, ordering and performing treatments and interventions, pulse oximetry, re-evaluation of patient's condition and review of old charts   ED Course / MDM    Medical Decision Making Amount and/or Complexity of Data Reviewed Labs: ordered. Radiology: ordered. ECG/medicine tests: ordered.  Risk OTC drugs. Prescription drug management. Decision regarding hospitalization.  Here with chest pain radiating to back, AKI, blurry vision, hypotension, light headed. Pending second troponin and improved vitals.   Patient w/ h/o CHF, so slowly gave fluids through the night. BP's improved well with fluids. Had some episodes of soft pressures but on rechecks, MAP > 65. Second troponin slightly elevated over first. Ecg unchanged from previous and other recent in system. Heparin started after ct showed no dissection. Patient and daughter aware of the celiac/sma stenosis and aortic atherosclerosis. D/w Cardiology (Dr. Remus Loffler), felt that since patient had a reason for her troponins (hypotension) and improved symptoms with improved BP patient could stay here and cards consult in AM for completion of ACS workup.   After >1 hour of stable BP's, d/w Dr. Thomes Dinning for admission.       Aneudy Champlain, Barbara Cower, MD 10/10/23 [phone]

## 2023-10-10 NOTE — Progress Notes (Signed)
 PHARMACY - ANTICOAGULATION CONSULT NOTE  Pharmacy Consult for Heparin Indication: chest pain/ACS  Allergies  Allergen Reactions   Alphagan [Brimonidine] Itching   Diflunisal Swelling    Other reaction(s): ENTIRE BODY SWELLING   Vioxx [Rofecoxib] Shortness Of Breath   Metformin And Related     Kidney failure   Nexlizet [Bempedoic Acid-Ezetimibe]     Causes elevated Liver and Kidney function   Repatha [Evolocumab]     MYALGIAS   Codeine Rash   Elemental Sulfur Rash   Motrin [Ibuprofen] Rash   Penicillins Rash   Pravastatin Rash    Patient Measurements: Height: 5\' 2"  (157.5 cm) Weight: 70.3 kg (155 lb) IBW/kg (Calculated) : 50.1 HEPARIN DW (KG): 64.9  Vital Signs: Temp: 97.6 F (36.4 C) (04/06 2348) Temp Source: Oral (04/06 2348) BP: 88/40 (04/07 0030) Pulse Rate: 82 (04/07 0030)  Labs: Recent Labs    10/09/23 2152 10/09/23 2243  HGB 13.2  --   HCT 42.3  --   PLT 223  --   LABPROT  --  20.2*  INR  --  1.7*  CREATININE 1.55*  --   TROPONINIHS 99* 112*    Estimated Creatinine Clearance: 25.3 mL/min (A) (by C-G formula based on SCr of 1.55 mg/dL (H)).   Medical History: Past Medical History:  Diagnosis Date   Antral gastritis    EGD 11/15   Arthritis    Asthmatic bronchitis    Back pain    Breast cancer (HCC)    right breast   CAD in native artery 03/10/2021   Chronic diastolic heart failure (HCC) 05/20/2015   Grade 2 diastolic dysfunction.  04/2015.   Chronic kidney disease    kidney function low   COPD (chronic obstructive pulmonary disease) (HCC)    Diabetes mellitus    x 5 yrs   DVT of axillary vein, acute left (HCC) 07/24/2012   GERD (gastroesophageal reflux disease)    Glaucoma    POAG OU   Heart murmur    rheum fever at age 35   History of hiatal hernia    History of kidney stones    Hyperlipidemia 05/20/2015   Hypertension    Hypertensive retinopathy    OU   Hypothyroidism    Kidney stones    Macular degeneration    Wet OD, Dry  OS   Mixed hyperlipidemia    OSA (obstructive sleep apnea) 12/17/2021   does not use cpap on regular basis   Peripheral venous insufficiency    Pinched nerve    right elbow   Pneumonia    PONV (postoperative nausea and vomiting)    Sigmoid diverticulitis    Snoring 03/10/2021   Vertigo    chonic    Medications:  No current facility-administered medications on file prior to encounter.   Current Outpatient Medications on File Prior to Encounter  Medication Sig Dispense Refill   albuterol (PROVENTIL HFA;VENTOLIN HFA) 108 (90 BASE) MCG/ACT inhaler Inhale 2 puffs into the lungs every 4 (four) hours as needed for shortness of breath. 1 Inhaler 0   albuterol (PROVENTIL) (2.5 MG/3ML) 0.083% nebulizer solution Take 2.5 mg by nebulization every 6 (six) hours as needed for wheezing or shortness of breath.     Alirocumab (PRALUENT) 75 MG/ML SOAJ Inject 1 mL (75 mg total) into the skin every 14 (fourteen) days. (Patient not taking: Reported on 10/04/2023) 2 mL 2   amitriptyline (ELAVIL) 25 MG tablet Take 25 mg by mouth at bedtime.     anastrozole (ARIMIDEX)  1 MG tablet Take 1 tablet (1 mg total) by mouth at bedtime. 90 tablet 3   carvedilol (COREG) 6.25 MG tablet TAKE ONE TABLET BY MOUTH TWICE DAILY 180 tablet 2   cetirizine (ZYRTEC) 10 MG tablet Take 10 mg by mouth daily.     Cholecalciferol (VITAMIN D-3) 1000 units CAPS Take 1,000 Units by mouth daily.     diazepam (VALIUM) 2 MG tablet Take 2 mg by mouth 2 (two) times daily as needed for anxiety (prior to eye injections).     dorzolamide-timolol (COSOPT) 22.3-6.8 MG/ML ophthalmic solution INSTILL 1 DROP INTO RIGHT EYE TWICE A DAY (Patient taking differently: Place 1 drop into the right eye 2 (two) times daily.) 30 mL 2   esomeprazole (NEXIUM) 20 MG capsule Take 20 mg by mouth daily at 12 noon.     FARXIGA 5 MG TABS tablet Take 5 mg by mouth every morning.     fluticasone (FLOVENT HFA) 110 MCG/ACT inhaler Inhale 1 puff into the lungs 2 (two) times  daily. Rinse mouth with water after each use 1 each 0   furosemide (LASIX) 40 MG tablet TAKE 1 TABLET (40 MG TOTAL) BY MOUTH DAILY. MAY TAKE EXTRA DAILY AS NEEDED FOR SWELLING (Patient taking differently: Take 40 mg by mouth daily.) 180 tablet 0   gabapentin (NEURONTIN) 300 MG capsule Take 300 mg by mouth 3 (three) times daily as needed (pain).     glimepiride (AMARYL) 2 MG tablet Take 2 mg by mouth daily. (Patient not taking: Reported on 10/04/2023)  2   hydrALAZINE (APRESOLINE) 25 MG tablet TAKE 1 TABLET BY MOUTH TWO TIMES DAILY AT 8 AM AND 10 PM. 270 tablet 1   isosorbide mononitrate (IMDUR) 60 MG 24 hr tablet Take 1 tablet (60 mg total) by mouth daily. 90 tablet 1   Lancets (ONETOUCH DELICA PLUS LANCET30G) MISC at bedtime.     latanoprost (XALATAN) 0.005 % ophthalmic solution Place 1 drop into the left eye at bedtime.     meclizine (ANTIVERT) 25 MG tablet Take 25 mg by mouth 2 (two) times daily as needed for dizziness.     metoprolol tartrate (LOPRESSOR) 100 MG tablet Take 1 tablet (100 mg total) by mouth once for 1 dose. 1 tablet 0   Multiple Vitamins-Minerals (PRESERVISION AREDS 2 PO) Take 1 capsule by mouth in the morning and at bedtime.     NON FORMULARY CBD Gummy     olmesartan (BENICAR) 40 MG tablet Take 40 mg by mouth daily.     Omega-3 Fatty Acids (FISH OIL) 1200 MG CAPS Take 1,200 mg by mouth 2 (two) times daily.     ondansetron (ZOFRAN-ODT) 4 MG disintegrating tablet Take 1 tablet (4 mg total) by mouth every 8 (eight) hours as needed. (Patient not taking: Reported on 10/04/2023) 20 tablet 0   ONETOUCH ULTRA test strip 1 each by Other route at bedtime.     Polyethyl Glycol-Propyl Glycol (SYSTANE OP) Place 1 drop into the left eye daily as needed (dry eye).     polyethylene glycol (MIRALAX / GLYCOLAX) packet Take 17 g by mouth daily as needed for moderate constipation.     potassium chloride SA (KLOR-CON M) 20 MEQ tablet TAKE ONE-HALF TABLET BY  MOUTH DAILY (Patient taking differently: Take  10 mEq by mouth See admin instructions. Only takes when taking) 45 tablet 2   promethazine-dextromethorphan (PROMETHAZINE-DM) 6.25-15 MG/5ML syrup Take 5 mLs by mouth 4 (four) times daily as needed. (Patient not taking: Reported on 10/04/2023)  100 mL 0   tamsulosin (FLOMAX) 0.4 MG CAPS capsule Take 1 capsule (0.4 mg total) by mouth daily. (Patient not taking: Reported on 10/04/2023) 14 capsule 0   vitamin B-12 (CYANOCOBALAMIN) 100 MCG tablet Take 100 mcg by mouth daily.     warfarin (COUMADIN) 2 MG tablet TAKE 1 TABLET BY MOUTH EVERY DAY AT BEDTIME OR AS DIRECTED BY THE COUMADIN CLINIC 90 tablet 1     Assessment: 83 y.o. female with chest pain for heparin.  H/O DVT on Coumadin--INR 1.7 tonight  Goal of Therapy:  Heparin level 0.3-0.7 units/ml Monitor platelets by anticoagulation protocol: Yes   Plan:  Heparin 2000 units IV bolus, then start heparin 850 units/hr Check heparin level in 8 hours.   Eddie Candle 10/10/2023,12:52 AM

## 2023-10-10 NOTE — Progress Notes (Signed)
   10/10/23 1758  TOC Brief Assessment  Insurance and Status Reviewed  Patient has primary care physician Yes  Home environment has been reviewed From home  Prior level of function: Independent  Prior/Current Home Services No current home services  Social Drivers of Health Review SDOH reviewed no interventions necessary  Readmission risk has been reviewed Yes  Transition of care needs no transition of care needs at this time   Transition of Care Department [facility]) has reviewed patient and no other TOC needs have been identified at this time. We will continue to monitor patient advancement through interdisciplinary progression rounds. If new patient needs arise, please place a TOC consult.

## 2023-10-10 NOTE — ED Notes (Signed)
 ED TO INPATIENT HANDOFF REPORT  ED Nurse Name and Phone #: [phone]  S Name/Age/Gender [name] 83 y.o. female Room/Bed: APA16A/APA16A  Code Status   Code Status: Prior  Home/SNF/Other Home Patient oriented to: self, place, time, and situation Is this baseline? Yes   Triage Complete: Triage complete  Chief Complaint chest pain  Triage Note Pt stated that she has been having chest pain that started today. Pt also complaining of blurry vision and nausea   Allergies Allergies  Allergen Reactions   Alphagan [Brimonidine] Itching   Diflunisal Swelling    Other reaction(s): ENTIRE BODY SWELLING   Vioxx [Rofecoxib] Shortness Of Breath   Metformin And Related     Kidney failure   Nexlizet [Bempedoic Acid-Ezetimibe]     Causes elevated Liver and Kidney function   Repatha [Evolocumab]     MYALGIAS   Codeine Rash   Elemental Sulfur Rash   Motrin [Ibuprofen] Rash   Penicillins Rash   Pravastatin Rash    Level of Care/Admitting Diagnosis ED Disposition     ED Disposition  Admit   Condition  --   Comment  The patient appears reasonably stabilized for admission considering the current resources, flow, and capabilities available in the ED at this time, and I doubt any other [facility] requiring further screening and/or treatment in the ED prior to admission is  present.          B Medical/Surgery History Past Medical History:  Diagnosis Date   Antral gastritis    EGD 11/15   Arthritis    Asthmatic bronchitis    Back pain    Breast cancer (HCC)    right breast   CAD in native artery 03/10/2021   Chronic diastolic heart failure (HCC) 05/20/2015   Grade 2 diastolic dysfunction.  04/2015.   Chronic kidney disease    kidney function low   COPD (chronic obstructive pulmonary disease) (HCC)    Diabetes mellitus    x 5 yrs   DVT of axillary vein, acute left (HCC) 07/24/2012   GERD (gastroesophageal reflux disease)    Glaucoma    POAG OU   Heart murmur     rheum fever at age 49   History of hiatal hernia    History of kidney stones    Hyperlipidemia 05/20/2015   Hypertension    Hypertensive retinopathy    OU   Hypothyroidism    Kidney stones    Macular degeneration    Wet OD, Dry OS   Mixed hyperlipidemia    OSA (obstructive sleep apnea) 12/17/2021   does not use cpap on regular basis   Peripheral venous insufficiency    Pinched nerve    right elbow   Pneumonia    PONV (postoperative nausea and vomiting)    Sigmoid diverticulitis    Snoring 03/10/2021   Vertigo    chonic   Past Surgical History:  Procedure Laterality Date   ABDOMINAL HYSTERECTOMY     BACK SURGERY     spinal    BREAST LUMPECTOMY WITH RADIOACTIVE SEED LOCALIZATION Right 12/03/2020   Procedure: RIGHT BREAST LUMPECTOMY WITH RADIOACTIVE SEED LOCALIZATION;  Surgeon: Manus Rudd, MD;  Location: [facility] OR;  Service: General;  Laterality: Right;   CARDIAC CATHETERIZATION N/A 05/26/2015   Procedure: Left Heart Cath and Coronary Angiography;  Surgeon: Corky Crafts, MD;  Location: [facility] INVASIVE CV LAB;  Service: Cardiovascular;  Laterality: N/A;   CATARACT EXTRACTION Bilateral    CHOLECYSTECTOMY     COLON SURGERY  COLONOSCOPY N/A 12/27/2013   Procedure: COLONOSCOPY;  Surgeon: Malissa Hippo, MD;  Location: [facility] ENDO SUITE;  Service: Endoscopy;  Laterality: N/A;  200   COLOSTOMY CLOSURE     CYSTOSCOPY/URETEROSCOPY/HOLMIUM LASER/STENT PLACEMENT Right 11/24/2022   Procedure: CYSTOSCOPY RIGHT URETEROSCOPY/HOLMIUM LASER/STENT PLACEMENT;  Surgeon: Crista Elliot, MD;  Location: [facility] ORS;  Service: Urology;  Laterality: Right;  60 MINS FOR CASE   ENDARTERECTOMY Left 03/10/2022   Procedure: LEFT CAROTID ENDARTERECTOMY;  Surgeon: Leonie Douglas, MD;  Location: [facility] OR;  Service: Vascular;  Laterality: Left;   ESOPHAGOGASTRODUODENOSCOPY N/A 05/07/2014   Procedure: ESOPHAGOGASTRODUODENOSCOPY (EGD);  Surgeon: Malissa Hippo, MD;  Location: [facility] ENDO SUITE;  Service: Endoscopy;   Laterality: N/A;   EYE SURGERY Bilateral    Cat Sx   fracture left foot     HERNIA REPAIR     NM MYOCAR PERF WALL MOTION  01/28/2009   Normal   OTHER SURGICAL HISTORY     colostomy, colostomy reversal, for diverticulitis surgical hernia repair, arm surgery, neck surgery   PATCH ANGIOPLASTY Left 03/10/2022   Procedure: PATCH ANGIOPLASTY WITH 1X6CM Kathleen Lime;  Surgeon: Leonie Douglas, MD;  Location: [facility] OR;  Service: Vascular;  Laterality: Left;   US ECHOCARDIOGRAPHY  02/11/2010   Mild MR,trace TR & AI     A IV Location/Drains/Wounds Patient Lines/Drains/Airways Status     Active Line/Drains/Airways     Name Placement date Placement time Site Days   Peripheral IV 10/09/23 20 G Anterior;Right Forearm 10/09/23  2201  Forearm  1            Intake/Output Last 24 hours  Intake/Output Summary (Last 24 hours) at 10/10/2023 0624 Last data filed at 10/10/2023 0416 Gross per 24 hour  Intake 44.04 ml  Output --  Net 44.04 ml    Labs/Imaging Results for orders placed or performed during the hospital encounter of 10/09/23 (from the past 48 hours)  Basic metabolic panel     Status: Abnormal   Collection Time: 10/09/23  9:52 PM  Result Value Ref Range   Sodium 136 135 - 145 mmol/L   Potassium 3.9 3.5 - 5.1 mmol/L   Chloride 103 98 - 111 mmol/L   CO2 22 22 - 32 mmol/L   Glucose, Bld 145 (H) 70 - 99 mg/dL    Comment: Glucose reference range applies only to samples taken after fasting for at least 8 hours.   BUN 26 (H) 8 - 23 mg/dL   Creatinine, Ser 2.53 (H) 0.44 - 1.00 mg/dL   Calcium 9.5 8.9 - 66.4 mg/dL   GFR, Estimated 33 (L) >60 mL/min    Comment: (NOTE) Calculated using the CKD-EPI Creatinine Equation (2021)    Anion gap 11 5 - 15    Comment: Performed at [facility], [address], [place], [place] [address]  CBC     Status: None   Collection Time: 10/09/23  9:52 PM  Result Value Ref Range   WBC 7.9 4.0 - 10.5 K/uL   RBC 4.42 3.87 - 5.11 MIL/uL   Hemoglobin 13.2  12.0 - 15.0 g/dL   HCT 42.5 95.6 - 38.7 %   MCV 95.7 80.0 - 100.0 fL   MCH 29.9 26.0 - 34.0 pg   MCHC 31.2 30.0 - 36.0 g/dL   RDW 56.4 33.2 - 95.1 %   Platelets 223 150 - 400 K/uL   nRBC 0.0 0.0 - 0.2 %    Comment: Performed at [facility], [address], [place], [place] [address]  Troponin I (High Sensitivity)     Status: Abnormal   Collection Time: 10/09/23  9:52 PM  Result Value Ref Range   Troponin I (High Sensitivity) 99 (H) <18 ng/L    Comment: (NOTE) Elevated high sensitivity troponin I (hsTnI) values and significant  changes across serial measurements may suggest ACS but many other  chronic and acute conditions are known to elevate hsTnI results.  Refer to the "Links" section for chest pain algorithms and additional  guidance. Performed at [facility], [address] [place], [place] [address]   Protime-INR     Status: Abnormal   Collection Time: 10/09/23 10:43 PM  Result Value Ref Range   Prothrombin Time 20.2 (H) 11.4 - 15.2 seconds   INR 1.7 (H) 0.8 - 1.2    Comment: (NOTE) INR goal varies based on device and disease states. Performed at [facility], [address], [place], [place] [address]   Lipase, blood     Status: None   Collection Time: 10/09/23 10:43 PM  Result Value Ref Range   Lipase 43 11 - 51 U/L    Comment: Performed at [facility], [address], [place], [place] [address]  Hepatic function panel     Status: Abnormal   Collection Time: 10/09/23 10:43 PM  Result Value Ref Range   Total Protein 6.0 (L) 6.5 - 8.1 g/dL   Albumin 3.5 3.5 - 5.0 g/dL   AST 14 (L) 15 - 41 U/L   ALT 16 0 - 44 U/L   Alkaline Phosphatase 75 38 - 126 U/L   Total Bilirubin 0.4 0.0 - 1.2 mg/dL   Bilirubin, Direct 0.1 0.0 - 0.2 mg/dL   Indirect Bilirubin 0.3 0.3 - 0.9 mg/dL    Comment: Performed at [facility], [address], [place], [place] [address]  Troponin I (High Sensitivity)     Status: Abnormal   Collection Time: 10/09/23 10:43 PM  Result Value Ref Range    Troponin I (High Sensitivity) 112 (HH) <18 ng/L    Comment: CRITICAL RESULT CALLED TO, READ BACK BY AND VERIFIED WITH CRABTREE,B ON 10/10/23 AT 0018 BY PURDIE,J (NOTE) Elevated high sensitivity troponin I (hsTnI) values and significant  changes across serial measurements may suggest ACS but many other  chronic and acute conditions are known to elevate hsTnI results.  Refer to the "Links" section for chest pain algorithms and additional  guidance. Performed at [facility], [address], [place], [place] [address]    CT Angio Chest/Abd/Pel for Dissection W and/or Wo Contrast Result Date: 10/10/2023 CLINICAL DATA:  Chest pain blurry vision EXAM: CT ANGIOGRAPHY CHEST, ABDOMEN AND PELVIS TECHNIQUE: Non-contrast CT of the chest was initially obtained. Multidetector CT imaging through the chest, abdomen and pelvis was performed using the standard protocol during bolus administration of intravenous contrast. Multiplanar reconstructed images and MIPs were obtained and reviewed to evaluate the vascular anatomy. RADIATION DOSE REDUCTION: This exam was performed according to the departmental dose-optimization program which includes automated exposure control, adjustment of the mA and/or kV according to patient size and/or use of iterative reconstruction technique. CONTRAST:  80mL OMNIPAQUE IOHEXOL 350 MG/ML SOLN COMPARISON:  CT 06/07/2023, 11/15/2022 FINDINGS: CTA CHEST FINDINGS Cardiovascular: Non contrasted images of the chest demonstrate no acute intramural hematoma. Moderate severe aortic atherosclerosis. No aneurysm or dissection. Common origin of the right brachiocephalic artery and left common carotid artery. Mild stenosis of the left subclavian artery about a cm distal to the origin. Normal  cardiac size. No pericardial effusion Mediastinum/Nodes: Patent trachea. No thyroid mass. No suspicious lymph nodes. Esophagus within normal limits. Lungs/Pleura: No acute airspace disease, pleural effusion or  pneumothorax. Apical scarring. Musculoskeletal: Sternum is intact.  No acute osseous abnormality. Review of the MIP images confirms the above findings. CTA ABDOMEN AND PELVIS FINDINGS VASCULAR Aorta: Normal caliber aorta without aneurysm, dissection, vasculitis or significant stenosis. Moderate aortic atherosclerosis. 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 Distal vascular patency. No dissection or aneurysm SMA: Patent without evidence of aneurysm, dissection, vasculitis or significant stenosis. Renals: Heavily calcified at the origin. Possible mild stenosis at the right renal origin and moderate stenosis at the left renal artery origin. IMA: Suspect moderate severe stenosis at the origin of the IMA. No aneurysm. Distal patency. Inflow: Patent without evidence of aneurysm, dissection, vasculitis or significant stenosis. Veins: Suboptimally assessed Review of the MIP images confirms the above findings. NON-VASCULAR Hepatobiliary: No focal liver abnormality is seen. Status post cholecystectomy. No biliary dilatation. Pancreas: Unremarkable. No pancreatic ductal dilatation or surrounding inflammatory changes. Spleen: Normal in size without focal abnormality. Adrenals/Urinary Tract: Adrenal glands are within normal limits. Kidneys show no hydronephrosis. 3 mm nonobstructing stone lower pole right kidney. Bladder is unremarkable. Stomach/Bowel: Stomach nonenlarged. No dilated small bowel. No acute bowel wall thickening. Postsurgical changes at the sigmoid colon. Lymphatic: No suspicious lymph nodes Reproductive: Hysterectomy.  No adnexal mass Other: Negative for pelvic effusion or free air. Fat containing supraumbilical ventral hernia. Musculoskeletal: Degenerative changes.  No acute osseous abnormality Review of the MIP images confirms the above findings. IMPRESSION: 1. Negative for acute aortic dissection or aneurysm. 2. Moderate severe aortic atherosclerosis. Suspected moderate  severe stenosis at the origin of the celiac artery and IMA. Possible mild stenosis at the right renal origin and moderate stenosis at the left renal artery origin. 3. 3 mm nonobstructing stone lower pole right kidney. 4. Probable fat containing supraumbilical ventral hernia. 5. Aortic atherosclerosis. Electronically Signed   By: Jasmine Pang M.D.   On: 10/10/2023 00:04   DG Chest 2 View Result Date: 10/09/2023 CLINICAL DATA:  Chest pain. EXAM: CHEST - 2 VIEW COMPARISON:  Radiograph and CT 06/06/2023 FINDINGS: The cardiomediastinal contours are normal. Biapical pleuroparenchymal scarring. Pulmonary vasculature is normal. No consolidation, pleural effusion, or pneumothorax. No acute osseous abnormalities are seen. IMPRESSION: No active cardiopulmonary disease. Electronically Signed   By: Narda Rutherford M.D.   On: 10/09/2023 22:23    Pending Labs Unresulted Labs (From admission, onward)     Start     Ordered   10/11/23 0500  Heparin level (unfractionated)  Daily,   R      10/10/23 0054   10/11/23 0500  CBC  Daily,   R      10/10/23 0054   10/10/23 0800  Heparin level (unfractionated)  Once-Timed,   URGENT        10/10/23 0054            Vitals/Pain Today's Vitals   10/10/23 0500 10/10/23 0545 10/10/23 0600 10/10/23 0615  BP: (!) 123/56 (!) 104/52 (!) 95/43 (!) 97/47  Pulse: 71 74 (!) 56 (!) 57  Resp: 11 11 18 14   Temp:      TempSrc:      SpO2: 100% 100% 100% 100%  Weight:      Height:      PainSc:        Isolation Precautions No active isolations  Medications Medications  heparin ADULT infusion 100  units/mL (25000 units/273mL) (850 Units/hr Intravenous Infusion Verify 10/10/23 0416)  lactated ringers infusion ( Intravenous New Bag/Given 10/10/23 0411)  sodium chloride 0.9 % bolus 1,000 mL (0 mLs Intravenous Stopped 10/09/23 2327)  ondansetron (ZOFRAN) injection 4 mg (4 mg Intravenous Given 10/09/23 2238)  famotidine (PEPCID) tablet 20 mg (20 mg Oral Given 10/09/23 2308)  alum &  mag hydroxide-simeth (MAALOX/MYLANTA) 200-200-20 MG/5ML suspension 30 mL (30 mLs Oral Given 10/09/23 2308)  acetaminophen (TYLENOL) tablet 650 mg (650 mg Oral Given 10/09/23 2308)  iohexol (OMNIPAQUE) 350 MG/ML injection 80 mL (80 mLs Intravenous Contrast Given 10/09/23 2326)  lactated ringers bolus 1,000 mL (0 mLs Intravenous Stopped 10/10/23 0239)  heparin bolus via infusion 2,000 Units (2,000 Units Intravenous Bolus from Bag 10/10/23 0125)  lactated ringers bolus 1,000 mL (0 mLs Intravenous Stopped 10/10/23 0331)  lactated ringers bolus 1,000 mL (0 mLs Intravenous Stopped 10/10/23 0601)    Mobility walks with person assist     Focused Assessments Cardiac Assessment Handoff:    Lab Results  Component Value Date   CKTOTAL 100 01/08/2009   CKMB 3.3 01/08/2009   TROPONINI <0.30 05/04/2014   Lab Results  Component Value Date   DDIMER <0.27 05/03/2014   Does the Patient currently have chest pain? No    R Recommendations: See Admitting Provider Note  Report given to:   Additional Notes:  Pt ambulatory with one person assist

## 2023-10-10 NOTE — Progress Notes (Signed)
 HARMACY - ANTICOAGULATION CONSULT NOTE  Pharmacy Consult for Heparin Indication: chest pain/ACS  Allergies  Allergen Reactions   Alphagan [Brimonidine] Itching   Diflunisal Swelling    Other reaction(s): ENTIRE BODY SWELLING   Vioxx [Rofecoxib] Shortness Of Breath   Metformin And Related     Kidney failure   Nexlizet [Bempedoic Acid-Ezetimibe]     Causes elevated Liver and Kidney function   Repatha [Evolocumab]     MYALGIAS   Codeine Rash   Elemental Sulfur Rash   Motrin [Ibuprofen] Rash   Penicillins Rash   Pravastatin Rash    Patient Measurements: Height: 5\' 2"  (157.5 cm) Weight: 70.3 kg (155 lb) IBW/kg (Calculated) : 50.1 HEPARIN DW (KG): 64.9  Vital Signs: Temp: 98.6 F (37 C) (04/07 1600) Temp Source: Oral (04/07 1600) BP: 114/58 (04/07 1600) Pulse Rate: 66 (04/07 1600)  Labs: Recent Labs    10/09/23 2152 10/09/23 2243 10/10/23 0819 10/10/23 0952 10/10/23 1800  HGB 13.2  --   --   --   --   HCT 42.3  --   --   --   --   PLT 223  --   --   --   --   LABPROT  --  20.2* 22.5*  --   --   INR  --  1.7* 2.0*  --   --   HEPARINUNFRC  --   --  0.47  --  0.40  CREATININE 1.55*  --   --  1.11*  --   TROPONINIHS 99* 112*  --   --   --     Estimated Creatinine Clearance: 35.3 mL/min (A) (by C-G formula based on SCr of 1.11 mg/dL (H)).   Medical History: Past Medical History:  Diagnosis Date   Antral gastritis    EGD 11/15   Arthritis    Asthmatic bronchitis    Back pain    Breast cancer (HCC)    right breast   CAD in native artery 03/10/2021   Chronic diastolic heart failure (HCC) 05/20/2015   Grade 2 diastolic dysfunction.  04/2015.   Chronic kidney disease    kidney function low   COPD (chronic obstructive pulmonary disease) (HCC)    Diabetes mellitus    x 5 yrs   DVT of axillary vein, acute left (HCC) 07/24/2012   GERD (gastroesophageal reflux disease)    Glaucoma    POAG OU   Heart murmur    rheum fever at age 49   History of hiatal  hernia    History of kidney stones    Hyperlipidemia 05/20/2015   Hypertension    Hypertensive retinopathy    OU   Hypothyroidism    Kidney stones    Macular degeneration    Wet OD, Dry OS   Mixed hyperlipidemia    OSA (obstructive sleep apnea) 12/17/2021   does not use cpap on regular basis   Peripheral venous insufficiency    Pinched nerve    right elbow   Pneumonia    PONV (postoperative nausea and vomiting)    Sigmoid diverticulitis    Snoring 03/10/2021   Vertigo    chonic   Assessment: 83 y.o. female with chest pain for heparin. H/O DVT on Coumadin--INR 1.7 on admit.   Confirmatory heparin level at goal (0.40) on 850 units/hr. No bleeding issues noted. CBC within normal limits.   Goal of Therapy:  Heparin level 0.3-0.7 units/ml Monitor platelets by anticoagulation protocol: Yes   Plan:  Continue heparin at  850 units/hr Check heparin level in AM Monitor daily heparin levels while on heparin infusion Monitor CBC and signs/symptoms of bleeding Plan for possible cath 4/8  Thank you for involving pharmacy in this patient's care.   Rockwell Alexandria, PharmD Clinical Pharmacist 10/10/2023 7:02 PM

## 2023-10-10 NOTE — Progress Notes (Signed)
 HARMACY - ANTICOAGULATION CONSULT NOTE  Pharmacy Consult for Heparin Indication: chest pain/ACS  Allergies  Allergen Reactions   Alphagan [Brimonidine] Itching   Diflunisal Swelling    Other reaction(s): ENTIRE BODY SWELLING   Vioxx [Rofecoxib] Shortness Of Breath   Metformin And Related     Kidney failure   Nexlizet [Bempedoic Acid-Ezetimibe]     Causes elevated Liver and Kidney function   Repatha [Evolocumab]     MYALGIAS   Codeine Rash   Elemental Sulfur Rash   Motrin [Ibuprofen] Rash   Penicillins Rash   Pravastatin Rash    Patient Measurements: Height: 5\' 2"  (157.5 cm) Weight: 70.3 kg (155 lb) IBW/kg (Calculated) : 50.1 HEPARIN DW (KG): 64.9  Vital Signs: Temp: 97.7 F (36.5 C) (04/07 0820) Temp Source: Oral (04/07 0747) BP: 120/50 (04/07 0820) Pulse Rate: 59 (04/07 0820)  Labs: Recent Labs    10/09/23 2152 10/09/23 2243  HGB 13.2  --   HCT 42.3  --   PLT 223  --   LABPROT  --  20.2*  INR  --  1.7*  CREATININE 1.55*  --   TROPONINIHS 99* 112*    Estimated Creatinine Clearance: 25.3 mL/min (A) (by C-G formula based on SCr of 1.55 mg/dL (H)).   Medical History: Past Medical History:  Diagnosis Date   Antral gastritis    EGD 11/15   Arthritis    Asthmatic bronchitis    Back pain    Breast cancer (HCC)    right breast   CAD in native artery 03/10/2021   Chronic diastolic heart failure (HCC) 05/20/2015   Grade 2 diastolic dysfunction.  04/2015.   Chronic kidney disease    kidney function low   COPD (chronic obstructive pulmonary disease) (HCC)    Diabetes mellitus    x 5 yrs   DVT of axillary vein, acute left (HCC) 07/24/2012   GERD (gastroesophageal reflux disease)    Glaucoma    POAG OU   Heart murmur    rheum fever at age 17   History of hiatal hernia    History of kidney stones    Hyperlipidemia 05/20/2015   Hypertension    Hypertensive retinopathy    OU   Hypothyroidism    Kidney stones    Macular degeneration    Wet OD, Dry  OS   Mixed hyperlipidemia    OSA (obstructive sleep apnea) 12/17/2021   does not use cpap on regular basis   Peripheral venous insufficiency    Pinched nerve    right elbow   Pneumonia    PONV (postoperative nausea and vomiting)    Sigmoid diverticulitis    Snoring 03/10/2021   Vertigo    chonic   Assessment: 83 y.o. female with chest pain for heparin. H/O DVT on Coumadin--INR 1.7 on admit.   Initial heparin level at goal (0.47) on 850 units/hr. No bleeding issues noted. CBC within normal limits.   Goal of Therapy:  Heparin level 0.3-0.7 units/ml Monitor platelets by anticoagulation protocol: Yes   Plan:  Continue heparin at 850 units/hr Confirmatory heparin level this evening Recheck INR this am Plan for possible cath 4/8  Sheppard Coil PharmD., BCPS Clinical Pharmacist 10/10/2023 8:32 AM

## 2023-10-10 NOTE — Plan of Care (Signed)

## 2023-10-10 NOTE — H&P (Signed)
 History and Physical    Patient: [name] ZOX:[id number] DOB: 06/08/1941 DOA: 10/09/2023 DOS: the patient was seen and examined on 10/10/2023 PCP: [name], MD  Patient coming from: Home  Chief Complaint:  Chief Complaint  Patient presents with   Chest Pain   HPI: [name] is a 83 y.o. female with medical history significant for hx of  history of CAD, (50% LAD stenosis), chronic diastolic heart failure (grade 2), HLD, HTN, prior DVT on warfarin, breast cancer s/p lumpectomy and XRT, DM 2, GERD, carotid stenosis s/p left carotid endarterectomy in 03/2022, right bundle branch block , OSA, COPD presents to the ED with chest pains, chest pain radiated to the back Associated with nausea and some dizziness, dyspnea and visual disturbance -No headaches, no neck pain, no syncope, no leg pains, no leg swelling, no pleuritic symptoms   In ED--  -- Initial creatinine 1.55,  repeat creatinine down to 1.1 after hydration baseline usually 0.8-0.9 -A1c 7.0 -Potassium 4.0 sodium 141 , bicarb 26 ,anion gap 7 -INR 1.7 repeat INR 2.0 -- Chest x-ray without acute cardiopulmonary findings -CTA chest abdomen and pelvis negative for acute aortic dissection or aneurysm, moderate severe aortic stenosis noted, moderate severe stenosis at the origin of the celiac artery and IMA noted, mild stenosis at the right renal origin and moderate stenosis at the left renal artery origin noted -EKG sinus rhythm -Initial troponin 99 repeat troponin 112 -CBC unremarkable  -Patient was started on IV heparin in the ED  Review of Systems: As mentioned in the history of present illness. All other systems reviewed and are negative. Past Medical History:  Diagnosis Date   Antral gastritis    EGD 11/15   Arthritis    Asthmatic bronchitis    Back pain    Breast cancer (HCC)    right breast   CAD in native artery 03/10/2021   Chronic diastolic heart failure (HCC) 05/20/2015   Grade 2 diastolic dysfunction.   04/2015.   Chronic kidney disease    kidney function low   COPD (chronic obstructive pulmonary disease) (HCC)    Diabetes mellitus    x 5 yrs   DVT of axillary vein, acute left (HCC) 07/24/2012   GERD (gastroesophageal reflux disease)    Glaucoma    POAG OU   Heart murmur    rheum fever at age 62   History of hiatal hernia    History of kidney stones    Hyperlipidemia 05/20/2015   Hypertension    Hypertensive retinopathy    OU   Hypothyroidism    Kidney stones    Macular degeneration    Wet OD, Dry OS   Mixed hyperlipidemia    OSA (obstructive sleep apnea) 12/17/2021   does not use cpap on regular basis   Peripheral venous insufficiency    Pinched nerve    right elbow   Pneumonia    PONV (postoperative nausea and vomiting)    Sigmoid diverticulitis    Snoring 03/10/2021   Vertigo    chonic   Past Surgical History:  Procedure Laterality Date   ABDOMINAL HYSTERECTOMY     BACK SURGERY     spinal    BREAST LUMPECTOMY WITH RADIOACTIVE SEED LOCALIZATION Right 12/03/2020   Procedure: RIGHT BREAST LUMPECTOMY WITH RADIOACTIVE SEED LOCALIZATION;  Surgeon: Manus Rudd, MD;  Location: [facility] OR;  Service: General;  Laterality: Right;   CARDIAC CATHETERIZATION N/A 05/26/2015   Procedure: Left Heart Cath and Coronary Angiography;  Surgeon: Renelda Loma  Hoyle Barr, MD;  Location: [facility];  Service: Cardiovascular;  Laterality: N/A;   CATARACT EXTRACTION Bilateral    CHOLECYSTECTOMY     COLON SURGERY     COLONOSCOPY N/A 12/27/2013   Procedure: COLONOSCOPY;  Surgeon: Malissa Hippo, MD;  Location: [facility];  Service: Endoscopy;  Laterality: N/A;  200   COLOSTOMY CLOSURE     CYSTOSCOPY/URETEROSCOPY/HOLMIUM LASER/STENT PLACEMENT Right 11/24/2022   Procedure: CYSTOSCOPY RIGHT URETEROSCOPY/HOLMIUM LASER/STENT PLACEMENT;  Surgeon: Crista Elliot, MD;  Location: [facility]S;  Service: Urology;  Laterality: Right;  60 MINS FOR CASE   ENDARTERECTOMY Left 03/10/2022   Procedure: LEFT  CAROTID ENDARTERECTOMY;  Surgeon: Leonie Douglas, MD;  Location: [facility] OR;  Service: Vascular;  Laterality: Left;   ESOPHAGOGASTRODUODENOSCOPY N/A 05/07/2014   Procedure: ESOPHAGOGASTRODUODENOSCOPY (EGD);  Surgeon: Malissa Hippo, MD;  Location: [facility];  Service: Endoscopy;  Laterality: N/A;   EYE SURGERY Bilateral    Cat Sx   fracture left foot     HERNIA REPAIR     NM MYOCAR PERF WALL MOTION  01/28/2009   Normal   OTHER SURGICAL HISTORY     colostomy, colostomy reversal, for diverticulitis surgical hernia repair, arm surgery, neck surgery   PATCH ANGIOPLASTY Left 03/10/2022   Procedure: PATCH ANGIOPLASTY WITH 1X6CM Kathleen Lime;  Surgeon: Leonie Douglas, MD;  Location: [facility] OR;  Service: Vascular;  Laterality: Left;   US ECHOCARDIOGRAPHY  02/11/2010   Mild MR,trace TR & AI   Social History:  reports that she has quit smoking. Her smoking use included cigarettes. She has never been exposed to tobacco smoke. She has never used smokeless tobacco. She reports that she does not drink alcohol and does not use drugs.  Allergies  Allergen Reactions   Alphagan [Brimonidine] Itching   Diflunisal Swelling    Other reaction(s): ENTIRE BODY SWELLING   Vioxx [Rofecoxib] Shortness Of Breath   Metformin And Related     Kidney failure   Nexlizet [Bempedoic Acid-Ezetimibe]     Causes elevated Liver and Kidney function   Repatha [Evolocumab]     MYALGIAS   Codeine Rash   Elemental Sulfur Rash   Motrin [Ibuprofen] Rash   Penicillins Rash   Pravastatin Rash    Family History  Problem Relation Age of Onset   CVA Maternal Grandmother 70       deceased   Heart disease Maternal Grandmother    Stroke Maternal Grandmother    Breast cancer Maternal Grandmother    Other Mother 59       Cause unknown   Cancer Mother        liver   Heart attack Brother 57       deceased   Breast cancer Sister    Leukemia Maternal Aunt    Glaucoma Maternal Uncle    Bone cancer Maternal Uncle    Spina  bifida Daughter     Prior to Admission medications   Medication Sig Start Date End Date Taking? Authorizing Provider  albuterol (PROVENTIL HFA;VENTOLIN HFA) 108 (90 BASE) MCG/ACT inhaler Inhale 2 puffs into the lungs every 4 (four) hours as needed for shortness of breath. 06/04/15   Chilton Si, MD  albuterol (PROVENTIL) (2.5 MG/3ML) 0.083% nebulizer solution Take 2.5 mg by nebulization every 6 (six) hours as needed for wheezing or shortness of breath.    [provider]  Alirocumab (PRALUENT) 75 MG/ML SOAJ Inject 1 mL (75 mg total) into the skin every 14 (fourteen)  days. Patient not taking: Reported on 10/04/2023 03/09/23   Chilton Si, MD  amitriptyline (ELAVIL) 25 MG tablet Take 25 mg by mouth at bedtime.    [provider]  anastrozole (ARIMIDEX) 1 MG tablet Take 1 tablet (1 mg total) by mouth at bedtime. 04/13/23   [name], MD  carvedilol (COREG) 6.25 MG tablet TAKE ONE TABLET BY MOUTH TWICE DAILY 02/23/23   Chilton Si, MD  cetirizine (ZYRTEC) 10 MG tablet Take 10 mg by mouth daily.    [provider]  Cholecalciferol (VITAMIN D-3) 1000 units CAPS Take 1,000 Units by mouth daily.    [provider]  diazepam (VALIUM) 2 MG tablet Take 2 mg by mouth 2 (two) times daily as needed for anxiety (prior to eye injections). 04/10/20   [provider]  dorzolamide-timolol (COSOPT) 22.3-6.8 MG/ML ophthalmic solution INSTILL 1 DROP INTO RIGHT EYE TWICE A DAY Patient taking differently: Place 1 drop into the right eye 2 (two) times daily. 08/28/20   Rennis Chris, MD  esomeprazole (NEXIUM) 20 MG capsule Take 20 mg by mouth daily at 12 noon.    [provider]  FARXIGA 5 MG TABS tablet Take 5 mg by mouth every morning. 10/01/20   [provider]  fluticasone (FLOVENT HFA) 110 MCG/ACT inhaler Inhale 1 puff into the lungs 2 (two) times daily. Rinse mouth with water after each use 06/06/22   Particia Nearing, PA-C  furosemide  (LASIX) 40 MG tablet TAKE 1 TABLET (40 MG TOTAL) BY MOUTH DAILY. MAY TAKE EXTRA DAILY AS NEEDED FOR SWELLING Patient taking differently: Take 40 mg by mouth daily. 04/15/21   Chilton Si, MD  gabapentin (NEURONTIN) 300 MG capsule Take 300 mg by mouth 3 (three) times daily as needed (pain). 05/08/19   [provider]  glimepiride (AMARYL) 2 MG tablet Take 2 mg by mouth daily. Patient not taking: Reported on 10/04/2023 08/26/17   [provider]  hydrALAZINE (APRESOLINE) 25 MG tablet TAKE 1 TABLET BY MOUTH TWO TIMES DAILY AT 8 AM AND 10 PM. 06/17/23   Chilton Si, MD  isosorbide mononitrate (IMDUR) 60 MG 24 hr tablet Take 1 tablet (60 mg total) by mouth daily. 10/04/23   Reather Littler D, NP  Lancets [facility] DELICA PLUS LANCET30G) MISC at bedtime. 09/14/23   [provider]  latanoprost (XALATAN) 0.005 % ophthalmic solution Place 1 drop into the left eye at bedtime.    [provider]  meclizine (ANTIVERT) 25 MG tablet Take 25 mg by mouth 2 (two) times daily as needed for dizziness.    [provider]  metoprolol tartrate (LOPRESSOR) 100 MG tablet Take 1 tablet (100 mg total) by mouth once for 1 dose. 10/04/23 10/04/23  Reather Littler D, NP  Multiple Vitamins-Minerals (PRESERVISION AREDS 2 PO) Take 1 capsule by mouth in the morning and at bedtime.    [provider]  NON FORMULARY CBD Gummy    [provider]  olmesartan (BENICAR) 40 MG tablet Take 40 mg by mouth daily. 09/15/23   [provider]  Omega-3 Fatty Acids (FISH OIL) 1200 MG CAPS Take 1,200 mg by mouth 2 (two) times daily.    [provider]  ondansetron (ZOFRAN-ODT) 4 MG disintegrating tablet Take 1 tablet (4 mg total) by mouth every 8 (eight) hours as needed. Patient not taking: Reported on 10/04/2023 11/15/22   Jacalyn Lefevre, MD  [facility] ULTRA test strip 1 each by Other route at bedtime. 09/14/23   [provider]  Polyethyl Glycol-Propyl Glycol  (SYSTANE OP) Place 1 drop into the left eye daily as needed (dry eye).    [provider]  polyethylene glycol (MIRALAX / GLYCOLAX) packet Take 17 g by mouth daily as needed for moderate constipation.    [provider]  potassium chloride SA (KLOR-CON M) 20 MEQ tablet TAKE ONE-HALF TABLET BY  MOUTH DAILY Patient taking differently: Take 10 mEq by mouth See admin instructions. Only takes when taking 08/11/21   Chilton Si, MD  promethazine-dextromethorphan (PROMETHAZINE-DM) 6.25-15 MG/5ML syrup Take 5 mLs by mouth 4 (four) times daily as needed. Patient not taking: Reported on 10/04/2023 06/06/22   Particia Nearing, PA-C  tamsulosin (FLOMAX) 0.4 MG CAPS capsule Take 1 capsule (0.4 mg total) by mouth daily. Patient not taking: Reported on 10/04/2023 11/15/22   Jacalyn Lefevre, MD  vitamin B-12 (CYANOCOBALAMIN) 100 MCG tablet Take 100 mcg by mouth daily. 03/04/14   [provider]  warfarin (COUMADIN) 2 MG tablet TAKE 1 TABLET BY MOUTH EVERY DAY AT BEDTIME OR AS DIRECTED BY THE COUMADIN CLINIC 04/01/23   Chilton Si, MD    Physical Exam: Vitals:   10/10/23 0747 10/10/23 0820 10/10/23 1343 10/10/23 1600  BP:  (!) 120/50 (!) 120/50 (!) 114/58  Pulse:  (!) 59 63 66  Resp:  12 18 18   Temp: 97.6 F (36.4 C) 97.7 F (36.5 C) 98.2 F (36.8 C) 98.6 F (37 C)  TempSrc: Oral  Oral Oral  SpO2:  100% 95% 96%  Weight:      Height:        Physical Exam  Gen:- Awake Alert, in no acute distress  HEENT:- [place].AT, No sclera icterus Neck-Supple Neck,No JVD,.  Lungs-  CTAB , fair air movement bilaterally  CV- S1, S2 normal, RRR Abd-  +ve B.Sounds, Abd Soft, No tenderness,    Extremity/Skin:- No  edema,   good pedal pulses  Psych-affect is appropriate, oriented x3 Neuro-no new focal deficits, no tremors  Data Reviewed: -- Initial creatinine 1.55,  repeat creatinine down to 1.1 after hydration baseline usually 0.8-0.9 -A1c 7.0 -Potassium 4.0 sodium 141 , bicarb  26 ,anion gap 7 -INR 1.7 repeat INR 2.0 -- Chest x-ray without acute cardiopulmonary findings -CTA chest abdomen and pelvis negative for acute aortic dissection or aneurysm, moderate severe aortic stenosis noted, moderate severe stenosis at the origin of the celiac artery and IMA noted, mild stenosis at the right renal origin and moderate stenosis at the left renal artery origin noted -EKG sinus rhythm -Initial troponin 99 repeat troponin 112 -CBC unremarkable  Assessment and Plan: 1)NSTEMI---hx of  history of CAD, (50% LAD stenosis), --EKG sinus rhythm nonspecific inferior T wave changes -Initial troponin 99 repeat troponin 112 -Cardiology consult appreciated -Coumadin on hold -Echo requested -Continue aspirin and metoprolol -Continue Imdur -Reports intolerance to statins -Continue IV heparin for now patient may need LHC  2)AKI----acute kidney injury --- Initial creatinine 1.55,  repeat creatinine down to 1.1 after hydration baseline usually 0.8-0.9 -Hold Lasix, hold olmesartan, --renally adjust medications, avoid nephrotoxic agents / dehydration  / hypotension  3)HFpEF--not volume overloaded -Hold Farxiga, hold olmesartan, hold Lasix, -Repeat echo requested  4)DM2-A1C7.0 reflecting fair diabetic control PTA -Hold Farxiga and Amaryl Use Novolog/Humalog Sliding scale insulin with Accu-Cheks/Fingersticks as ordered   5)H/o DVT--- DVT diagnosed July 2014 -PTA was on Coumadin -Currently on IV heparin in case she needs LHC  6)H/o Breast cancer s/p lumpectomy and XRT--continue Arimidex  7)GERD--- continue Protonix   Advance Care Planning:  Code Status: Full Code   Consults: cardiology  Family Communication: none at bedside  Severity of Illness: The appropriate patient status for this patient is INPATIENT. Inpatient status is judged to be reasonable and necessary in order to provide the required intensity of service to ensure the patient's safety. The patient's presenting  symptoms, physical exam findings, and initial radiographic and laboratory data in the context of their chronic comorbidities is felt to place them at high risk for further clinical deterioration. Furthermore, it is not anticipated that the patient will be medically stable for discharge from the hospital within 2 midnights of admission.   * I certify that at the point of admission it is my clinical judgment that the patient will require inpatient hospital care spanning beyond 2 midnights from the point of admission due to high intensity of service, high risk for further deterioration and high frequency of surveillance required.*  Author: Shon Hale, MD 10/10/2023 6:42 PM  For on call review [email or web address].

## 2023-10-10 NOTE — Consult Note (Addendum)
 Cardiology Consultation   Patient ID: [name] MRN: [id number]; DOB: April 02, 1941  Admit date: 10/09/2023 Date of Consult: 10/10/2023  PCP:  [name], MD   [facility] Providers Cardiologist:  Chilton Si, MD        Patient Profile:   [name] is a 83 y.o. female with a hx of  history of CAD, (50% LAD stenosis), chronic diastolic heart failure (grade 2), hypertension, hyperlipidemia, prior DVT on warfarin, breast cancer s/p lumpectomy and XRT, DM 2, carotid stenosis s/p left carotid endarterectomy in 03/2022, right bundle [name] block who is being seen 10/10/2023 for the evaluation of chest pain and hypotension at the request of Dr. Mariea Clonts.  History of Present Illness:   [name] with above PMHx had Myoview in 04/2015 for chest pain indicated LVEF 70% with small defect of moderate severity in the mid anterior and apical anterior region.  Felt to be due to breast attenuation artifact.  Underwent LHC in 05/2015 with 50% LAD lesion.  There was concern for component of vasospasm and long-acting nitrates were added.MPI in 10/2019 indicated nuclear stress EF of 74%, no ST segment deviation noted during stress, was low risk and normal.  Patient saw Ms. West PA-C 10/04/2023 with chest pain associated with belching.and Coronary CTA.  Patient presented with chest pain radiating to her back, AKI, light headed, hypotension and blurred vision. She was given fluids through the night with improved BP. EKG unchanged, CT celiac/sma stenosis and aortic atherosclerosis. Troponins 99, 112. IV heparin started. Patient says it was severe and pushing on her lower chest radiating into her back-different from pain last week. Associated SOB and no relief from mylicon/tums. Patient not very active other than walking around her house, no exertional chest pain. Goes to [employer] daily for several hours. Says she's eating and drinking about 40 ounces water daily. BP in office last week 158/88.  Imdur increased 60 mg daily. Currently pain free.     Past Medical History:  Diagnosis Date   Antral gastritis    EGD 11/15   Arthritis    Asthmatic bronchitis    Back pain    Breast cancer (HCC)    right breast   CAD in native artery 03/10/2021   Chronic diastolic heart failure (HCC) 05/20/2015   Grade 2 diastolic dysfunction.  04/2015.   Chronic kidney disease    kidney function low   COPD (chronic obstructive pulmonary disease) (HCC)    Diabetes mellitus    x 5 yrs   DVT of axillary vein, acute left (HCC) 07/24/2012   GERD (gastroesophageal reflux disease)    Glaucoma    POAG OU   Heart murmur    rheum fever at age 71   History of hiatal hernia    History of kidney stones    Hyperlipidemia 05/20/2015   Hypertension    Hypertensive retinopathy    OU   Hypothyroidism    Kidney stones    Macular degeneration    Wet OD, Dry OS   Mixed hyperlipidemia    OSA (obstructive sleep apnea) 12/17/2021   does not use cpap on regular basis   Peripheral venous insufficiency    Pinched nerve    right elbow   Pneumonia    PONV (postoperative nausea and vomiting)    Sigmoid diverticulitis    Snoring 03/10/2021   Vertigo    chonic    Past Surgical History:  Procedure Laterality Date   ABDOMINAL HYSTERECTOMY  BACK SURGERY     spinal    BREAST LUMPECTOMY WITH RADIOACTIVE SEED LOCALIZATION Right 12/03/2020   Procedure: RIGHT BREAST LUMPECTOMY WITH RADIOACTIVE SEED LOCALIZATION;  Surgeon: Manus Rudd, MD;  Location: [facility] OR;  Service: General;  Laterality: Right;   CARDIAC CATHETERIZATION N/A 05/26/2015   Procedure: Left Heart Cath and Coronary Angiography;  Surgeon: Corky Crafts, MD;  Location: [facility] INVASIVE CV LAB;  Service: Cardiovascular;  Laterality: N/A;   CATARACT EXTRACTION Bilateral    CHOLECYSTECTOMY     COLON SURGERY     COLONOSCOPY N/A 12/27/2013   Procedure: COLONOSCOPY;  Surgeon: Malissa Hippo, MD;  Location: [facility] END[facility] SUITE;  Service: Endoscopy;   Laterality: N/A;  200   COLOSTOMY CLOSURE     CYSTOSCOPY/URETEROSCOPY/HOLMIUM LASER/STENT PLACEMENT Right 11/24/2022   Procedure: CYSTOSCOPY RIGHT URETEROSCOPY/HOLMIUM LASER/STENT PLACEMENT;  Surgeon: Crista Elliot, MD;  Location: [facility] ORS;  Service: Urology;  Laterality: Right;  60 MINS FOR CASE   ENDARTERECTOMY Left 03/10/2022   Procedure: LEFT CAROTID ENDARTERECTOMY;  Surgeon: Leonie Douglas, MD;  Location: [facility] OR;  Service: Vascular;  Laterality: Left;   ESOPHAGOGASTRODUODENOSCOPY N/A 05/07/2014   Procedure: ESOPHAGOGASTRODUODENOSCOPY (EGD);  Surgeon: Malissa Hippo, MD;  Location: [facility] SUITE;  Service: Endoscopy;  Laterality: N/A;   EYE SURGERY Bilateral    Cat Sx   fracture left foot     HERNIA REPAIR     NM MYOCAR PERF WALL MOTION  01/28/2009   Normal   OTHER SURGICAL HISTORY     colostomy, colostomy reversal, for diverticulitis surgical hernia repair, arm surgery, neck surgery   PATCH ANGIOPLASTY Left 03/10/2022   Procedure: PATCH ANGIOPLASTY WITH 1X6CM Kathleen Lime;  Surgeon: Leonie Douglas, MD;  Location: [facility] OR;  Service: Vascular;  Laterality: Left;   US ECHOCARDIOGRAPHY  02/11/2010   Mild MR,trace TR & AI     Home Medications:  Prior to Admission medications   Medication Sig Start Date End Date Taking? Authorizing Provider  albuterol (PROVENTIL HFA;VENTOLIN HFA) 108 (90 BASE) MCG/ACT inhaler Inhale 2 puffs into the lungs every 4 (four) hours as needed for shortness of breath. 06/04/15   Chilton Si, MD  albuterol (PROVENTIL) (2.5 MG/3ML) 0.083% nebulizer solution Take 2.5 mg by nebulization every 6 (six) hours as needed for wheezing or shortness of breath.    [provider]  Alirocumab (PRALUENT) 75 MG/ML SOAJ Inject 1 mL (75 mg total) into the skin every 14 (fourteen) days. Patient not taking: Reported on 10/04/2023 03/09/23   Chilton Si, MD  amitriptyline (ELAVIL) 25 MG tablet Take 25 mg by mouth at bedtime.    [provider]  anastrozole  (ARIMIDEX) 1 MG tablet Take 1 tablet (1 mg total) by mouth at bedtime. 04/13/23   [name], MD  carvedilol (COREG) 6.25 MG tablet TAKE ONE TABLET BY MOUTH TWICE DAILY 02/23/23   Chilton Si, MD  cetirizine (ZYRTEC) 10 MG tablet Take 10 mg by mouth daily.    [provider]  Cholecalciferol (VITAMIN D-3) 1000 units CAPS Take 1,000 Units by mouth daily.    [provider]  diazepam (VALIUM) 2 MG tablet Take 2 mg by mouth 2 (two) times daily as needed for anxiety (prior to eye injections). 04/10/20   [provider]  dorzolamide-timolol (COSOPT) 22.3-6.8 MG/ML ophthalmic solution INSTILL 1 DROP INTO RIGHT EYE TWICE A DAY Patient taking differently: Place 1 drop into the right eye 2 (two) times daily. 08/28/20   Rennis Chris, MD  esomeprazole (  NEXIUM) 20 MG capsule Take 20 mg by mouth daily at 12 noon.    [provider]  FARXIGA 5 MG TABS tablet Take 5 mg by mouth every morning. 10/01/20   [provider]  fluticasone (FLOVENT HFA) 110 MCG/ACT inhaler Inhale 1 puff into the lungs 2 (two) times daily. Rinse mouth with water after each use 06/06/22   Particia Nearing, PA-C  furosemide (LASIX) 40 MG tablet TAKE 1 TABLET (40 MG TOTAL) BY MOUTH DAILY. MAY TAKE EXTRA DAILY AS NEEDED FOR SWELLING Patient taking differently: Take 40 mg by mouth daily. 04/15/21   Chilton Si, MD  gabapentin (NEURONTIN) 300 MG capsule Take 300 mg by mouth 3 (three) times daily as needed (pain). 05/08/19   [provider]  glimepiride (AMARYL) 2 MG tablet Take 2 mg by mouth daily. Patient not taking: Reported on 10/04/2023 08/26/17   [provider]  hydrALAZINE (APRESOLINE) 25 MG tablet TAKE 1 TABLET BY MOUTH TWO TIMES DAILY AT 8 AM AND 10 PM. 06/17/23   Chilton Si, MD  isosorbide mononitrate (IMDUR) 60 MG 24 hr tablet Take 1 tablet (60 mg total) by mouth daily. 10/04/23   Reather Littler D, NP  Lancets [facility] DELICA PLUS LANCET30G) MISC at  bedtime. 09/14/23   [provider]  latanoprost (XALATAN) 0.005 % ophthalmic solution Place 1 drop into the left eye at bedtime.    [provider]  meclizine (ANTIVERT) 25 MG tablet Take 25 mg by mouth 2 (two) times daily as needed for dizziness.    [provider]  metoprolol tartrate (LOPRESSOR) 100 MG tablet Take 1 tablet (100 mg total) by mouth once for 1 dose. 10/04/23 10/04/23  Reather Littler D, NP  Multiple Vitamins-Minerals (PRESERVISION AREDS 2 PO) Take 1 capsule by mouth in the morning and at bedtime.    [provider]  NON FORMULARY CBD Gummy    [provider]  olmesartan (BENICAR) 40 MG tablet Take 40 mg by mouth daily. 09/15/23   [provider]  Omega-3 Fatty Acids (FISH OIL) 1200 MG CAPS Take 1,200 mg by mouth 2 (two) times daily.    [provider]  ondansetron (ZOFRAN-ODT) 4 MG disintegrating tablet Take 1 tablet (4 mg total) by mouth every 8 (eight) hours as needed. Patient not taking: Reported on 10/04/2023 11/15/22   Jacalyn Lefevre, MD  [facility] ULTRA test strip 1 each by Other route at bedtime. 09/14/23   [provider]  Polyethyl Glycol-Propyl Glycol (SYSTANE OP) Place 1 drop into the left eye daily as needed (dry eye).    [provider]  polyethylene glycol (MIRALAX / GLYCOLAX) packet Take 17 g by mouth daily as needed for moderate constipation.    [provider]  potassium chloride SA (KLOR-CON M) 20 MEQ tablet TAKE ONE-HALF TABLET BY  MOUTH DAILY Patient taking differently: Take 10 mEq by mouth See admin instructions. Only takes when taking 08/11/21   Chilton Si, MD  promethazine-dextromethorphan (PROMETHAZINE-DM) 6.25-15 MG/5ML syrup Take 5 mLs by mouth 4 (four) times daily as needed. Patient not taking: Reported on 10/04/2023 06/06/22   Particia Nearing, PA-C  tamsulosin (FLOMAX) 0.4 MG CAPS capsule Take 1 capsule (0.4 mg total) by mouth daily. Patient not taking: Reported on  10/04/2023 11/15/22   Jacalyn Lefevre, MD  vitamin B-12 (CYANOCOBALAMIN) 100 MCG tablet Take 100 mcg by mouth daily. 03/04/14   [provider]  warfarin (COUMADIN) 2 MG tablet TAKE 1 TABLET BY MOUTH EVERY DAY AT  BEDTIME OR AS DIRECTED BY THE COUMADIN CLINIC 04/01/23   Chilton Si, MD    Inpatient Medications: Scheduled Meds:  Continuous Infusions:  heparin 850 Units/hr (10/10/23 0416)   lactated ringers 125 mL/hr at 10/10/23 0411   PRN Meds: mouth rinse  Allergies:    Allergies  Allergen Reactions   Alphagan [Brimonidine] Itching   Diflunisal Swelling    Other reaction(s): ENTIRE BODY SWELLING   Vioxx [Rofecoxib] Shortness Of Breath   Metformin And Related     Kidney failure   Nexlizet [Bempedoic Acid-Ezetimibe]     Causes elevated Liver and Kidney function   Repatha [Evolocumab]     MYALGIAS   Codeine Rash   Elemental Sulfur Rash   Motrin [Ibuprofen] Rash   Penicillins Rash   Pravastatin Rash    Social History:   Social History   Socioeconomic History   Marital status: Widowed    Spouse name: Not on file   Number of children: Not on file   Years of education: Not on file   Highest education level: Not on file  Occupational History   Not on file  Tobacco Use   Smoking status: Former    Types: Cigarettes    Passive exposure: Never   Smokeless tobacco: Never   Tobacco comments:    Smoke 1-1 1/2 packs a day  Vaping Use   Vaping status: Never Used  Substance and Sexual Activity   Alcohol use: No   Drug use: No   Sexual activity: Not Currently    Birth control/protection: Surgical    Comment: partial hysterectomy  Other Topics Concern   Not on file  Social History Narrative   Not on file   Social Drivers of Health   Financial Resource Strain: Low Risk  (09/17/2021)   Overall Financial Resource Strain (CARDIA)    Difficulty of Paying Living Expenses: Not very hard  Food Insecurity: No Food Insecurity (10/10/2023)   Hunger Vital Sign     Worried About Running Out of Food in the Last Year: Never true    Ran Out of Food in the Last Year: Never true  Transportation Needs: No Transportation Needs (10/10/2023)   PRAPARE - Administrator, Civil Service (Medical): No    Lack of Transportation (Non-Medical): No  Physical Activity: Inactive (09/17/2021)   Exercise Vital Sign    Days of Exercise per Week: 0 days    Minutes of Exercise per Session: 0 min  Stress: No Stress Concern Present (09/17/2021)   [employer] of Occupational Health - Occupational Stress Questionnaire    Feeling of Stress : Not at all  Social Connections: Moderately Integrated (10/10/2023)   Social Connection and Isolation Panel [NHANES]    Frequency of Communication with Friends and Family: More than three times a week    Frequency of Social Gatherings with Friends and Family: More than three times a week    Attends Religious Services: More than 4 times per year    Active Member of [name] or Organizations: Yes    Attends Banker Meetings: More than 4 times per year    Marital Status: Widowed  Intimate Partner Violence: Unknown (10/10/2023)   Humiliation, Afraid, Rape, and Kick questionnaire    Fear of Current or Ex-Partner: No    Emotionally Abused: No    Physically Abused: No    Sexually Abused: Not on file    Family History:     Family History  Problem Relation Age of Onset  CVA Maternal Grandmother 65       deceased   Heart disease Maternal Grandmother    Stroke Maternal Grandmother    Breast cancer Maternal Grandmother    Other Mother 66       Cause unknown   Cancer Mother        liver   Heart attack Brother 4       deceased   Breast cancer Sister    Leukemia Maternal Aunt    Glaucoma Maternal Uncle    Bone cancer Maternal Uncle    Spina bifida Daughter      ROS:  Please see the history of present illness.  Review of Systems  Cardiovascular:  Positive for chest pain and leg swelling.  Gastrointestinal:   Positive for flatus and heartburn.    All other ROS reviewed and negative.     Physical Exam/Data:   Vitals:   10/10/23 0630 10/10/23 0700 10/10/23 0747 10/10/23 0820  BP: (!) 102/49 (!) 96/50  (!) 120/50  Pulse: (!) 58 (!) 54  (!) 59  Resp: 10 10  12   Temp:   97.6 F (36.4 C) 97.7 F (36.5 C)  TempSrc:   Oral   SpO2: 99% 100%  100%  Weight:      Height:        Intake/Output Summary (Last 24 hours) at 10/10/2023 0838 Last data filed at 10/10/2023 0416 Gross per 24 hour  Intake 44.04 ml  Output --  Net 44.04 ml      10/09/2023    9:49 PM 10/04/2023    1:09 PM 06/06/2023    3:53 PM  Last 3 Weights  Weight (lbs) 155 lb 157 lb 12.8 oz 162 lb  Weight (kg) 70.308 kg 71.578 kg 73.483 kg     Body mass index is 28.35 kg/m.  General:  Obese, in no acute distress  HEENT: normal Neck: no JVD Vascular: right carotid bruits; Distal pulses 2+ bilaterally Cardiac:  normal S1, S2; RRR; no murmur   Lungs:  clear to auscultation bilaterally, no wheezing, rhonchi or rales  Abd: soft, nontender, no hepatomegaly  Ext: no edema Musculoskeletal:  No deformities, BUE and BLE strength normal and equal Skin: warm and dry  Neuro:  CNs 2-12 intact, no focal abnormalities noted Psych:  Normal affect   EKG:  The EKG was personally reviewed and demonstrates:  NSR with RBBB, LPFB Telemetry:  Telemetry was personally reviewed and demonstrates:  NSR  Relevant CV Studies:    Laboratory Data:  High Sensitivity Troponin:   Recent Labs  Lab 10/09/23 2152 10/09/23 2243  TROPONINIHS 99* 112*     Chemistry Recent Labs  Lab 10/04/23 1415 10/09/23 2152  NA 144 136  K 4.2 3.9  CL 106 103  CO2 23 22  GLUCOSE 84 145*  BUN 16 26*  CREATININE 0.88 1.55*  CALCIUM 9.9 9.5  GFRNONAA  --  33*  ANIONGAP  --  11    Recent Labs  Lab 10/09/23 2243  PROT 6.0*  ALBUMIN 3.5  AST 14*  ALT 16  ALKPHOS 75  BILITOT 0.4   Lipids No results for input(s): "CHOL", "TRIG", "HDL", "LABVLDL", "LDLCALC",  "CHOLHDL" in the last 168 hours.  Hematology Recent Labs  Lab 10/09/23 2152  WBC 7.9  RBC 4.42  HGB 13.2  HCT 42.3  MCV 95.7  MCH 29.9  MCHC 31.2  RDW 12.7  PLT 223   Thyroid No results for input(s): "TSH", "FREET4" in the last 168 hours.  BNPNo results for input(s): "BNP", "PROBNP" in the last 168 hours.  DDimer No results for input(s): "DDIMER" in the last 168 hours.   Radiology/Studies:  CT Angio Chest/Abd/Pel for Dissection W and/or Wo Contrast Result Date: 10/10/2023 CLINICAL DATA:  Chest pain blurry vision EXAM: CT ANGIOGRAPHY CHEST, ABDOMEN AND PELVIS TECHNIQUE: Non-contrast CT of the chest was initially obtained. Multidetector CT imaging through the chest, abdomen and pelvis was performed using the standard protocol during bolus administration of intravenous contrast. Multiplanar reconstructed images and MIPs were obtained and reviewed to evaluate the vascular anatomy. RADIATION DOSE REDUCTION: This exam was performed according to the departmental dose-optimization program which includes automated exposure control, adjustment of the mA and/or kV according to patient size and/or use of iterative reconstruction technique. CONTRAST:  80mL OMNIPAQUE IOHEXOL 350 MG/ML SOLN COMPARISON:  CT 06/07/2023, 11/15/2022 FINDINGS: CTA CHEST FINDINGS Cardiovascular: Non contrasted images of the chest demonstrate no acute intramural hematoma. Moderate severe aortic atherosclerosis. No aneurysm or dissection. Common origin of the right brachiocephalic artery and left common carotid artery. Mild stenosis of the left subclavian artery about a cm distal to the origin. Normal cardiac size. No pericardial effusion Mediastinum/Nodes: Patent trachea. No thyroid mass. No suspicious lymph nodes. Esophagus within normal limits. Lungs/Pleura: No acute airspace disease, pleural effusion or pneumothorax. Apical scarring. Musculoskeletal: Sternum is intact.  No acute osseous abnormality. Review of the MIP images  confirms the above findings. CTA ABDOMEN AND PELVIS FINDINGS VASCULAR Aorta: Normal caliber aorta without aneurysm, dissection, vasculitis or significant stenosis. Moderate aortic atherosclerosis. 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 Distal vascular patency. No dissection or aneurysm SMA: Patent without evidence of aneurysm, dissection, vasculitis or significant stenosis. Renals: Heavily calcified at the origin. Possible mild stenosis at the right renal origin and moderate stenosis at the left renal artery origin. IMA: Suspect moderate severe stenosis at the origin of the IMA. No aneurysm. Distal patency. Inflow: Patent without evidence of aneurysm, dissection, vasculitis or significant stenosis. Veins: Suboptimally assessed Review of the MIP images confirms the above findings. NON-VASCULAR Hepatobiliary: No focal liver abnormality is seen. Status post cholecystectomy. No biliary dilatation. Pancreas: Unremarkable. No pancreatic ductal dilatation or surrounding inflammatory changes. Spleen: Normal in size without focal abnormality. Adrenals/Urinary Tract: Adrenal glands are within normal limits. Kidneys show no hydronephrosis. 3 mm nonobstructing stone lower pole right kidney. Bladder is unremarkable. Stomach/Bowel: Stomach nonenlarged. No dilated small bowel. No acute bowel wall thickening. Postsurgical changes at the sigmoid colon. Lymphatic: No suspicious lymph nodes Reproductive: Hysterectomy.  No adnexal mass Other: Negative for pelvic effusion or free air. Fat containing supraumbilical ventral hernia. Musculoskeletal: Degenerative changes.  No acute osseous abnormality Review of the MIP images confirms the above findings. IMPRESSION: 1. Negative for acute aortic dissection or aneurysm. 2. Moderate severe aortic atherosclerosis. Suspected moderate severe stenosis at the origin of the celiac artery and IMA. Possible mild stenosis at the right renal origin and moderate  stenosis at the left renal artery origin. 3. 3 mm nonobstructing stone lower pole right kidney. 4. Probable fat containing supraumbilical ventral hernia. 5. Aortic atherosclerosis. Electronically Signed   By: Jasmine Pang M.D.   On: 10/10/2023 00:04   DG Chest 2 View Result Date: 10/09/2023 CLINICAL DATA:  Chest pain. EXAM: CHEST - 2 VIEW COMPARISON:  Radiograph and CT 06/06/2023 FINDINGS: The cardiomediastinal contours are normal. Biapical pleuroparenchymal scarring. Pulmonary vasculature is normal. No consolidation, pleural effusion, or pneumothorax. No acute osseous abnormalities are seen. IMPRESSION: No active cardiopulmonary  disease. Electronically Signed   By: Narda Rutherford M.D.   On: 10/09/2023 22:23     Assessment and Plan:   Chest pain, trop 99, 112, worrisome for angina. Pain free now on IV heparin. Will check BMET and INR today and plan for cath tomorrow if stable.  I have reviewed the risks, indications, and alternatives to angioplasty and stenting with the patient. Risks include but are not limited to bleeding, infection, vascular injury, stroke, myocardial infection, arrhythmia, kidney injury, radiation-related injury in the case of prolonged fluoroscopy use, emergency cardiac surgery, and death. The patient understands the risks of serious complication is low (<1%) and patient agrees to proceed.    CAD 50% LAD cath 2016, normal NST 2021, ? Vasospasm-see above  Diastolic CHF-currently dry-eats at [employer] daily  Hypotension-BP up in office last week and Imdur increased 60 mg daily-could be contributing to low BP-coreg, lasix and farxiga on hold  AKI-Crt 0.88 last week up to 1.55  HLD on Praluent  Carotid stenosis s/p left caroti endarterectomy 2023  OSA on CPAP  History of DVT on Coumadin     Risk Assessment/Risk Scores:     TIMI Risk Score for Unstable Angina or Non-ST Elevation MI:   The patient's TIMI risk score is 5, which indicates a 26% risk of all cause  mortality, new or recurrent myocardial infarction or need for urgent revascularization in the next 14 days.          For questions or updates, please contact [facility] Please consult [email or web address] for contact info under    Signed, Jacolyn Reedy, PA-C  10/10/2023 8:38 AM  Attending Note  Patient seen and discussed with PA Geni Bers, I agree with her documentation. 83 yo female history of nonobstructive CAD with 50% LAD disease by prior cath in 2016, notes indication prior suspicion for coronary vasospasm on nitrates, chronic HFpEF, HTN, HLD, prior DVT on coumadin, DM2, carotid stenosis with prior left CEA, presents with chest pain. Reoccuring episodes over the last few weeks.  Has been seen in clinic 10/04/23 with reported episodes of chest pain. Plans were for outpatient coronary CTA. She presented to ER yesterday night with reoccuring chest pain. 10/10 epigastric/midchest with associated nausea, lasted about 30 minutes.    Cr 1.55 BUN 26 WBC 7.9 Hgb 13.2 Plt 223 INR 1.7 Lipase 43  Trop 99-->112 EKG SR, RBBB, LPFB, inferior TWIs   1.NSTEMI - prior history of nonobstructive CAD, cath 2016 with 50% LAD disease. Also has been managed for vasospasm related pains as outpatient on imdur.  - seen in clinic for episodes of chest pain, plan was for outpatient coronary. Presented to ER with recurrent chest pain.  -Trop 99-->112 -EKG SR, RBBB, LPFB, inferior TWIs  - on coumadin for prior DVT, INR 1.7 on admission and 2 today. AKI on admission as well Cr up to 1.5. Hold on cath for today but would plan for tomorrow pending INR and renal function.  - medical therapy with hep gtt, imdur 60mg , lopressor 12.5mg  bid, - listed allergy to iburprofen with diffuse swelling. She does not recall as specific aspirin allergy. During 04/2014 admission with chest pain she received several days of aspirin without issue, also received prior to her heart cath in 2016. Will start ASA 81mg  daily in setting of  NSTEMI.   2. Chronic HFpEF - presented with signs of hypovolemia and AKI - diuretic held, given IVFs   3. AKI - admit Cr 1.55, baseline around 0.9 - given IVFs, repeat  labs pending this AM.   4. History of DVT on coumadin - from notes diagnosed Jan 2014, unprovoked has been on long term coumadin - coumadin stopped, on heparin at this time.   5. HLD - listed allergy to pravastatin due to elevated LFTs, repatha, nexlizet - primary cardiologist note mentions difficultly affording praluent. Seen by pharm D 01/2023  to consider inclisiran but similar high costs, reconsider if [employer] grant reenrolling.  - f/u lipid panel, may touch base again with PharmD [place] to see if any changes in options.    Dina Rich MD

## 2023-10-10 NOTE — ED Notes (Addendum)
 ED TO INPATIENT HANDOFF REPORT  ED Nurse Name and Phone #: Haze Justin [phone]  S Name/Age/Gender [name] 83 y.o. female Room/Bed: APA16A/APA16A  Code Status   Code Status: Prior  Home/SNF/Other Home Patient oriented to: self, place, time, and situation Is this baseline? Yes   Triage Complete: Triage complete  Chief Complaint Chest pain [R07.9]  Triage Note Pt stated that she has been having chest pain that started today. Pt also complaining of blurry vision and nausea   Allergies Allergies  Allergen Reactions   Alphagan [Brimonidine] Itching   Diflunisal Swelling    Other reaction(s): ENTIRE BODY SWELLING   Vioxx [Rofecoxib] Shortness Of Breath   Metformin And Related     Kidney failure   Nexlizet [Bempedoic Acid-Ezetimibe]     Causes elevated Liver and Kidney function   Repatha [Evolocumab]     MYALGIAS   Codeine Rash   Elemental Sulfur Rash   Motrin [Ibuprofen] Rash   Penicillins Rash   Pravastatin Rash    Level of Care/Admitting Diagnosis ED Disposition     ED Disposition  Admit   Condition  --   Comment  Hospital Area: [facility] [[id number]]  Level of Care: Telemetry [5]  Covid Evaluation: Asymptomatic - no recent exposure (last 10 days) testing not required  Diagnosis: Chest pain [744799]  Admitting Physician: Frankey Shown [[id number]]  Attending Physician: Frankey Shown [[id number]]          B Medical/Surgery History Past Medical History:  Diagnosis Date   Antral gastritis    EGD 11/15   Arthritis    Asthmatic bronchitis    Back pain    Breast cancer (HCC)    right breast   CAD in native artery 03/10/2021   Chronic diastolic heart failure (HCC) 05/20/2015   Grade 2 diastolic dysfunction.  04/2015.   Chronic kidney disease    kidney function low   COPD (chronic obstructive pulmonary disease) (HCC)    Diabetes mellitus    x 5 yrs   DVT of axillary vein, acute left (HCC) 07/24/2012   GERD (gastroesophageal  reflux disease)    Glaucoma    POAG OU   Heart murmur    rheum fever at age 46   History of hiatal hernia    History of kidney stones    Hyperlipidemia 05/20/2015   Hypertension    Hypertensive retinopathy    OU   Hypothyroidism    Kidney stones    Macular degeneration    Wet OD, Dry OS   Mixed hyperlipidemia    OSA (obstructive sleep apnea) 12/17/2021   does not use cpap on regular basis   Peripheral venous insufficiency    Pinched nerve    right elbow   Pneumonia    PONV (postoperative nausea and vomiting)    Sigmoid diverticulitis    Snoring 03/10/2021   Vertigo    chonic   Past Surgical History:  Procedure Laterality Date   ABDOMINAL HYSTERECTOMY     BACK SURGERY     spinal    BREAST LUMPECTOMY WITH RADIOACTIVE SEED LOCALIZATION Right 12/03/2020   Procedure: RIGHT BREAST LUMPECTOMY WITH RADIOACTIVE SEED LOCALIZATION;  Surgeon: Manus Rudd, MD;  Location: [facility] OR;  Service: General;  Laterality: Right;   CARDIAC CATHETERIZATION N/A 05/26/2015   Procedure: Left Heart Cath and Coronary Angiography;  Surgeon: Corky Crafts, MD;  Location: [facility] INVASIVE CV LAB;  Service: Cardiovascular;  Laterality: N/A;   CATARACT EXTRACTION Bilateral    CHOLECYSTECTOMY  COLON SURGERY     COLONOSCOPY N/A 12/27/2013   Procedure: COLONOSCOPY;  Surgeon: Malissa Hippo, MD;  Location: [facility] ENDO SUITE;  Service: Endoscopy;  Laterality: N/A;  200   COLOSTOMY CLOSURE     CYSTOSCOPY/URETEROSCOPY/HOLMIUM LASER/STENT PLACEMENT Right 11/24/2022   Procedure: CYSTOSCOPY RIGHT URETEROSCOPY/HOLMIUM LASER/STENT PLACEMENT;  Surgeon: Crista Elliot, MD;  Location: [facility] ORS;  Service: Urology;  Laterality: Right;  60 MINS FOR CASE   ENDARTERECTOMY Left 03/10/2022   Procedure: LEFT CAROTID ENDARTERECTOMY;  Surgeon: Leonie Anastasha Ortez, MD;  Location: [facility] OR;  Service: Vascular;  Laterality: Left;   ESOPHAGOGASTRODUODENOSCOPY N/A 05/07/2014   Procedure: ESOPHAGOGASTRODUODENOSCOPY (EGD);  Surgeon: Malissa Hippo, MD;  Location: [facility];  Service: Endoscopy;  Laterality: N/A;   EYE SURGERY Bilateral    Cat Sx   fracture left foot     HERNIA REPAIR     NM MYOCAR PERF WALL MOTION  01/28/2009   Normal   OTHER SURGICAL HISTORY     colostomy, colostomy reversal, for diverticulitis surgical hernia repair, arm surgery, neck surgery   PATCH ANGIOPLASTY Left 03/10/2022   Procedure: PATCH ANGIOPLASTY WITH 1X6CM Kathleen Lime;  Surgeon: Leonie Marvella Jenning, MD;  Location: [facility] OR;  Service: Vascular;  Laterality: Left;   US ECHOCARDIOGRAPHY  02/11/2010   Mild MR,trace TR & AI     A IV Location/Drains/Wounds Patient Lines/Drains/Airways Status     Active Line/Drains/Airways     Name Placement date Placement time Site Days   Peripheral IV 10/09/23 20 G Anterior;Right Forearm 10/09/23  2201  Forearm  1            Intake/Output Last 24 hours  Intake/Output Summary (Last 24 hours) at 10/10/2023 0736 Last data filed at 10/10/2023 0416 Gross per 24 hour  Intake 44.04 ml  Output --  Net 44.04 ml    Labs/Imaging Results for orders placed or performed during the hospital encounter of 10/09/23 (from the past 48 hours)  Basic metabolic panel     Status: Abnormal   Collection Time: 10/09/23  9:52 PM  Result Value Ref Range   Sodium 136 135 - 145 mmol/L   Potassium 3.9 3.5 - 5.1 mmol/L   Chloride 103 98 - 111 mmol/L   CO2 22 22 - 32 mmol/L   Glucose, Bld 145 (H) 70 - 99 mg/dL    Comment: Glucose reference range applies only to samples taken after fasting for at least 8 hours.   BUN 26 (H) 8 - 23 mg/dL   Creatinine, Ser 8.29 (H) 0.44 - 1.00 mg/dL   Calcium 9.5 8.9 - 56.2 mg/dL   GFR, Estimated 33 (L) >60 mL/min    Comment: (NOTE) Calculated using the CKD-EPI Creatinine Equation (2021)    Anion gap 11 5 - 15    Comment: Performed at [facility], [address] [place], [place] [address]  CBC     Status: None   Collection Time: 10/09/23  9:52 PM  Result Value Ref Range   WBC 7.9 4.0 -  10.5 K/uL   RBC 4.42 3.87 - 5.11 MIL/uL   Hemoglobin 13.2 12.0 - 15.0 g/dL   HCT 57.8 46.9 - 62.9 %   MCV 95.7 80.0 - 100.0 fL   MCH 29.9 26.0 - 34.0 pg   MCHC 31.2 30.0 - 36.0 g/dL   RDW 52.8 41.3 - 24.4 %   Platelets 223 150 - 400 K/uL   nRBC 0.0 0.0 - 0.2 %    Comment:  Performed at [facility], [address], [place], [place] [address]  Troponin I (High Sensitivity)     Status: Abnormal   Collection Time: 10/09/23  9:52 PM  Result Value Ref Range   Troponin I (High Sensitivity) 99 (H) <18 ng/L    Comment: (NOTE) Elevated high sensitivity troponin I (hsTnI) values and significant  changes across serial measurements may suggest ACS but many other  chronic and acute conditions are known to elevate hsTnI results.  Refer to the "Links" section for chest pain algorithms and additional  guidance. Performed at [facility], [address] [place], [place] [address]   Protime-INR     Status: Abnormal   Collection Time: 10/09/23 10:43 PM  Result Value Ref Range   Prothrombin Time 20.2 (H) 11.4 - 15.2 seconds   INR 1.7 (H) 0.8 - 1.2    Comment: (NOTE) INR goal varies based on device and disease states. Performed at [facility], [address] [place], [place] [address]   Lipase, blood     Status: None   Collection Time: 10/09/23 10:43 PM  Result Value Ref Range   Lipase 43 11 - 51 U/L    Comment: Performed at [facility], [address] [place], [place] [address]  Hepatic function panel     Status: Abnormal   Collection Time: 10/09/23 10:43 PM  Result Value Ref Range   Total Protein 6.0 (L) 6.5 - 8.1 g/dL   Albumin 3.5 3.5 - 5.0 g/dL   AST 14 (L) 15 - 41 U/L   ALT 16 0 - 44 U/L   Alkaline Phosphatase 75 38 - 126 U/L   Total Bilirubin 0.4 0.0 - 1.2 mg/dL   Bilirubin, Direct 0.1 0.0 - 0.2 mg/dL   Indirect Bilirubin 0.3 0.3 - 0.9 mg/dL    Comment: Performed at [facility], [address] [place], [place] [address]  Troponin I (High Sensitivity)     Status: Abnormal    Collection Time: 10/09/23 10:43 PM  Result Value Ref Range   Troponin I (High Sensitivity) 112 (HH) <18 ng/L    Comment: CRITICAL RESULT CALLED TO, READ BACK BY AND VERIFIED WITH CRABTREE,B ON 10/10/23 AT 0018 BY PURDIE,J (NOTE) Elevated high sensitivity troponin I (hsTnI) values and significant  changes across serial measurements may suggest ACS but many other  chronic and acute conditions are known to elevate hsTnI results.  Refer to the "Links" section for chest pain algorithms and additional  guidance. Performed at [facility], [address] [place], [place] [address]    CT Angio Chest/Abd/Pel for Dissection W and/or Wo Contrast Result Date: 10/10/2023 CLINICAL DATA:  Chest pain blurry vision EXAM: CT ANGIOGRAPHY CHEST, ABDOMEN AND PELVIS TECHNIQUE: Non-contrast CT of the chest was initially obtained. Multidetector CT imaging through the chest, abdomen and pelvis was performed using the standard protocol during bolus administration of intravenous contrast. Multiplanar reconstructed images and MIPs were obtained and reviewed to evaluate the vascular anatomy. RADIATION DOSE REDUCTION: This exam was performed according to the departmental dose-optimization program which includes automated exposure control, adjustment of the mA and/or kV according to patient size and/or use of iterative reconstruction technique. CONTRAST:  80mL OMNIPAQUE IOHEXOL 350 MG/ML SOLN COMPARISON:  CT 06/07/2023, 11/15/2022 FINDINGS: CTA CHEST FINDINGS Cardiovascular: Non contrasted images of the chest demonstrate no acute intramural hematoma. Moderate severe aortic atherosclerosis. No aneurysm or dissection. Common origin of the right brachiocephalic artery and left common carotid artery. Mild stenosis of the left subclavian artery about a  cm distal to the origin. Normal cardiac size. No pericardial effusion Mediastinum/Nodes: Patent trachea. No thyroid mass. No suspicious lymph nodes. Esophagus within normal limits.  Lungs/Pleura: No acute airspace disease, pleural effusion or pneumothorax. Apical scarring. Musculoskeletal: Sternum is intact.  No acute osseous abnormality. Review of the MIP images confirms the above findings. CTA ABDOMEN AND PELVIS FINDINGS VASCULAR Aorta: Normal caliber aorta without aneurysm, dissection, vasculitis or significant stenosis. Moderate aortic atherosclerosis. 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 Distal vascular patency. No dissection or aneurysm SMA: Patent without evidence of aneurysm, dissection, vasculitis or significant stenosis. Renals: Heavily calcified at the origin. Possible mild stenosis at the right renal origin and moderate stenosis at the left renal artery origin. IMA: Suspect moderate severe stenosis at the origin of the IMA. No aneurysm. Distal patency. Inflow: Patent without evidence of aneurysm, dissection, vasculitis or significant stenosis. Veins: Suboptimally assessed Review of the MIP images confirms the above findings. NON-VASCULAR Hepatobiliary: No focal liver abnormality is seen. Status post cholecystectomy. No biliary dilatation. Pancreas: Unremarkable. No pancreatic ductal dilatation or surrounding inflammatory changes. Spleen: Normal in size without focal abnormality. Adrenals/Urinary Tract: Adrenal glands are within normal limits. Kidneys show no hydronephrosis. 3 mm nonobstructing stone lower pole right kidney. Bladder is unremarkable. Stomach/Bowel: Stomach nonenlarged. No dilated small bowel. No acute bowel wall thickening. Postsurgical changes at the sigmoid colon. Lymphatic: No suspicious lymph nodes Reproductive: Hysterectomy.  No adnexal mass Other: Negative for pelvic effusion or free air. Fat containing supraumbilical ventral hernia. Musculoskeletal: Degenerative changes.  No acute osseous abnormality Review of the MIP images confirms the above findings. IMPRESSION: 1. Negative for acute aortic dissection or aneurysm. 2.  Moderate severe aortic atherosclerosis. Suspected moderate severe stenosis at the origin of the celiac artery and IMA. Possible mild stenosis at the right renal origin and moderate stenosis at the left renal artery origin. 3. 3 mm nonobstructing stone lower pole right kidney. 4. Probable fat containing supraumbilical ventral hernia. 5. Aortic atherosclerosis. Electronically Signed   By: Jasmine Pang M.D.   On: 10/10/2023 00:04   DG Chest 2 View Result Date: 10/09/2023 CLINICAL DATA:  Chest pain. EXAM: CHEST - 2 VIEW COMPARISON:  Radiograph and CT 06/06/2023 FINDINGS: The cardiomediastinal contours are normal. Biapical pleuroparenchymal scarring. Pulmonary vasculature is normal. No consolidation, pleural effusion, or pneumothorax. No acute osseous abnormalities are seen. IMPRESSION: No active cardiopulmonary disease. Electronically Signed   By: Narda Rutherford M.D.   On: 10/09/2023 22:23    Pending Labs Unresulted Labs (From admission, onward)     Start     Ordered   10/11/23 0500  Heparin level (unfractionated)  Daily,   R      10/10/23 0054   10/11/23 0500  CBC  Daily,   R      10/10/23 0054   10/10/23 0800  Heparin level (unfractionated)  Once-Timed,   URGENT        10/10/23 0054            Vitals/Pain Today's Vitals   10/10/23 0600 10/10/23 0615 10/10/23 0630 10/10/23 0634  BP: (!) 95/43 (!) 97/47 (!) 102/49   Pulse: (!) 56 (!) 57 (!) 58   Resp: 18 14 10    Temp:      TempSrc:      SpO2: 100% 100% 99%   Weight:      Height:      PainSc:    Asleep    Isolation Precautions No active isolations  Medications  Medications  heparin ADULT infusion 100 units/mL (25000 units/263mL) (850 Units/hr Intravenous Infusion Verify 10/10/23 0416)  lactated ringers infusion ( Intravenous New Bag/Given 10/10/23 0411)  sodium chloride 0.9 % bolus 1,000 mL (0 mLs Intravenous Stopped 10/09/23 2327)  ondansetron (ZOFRAN) injection 4 mg (4 mg Intravenous Given 10/09/23 2238)  famotidine (PEPCID)  tablet 20 mg (20 mg Oral Given 10/09/23 2308)  alum & mag hydroxide-simeth (MAALOX/MYLANTA) 200-200-20 MG/5ML suspension 30 mL (30 mLs Oral Given 10/09/23 2308)  acetaminophen (TYLENOL) tablet 650 mg (650 mg Oral Given 10/09/23 2308)  iohexol (OMNIPAQUE) 350 MG/ML injection 80 mL (80 mLs Intravenous Contrast Given 10/09/23 2326)  lactated ringers bolus 1,000 mL (0 mLs Intravenous Stopped 10/10/23 0239)  heparin bolus via infusion 2,000 Units (2,000 Units Intravenous Bolus from Bag 10/10/23 0125)  lactated ringers bolus 1,000 mL (0 mLs Intravenous Stopped 10/10/23 0331)  lactated ringers bolus 1,000 mL (0 mLs Intravenous Stopped 10/10/23 0601)    Mobility walks with person assist        R Recommendations: See Admitting Provider Note  Report given to: [facility]

## 2023-10-11 ENCOUNTER — Other Ambulatory Visit (HOSPITAL_COMMUNITY)

## 2023-10-11 ENCOUNTER — Encounter (HOSPITAL_COMMUNITY): Payer: Self-pay | Admitting: Cardiology

## 2023-10-11 ENCOUNTER — Other Ambulatory Visit (HOSPITAL_COMMUNITY): Payer: Self-pay | Admitting: *Deleted

## 2023-10-11 ENCOUNTER — Encounter (HOSPITAL_COMMUNITY)
Admission: EM | Disposition: A | Discharge status: Home / Self Care | Payer: Self-pay | Source: Home / Self Care | Attending: Family Medicine

## 2023-10-11 DIAGNOSIS — I214 Non-ST elevation (NSTEMI) myocardial infarction: Secondary | ICD-10-CM

## 2023-10-11 DIAGNOSIS — R079 Chest pain, unspecified: Secondary | ICD-10-CM | POA: Diagnosis not present

## 2023-10-11 DIAGNOSIS — E119 Type 2 diabetes mellitus without complications: Secondary | ICD-10-CM

## 2023-10-11 DIAGNOSIS — I2510 Atherosclerotic heart disease of native coronary artery without angina pectoris: Secondary | ICD-10-CM

## 2023-10-11 DIAGNOSIS — I10 Essential (primary) hypertension: Secondary | ICD-10-CM | POA: Diagnosis not present

## 2023-10-11 HISTORY — PX: LEFT HEART CATH AND CORONARY ANGIOGRAPHY: CATH118249

## 2023-10-11 LAB — GLUCOSE, CAPILLARY
Glucose-Capillary: 102 mg/dL — ABNORMAL HIGH (ref 70–99)
Glucose-Capillary: 96 mg/dL (ref 70–99)
Glucose-Capillary: 136 mg/dL — ABNORMAL HIGH (ref 70–99)

## 2023-10-11 LAB — PROTIME-INR
INR: 1.8 — ABNORMAL HIGH (ref 0.8–1.2)
Prothrombin Time: 21.4 s — ABNORMAL HIGH (ref 11.4–15.2)

## 2023-10-11 LAB — BASIC METABOLIC PANEL WITH GFR
Anion gap: 8 (ref 5–15)
Anion gap: 8 (ref 5–15)
BUN: 17 mg/dL (ref 8–23)
BUN: 15 mg/dL (ref 8–23)
CO2: 25 mmol/L (ref 22–32)
CO2: 27 mmol/L (ref 22–32)
Calcium: 8.9 mg/dL (ref 8.9–10.3)
Calcium: 9.2 mg/dL (ref 8.9–10.3)
Chloride: 109 mmol/L (ref 98–111)
Chloride: 106 mmol/L (ref 98–111)
Creatinine, Ser: 1.04 mg/dL — ABNORMAL HIGH (ref 0.44–1.00)
Creatinine, Ser: 0.99 mg/dL (ref 0.44–1.00)
GFR, Estimated: 53 mL/min — ABNORMAL LOW (ref >60)
GFR, Estimated: 57 mL/min — ABNORMAL LOW (ref >60)
Glucose, Bld: 108 mg/dL — ABNORMAL HIGH (ref 70–99)
Glucose, Bld: 111 mg/dL — ABNORMAL HIGH (ref 70–99)
Potassium: 3.9 mmol/L (ref 3.5–5.1)
Potassium: 4.1 mmol/L (ref 3.5–5.1)
Sodium: 142 mmol/L (ref 135–145)
Sodium: 141 mmol/L (ref 135–145)

## 2023-10-11 LAB — LIPID PANEL
Cholesterol: 178 mg/dL (ref 0–200)
HDL: 37 mg/dL — ABNORMAL LOW (ref >40)
LDL Cholesterol: 106 mg/dL — ABNORMAL HIGH (ref 0–99)
Total CHOL/HDL Ratio: 4.8 ratio
Triglycerides: 177 mg/dL — ABNORMAL HIGH (ref <150)
VLDL: 35 mg/dL (ref 0–40)

## 2023-10-11 LAB — CBC
HCT: 36.6 % (ref 36.0–46.0)
Hemoglobin: 11.6 g/dL — ABNORMAL LOW (ref 12.0–15.0)
MCH: 29.8 pg (ref 26.0–34.0)
MCHC: 31.7 g/dL (ref 30.0–36.0)
MCV: 94.1 fL (ref 80.0–100.0)
Platelets: 178 10*3/uL (ref 150–400)
RBC: 3.89 MIL/uL (ref 3.87–5.11)
RDW: 12.5 % (ref 11.5–15.5)
WBC: 4.5 10*3/uL (ref 4.0–10.5)
nRBC: 0 % (ref 0.0–0.2)

## 2023-10-11 LAB — HEPARIN LEVEL (UNFRACTIONATED): Heparin Unfractionated: 0.37 [IU]/mL (ref 0.30–0.70)

## 2023-10-11 SURGERY — LEFT HEART CATH AND CORONARY ANGIOGRAPHY
Anesthesia: LOCAL

## 2023-10-11 MED ORDER — SODIUM CHLORIDE 0.9 % IV SOLN: INTRAVENOUS | Status: DC

## 2023-10-11 MED ORDER — DIAZEPAM 2 MG PO TABS
2.0000 mg | ORAL_TABLET | Freq: Once | ORAL | Status: AC | PRN
  Administered 2023-10-11: 2 mg via ORAL
  Filled 2023-10-11: qty 1

## 2023-10-11 MED ORDER — WARFARIN SODIUM 2 MG PO TABS
3.0000 mg | ORAL_TABLET | ORAL | Status: AC
  Administered 2023-10-11: 3 mg via ORAL
  Filled 2023-10-11: qty 1

## 2023-10-11 MED ORDER — WARFARIN - PHARMACIST DOSING INPATIENT: Freq: Every day | Status: DC

## 2023-10-11 MED ORDER — VERAPAMIL HCL 2.5 MG/ML IV SOLN
INTRAVENOUS | Status: DC | PRN
  Administered 2023-10-11: 10 mL via INTRA_ARTERIAL

## 2023-10-11 MED ORDER — VERAPAMIL HCL 2.5 MG/ML IV SOLN
INTRAVENOUS | Status: AC
  Filled 2023-10-11: qty 2

## 2023-10-11 MED ORDER — FENTANYL CITRATE (PF) 100 MCG/2ML IJ SOLN
INTRAMUSCULAR | Status: AC
  Filled 2023-10-11: qty 2

## 2023-10-11 MED ORDER — SODIUM CHLORIDE 0.9 % IV SOLN: INTRAVENOUS | Status: AC

## 2023-10-11 MED ORDER — HEPARIN SODIUM (PORCINE) 1000 UNIT/ML IJ SOLN
INTRAMUSCULAR | Status: AC
  Filled 2023-10-11: qty 10

## 2023-10-11 MED ORDER — SODIUM CHLORIDE 0.9% FLUSH: 3.0000 mL | INTRAVENOUS | Status: DC | PRN

## 2023-10-11 MED ORDER — SODIUM CHLORIDE 0.9% FLUSH
3.0000 mL | Freq: Two times a day (BID) | INTRAVENOUS | Status: DC
  Administered 2023-10-12: 3 mL via INTRAVENOUS

## 2023-10-11 MED ORDER — ACETAMINOPHEN 325 MG PO TABS: 650.0000 mg | ORAL_TABLET | ORAL | Status: DC | PRN

## 2023-10-11 MED ORDER — SODIUM CHLORIDE 0.9 % IV SOLN: 250.0000 mL | INTRAVENOUS | Status: DC | PRN

## 2023-10-11 MED ORDER — FENTANYL CITRATE (PF) 100 MCG/2ML IJ SOLN
INTRAMUSCULAR | Status: DC | PRN
  Administered 2023-10-11: 50 ug via INTRAVENOUS

## 2023-10-11 MED ORDER — MIDAZOLAM HCL 2 MG/2ML IJ SOLN
INTRAMUSCULAR | Status: AC
  Filled 2023-10-11: qty 2

## 2023-10-11 MED ORDER — IOHEXOL 350 MG/ML SOLN
INTRAVENOUS | Status: DC | PRN
  Administered 2023-10-11: 47 mL

## 2023-10-11 MED ORDER — LABETALOL HCL 5 MG/ML IV SOLN: 10.0000 mg | INTRAVENOUS | Status: AC | PRN

## 2023-10-11 MED ORDER — HEPARIN (PORCINE) IN NACL 2000-0.9 UNIT/L-% IV SOLN
INTRAVENOUS | Status: DC | PRN
  Administered 2023-10-11: 1000 mL

## 2023-10-11 MED ORDER — LIDOCAINE HCL (PF) 1 % IJ SOLN
INTRAMUSCULAR | Status: DC | PRN
  Administered 2023-10-11: 2 mL

## 2023-10-11 MED ORDER — LIDOCAINE HCL (PF) 1 % IJ SOLN
INTRAMUSCULAR | Status: AC
Start: 2023-10-11 — End: ?
  Filled 2023-10-11: qty 30

## 2023-10-11 MED ORDER — HEPARIN SODIUM (PORCINE) 1000 UNIT/ML IJ SOLN
INTRAMUSCULAR | Status: DC | PRN
  Administered 2023-10-11: 3500 [IU] via INTRAVENOUS

## 2023-10-11 MED ORDER — HYDRALAZINE HCL 20 MG/ML IJ SOLN: 10.0000 mg | INTRAMUSCULAR | Status: AC | PRN

## 2023-10-11 MED ORDER — MIDAZOLAM HCL 2 MG/2ML IJ SOLN
INTRAMUSCULAR | Status: DC | PRN
  Administered 2023-10-11: 1 mg via INTRAVENOUS

## 2023-10-11 SURGICAL SUPPLY — 10 items
CATH INFINITI AMBI 5FR TG (CATHETERS) IMPLANT
COVER PRB 48X5XTLSCP FOLD TPE (BAG) IMPLANT
DEVICE RAD COMP TR BAND LRG (VASCULAR PRODUCTS) IMPLANT
DEVICE RAD TR BAND REGULAR (VASCULAR PRODUCTS) IMPLANT
GLIDESHEATH SLEND SS 6F .021 (SHEATH) IMPLANT
GUIDEWIRE INQWIRE 1.5J.035X260 (WIRE) IMPLANT
INQWIRE 1.5J .035X260CM (WIRE) ×1 IMPLANT
KIT SINGLE USE MANIFOLD (KITS) IMPLANT
PACK CARDIAC CATHETERIZATION (CUSTOM PROCEDURE TRAY) ×1 IMPLANT
SET ATX-X65L (MISCELLANEOUS) IMPLANT

## 2023-10-11 NOTE — Plan of Care (Signed)
   Problem: Education: Goal: Knowledge of General Education information will improve Description Including pain rating scale, medication(s)/side effects and non-pharmacologic comfort measures Outcome: Progressing   Problem: Clinical Measurements: Goal: Ability to maintain clinical measurements within normal limits will improve Outcome: Progressing   Problem: Activity: Goal: Risk for activity intolerance will decrease Outcome: Progressing

## 2023-10-11 NOTE — H&P (View-Only) (Signed)
 Rounding Note    Patient Name: [name] Date of Encounter: 10/11/2023  [facility] Cardiologist: Chilton Si, MD   Subjective   Denies any chest pain overnight or this morning but has not been out of bed. Breathing at baseline. Intermittent belching this morning.   Inpatient Medications    Scheduled Meds:  amitriptyline  25 mg Oral QHS   anastrozole  1 mg Oral QHS   aspirin EC  81 mg Oral Daily   budesonide  0.5 mg Nebulization BID   dorzolamide-timolol  1 drop Right Eye BID   insulin aspart  0-5 Units Subcutaneous QHS   insulin aspart  0-6 Units Subcutaneous TID WC   isosorbide mononitrate  60 mg Oral Daily   latanoprost  1 drop Left Eye QHS   metoprolol tartrate  12.5 mg Oral BID   pantoprazole  40 mg Oral Daily   sodium chloride flush  3 mL Intravenous Q12H   sodium chloride flush  3 mL Intravenous Q12H   cyanocobalamin  100 mcg Oral Daily   Continuous Infusions:  sodium chloride     heparin 850 Units/hr (10/11/23 0637)   lactated ringers Stopped (10/11/23 0015)   PRN Meds: sodium chloride, acetaminophen **OR** acetaminophen, albuterol, bisacodyl, ondansetron **OR** ondansetron (ZOFRAN) IV, mouth rinse, polyethylene glycol, polyvinyl alcohol, sodium chloride flush, traZODone   Vital Signs    Vitals:   10/10/23 2020 10/10/23 2059 10/10/23 2114 10/11/23 0425  BP: 103/65  103/65 (!) 147/66  Pulse: 69  84 69  Resp: 20   18  Temp: 98.2 F (36.8 C)   (!) 97.5 F (36.4 C)  TempSrc: Oral   Oral  SpO2: 97% 93%  93%  Weight:      Height:        Intake/Output Summary (Last 24 hours) at 10/11/2023 0804 Last data filed at 10/11/2023 0414 Gross per 24 hour  Intake 2211.89 ml  Output --  Net 2211.89 ml      10/09/2023    9:49 PM 10/04/2023    1:09 PM 06/06/2023    3:53 PM  Last 3 Weights  Weight (lbs) 155 lb 157 lb 12.8 oz 162 lb  Weight (kg) 70.308 kg 71.578 kg 73.483 kg      Telemetry    NSR, HR in 60's to 70's. - Personally  Reviewed  Physical Exam   GEN: Pleasant female appearing in no acute distress.   Neck: No JVD Cardiac: RRR, no murmurs, rubs, or gallops.  Respiratory: Clear to auscultation bilaterally. GI: Soft, nontender, non-distended  MS: No pitting edema; No deformity. Neuro:  Nonfocal  Psych: Normal affect   Labs    High Sensitivity Troponin:   Recent Labs  Lab 10/09/23 2152 10/09/23 2243  TROPONINIHS 99* 112*     Chemistry Recent Labs  Lab 10/09/23 2152 10/09/23 2243 10/10/23 0952 10/11/23 0452  NA 136  --  141 142  K 3.9  --  4.0 3.9  CL 103  --  108 109  CO2 22  --  26 25  GLUCOSE 145*  --  111* 108*  BUN 26*  --  17 17  CREATININE 1.55*  --  1.11* 1.04*  CALCIUM 9.5  --  9.1 8.9  PROT  --  6.0*  --   --   ALBUMIN  --  3.5  --   --   AST  --  14*  --   --   ALT  --  16  --   --  ALKPHOS  --  75  --   --   BILITOT  --  0.4  --   --   GFRNONAA 33*  --  49* 53*  ANIONGAP 11  --  7 8    Lipids  Recent Labs  Lab 10/11/23 0452  CHOL 178  TRIG 177*  HDL 37*  LDLCALC 106*  CHOLHDL 4.8    Hematology Recent Labs  Lab 10/09/23 2152 10/11/23 0452  WBC 7.9 4.5  RBC 4.42 3.89  HGB 13.2 11.6*  HCT 42.3 36.6  MCV 95.7 94.1  MCH 29.9 29.8  MCHC 31.2 31.7  RDW 12.7 12.5  PLT 223 178   Thyroid No results for input(s): "TSH", "FREET4" in the last 168 hours.  BNPNo results for input(s): "BNP", "PROBNP" in the last 168 hours.  DDimer No results for input(s): "DDIMER" in the last 168 hours.   Radiology    CT Angio Chest/Abd/Pel for Dissection W and/or Wo Contrast Result Date: 10/10/2023 CLINICAL DATA:  Chest pain blurry vision EXAM: CT ANGIOGRAPHY CHEST, ABDOMEN AND PELVIS TECHNIQUE: Non-contrast CT of the chest was initially obtained. Multidetector CT imaging through the chest, abdomen and pelvis was performed using the standard protocol during bolus administration of intravenous contrast. Multiplanar reconstructed images and MIPs were obtained and reviewed to  evaluate the vascular anatomy. RADIATION DOSE REDUCTION: This exam was performed according to the departmental dose-optimization program which includes automated exposure control, adjustment of the mA and/or kV according to patient size and/or use of iterative reconstruction technique. CONTRAST:  80mL OMNIPAQUE IOHEXOL 350 MG/ML SOLN COMPARISON:  CT 06/07/2023, 11/15/2022 FINDINGS: CTA CHEST FINDINGS Cardiovascular: Non contrasted images of the chest demonstrate no acute intramural hematoma. Moderate severe aortic atherosclerosis. No aneurysm or dissection. Common origin of the right brachiocephalic artery and left common carotid artery. Mild stenosis of the left subclavian artery about a cm distal to the origin. Normal cardiac size. No pericardial effusion Mediastinum/Nodes: Patent trachea. No thyroid mass. No suspicious lymph nodes. Esophagus within normal limits. Lungs/Pleura: No acute airspace disease, pleural effusion or pneumothorax. Apical scarring. Musculoskeletal: Sternum is intact.  No acute osseous abnormality. Review of the MIP images confirms the above findings. CTA ABDOMEN AND PELVIS FINDINGS VASCULAR Aorta: Normal caliber aorta without aneurysm, dissection, vasculitis or significant stenosis. Moderate aortic atherosclerosis. 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 Distal vascular patency. No dissection or aneurysm SMA: Patent without evidence of aneurysm, dissection, vasculitis or significant stenosis. Renals: Heavily calcified at the origin. Possible mild stenosis at the right renal origin and moderate stenosis at the left renal artery origin. IMA: Suspect moderate severe stenosis at the origin of the IMA. No aneurysm. Distal patency. Inflow: Patent without evidence of aneurysm, dissection, vasculitis or significant stenosis. Veins: Suboptimally assessed Review of the MIP images confirms the above findings. NON-VASCULAR Hepatobiliary: No focal liver abnormality is  seen. Status post cholecystectomy. No biliary dilatation. Pancreas: Unremarkable. No pancreatic ductal dilatation or surrounding inflammatory changes. Spleen: Normal in size without focal abnormality. Adrenals/Urinary Tract: Adrenal glands are within normal limits. Kidneys show no hydronephrosis. 3 mm nonobstructing stone lower pole right kidney. Bladder is unremarkable. Stomach/Bowel: Stomach nonenlarged. No dilated small bowel. No acute bowel wall thickening. Postsurgical changes at the sigmoid colon. Lymphatic: No suspicious lymph nodes Reproductive: Hysterectomy.  No adnexal mass Other: Negative for pelvic effusion or free air. Fat containing supraumbilical ventral hernia. Musculoskeletal: Degenerative changes.  No acute osseous abnormality Review of the MIP images confirms the above findings. IMPRESSION:  1. Negative for acute aortic dissection or aneurysm. 2. Moderate severe aortic atherosclerosis. Suspected moderate severe stenosis at the origin of the celiac artery and IMA. Possible mild stenosis at the right renal origin and moderate stenosis at the left renal artery origin. 3. 3 mm nonobstructing stone lower pole right kidney. 4. Probable fat containing supraumbilical ventral hernia. 5. Aortic atherosclerosis. Electronically Signed   By: Jasmine Pang M.D.   On: 10/10/2023 00:04   DG Chest 2 View Result Date: 10/09/2023 CLINICAL DATA:  Chest pain. EXAM: CHEST - 2 VIEW COMPARISON:  Radiograph and CT 06/06/2023 FINDINGS: The cardiomediastinal contours are normal. Biapical pleuroparenchymal scarring. Pulmonary vasculature is normal. No consolidation, pleural effusion, or pneumothorax. No acute osseous abnormalities are seen. IMPRESSION: No active cardiopulmonary disease. Electronically Signed   By: Narda Rutherford M.D.   On: 10/09/2023 22:23    Cardiac Studies   Echocardiogram: Pending  Cardiac Catheterization: 05/2015 Mid LAD lesion, 50% stenosed. FFR of this lesion showed value of 0.85. This is  felt to be nonsignificant. Normal LVEDP.    Continue aggressive medical therapy. Would consider adding long-acting nitrate as the patient may have some component of vasospasm. Of note, in the future , if repeat catheterization was needed , would consider using a 5 [place] guide catheter as her left main is very short and the catheter tends to deep seat  Into either the LAD or the circumflex.    She can resume Coumadin  With follow-up appointment later this week.  Patient Profile     83 y.o. female w/ PMH of CAD (s/p cath in 2016 showing 50% mid-LAD stenosis, low-risk NST in 10/2019), chronic HFpEF, HTN, HLD, Type 2 DM,  prior DVT (on Coumadin), carotid artery stenosis (s/p L CEA in 03/2022) and history of breast cancer (s/p lumpectomy and XRT) who is currently admitted for an NSTEMI.  Assessment & Plan    1. NSTEMI - Presented with episodes of chest pain radiating to her back along with intermittent dizziness and hypotension.  Initial and repeat Hs Troponin values elevated at 99 and 112.  EKG shows RBBB and LPFB with inferior T wave inversion. - She was evaluated by Dr. Wyline Mood yesterday and recommendations were to proceed with a cardiac catheterization but this was on hold given her INR to an AKI on admission. Was recommended to plan for catheterization today pending renal function and INR. INR 1.8 today and creatinine improved to 1.04. Checked with cath lab in regard to INR and cleared to proceed today.  The patient understands that risks include but are not limited to stroke (1 in 1000), death (1 in 1000), kidney failure [usually temporary] (1 in 500), bleeding (1 in 200), allergic reaction [possibly serious] (1 in 200).   - Continue ASA 81mg  daily, Imdur 60mg  daily, Lopressor 12.5mg  BID (on Coreg 6.25mg  BID prior to admission) and IV Heparin.   2. Chronic HFpEF - Repeat echo pending and scheduled for today. Lasix, Farxiga and Olmesartan were held on admission given her AKI. Was receiving IV  fluids on admission and stopped this morning. Resume medications as appropriate following cath.   3. AKI - Creatinine was elevated at 1.55 on admission, improved to 1.04 today. PTA Farxiga, Lasix 40 mg daily and Benicar 40 mg daily currently held. Was receiving IV fluids until this morning.   4. History of Unprovoked DVT - Coumadin held on admission in preparation for cardiac catheterization and currently on IV Heparin.  5. HLD - Previously intolerant to statins due  to myalgias and also had myalgias on Repatha and had elevated LFT's and abnormal kidney function with Nexlizet. Unable to afford Praluent and Leqvio would have a similar cost by review of Pharm. D notes. Was recommended by the Pharm.D. clinic to consider re-enrolling in the future if [facility] available.   For questions or updates, please contact [facility] Please consult [email or web address] for contact info under        Signed, Ellsworth Lennox, PA-C  10/11/2023, 8:04 AM     Patient seen and examined.  I agree with findings as noted above by [place]. On exam, the patient denies chest pain Lungs clear to auscultation Cardiac exam regular rate and rhythm.  No significant murmurs. Extremities without lower extremity edema.  Plan as noted above.  Patient with NSTEMI.  She will be transferred to [facility] today for cardiac catheterization.  Risk/benefits described.  Patient understands and agrees to proceed.  Will follow renal function closely after procedure.  With history of unprovoked DVT will need to resume Coumadin or some form of anticoagulation after.  Dietrich Pates, MD.

## 2023-10-11 NOTE — Progress Notes (Signed)
 HARMACY - ANTICOAGULATION CONSULT NOTE  Pharmacy Consult:  Coumadin Indication: History of DVT  Allergies  Allergen Reactions   Alphagan [Brimonidine] Itching   Diflunisal Swelling    Other reaction(s): ENTIRE BODY SWELLING   Vioxx [Rofecoxib] Shortness Of Breath   Metformin And Related     Kidney failure   Nexlizet [Bempedoic Acid-Ezetimibe]     Causes elevated Liver and Kidney function   Repatha [Evolocumab]     MYALGIAS   Codeine Rash   Elemental Sulfur Rash   Motrin [Ibuprofen] Rash   Penicillins Rash   Pravastatin Rash    Patient Measurements: Height: 5\' 2"  (157.5 cm) Weight: 72.6 kg (160 lb 1.6 oz) IBW/kg (Calculated) : 50.1 HEPARIN DW (KG): 65.6  Vital Signs: Temp: 98.1 F (36.7 C) (04/08 1555) Temp Source: Oral (04/08 1555) BP: 128/67 (04/08 1555) Pulse Rate: 78 (04/08 1555)  Labs: Recent Labs    10/09/23 2152 10/09/23 2243 10/10/23 0819 10/10/23 0952 10/10/23 1800 10/11/23 0452 10/11/23 0908  HGB 13.2  --   --   --   --  11.6*  --   HCT 42.3  --   --   --   --  36.6  --   PLT 223  --   --   --   --  178  --   LABPROT  --  20.2* 22.5*  --   --  21.4*  --   INR  --  1.7* 2.0*  --   --  1.8*  --   HEPARINUNFRC  --   --  0.47  --  0.40 0.37  --   CREATININE 1.55*  --   --  1.11*  --  1.04* 0.99  TROPONINIHS 99* 112*  --   --   --   --   --     Estimated Creatinine Clearance: 40.2 mL/min (by C-G formula based on SCr of 0.99 mg/dL).   Assessment: 75 YOF with history of DVT on Coumadin PTA.  Patient was transitioned to IV heparin for ACS.  Now s/p cath and Coumadin to be resumed.  No bridge needed per discussion with MD.  INR sub-therapeutic at 1.8; no bleeding reported. Home Coumadin regimen:  2mg  PO daily  Goal of Therapy:  INR 2-3 Monitor platelets by anticoagulation protocol: Yes   Plan:  Coumadin 3mg  PO today Daily PT / INR  Tramya Schoenfelder D. Laney Potash, PharmD, BCPS, BCCCP 10/11/2023, 4:52 PM

## 2023-10-11 NOTE — TOC Initial Note (Signed)
 Transition of Care [facility]) - Initial/Assessment Note    Patient Details  Name: [name] MRN: [id number] Date of Birth: [birth year]  Transition of Care [facility]) CM/SW Contact:    Karn Cassis, LCSW Phone Number: 10/11/2023, 7:58 AM  Clinical Narrative: Assessment completed due to high risk readmission score. Pt reports she lives alone and is independent with ADLs. She sometimes uses a cane to ambulate. Pt drives herself to appointments. No current home health services. Pt plans to return home when medically stable. TOC will follow.                   Expected Discharge Plan: Home/Self Care Barriers to Discharge: Continued Medical Work up   Patient Goals and CMS Choice Patient states their goals for this hospitalization and ongoing recovery are:: return home   Choice offered to / list presented to : Patient [place] ownership interest in [facility].provided to::  (n/a)    Expected Discharge Plan and Services In-house Referral: Clinical Social Work     Living arrangements for the past 2 months: Single Family Home                                      Prior Living Arrangements/Services Living arrangements for the past 2 months: Single Family Home Lives with:: Self Patient language and need for interpreter reviewed:: Yes Do you feel safe going back to the place where you live?: Yes      Need for Family Participation in Patient Care: No (Comment)   Current home services: DME (cane, walker) Criminal Activity/Legal Involvement Pertinent to Current Situation/Hospitalization: No - Comment as needed  Activities of Daily Living      Permission Sought/Granted                  Emotional Assessment     Affect (typically observed): Appropriate Orientation: : Oriented to Self, Oriented to Place, Oriented to  Time, Oriented to Situation Alcohol / Substance Use: Not Applicable Psych Involvement: No (comment)  Admission diagnosis:  Aortic  atherosclerosis (HCC) [I70.0] NSTEMI (non-ST elevated myocardial infarction) (HCC) [I21.4] 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] AKI (acute kidney injury) (HCC) [N17.9] Chest pain [R07.9] Hypotension, unspecified hypotension type [I95.9] Chest pain, unspecified type [R07.9] Patient Active Problem List   Diagnosis Date Noted   Chest pain 10/10/2023   Carotid stenosis 03/10/2022   Carotid stenosis, asymptomatic 03/10/2022   OSA (obstructive sleep apnea) 12/17/2021   CAD in native artery 03/10/2021   Snoring 03/10/2021   Ductal carcinoma in situ (DCIS) of right breast 12/29/2020   Long term (current) use of anticoagulants 06/24/2020   Medication management 07/01/2016   Abnormal nuclear stress test    Chronic diastolic heart failure (HCC) 05/20/2015   Hyperlipidemia 05/20/2015   Annual physical exam 06/13/2014   Antral gastritis 05/07/2014   Anemia 05/06/2014   Renal failure 05/06/2014   Type 2 diabetes mellitus without complication (HCC)    RUQ pain 05/03/2014   Rectal bleeding 12/10/2013   Pelvic mass in female 11/06/2013   Epistaxis 08/19/2012   DVT (deep venous thrombosis) (HCC) 08/19/2012   DM (diabetes mellitus) (HCC) 08/19/2012   HTN (hypertension) 08/19/2012   PCP:  [name], MD Pharmacy:   [id number] - [place], [place] - [address] [facility] [address] [place] [address] Phone: [phone] Fax: [phone]  [place] [facility] [address] [place] [address] Phone: [phone] Fax: [phone]  [facility] - [address], [place] - [address] [place] [address] Phone: [phone] Fax: [phone]  [facility] - [place], [place] - [address] [place] [address] Phone: [phone] Fax: [phone]     Social Drivers of Health (SDOH) Social  History: SDOH Screenings   Food Insecurity: No Food Insecurity (10/10/2023)  Housing: Unknown (10/10/2023)  Transportation Needs: No Transportation Needs (10/10/2023)  Utilities: Not At Risk (10/10/2023)  Alcohol Screen: Low Risk  (09/17/2021)  Depression (PHQ2-9): Low Risk  (09/17/2021)  Financial Resource Strain: Low Risk  (09/17/2021)  Physical Activity: Inactive (09/17/2021)  Social Connections: Moderately Integrated (10/10/2023)  Stress: No Stress Concern Present (09/17/2021)  Tobacco Use: Medium Risk (10/09/2023)   SDOH Interventions:     Readmission Risk Interventions    10/11/2023    7:56 AM 10/10/2023    5:58 PM  Readmission Risk Prevention Plan  Transportation Screening Complete Complete  PCP or Specialist Appt within 5-7 Days  Complete  Home Care Screening  Complete  Medication Review (RN CM)  Complete  HRI or Home Care Consult Complete   Social Work Consult for Recovery Care Planning/Counseling Complete   Palliative Care Screening Not Applicable   Medication Review Oceanographer) Complete

## 2023-10-11 NOTE — Interval H&P Note (Signed)
 History and Physical Interval Note:  10/11/2023 11:59 AM  [name]  has presented today for surgery, with the diagnosis of nstemi.  The various methods of treatment have been discussed with the patient and family. After consideration of risks, benefits and other options for treatment, the patient has consented to  Procedure(s): LEFT HEART CATH AND CORONARY ANGIOGRAPHY (N/A) as a surgical intervention.  The patient's history has been reviewed, patient examined, no change in status, stable for surgery.  I have reviewed the patient's chart and labs.  Questions were answered to the patient's satisfaction.     Jettie Mannor J Maikel Neisler

## 2023-10-11 NOTE — Progress Notes (Signed)
 Patient reported having a tick bite on her right side, noted tick attached to skin. Removed tick from right side, cleaned area and applied dressing. Placed tick in specimen container. MD Thomes Dinning made aware.

## 2023-10-11 NOTE — Progress Notes (Addendum)
 Rounding Note    Patient Name: [name] Date of Encounter: 10/11/2023  [facility] Cardiologist: Chilton Si, MD   Subjective   Denies any chest pain overnight or this morning but has not been out of bed. Breathing at baseline. Intermittent belching this morning.   Inpatient Medications    Scheduled Meds:  amitriptyline  25 mg Oral QHS   anastrozole  1 mg Oral QHS   aspirin EC  81 mg Oral Daily   budesonide  0.5 mg Nebulization BID   dorzolamide-timolol  1 drop Right Eye BID   insulin aspart  0-5 Units Subcutaneous QHS   insulin aspart  0-6 Units Subcutaneous TID WC   isosorbide mononitrate  60 mg Oral Daily   latanoprost  1 drop Left Eye QHS   metoprolol tartrate  12.5 mg Oral BID   pantoprazole  40 mg Oral Daily   sodium chloride flush  3 mL Intravenous Q12H   sodium chloride flush  3 mL Intravenous Q12H   cyanocobalamin  100 mcg Oral Daily   Continuous Infusions:  sodium chloride     heparin 850 Units/hr (10/11/23 0637)   lactated ringers Stopped (10/11/23 0015)   PRN Meds: sodium chloride, acetaminophen **OR** acetaminophen, albuterol, bisacodyl, ondansetron **OR** ondansetron (ZOFRAN) IV, mouth rinse, polyethylene glycol, polyvinyl alcohol, sodium chloride flush, traZODone   Vital Signs    Vitals:   10/10/23 2020 10/10/23 2059 10/10/23 2114 10/11/23 0425  BP: 103/65  103/65 (!) 147/66  Pulse: 69  84 69  Resp: 20   18  Temp: 98.2 F (36.8 C)   (!) 97.5 F (36.4 C)  TempSrc: Oral   Oral  SpO2: 97% 93%  93%  Weight:      Height:        Intake/Output Summary (Last 24 hours) at 10/11/2023 0804 Last data filed at 10/11/2023 0414 Gross per 24 hour  Intake 2211.89 ml  Output --  Net 2211.89 ml      10/09/2023    9:49 PM 10/04/2023    1:09 PM 06/06/2023    3:53 PM  Last 3 Weights  Weight (lbs) 155 lb 157 lb 12.8 oz 162 lb  Weight (kg) 70.308 kg 71.578 kg 73.483 kg      Telemetry    NSR, HR in 60's to 70's. - Personally  Reviewed  Physical Exam   GEN: Pleasant female appearing in no acute distress.   Neck: No JVD Cardiac: RRR, no murmurs, rubs, or gallops.  Respiratory: Clear to auscultation bilaterally. GI: Soft, nontender, non-distended  MS: No pitting edema; No deformity. Neuro:  Nonfocal  Psych: Normal affect   Labs    High Sensitivity Troponin:   Recent Labs  Lab 10/09/23 2152 10/09/23 2243  TROPONINIHS 99* 112*     Chemistry Recent Labs  Lab 10/09/23 2152 10/09/23 2243 10/10/23 0952 10/11/23 0452  NA 136  --  141 142  K 3.9  --  4.0 3.9  CL 103  --  108 109  CO2 22  --  26 25  GLUCOSE 145*  --  111* 108*  BUN 26*  --  17 17  CREATININE 1.55*  --  1.11* 1.04*  CALCIUM 9.5  --  9.1 8.9  PROT  --  6.0*  --   --   ALBUMIN  --  3.5  --   --   AST  --  14*  --   --   ALT  --  16  --   --  ALKPHOS  --  75  --   --   BILITOT  --  0.4  --   --   GFRNONAA 33*  --  49* 53*  ANIONGAP 11  --  7 8    Lipids  Recent Labs  Lab 10/11/23 0452  CHOL 178  TRIG 177*  HDL 37*  LDLCALC 106*  CHOLHDL 4.8    Hematology Recent Labs  Lab 10/09/23 2152 10/11/23 0452  WBC 7.9 4.5  RBC 4.42 3.89  HGB 13.2 11.6*  HCT 42.3 36.6  MCV 95.7 94.1  MCH 29.9 29.8  MCHC 31.2 31.7  RDW 12.7 12.5  PLT 223 178   Thyroid No results for input(s): "TSH", "FREET4" in the last 168 hours.  BNPNo results for input(s): "BNP", "PROBNP" in the last 168 hours.  DDimer No results for input(s): "DDIMER" in the last 168 hours.   Radiology    CT Angio Chest/Abd/Pel for Dissection W and/or Wo Contrast Result Date: 10/10/2023 CLINICAL DATA:  Chest pain blurry vision EXAM: CT ANGIOGRAPHY CHEST, ABDOMEN AND PELVIS TECHNIQUE: Non-contrast CT of the chest was initially obtained. Multidetector CT imaging through the chest, abdomen and pelvis was performed using the standard protocol during bolus administration of intravenous contrast. Multiplanar reconstructed images and MIPs were obtained and reviewed to  evaluate the vascular anatomy. RADIATION DOSE REDUCTION: This exam was performed according to the departmental dose-optimization program which includes automated exposure control, adjustment of the mA and/or kV according to patient size and/or use of iterative reconstruction technique. CONTRAST:  80mL OMNIPAQUE IOHEXOL 350 MG/ML SOLN COMPARISON:  CT 06/07/2023, 11/15/2022 FINDINGS: CTA CHEST FINDINGS Cardiovascular: Non contrasted images of the chest demonstrate no acute intramural hematoma. Moderate severe aortic atherosclerosis. No aneurysm or dissection. Common origin of the right brachiocephalic artery and left common carotid artery. Mild stenosis of the left subclavian artery about a cm distal to the origin. Normal cardiac size. No pericardial effusion Mediastinum/Nodes: Patent trachea. No thyroid mass. No suspicious lymph nodes. Esophagus within normal limits. Lungs/Pleura: No acute airspace disease, pleural effusion or pneumothorax. Apical scarring. Musculoskeletal: Sternum is intact.  No acute osseous abnormality. Review of the MIP images confirms the above findings. CTA ABDOMEN AND PELVIS FINDINGS VASCULAR Aorta: Normal caliber aorta without aneurysm, dissection, vasculitis or significant stenosis. Moderate aortic atherosclerosis. 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 Distal vascular patency. No dissection or aneurysm SMA: Patent without evidence of aneurysm, dissection, vasculitis or significant stenosis. Renals: Heavily calcified at the origin. Possible mild stenosis at the right renal origin and moderate stenosis at the left renal artery origin. IMA: Suspect moderate severe stenosis at the origin of the IMA. No aneurysm. Distal patency. Inflow: Patent without evidence of aneurysm, dissection, vasculitis or significant stenosis. Veins: Suboptimally assessed Review of the MIP images confirms the above findings. NON-VASCULAR Hepatobiliary: No focal liver abnormality is  seen. Status post cholecystectomy. No biliary dilatation. Pancreas: Unremarkable. No pancreatic ductal dilatation or surrounding inflammatory changes. Spleen: Normal in size without focal abnormality. Adrenals/Urinary Tract: Adrenal glands are within normal limits. Kidneys show no hydronephrosis. 3 mm nonobstructing stone lower pole right kidney. Bladder is unremarkable. Stomach/Bowel: Stomach nonenlarged. No dilated small bowel. No acute bowel wall thickening. Postsurgical changes at the sigmoid colon. Lymphatic: No suspicious lymph nodes Reproductive: Hysterectomy.  No adnexal mass Other: Negative for pelvic effusion or free air. Fat containing supraumbilical ventral hernia. Musculoskeletal: Degenerative changes.  No acute osseous abnormality Review of the MIP images confirms the above findings. IMPRESSION:  1. Negative for acute aortic dissection or aneurysm. 2. Moderate severe aortic atherosclerosis. Suspected moderate severe stenosis at the origin of the celiac artery and IMA. Possible mild stenosis at the right renal origin and moderate stenosis at the left renal artery origin. 3. 3 mm nonobstructing stone lower pole right kidney. 4. Probable fat containing supraumbilical ventral hernia. 5. Aortic atherosclerosis. Electronically Signed   By: Jasmine Pang M.D.   On: 10/10/2023 00:04   DG Chest 2 View Result Date: 10/09/2023 CLINICAL DATA:  Chest pain. EXAM: CHEST - 2 VIEW COMPARISON:  Radiograph and CT 06/06/2023 FINDINGS: The cardiomediastinal contours are normal. Biapical pleuroparenchymal scarring. Pulmonary vasculature is normal. No consolidation, pleural effusion, or pneumothorax. No acute osseous abnormalities are seen. IMPRESSION: No active cardiopulmonary disease. Electronically Signed   By: Narda Rutherford M.D.   On: 10/09/2023 22:23    Cardiac Studies   Echocardiogram: Pending  Cardiac Catheterization: 05/2015 Mid LAD lesion, 50% stenosed. FFR of this lesion showed value of 0.85. This is  felt to be nonsignificant. Normal LVEDP.    Continue aggressive medical therapy. Would consider adding long-acting nitrate as the patient may have some component of vasospasm. Of note, in the future , if repeat catheterization was needed , would consider using a 5 [place] guide catheter as her left main is very short and the catheter tends to deep seat  Into either the LAD or the circumflex.    She can resume Coumadin  With follow-up appointment later this week.  Patient Profile     83 y.o. female w/ PMH of CAD (s/p cath in 2016 showing 50% mid-LAD stenosis, low-risk NST in 10/2019), chronic HFpEF, HTN, HLD, Type 2 DM,  prior DVT (on Coumadin), carotid artery stenosis (s/p L CEA in 03/2022) and history of breast cancer (s/p lumpectomy and XRT) who is currently admitted for an NSTEMI.  Assessment & Plan    1. NSTEMI - Presented with episodes of chest pain radiating to her back along with intermittent dizziness and hypotension.  Initial and repeat Hs Troponin values elevated at 99 and 112.  EKG shows RBBB and LPFB with inferior T wave inversion. - She was evaluated by Dr. Wyline Mood yesterday and recommendations were to proceed with a cardiac catheterization but this was on hold given her INR to an AKI on admission. Was recommended to plan for catheterization today pending renal function and INR. INR 1.8 today and creatinine improved to 1.04. Checked with cath lab in regard to INR and cleared to proceed today.  The patient understands that risks include but are not limited to stroke (1 in 1000), death (1 in 1000), kidney failure [usually temporary] (1 in 500), bleeding (1 in 200), allergic reaction [possibly serious] (1 in 200).   - Continue ASA 81mg  daily, Imdur 60mg  daily, Lopressor 12.5mg  BID (on Coreg 6.25mg  BID prior to admission) and IV Heparin.   2. Chronic HFpEF - Repeat echo pending and scheduled for today. Lasix, Farxiga and Olmesartan were held on admission given her AKI. Was receiving IV  fluids on admission and stopped this morning. Resume medications as appropriate following cath.   3. AKI - Creatinine was elevated at 1.55 on admission, improved to 1.04 today. PTA Farxiga, Lasix 40 mg daily and Benicar 40 mg daily currently held. Was receiving IV fluids until this morning.   4. History of Unprovoked DVT - Coumadin held on admission in preparation for cardiac catheterization and currently on IV Heparin.  5. HLD - Previously intolerant to statins due  to myalgias and also had myalgias on Repatha and had elevated LFT's and abnormal kidney function with Nexlizet. Unable to afford Praluent and Leqvio would have a similar cost by review of Pharm. D notes. Was recommended by the Pharm.D. clinic to consider re-enrolling in the future if [facility] available.   For questions or updates, please contact [facility] Please consult [email or web address] for contact info under        Signed, Ellsworth Lennox, PA-C  10/11/2023, 8:04 AM     Patient seen and examined.  I agree with findings as noted above by [place]. On exam, the patient denies chest pain Lungs clear to auscultation Cardiac exam regular rate and rhythm.  No significant murmurs. Extremities without lower extremity edema.  Plan as noted above.  Patient with NSTEMI.  She will be transferred to [facility] today for cardiac catheterization.  Risk/benefits described.  Patient understands and agrees to proceed.  Will follow renal function closely after procedure.  With history of unprovoked DVT will need to resume Coumadin or some form of anticoagulation after.  Dietrich Pates, MD.

## 2023-10-11 NOTE — Progress Notes (Signed)
 PROGRESS NOTE  [name], is a 83 y.o. female, DOB - 10-22-1940, WNU:[id number]  Admit date - 10/09/2023   Admitting Physician Dhruv Christina Mariea Clonts, MD  Outpatient Primary MD for the patient is Darlene Found, MD  LOS - 1  Chief Complaint  Patient presents with   Chest Pain      Brief Narrative:  83 y.o. female with medical history significant for hx of  history of CAD, (LHC on 05/26/2015 with 50% Mid LAD stenosis), chronic diastolic heart failure (grade 2), HLD, HTN, prior DVT on warfarin, breast cancer s/p lumpectomy and XRT, DM 2, GERD, carotid stenosis s/p left carotid endarterectomy in 03/2022, right bundle branch block , OSA, COPD admitted on 10/10/23 with Chest Pains with concerns for NSTEMI   -Assessment and Plan: 1)NSTEMI---hx of  history of CAD, (LHC on 05/26/2015 with 50% Mid LAD stenosis), --EKG sinus rhythm nonspecific inferior T wave changes -Initial troponin 99 repeat troponin 112 -Cardiology consult appreciated -Echo requested -Continue aspirin and metoprolol -Continue Imdur -Reports intolerance to statins LDL--106, HDL 37, Trig 177, T. Chole 178 -Coumadin on hold, treated with IV heparin -Lt Coronary angiography on 10/11/2023: LM: Normal LAD: Prox 20% disease, distal 40% disease Diag 1 ostial 70% stenosis, TIMI III flow, unlikely to be acute culprit Lcx: Mid 20% disease RCA: Prox 10% disease LVEDP 12 mmHg No culprit lesion identified to explain NSTEMI. Patient was on warfarin for DVT treatment prior to admission, Cardiologist says " I do not think she needs DAPT. At the most, I would recommend adding one antiplatelet agent, either Aspirin 81 mg daily or Plavix 75 mg daily"--Per cardiologist on 10/11/2023.    2)AKI----acute kidney injury --- Initial creatinine 1.55, -Hold Lasix, hold olmesartan, -Creatinine normalized with hydration --renally adjust medications, avoid nephrotoxic agents / dehydration  / hypotension   3)HFpEF--not volume overloaded -Hold Farxiga, hold  olmesartan, hold Lasix, -Repeat echo requested -Defer further changes to cardiology team   4)DM2-A1C 7.0 reflecting fair diabetic control PTA -Hold Farxiga and Amaryl Use Novolog/Humalog Sliding scale insulin with Accu-Cheks/Fingersticks as ordered    5)H/o DVT--- DVT diagnosed July 2014 -PTA was on Coumadin -Was treated with IV heparin for NSTEMI- -please see discussion above #1 about anticoagulation   6)H/o Breast cancer s/p lumpectomy and XRT--continue Arimidex   7)GERD--- continue Protonix   Status is: Inpatient   Disposition: The patient is from: Home              Anticipated d/c is to: Home------ defer to cardiology team regarding timing of possible discharge home and appropriate medication changes prior to discharge home              Anticipated d/c date is: 1 day              Patient currently is not medically stable to d/c. Barriers: Not Clinically Stable-   Code Status :  -  Code Status: Full Code   Family Communication:   NA (patient is alert, awake and coherent)   DVT Prophylaxis  :   - SCDs   SCDs Start: 10/10/23 0847 Place TED hose Start: 10/10/23 0847   Lab Results  Component Value Date   PLT 178 10/11/2023    Inpatient Medications  Scheduled Meds:  [MAR Hold] amitriptyline  25 mg Oral QHS   [MAR Hold] anastrozole  1 mg Oral QHS   [MAR Hold] aspirin EC  81 mg Oral Daily   [MAR Hold] budesonide  0.5 mg Nebulization BID   [MAR Hold] dorzolamide-timolol  1 drop Right Eye BID   [MAR Hold] insulin aspart  0-5 Units Subcutaneous QHS   [MAR Hold] insulin aspart  0-6 Units Subcutaneous TID WC   [MAR Hold] isosorbide mononitrate  60 mg Oral Daily   [MAR Hold] latanoprost  1 drop Left Eye QHS   [MAR Hold] metoprolol tartrate  12.5 mg Oral BID   [MAR Hold] pantoprazole  40 mg Oral Daily   [MAR Hold] sodium chloride flush  3 mL Intravenous Q12H   [MAR Hold] sodium chloride flush  3 mL Intravenous Q12H   [MAR Hold] cyanocobalamin  100 mcg Oral Daily    Continuous Infusions:  sodium chloride 10 mL/hr at 10/11/23 0932   heparin 850 Units/hr (10/11/23 0637)   PRN Meds:.[MAR Hold] acetaminophen **OR** [MAR Hold] acetaminophen, [MAR Hold] albuterol, [MAR Hold] bisacodyl, fentaNYL, Heparin (Porcine) in NaCl, heparin sodium (porcine), iohexol, lidocaine (PF), midazolam, [MAR Hold] ondansetron **OR** [MAR Hold] ondansetron (ZOFRAN) IV, [MAR Hold] mouth rinse, [MAR Hold] polyethylene glycol, [MAR Hold] polyvinyl alcohol, Radial Cocktail/Verapamil only, [MAR Hold] sodium chloride flush, [MAR Hold] traZODone   Anti-infectives (From admission, onward)    None       Subjective: Su Hoff today has no fevers, no emesis,  No further chest pain,    Voiding ok  No further dizziness, no significant dyspnea  Objective: Vitals:   10/11/23 1219 10/11/23 1224 10/11/23 1229 10/11/23 1245  BP: (!) 151/72 (!) 140/73 (!) 140/73 (!) 148/67  Pulse: 84 88 (!) 0 81  Resp: 17 17  12   Temp:      TempSrc:      SpO2: 95% 95% (!) 89% 93%  Weight:      Height:        Intake/Output Summary (Last 24 hours) at 10/11/2023 1301 Last data filed at 10/11/2023 0414 Gross per 24 hour  Intake 2211.89 ml  Output --  Net 2211.89 ml   Filed Weights   10/09/23 2149  Weight: 70.3 kg    Physical Exam  Gen:- Awake Alert, no conversational dyspnea, no apparent distress  HEENT:- [place].AT, No sclera icterus Neck-Supple Neck,No JVD,.  Lungs-  CTAB , fair symmetrical air movement CV- S1, S2 normal, regular  Abd-  +ve B.Sounds, Abd Soft, No tenderness,    Extremity/Skin:- No  edema, pedal pulses present  Psych-affect is appropriate, oriented x3 Neuro-no new focal deficits, no tremors  Data Reviewed: I have personally reviewed following labs and imaging studies  CBC: Recent Labs  Lab 10/09/23 2152 10/11/23 0452  WBC 7.9 4.5  HGB 13.2 11.6*  HCT 42.3 36.6  MCV 95.7 94.1  PLT 223 178   Basic Metabolic Panel: Recent Labs  Lab 10/04/23 1415  10/09/23 2152 10/10/23 0952 10/11/23 0452 10/11/23 0908  NA 144 136 141 142 141  K 4.2 3.9 4.0 3.9 4.1  CL 106 103 108 109 106  CO2 23 22 26 25 27   GLUCOSE 84 145* 111* 108* 111*  BUN 16 26* 17 17 15   CREATININE 0.88 1.55* 1.11* 1.04* 0.99  CALCIUM 9.9 9.5 9.1 8.9 9.2   GFR: Estimated Creatinine Clearance: 39.6 mL/min (by C-G formula based on SCr of 0.99 mg/dL). Liver Function Tests: Recent Labs  Lab 10/09/23 2243  AST 14*  ALT 16  ALKPHOS 75  BILITOT 0.4  PROT 6.0*  ALBUMIN 3.5   HbA1C: Recent Labs    10/10/23 0952  HGBA1C 7.0*   Radiology Studies: CARDIAC CATHETERIZATION Result Date: 10/11/2023 Images from the original result were not included. Coronary  angiography 10/11/2023: LM: Normal LAD: Prox 20% disease, distal 40% disease         Diag 1 ostial 70% stenosis, TIMI III flow, unlikely to be acute culprit Lcx: Mid 20% disease RCA: Prox 10% disease LVEDP 12 mmHg No culprit lesion identified to explain NSTEMI. Patient is currently on warfarin for DVT treatment. I do not think she needs DAPT. At the most, I would recommend adding one antiplatelet agent, either Aspirin 81 mg daily or Plavix 75 mg daily. Elder Negus, MD   CT Angio Chest/Abd/Pel for Dissection W and/or Wo Contrast Result Date: 10/10/2023 CLINICAL DATA:  Chest pain blurry vision EXAM: CT ANGIOGRAPHY CHEST, ABDOMEN AND PELVIS TECHNIQUE: Non-contrast CT of the chest was initially obtained. Multidetector CT imaging through the chest, abdomen and pelvis was performed using the standard protocol during bolus administration of intravenous contrast. Multiplanar reconstructed images and MIPs were obtained and reviewed to evaluate the vascular anatomy. RADIATION DOSE REDUCTION: This exam was performed according to the departmental dose-optimization program which includes automated exposure control, adjustment of the mA and/or kV according to patient size and/or use of iterative reconstruction technique. CONTRAST:  80mL  OMNIPAQUE IOHEXOL 350 MG/ML SOLN COMPARISON:  CT 06/07/2023, 11/15/2022 FINDINGS: CTA CHEST FINDINGS Cardiovascular: Non contrasted images of the chest demonstrate no acute intramural hematoma. Moderate severe aortic atherosclerosis. No aneurysm or dissection. Common origin of the right brachiocephalic artery and left common carotid artery. Mild stenosis of the left subclavian artery about a cm distal to the origin. Normal cardiac size. No pericardial effusion Mediastinum/Nodes: Patent trachea. No thyroid mass. No suspicious lymph nodes. Esophagus within normal limits. Lungs/Pleura: No acute airspace disease, pleural effusion or pneumothorax. Apical scarring. Musculoskeletal: Sternum is intact.  No acute osseous abnormality. Review of the MIP images confirms the above findings. CTA ABDOMEN AND PELVIS FINDINGS VASCULAR Aorta: Normal caliber aorta without aneurysm, dissection, vasculitis or significant stenosis. Moderate aortic atherosclerosis. 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 Distal vascular patency. No dissection or aneurysm SMA: Patent without evidence of aneurysm, dissection, vasculitis or significant stenosis. Renals: Heavily calcified at the origin. Possible mild stenosis at the right renal origin and moderate stenosis at the left renal artery origin. IMA: Suspect moderate severe stenosis at the origin of the IMA. No aneurysm. Distal patency. Inflow: Patent without evidence of aneurysm, dissection, vasculitis or significant stenosis. Veins: Suboptimally assessed Review of the MIP images confirms the above findings. NON-VASCULAR Hepatobiliary: No focal liver abnormality is seen. Status post cholecystectomy. No biliary dilatation. Pancreas: Unremarkable. No pancreatic ductal dilatation or surrounding inflammatory changes. Spleen: Normal in size without focal abnormality. Adrenals/Urinary Tract: Adrenal glands are within normal limits. Kidneys show no hydronephrosis. 3 mm  nonobstructing stone lower pole right kidney. Bladder is unremarkable. Stomach/Bowel: Stomach nonenlarged. No dilated small bowel. No acute bowel wall thickening. Postsurgical changes at the sigmoid colon. Lymphatic: No suspicious lymph nodes Reproductive: Hysterectomy.  No adnexal mass Other: Negative for pelvic effusion or free air. Fat containing supraumbilical ventral hernia. Musculoskeletal: Degenerative changes.  No acute osseous abnormality Review of the MIP images confirms the above findings. IMPRESSION: 1. Negative for acute aortic dissection or aneurysm. 2. Moderate severe aortic atherosclerosis. Suspected moderate severe stenosis at the origin of the celiac artery and IMA. Possible mild stenosis at the right renal origin and moderate stenosis at the left renal artery origin. 3. 3 mm nonobstructing stone lower pole right kidney. 4. Probable fat containing supraumbilical ventral hernia. 5. Aortic atherosclerosis. Electronically Signed  By: Jasmine Pang M.D.   On: 10/10/2023 00:04   DG Chest 2 View Result Date: 10/09/2023 CLINICAL DATA:  Chest pain. EXAM: CHEST - 2 VIEW COMPARISON:  Radiograph and CT 06/06/2023 FINDINGS: The cardiomediastinal contours are normal. Biapical pleuroparenchymal scarring. Pulmonary vasculature is normal. No consolidation, pleural effusion, or pneumothorax. No acute osseous abnormalities are seen. IMPRESSION: No active cardiopulmonary disease. Electronically Signed   By: Narda Rutherford M.D.   On: 10/09/2023 22:23   Scheduled Meds:  [MAR Hold] amitriptyline  25 mg Oral QHS   [MAR Hold] anastrozole  1 mg Oral QHS   [MAR Hold] aspirin EC  81 mg Oral Daily   [MAR Hold] budesonide  0.5 mg Nebulization BID   [MAR Hold] dorzolamide-timolol  1 drop Right Eye BID   [MAR Hold] insulin aspart  0-5 Units Subcutaneous QHS   [MAR Hold] insulin aspart  0-6 Units Subcutaneous TID WC   [MAR Hold] isosorbide mononitrate  60 mg Oral Daily   [MAR Hold] latanoprost  1 drop Left Eye QHS    [MAR Hold] metoprolol tartrate  12.5 mg Oral BID   [MAR Hold] pantoprazole  40 mg Oral Daily   [MAR Hold] sodium chloride flush  3 mL Intravenous Q12H   [MAR Hold] sodium chloride flush  3 mL Intravenous Q12H   [MAR Hold] cyanocobalamin  100 mcg Oral Daily   Continuous Infusions:  sodium chloride 10 mL/hr at 10/11/23 0932   heparin 850 Units/hr (10/11/23 0637)     LOS: 1 day    Shon Hale M.D on 10/11/2023 at 1:01 PM  Go to [email or web address] - for contact info  [facility] - Office  [phone]  If 7PM-7AM, please contact night-coverage [email or web address] 10/11/2023, 1:01 PM

## 2023-10-11 NOTE — Progress Notes (Signed)
 HARMACY - ANTICOAGULATION CONSULT NOTE  Pharmacy Consult for Heparin Indication: chest pain/ACS  Allergies  Allergen Reactions   Alphagan [Brimonidine] Itching   Diflunisal Swelling    Other reaction(s): ENTIRE BODY SWELLING   Vioxx [Rofecoxib] Shortness Of Breath   Metformin And Related     Kidney failure   Nexlizet [Bempedoic Acid-Ezetimibe]     Causes elevated Liver and Kidney function   Repatha [Evolocumab]     MYALGIAS   Codeine Rash   Elemental Sulfur Rash   Motrin [Ibuprofen] Rash   Penicillins Rash   Pravastatin Rash    Patient Measurements: Height: 5\' 2"  (157.5 cm) Weight: 70.3 kg (155 lb) IBW/kg (Calculated) : 50.1 HEPARIN DW (KG): 64.9  Vital Signs: Temp: 97.5 F (36.4 C) (04/08 0425) Temp Source: Oral (04/08 0425) BP: 147/66 (04/08 0425) Pulse Rate: 69 (04/08 0425)  Labs: Recent Labs    10/09/23 2152 10/09/23 2243 10/10/23 0819 10/10/23 0952 10/10/23 1800 10/11/23 0452  HGB 13.2  --   --   --   --  11.6*  HCT 42.3  --   --   --   --  36.6  PLT 223  --   --   --   --  178  LABPROT  --  20.2* 22.5*  --   --  21.4*  INR  --  1.7* 2.0*  --   --  1.8*  HEPARINUNFRC  --   --  0.47  --  0.40 0.37  CREATININE 1.55*  --   --  1.11*  --  1.04*  TROPONINIHS 99* 112*  --   --   --   --     Estimated Creatinine Clearance: 37.7 mL/min (A) (by C-G formula based on SCr of 1.04 mg/dL (H)).   Medical History: Past Medical History:  Diagnosis Date   Antral gastritis    EGD 11/15   Arthritis    Asthmatic bronchitis    Back pain    Breast cancer (HCC)    right breast   CAD in native artery 03/10/2021   Chronic diastolic heart failure (HCC) 05/20/2015   Grade 2 diastolic dysfunction.  04/2015.   Chronic kidney disease    kidney function low   COPD (chronic obstructive pulmonary disease) (HCC)    Diabetes mellitus    x 5 yrs   DVT of axillary vein, acute left (HCC) 07/24/2012   GERD (gastroesophageal reflux disease)    Glaucoma    POAG OU   Heart  murmur    rheum fever at age 36   History of hiatal hernia    History of kidney stones    Hyperlipidemia 05/20/2015   Hypertension    Hypertensive retinopathy    OU   Hypothyroidism    Kidney stones    Macular degeneration    Wet OD, Dry OS   Mixed hyperlipidemia    OSA (obstructive sleep apnea) 12/17/2021   does not use cpap on regular basis   Peripheral venous insufficiency    Pinched nerve    right elbow   Pneumonia    PONV (postoperative nausea and vomiting)    Sigmoid diverticulitis    Snoring 03/10/2021   Vertigo    chonic   Assessment: 83 y.o. female with chest pain for heparin. H/O DVT on Coumadin--INR 1.7 on admit.   Heparin level at goal x3 (0.37) on 850 units/hr. No bleeding issues noted. Hemoglobin slight trend down from 13.2 on admit to 11.6. INR bumped to 2.0 yesterday  but now down to 1.8 this morning. Possible cath planned.   Goal of Therapy:  Heparin level 0.3-0.7 units/ml Monitor platelets by anticoagulation protocol: Yes   Plan:  Continue heparin at 850 units/hr Check heparin level in AM if cath delayed  Thank you for involving pharmacy in this patient's care.   Sheppard Coil PharmD., BCPS Clinical Pharmacist 10/11/2023 8:13 AM

## 2023-10-12 ENCOUNTER — Other Ambulatory Visit (HOSPITAL_COMMUNITY): Payer: Self-pay

## 2023-10-12 ENCOUNTER — Ambulatory Visit: Attending: Cardiology

## 2023-10-12 ENCOUNTER — Other Ambulatory Visit (HOSPITAL_BASED_OUTPATIENT_CLINIC_OR_DEPARTMENT_OTHER): Payer: Self-pay | Admitting: Cardiovascular Disease

## 2023-10-12 ENCOUNTER — Inpatient Hospital Stay (HOSPITAL_COMMUNITY)

## 2023-10-12 DIAGNOSIS — I700 Atherosclerosis of aorta: Secondary | ICD-10-CM | POA: Diagnosis not present

## 2023-10-12 DIAGNOSIS — R079 Chest pain, unspecified: Secondary | ICD-10-CM | POA: Diagnosis not present

## 2023-10-12 DIAGNOSIS — I214 Non-ST elevation (NSTEMI) myocardial infarction: Secondary | ICD-10-CM | POA: Diagnosis not present

## 2023-10-12 DIAGNOSIS — R0789 Other chest pain: Secondary | ICD-10-CM

## 2023-10-12 DIAGNOSIS — N179 Acute kidney failure, unspecified: Secondary | ICD-10-CM

## 2023-10-12 LAB — ECHOCARDIOGRAM COMPLETE
Area-P 1/2: 3.5 cm2
Height: 62 in
S' Lateral: 2.9 cm
Weight: 2561.6 [oz_av]

## 2023-10-12 LAB — GLUCOSE, CAPILLARY: Glucose-Capillary: 104 mg/dL — ABNORMAL HIGH (ref 70–99)

## 2023-10-12 LAB — PROTIME-INR
INR: 1.5 — ABNORMAL HIGH (ref 0.8–1.2)
Prothrombin Time: 18.7 s — ABNORMAL HIGH (ref 11.4–15.2)

## 2023-10-12 MED ORDER — ASPIRIN 81 MG PO TBEC
81.0000 mg | DELAYED_RELEASE_TABLET | Freq: Every day | ORAL | 0 refills | Status: AC
  Filled 2023-10-12: qty 90, 90d supply, fill #0

## 2023-10-12 MED ORDER — IRBESARTAN 75 MG PO TABS
75.0000 mg | ORAL_TABLET | Freq: Every day | ORAL | Status: DC
  Administered 2023-10-12: 75 mg via ORAL
  Filled 2023-10-12: qty 1

## 2023-10-12 MED ORDER — PANTOPRAZOLE SODIUM 40 MG PO TBEC
40.0000 mg | DELAYED_RELEASE_TABLET | Freq: Every day | ORAL | 0 refills | Status: DC
  Filled 2023-10-12: qty 90, 90d supply, fill #0

## 2023-10-12 MED ORDER — PANTOPRAZOLE SODIUM 40 MG PO TBEC
40.0000 mg | DELAYED_RELEASE_TABLET | Freq: Every day | ORAL | Status: DC
Start: 2023-10-12 — End: 2023-10-12

## 2023-10-12 MED ORDER — ISOSORBIDE MONONITRATE ER 60 MG PO TB24: 120.0000 mg | ORAL_TABLET | Freq: Every day | ORAL | Status: DC

## 2023-10-12 MED ORDER — CARVEDILOL 6.25 MG PO TABS: 6.2500 mg | ORAL_TABLET | Freq: Two times a day (BID) | ORAL | Status: DC

## 2023-10-12 MED ORDER — ISOSORBIDE MONONITRATE ER 120 MG PO TB24
120.0000 mg | ORAL_TABLET | Freq: Every day | ORAL | 0 refills | Status: DC
  Filled 2023-10-12: qty 90, 90d supply, fill #0

## 2023-10-12 NOTE — Progress Notes (Signed)
  Echocardiogram 2D Echocardiogram has been performed.  Leda Roys RDCS 10/12/2023, 10:27 AM

## 2023-10-12 NOTE — Plan of Care (Signed)

## 2023-10-12 NOTE — Progress Notes (Addendum)
 Rounding Note    Patient Name: [name] Date of Encounter: 10/12/2023  [facility] Cardiologist: Chilton Si, MD   Subjective   Feeling well.  No more chest pain.    Inpatient Medications    Scheduled Meds:  amitriptyline  25 mg Oral QHS   anastrozole  1 mg Oral QHS   aspirin EC  81 mg Oral Daily   dorzolamide-timolol  1 drop Right Eye BID   insulin aspart  0-5 Units Subcutaneous QHS   insulin aspart  0-6 Units Subcutaneous TID WC   isosorbide mononitrate  60 mg Oral Daily   latanoprost  1 drop Left Eye QHS   metoprolol tartrate  12.5 mg Oral BID   pantoprazole  40 mg Oral Daily   sodium chloride flush  3 mL Intravenous Q12H   sodium chloride flush  3 mL Intravenous Q12H   sodium chloride flush  3 mL Intravenous Q12H   cyanocobalamin  100 mcg Oral Daily   Warfarin - Pharmacist Dosing Inpatient   Does not apply q1600   Continuous Infusions:  sodium chloride     PRN Meds: sodium chloride, acetaminophen **OR** acetaminophen, acetaminophen, albuterol, bisacodyl, ondansetron **OR** ondansetron (ZOFRAN) IV, mouth rinse, polyethylene glycol, polyvinyl alcohol, sodium chloride flush, sodium chloride flush, traZODone   Vital Signs    Vitals:   10/11/23 1555 10/11/23 1925 10/11/23 2032 10/12/23 0539  BP: 128/67 137/68  (!) 148/77  Pulse: 78 79  66  Resp: 14 15  16   Temp: 98.1 F (36.7 C) 98.2 F (36.8 C)  98.1 F (36.7 C)  TempSrc: Oral Oral  Oral  SpO2:  96% 96% 98%  Weight: 72.6 kg     Height: 5\' 2"  (1.575 m)      No intake or output data in the 24 hours ending 10/12/23 0924    10/11/2023    3:55 PM 10/09/2023    9:49 PM 10/04/2023    1:09 PM  Last 3 Weights  Weight (lbs) 160 lb 1.6 oz 155 lb 157 lb 12.8 oz  Weight (kg) 72.621 kg 70.308 kg 71.578 kg      Telemetry    Sinus rhythm.  PVCs.  - Personally Reviewed  ECG    N/a - Personally Reviewed  Physical Exam   VS:  BP (!) 148/77 (BP Location: Left Arm)   Pulse 66   Temp 98.1 F  (36.7 C) (Oral)   Resp 16   Ht 5\' 2"  (1.575 m)   Wt 72.6 kg   SpO2 98%   BMI 29.28 kg/m  , BMI Body mass index is 29.28 kg/m. GENERAL:  Well appearing HEENT: Pupils equal round and reactive, fundi not visualized, oral mucosa unremarkable NECK:  No jugular venous distention, waveform within normal limits, carotid upstroke brisk and symmetric, no bruits, no thyromegaly LUNGS:  Clear to auscultation bilaterally HEART:  RRR.  PMI not displaced or sustained,S1 and S2 within normal limits, no S3, no S4, no clicks, no rubs, no murmurs ABD:  Flat, positive bowel sounds normal in frequency in pitch, no bruits, no rebound, no guarding, no midline pulsatile mass, no hepatomegaly, no splenomegaly EXT:  2 plus pulses throughout, no edema, no cyanosis no clubbing SKIN:  No rashes no nodules NEURO:  Cranial nerves II through XII grossly intact, motor grossly intact throughout PSYCH:  Cognitively intact, oriented to person place and time   Labs    High Sensitivity Troponin:   Recent Labs  Lab 10/09/23 2152 10/09/23 2243  TROPONINIHS 99* 112*     Chemistry Recent Labs  Lab 10/09/23 2243 10/10/23 0952 10/11/23 0452 10/11/23 0908  NA  --  141 142 141  K  --  4.0 3.9 4.1  CL  --  108 109 106  CO2  --  26 25 27   GLUCOSE  --  111* 108* 111*  BUN  --  17 17 15   CREATININE  --  1.11* 1.04* 0.99  CALCIUM  --  9.1 8.9 9.2  PROT 6.0*  --   --   --   ALBUMIN 3.5  --   --   --   AST 14*  --   --   --   ALT 16  --   --   --   ALKPHOS 75  --   --   --   BILITOT 0.4  --   --   --   GFRNONAA  --  49* 53* 57*  ANIONGAP  --  7 8 8     Lipids  Recent Labs  Lab 10/11/23 0452  CHOL 178  TRIG 177*  HDL 37*  LDLCALC 106*  CHOLHDL 4.8    Hematology Recent Labs  Lab 10/09/23 2152 10/11/23 0452  WBC 7.9 4.5  RBC 4.42 3.89  HGB 13.2 11.6*  HCT 42.3 36.6  MCV 95.7 94.1  MCH 29.9 29.8  MCHC 31.2 31.7  RDW 12.7 12.5  PLT 223 178   Thyroid No results for input(s): "TSH", "FREET4" in the  last 168 hours.  BNPNo results for input(s): "BNP", "PROBNP" in the last 168 hours.  DDimer No results for input(s): "DDIMER" in the last 168 hours.   Radiology    CARDIAC CATHETERIZATION Result Date: 10/11/2023 Images from the original result were not included. Coronary angiography 10/11/2023: LM: Normal LAD: Prox 20% disease, distal 40% disease         Diag 1 ostial 70% stenosis, TIMI III flow, unlikely to be acute culprit Lcx: Mid 20% disease RCA: Prox 10% disease LVEDP 12 mmHg No culprit lesion identified to explain NSTEMI. Patient is currently on warfarin for DVT treatment. I do not think she needs DAPT. At the most, I would recommend adding one antiplatelet agent, either Aspirin 81 mg daily or Plavix 75 mg daily. Elder Negus, MD    Cardiac Studies   LHC 10/11/23: Coronary angiography 10/11/2023: LM: Normal LAD: Prox 20% disease, distal 40% disease         Diag 1 ostial 70% stenosis, TIMI III flow, unlikely to be acute culprit Lcx: Mid 20% disease RCA: Prox 10% disease   LVEDP 12 mmHg      No culprit lesion identified to explain NSTEMI. Patient is currently on warfarin for DVT treatment. I do not think she needs DAPT. At the most, I would recommend adding one antiplatelet agent, either Aspirin 81 mg daily or Plavix 75 mg daily.   Patient Profile     83 y.o. female with CAD, hypertension, HFpEF, HL, DM, carotid stenosis, and breast cancer admitted with NSTEMI.   Assessment & Plan    # NSTEMI:  HS-troponin elevated to 112.  She underwent LHC and had 70% OM lesion but otherwise non-obstructive disease.  Medical management recommended.  Will add aspirin.  Increase Imdur to 120 mg daily.  Refer to Cardiac rehab.  She reports uncontrolled GERD which may also be contributing.  Agree with adding and continuing PPI.  # Hyperlipidemia: She has not tolerated statins or Repatha.  Will arrange for  her to see the pharmacist to see what assistance program she may be able to qualify  for.  # HFpEF:  # Hypertension: Blood pressure not well-controlled.  She is euvolemic on exam.  Unclear why her home carvedilol was switched to metoprolol.  Will switch this back.  Resume home olmesartan.  Continue Farxiga.  # AKI: Resolved.  Resuming home ARB as above.  # DVT: H/o unprovoked DVT on warfarin chronically.  Resume warfarin and adding aspirin as above.   [facility] will sign off.   Medication Recommendations:  adding aspirin 81mg .  Resume home antihypertensives.  Other recommendations (labs, testing, etc):  lipid management.  Cardiac rehab.  BMP at follow up. Follow up as an outpatient:  we will arrange.  For questions or updates, please contact [facility] Please consult [email or web address] for contact info under        Signed, Chilton Si, MD  10/12/2023, 9:24 AM

## 2023-10-12 NOTE — Discharge Summary (Signed)
 Physician Discharge Summary  [name] ZOX:[id number] DOB: 01-Apr-1941 DOA: 10/09/2023  PCP: [name], MD  Admit date: 10/09/2023 Discharge date: 10/12/2023  Admitted From: Home Discharge disposition: Home  Recommendations at discharge:  Aspirin 81 mg daily has been added Continue Coumadin as before Resume Protonix. Continue carvedilol and olmesartan.  Imdur dose has been increased.  Hydralazine has been stopped Follow-up with cardiology as an outpatient  Brief narrative: [name] is a 83 y.o. female with PMH significant for DM2, HTN, HLD, CAD, CHF, prior DVT on warfarin, breast cancer s/p lumpectomy, radiation, carotid stenosis status post left CEA 2023 4/6, patient drove herself to ED at [facility] with complaint of chest pain radiating to the back, lightheadedness, blurry vision  On workup, she was found to have hypertension, AKI, elevated troponin. -CTA chest abdomen and pelvis negative for acute aortic dissection or aneurysm, moderate severe aortic stenosis noted, moderate severe stenosis at the origin of the celiac artery and IMA noted, mild stenosis at the right renal origin and moderate stenosis at the left renal artery origin noted   Patient was started on heparin drip Admitted to [facility] Seen by cardiology. Recommend for cardiac cath and hence transferred to [facility] For surgery, underwent cardiac cath.  Report as below  Feels good this morning.  Sitting up at the edge of the bed. Feels ready to go home. Cleared by cardiology for discharge today.  Hospital course: NSTEMI H/o CAD and prior stents Presented with chest pain.  Troponin was elevated.  Underwent LHC that showed 70% OM lesion but otherwise nonobstructive disease. Medical management was recommended.   At discharge to continue aspirin, Imdur Cardiac rehab recommended Recent Labs    10/09/23 2152 10/09/23 2243  TROPONINIHS 99* 112*   Chest pain Based on cardiac cath report, unlikely to be  cardiac in origin.  Patient states she has uncontrolled GERD which may have contributing. It seems she was not taking them lately.  Recommended to resume PPI.  HLD Did not tolerate statin or Repatha in the past Cardiology to arrange her to be seen by pharmacist to see what assistance program seemingly able to qualify for  Chronic diastolic CHF HTN Euvolemic.  Initially was hypotensive and required IV fluid.  Blood pressure better now Continue Coreg, olmesartan, Tradjenta as before.  Imdur dose has been increased by cardiology.  Stop hydralazine  AKI Present with creatinine elevated 1.55 likely due to hypotension.  Improved with hydration. Per cardiology, okay to resume ARB as before Recent Labs    11/15/22 1135 05/12/23 1231 06/06/23 1626 10/04/23 1415 10/09/23 2152 10/10/23 0952 10/11/23 0452 10/11/23 0908  BUN 15 15 13 16  26* 17 17 15   CREATININE 0.96 0.84 0.84 0.88 1.55* 1.11* 1.04* 0.99   Prior DVT Was apparently unprovoked.  Continue Coumadin.  Goals of care   Code Status: Full Code    Diet:  Diet Order             Diet - low sodium heart healthy           Diet Heart Room service appropriate? Yes; Fluid consistency: Thin  Diet effective now                   Nutritional status:  Body mass index is 29.28 kg/m.        Wounds:  -    Discharge Exam:   Vitals:   10/11/23 1555 10/11/23 1925 10/11/23 2032 10/12/23 0539  BP: 128/67 137/68  [name])  148/77  Pulse: 78 79  66  Resp: 14 15  16   Temp: 98.1 F (36.7 C) 98.2 F (36.8 C)  98.1 F (36.7 C)  TempSrc: Oral Oral  Oral  SpO2:  96% 96% 98%  Weight: 72.6 kg     Height: 5\' 2"  (1.575 m)       Body mass index is 29.28 kg/m.  General exam: Pleasant, elderly Caucasian female.  Not in distress Skin: No rashes, lesions or ulcers. HEENT: Atraumatic, normocephalic, no obvious bleeding Lungs: Clear to auscultation bilaterally,  CVS: S1, S2, no murmur,   GI/Abd: Soft, nontender, nondistended, bowel  sound present,   CNS: Alert, awake, oriented x 3 Psychiatry: Mood appropriate,  Extremities: No pedal edema, no calf tenderness,   Follow ups:    Follow-up Information     [name], MD Follow up.   Specialty: Family Medicine Contact information: [address] [place] [address] [phone]                 Discharge Instructions:   Discharge Instructions     AMB referral to Phase II Cardiac Rehabilitation   Complete by: As directed    Diagnosis: NSTEMI   After initial evaluation and assessments completed: Virtual Based Care may be provided alone or in conjunction with Phase 2 Cardiac Rehab based on patient barriers.: Yes   Intensive Cardiac Rehabilitation (ICR) [facility] location only OR Traditional Cardiac Rehabilitation (TCR) *If criteria for ICR are not met will enroll in TCR [facility] only): Yes   Call MD for:  difficulty breathing, headache or visual disturbances   Complete by: As directed    Call MD for:  extreme fatigue   Complete by: As directed    Call MD for:  hives   Complete by: As directed    Call MD for:  persistant dizziness or light-headedness   Complete by: As directed    Call MD for:  persistant nausea and vomiting   Complete by: As directed    Call MD for:  severe uncontrolled pain   Complete by: As directed    Call MD for:  temperature >100.4   Complete by: As directed    Diet - low sodium heart healthy   Complete by: As directed    Discharge instructions   Complete by: As directed    Recommendations at discharge:   Aspirin 81 mg daily has been added  Continue Coumadin as before  Resume Protonix.  Continue carvedilol and olmesartan.  Imdur dose has been increased.  Hydralazine has been stopped  Follow-up with cardiology as an outpatient  Discharge instructions for CHF Check weight daily -preferably same time every day. Restrict fluid intake to 1200 ml daily Restrict salt intake to less than 2 g daily. Call MD if you have one  of the following symptoms 1) 3 pound weight gain in 24 hours or 5 pounds in 1 week  2) swelling in the hands, feet or stomach  3) progressive shortness of breath 4) if you have to sleep on extra pillows at night in order to breathe       General discharge instructions: Follow with Primary MD [name], MD in 7 days  Please request your PCP  to go over your hospital tests, procedures, radiology results at the follow up. Please get your medicines reviewed and adjusted.  Your PCP may decide to repeat certain labs or tests as needed. Do not drive, operate heavy machinery, perform activities at heights, swimming or participation in  water activities or provide baby sitting services if your were admitted for syncope or siezures until you have seen by Primary MD or a Neurologist and advised to do so again. [place] Controlled Substance Reporting System database was reviewed. Do not drive, operate heavy machinery, perform activities at heights, swim, participate in water activities or provide baby-sitting services while on medications for pain, sleep and mood until your outpatient physician has reevaluated you and advised to do so again.  You are strongly recommended to comply with the dose, frequency and duration of prescribed medications. Activity: As tolerated with Full fall precautions use walker/cane & assistance as needed Avoid using any recreational substances like cigarette, tobacco, alcohol, or non-prescribed drug. If you experience worsening of your admission symptoms, develop shortness of breath, life threatening emergency, suicidal or homicidal thoughts you must seek medical attention immediately by calling 911 or calling your MD immediately  if symptoms less severe. You must read complete instructions/literature along with all the possible adverse reactions/side effects for all the medicines you take and that have been prescribed to you. Take any new medicine only after you have  completely understood and accepted all the possible adverse reactions/side effects.  Wear Seat belts while driving. You were cared for by a hospitalist during your hospital stay. If you have any questions about your discharge medications or the care you received while you were in the hospital after you are discharged, you can call the unit and ask to speak with the hospitalist or the covering physician. Once you are discharged, your primary care physician will handle any further medical issues. Please note that NO REFILLS for any discharge medications will be authorized once you are discharged, as it is imperative that you return to your primary care physician (or establish a relationship with a primary care physician if you do not have one).   Increase activity slowly   Complete by: As directed        Discharge Medications:   Allergies as of 10/12/2023       Reactions   Alphagan [brimonidine] Itching   Diflunisal Swelling   Other reaction(s): ENTIRE BODY SWELLING   Vioxx [rofecoxib] Shortness Of Breath   Metformin And Related    Kidney failure   Nexlizet [bempedoic Acid-ezetimibe]    Causes elevated Liver and Kidney function   Repatha [evolocumab]    MYALGIAS   Codeine Rash   Elemental Sulfur Rash   Motrin [ibuprofen] Rash   Penicillins Rash   Pravastatin Rash        Medication List     STOP taking these medications    esomeprazole 20 MG capsule Commonly known as: NEXIUM Replaced by: pantoprazole 40 MG tablet   hydrALAZINE 25 MG tablet Commonly known as: APRESOLINE       TAKE these medications    albuterol (2.5 MG/3ML) 0.083% nebulizer solution Commonly known as: PROVENTIL Take 2.5 mg by nebulization every 6 (six) hours as needed for wheezing or shortness of breath.   albuterol 108 (90 Base) MCG/ACT inhaler Commonly known as: VENTOLIN HFA Inhale 2 puffs into the lungs every 4 (four) hours as needed for shortness of breath.   amitriptyline 25 MG  tablet Commonly known as: ELAVIL Take 25 mg by mouth at bedtime.   anastrozole 1 MG tablet Commonly known as: ARIMIDEX Take 1 tablet (1 mg total) by mouth at bedtime.   aspirin EC 81 MG tablet Take 1 tablet (81 mg total) by mouth daily. Swallow whole. Start taking on: October 13, 2023   carvedilol 6.25 MG tablet Commonly known as: COREG TAKE ONE TABLET BY MOUTH TWICE DAILY What changed: additional instructions   dorzolamide-timolol 2-0.5 % ophthalmic solution Commonly known as: COSOPT INSTILL 1 DROP INTO RIGHT EYE TWICE A DAY What changed: See the new instructions.   Farxiga 5 MG Tabs tablet Generic drug: dapagliflozin propanediol Take 5 mg by mouth every morning.   Fish Oil 1000 MG Caps Take 1,000 mg by mouth 2 (two) times daily. 9am & 9pm   gabapentin 300 MG capsule Commonly known as: NEURONTIN Take 300 mg by mouth 3 (three) times daily. 9am, 1pm & 9pm   isosorbide mononitrate 120 MG 24 hr tablet Commonly known as: IMDUR Take 1 tablet (120 mg total) by mouth daily. Start taking on: October 13, 2023 What changed:  medication strength how much to take Another medication with the same name was removed. Continue taking this medication, and follow the directions you see here.   latanoprost 0.005 % ophthalmic solution Commonly known as: XALATAN Place 1 drop into the left eye in the morning and at bedtime.   linagliptin 5 MG Tabs tablet Commonly known as: TRADJENTA Take 5 mg by mouth at bedtime.   meclizine 25 MG tablet Commonly known as: ANTIVERT Take 25 mg by mouth every 6 (six) hours as needed for dizziness.   NON FORMULARY Take 1 each by mouth at bedtime. CBD Gummy   olmesartan 40 MG tablet Commonly known as: BENICAR Take 40 mg by mouth daily.   OneTouch Delica Plus Lancet30G Misc at bedtime.   OneTouch Ultra test strip Generic drug: glucose blood 1 each by Other route at bedtime.   pantoprazole 40 MG tablet Commonly known as: PROTONIX Take 1 tablet  (40 mg total) by mouth daily. Start taking on: October 13, 2023 Replaces: esomeprazole 20 MG capsule   PRESERVISION AREDS 2 PO Take 1 capsule by mouth in the morning and at bedtime.   warfarin 2 MG tablet Commonly known as: COUMADIN Take as directed. If you are unsure how to take this medication, talk to your nurse or doctor. Original instructions: TAKE 1 TABLET BY MOUTH EVERY DAY AT BEDTIME OR AS DIRECTED BY THE COUMADIN CLINIC         The results of significant diagnostics from this hospitalization (including imaging, microbiology, ancillary and laboratory) are listed below for reference.    Procedures and Diagnostic Studies:   CT Angio Chest/Abd/Pel for Dissection W and/or Wo Contrast Result Date: 10/10/2023 CLINICAL DATA:  Chest pain blurry vision EXAM: CT ANGIOGRAPHY CHEST, ABDOMEN AND PELVIS TECHNIQUE: Non-contrast CT of the chest was initially obtained. Multidetector CT imaging through the chest, abdomen and pelvis was performed using the standard protocol during bolus administration of intravenous contrast. Multiplanar reconstructed images and MIPs were obtained and reviewed to evaluate the vascular anatomy. RADIATION DOSE REDUCTION: This exam was performed according to the departmental dose-optimization program which includes automated exposure control, adjustment of the mA and/or kV according to patient size and/or use of iterative reconstruction technique. CONTRAST:  80mL OMNIPAQUE IOHEXOL 350 MG/ML SOLN COMPARISON:  CT 06/07/2023, 11/15/2022 FINDINGS: CTA CHEST FINDINGS Cardiovascular: Non contrasted images of the chest demonstrate no acute intramural hematoma. Moderate severe aortic atherosclerosis. No aneurysm or dissection. Common origin of the right brachiocephalic artery and left common carotid artery. Mild stenosis of the left subclavian artery about a cm distal to the origin. Normal cardiac size. No pericardial effusion Mediastinum/Nodes: Patent trachea. No thyroid mass. No  suspicious lymph nodes. Esophagus  within normal limits. Lungs/Pleura: No acute airspace disease, pleural effusion or pneumothorax. Apical scarring. Musculoskeletal: Sternum is intact.  No acute osseous abnormality. Review of the MIP images confirms the above findings. CTA ABDOMEN AND PELVIS FINDINGS VASCULAR Aorta: Normal caliber aorta without aneurysm, dissection, vasculitis or significant stenosis. Moderate aortic atherosclerosis. 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 Distal vascular patency. No dissection or aneurysm SMA: Patent without evidence of aneurysm, dissection, vasculitis or significant stenosis. Renals: Heavily calcified at the origin. Possible mild stenosis at the right renal origin and moderate stenosis at the left renal artery origin. IMA: Suspect moderate severe stenosis at the origin of the IMA. No aneurysm. Distal patency. Inflow: Patent without evidence of aneurysm, dissection, vasculitis or significant stenosis. Veins: Suboptimally assessed Review of the MIP images confirms the above findings. NON-VASCULAR Hepatobiliary: No focal liver abnormality is seen. Status post cholecystectomy. No biliary dilatation. Pancreas: Unremarkable. No pancreatic ductal dilatation or surrounding inflammatory changes. Spleen: Normal in size without focal abnormality. Adrenals/Urinary Tract: Adrenal glands are within normal limits. Kidneys show no hydronephrosis. 3 mm nonobstructing stone lower pole right kidney. Bladder is unremarkable. Stomach/Bowel: Stomach nonenlarged. No dilated small bowel. No acute bowel wall thickening. Postsurgical changes at the sigmoid colon. Lymphatic: No suspicious lymph nodes Reproductive: Hysterectomy.  No adnexal mass Other: Negative for pelvic effusion or free air. Fat containing supraumbilical ventral hernia. Musculoskeletal: Degenerative changes.  No acute osseous abnormality Review of the MIP images confirms the above findings. IMPRESSION: 1.  Negative for acute aortic dissection or aneurysm. 2. Moderate severe aortic atherosclerosis. Suspected moderate severe stenosis at the origin of the celiac artery and IMA. Possible mild stenosis at the right renal origin and moderate stenosis at the left renal artery origin. 3. 3 mm nonobstructing stone lower pole right kidney. 4. Probable fat containing supraumbilical ventral hernia. 5. Aortic atherosclerosis. Electronically Signed   By: Jasmine Pang M.D.   On: 10/10/2023 00:04   DG Chest 2 View Result Date: 10/09/2023 CLINICAL DATA:  Chest pain. EXAM: CHEST - 2 VIEW COMPARISON:  Radiograph and CT 06/06/2023 FINDINGS: The cardiomediastinal contours are normal. Biapical pleuroparenchymal scarring. Pulmonary vasculature is normal. No consolidation, pleural effusion, or pneumothorax. No acute osseous abnormalities are seen. IMPRESSION: No active cardiopulmonary disease. Electronically Signed   By: Narda Rutherford M.D.   On: 10/09/2023 22:23     Labs:   Basic Metabolic Panel: Recent Labs  Lab 10/09/23 2152 10/10/23 0952 10/11/23 0452 10/11/23 0908  NA 136 141 142 141  K 3.9 4.0 3.9 4.1  CL 103 108 109 106  CO2 22 26 25 27   GLUCOSE 145* 111* 108* 111*  BUN 26* 17 17 15   CREATININE 1.55* 1.11* 1.04* 0.99  CALCIUM 9.5 9.1 8.9 9.2   GFR Estimated Creatinine Clearance: 40.2 mL/min (by C-G formula based on SCr of 0.99 mg/dL). Liver Function Tests: Recent Labs  Lab 10/09/23 2243  AST 14*  ALT 16  ALKPHOS 75  BILITOT 0.4  PROT 6.0*  ALBUMIN 3.5   Recent Labs  Lab 10/09/23 2243  LIPASE 43   No results for input(s): "AMMONIA" in the last 168 hours. Coagulation profile Recent Labs  Lab 10/09/23 2243 10/10/23 0819 10/11/23 0452 10/12/23 0413  INR 1.7* 2.0* 1.8* 1.5*    CBC: Recent Labs  Lab 10/09/23 2152 10/11/23 0452  WBC 7.9 4.5  HGB 13.2 11.6*  HCT 42.3 36.6  MCV 95.7 94.1  PLT 223 178   Cardiac Enzymes: No results for input(s): "  CKTOTAL", "CKMB", "CKMBINDEX",  "TROPONINI" in the last 168 hours. BNP: Invalid input(s): "POCBNP" CBG: Recent Labs  Lab 10/10/23 2022 10/11/23 0731 10/11/23 1619 10/11/23 2159 10/12/23 0803  GLUCAP 140* 102* 96 136* 104*   D-Dimer No results for input(s): "DDIMER" in the last 72 hours. Hgb A1c Recent Labs    10/10/23 0952  HGBA1C 7.0*   Lipid Profile Recent Labs    10/11/23 0452  CHOL 178  HDL 37*  LDLCALC 106*  TRIG 177*  CHOLHDL 4.8   Thyroid function studies No results for input(s): "TSH", "T4TOTAL", "T3FREE", "THYROIDAB" in the last 72 hours.  Invalid input(s): "FREET3" Anemia work up No results for input(s): "VITAMINB12", "FOLATE", "FERRITIN", "TIBC", "IRON", "RETICCTPCT" in the last 72 hours. Microbiology No results found for this or any previous visit (from the past 240 hours).  Time coordinating discharge: 45 minutes  Signed: Dian Laprade  [facility] 10/12/2023, 11:14 AM

## 2023-10-13 LAB — LIPOPROTEIN A (LPA): Lipoprotein (a): <8.4 nmol/L (ref <75.0)

## 2023-10-20 ENCOUNTER — Ambulatory Visit (HOSPITAL_COMMUNITY)

## 2023-10-20 ENCOUNTER — Ambulatory Visit: Attending: Cardiology | Admitting: *Deleted

## 2023-10-20 DIAGNOSIS — Z5181 Encounter for therapeutic drug level monitoring: Secondary | ICD-10-CM | POA: Diagnosis not present

## 2023-10-20 DIAGNOSIS — I82A12 Acute embolism and thrombosis of left axillary vein: Secondary | ICD-10-CM

## 2023-10-20 LAB — POCT INR: INR: 1.2 — AB (ref 2.0–3.0)

## 2023-10-20 NOTE — Patient Instructions (Signed)
 Increase warfarin to 1 tablet daily except 2 tablets on Sundays and Thursdays Recheck INR in 1 wk Pt states medications have been wrong since [facility] change to Exact care. Pt states she has been putting warfarin in pill box.  Told her to bring warfarin pills and pill box to next INR appt. Call Coumadin clinic for any questions or changes in medications.

## 2023-10-24 ENCOUNTER — Encounter (HOSPITAL_BASED_OUTPATIENT_CLINIC_OR_DEPARTMENT_OTHER): Payer: Self-pay | Admitting: Family

## 2023-10-24 ENCOUNTER — Ambulatory Visit (HOSPITAL_BASED_OUTPATIENT_CLINIC_OR_DEPARTMENT_OTHER): Admitting: Family

## 2023-10-24 VITALS — BP 156/80 | HR 72 | Ht 62.0 in | Wt 153.0 lb

## 2023-10-24 DIAGNOSIS — I25118 Atherosclerotic heart disease of native coronary artery with other forms of angina pectoris: Secondary | ICD-10-CM

## 2023-10-24 DIAGNOSIS — E785 Hyperlipidemia, unspecified: Secondary | ICD-10-CM

## 2023-10-24 DIAGNOSIS — I10 Essential (primary) hypertension: Secondary | ICD-10-CM | POA: Diagnosis not present

## 2023-10-24 DIAGNOSIS — D6859 Other primary thrombophilia: Secondary | ICD-10-CM

## 2023-10-24 DIAGNOSIS — I5032 Chronic diastolic (congestive) heart failure: Secondary | ICD-10-CM

## 2023-10-24 MED ORDER — ISOSORBIDE MONONITRATE ER 120 MG PO TB24: 120.0000 mg | ORAL_TABLET | Freq: Every day | ORAL | 1 refills | Status: DC

## 2023-10-24 MED ORDER — CARVEDILOL 12.5 MG PO TABS: 12.5000 mg | ORAL_TABLET | Freq: Two times a day (BID) | ORAL | 1 refills | Status: DC

## 2023-10-24 NOTE — Progress Notes (Signed)
 Cardiology Office Note:  .   Date:  10/24/2023  ID:  [name], DOB 1940/11/04, MRN [id number] PCP: Minus Amel, MD  [facility] Providers Cardiologist:  [name], MD    History of Present Illness: .   [name] is a 83 y.o. female with history of CAD (50% LAD stenosis), chronic diastolic heart failure (grade 2), hypertension, hyperlipidemia, prior DVT on warfarin, breast cancer s/p lumpectomy and XRT, DM2, carotid stenosis s/p left carotid endarterectomy 03/2022, RBBB.  Myoview  04/2015 for chest pain LVEF 70% with small defect of moderate severity mid anterior and apical anterior region.  Felt to be due to breast attenuation artifact.  Underwent LHC 05/26/2015 with 50% LAD lesion.  There was concern for component of vasospasm and long-acting nitrates were started.  BP and lipid agents have been titrated due to poor control.  On Lasix  due to lower extremity edema.  Sleep study 03/2021 with mild sleep apnea.  She was recommended for oral airway device or CPAP.  Seen 02/2022 and CTA confirmed severe stenosis of left common carotid artery and ICA.  Underwent carotid endarterectomy 03/24/2022.  Admitted 4/6-10/12/23 with NSTEMI. LHC with 70% OM lesion but otherwise nonobstructive disease recommended for medical managemnet. Aspirin , Imdur  120mg  daily continued at discharge.   Presents today for follow up independently. Enjoys spending time with her new great granddaughter who was just born 10/12/23. Since discharge endorses feeling weak and has been making position changes slowly due to feeling "spinny headed". Has BP cuff at home but has misplaced. Reports "a little bit" of chest pain which was at rest and self resolved. Reviewed LHC in detail and reassurance provided. .   ROS: Please see the history of present illness.    All other systems reviewed and are negative.   Studies Reviewed: .           Risk Assessment/Calculations:     HYPERTENSION CONTROL Vitals:   10/24/23  1430 10/24/23 1445  BP: (!) 160/80 (!) 156/80    The patient's blood pressure is elevated above target today.  In order to address the patient's elevated BP: A current anti-hypertensive medication was adjusted today.          Physical Exam:   VS:  BP (!) 156/80   Pulse 72   Ht 5\' 2"  (1.575 m)   Wt 153 lb (69.4 kg)   BMI 27.98 kg/m    Wt Readings from Last 3 Encounters:  10/24/23 153 lb (69.4 kg)  10/11/23 160 lb 1.6 oz (72.6 kg)  10/04/23 157 lb 12.8 oz (71.6 kg)    GEN: Well nourished, well developed in no acute distress NECK: No JVD; No carotid bruits CARDIAC: RRR, no murmurs, rubs, gallops RESPIRATORY:  Clear to auscultation without rales, wheezing or rhonchi  ABDOMEN: Soft, non-tender, non-distended EXTREMITIES:  No edema; No deformity   ASSESSMENT AND PLAN: .    HTN-BP not at goal less than 130/80.  Increase carvedilol  to 12.5 mg twice daily.  Continue hydralazine  25 mg 3 times daily, Imdur  120 mg daily, olmesartan 40 mg daily. Discussed to monitor BP at home at least 2 hours after medications and sitting for 5-10 minutes.   Bifascicular heart block - stable finding of bifasicular finding by prior EKG . No lightheadedness, dizziness, no syncope.  Monitor with periodic EKG.  CAD/hyperlipidemia, LDL goal 70 - Nonobstructive disease by [facility] 05/2015 and 10/2023. GDMT includes Coreg  12.5 mg twice daily, Imdur  120 mg daily. Recommend aiming for 150 minutes  of moderate intensity activity per week and following a heart healthy diet.  Previously intolerant to Repatha , statin.  Tolerated Praluent  but became cost prohibitive Will route note to pharmacy team to see if [facility] can be resumed for Praluent .  Encouraged to participate in cardiac rehab.   Chronic diastolic heart failure - Euvolemic and well compensated on exam. GDMT includes  Farxiga  5 mg daily, Carvedilol  12.5 mg twice daily. Low sodium diet, fluid restriction <2L, and daily weights encouraged. Educated to contact  our office for weight gain of 2 lbs overnight or 5 lbs in one week.   Prior DVT / Hypercoagulable state - Continue warfarin. Denies bleeding complications.   OSA - CPAP compliance encouraged.   Carotid stenosis s/p left CEA - carotid duplex 10/2022 bilateral 1 -39% stenosis.  Repeat carotid duplex upcoming 11/2023.    Cardiac Rehabilitation Eligibility Assessment  The patient is ready to start cardiac rehabilitation from a cardiac standpoint.       Dispo: follow up in 6 months with Dr. Theodis Fiscal  Signed, Clearnce Curia, NP

## 2023-10-24 NOTE — Patient Instructions (Signed)
 Medication Instructions:  Your physician has recommended you make the following change in your medication:  Change Carvedilol  12.5mg  daily   Testing/Procedures: Carotid as scheduled   Follow-Up: At [facility], you and your health needs are our priority.  As part of our continuing mission to provide you with exceptional heart care, our providers are all part of one team.  This team includes your primary Cardiologist (physician) and Advanced Practice Providers or APPs (Physician Assistants and Nurse Practitioners) who all work together to provide you with the care you need, when you need it.  Follow up as scheduled with Dr. Theodis Fiscal   Other:  We will reach out to the [facility] team about [facility]

## 2023-10-25 ENCOUNTER — Ambulatory Visit: Attending: Cardiology | Admitting: *Deleted

## 2023-10-25 DIAGNOSIS — Z5181 Encounter for therapeutic drug level monitoring: Secondary | ICD-10-CM

## 2023-10-25 DIAGNOSIS — I82A12 Acute embolism and thrombosis of left axillary vein: Secondary | ICD-10-CM | POA: Diagnosis not present

## 2023-10-25 LAB — POCT INR: INR: 1.6 — AB (ref 2.0–3.0)

## 2023-10-25 NOTE — Patient Instructions (Signed)
 Increase warfarin to 1 tablet daily except 2 tablets on Sundays, Tuesdays and Thursdays Recheck INR in 1 wk Pt brought meds.  Verified she does have warfarin 2mg  tablet.  Put warfarin in pill box for patient.  . Call Coumadin  clinic for any questions or changes in medications.

## 2023-10-27 ENCOUNTER — Telehealth (HOSPITAL_BASED_OUTPATIENT_CLINIC_OR_DEPARTMENT_OTHER): Payer: Self-pay | Admitting: Family

## 2023-10-27 MED ORDER — PRALUENT 75 MG/ML ~~LOC~~ SOAJ: 75.0000 mg | SUBCUTANEOUS | 3 refills | Status: DC

## 2023-10-27 NOTE — Telephone Encounter (Signed)
 Rx to pharmacy- pharmacy currently closed- will call again later

## 2023-10-27 NOTE — Telephone Encounter (Signed)
 Called pharmacy and had to leave VM on secure line. Left details regarding information on grant.

## 2023-10-27 NOTE — Telephone Encounter (Signed)
 Discussed with pharmacy team. She has a present [employer] grant for Praluent  but has not utilized since 03/2023.    [employer] grant - hypercholesterolemia   ID [id number] BIN  [id number] PCN   [id number] GRP  [id number]   Norberta Beans good until 02/06/24       Will route to nursing team: Please place Rx for Praluent  75mg  daily to her preferred pharmacy Call pharmacy to ensure they are able to apply [employer] grant  Clearnce Curia, NP

## 2023-10-27 NOTE — Addendum Note (Signed)
 Addended by: Guss Legacy on: 10/27/2023 08:15 AM   Modules accepted: Orders

## 2023-10-28 ENCOUNTER — Encounter (HOSPITAL_COMMUNITY)
Admission: RE | Admit: 2023-10-28 | Discharge: 2023-10-28 | Disposition: A | Discharge status: Home / Self Care | Source: Ambulatory Visit | Attending: Cardiology | Admitting: Cardiology

## 2023-10-28 DIAGNOSIS — I214 Non-ST elevation (NSTEMI) myocardial infarction: Secondary | ICD-10-CM | POA: Insufficient documentation

## 2023-10-28 NOTE — Progress Notes (Signed)
 Virtual orientation visit completed for cardiac rehab with NSTEMI. On-site orientation visit scheduled for 10/31/23. Documentation 10:30

## 2023-10-31 ENCOUNTER — Encounter (HOSPITAL_COMMUNITY)
Admission: RE | Admit: 2023-10-31 | Discharge: 2023-10-31 | Disposition: A | Discharge status: Home / Self Care | Source: Ambulatory Visit | Attending: Cardiology | Admitting: Cardiology

## 2023-10-31 VITALS — Ht 62.0 in | Wt 160.3 lb

## 2023-10-31 DIAGNOSIS — I214 Non-ST elevation (NSTEMI) myocardial infarction: Secondary | ICD-10-CM

## 2023-10-31 NOTE — Progress Notes (Signed)
 Cardiac Individual Treatment Plan  Patient Details  Name: [name] MRN: [id number] Date of Birth: 1940-10-03 Referring Provider:   Flowsheet Row CARDIAC REHAB PHASE II ORIENTATION from 10/31/2023 in [facility] CARDIAC REHABILITATION  Referring Provider Fransico Ivy MD  [name] MD]       Initial Encounter Date:  Flowsheet Row CARDIAC REHAB PHASE II ORIENTATION from 10/31/2023 in [place] CARDIAC REHABILITATION  Date 10/31/23       Visit Diagnosis: NSTEMI (non-ST elevated myocardial infarction) [facility])  Patient's Home Medications on Admission:  Current Outpatient Medications:    albuterol  (PROVENTIL  HFA;VENTOLIN  HFA) 108 (90 BASE) MCG/ACT inhaler, Inhale 2 puffs into the lungs every 4 (four) hours as needed for shortness of breath., Disp: 1 Inhaler, Rfl: 0   albuterol  (PROVENTIL ) (2.5 MG/3ML) 0.083% nebulizer solution, Take 2.5 mg by nebulization every 6 (six) hours as needed for wheezing or shortness of breath., Disp: , Rfl:    amitriptyline  (ELAVIL ) 25 MG tablet, Take 25 mg by mouth at bedtime., Disp: , Rfl:    anastrozole  (ARIMIDEX ) 1 MG tablet, Take 1 tablet (1 mg total) by mouth at bedtime., Disp: 90 tablet, Rfl: 3   aspirin  EC 81 MG tablet, Take 1 tablet (81 mg total) by mouth daily. Swallow whole., Disp: 90 tablet, Rfl: 0   carvedilol  (COREG ) 12.5 MG tablet, Take 1 tablet (12.5 mg total) by mouth 2 (two) times daily., Disp: 180 tablet, Rfl: 1   dorzolamide -timolol  (COSOPT ) 22.3-6.8 MG/ML ophthalmic solution, INSTILL 1 DROP INTO RIGHT EYE TWICE A DAY (Patient taking differently: Place 1 drop into the right eye 2 (two) times daily.), Disp: 30 mL, Rfl: 2   FARXIGA  5 MG TABS tablet, Take 5 mg by mouth every morning., Disp: , Rfl:    gabapentin  (NEURONTIN ) 300 MG capsule, Take 300 mg by mouth 3 (three) times daily. 9am, 1pm & 9pm, Disp: , Rfl:    isosorbide  mononitrate (IMDUR ) 120 MG 24 hr tablet, Take 1 tablet (120 mg total) by mouth daily., Disp: 90 tablet, Rfl:  1   Lancets (ONETOUCH DELICA PLUS LANCET30G) MISC, at bedtime., Disp: , Rfl:    latanoprost  (XALATAN ) 0.005 % ophthalmic solution, Place 1 drop into the left eye in the morning and at bedtime., Disp: , Rfl:    linagliptin  (TRADJENTA ) 5 MG TABS tablet, Take 5 mg by mouth at bedtime., Disp: , Rfl:    meclizine  (ANTIVERT ) 25 MG tablet, Take 25 mg by mouth every 6 (six) hours as needed for dizziness., Disp: , Rfl:    Multiple Vitamins-Minerals (PRESERVISION AREDS 2 PO), Take 1 capsule by mouth in the morning and at bedtime., Disp: , Rfl:    NON FORMULARY, Take 1 each by mouth at bedtime. CBD Gummy, Disp: , Rfl:    olmesartan (BENICAR) 40 MG tablet, Take 40 mg by mouth daily., Disp: , Rfl:    Omega-3 Fatty Acids (FISH OIL) 1000 MG CAPS, Take 1,000 mg by mouth 2 (two) times daily. 9am & 9pm, Disp: , Rfl:    ONETOUCH ULTRA test strip, 1 each by Other route at bedtime., Disp: , Rfl:    pantoprazole  (PROTONIX ) 40 MG tablet, Take 1 tablet (40 mg total) by mouth daily., Disp: 90 tablet, Rfl: 0   warfarin (COUMADIN ) 2 MG tablet, TAKE 1 TABLET BY MOUTH EVERY DAY AT BEDTIME OR AS DIRECTED BY THE COUMADIN  CLINIC, Disp: 90 tablet, Rfl: 1   Alirocumab  (PRALUENT ) 75 MG/ML SOAJ, Inject 1 mL (75 mg total) into the skin every 14 (fourteen)  days. (Patient not taking: Reported on 10/28/2023), Disp: 6 mL, Rfl: 3   hydrALAZINE  (APRESOLINE ) 25 MG tablet, Take 25 mg by mouth 3 (three) times daily. (Patient not taking: Reported on 10/31/2023), Disp: , Rfl:   Past Medical History: Past Medical History:  Diagnosis Date   Antral gastritis    EGD 11/15   Arthritis    Asthmatic bronchitis    Back pain    Breast cancer (HCC)    right breast   CAD in native artery 03/10/2021   Chronic diastolic heart failure (HCC) 05/20/2015   Grade 2 diastolic dysfunction.  04/2015.   Chronic kidney disease    kidney function low   COPD (chronic obstructive pulmonary disease) (HCC)    Diabetes mellitus    x 5 yrs   DVT of axillary  vein, acute left (HCC) 07/24/2012   GERD (gastroesophageal reflux disease)    Glaucoma    POAG OU   Heart murmur    rheum fever at age 22   History of hiatal hernia    History of kidney stones    Hyperlipidemia 05/20/2015   Hypertension    Hypertensive retinopathy    OU   Hypothyroidism    Kidney stones    Macular degeneration    Wet OD, Dry OS   Mixed hyperlipidemia    OSA (obstructive sleep apnea) 12/17/2021   does not use cpap on regular basis   Peripheral venous insufficiency    Pinched nerve    right elbow   Pneumonia    PONV (postoperative nausea and vomiting)    Sigmoid diverticulitis    Snoring 03/10/2021   Vertigo    chonic    Tobacco Use: Social History   Tobacco Use  Smoking Status Former   Types: Cigarettes   Passive exposure: Never  Smokeless Tobacco Never  Tobacco Comments   Smoke 1-1 1/2 packs a day    Labs: Review Flowsheet  More data exists      Latest Ref Rng & Units 11/25/2020 08/20/2021 03/11/2022 10/10/2023 10/11/2023  Labs for ITP Cardiac and Pulmonary Rehab  Cholestrol 0 - 200 mg/dL - 161  096  - 045   LDL (calc) 0 - 99 mg/dL - 60  63  - 409   HDL-C >40 mg/dL - 49  44  - 37   Trlycerides <150 mg/dL - 811  914  - 782   Hemoglobin A1c 4.8 - 5.6 % 7.5  - - 7.0  -    Capillary Blood Glucose: Lab Results  Component Value Date   GLUCAP 104 (H) 10/12/2023   GLUCAP 136 (H) 10/11/2023   GLUCAP 96 10/11/2023   GLUCAP 102 (H) 10/11/2023   GLUCAP 140 (H) 10/10/2023     Exercise Target Goals: Exercise Program Goal: Individual exercise prescription set using results from initial 6 min walk test and THRR while considering  patient's activity barriers and safety.   Exercise Prescription Goal: Starting with aerobic activity 30 plus minutes a day, 3 days per week for initial exercise prescription. Provide home exercise prescription and guidelines that participant acknowledges understanding prior to discharge.  Activity Barriers & Risk  Stratification:  Activity Barriers & Cardiac Risk Stratification - 10/28/23 1257       Activity Barriers & Cardiac Risk Stratification   Activity Barriers Assistive Device;History of Falls;Back Problems;Shortness of Breath;Balance Concerns;Chest Pain/Angina    Cardiac Risk Stratification Moderate             6 Minute Walk:  6 Minute  Walk     Row Name 10/31/23 1202         6 Minute Walk   Phase Initial     Distance 720 feet     Walk Time 6 minutes     # of Rest Breaks 0     MPH 1.36     METS 1.1     RPE 12     Perceived Dyspnea  0     VO2 Peak 3.9     Symptoms No     Resting HR 60 bpm     Resting BP 150/64     Resting Oxygen Saturation  95 %     Exercise Oxygen Saturation  during 6 min walk 96 %     Max Ex. HR 92 bpm     Max Ex. BP 160/60     2 Minute Post BP 158/64              Oxygen Initial Assessment:   Oxygen Re-Evaluation:   Oxygen Discharge (Final Oxygen Re-Evaluation):   Initial Exercise Prescription:  Initial Exercise Prescription - 10/31/23 1200       Date of Initial Exercise RX and Referring Provider   Date 10/31/23    Referring Provider Fransico Ivy MD   [name] MD     NuStep   Level 3    SPM 50    Minutes 15    METs 2      REL-XR   Level 2    Speed 60    Minutes 15    METs 2      Prescription Details   Frequency (times per week) 3    Duration Progress to 30 minutes of continuous aerobic without signs/symptoms of physical distress      Intensity   THRR 40-80% of Max Heartrate 91-122    Ratings of Perceived Exertion 11-13    Perceived Dyspnea 0-4      Resistance Training   Training Prescription Yes    Weight 4    Reps 10-15             Perform Capillary Blood Glucose checks as needed.  Exercise Prescription Changes:   Exercise Prescription Changes     Row Name 10/31/23 1200             Response to Exercise   Blood Pressure (Admit) 150/64       Blood Pressure (Exercise) 160/60        Blood Pressure (Exit) 158/64       Heart Rate (Admit) 60 bpm       Heart Rate (Exercise) 92 bpm       Heart Rate (Exit) 84 bpm       Oxygen Saturation (Admit) 95 %       Oxygen Saturation (Exercise) 96 %       Oxygen Saturation (Exit) 96 %       Rating of Perceived Exertion (Exercise) 12       Perceived Dyspnea (Exercise) 0                Exercise Comments:   Exercise Goals and Review:   Exercise Goals Re-Evaluation :    Discharge Exercise Prescription (Final Exercise Prescription Changes):  Exercise Prescription Changes - 10/31/23 1200       Response to Exercise   Blood Pressure (Admit) 150/64    Blood Pressure (Exercise) 160/60    Blood Pressure (Exit) 158/64    Heart Rate (Admit) 60 bpm  Heart Rate (Exercise) 92 bpm    Heart Rate (Exit) 84 bpm    Oxygen Saturation (Admit) 95 %    Oxygen Saturation (Exercise) 96 %    Oxygen Saturation (Exit) 96 %    Rating of Perceived Exertion (Exercise) 12    Perceived Dyspnea (Exercise) 0             Nutrition:  Target Goals: Understanding of nutrition guidelines, daily intake of sodium 1500mg , cholesterol 200mg , calories 30% from fat and 7% or less from saturated fats, daily to have 5 or more servings of fruits and vegetables.  Biometrics:  Pre Biometrics - 10/31/23 1208       Pre Biometrics   Height 5\' 2"  (1.575 m)    Weight 72.7 kg    Waist Circumference 37 inches    Hip Circumference 40 inches    Waist to Hip Ratio 0.93 %    BMI (Calculated) 29.31    Grip Strength 16.3 kg              Nutrition Therapy Plan and Nutrition Goals:   Nutrition Assessments:  MEDIFICTS Score Key: >=70 Need to make dietary changes  40-70 Heart Healthy Diet <= 40 Therapeutic Level Cholesterol Diet  Flowsheet Row CARDIAC REHAB PHASE II ORIENTATION from 10/31/2023 in [facility] CARDIAC REHABILITATION  Picture Your Plate Total Score on Admission 34      Picture Your Plate Scores: <86 Unhealthy dietary pattern  with much room for improvement. 41-50 Dietary pattern unlikely to meet recommendations for good health and room for improvement. 51-60 More healthful dietary pattern, with some room for improvement.  >60 Healthy dietary pattern, although there may be some specific behaviors that could be improved.    Nutrition Goals Re-Evaluation:   Nutrition Goals Discharge (Final Nutrition Goals Re-Evaluation):   Psychosocial: Target Goals: Acknowledge presence or absence of significant depression and/or stress, maximize coping skills, provide positive support system. Participant is able to verbalize types and ability to use techniques and skills needed for reducing stress and depression.  Initial Review & Psychosocial Screening:  Initial Psych Review & Screening - 10/28/23 1349       Initial Review   Current issues with Current Psychotropic Meds      Family Dynamics   Good Support System? Yes      Barriers   Psychosocial barriers to participate in program The patient should benefit from training in stress management and relaxation.;There are no identifiable barriers or psychosocial needs.      Screening Interventions   Interventions Encouraged to exercise;To provide support and resources with identified psychosocial needs;Provide feedback about the scores to participant    Expected Outcomes Short Term goal: Utilizing psychosocial counselor, staff and physician to assist with identification of specific Stressors or current issues interfering with healing process. Setting desired goal for each stressor or current issue identified.;Long Term Goal: Stressors or current issues are controlled or eliminated.;Short Term goal: Identification and review with participant of any Quality of Life or Depression concerns found by scoring the questionnaire.;Long Term goal: The participant improves quality of Life and PHQ9 Scores as seen by post scores and/or verbalization of changes             Quality of  Life Scores:  Quality of Life - 10/31/23 1211       Quality of Life   Select Quality of Life      Quality of Life Scores   Health/Function Pre 22.37 %    Socioeconomic Pre 30 %  Psych/Spiritual Pre 30 %    Family Pre 18 %    GLOBAL Pre 24.92 %            Scores of 19 and below usually indicate a poorer quality of life in these areas.  A difference of  2-3 points is a clinically meaningful difference.  A difference of 2-3 points in the total score of the Quality of Life Index has been associated with significant improvement in overall quality of life, self-image, physical symptoms, and general health in studies assessing change in quality of life.  PHQ-9: Review Flowsheet       10/31/2023 09/17/2021  Depression screen PHQ 2/9  Decreased Interest 1 1  Down, Depressed, Hopeless 0 1  PHQ - 2 Score 1 2  Altered sleeping 1 0  Tired, decreased energy 2 1  Change in appetite 0 0  Feeling bad or failure about yourself  1 0  Trouble concentrating 1 0  Moving slowly or fidgety/restless 0 0  Suicidal thoughts 0 0  PHQ-9 Score 6 3   Interpretation of Total Score  Total Score Depression Severity:  1-4 = Minimal depression, 5-9 = Mild depression, 10-14 = Moderate depression, 15-19 = Moderately severe depression, 20-27 = Severe depression   Psychosocial Evaluation and Intervention:  Psychosocial Evaluation - 10/28/23 1350       Psychosocial Evaluation & Interventions   Interventions Stress management education;Relaxation education;Encouraged to exercise with the program and follow exercise prescription    Comments Patient was referred to CR with NSTEMI. She did not have an intervention. She has had a stent several years ago. She continues to have some chest pain which she reported to Neomi Banks, NP at her follow up appointment. The pain usually resolves with time but can happen at rest or activity. She is on Imdur . She thinks it is related to her history of breast cancer. She  lives a lone. She says her husband died 4 years ago and she is still adjusting to being alone but she is very active with her church and participates in a lot of activities with them. She says her son and granddaughter are her main family support. She has a new great grand daughter and she loves to spend time with her. She denies and depression, anxiety, or stressors but she was tearful today when ask about her chest pain. She says she does not want to be a burden to her children and does not like to admit she is having chest pain. Amtriptyline is on her medication list but she was not aware of what she is taking but does have neuropathy. She plans to bring a list for her orientation visit. Her goals for the program are to get stronger and to be able to get back to doing all the activities she was doing prior to her NSTEMI. She has no barriers identified to complete the program.    Expected Outcomes Short Term: Patient will start the program and attend consistently. Long Term: Patient will complete the program meeting personal goals.    Continue Psychosocial Services  Follow up required by staff             Psychosocial Re-Evaluation:   Psychosocial Discharge (Final Psychosocial Re-Evaluation):   Vocational Rehabilitation: Provide vocational rehab assistance to qualifying candidates.   Vocational Rehab Evaluation & Intervention:  Vocational Rehab - 10/28/23 1348       Initial Vocational Rehab Evaluation & Intervention   Assessment shows need for Vocational Rehabilitation No  Vocational Rehab Re-Evaulation   Comments Patient is retired.             Education: Education Goals: Education classes will be provided on a weekly basis, covering required topics. Participant will state understanding/return demonstration of topics presented.  Learning Barriers/Preferences:  Learning Barriers/Preferences - 10/28/23 1349       Learning Barriers/Preferences   Learning Barriers None     Learning Preferences Skilled Demonstration;Audio             Education Topics: Hypertension, Hypertension Reduction -Define heart disease and high blood pressure. Discus how high blood pressure affects the body and ways to reduce high blood pressure.   Exercise and Your Heart -Discuss why it is important to exercise, the FITT principles of exercise, normal and abnormal responses to exercise, and how to exercise safely.   Angina -Discuss definition of angina, causes of angina, treatment of angina, and how to decrease risk of having angina.   Cardiac Medications -Review what the following cardiac medications are used for, how they affect the body, and side effects that may occur when taking the medications.  Medications include Aspirin , Beta blockers, calcium channel blockers, ACE Inhibitors, angiotensin receptor blockers, diuretics, digoxin, and antihyperlipidemics.   Congestive Heart Failure -Discuss the definition of CHF, how to live with CHF, the signs and symptoms of CHF, and how keep track of weight and sodium intake.   Heart Disease and Intimacy -Discus the effect sexual activity has on the heart, how changes occur during intimacy as we age, and safety during sexual activity.   Smoking Cessation / COPD -Discuss different methods to quit smoking, the health benefits of quitting smoking, and the definition of COPD.   Nutrition I: Fats -Discuss the types of cholesterol, what cholesterol does to the heart, and how cholesterol levels can be controlled.   Nutrition II: Labels -Discuss the different components of food labels and how to read food label   Heart Parts/Heart Disease and PAD -Discuss the anatomy of the heart, the pathway of blood circulation through the heart, and these are affected by heart disease.   Stress I: Signs and Symptoms -Discuss the causes of stress, how stress may lead to anxiety and depression, and ways to limit stress.   Stress II:  Relaxation -Discuss different types of relaxation techniques to limit stress.   Warning Signs of Stroke / TIA -Discuss definition of a stroke, what the signs and symptoms are of a stroke, and how to identify when someone is having stroke.   Knowledge Questionnaire Score:   Core Components/Risk Factors/Patient Goals at Admission:  Personal Goals and Risk Factors at Admission - 10/28/23 1348       Core Components/Risk Factors/Patient Goals on Admission    Weight Management Weight Maintenance    Improve shortness of breath with ADL's Yes    Intervention Provide education, individualized exercise plan and daily activity instruction to help decrease symptoms of SOB with activities of daily living.    Expected Outcomes Short Term: Improve cardiorespiratory fitness to achieve a reduction of symptoms when performing ADLs;Long Term: Be able to perform more ADLs without symptoms or delay the onset of symptoms    Diabetes Yes    Intervention Provide education about signs/symptoms and action to take for hypo/hyperglycemia.;Provide education about proper nutrition, including hydration, and aerobic/resistive exercise prescription along with prescribed medications to achieve blood glucose in normal ranges: Fasting glucose 65-99 mg/dL    Expected Outcomes Short Term: Participant verbalizes understanding of the signs/symptoms and immediate  care of hyper/hypoglycemia, proper foot care and importance of medication, aerobic/resistive exercise and nutrition plan for blood glucose control.;Long Term: Attainment of HbA1C < 7%.    Heart Failure Yes    Intervention Provide a combined exercise and nutrition program that is supplemented with education, support and counseling about heart failure. Directed toward relieving symptoms such as shortness of breath, decreased exercise tolerance, and extremity edema.    Expected Outcomes Improve functional capacity of life;Short term: Attendance in program 2-3 days a week with  increased exercise capacity. Reported lower sodium intake. Reported increased fruit and vegetable intake. Reports medication compliance.;Short term: Daily weights obtained and reported for increase. Utilizing diuretic protocols set by physician.;Long term: Adoption of self-care skills and reduction of barriers for early signs and symptoms recognition and intervention leading to self-care maintenance.    Hypertension Yes    Intervention Provide education on lifestyle modifcations including regular physical activity/exercise, weight management, moderate sodium restriction and increased consumption of fresh fruit, vegetables, and low fat dairy, alcohol  moderation, and smoking cessation.;Monitor prescription use compliance.    Expected Outcomes Long Term: Maintenance of blood pressure at goal levels.;Short Term: Continued assessment and intervention until BP is < 140/59mm HG in hypertensive participants. < 130/66mm HG in hypertensive participants with diabetes, heart failure or chronic kidney disease.    Lipids Yes    Intervention Provide education and support for participant on nutrition & aerobic/resistive exercise along with prescribed medications to achieve LDL 70mg , HDL >40mg .    Expected Outcomes Short Term: Participant states understanding of desired cholesterol values and is compliant with medications prescribed. Participant is following exercise prescription and nutrition guidelines.;Long Term: Cholesterol controlled with medications as prescribed, with individualized exercise RX and with personalized nutrition plan. Value goals: LDL < 70mg , HDL > 40 mg.             Core Components/Risk Factors/Patient Goals Review:    Core Components/Risk Factors/Patient Goals at Discharge (Final Review):    ITP Comments:  ITP Comments     Row Name 10/28/23 1404           ITP Comments Virtual orientation visit completed for cardiac rehab with NSTEMI. On-site orientation visit scheduled for 10/31/23.  Documentation 10:30                Comments: Patient arrived for 1st visit/orientation/education at 1030. Patient was referred to CR by Dr. Patwardhan/Attending Priscille Brought due to NSTEMI. During orientation advised patient on arrival and appointment times what to wear, what to do before, during and after exercise. Reviewed attendance and class policy.  Pt is scheduled to return Cardiac Rehab on 11/07/23 at 1430. Pt was advised to come to class 15 minutes before class starts.  Discussed RPE/Dpysnea scales. Patient participated in warm up stretches. Patient was able to complete 6 minute walk test.  Telemetry:NSR. Patient was measured for the equipment. Discussed equipment safety with patient. Took patient pre-anthropometric measurements. Patient finished visit at 1200.

## 2023-10-31 NOTE — Patient Instructions (Signed)
 Patient Instructions  Patient Details  Name: [name] MRN: [id number] Date of Birth: 29-Dec-1940 Referring Provider:  Cody Das, MD  Below are your personal goals for exercise, nutrition, and risk factors. Our goal is to help you stay on track towards obtaining and maintaining these goals. We will be discussing your progress on these goals with you throughout the program.  Initial Exercise Prescription:  Initial Exercise Prescription - 10/31/23 1200       Date of Initial Exercise RX and Referring Provider   Date 10/31/23    Referring Provider Fransico Ivy MD   [name] MD     NuStep   Level 3    SPM 50    Minutes 15    METs 2      REL-XR   Level 2    Speed 60    Minutes 15    METs 2      Prescription Details   Frequency (times per week) 3    Duration Progress to 30 minutes of continuous aerobic without signs/symptoms of physical distress      Intensity   THRR 40-80% of Max Heartrate 91-122    Ratings of Perceived Exertion 11-13    Perceived Dyspnea 0-4      Resistance Training   Training Prescription Yes    Weight 4    Reps 10-15             Exercise Goals: Frequency: Be able to perform aerobic exercise two to three times per week in program working toward 2-5 days per week of home exercise.  Intensity: Work with a perceived exertion of 11 (fairly light) - 15 (hard) while following your exercise prescription.  We will make changes to your prescription with you as you progress through the program.   Duration: Be able to do 30 to 45 minutes of continuous aerobic exercise in addition to a 5 minute warm-up and a 5 minute cool-down routine.   Nutrition Goals: Your personal nutrition goals will be established when you do your nutrition analysis with the dietician.  The following are general nutrition guidelines to follow: Cholesterol < 200mg /day Sodium < 1500mg /day Fiber: Women over 50 yrs - 21 grams per day  Personal Goals:   Personal Goals and Risk Factors at Admission - 10/28/23 1348       Core Components/Risk Factors/Patient Goals on Admission    Weight Management Weight Maintenance    Improve shortness of breath with ADL's Yes    Intervention Provide education, individualized exercise plan and daily activity instruction to help decrease symptoms of SOB with activities of daily living.    Expected Outcomes Short Term: Improve cardiorespiratory fitness to achieve a reduction of symptoms when performing ADLs;Long Term: Be able to perform more ADLs without symptoms or delay the onset of symptoms    Diabetes Yes    Intervention Provide education about signs/symptoms and action to take for hypo/hyperglycemia.;Provide education about proper nutrition, including hydration, and aerobic/resistive exercise prescription along with prescribed medications to achieve blood glucose in normal ranges: Fasting glucose 65-99 mg/dL    Expected Outcomes Short Term: Participant verbalizes understanding of the signs/symptoms and immediate care of hyper/hypoglycemia, proper foot care and importance of medication, aerobic/resistive exercise and nutrition plan for blood glucose control.;Long Term: Attainment of HbA1C < 7%.    Heart Failure Yes    Intervention Provide a combined exercise and nutrition program that is supplemented with education, support and counseling about heart failure. Directed toward relieving symptoms such  as shortness of breath, decreased exercise tolerance, and extremity edema.    Expected Outcomes Improve functional capacity of life;Short term: Attendance in program 2-3 days a week with increased exercise capacity. Reported lower sodium intake. Reported increased fruit and vegetable intake. Reports medication compliance.;Short term: Daily weights obtained and reported for increase. Utilizing diuretic protocols set by physician.;Long term: Adoption of self-care skills and reduction of barriers for early signs and symptoms  recognition and intervention leading to self-care maintenance.    Hypertension Yes    Intervention Provide education on lifestyle modifcations including regular physical activity/exercise, weight management, moderate sodium restriction and increased consumption of fresh fruit, vegetables, and low fat dairy, alcohol  moderation, and smoking cessation.;Monitor prescription use compliance.    Expected Outcomes Long Term: Maintenance of blood pressure at goal levels.;Short Term: Continued assessment and intervention until BP is < 140/46mm HG in hypertensive participants. < 130/59mm HG in hypertensive participants with diabetes, heart failure or chronic kidney disease.    Lipids Yes    Intervention Provide education and support for participant on nutrition & aerobic/resistive exercise along with prescribed medications to achieve LDL 70mg , HDL >40mg .    Expected Outcomes Short Term: Participant states understanding of desired cholesterol values and is compliant with medications prescribed. Participant is following exercise prescription and nutrition guidelines.;Long Term: Cholesterol controlled with medications as prescribed, with individualized exercise RX and with personalized nutrition plan. Value goals: LDL < 70mg , HDL > 40 mg.             Tobacco Use Initial Evaluation: Social History   Tobacco Use  Smoking Status Former   Types: Cigarettes   Passive exposure: Never  Smokeless Tobacco Never  Tobacco Comments   Smoke 1-1 1/2 packs a day    Exercise Goals and Review:   Copy of goals given to participant.

## 2023-11-01 ENCOUNTER — Ambulatory Visit: Attending: Cardiology | Admitting: *Deleted

## 2023-11-01 DIAGNOSIS — Z5181 Encounter for therapeutic drug level monitoring: Secondary | ICD-10-CM | POA: Diagnosis not present

## 2023-11-01 DIAGNOSIS — I82A12 Acute embolism and thrombosis of left axillary vein: Secondary | ICD-10-CM | POA: Diagnosis not present

## 2023-11-01 LAB — POCT INR: INR: 1.6 — AB (ref 2.0–3.0)

## 2023-11-01 NOTE — Patient Instructions (Signed)
 Increase warfarin to 2 tablets daily except 1 tablet on Mondays and Thursdays Recheck INR in 1 wk Call Coumadin  clinic for any questions or changes in medications.

## 2023-11-02 ENCOUNTER — Encounter (HOSPITAL_COMMUNITY)

## 2023-11-02 LAB — HM DIABETES EYE EXAM

## 2023-11-04 ENCOUNTER — Encounter (HOSPITAL_COMMUNITY)

## 2023-11-07 ENCOUNTER — Encounter (HOSPITAL_COMMUNITY)

## 2023-11-07 ENCOUNTER — Telehealth (HOSPITAL_COMMUNITY): Payer: Self-pay

## 2023-11-08 ENCOUNTER — Encounter

## 2023-11-08 ENCOUNTER — Encounter (HOSPITAL_COMMUNITY)
Admission: RE | Admit: 2023-11-08 | Discharge: 2023-11-08 | Disposition: A | Discharge status: Home / Self Care | Source: Ambulatory Visit | Attending: Cardiovascular Disease | Admitting: Cardiovascular Disease

## 2023-11-08 DIAGNOSIS — I214 Non-ST elevation (NSTEMI) myocardial infarction: Secondary | ICD-10-CM | POA: Insufficient documentation

## 2023-11-08 DIAGNOSIS — I82A12 Acute embolism and thrombosis of left axillary vein: Secondary | ICD-10-CM | POA: Insufficient documentation

## 2023-11-08 DIAGNOSIS — Z5181 Encounter for therapeutic drug level monitoring: Secondary | ICD-10-CM | POA: Insufficient documentation

## 2023-11-08 LAB — GLUCOSE, CAPILLARY: Glucose-Capillary: 97 mg/dL (ref 70–99)

## 2023-11-08 NOTE — Progress Notes (Signed)
 Daily Session Note  Patient Details  Name: [name] MRN: [id number] Date of Birth: 04-Mar-1941 Referring Provider:   Flowsheet Row CARDIAC REHAB PHASE II ORIENTATION from 10/31/2023 in [facility] CARDIAC REHABILITATION  Referring Provider Fransico Ivy MD  [name] MD]       Encounter Date: 11/08/2023  Check In:  Session Check In - 11/08/23 1034       Check-In   Supervising physician immediately available to respond to emergencies See telemetry face sheet for immediately available MD    Location [facility]    Staff Present Clotilda Danish, BS, Exercise Physiologist;Ariba Lehnen Zoila Hines, MA, RCEP, CCRP, CCET;Brooke Booth, RN    Virtual Visit No    Medication changes reported     No    Fall or balance concerns reported    No    Warm-up and Cool-down Performed on first and last piece of equipment    Resistance Training Performed Yes    VAD Patient? No    PAD/SET Patient? No      Pain Assessment   Currently in Pain? No/denies             Capillary Blood Glucose: No results found for this or any previous visit (from the past 24 hours).    Social History   Tobacco Use  Smoking Status Former   Types: Cigarettes   Passive exposure: Never  Smokeless Tobacco Never  Tobacco Comments   Smoke 1-1 1/2 packs a day    Goals Met:  Exercise tolerated well No report of concerns or symptoms today Strength training completed today  Goals Unmet:  Not Applicable  Comments: First full day of exercise!  Patient was oriented to gym and equipment including functions, settings, policies, and procedures.  Patient's individual exercise prescription and treatment plan were reviewed.  All starting workloads were established based on the results of the 6 minute walk test done at initial orientation visit.  The plan for exercise progression was also introduced and progression will be customized based on patient's performance and goals.

## 2023-11-09 ENCOUNTER — Encounter (HOSPITAL_COMMUNITY)
Admission: RE | Admit: 2023-11-09 | Discharge: 2023-11-09 | Disposition: A | Discharge status: Home / Self Care | Source: Ambulatory Visit | Attending: Cardiovascular Disease

## 2023-11-09 DIAGNOSIS — I214 Non-ST elevation (NSTEMI) myocardial infarction: Secondary | ICD-10-CM

## 2023-11-09 LAB — GLUCOSE, CAPILLARY
Glucose-Capillary: 149 mg/dL — ABNORMAL HIGH (ref 70–99)
Glucose-Capillary: 82 mg/dL (ref 70–99)

## 2023-11-09 NOTE — Progress Notes (Signed)
 Daily Session Note  Patient Details  Name: [name] MRN: [id number] Date of Birth: Nov 24, 1940 Referring Provider:   Flowsheet Row CARDIAC REHAB PHASE II ORIENTATION from 10/31/2023 in [facility] CARDIAC REHABILITATION  Referring Provider Fransico Ivy MD  [name] MD]       Encounter Date: 11/09/2023  Check In:  Session Check In - 11/09/23 1400       Check-In   Supervising physician immediately available to respond to emergencies See telemetry face sheet for immediately available MD    Location [facility]    Staff Present Ronna Coho BSN, RN;Heather Toy Freund, [place], Exercise Physiologist;Brittany Annette Barters, BSN, RN, WTA-C    Virtual Visit No    Medication changes reported     No    Fall or balance concerns reported    No    Tobacco Cessation No Change    Warm-up and Cool-down Performed on first and last piece of equipment    Resistance Training Performed Yes    VAD Patient? No    PAD/SET Patient? No      Pain Assessment   Currently in Pain? No/denies    Pain Score 0-No pain    Multiple Pain Sites No             Capillary Blood Glucose: Results for orders placed or performed during the hospital encounter of 11/09/23 (from the past 24 hours)  Glucose, capillary     Status: Abnormal   Collection Time: 11/09/23  2:14 PM  Result Value Ref Range   Glucose-Capillary 149 (H) 70 - 99 mg/dL      Social History   Tobacco Use  Smoking Status Former   Types: Cigarettes   Passive exposure: Never  Smokeless Tobacco Never  Tobacco Comments   Smoke 1-1 1/2 packs a day    Goals Met:  Independence with exercise equipment Exercise tolerated well No report of concerns or symptoms today Strength training completed today  Goals Unmet:  Not Applicable  Comments: .Pt able to follow exercise prescription today without complaint.  Will continue to monitor for progression.

## 2023-11-11 ENCOUNTER — Encounter (HOSPITAL_COMMUNITY)
Admission: RE | Admit: 2023-11-11 | Discharge: 2023-11-11 | Disposition: A | Discharge status: Home / Self Care | Source: Ambulatory Visit | Attending: Cardiovascular Disease | Admitting: Cardiovascular Disease

## 2023-11-11 DIAGNOSIS — I214 Non-ST elevation (NSTEMI) myocardial infarction: Secondary | ICD-10-CM

## 2023-11-11 NOTE — Progress Notes (Signed)
 Daily Session Note  Patient Details  Name: CARIME KYLLONEN MRN: [id number] Date of Birth: [birth year] Referring Provider:   Flowsheet Row CARDIAC REHAB PHASE II ORIENTATION from 10/31/2023 in [facility] CARDIAC REHABILITATION  Referring Provider Fransico Ivy MD  [name] MD]       Encounter Date: 11/11/2023  Check In:  Session Check In - 11/11/23 1430       Check-In   Supervising physician immediately available to respond to emergencies See telemetry face sheet for immediately available MD    Location [facility]    Staff Present Jerrol Morelle, BSN, RN, WTA-C;Damarkus Balis, RN;Debra Lincoln Renshaw, RN, BSN    Virtual Visit No    Medication changes reported     No    Fall or balance concerns reported    No    Warm-up and Cool-down Performed on first and last piece of equipment    Resistance Training Performed Yes    VAD Patient? No    PAD/SET Patient? No      Pain Assessment   Currently in Pain? No/denies    Pain Score 0-No pain    Multiple Pain Sites No             Capillary Blood Glucose: No results found for this or any previous visit (from the past 24 hours).    Social History   Tobacco Use  Smoking Status Former   Types: Cigarettes   Passive exposure: Never  Smokeless Tobacco Never  Tobacco Comments   Smoke 1-1 1/2 packs a day    Goals Met:  Independence with exercise equipment Exercise tolerated well No report of concerns or symptoms today Strength training completed today  Goals Unmet:  Not Applicable  Comments: Pt able to follow exercise prescription today without complaint.  Will continue to monitor for progression.

## 2023-11-14 ENCOUNTER — Encounter (HOSPITAL_COMMUNITY)
Admission: RE | Admit: 2023-11-14 | Discharge: 2023-11-14 | Disposition: A | Discharge status: Home / Self Care | Source: Ambulatory Visit | Attending: Cardiovascular Disease | Admitting: Cardiovascular Disease

## 2023-11-14 ENCOUNTER — Ambulatory Visit (HOSPITAL_BASED_OUTPATIENT_CLINIC_OR_DEPARTMENT_OTHER)

## 2023-11-14 DIAGNOSIS — I214 Non-ST elevation (NSTEMI) myocardial infarction: Secondary | ICD-10-CM

## 2023-11-14 LAB — GLUCOSE, CAPILLARY
Glucose-Capillary: 156 mg/dL — ABNORMAL HIGH (ref 70–99)
Glucose-Capillary: 145 mg/dL — ABNORMAL HIGH (ref 70–99)

## 2023-11-14 NOTE — Progress Notes (Signed)
 Daily Session Note  Patient Details  Name: [name] MRN: [id number] Date of Birth: 10/26/1940 Referring Provider:   Flowsheet Row CARDIAC REHAB PHASE II ORIENTATION from 10/31/2023 in [facility] CARDIAC REHABILITATION  Referring Provider Fransico Ivy MD  [name] MD]       Encounter Date: 11/14/2023  Check In:  Session Check In - 11/14/23 1430       Check-In   Supervising physician immediately available to respond to emergencies See telemetry face sheet for immediately available MD    Location [facility]    Staff Present Jerrol Morelle, BSN, RN, WTA-C;Twain Stenseth, RN;Debra Lincoln Renshaw, RN, BSN    Virtual Visit No    Medication changes reported     No    Fall or balance concerns reported    No    Warm-up and Cool-down Performed on first and last piece of equipment    Resistance Training Performed Yes    VAD Patient? No    PAD/SET Patient? No      Pain Assessment   Currently in Pain? No/denies    Pain Score 0-No pain    Multiple Pain Sites No             Capillary Blood Glucose: Results for orders placed or performed during the hospital encounter of 11/14/23 (from the past 24 hours)  Glucose, capillary     Status: Abnormal   Collection Time: 11/14/23  2:19 PM  Result Value Ref Range   Glucose-Capillary 156 (H) 70 - 99 mg/dL      Social History   Tobacco Use  Smoking Status Former   Types: Cigarettes   Passive exposure: Never  Smokeless Tobacco Never  Tobacco Comments   Smoke 1-1 1/2 packs a day    Goals Met:  Independence with exercise equipment Exercise tolerated well No report of concerns or symptoms today Strength training completed today  Goals Unmet:  Not Applicable  Comments: Pt able to follow exercise prescription today without complaint.  Will continue to monitor for progression.

## 2023-11-16 ENCOUNTER — Encounter (HOSPITAL_COMMUNITY)
Admission: RE | Admit: 2023-11-16 | Discharge: 2023-11-16 | Disposition: A | Discharge status: Home / Self Care | Source: Ambulatory Visit | Attending: Cardiovascular Disease

## 2023-11-16 DIAGNOSIS — I214 Non-ST elevation (NSTEMI) myocardial infarction: Secondary | ICD-10-CM

## 2023-11-16 LAB — GLUCOSE, CAPILLARY: Glucose-Capillary: 154 mg/dL — ABNORMAL HIGH (ref 70–99)

## 2023-11-16 NOTE — Progress Notes (Signed)
 Daily Session Note  Patient Details  Name: MERCEDEE CHESTER MRN: [id number] Date of Birth: 1940-07-26 Referring Provider:   Flowsheet Row CARDIAC REHAB PHASE II ORIENTATION from 10/31/2023 in [facility] CARDIAC REHABILITATION  Referring Provider Fransico Ivy MD  [name] MD]       Encounter Date: 11/16/2023  Check In:  Session Check In - 11/16/23 1400       Check-In   Supervising physician immediately available to respond to emergencies See telemetry face sheet for immediately available MD    Location [facility]    Staff Present Ronna Coho BSN, RN;Heather Toy Freund, [place], Exercise Physiologist;Brittany Annette Barters, BSN, RN, WTA-C    Virtual Visit No    Medication changes reported     No    Fall or balance concerns reported    Yes    Comments Pt having dizziness and feels unsteady today-using a cane    Tobacco Cessation No Change    Warm-up and Cool-down Performed on first and last piece of equipment    Resistance Training Performed Yes    VAD Patient? No    PAD/SET Patient? No      Pain Assessment   Currently in Pain? No/denies    Pain Score 0-No pain    Multiple Pain Sites No             Capillary Blood Glucose: Results for orders placed or performed during the hospital encounter of 11/16/23 (from the past 24 hours)  Glucose, capillary     Status: Abnormal   Collection Time: 11/16/23  2:11 PM  Result Value Ref Range   Glucose-Capillary 154 (H) 70 - 99 mg/dL      Social History   Tobacco Use  Smoking Status Former   Types: Cigarettes   Passive exposure: Never  Smokeless Tobacco Never  Tobacco Comments   Smoke 1-1 1/2 packs a day    Goals Met:  Independence with exercise equipment Exercise tolerated well No report of concerns or symptoms today Strength training completed today  Goals Unmet:  Not Applicable  Comments: Aaron AasAaron AasPt able to follow exercise prescription today without complaint.  Will continue to monitor for  progression.

## 2023-11-18 ENCOUNTER — Encounter (HOSPITAL_COMMUNITY)
Admission: RE | Admit: 2023-11-18 | Discharge: 2023-11-18 | Disposition: A | Discharge status: Home / Self Care | Source: Ambulatory Visit | Attending: Cardiovascular Disease | Admitting: Cardiovascular Disease

## 2023-11-18 DIAGNOSIS — I214 Non-ST elevation (NSTEMI) myocardial infarction: Secondary | ICD-10-CM | POA: Diagnosis not present

## 2023-11-18 NOTE — Progress Notes (Signed)
 Daily Session Note  Patient Details  Name: [name] MRN: [id number] Date of Birth: [birth year] Referring Provider:   Flowsheet Row CARDIAC REHAB PHASE II ORIENTATION from 10/31/2023 in [facility] CARDIAC REHABILITATION  Referring Provider Fransico Ivy MD  [name] MD]       Encounter Date: 11/18/2023  Check In:   Capillary Blood Glucose: No results found for this or any previous visit (from the past 24 hours).    Social History   Tobacco Use  Smoking Status Former   Types: Cigarettes   Passive exposure: Never  Smokeless Tobacco Never  Tobacco Comments   Smoke 1-1 1/2 packs a day    Goals Met:  Independence with exercise equipment Exercise tolerated well No report of concerns or symptoms today Strength training completed today  Goals Unmet:  Not Applicable  Comments: Pt able to follow exercise prescription today without complaint.  Will continue to monitor for progression.

## 2023-11-21 ENCOUNTER — Encounter (HOSPITAL_COMMUNITY)
Admission: RE | Admit: 2023-11-21 | Discharge: 2023-11-21 | Disposition: A | Discharge status: Home / Self Care | Source: Ambulatory Visit | Attending: Cardiovascular Disease | Admitting: Cardiovascular Disease

## 2023-11-21 DIAGNOSIS — I214 Non-ST elevation (NSTEMI) myocardial infarction: Secondary | ICD-10-CM | POA: Diagnosis not present

## 2023-11-21 NOTE — Progress Notes (Signed)
 Daily Session Note  Patient Details  Name: [name] MRN: [id number] Date of Birth: 25-May-1941 Referring Provider:   Flowsheet Row CARDIAC REHAB PHASE II ORIENTATION from 10/31/2023 in [facility] CARDIAC REHABILITATION  Referring Provider Fransico Ivy MD  [name] MD]       Encounter Date: 11/21/2023  Check In:  Session Check In - 11/21/23 1430       Check-In   Supervising physician immediately available to respond to emergencies See telemetry face sheet for immediately available MD    Location [facility]    Staff Present Clotilda Danish, BS, Exercise Physiologist;Brittany Annette Barters, BSN, RN, WTA-C;Keeanna Villafranca, RN;Jessica [place], MA, RCEP, CCRP, Amador Bad, RN, BSN    Virtual Visit No    Medication changes reported     No    Fall or balance concerns reported    No    Warm-up and Cool-down Performed on first and last piece of equipment    Resistance Training Performed Yes    VAD Patient? No    PAD/SET Patient? No      Pain Assessment   Currently in Pain? No/denies    Pain Score 0-No pain    Multiple Pain Sites No             Capillary Blood Glucose: No results found for this or any previous visit (from the past 24 hours).    Social History   Tobacco Use  Smoking Status Former   Types: Cigarettes   Passive exposure: Never  Smokeless Tobacco Never  Tobacco Comments   Smoke 1-1 1/2 packs a day    Goals Met:  Independence with exercise equipment Exercise tolerated well No report of concerns or symptoms today Strength training completed today  Goals Unmet:  Not Applicable  Comments: Pt able to follow exercise prescription today without complaint.  Will continue to monitor for progression.

## 2023-11-23 ENCOUNTER — Encounter (HOSPITAL_COMMUNITY)
Admission: RE | Admit: 2023-11-23 | Discharge: 2023-11-23 | Disposition: A | Discharge status: Home / Self Care | Source: Ambulatory Visit | Attending: Cardiovascular Disease | Admitting: Cardiovascular Disease

## 2023-11-23 ENCOUNTER — Encounter (HOSPITAL_COMMUNITY): Payer: Self-pay | Admitting: *Deleted

## 2023-11-23 DIAGNOSIS — I214 Non-ST elevation (NSTEMI) myocardial infarction: Secondary | ICD-10-CM

## 2023-11-23 NOTE — Progress Notes (Signed)
 Cardiac Individual Treatment Plan  Patient Details  Name: [name] MRN: [id number] Date of Birth: Jul 27, 1940 Referring Provider:   Flowsheet Row CARDIAC REHAB PHASE II ORIENTATION from 10/31/2023 in [facility] CARDIAC REHABILITATION  Referring Provider Fransico Ivy MD  [name] MD]       Initial Encounter Date:  Flowsheet Row CARDIAC REHAB PHASE II ORIENTATION from 10/31/2023 in [place] CARDIAC REHABILITATION  Date 10/31/23       Visit Diagnosis: NSTEMI (non-ST elevated myocardial infarction) [facility])  Patient's Home Medications on Admission:  Current Outpatient Medications:    albuterol  (PROVENTIL  HFA;VENTOLIN  HFA) 108 (90 BASE) MCG/ACT inhaler, Inhale 2 puffs into the lungs every 4 (four) hours as needed for shortness of breath., Disp: 1 Inhaler, Rfl: 0   albuterol  (PROVENTIL ) (2.5 MG/3ML) 0.083% nebulizer solution, Take 2.5 mg by nebulization every 6 (six) hours as needed for wheezing or shortness of breath., Disp: , Rfl:    Alirocumab  (PRALUENT ) 75 MG/ML SOAJ, Inject 1 mL (75 mg total) into the skin every 14 (fourteen) days. (Patient not taking: Reported on 10/28/2023), Disp: 6 mL, Rfl: 3   amitriptyline  (ELAVIL ) 25 MG tablet, Take 25 mg by mouth at bedtime., Disp: , Rfl:    anastrozole  (ARIMIDEX ) 1 MG tablet, Take 1 tablet (1 mg total) by mouth at bedtime., Disp: 90 tablet, Rfl: 3   aspirin  EC 81 MG tablet, Take 1 tablet (81 mg total) by mouth daily. Swallow whole., Disp: 90 tablet, Rfl: 0   carvedilol  (COREG ) 12.5 MG tablet, Take 1 tablet (12.5 mg total) by mouth 2 (two) times daily., Disp: 180 tablet, Rfl: 1   dorzolamide -timolol  (COSOPT ) 22.3-6.8 MG/ML ophthalmic solution, INSTILL 1 DROP INTO RIGHT EYE TWICE A DAY (Patient taking differently: Place 1 drop into the right eye 2 (two) times daily.), Disp: 30 mL, Rfl: 2   FARXIGA  5 MG TABS tablet, Take 5 mg by mouth every morning., Disp: , Rfl:    gabapentin  (NEURONTIN ) 300 MG capsule, Take 300 mg by mouth 3  (three) times daily. 9am, 1pm & 9pm, Disp: , Rfl:    hydrALAZINE  (APRESOLINE ) 25 MG tablet, Take 25 mg by mouth 3 (three) times daily. (Patient not taking: Reported on 10/31/2023), Disp: , Rfl:    isosorbide  mononitrate (IMDUR ) 120 MG 24 hr tablet, Take 1 tablet (120 mg total) by mouth daily., Disp: 90 tablet, Rfl: 1   Lancets (ONETOUCH DELICA PLUS LANCET30G) MISC, at bedtime., Disp: , Rfl:    latanoprost  (XALATAN ) 0.005 % ophthalmic solution, Place 1 drop into the left eye in the morning and at bedtime., Disp: , Rfl:    linagliptin  (TRADJENTA ) 5 MG TABS tablet, Take 5 mg by mouth at bedtime., Disp: , Rfl:    meclizine  (ANTIVERT ) 25 MG tablet, Take 25 mg by mouth every 6 (six) hours as needed for dizziness., Disp: , Rfl:    Multiple Vitamins-Minerals (PRESERVISION AREDS 2 PO), Take 1 capsule by mouth in the morning and at bedtime., Disp: , Rfl:    NON FORMULARY, Take 1 each by mouth at bedtime. CBD Gummy, Disp: , Rfl:    olmesartan (BENICAR) 40 MG tablet, Take 40 mg by mouth daily., Disp: , Rfl:    Omega-3 Fatty Acids (FISH OIL) 1000 MG CAPS, Take 1,000 mg by mouth 2 (two) times daily. 9am & 9pm, Disp: , Rfl:    ONETOUCH ULTRA test strip, 1 each by Other route at bedtime., Disp: , Rfl:    pantoprazole  (PROTONIX ) 40 MG tablet, Take 1 tablet (40  mg total) by mouth daily., Disp: 90 tablet, Rfl: 0   warfarin (COUMADIN ) 2 MG tablet, TAKE 1 TABLET BY MOUTH EVERY DAY AT BEDTIME OR AS DIRECTED BY THE COUMADIN  CLINIC, Disp: 90 tablet, Rfl: 1  Past Medical History: Past Medical History:  Diagnosis Date   Antral gastritis    EGD 11/15   Arthritis    Asthmatic bronchitis    Back pain    Breast cancer (HCC)    right breast   CAD in native artery 03/10/2021   Chronic diastolic heart failure (HCC) 05/20/2015   Grade 2 diastolic dysfunction.  04/2015.   Chronic kidney disease    kidney function low   COPD (chronic obstructive pulmonary disease) (HCC)    Diabetes mellitus    x 5 yrs   DVT of axillary  vein, acute left (HCC) 07/24/2012   GERD (gastroesophageal reflux disease)    Glaucoma    POAG OU   Heart murmur    rheum fever at age 36   History of hiatal hernia    History of kidney stones    Hyperlipidemia 05/20/2015   Hypertension    Hypertensive retinopathy    OU   Hypothyroidism    Kidney stones    Macular degeneration    Wet OD, Dry OS   Mixed hyperlipidemia    OSA (obstructive sleep apnea) 12/17/2021   does not use cpap on regular basis   Peripheral venous insufficiency    Pinched nerve    right elbow   Pneumonia    PONV (postoperative nausea and vomiting)    Sigmoid diverticulitis    Snoring 03/10/2021   Vertigo    chonic    Tobacco Use: Social History   Tobacco Use  Smoking Status Former   Types: Cigarettes   Passive exposure: Never  Smokeless Tobacco Never  Tobacco Comments   Smoke 1-1 1/2 packs a day    Labs: Review Flowsheet  More data exists      Latest Ref Rng & Units 11/25/2020 08/20/2021 03/11/2022 10/10/2023 10/11/2023  Labs for ITP Cardiac and Pulmonary Rehab  Cholestrol 0 - 200 mg/dL - 409  811  - 914   LDL (calc) 0 - 99 mg/dL - 60  63  - 782   HDL-C >40 mg/dL - 49  44  - 37   Trlycerides <150 mg/dL - 956  213  - 086   Hemoglobin A1c 4.8 - 5.6 % 7.5  - - 7.0  -    Capillary Blood Glucose: Lab Results  Component Value Date   GLUCAP 154 (H) 11/16/2023   GLUCAP 145 (H) 11/14/2023   GLUCAP 156 (H) 11/14/2023   GLUCAP 82 11/09/2023   GLUCAP 149 (H) 11/09/2023     Exercise Target Goals: Exercise Program Goal: Individual exercise prescription set using results from initial 6 min walk test and THRR while considering  patient's activity barriers and safety.   Exercise Prescription Goal: Starting with aerobic activity 30 plus minutes a day, 3 days per week for initial exercise prescription. Provide home exercise prescription and guidelines that participant acknowledges understanding prior to discharge.  Activity Barriers & Risk  Stratification:  Activity Barriers & Cardiac Risk Stratification - 10/28/23 1257       Activity Barriers & Cardiac Risk Stratification   Activity Barriers Assistive Device;History of Falls;Back Problems;Shortness of Breath;Balance Concerns;Chest Pain/Angina    Cardiac Risk Stratification Moderate             6 Minute Walk:  6 Minute  Walk     Row Name 10/31/23 1202         6 Minute Walk   Phase Initial     Distance 720 feet     Walk Time 6 minutes     # of Rest Breaks 0     MPH 1.36     METS 1.1     RPE 12     Perceived Dyspnea  0     VO2 Peak 3.9     Symptoms No     Resting HR 60 bpm     Resting BP 150/64     Resting Oxygen Saturation  95 %     Exercise Oxygen Saturation  during 6 min walk 96 %     Max Ex. HR 92 bpm     Max Ex. BP 160/60     2 Minute Post BP 158/64              Oxygen Initial Assessment:   Oxygen Re-Evaluation:   Oxygen Discharge (Final Oxygen Re-Evaluation):   Initial Exercise Prescription:  Initial Exercise Prescription - 10/31/23 1200       Date of Initial Exercise RX and Referring Provider   Date 10/31/23    Referring Provider Fransico Ivy MD   [name] MD     NuStep   Level 3    SPM 50    Minutes 15    METs 2      REL-XR   Level 2    Speed 60    Minutes 15    METs 2      Prescription Details   Frequency (times per week) 3    Duration Progress to 30 minutes of continuous aerobic without signs/symptoms of physical distress      Intensity   THRR 40-80% of Max Heartrate 91-122    Ratings of Perceived Exertion 11-13    Perceived Dyspnea 0-4      Resistance Training   Training Prescription Yes    Weight 4    Reps 10-15             Perform Capillary Blood Glucose checks as needed.  Exercise Prescription Changes:   Exercise Prescription Changes     Row Name 10/31/23 1200             Response to Exercise   Blood Pressure (Admit) 150/64       Blood Pressure (Exercise) 160/60        Blood Pressure (Exit) 158/64       Heart Rate (Admit) 60 bpm       Heart Rate (Exercise) 92 bpm       Heart Rate (Exit) 84 bpm       Oxygen Saturation (Admit) 95 %       Oxygen Saturation (Exercise) 96 %       Oxygen Saturation (Exit) 96 %       Rating of Perceived Exertion (Exercise) 12       Perceived Dyspnea (Exercise) 0                Exercise Comments:   Exercise Comments     Row Name 11/08/23 1037           Exercise Comments First full day of exercise!  Patient was oriented to gym and equipment including functions, settings, policies, and procedures.  Patient's individual exercise prescription and treatment plan were reviewed.  All starting workloads were established based on the results of the 6  minute walk test done at initial orientation visit.  The plan for exercise progression was also introduced and progression will be customized based on patient's performance and goals.                Exercise Goals and Review:   Exercise Goals     Row Name 11/08/23 1037             Exercise Goals   Increase Physical Activity Yes       Intervention Develop an individualized exercise prescription for aerobic and resistive training based on initial evaluation findings, risk stratification, comorbidities and participant's personal goals.;Provide advice, education, support and counseling about physical activity/exercise needs.       Expected Outcomes Long Term: Add in home exercise to make exercise part of routine and to increase amount of physical activity.;Short Term: Attend rehab on a regular basis to increase amount of physical activity.;Long Term: Exercising regularly at least 3-5 days a week.       Increase Strength and Stamina Yes       Intervention Develop an individualized exercise prescription for aerobic and resistive training based on initial evaluation findings, risk stratification, comorbidities and participant's personal goals.;Provide advice, education, support  and counseling about physical activity/exercise needs.       Expected Outcomes Short Term: Increase workloads from initial exercise prescription for resistance, speed, and METs.;Short Term: Perform resistance training exercises routinely during rehab and add in resistance training at home;Long Term: Improve cardiorespiratory fitness, muscular endurance and strength as measured by increased METs and functional capacity ( )       Able to understand and use rate of perceived exertion (RPE) scale Yes       Intervention Provide education and explanation on how to use RPE scale       Expected Outcomes Short Term: Able to use RPE daily in rehab to express subjective intensity level;Long Term:  Able to use RPE to guide intensity level when exercising independently       Able to understand and use Dyspnea scale Yes       Intervention Provide education and explanation on how to use Dyspnea scale       Expected Outcomes Short Term: Able to use Dyspnea scale daily in rehab to express subjective sense of shortness of breath during exertion;Long Term: Able to use Dyspnea scale to guide intensity level when exercising independently       Knowledge and understanding of Target Heart Rate Range (THRR) Yes       Intervention Provide education and explanation of THRR including how the numbers were predicted and where they are located for reference       Expected Outcomes Short Term: Able to state/look up THRR;Long Term: Able to use THRR to govern intensity when exercising independently;Short Term: Able to use daily as guideline for intensity in rehab       Able to check pulse independently Yes       Intervention Provide education and demonstration on how to check pulse in carotid and radial arteries.;Review the importance of being able to check your own pulse for safety during independent exercise       Expected Outcomes Short Term: Able to explain why pulse checking is important during independent exercise;Long Term:  Able to check pulse independently and accurately       Understanding of Exercise Prescription Yes       Intervention Provide education, explanation, and written materials on patient's individual exercise prescription  Expected Outcomes Short Term: Able to explain program exercise prescription;Long Term: Able to explain home exercise prescription to exercise independently                Exercise Goals Re-Evaluation :  Exercise Goals Re-Evaluation     Row Name 11/08/23 1037             Exercise Goal Re-Evaluation   Exercise Goals Review Able to understand and use Dyspnea scale;Knowledge and understanding of Target Heart Rate Range (THRR);Able to understand and use rate of perceived exertion (RPE) scale;Understanding of Exercise Prescription       Comments Reviewed RPE and dyspnea scale, THR and program prescription with pt today.  Pt voiced understanding and was given a copy of goals to take home.       Expected Outcomes Short: Use RPE daily to regulate intensity.  Long: Follow program prescription in THR.                 Discharge Exercise Prescription (Final Exercise Prescription Changes):  Exercise Prescription Changes - 10/31/23 1200       Response to Exercise   Blood Pressure (Admit) 150/64    Blood Pressure (Exercise) 160/60    Blood Pressure (Exit) 158/64    Heart Rate (Admit) 60 bpm    Heart Rate (Exercise) 92 bpm    Heart Rate (Exit) 84 bpm    Oxygen Saturation (Admit) 95 %    Oxygen Saturation (Exercise) 96 %    Oxygen Saturation (Exit) 96 %    Rating of Perceived Exertion (Exercise) 12    Perceived Dyspnea (Exercise) 0             Nutrition:  Target Goals: Understanding of nutrition guidelines, daily intake of sodium 1500mg , cholesterol 200mg , calories 30% from fat and 7% or less from saturated fats, daily to have 5 or more servings of fruits and vegetables.  Biometrics:  Pre Biometrics - 10/31/23 1208       Pre Biometrics   Height 5\' 2"   (1.575 m)    Weight 160 lb 4.4 oz (72.7 kg)    Waist Circumference 37 inches    Hip Circumference 40 inches    Waist to Hip Ratio 0.93 %    BMI (Calculated) 29.31    Grip Strength 16.3 kg              Nutrition Therapy Plan and Nutrition Goals:   Nutrition Assessments:  MEDIFICTS Score Key: >=70 Need to make dietary changes  40-70 Heart Healthy Diet <= 40 Therapeutic Level Cholesterol Diet  Flowsheet Row CARDIAC REHAB PHASE II ORIENTATION from 10/31/2023 in [facility] CARDIAC REHABILITATION  Picture Your Plate Total Score on Admission 34      Picture Your Plate Scores: <16 Unhealthy dietary pattern with much room for improvement. 41-50 Dietary pattern unlikely to meet recommendations for good health and room for improvement. 51-60 More healthful dietary pattern, with some room for improvement.  >60 Healthy dietary pattern, although there may be some specific behaviors that could be improved.    Nutrition Goals Re-Evaluation:   Nutrition Goals Discharge (Final Nutrition Goals Re-Evaluation):   Psychosocial: Target Goals: Acknowledge presence or absence of significant depression and/or stress, maximize coping skills, provide positive support system. Participant is able to verbalize types and ability to use techniques and skills needed for reducing stress and depression.  Initial Review & Psychosocial Screening:  Initial Psych Review & Screening - 10/28/23 1349       Initial Review  Current issues with Current Psychotropic Meds      Family Dynamics   Good Support System? Yes      Barriers   Psychosocial barriers to participate in program The patient should benefit from training in stress management and relaxation.;There are no identifiable barriers or psychosocial needs.      Screening Interventions   Interventions Encouraged to exercise;To provide support and resources with identified psychosocial needs;Provide feedback about the scores to participant     Expected Outcomes Short Term goal: Utilizing psychosocial counselor, staff and physician to assist with identification of specific Stressors or current issues interfering with healing process. Setting desired goal for each stressor or current issue identified.;Long Term Goal: Stressors or current issues are controlled or eliminated.;Short Term goal: Identification and review with participant of any Quality of Life or Depression concerns found by scoring the questionnaire.;Long Term goal: The participant improves quality of Life and PHQ9 Scores as seen by post scores and/or verbalization of changes             Quality of Life Scores:  Quality of Life - 10/31/23 1211       Quality of Life   Select Quality of Life      Quality of Life Scores   Health/Function Pre 22.37 %    Socioeconomic Pre 30 %    Psych/Spiritual Pre 30 %    Family Pre 18 %    GLOBAL Pre 24.92 %            Scores of 19 and below usually indicate a poorer quality of life in these areas.  A difference of  2-3 points is a clinically meaningful difference.  A difference of 2-3 points in the total score of the Quality of Life Index has been associated with significant improvement in overall quality of life, self-image, physical symptoms, and general health in studies assessing change in quality of life.  PHQ-9: Review Flowsheet       10/31/2023 09/17/2021  Depression screen PHQ 2/9  Decreased Interest 1 1  Down, Depressed, Hopeless 0 1  PHQ - 2 Score 1 2  Altered sleeping 1 0  Tired, decreased energy 2 1  Change in appetite 0 0  Feeling bad or failure about yourself  1 0  Trouble concentrating 1 0  Moving slowly or fidgety/restless 0 0  Suicidal thoughts 0 0  PHQ-9 Score 6 3   Interpretation of Total Score  Total Score Depression Severity:  1-4 = Minimal depression, 5-9 = Mild depression, 10-14 = Moderate depression, 15-19 = Moderately severe depression, 20-27 = Severe depression   Psychosocial Evaluation  and Intervention:  Psychosocial Evaluation - 10/28/23 1350       Psychosocial Evaluation & Interventions   Interventions Stress management education;Relaxation education;Encouraged to exercise with the program and follow exercise prescription    Comments Patient was referred to CR with NSTEMI. She did not have an intervention. She has had a stent several years ago. She continues to have some chest pain which she reported to Neomi Banks, NP at her follow up appointment. The pain usually resolves with time but can happen at rest or activity. She is on Imdur . She thinks it is related to her history of breast cancer. She lives a lone. She says her husband died 4 years ago and she is still adjusting to being alone but she is very active with her church and participates in a lot of activities with them. She says her son and granddaughter are her  main family support. She has a new great grand daughter and she loves to spend time with her. She denies and depression, anxiety, or stressors but she was tearful today when ask about her chest pain. She says she does not want to be a burden to her children and does not like to admit she is having chest pain. Amtriptyline is on her medication list but she was not aware of what she is taking but does have neuropathy. She plans to bring a list for her orientation visit. Her goals for the program are to get stronger and to be able to get back to doing all the activities she was doing prior to her NSTEMI. She has no barriers identified to complete the program.    Expected Outcomes Short Term: Patient will start the program and attend consistently. Long Term: Patient will complete the program meeting personal goals.    Continue Psychosocial Services  Follow up required by staff             Psychosocial Re-Evaluation:   Psychosocial Discharge (Final Psychosocial Re-Evaluation):   Vocational Rehabilitation: Provide vocational rehab assistance to qualifying  candidates.   Vocational Rehab Evaluation & Intervention:  Vocational Rehab - 10/28/23 1348       Initial Vocational Rehab Evaluation & Intervention   Assessment shows need for Vocational Rehabilitation No      Vocational Rehab Re-Evaulation   Comments Patient is retired.             Education: Education Goals: Education classes will be provided on a weekly basis, covering required topics. Participant will state understanding/return demonstration of topics presented.  Learning Barriers/Preferences:  Learning Barriers/Preferences - 10/28/23 1349       Learning Barriers/Preferences   Learning Barriers None    Learning Preferences Skilled Demonstration;Audio             Education Topics: Hypertension, Hypertension Reduction -Define heart disease and high blood pressure. Discus how high blood pressure affects the body and ways to reduce high blood pressure.   Exercise and Your Heart -Discuss why it is important to exercise, the FITT principles of exercise, normal and abnormal responses to exercise, and how to exercise safely.   Angina -Discuss definition of angina, causes of angina, treatment of angina, and how to decrease risk of having angina.   Cardiac Medications -Review what the following cardiac medications are used for, how they affect the body, and side effects that may occur when taking the medications.  Medications include Aspirin , Beta blockers, calcium channel blockers, ACE Inhibitors, angiotensin receptor blockers, diuretics, digoxin, and antihyperlipidemics.   Congestive Heart Failure -Discuss the definition of CHF, how to live with CHF, the signs and symptoms of CHF, and how keep track of weight and sodium intake.   Heart Disease and Intimacy -Discus the effect sexual activity has on the heart, how changes occur during intimacy as we age, and safety during sexual activity.   Smoking Cessation / COPD -Discuss different methods to quit smoking,  the health benefits of quitting smoking, and the definition of COPD.   Nutrition I: Fats -Discuss the types of cholesterol, what cholesterol does to the heart, and how cholesterol levels can be controlled. Flowsheet Row CARDIAC REHAB PHASE II EXERCISE from 11/16/2023 in [place] CARDIAC REHABILITATION  Date 11/09/23  Educator HB  Instruction Review Code 1- Verbalizes Understanding       Nutrition II: Labels -Discuss the different components of food labels and how to read food label  Heart Parts/Heart Disease and PAD -Discuss the anatomy of the heart, the pathway of blood circulation through the heart, and these are affected by heart disease.   Stress I: Signs and Symptoms -Discuss the causes of stress, how stress may lead to anxiety and depression, and ways to limit stress. Flowsheet Row CARDIAC REHAB PHASE II EXERCISE from 11/16/2023 in [place] CARDIAC REHABILITATION  Date 11/16/23  Educator HB  Instruction Review Code 1- Verbalizes Understanding       Stress II: Relaxation -Discuss different types of relaxation techniques to limit stress.   Warning Signs of Stroke / TIA -Discuss definition of a stroke, what the signs and symptoms are of a stroke, and how to identify when someone is having stroke.   Knowledge Questionnaire Score:   Core Components/Risk Factors/Patient Goals at Admission:  Personal Goals and Risk Factors at Admission - 10/28/23 1348       Core Components/Risk Factors/Patient Goals on Admission    Weight Management Weight Maintenance    Improve shortness of breath with ADL's Yes    Intervention Provide education, individualized exercise plan and daily activity instruction to help decrease symptoms of SOB with activities of daily living.    Expected Outcomes Short Term: Improve cardiorespiratory fitness to achieve a reduction of symptoms when performing ADLs;Long Term: Be able to perform more ADLs without symptoms or delay the onset of symptoms     Diabetes Yes    Intervention Provide education about signs/symptoms and action to take for hypo/hyperglycemia.;Provide education about proper nutrition, including hydration, and aerobic/resistive exercise prescription along with prescribed medications to achieve blood glucose in normal ranges: Fasting glucose 65-99 mg/dL    Expected Outcomes Short Term: Participant verbalizes understanding of the signs/symptoms and immediate care of hyper/hypoglycemia, proper foot care and importance of medication, aerobic/resistive exercise and nutrition plan for blood glucose control.;Long Term: Attainment of HbA1C < 7%.    Heart Failure Yes    Intervention Provide a combined exercise and nutrition program that is supplemented with education, support and counseling about heart failure. Directed toward relieving symptoms such as shortness of breath, decreased exercise tolerance, and extremity edema.    Expected Outcomes Improve functional capacity of life;Short term: Attendance in program 2-3 days a week with increased exercise capacity. Reported lower sodium intake. Reported increased fruit and vegetable intake. Reports medication compliance.;Short term: Daily weights obtained and reported for increase. Utilizing diuretic protocols set by physician.;Long term: Adoption of self-care skills and reduction of barriers for early signs and symptoms recognition and intervention leading to self-care maintenance.    Hypertension Yes    Intervention Provide education on lifestyle modifcations including regular physical activity/exercise, weight management, moderate sodium restriction and increased consumption of fresh fruit, vegetables, and low fat dairy, alcohol  moderation, and smoking cessation.;Monitor prescription use compliance.    Expected Outcomes Long Term: Maintenance of blood pressure at goal levels.;Short Term: Continued assessment and intervention until BP is < 140/50mm HG in hypertensive participants. < 130/35mm HG in  hypertensive participants with diabetes, heart failure or chronic kidney disease.    Lipids Yes    Intervention Provide education and support for participant on nutrition & aerobic/resistive exercise along with prescribed medications to achieve LDL 70mg , HDL >40mg .    Expected Outcomes Short Term: Participant states understanding of desired cholesterol values and is compliant with medications prescribed. Participant is following exercise prescription and nutrition guidelines.;Long Term: Cholesterol controlled with medications as prescribed, with individualized exercise RX and with personalized nutrition plan. Value goals: LDL < 70mg ,  HDL > 40 mg.             Core Components/Risk Factors/Patient Goals Review:    Core Components/Risk Factors/Patient Goals at Discharge (Final Review):    ITP Comments:  ITP Comments     Row Name 10/28/23 1404 11/08/23 1037 11/23/23 1012       ITP Comments Virtual orientation visit completed for cardiac rehab with NSTEMI. On-site orientation visit scheduled for 10/31/23. Documentation 10:30 First full day of exercise!  Patient was oriented to gym and equipment including functions, settings, policies, and procedures.  Patient's individual exercise prescription and treatment plan were reviewed.  All starting workloads were established based on the results of the 6 minute walk test done at initial orientation visit.  The plan for exercise progression was also introduced and progression will be customized based on patient's performance and goals. 30 day review completed. ITP sent to Dr. Armida Lander, Medical Director of Cardiac Rehab. Continue with ITP unless changes are made by physician.  New to program.              Comments: 30 day review

## 2023-11-23 NOTE — Progress Notes (Signed)
 Daily Session Note  Patient Details  Name: [name] MRN: [id number] Date of Birth: 1940-07-12 Referring Provider:   Flowsheet Row CARDIAC REHAB PHASE II ORIENTATION from 10/31/2023 in [facility] CARDIAC REHABILITATION  Referring Provider Fransico Ivy MD  [name] MD]       Encounter Date: 11/23/2023  Check In:  Session Check In - 11/23/23 1405       Check-In   Supervising physician immediately available to respond to emergencies See telemetry face sheet for immediately available MD    Location [facility]    Staff Present Ronna Coho BSN, RN;Heather Toy Freund, BS, Exercise Physiologist;Jessica [place], MA, RCEP, CCRP, CCET;Brooke Dubois, RN    Virtual Visit No    Medication changes reported     No    Fall or balance concerns reported    Yes    Comments Pt still feeling unsteady today-using cane    Tobacco Cessation No Change    Warm-up and Cool-down Performed on first and last piece of equipment    Resistance Training Performed Yes    VAD Patient? No    PAD/SET Patient? No      Pain Assessment   Currently in Pain? No/denies    Pain Score 0-No pain    Multiple Pain Sites No             Capillary Blood Glucose: No results found for this or any previous visit (from the past 24 hours).    Social History   Tobacco Use  Smoking Status Former   Types: Cigarettes   Passive exposure: Never  Smokeless Tobacco Never  Tobacco Comments   Smoke 1-1 1/2 packs a day    Goals Met:  Independence with exercise equipment Exercise tolerated well No report of concerns or symptoms today Strength training completed today  Goals Unmet:  Not Applicable  Comments: Aaron AasAaron AasPt able to follow exercise prescription today without complaint.  Will continue to monitor for progression.

## 2023-11-25 ENCOUNTER — Encounter (HOSPITAL_COMMUNITY)

## 2023-11-28 ENCOUNTER — Encounter (HOSPITAL_COMMUNITY)

## 2023-11-29 ENCOUNTER — Encounter: Admitting: *Deleted

## 2023-11-29 DIAGNOSIS — Z5181 Encounter for therapeutic drug level monitoring: Secondary | ICD-10-CM

## 2023-11-29 DIAGNOSIS — I82A12 Acute embolism and thrombosis of left axillary vein: Secondary | ICD-10-CM | POA: Diagnosis not present

## 2023-11-29 LAB — POCT INR: INR: 2.2 (ref 2.0–3.0)

## 2023-11-29 NOTE — Patient Instructions (Signed)
 Continue warfarin 2 tablets daily except 1 tablet on Mondays and Thursdays Recheck INR in 4 wk Call Coumadin  clinic for any questions or changes in medications.

## 2023-11-30 ENCOUNTER — Encounter (HOSPITAL_COMMUNITY)
Admission: RE | Admit: 2023-11-30 | Discharge: 2023-11-30 | Disposition: A | Discharge status: Home / Self Care | Source: Ambulatory Visit | Attending: Cardiovascular Disease | Admitting: Cardiovascular Disease

## 2023-11-30 DIAGNOSIS — I214 Non-ST elevation (NSTEMI) myocardial infarction: Secondary | ICD-10-CM | POA: Diagnosis not present

## 2023-11-30 NOTE — Progress Notes (Signed)
 Daily Session Note  Patient Details  Name: [name] MRN: [id number] Date of Birth: Nov 16, 1940 Referring Provider:   Flowsheet Row CARDIAC REHAB PHASE II ORIENTATION from 10/31/2023 in [facility] CARDIAC REHABILITATION  Referring Provider Fransico Ivy MD  [name] MD]       Encounter Date: 11/30/2023  Check In:  Session Check In - 11/30/23 1400       Check-In   Supervising physician immediately available to respond to emergencies See telemetry face sheet for immediately available MD    Location [facility]    Staff Present Doug Gehrig, RN, BSN;Heather Toy Freund, BS, Exercise Physiologist;Brittany Foley, BSN, RN    Virtual Visit No    Medication changes reported     No    Fall or balance concerns reported    No    Warm-up and Cool-down Performed on first and last piece of equipment    Resistance Training Performed Yes    VAD Patient? No    PAD/SET Patient? No      Pain Assessment   Currently in Pain? No/denies    Pain Score 0-No pain    Multiple Pain Sites No             Capillary Blood Glucose: No results found for this or any previous visit (from the past 24 hours).    Social History   Tobacco Use  Smoking Status Former   Types: Cigarettes   Passive exposure: Never  Smokeless Tobacco Never  Tobacco Comments   Smoke 1-1 1/2 packs a day    Goals Met:  Independence with exercise equipment Exercise tolerated well No report of concerns or symptoms today Strength training completed today  Goals Unmet:  Not Applicable  Comments: Pt able to follow exercise prescription today without complaint.  Will continue to monitor for progression.

## 2023-12-02 ENCOUNTER — Encounter (HOSPITAL_COMMUNITY)
Admission: RE | Admit: 2023-12-02 | Discharge: 2023-12-02 | Disposition: A | Discharge status: Home / Self Care | Source: Ambulatory Visit | Attending: Cardiovascular Disease | Admitting: Cardiovascular Disease

## 2023-12-02 DIAGNOSIS — I214 Non-ST elevation (NSTEMI) myocardial infarction: Secondary | ICD-10-CM | POA: Diagnosis not present

## 2023-12-02 NOTE — Progress Notes (Signed)
 Daily Session Note  Patient Details  Name: [name] MRN: [id number] Date of Birth: September 27, 1940 Referring Provider:   Flowsheet Row CARDIAC REHAB PHASE II ORIENTATION from 10/31/2023 in [facility] CARDIAC REHABILITATION  Referring Provider Fransico Ivy MD  [name] MD]       Encounter Date: 12/02/2023  Check In:  Session Check In - 12/02/23 1430       Check-In   Supervising physician immediately available to respond to emergencies See telemetry face sheet for immediately available MD    Staff Present Colvin Dec, Adah Acron, RN, BSN    Virtual Visit No    Medication changes reported     No    Fall or balance concerns reported    No    Warm-up and Cool-down Performed on first and last piece of equipment    Resistance Training Performed Yes    VAD Patient? No    PAD/SET Patient? No      Pain Assessment   Currently in Pain? No/denies    Pain Score 0-No pain    Multiple Pain Sites No             Capillary Blood Glucose: No results found for this or any previous visit (from the past 24 hours).    Social History   Tobacco Use  Smoking Status Former   Types: Cigarettes   Passive exposure: Never  Smokeless Tobacco Never  Tobacco Comments   Smoke 1-1 1/2 packs a day    Goals Met:  Independence with exercise equipment Exercise tolerated well No report of concerns or symptoms today Strength training completed today  Goals Unmet:  Not Applicable  Comments: Pt able to follow exercise prescription today without complaint.  Will continue to monitor for progression.

## 2023-12-05 ENCOUNTER — Encounter (HOSPITAL_COMMUNITY)
Admission: RE | Admit: 2023-12-05 | Discharge: 2023-12-05 | Disposition: A | Discharge status: Home / Self Care | Source: Ambulatory Visit | Attending: Cardiovascular Disease | Admitting: Cardiovascular Disease

## 2023-12-05 DIAGNOSIS — I214 Non-ST elevation (NSTEMI) myocardial infarction: Secondary | ICD-10-CM | POA: Diagnosis present

## 2023-12-05 NOTE — Progress Notes (Signed)
 Daily Session Note  Patient Details  Name: [name] MRN: [id number] Date of Birth: 1941/06/20 Referring Provider:   Flowsheet Row CARDIAC REHAB PHASE II ORIENTATION from 10/31/2023 in [facility] CARDIAC REHABILITATION  Referring Provider Fransico Ivy MD  [name] MD]       Encounter Date: 12/05/2023  Check In:  Session Check In - 12/05/23 1429       Check-In   Supervising physician immediately available to respond to emergencies See telemetry face sheet for immediately available MD    Location [facility]    Staff Present Jerrol Morelle, BSN, RN, Aggie Horton, MA, RCEP, CCRP, CCET    Virtual Visit No    Medication changes reported     No    Fall or balance concerns reported    No    Tobacco Cessation No Change    Warm-up and Cool-down Performed on first and last piece of equipment    Resistance Training Performed Yes    VAD Patient? No    PAD/SET Patient? No      Pain Assessment   Currently in Pain? No/denies             Capillary Blood Glucose: No results found for this or any previous visit (from the past 24 hours).    Social History   Tobacco Use  Smoking Status Former   Types: Cigarettes   Passive exposure: Never  Smokeless Tobacco Never  Tobacco Comments   Smoke 1-1 1/2 packs a day    Goals Met:  Independence with exercise equipment Exercise tolerated well No report of concerns or symptoms today Strength training completed today  Goals Unmet:  Not Applicable  Comments: Pt able to follow exercise prescription today without complaint.  Will continue to monitor for progression.

## 2023-12-07 ENCOUNTER — Encounter (HOSPITAL_COMMUNITY)
Admission: RE | Admit: 2023-12-07 | Discharge: 2023-12-07 | Disposition: A | Discharge status: Home / Self Care | Source: Ambulatory Visit | Attending: Cardiovascular Disease

## 2023-12-07 DIAGNOSIS — I214 Non-ST elevation (NSTEMI) myocardial infarction: Secondary | ICD-10-CM

## 2023-12-07 NOTE — Progress Notes (Signed)
 Daily Session Note  Patient Details  Name: [name] MRN: [id number] Date of Birth: Oct 15, 1940 Referring Provider:   Flowsheet Row CARDIAC REHAB PHASE II ORIENTATION from 10/31/2023 in [facility] CARDIAC REHABILITATION  Referring Provider Fransico Ivy MD  [name] MD]       Encounter Date: 12/07/2023  Check In:  Session Check In - 12/07/23 1400       Check-In   Supervising physician immediately available to respond to emergencies See telemetry face sheet for immediately available MD    Location [facility]    Staff Present Ronna Coho BSN, RN;Heather Toy Freund, BS, Exercise Physiologist;Brittany Annette Barters, BSN, RN, Tisha Forget, RN, BSN    Virtual Visit No    Medication changes reported     No    Fall or balance concerns reported    No    Tobacco Cessation No Change    Warm-up and Cool-down Performed on first and last piece of equipment    Resistance Training Performed Yes    VAD Patient? No    PAD/SET Patient? No      Pain Assessment   Currently in Pain? No/denies    Pain Score 0-No pain    Multiple Pain Sites No             Capillary Blood Glucose: No results found for this or any previous visit (from the past 24 hours).    Social History   Tobacco Use  Smoking Status Former   Types: Cigarettes   Passive exposure: Never  Smokeless Tobacco Never  Tobacco Comments   Smoke 1-1 1/2 packs a day    Goals Met:  Independence with exercise equipment Exercise tolerated well No report of concerns or symptoms today Strength training completed today  Goals Unmet:  Not Applicable  Comments: Aaron AasAaron AasPt able to follow exercise prescription today without complaint.  Will continue to monitor for progression.

## 2023-12-08 ENCOUNTER — Ambulatory Visit: Payer: Self-pay | Admitting: Cardiology

## 2023-12-09 ENCOUNTER — Encounter (HOSPITAL_COMMUNITY)
Admission: RE | Admit: 2023-12-09 | Discharge: 2023-12-09 | Disposition: A | Discharge status: Home / Self Care | Source: Ambulatory Visit | Attending: Cardiovascular Disease | Admitting: Cardiovascular Disease

## 2023-12-09 DIAGNOSIS — I214 Non-ST elevation (NSTEMI) myocardial infarction: Secondary | ICD-10-CM | POA: Diagnosis not present

## 2023-12-09 NOTE — Progress Notes (Signed)
 Daily Session Note  Patient Details  Name: [name] MRN: [id number] Date of Birth: 06-29-1941 Referring Provider:   Flowsheet Row CARDIAC REHAB PHASE II ORIENTATION from 10/31/2023 in [facility] CARDIAC REHABILITATION  Referring Provider Fransico Ivy MD  [name] MD]       Encounter Date: 12/09/2023  Check In:  Session Check In - 12/09/23 1412       Check-In   Supervising physician immediately available to respond to emergencies See telemetry face sheet for immediately available MD    Location [facility]    Staff Present Doug Gehrig, RN, BSN;Heather Toy Freund, BS, Exercise Physiologist;Brittany Annette Barters, BSN, RN, WTA-C    Virtual Visit No    Medication changes reported     No    Fall or balance concerns reported    No    Warm-up and Cool-down Performed on first and last piece of equipment    Resistance Training Performed Yes    VAD Patient? No    PAD/SET Patient? No      Pain Assessment   Currently in Pain? No/denies    Pain Score 0-No pain    Multiple Pain Sites No             Capillary Blood Glucose: No results found for this or any previous visit (from the past 24 hours).    Social History   Tobacco Use  Smoking Status Former   Types: Cigarettes   Passive exposure: Never  Smokeless Tobacco Never  Tobacco Comments   Smoke 1-1 1/2 packs a day    Goals Met:  Independence with exercise equipment Exercise tolerated well No report of concerns or symptoms today Strength training completed today  Goals Unmet:  Not Applicable  Comments: Pt able to follow exercise prescription today without complaint.  Will continue to monitor for progression.

## 2023-12-12 ENCOUNTER — Encounter (HOSPITAL_COMMUNITY)
Admission: RE | Admit: 2023-12-12 | Discharge: 2023-12-12 | Disposition: A | Discharge status: Home / Self Care | Source: Ambulatory Visit | Attending: Cardiovascular Disease | Admitting: Cardiovascular Disease

## 2023-12-12 DIAGNOSIS — I214 Non-ST elevation (NSTEMI) myocardial infarction: Secondary | ICD-10-CM | POA: Diagnosis not present

## 2023-12-12 NOTE — Progress Notes (Signed)
 Daily Session Note  Patient Details  Name: [name] MRN: [id number] Date of Birth: Sep 11, 1940 Referring Provider:   Flowsheet Row CARDIAC REHAB PHASE II ORIENTATION from 10/31/2023 in [facility] CARDIAC REHABILITATION  Referring Provider Fransico Ivy MD  [name] MD]       Encounter Date: 12/12/2023  Check In:  Session Check In - 12/12/23 1430       Check-In   Supervising physician immediately available to respond to emergencies See telemetry face sheet for immediately available MD    Location [facility]    Staff Present Doug Gehrig, RN, BSN;Ishmail Mcmanamon Annette Barters, BSN, RN, WTA-C;Phyllis Billingsley, RN    Virtual Visit No    Medication changes reported     No    Fall or balance concerns reported    No    Comments Pt left hand causing her some pain.    Tobacco Cessation No Change    Warm-up and Cool-down Performed on first and last piece of equipment    Resistance Training Performed Yes    VAD Patient? No    PAD/SET Patient? No      Pain Assessment   Currently in Pain? Yes    Pain Score 5     Pain Location Hand    Pain Orientation Left    Pain Type Chronic pain    Multiple Pain Sites No             Capillary Blood Glucose: No results found for this or any previous visit (from the past 24 hours).    Social History   Tobacco Use  Smoking Status Former   Types: Cigarettes   Passive exposure: Never  Smokeless Tobacco Never  Tobacco Comments   Smoke 1-1 1/2 packs a day    Goals Met:  Independence with exercise equipment Exercise tolerated well No report of concerns or symptoms today Strength training completed today  Goals Unmet:  Not Applicable  Comments: Pt able to follow exercise prescription today without complaint.  Will continue to monitor for progression.

## 2023-12-14 ENCOUNTER — Encounter (HOSPITAL_COMMUNITY)
Admission: RE | Admit: 2023-12-14 | Discharge: 2023-12-14 | Disposition: A | Discharge status: Home / Self Care | Source: Ambulatory Visit | Attending: Cardiovascular Disease | Admitting: Cardiovascular Disease

## 2023-12-14 DIAGNOSIS — I214 Non-ST elevation (NSTEMI) myocardial infarction: Secondary | ICD-10-CM

## 2023-12-14 NOTE — Progress Notes (Signed)
 Daily Session Note  Patient Details  Name: [name] MRN: [id number] Date of Birth: March 23, 1941 Referring Provider:   Flowsheet Row CARDIAC REHAB PHASE II ORIENTATION from 10/31/2023 in [facility] CARDIAC REHABILITATION  Referring Provider Darlene Ivy MD  Darlene Sos MD]       Encounter Date: 12/14/2023  Check In:  Session Check In - 12/14/23 1400       Check-In   Supervising physician immediately available to respond to emergencies See telemetry face sheet for immediately available MD    Location [facility]    Staff Present [name] BSN, RN;[name], [place], Exercise Physiologist    Virtual Visit No    Medication changes reported     No    Fall or balance concerns reported    No    Tobacco Cessation No Change    Warm-up and Cool-down Performed on first and last piece of equipment    Resistance Training Performed Yes    VAD Patient? No    PAD/SET Patient? No      Pain Assessment   Currently in Pain? No/denies    Pain Score 0-No pain    Multiple Pain Sites No             Capillary Blood Glucose: No results found for this or any previous visit (from the past 24 hours).    Social History   Tobacco Use  Smoking Status Former   Types: Cigarettes   Passive exposure: Never  Smokeless Tobacco Never  Tobacco Comments   Smoke 1-1 1/2 packs a day    Goals Met:  Independence with exercise equipment Exercise tolerated well No report of concerns or symptoms today Strength training completed today  Goals Unmet:  Not Applicable  Comments: Darlene AasAaron AasPt able to follow exercise prescription today without complaint.  Will continue to monitor for progression.

## 2023-12-16 ENCOUNTER — Telehealth (HOSPITAL_BASED_OUTPATIENT_CLINIC_OR_DEPARTMENT_OTHER): Payer: Self-pay | Admitting: *Deleted

## 2023-12-16 ENCOUNTER — Telehealth: Payer: Self-pay | Admitting: Pharmacy Technician

## 2023-12-16 ENCOUNTER — Encounter (HOSPITAL_BASED_OUTPATIENT_CLINIC_OR_DEPARTMENT_OTHER): Payer: Self-pay | Admitting: Cardiovascular Disease

## 2023-12-16 ENCOUNTER — Encounter (HOSPITAL_COMMUNITY)
Admission: RE | Admit: 2023-12-16 | Discharge: 2023-12-16 | Disposition: A | Discharge status: Home / Self Care | Source: Ambulatory Visit | Attending: Cardiovascular Disease

## 2023-12-16 ENCOUNTER — Ambulatory Visit (INDEPENDENT_AMBULATORY_CARE_PROVIDER_SITE_OTHER): Payer: PPO | Admitting: Cardiovascular Disease

## 2023-12-16 VITALS — BP 154/64 | HR 76 | Ht 62.0 in | Wt 154.0 lb

## 2023-12-16 DIAGNOSIS — I700 Atherosclerosis of aorta: Secondary | ICD-10-CM

## 2023-12-16 DIAGNOSIS — I10 Essential (primary) hypertension: Secondary | ICD-10-CM | POA: Diagnosis not present

## 2023-12-16 DIAGNOSIS — I2510 Atherosclerotic heart disease of native coronary artery without angina pectoris: Secondary | ICD-10-CM

## 2023-12-16 DIAGNOSIS — I25118 Atherosclerotic heart disease of native coronary artery with other forms of angina pectoris: Secondary | ICD-10-CM | POA: Diagnosis not present

## 2023-12-16 DIAGNOSIS — I214 Non-ST elevation (NSTEMI) myocardial infarction: Secondary | ICD-10-CM

## 2023-12-16 DIAGNOSIS — I5032 Chronic diastolic (congestive) heart failure: Secondary | ICD-10-CM

## 2023-12-16 DIAGNOSIS — I6522 Occlusion and stenosis of left carotid artery: Secondary | ICD-10-CM | POA: Diagnosis not present

## 2023-12-16 DIAGNOSIS — I2089 Other forms of angina pectoris: Secondary | ICD-10-CM | POA: Insufficient documentation

## 2023-12-16 DIAGNOSIS — G4733 Obstructive sleep apnea (adult) (pediatric): Secondary | ICD-10-CM

## 2023-12-16 DIAGNOSIS — E7849 Other hyperlipidemia: Secondary | ICD-10-CM

## 2023-12-16 MED ORDER — HYDRALAZINE HCL 50 MG PO TABS: 50.0000 mg | ORAL_TABLET | Freq: Three times a day (TID) | ORAL | 3 refills | Status: AC

## 2023-12-16 NOTE — Patient Instructions (Addendum)
.  Medication Instructions:  INCREASE YOUR HYDRALAZINE  TO 50 MG THREE TIMES A DAY   Lab Work: NONE  Testing/Procedures: NONE  Follow-Up: At [facility], you and your health needs are our priority.  As part of our continuing mission to provide you with exceptional heart care, our providers are all part of one team.  This team includes your primary Cardiologist (physician) and Advanced Practice Providers or APPs (Physician Assistants and Nurse Practitioners) who all work together to provide you with the care you need, when you need it.  Your next appointment:   2 TO 3  month(s)  Provider:   [name], MD, Slater Duncan, NP, Neomi Banks, NP, or Donivan Furry, PharmD   We recommend signing up for the patient portal called MyChart.  Sign up information is provided on this After Visit Summary.  MyChart is used to connect with patients for Virtual Visits (Telemedicine).  Patients are able to view lab/test results, encounter notes, upcoming appointments, etc.  Non-urgent messages can be sent to your provider as well.   To learn more about what you can do with MyChart, go to [email or web address].   Other Instructions MONITOR YOUR BLOOD PRESSURE DAILY. BRING YOUR READINGS AND MACHINE TO FOLLOW UP

## 2023-12-16 NOTE — Progress Notes (Signed)
 Daily Session Note  Patient Details  Name: [name] MRN: [id number] Date of Birth: 04/09/1941 Referring Provider:   Flowsheet Row CARDIAC REHAB PHASE II ORIENTATION from 10/31/2023 in [facility] CARDIAC REHABILITATION  Referring Provider Fransico Ivy MD  [name] MD]    Encounter Date: 12/16/2023  Check In:  Session Check In - 12/16/23 1420       Check-In   Supervising physician immediately available to respond to emergencies See telemetry face sheet for immediately available MD    Location [facility]    Staff Present Clotilda Danish, BS, Exercise Physiologist;Phyllis Billingsley, RN;Brittany Annette Barters, BSN, RN, WTA-C    Virtual Visit No    Medication changes reported     No    Fall or balance concerns reported    No    Tobacco Cessation No Change    Warm-up and Cool-down Performed on first and last piece of equipment    Resistance Training Performed Yes    VAD Patient? No    PAD/SET Patient? No      Pain Assessment   Currently in Pain? No/denies    Pain Score 0-No pain    Multiple Pain Sites No          Capillary Blood Glucose: No results found for this or any previous visit (from the past 24 hours).    Social History   Tobacco Use  Smoking Status Former   Types: Cigarettes   Passive exposure: Never  Smokeless Tobacco Never  Tobacco Comments   Smoke 1-1 1/2 packs a day    Goals Met:  Independence with exercise equipment Exercise tolerated well No report of concerns or symptoms today Strength training completed today  Goals Unmet:  Not Applicable  Comments: Pt able to follow exercise prescription today without complaint.  Will continue to monitor for progression.

## 2023-12-16 NOTE — Telephone Encounter (Signed)
 Patient was in the office today asking about assistance with Repatha .  After reviewing chart looks as though patient was getting assistance with [employer] assistance program   Will forward to Rx/pharmacy team for review

## 2023-12-16 NOTE — Telephone Encounter (Addendum)
 [name], CPhT to Me     12/16/23  4:34 PM I called the patient and she is on praulent because she has an allergy to repatha . [facility] has her grant information and she will get it there. Thank you   Error typing Repatha , meant Praluent 

## 2023-12-16 NOTE — Progress Notes (Signed)
 Cardiology Office Note:  .   Date:  12/16/2023  ID:  [name], DOB 17-Nov-1940, MRN [id number] PCP: Minus Amel, MD  [facility] Providers Cardiologist:  None    History of Present Illness: .   [name] is a 83 y.o. female with CAD (50% LAD), HFpEF,  hypertension, hyperlipidemia, prior DVT on warfarin, breast cancer s/p lumpectomy and XRT, and diabetes type 2 who presents for follow up.  She was first seen 04/2015 at which time she reported occasional chest pain.  She had an exercise Myoview  04/2015 that showed LVEF 78% with a small defect of moderate severity in the mid anterior and apical anterior region.  This was felt to be due to breast attenuation artifact.  However, she underwent cardiac catheterization on 05/26/15 that revealed a 50% LAD lesion. There was concern that there may be a component of vasospasm so long-acting nitrates were started. Her blood pressure and hyperlipidemia medications have been titrated due to poor control.  She is also on Lasix  due to lower extremity edema.   [name]' husband died of COVID double pneumonia on 07/2019.  He was treated with Remdesivir at [facility] outpatient infusion center.  She was experiencing atypical chest pain.  She was referred for Lexiscan  Myoview  that revealed LVEF 74% with no ischemia.  Since then she called our office with concern for dizziness.  She spoke with our pharmacist and was advised to stop her hydralazine .  However her dizziness did not improve and it was not thought to be due to the medication or her blood pressure.  She had an episode while sitting on the toilet had to grab the garbage can to vomit.  They recommended that she see her PCP.  She had vertigo in the past and has used meclizine .  Her LFTs were elevated so her PCP stopped her pravastatin .  She since started Praluent .    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  She underwent lumpectomy and XRT. She had a sleep study 03/2021 which showed mild  sleep apnea. Given underling medical conditions, it was recommended she start an oral airway device or a CPAP.  She was admitted 07/2021 with the flu and sinusitis.  [name] was overloaded, advised to take Lasix  and wear extra compression socks. She followed up on 8/23 and a CTA was recommended which confirmed severe stenosis in the left common carotid and ICA. She underwent carotid endarterectomy 03/24/22.  At her visit 06/2022 she was doing well from a cardiac standpoint.  [name] was admitted 10/2023 with NSTEMI.  Left heart cath revealed 70% OM lesion and otherwise nonobstructive disease.  Medical management was recommended.  She saw Neomi Banks, [phone] and was doing well.  She did note occasional angina and carvedilol  was increased.  Discussed the use of AI scribe software for clinical note transcription with the patient, who gave verbal consent to proceed.  History of Present Illness  [name] experiences occasional light chest pain, primarily during physical activity. The pain is less severe than previous episodes and does not last long. It does not significantly limit her activities as she tends to sit down when it occurs.  She experiences dizziness, particularly when standing up, which has led her to use a cane for balance. She has had falls in the past, prompting the use of the cane. Turning her head exacerbates the dizziness.  She has not been receiving her Praluent  medication for a long time. Her current medications  include carvedilol , hydralazine  three times a day, Imdur  (isosorbide ), olmesartan 40 mg once a day, and Coumadin . Her blood pressure readings at home are generally lower than the reading taken during the visit, with the diastolic number being around 125.  No significant swelling in her legs or feet. She believes her breathing has been stable. Her vision in one eye is blurry due to a stent placed to lower eye pressure.  ROS:  As per HPI  Studies Reviewed: .        Echo 10/12/23: 1. Left ventricular ejection fraction, by estimation, is 60 to 65%. The  left ventricle has normal function. The left ventricle has no regional  wall motion abnormalities. Left ventricular diastolic parameters are  indeterminate.   2. Right ventricular systolic function is normal. The right ventricular  size is normal.   3. The mitral valve is grossly normal. Trivial mitral valve  regurgitation. No evidence of mitral stenosis.   4. The aortic valve is grossly normal. Aortic valve regurgitation is not  visualized. No aortic stenosis is present.   5. The inferior vena cava is normal in size with greater than 50%  respiratory variability, suggesting right atrial pressure of 3 mmHg.   Coronary angiography 10/11/2023: LM: Normal LAD: Prox 20% disease, distal 40% disease         Diag 1 ostial 70% stenosis, TIMI III flow, unlikely to be acute culprit Lcx: Mid 20% disease RCA: Prox 10% disease   LVEDP 12 mmHg      No culprit lesion identified to explain NSTEMI. Patient is currently on warfarin for DVT treatment. I do not think she needs DAPT. At the most, I would recommend adding one antiplatelet agent, either Aspirin  81 mg daily or Plavix 75 mg daily.    Risk Assessment/Calculations:     HYPERTENSION CONTROL Vitals:   12/16/23 0803 12/16/23 0858  BP: (!) 140/64 (!) 154/64    The patient's blood pressure is elevated above target today.  In order to address the patient's elevated BP:           Physical Exam:   VS:  BP (!) 154/64   Pulse 76   Ht 5' 2 (1.575 m)   Wt 154 lb (69.9 kg)   SpO2 96%   BMI 28.17 kg/m  , BMI Body mass index is 28.17 kg/m. GENERAL:  Well appearing HEENT: Pupils equal round and reactive, fundi not visualized, oral mucosa unremarkable NECK:  No jugular venous distention, waveform within normal limits, carotid upstroke brisk and symmetric, no bruits, no thyromegaly LUNGS:  Clear to auscultation bilaterally HEART:  RRR.  PMI not displaced  or sustained,S1 and S2 within normal limits, no S3, no S4, no clicks, no rubs, no murmurs ABD:  Flat, positive bowel sounds normal in frequency in pitch, no bruits, no rebound, no guarding, no midline pulsatile mass, no hepatomegaly, no splenomegaly EXT:  2 plus pulses throughout, no edema, no cyanosis no clubbing SKIN:  No rashes no nodules NEURO:  Cranial nerves II through XII grossly intact, motor grossly intact throughout PSYCH:  Cognitively intact, oriented to person place and time   ASSESSMENT AND PLAN: .    # CAD:  # Hyperlipidemia: # Stable angina:  Cath revealed moderate disease at thetime of her NSTEMI which is medically managed.  She has some chest pain, though less than prior.  BP uncontrolled.  Cholesterol levels elevated, possibly due to not receiving Praluent . - Verify Praluent  prescription status and ensure dispensing.  LDL goal <55. -  Continue aspirin , carvedilol , Imdur  and Praluent  - Repeat lipids/CMP in 2-3 months  # Dizziness and balance issues # Hypertension:  Dizziness and balance issues possibly related to orthostatic hypotension or vision issues. History of falls and blurry vision may contribute.  She was not orthostatic.  BP increased with standing.   - Increased hydralazine  to 50mg  tid.  Continue carvedilol , Imdur  and olmesartan  # DVT: Chronic warfarin managed by her PCP         Dispo: f/u 2 months APP/PHarmD for BP.  F/u with me in 6 months.  Signed, [name], MD

## 2023-12-16 NOTE — Telephone Encounter (Signed)
   I called [facility] and they have the grant information. I called the patient and made her aware.

## 2023-12-19 ENCOUNTER — Encounter (HOSPITAL_COMMUNITY)
Admission: RE | Admit: 2023-12-19 | Discharge: 2023-12-19 | Disposition: A | Discharge status: Home / Self Care | Source: Ambulatory Visit | Attending: Cardiovascular Disease

## 2023-12-19 DIAGNOSIS — I214 Non-ST elevation (NSTEMI) myocardial infarction: Secondary | ICD-10-CM | POA: Diagnosis not present

## 2023-12-19 LAB — GLUCOSE, CAPILLARY
Glucose-Capillary: 79 mg/dL (ref 70–99)
Glucose-Capillary: 85 mg/dL (ref 70–99)

## 2023-12-19 NOTE — Progress Notes (Signed)
 Daily Session Note  Patient Details  Name: [name] MRN: [id number] Date of Birth: 10-26-1940 Referring Provider:   Flowsheet Row CARDIAC REHAB PHASE II ORIENTATION from 10/31/2023 in [facility] CARDIAC REHABILITATION  Referring Provider Fransico Ivy MD  [name] MD]    Encounter Date: 12/19/2023  Check In:  Session Check In - 12/19/23 1415       Check-In   Supervising physician immediately available to respond to emergencies See telemetry face sheet for immediately available ER MD    Location [facility]    Staff Present Clotilda Danish, BS, Exercise Physiologist;Castor Gittleman Lincoln Renshaw, RN, BSN    Virtual Visit No    Medication changes reported     No    Fall or balance concerns reported    No    Warm-up and Cool-down Performed on first and last piece of equipment    Resistance Training Performed Yes    VAD Patient? No    PAD/SET Patient? No      Pain Assessment   Currently in Pain? No/denies    Pain Score 0-No pain    Multiple Pain Sites No          Capillary Blood Glucose: No results found for this or any previous visit (from the past 24 hours).    Social History   Tobacco Use  Smoking Status Former   Types: Cigarettes   Passive exposure: Never  Smokeless Tobacco Never  Tobacco Comments   Smoke 1-1 1/2 packs a day    Goals Met:  Independence with exercise equipment Exercise tolerated well No report of concerns or symptoms today Strength training completed today  Goals Unmet:  Not Applicable  Comments: Pt able to follow exercise prescription today without complaint.  Will continue to monitor for progression.

## 2023-12-21 ENCOUNTER — Encounter (HOSPITAL_COMMUNITY)
Admission: RE | Admit: 2023-12-21 | Discharge: 2023-12-21 | Disposition: A | Discharge status: Home / Self Care | Source: Ambulatory Visit | Attending: Cardiovascular Disease | Admitting: Cardiovascular Disease

## 2023-12-21 DIAGNOSIS — I214 Non-ST elevation (NSTEMI) myocardial infarction: Secondary | ICD-10-CM | POA: Diagnosis not present

## 2023-12-21 LAB — GLUCOSE, CAPILLARY: Glucose-Capillary: 182 mg/dL — ABNORMAL HIGH (ref 70–99)

## 2023-12-21 NOTE — Progress Notes (Signed)
 Daily Session Note  Patient Details  Name: CYRA SPADER MRN: [id number] Date of Birth: 1940-12-09 Referring Provider:   Flowsheet Row CARDIAC REHAB PHASE II ORIENTATION from 10/31/2023 in [facility] CARDIAC REHABILITATION  Referring Provider Fransico Ivy MD  [name] MD]    Encounter Date: 12/21/2023  Check In:  Session Check In - 12/21/23 1430       Check-In   Supervising physician immediately available to respond to emergencies See telemetry face sheet for immediately available MD    Location [facility]    Staff Present Clotilda Danish, BS, Exercise Physiologist;Brooke Rollin Clock, RN    Virtual Visit No    Medication changes reported     No    Fall or balance concerns reported    No    Tobacco Cessation No Change    Warm-up and Cool-down Performed on first and last piece of equipment    Resistance Training Performed Yes    VAD Patient? No    PAD/SET Patient? No      Pain Assessment   Currently in Pain? No/denies    Pain Score 0-No pain    Multiple Pain Sites No          Capillary Blood Glucose: Results for orders placed or performed during the hospital encounter of 12/21/23 (from the past 24 hours)  Glucose, capillary     Status: Abnormal   Collection Time: 12/21/23  2:07 PM  Result Value Ref Range   Glucose-Capillary 182 (H) 70 - 99 mg/dL      Social History   Tobacco Use  Smoking Status Former   Types: Cigarettes   Passive exposure: Never  Smokeless Tobacco Never  Tobacco Comments   Smoke 1-1 1/2 packs a day    Goals Met:  Independence with exercise equipment Exercise tolerated well No report of concerns or symptoms today Strength training completed today  Goals Unmet:  Not Applicable  Comments: Pt able to follow exercise prescription today without complaint.  Will continue to monitor for progression.

## 2023-12-21 NOTE — Progress Notes (Signed)
 Cardiac Individual Treatment Plan  Patient Details  Name: [name] MRN: [id number] Date of Birth: 12-04-1940 Referring Provider:   Flowsheet Row CARDIAC REHAB PHASE II ORIENTATION from 10/31/2023 in [facility] CARDIAC REHABILITATION  Referring Provider Fransico Ivy MD  [name] MD]    Initial Encounter Date:  Flowsheet Row CARDIAC REHAB PHASE II ORIENTATION from 10/31/2023 in [place] CARDIAC REHABILITATION  Date 10/31/23    Visit Diagnosis: NSTEMI (non-ST elevated myocardial infarction) [facility])  Patient's Home Medications on Admission:  Current Outpatient Medications:    albuterol  (PROVENTIL  HFA;VENTOLIN  HFA) 108 (90 BASE) MCG/ACT inhaler, Inhale 2 puffs into the lungs every 4 (four) hours as needed for shortness of breath., Disp: 1 Inhaler, Rfl: 0   albuterol  (PROVENTIL ) (2.5 MG/3ML) 0.083% nebulizer solution, Take 2.5 mg by nebulization every 6 (six) hours as needed for wheezing or shortness of breath., Disp: , Rfl:    Alirocumab  (PRALUENT ) 75 MG/ML SOAJ, Inject 1 mL (75 mg total) into the skin every 14 (fourteen) days. (Patient not taking: Reported on 10/28/2023), Disp: 6 mL, Rfl: 3   amitriptyline  (ELAVIL ) 25 MG tablet, Take 25 mg by mouth at bedtime., Disp: , Rfl:    anastrozole  (ARIMIDEX ) 1 MG tablet, Take 1 tablet (1 mg total) by mouth at bedtime., Disp: 90 tablet, Rfl: 3   aspirin  EC 81 MG tablet, Take 1 tablet (81 mg total) by mouth daily. Swallow whole., Disp: 90 tablet, Rfl: 0   carvedilol  (COREG ) 12.5 MG tablet, Take 1 tablet (12.5 mg total) by mouth 2 (two) times daily., Disp: 180 tablet, Rfl: 1   dorzolamide -timolol  (COSOPT ) 22.3-6.8 MG/ML ophthalmic solution, INSTILL 1 DROP INTO RIGHT EYE TWICE A DAY (Patient taking differently: Place 1 drop into the right eye 2 (two) times daily.), Disp: 30 mL, Rfl: 2   FARXIGA  5 MG TABS tablet, Take 5 mg by mouth every morning., Disp: , Rfl:    gabapentin  (NEURONTIN ) 300 MG capsule, Take 300 mg by mouth 3 (three) times  daily. 9am, 1pm & 9pm, Disp: , Rfl:    hydrALAZINE  (APRESOLINE ) 50 MG tablet, Take 1 tablet (50 mg total) by mouth 3 (three) times daily., Disp: 270 tablet, Rfl: 3   isosorbide  mononitrate (IMDUR ) 120 MG 24 hr tablet, Take 1 tablet (120 mg total) by mouth daily., Disp: 90 tablet, Rfl: 1   Lancets (ONETOUCH DELICA PLUS LANCET30G) MISC, at bedtime., Disp: , Rfl:    latanoprost  (XALATAN ) 0.005 % ophthalmic solution, Place 1 drop into the left eye in the morning and at bedtime., Disp: , Rfl:    linagliptin  (TRADJENTA ) 5 MG TABS tablet, Take 5 mg by mouth at bedtime., Disp: , Rfl:    meclizine  (ANTIVERT ) 25 MG tablet, Take 25 mg by mouth every 6 (six) hours as needed for dizziness., Disp: , Rfl:    Multiple Vitamins-Minerals (PRESERVISION AREDS 2 PO), Take 1 capsule by mouth in the morning and at bedtime., Disp: , Rfl:    NON FORMULARY, Take 1 each by mouth at bedtime. CBD Gummy, Disp: , Rfl:    olmesartan (BENICAR) 40 MG tablet, Take 40 mg by mouth daily., Disp: , Rfl:    Omega-3 Fatty Acids (FISH OIL) 1000 MG CAPS, Take 1,000 mg by mouth 2 (two) times daily. 9am & 9pm, Disp: , Rfl:    ONETOUCH ULTRA test strip, 1 each by Other route at bedtime., Disp: , Rfl:    pantoprazole  (PROTONIX ) 40 MG tablet, Take 1 tablet (40 mg total) by mouth daily., Disp: 90 tablet,  Rfl: 0   warfarin (COUMADIN ) 2 MG tablet, TAKE 1 TABLET BY MOUTH EVERY DAY AT BEDTIME OR AS DIRECTED BY THE COUMADIN  CLINIC, Disp: 90 tablet, Rfl: 1  Past Medical History: Past Medical History:  Diagnosis Date   Antral gastritis    EGD 11/15   Arthritis    Asthmatic bronchitis    Back pain    Breast cancer (HCC)    right breast   CAD in native artery 03/10/2021   Chronic diastolic heart failure (HCC) 05/20/2015   Grade 2 diastolic dysfunction.  04/2015.   Chronic kidney disease    kidney function low   Chronic stable angina (HCC) 12/16/2023   COPD (chronic obstructive pulmonary disease) (HCC)    Diabetes mellitus    x 5 yrs   DVT  of axillary vein, acute left (HCC) 07/24/2012   GERD (gastroesophageal reflux disease)    Glaucoma    POAG OU   Heart murmur    rheum fever at age 14   History of hiatal hernia    History of kidney stones    Hyperlipidemia 05/20/2015   Hypertension    Hypertensive retinopathy    OU   Hypothyroidism    Kidney stones    Macular degeneration    Wet OD, Dry OS   Mixed hyperlipidemia    OSA (obstructive sleep apnea) 12/17/2021   does not use cpap on regular basis   Peripheral venous insufficiency    Pinched nerve    right elbow   Pneumonia    PONV (postoperative nausea and vomiting)    Sigmoid diverticulitis    Snoring 03/10/2021   Vertigo    chonic    Tobacco Use: Social History   Tobacco Use  Smoking Status Former   Types: Cigarettes   Passive exposure: Never  Smokeless Tobacco Never  Tobacco Comments   Smoke 1-1 1/2 packs a day    Labs: Review Flowsheet  More data exists      Latest Ref Rng & Units 11/25/2020 08/20/2021 03/11/2022 10/10/2023 10/11/2023  Labs for ITP Cardiac and Pulmonary Rehab  Cholestrol 0 - 200 mg/dL - 540  981  - 191   LDL (calc) 0 - 99 mg/dL - 60  63  - 478   HDL-C >40 mg/dL - 49  44  - 37   Trlycerides <150 mg/dL - 295  621  - 308   Hemoglobin A1c 4.8 - 5.6 % 7.5  - - 7.0  -    Capillary Blood Glucose: Lab Results  Component Value Date   GLUCAP 85 12/19/2023   GLUCAP 79 12/19/2023   GLUCAP 154 (H) 11/16/2023   GLUCAP 145 (H) 11/14/2023   GLUCAP 156 (H) 11/14/2023     Exercise Target Goals: Exercise Program Goal: Individual exercise prescription set using results from initial 6 min walk test and THRR while considering  patient's activity barriers and safety.   Exercise Prescription Goal: Starting with aerobic activity 30 plus minutes a day, 3 days per week for initial exercise prescription. Provide home exercise prescription and guidelines that participant acknowledges understanding prior to discharge.  Activity Barriers & Risk  Stratification:  Activity Barriers & Cardiac Risk Stratification - 10/28/23 1257       Activity Barriers & Cardiac Risk Stratification   Activity Barriers Assistive Device;History of Falls;Back Problems;Shortness of Breath;Balance Concerns;Chest Pain/Angina    Cardiac Risk Stratification Moderate          6 Minute Walk:  6 Minute Walk  Row Name 10/31/23 1202         6 Minute Walk   Phase Initial     Distance 720 feet     Walk Time 6 minutes     # of Rest Breaks 0     MPH 1.36     METS 1.1     RPE 12     Perceived Dyspnea  0     VO2 Peak 3.9     Symptoms No     Resting HR 60 bpm     Resting BP 150/64     Resting Oxygen Saturation  95 %     Exercise Oxygen Saturation  during 6 min walk 96 %     Max Ex. HR 92 bpm     Max Ex. BP 160/60     2 Minute Post BP 158/64        Oxygen Initial Assessment:   Oxygen Re-Evaluation:   Oxygen Discharge (Final Oxygen Re-Evaluation):   Initial Exercise Prescription:  Initial Exercise Prescription - 10/31/23 1200       Date of Initial Exercise RX and Referring Provider   Date 10/31/23    Referring Provider Fransico Ivy MD   [name] MD     NuStep   Level 3    SPM 50    Minutes 15    METs 2      REL-XR   Level 2    Speed 60    Minutes 15    METs 2      Prescription Details   Frequency (times per week) 3    Duration Progress to 30 minutes of continuous aerobic without signs/symptoms of physical distress      Intensity   THRR 40-80% of Max Heartrate 91-122    Ratings of Perceived Exertion 11-13    Perceived Dyspnea 0-4      Resistance Training   Training Prescription Yes    Weight 4    Reps 10-15          Perform Capillary Blood Glucose checks as needed.  Exercise Prescription Changes:   Exercise Prescription Changes     Row Name 10/31/23 1200 12/05/23 1500           Response to Exercise   Blood Pressure (Admit) 150/64 112/70      Blood Pressure (Exercise) 160/60 --       Blood Pressure (Exit) 158/64 90/48      Heart Rate (Admit) 60 bpm 89 bpm      Heart Rate (Exercise) 92 bpm 76 bpm      Heart Rate (Exit) 84 bpm 98 bpm      Oxygen Saturation (Admit) 95 % --      Oxygen Saturation (Exercise) 96 % --      Oxygen Saturation (Exit) 96 % --      Rating of Perceived Exertion (Exercise) 12 13      Perceived Dyspnea (Exercise) 0 --      Duration -- Continue with 30 min of aerobic exercise without signs/symptoms of physical distress.      Intensity -- THRR unchanged        Progression   Progression -- Continue to progress workloads to maintain intensity without signs/symptoms of physical distress.        Resistance Training   Training Prescription -- Yes      Weight -- 3      Reps -- 10-15        NuStep  Level -- 2      SPM -- 93      Minutes -- 15      METs -- 2.5        REL-XR   Level -- 1      Speed -- 58      Minutes -- 15      METs -- 4.2         Exercise Comments:   Exercise Comments     Row Name 11/08/23 1037           Exercise Comments First full day of exercise!  Patient was oriented to gym and equipment including functions, settings, policies, and procedures.  Patient's individual exercise prescription and treatment plan were reviewed.  All starting workloads were established based on the results of the 6 minute walk test done at initial orientation visit.  The plan for exercise progression was also introduced and progression will be customized based on patient's performance and goals.          Exercise Goals and Review:   Exercise Goals     Row Name 11/08/23 1037             Exercise Goals   Increase Physical Activity Yes       Intervention Develop an individualized exercise prescription for aerobic and resistive training based on initial evaluation findings, risk stratification, comorbidities and participant's personal goals.;Provide advice, education, support and counseling about physical activity/exercise needs.        Expected Outcomes Long Term: Add in home exercise to make exercise part of routine and to increase amount of physical activity.;Short Term: Attend rehab on a regular basis to increase amount of physical activity.;Long Term: Exercising regularly at least 3-5 days a week.       Increase Strength and Stamina Yes       Intervention Develop an individualized exercise prescription for aerobic and resistive training based on initial evaluation findings, risk stratification, comorbidities and participant's personal goals.;Provide advice, education, support and counseling about physical activity/exercise needs.       Expected Outcomes Short Term: Increase workloads from initial exercise prescription for resistance, speed, and METs.;Short Term: Perform resistance training exercises routinely during rehab and add in resistance training at home;Long Term: Improve cardiorespiratory fitness, muscular endurance and strength as measured by increased METs and functional capacity ( )       Able to understand and use rate of perceived exertion (RPE) scale Yes       Intervention Provide education and explanation on how to use RPE scale       Expected Outcomes Short Term: Able to use RPE daily in rehab to express subjective intensity level;Long Term:  Able to use RPE to guide intensity level when exercising independently       Able to understand and use Dyspnea scale Yes       Intervention Provide education and explanation on how to use Dyspnea scale       Expected Outcomes Short Term: Able to use Dyspnea scale daily in rehab to express subjective sense of shortness of breath during exertion;Long Term: Able to use Dyspnea scale to guide intensity level when exercising independently       Knowledge and understanding of Target Heart Rate Range (THRR) Yes       Intervention Provide education and explanation of THRR including how the numbers were predicted and where they are located for reference       Expected Outcomes Short  Term: Able to  state/look up THRR;Long Term: Able to use THRR to govern intensity when exercising independently;Short Term: Able to use daily as guideline for intensity in rehab       Able to check pulse independently Yes       Intervention Provide education and demonstration on how to check pulse in carotid and radial arteries.;Review the importance of being able to check your own pulse for safety during independent exercise       Expected Outcomes Short Term: Able to explain why pulse checking is important during independent exercise;Long Term: Able to check pulse independently and accurately       Understanding of Exercise Prescription Yes       Intervention Provide education, explanation, and written materials on patient's individual exercise prescription       Expected Outcomes Short Term: Able to explain program exercise prescription;Long Term: Able to explain home exercise prescription to exercise independently          Exercise Goals Re-Evaluation :  Exercise Goals Re-Evaluation     Row Name 11/08/23 1037 12/14/23 1502           Exercise Goal Re-Evaluation   Exercise Goals Review Able to understand and use Dyspnea scale;Knowledge and understanding of Target Heart Rate Range (THRR);Able to understand and use rate of perceived exertion (RPE) scale;Understanding of Exercise Prescription Increase Physical Activity;Increase Strength and Stamina;Understanding of Exercise Prescription      Comments Reviewed RPE and dyspnea scale, THR and program prescription with pt today.  Pt voiced understanding and was given a copy of goals to take home. Amina is off to a good start in rehab.  She has already started to notice a difference in how she is feeling.  She has noted that overall her stamina has started to pick up.  She admits to still having some days where it is a lot harder to get up and go than others.  We talked about how this is normal and will continue to improve as her stamina continues to  improve as well.      Expected Outcomes Short: Use RPE daily to regulate intensity.  Long: Follow program prescription in THR. Short; Review home exercise guidelines Long; Conitnue to improve stamina          Discharge Exercise Prescription (Final Exercise Prescription Changes):  Exercise Prescription Changes - 12/05/23 1500       Response to Exercise   Blood Pressure (Admit) 112/70    Blood Pressure (Exit) 90/48    Heart Rate (Admit) 89 bpm    Heart Rate (Exercise) 76 bpm    Heart Rate (Exit) 98 bpm    Rating of Perceived Exertion (Exercise) 13    Duration Continue with 30 min of aerobic exercise without signs/symptoms of physical distress.    Intensity THRR unchanged      Progression   Progression Continue to progress workloads to maintain intensity without signs/symptoms of physical distress.      Resistance Training   Training Prescription Yes    Weight 3    Reps 10-15      NuStep   Level 2    SPM 93    Minutes 15    METs 2.5      REL-XR   Level 1    Speed 58    Minutes 15    METs 4.2          Nutrition:  Target Goals: Understanding of nutrition guidelines, daily intake of sodium 1500mg , cholesterol 200mg , calories 30%  from fat and 7% or less from saturated fats, daily to have 5 or more servings of fruits and vegetables.  Biometrics:  Pre Biometrics - 10/31/23 1208       Pre Biometrics   Height 5' 2 (1.575 m)    Weight 72.7 kg    Waist Circumference 37 inches    Hip Circumference 40 inches    Waist to Hip Ratio 0.93 %    BMI (Calculated) 29.31    Grip Strength 16.3 kg           Nutrition Therapy Plan and Nutrition Goals:   Nutrition Assessments:  MEDIFICTS Score Key: >=70 Need to make dietary changes  40-70 Heart Healthy Diet <= 40 Therapeutic Level Cholesterol Diet  Flowsheet Row CARDIAC REHAB PHASE II ORIENTATION from 10/31/2023 in [facility] CARDIAC REHABILITATION  Picture Your Plate Total Score on Admission 34   Picture Your  Plate Scores: <46 Unhealthy dietary pattern with much room for improvement. 41-50 Dietary pattern unlikely to meet recommendations for good health and room for improvement. 51-60 More healthful dietary pattern, with some room for improvement.  >60 Healthy dietary pattern, although there may be some specific behaviors that could be improved.    Nutrition Goals Re-Evaluation:  Nutrition Goals Re-Evaluation     Row Name 12/14/23 1514             Goals   Nutrition Goal Heart Healthy Eating       Comment Yatzary has not been following much of a diet.  She is not eating like she should in general as there are days she does not want to go through the effort to make a meal for one.  We talked about making a normal amount and freezing the rest for other meals.  She does really like fruit and will try to get more and a variety.  She knows she needs to eat and is just going through motions.  We talked about trying meal replacements for when she doesn't really want to cook to make sure that she is maintaining intake.       Expected Outcome Short: Try meal replacements Long: Conitue to eat to [employer]          Nutrition Goals Discharge (Final Nutrition Goals Re-Evaluation):  Nutrition Goals Re-Evaluation - 12/14/23 1514       Goals   Nutrition Goal Heart Healthy Eating    Comment Adira has not been following much of a diet.  She is not eating like she should in general as there are days she does not want to go through the effort to make a meal for one.  We talked about making a normal amount and freezing the rest for other meals.  She does really like fruit and will try to get more and a variety.  She knows she needs to eat and is just going through motions.  We talked about trying meal replacements for when she doesn't really want to cook to make sure that she is maintaining intake.    Expected Outcome Short: Try meal replacements Long: Conitue to eat to [facility]          Psychosocial: Target  Goals: Acknowledge presence or absence of significant depression and/or stress, maximize coping skills, provide positive support system. Participant is able to verbalize types and ability to use techniques and skills needed for reducing stress and depression.  Initial Review & Psychosocial Screening:  Initial Psych Review & Screening - 10/28/23 1349  Initial Review   Current issues with Current Psychotropic Meds      Family Dynamics   Good Support System? Yes      Barriers   Psychosocial barriers to participate in program The patient should benefit from training in stress management and relaxation.;There are no identifiable barriers or psychosocial needs.      Screening Interventions   Interventions Encouraged to exercise;To provide support and resources with identified psychosocial needs;Provide feedback about the scores to participant    Expected Outcomes Short Term goal: Utilizing psychosocial counselor, staff and physician to assist with identification of specific Stressors or current issues interfering with healing process. Setting desired goal for each stressor or current issue identified.;Long Term Goal: Stressors or current issues are controlled or eliminated.;Short Term goal: Identification and review with participant of any Quality of Life or Depression concerns found by scoring the questionnaire.;Long Term goal: The participant improves quality of Life and PHQ9 Scores as seen by post scores and/or verbalization of changes          Quality of Life Scores:  Quality of Life - 10/31/23 1211       Quality of Life   Select Quality of Life      Quality of Life Scores   Health/Function Pre 22.37 %    Socioeconomic Pre 30 %    Psych/Spiritual Pre 30 %    Family Pre 18 %    GLOBAL Pre 24.92 %         Scores of 19 and below usually indicate a poorer quality of life in these areas.  A difference of  2-3 points is a clinically meaningful difference.  A difference of 2-3  points in the total score of the Quality of Life Index has been associated with significant improvement in overall quality of life, self-image, physical symptoms, and general health in studies assessing change in quality of life.  PHQ-9: Review Flowsheet       12/14/2023 10/31/2023 09/17/2021  Depression screen PHQ 2/9  Decreased Interest 0 1 1  Down, Depressed, Hopeless 1 0 1  PHQ - 2 Score 1 1 2   Altered sleeping 0 1 0  Tired, decreased energy 1 2 1   Change in appetite 2 0 0  Feeling bad or failure about yourself  1 1 0  Trouble concentrating 0 1 0  Moving slowly or fidgety/restless 0 0 0  Suicidal thoughts 0 0 0  PHQ-9 Score 5 6 3   Difficult doing work/chores Somewhat difficult - -   Interpretation of Total Score  Total Score Depression Severity:  1-4 = Minimal depression, 5-9 = Mild depression, 10-14 = Moderate depression, 15-19 = Moderately severe depression, 20-27 = Severe depression   Psychosocial Evaluation and Intervention:  Psychosocial Evaluation - 10/28/23 1350       Psychosocial Evaluation & Interventions   Interventions Stress management education;Relaxation education;Encouraged to exercise with the program and follow exercise prescription    Comments Patient was referred to CR with NSTEMI. She did not have an intervention. She has had a stent several years ago. She continues to have some chest pain which she reported to Neomi Banks, NP at her follow up appointment. The pain usually resolves with time but can happen at rest or activity. She is on Imdur . She thinks it is related to her history of breast cancer. She lives a lone. She says her husband died 4 years ago and she is still adjusting to being alone but she is very active with her church  and participates in a lot of activities with them. She says her son and granddaughter are her main family support. She has a new great grand daughter and she loves to spend time with her. She denies and depression, anxiety, or  stressors but she was tearful today when ask about her chest pain. She says she does not want to be a burden to her children and does not like to admit she is having chest pain. Amtriptyline is on her medication list but she was not aware of what she is taking but does have neuropathy. She plans to bring a list for her orientation visit. Her goals for the program are to get stronger and to be able to get back to doing all the activities she was doing prior to her NSTEMI. She has no barriers identified to complete the program.    Expected Outcomes Short Term: Patient will start the program and attend consistently. Long Term: Patient will complete the program meeting personal goals.    Continue Psychosocial Services  Follow up required by staff          Psychosocial Re-Evaluation:  Psychosocial Re-Evaluation     Row Name 12/14/23 1504             Psychosocial Re-Evaluation   Current issues with Current Psychotropic Meds;Current Stress Concerns       Comments Juliah is doing well in rehab. She admits to having some down days here and there but tries not to let them get to her.  She feels balanced most of the time, but living by herself is a big barrier.  She enjoys coming to class and her PHQ did improve by one point so far. With living alone, eating is hard for her and she does not always feel up to the effort.  She is active in church and the ladies meet at least twice a month for a meal out together which Emilina always looks forward to meeting.  She generally sleeps well.  On days she comes to rehab she feels like she is accoplishing something versus just sitting at home.  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       Expected Outcomes Short: Get out and about more to engage with others Long; Conitnue to exercise for mental boost       Interventions Encouraged to attend Cardiac Rehabilitation for the exercise       Continue Psychosocial Services  Follow up required by staff           Psychosocial Discharge (Final Psychosocial Re-Evaluation):  Psychosocial Re-Evaluation - 12/14/23 1504       Psychosocial Re-Evaluation   Current issues with Current Psychotropic Meds;Current Stress Concerns    Comments Terryl is doing well in rehab. She admits to having some down days here and there but tries not to let them get to her.  She feels balanced most of the time, but living by herself is a big barrier.  She enjoys coming to class and her PHQ did improve by one point so far. With living alone, eating is hard for her and she does not always feel up to the effort.  She is active in church and the ladies meet at least twice a month for a meal out together which Luzmaria always looks forward to meeting.  She generally sleeps well.  On days she comes to rehab she feels like she is accoplishing something versus just sitting at  home.  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    Expected Outcomes Short: Get out and about more to engage with others Long; Conitnue to exercise for mental boost    Interventions Encouraged to attend Cardiac Rehabilitation for the exercise    Continue Psychosocial Services  Follow up required by staff          Vocational Rehabilitation: Provide vocational rehab assistance to qualifying candidates.   Vocational Rehab Evaluation & Intervention:  Vocational Rehab - 10/28/23 1348       Initial Vocational Rehab Evaluation & Intervention   Assessment shows need for Vocational Rehabilitation No      Vocational Rehab Re-Evaulation   Comments Patient is retired.          Education: Education Goals: Education classes will be provided on a weekly basis, covering required topics. Participant will state understanding/return demonstration of topics presented.  Learning Barriers/Preferences:  Learning Barriers/Preferences - 10/28/23 1349       Learning Barriers/Preferences   Learning Barriers None    Learning Preferences Skilled  Demonstration;Audio          Education Topics: Hypertension, Hypertension Reduction -Define heart disease and high blood pressure. Discus how high blood pressure affects the body and ways to reduce high blood pressure. Flowsheet Row CARDIAC REHAB PHASE II EXERCISE from 12/14/2023 in [place] CARDIAC REHABILITATION  Date 11/23/23  Educator [facility]  Instruction Review Code 1- Verbalizes Understanding    Exercise and Your Heart -Discuss why it is important to exercise, the FITT principles of exercise, normal and abnormal responses to exercise, and how to exercise safely.   Angina -Discuss definition of angina, causes of angina, treatment of angina, and how to decrease risk of having angina. Flowsheet Row CARDIAC REHAB PHASE II EXERCISE from 12/14/2023 in [place] CARDIAC REHABILITATION  Date 12/14/23  [tests/procedures]  Educator HB  Instruction Review Code 1- Verbalizes Understanding    Cardiac Medications -Review what the following cardiac medications are used for, how they affect the body, and side effects that may occur when taking the medications.  Medications include Aspirin , Beta blockers, calcium channel blockers, ACE Inhibitors, angiotensin receptor blockers, diuretics, digoxin, and antihyperlipidemics. Flowsheet Row CARDIAC REHAB PHASE II EXERCISE from 12/14/2023 in [place] CARDIAC REHABILITATION  Date 11/30/23  Educator BS  Instruction Review Code 1- Verbalizes Understanding    Congestive Heart Failure -Discuss the definition of CHF, how to live with CHF, the signs and symptoms of CHF, and how keep track of weight and sodium intake.   Heart Disease and Intimacy -Discus the effect sexual activity has on the heart, how changes occur during intimacy as we age, and safety during sexual activity.   Smoking Cessation / COPD -Discuss different methods to quit smoking, the health benefits of quitting smoking, and the definition of COPD.   Nutrition I: Fats -Discuss the  types of cholesterol, what cholesterol does to the heart, and how cholesterol levels can be controlled. Flowsheet Row CARDIAC REHAB PHASE II EXERCISE from 12/14/2023 in [place] CARDIAC REHABILITATION  Date 11/09/23  Educator HB  Instruction Review Code 1- Verbalizes Understanding    Nutrition II: Labels -Discuss the different components of food labels and how to read food label   Heart Parts/Heart Disease and PAD -Discuss the anatomy of the heart, the pathway of blood circulation through the heart, and these are affected by heart disease.   Stress I: Signs and Symptoms -Discuss the causes of stress,  how stress may lead to anxiety and depression, and ways to limit stress. Flowsheet Row CARDIAC REHAB PHASE II EXERCISE from 12/14/2023 in [place] CARDIAC REHABILITATION  Date 11/16/23  Educator HB  Instruction Review Code 1- Verbalizes Understanding    Stress II: Relaxation -Discuss different types of relaxation techniques to limit stress.   Warning Signs of Stroke / TIA -Discuss definition of a stroke, what the signs and symptoms are of a stroke, and how to identify when someone is having stroke.   Knowledge Questionnaire Score:   Core Components/Risk Factors/Patient Goals at Admission:  Personal Goals and Risk Factors at Admission - 10/28/23 1348       Core Components/Risk Factors/Patient Goals on Admission    Weight Management Weight Maintenance    Improve shortness of breath with ADL's Yes    Intervention Provide education, individualized exercise plan and daily activity instruction to help decrease symptoms of SOB with activities of daily living.    Expected Outcomes Short Term: Improve cardiorespiratory fitness to achieve a reduction of symptoms when performing ADLs;Long Term: Be able to perform more ADLs without symptoms or delay the onset of symptoms    Diabetes Yes    Intervention Provide education about signs/symptoms and action to take for  hypo/hyperglycemia.;Provide education about proper nutrition, including hydration, and aerobic/resistive exercise prescription along with prescribed medications to achieve blood glucose in normal ranges: Fasting glucose 65-99 mg/dL    Expected Outcomes Short Term: Participant verbalizes understanding of the signs/symptoms and immediate care of hyper/hypoglycemia, proper foot care and importance of medication, aerobic/resistive exercise and nutrition plan for blood glucose control.;Long Term: Attainment of HbA1C < 7%.    Heart Failure Yes    Intervention Provide a combined exercise and nutrition program that is supplemented with education, support and counseling about heart failure. Directed toward relieving symptoms such as shortness of breath, decreased exercise tolerance, and extremity edema.    Expected Outcomes Improve functional capacity of life;Short term: Attendance in program 2-3 days a week with increased exercise capacity. Reported lower sodium intake. Reported increased fruit and vegetable intake. Reports medication compliance.;Short term: Daily weights obtained and reported for increase. Utilizing diuretic protocols set by physician.;Long term: Adoption of self-care skills and reduction of barriers for early signs and symptoms recognition and intervention leading to self-care maintenance.    Hypertension Yes    Intervention Provide education on lifestyle modifcations including regular physical activity/exercise, weight management, moderate sodium restriction and increased consumption of fresh fruit, vegetables, and low fat dairy, alcohol  moderation, and smoking cessation.;Monitor prescription use compliance.    Expected Outcomes Long Term: Maintenance of blood pressure at goal levels.;Short Term: Continued assessment and intervention until BP is < 140/46mm HG in hypertensive participants. < 130/87mm HG in hypertensive participants with diabetes, heart failure or chronic kidney disease.    Lipids  Yes    Intervention Provide education and support for participant on nutrition & aerobic/resistive exercise along with prescribed medications to achieve LDL 70mg , HDL >40mg .    Expected Outcomes Short Term: Participant states understanding of desired cholesterol values and is compliant with medications prescribed. Participant is following exercise prescription and nutrition guidelines.;Long Term: Cholesterol controlled with medications as prescribed, with individualized exercise RX and with personalized nutrition plan. Value goals: LDL < 70mg , HDL > 40 mg.          Core Components/Risk Factors/Patient Goals Review:   Goals and Risk Factor Review     Row Name 12/14/23 1520  Core Components/Risk Factors/Patient Goals Review   Personal Goals Review Weight Management/Obesity;Hypertension;Diabetes;Heart Failure       Review Efrat is doing well in rehab.  Her weight is steady.  Sugars and pressures are good and she checks them on occassion at home.  She has not noted any heart failure symptoms recently.       Expected Outcomes Short: Continue to work on weight Long: COnitnue to montior risk factors          Core Components/Risk Factors/Patient Goals at Discharge (Final Review):   Goals and Risk Factor Review - 12/14/23 1520       Core Components/Risk Factors/Patient Goals Review   Personal Goals Review Weight Management/Obesity;Hypertension;Diabetes;Heart Failure    Review Rilley is doing well in rehab.  Her weight is steady.  Sugars and pressures are good and she checks them on occassion at home.  She has not noted any heart failure symptoms recently.    Expected Outcomes Short: Continue to work on [employer] Long: COnitnue to montior risk factors          ITP Comments:  ITP Comments     Row Name 10/28/23 1404 11/08/23 1037 11/23/23 1012 12/21/23 1010     ITP Comments Virtual orientation visit completed for cardiac rehab with NSTEMI. On-site orientation visit scheduled for  10/31/23. Documentation 10:30 First full day of exercise!  Patient was oriented to gym and equipment including functions, settings, policies, and procedures.  Patient's individual exercise prescription and treatment plan were reviewed.  All starting workloads were established based on the results of the 6 minute walk test done at initial orientation visit.  The plan for exercise progression was also introduced and progression will be customized based on patient's performance and goals. 30 day review completed. ITP sent to Dr. Armida Lander, Medical Director of Cardiac Rehab. Continue with ITP unless changes are made by physician.  New to program. 30 day review completed. ITP sent to Dr. Armida Lander, Medical Director of Cardiac Rehab. Continue with ITP unless changes are made by physician.       Comments: 30 Day Review

## 2023-12-23 ENCOUNTER — Encounter (HOSPITAL_COMMUNITY)
Admission: RE | Admit: 2023-12-23 | Discharge: 2023-12-23 | Disposition: A | Discharge status: Home / Self Care | Source: Ambulatory Visit | Attending: Cardiovascular Disease | Admitting: Cardiovascular Disease

## 2023-12-23 DIAGNOSIS — I214 Non-ST elevation (NSTEMI) myocardial infarction: Secondary | ICD-10-CM | POA: Diagnosis not present

## 2023-12-23 NOTE — Progress Notes (Signed)
 Daily Session Note  Patient Details  Name: ARNELLE NALE MRN: [id number] Date of Birth: 02/23/1941 Referring Provider:   Flowsheet Row CARDIAC REHAB PHASE II ORIENTATION from 10/31/2023 in [facility] CARDIAC REHABILITATION  Referring Provider Fransico Ivy MD  [name] MD]    Encounter Date: 12/23/2023  Check In:  Session Check In - 12/23/23 1430       Check-In   Supervising physician immediately available to respond to emergencies See telemetry face sheet for immediately available MD    Location [facility]    Staff Present Clotilda Danish, BS, Exercise Physiologist;Brittany Annette Barters, BSN, RN, WTA-C;Other    Virtual Visit No    Medication changes reported     No    Fall or balance concerns reported    No    Tobacco Cessation No Change    Warm-up and Cool-down Performed on first and last piece of equipment    Resistance Training Performed Yes    VAD Patient? No    PAD/SET Patient? No      Pain Assessment   Currently in Pain? No/denies    Pain Score 0-No pain    Multiple Pain Sites No          Capillary Blood Glucose: No results found for this or any previous visit (from the past 24 hours).    Social History   Tobacco Use  Smoking Status Former   Types: Cigarettes   Passive exposure: Never  Smokeless Tobacco Never  Tobacco Comments   Smoke 1-1 1/2 packs a day    Goals Met:  Independence with exercise equipment Exercise tolerated well No report of concerns or symptoms today Strength training completed today  Goals Unmet:  Not Applicable  Comments: Pt able to follow exercise prescription today without complaint.  Will continue to monitor for progression.

## 2023-12-26 ENCOUNTER — Encounter (HOSPITAL_COMMUNITY)
Admission: RE | Admit: 2023-12-26 | Discharge: 2023-12-26 | Disposition: A | Discharge status: Home / Self Care | Source: Ambulatory Visit | Attending: Cardiovascular Disease | Admitting: Cardiovascular Disease

## 2023-12-26 DIAGNOSIS — I214 Non-ST elevation (NSTEMI) myocardial infarction: Secondary | ICD-10-CM

## 2023-12-26 NOTE — Progress Notes (Signed)
 Daily Session Note  Patient Details  Name: [name] MRN: [id number] Date of Birth: 1940/07/26 Referring Provider:   Flowsheet Row CARDIAC REHAB PHASE II ORIENTATION from 10/31/2023 in [facility] CARDIAC REHABILITATION  Referring Provider Elmira Penman MD  Savilla Riggs MD]    Encounter Date: 12/26/2023  Check In:  Session Check In - 12/26/23 1427       Check-In   Supervising physician immediately available to respond to emergencies See telemetry face sheet for immediately available MD    Location [facility]    Staff Present Laymon Rattler, BSN, RN, WTA-C;Heather Con, BS, Exercise Physiologist;Phyllis Billingsley, RN    Virtual Visit No    Medication changes reported     No    Fall or balance concerns reported    No    Tobacco Cessation No Change    Warm-up and Cool-down Performed on first and last piece of equipment    Resistance Training Performed Yes    VAD Patient? No    PAD/SET Patient? No      Pain Assessment   Currently in Pain? No/denies          Capillary Blood Glucose: No results found for this or any previous visit (from the past 24 hours).    Social History   Tobacco Use  Smoking Status Former   Types: Cigarettes   Passive exposure: Never  Smokeless Tobacco Never  Tobacco Comments   Smoke 1-1 1/2 packs a day    Goals Met:  Independence with exercise equipment Exercise tolerated well No report of concerns or symptoms today Strength training completed today  Goals Unmet:  Not Applicable  Comments: Pt able to follow exercise prescription today without complaint.  Will continue to monitor for progression.

## 2023-12-27 ENCOUNTER — Encounter

## 2023-12-28 ENCOUNTER — Encounter (HOSPITAL_COMMUNITY)
Admission: RE | Admit: 2023-12-28 | Discharge: 2023-12-28 | Disposition: A | Discharge status: Home / Self Care | Source: Ambulatory Visit | Attending: Cardiovascular Disease | Admitting: Cardiovascular Disease

## 2023-12-28 DIAGNOSIS — I214 Non-ST elevation (NSTEMI) myocardial infarction: Secondary | ICD-10-CM

## 2023-12-28 NOTE — Progress Notes (Signed)
 Daily Session Note  Patient Details  Name: ZAILEY AUDIA MRN: [id number] Date of Birth: 19-Dec-1940 Referring Provider:   Flowsheet Row CARDIAC REHAB PHASE II ORIENTATION from 10/31/2023 in [facility] CARDIAC REHABILITATION  Referring Provider Elmira Penman MD  Savilla Riggs MD]    Encounter Date: 12/28/2023  Check In:  Session Check In - 12/28/23 1412       Check-In   Supervising physician immediately available to respond to emergencies See telemetry face sheet for immediately available MD    Location [facility]    Staff Present Laymon Rattler, BSN, RN, WTA-C;Heather Con, BS, Exercise Physiologist    Medication changes reported     No    Fall or balance concerns reported    No    Tobacco Cessation No Change    Warm-up and Cool-down Performed on first and last piece of equipment    Resistance Training Performed Yes    VAD Patient? No    PAD/SET Patient? No      Pain Assessment   Currently in Pain? No/denies          Capillary Blood Glucose: No results found for this or any previous visit (from the past 24 hours).    Social History   Tobacco Use  Smoking Status Former   Types: Cigarettes   Passive exposure: Never  Smokeless Tobacco Never  Tobacco Comments   Smoke 1-1 1/2 packs a day    Goals Met:  Independence with exercise equipment Exercise tolerated well No report of concerns or symptoms today Strength training completed today  Goals Unmet:  Not Applicable  Comments: Pt able to follow exercise prescription today without complaint.  Will continue to monitor for progression.

## 2023-12-30 ENCOUNTER — Encounter (HOSPITAL_COMMUNITY)
Admission: RE | Admit: 2023-12-30 | Discharge: 2023-12-30 | Disposition: A | Discharge status: Home / Self Care | Source: Ambulatory Visit | Attending: Cardiovascular Disease | Admitting: Cardiovascular Disease

## 2023-12-30 DIAGNOSIS — I214 Non-ST elevation (NSTEMI) myocardial infarction: Secondary | ICD-10-CM

## 2023-12-30 NOTE — Progress Notes (Signed)
 Daily Session Note  Patient Details  Name: [name] MRN: [id number] Date of Birth: 1941/06/12 Referring Provider:   Flowsheet Row CARDIAC REHAB PHASE II ORIENTATION from 10/31/2023 in [facility] CARDIAC REHABILITATION  Referring Provider Elmira Penman MD  Savilla Riggs MD]    Encounter Date: 12/30/2023  Check In:  Session Check In - 12/30/23 1415       Check-In   Supervising physician immediately available to respond to emergencies See telemetry face sheet for immediately available MD    Location [facility]    Staff Present Adrien Louder, RN, BSN;Heather Con, BS, Exercise Physiologist;Brittany Jackquline, BSN, RN, WTA-C    Virtual Visit No    Medication changes reported     No    Fall or balance concerns reported    No    Warm-up and Cool-down Performed on first and last piece of equipment    Resistance Training Performed Yes    VAD Patient? No    PAD/SET Patient? No      Pain Assessment   Currently in Pain? No/denies    Pain Score 0-No pain    Multiple Pain Sites No          Capillary Blood Glucose: No results found for this or any previous visit (from the past 24 hours).    Social History   Tobacco Use  Smoking Status Former   Types: Cigarettes   Passive exposure: Never  Smokeless Tobacco Never  Tobacco Comments   Smoke 1-1 1/2 packs a day    Goals Met:  Independence with exercise equipment Exercise tolerated well No report of concerns or symptoms today Strength training completed today  Goals Unmet:  Not Applicable  Comments: Pt able to follow exercise prescription today without complaint.  Will continue to monitor for progression.

## 2024-01-02 ENCOUNTER — Ambulatory Visit

## 2024-01-02 ENCOUNTER — Encounter (HOSPITAL_COMMUNITY)
Admission: RE | Admit: 2024-01-02 | Discharge: 2024-01-02 | Disposition: A | Discharge status: Home / Self Care | Source: Ambulatory Visit | Attending: Cardiovascular Disease | Admitting: Cardiovascular Disease

## 2024-01-02 DIAGNOSIS — I214 Non-ST elevation (NSTEMI) myocardial infarction: Secondary | ICD-10-CM | POA: Diagnosis not present

## 2024-01-02 LAB — GLUCOSE, CAPILLARY: Glucose-Capillary: 126 mg/dL — ABNORMAL HIGH (ref 70–99)

## 2024-01-02 NOTE — Progress Notes (Signed)
 Daily Session Note  Patient Details  Name: CLIO GERHART MRN: [id number] Date of Birth: Oct 21, 1940 Referring Provider:   Flowsheet Row CARDIAC REHAB PHASE II ORIENTATION from 10/31/2023 in [facility] C[facility]IAC REHABILITATION  Referring Provider Elmira Penman MD  Savilla Riggs MD]    Encounter Date: 01/02/2024  Check In:  Session Check In - 01/02/24 1414       Check-In   Supervising physician immediately available to respond to emergencies See telemetry face sheet for immediately available MD    Location [facility]    Staff Present Powell Benders, BS, Exercise Physiologist;Brittany Jackquline, BSN, RN, WTA-C    Virtual Visit No    Medication changes reported     No    Fall or balance concerns reported    No    Tobacco Cessation No Change    Warm-up and Cool-down Performed on first and last piece of equipment    Resistance Training Performed Yes    VAD Patient? No    PAD/SET Patient? No      Pain Assessment   Currently in Pain? No/denies    Pain Score 0-No pain    Multiple Pain Sites No          Capillary Blood Glucose: Results for orders placed or performed during the hospital encounter of 01/02/24 (from the past 24 hours)  Glucose, capillary     Status: Abnormal   Collection Time: 01/02/24  2:09 PM  Result Value Ref Range   Glucose-Capillary 126 (H) 70 - 99 mg/dL      Social History   Tobacco Use  Smoking Status Former   Types: Cigarettes   Passive exposure: Never  Smokeless Tobacco Never  Tobacco Comments   Smoke 1-1 1/2 packs a day    Goals Met:  Independence with exercise equipment Exercise tolerated well No report of concerns or symptoms today Strength training completed today  Goals Unmet:  Not Applicable  Comments: Pt able to follow exercise prescription today without complaint.  Will continue to monitor for progression.

## 2024-01-03 NOTE — Progress Notes (Signed)
 [facility] - Clinic Note  01/12/2024     CHIEF COMPLAINT Patient presents for Retina Follow Up  HISTORY OF PRESENT ILLNESS: [name] is a 83 y.o. female who presents to the clinic today for:  HPI     Retina Follow Up   Patient presents with  Wet AMD.  In right eye.  This started 16 weeks ago.  I, the attending physician,  performed the HPI with the patient and updated documentation appropriately.        Comments   Patient here for 16 weeks retina follow up for exu ARMD OD. Patient states vision some days better than other days. No eye pain. Had a heart attach April 4 th. Went to hospital. Blockage was cleaned out. Finished therapy yesterday.      Last edited by Valdemar Rogue, MD on 01/12/2024  2:03 PM.     Pt states she had a heart attack in April and had bypass sx, she has been in cardiac rehab since then, but finished yesterday   Referring physician: Marvine Rush, MD [address] [place],  [place] [address]  HISTORICAL INFORMATION:  Selected notes from the MEDICAL RECORD NUMBER Referred by Dr. Oneil Kawasaki for concern of SRF OD LEE: 11.27.20 (M. Cotter) [BCVA: OD: 20/80-- OS: 20/60-]  Ocular Hx-glaucoma (latanoprost )  PMH-DM    CURRENT MEDICATIONS: Current Outpatient Medications (Ophthalmic Drugs)  Medication Sig   dorzolamide -timolol  (COSOPT ) 22.3-6.8 MG/ML ophthalmic solution INSTILL 1 DROP INTO RIGHT EYE TWICE A DAY   latanoprost  (XALATAN ) 0.005 % ophthalmic solution Place 1 drop into the left eye in the morning and at bedtime.   No current facility-administered medications for this visit. (Ophthalmic Drugs)   Current Outpatient Medications (Other)  Medication Sig   albuterol  (PROVENTIL  HFA;VENTOLIN  HFA) 108 (90 BASE) MCG/ACT inhaler Inhale 2 puffs into the lungs every 4 (four) hours as needed for shortness of breath.   albuterol  (PROVENTIL ) (2.5 MG/3ML) 0.083% nebulizer solution Take 2.5 mg by nebulization every 6 (six) hours as  needed for wheezing or shortness of breath.   amitriptyline  (ELAVIL ) 25 MG tablet Take 25 mg by mouth at bedtime.   anastrozole  (ARIMIDEX ) 1 MG tablet Take 1 tablet (1 mg total) by mouth at bedtime.   aspirin  EC 81 MG tablet Take 1 tablet (81 mg total) by mouth daily. Swallow whole.   carvedilol  (COREG ) 12.5 MG tablet Take 1 tablet (12.5 mg total) by mouth 2 (two) times daily.   FARXIGA  5 MG TABS tablet Take 5 mg by mouth every morning.   gabapentin  (NEURONTIN ) 300 MG capsule Take 300 mg by mouth 3 (three) times daily. 9am, 1pm & 9pm   hydrALAZINE  (APRESOLINE ) 50 MG tablet Take 1 tablet (50 mg total) by mouth 3 (three) times daily.   isosorbide  mononitrate (IMDUR ) 120 MG 24 hr tablet Take 1 tablet (120 mg total) by mouth daily.   Lancets (ONETOUCH DELICA PLUS LANCET30G) MISC at bedtime.   linagliptin  (TRADJENTA ) 5 MG TABS tablet Take 5 mg by mouth at bedtime.   meclizine  (ANTIVERT ) 25 MG tablet Take 25 mg by mouth every 6 (six) hours as needed for dizziness.   Multiple Vitamins-Minerals (PRESERVISION AREDS 2 PO) Take 1 capsule by mouth in the morning and at bedtime.   NON FORMULARY Take 1 each by mouth at bedtime. CBD Gummy   olmesartan (BENICAR) 40 MG tablet Take 40 mg by mouth daily.   Omega-3 Fatty Acids (FISH OIL) 1000 MG CAPS Take 1,000 mg by  mouth 2 (two) times daily. 9am & 9pm   ONETOUCH ULTRA test strip 1 each by Other route at bedtime.   warfarin (COUMADIN ) 2 MG tablet TAKE 1 TABLET BY MOUTH EVERY DAY AT BEDTIME OR AS DIRECTED BY THE COUMADIN  CLINIC   Alirocumab  (PRALUENT ) 75 MG/ML SOAJ Inject 1 mL (75 mg total) into the skin every 14 (fourteen) days. (Patient not taking: Reported on 01/12/2024)   pantoprazole  (PROTONIX ) 40 MG tablet Take 1 tablet (40 mg total) by mouth daily.   No current facility-administered medications for this visit. (Other)   REVIEW OF SYSTEMS: ROS   Positive for: Genitourinary, Endocrine, Cardiovascular, Eyes Negative for: Constitutional, Gastrointestinal,  Neurological, Skin, Musculoskeletal, HENT, Respiratory, Psychiatric, Allergic/Imm, Heme/Lymph Last edited by Orval Asberry RAMAN, COA on 01/12/2024  7:50 AM.      ALLERGIES Allergies  Allergen Reactions   Alphagan  [Brimonidine ] Itching   Diflunisal Swelling    Other reaction(s): ENTIRE BODY SWELLING   Vioxx [Rofecoxib] Shortness Of Breath   Metformin And Related     Kidney failure   Nexlizet  [Bempedoic Acid-Ezetimibe]     Causes elevated Liver and Kidney function   Repatha  [Evolocumab ]     MYALGIAS   Codeine Rash   Elemental Sulfur Rash   Motrin [Ibuprofen] Rash   Penicillins Rash   Pravastatin  Rash   PAST MEDICAL HISTORY Past Medical History:  Diagnosis Date   Antral gastritis    EGD 11/15   Arthritis    Asthmatic bronchitis    Back pain    Breast cancer (HCC)    right breast   CAD in native artery 03/10/2021   Chronic diastolic heart failure (HCC) 05/20/2015   Grade 2 diastolic dysfunction.  04/2015.   Chronic kidney disease    kidney function low   Chronic stable angina (HCC) 12/16/2023   COPD (chronic obstructive pulmonary disease) (HCC)    Diabetes mellitus    x 5 yrs   DVT of axillary vein, acute left (HCC) 07/24/2012   GERD (gastroesophageal reflux disease)    Glaucoma    POAG OU   Heart murmur    rheum fever at age 33   History of hiatal hernia    History of kidney stones    Hyperlipidemia 05/20/2015   Hypertension    Hypertensive retinopathy    OU   Hypothyroidism    Kidney stones    Macular degeneration    Wet OD, Dry OS   Mixed hyperlipidemia    OSA (obstructive sleep apnea) 12/17/2021   does not use cpap on regular basis   Peripheral venous insufficiency    Pinched nerve    right elbow   Pneumonia    PONV (postoperative nausea and vomiting)    Sigmoid diverticulitis    Snoring 03/10/2021   Vertigo    chonic   Past Surgical History:  Procedure Laterality Date   ABDOMINAL HYSTERECTOMY     BACK SURGERY     spinal    BREAST LUMPECTOMY  WITH RADIOACTIVE SEED LOCALIZATION Right 12/03/2020   Procedure: RIGHT BREAST LUMPECTOMY WITH RADIOACTIVE SEED LOCALIZATION;  Surgeon: Belinda Cough, MD;  Location: [facility] OR;  Service: General;  Laterality: Right;   CARDIAC CATHETERIZATION N/A 05/26/2015   Procedure: Left Heart Cath and Coronary Angiography;  Surgeon: Candyce RAMAN Reek, MD;  Location: [facility] INVASIVE CV LAB;  Service: Cardiovascular;  Laterality: N/A;   CATARACT EXTRACTION Bilateral    CHOLECYSTECTOMY     COLON SURGERY     COLONOSCOPY N/A 12/27/2013   Procedure:  COLONOSCOPY;  Surgeon: Claudis RAYMOND Rivet, MD;  Location: [facility] ENDO SUITE;  Service: Endoscopy;  Laterality: N/A;  200   COLOSTOMY CLOSURE     CYSTOSCOPY/URETEROSCOPY/HOLMIUM LASER/STENT PLACEMENT Right 11/24/2022   Procedure: CYSTOSCOPY RIGHT URETEROSCOPY/HOLMIUM LASER/STENT PLACEMENT;  Surgeon: Carolee Sherwood JONETTA DOUGLAS, MD;  Location: [facility] ORS;  Service: Urology;  Laterality: Right;  60 MINS FOR CASE   ENDARTERECTOMY Left 03/10/2022   Procedure: LEFT CAROTID ENDARTERECTOMY;  Surgeon: Magda Debby SAILOR, MD;  Location[facility] OR;  Service: Vascular;  Laterality: Left;   ESOPHAGOGASTRODUODENOSCOPY N/A 05/07/2014   Procedure: ESOPHAGOGASTRODUODENOSCOPY (EGD);  Surgeon: Claudis RAYMOND Rivet, MD;  Location: [facility] SUITE;  Service: Endoscopy;  Laterality: N/A;   EYE SURGERY Bilateral    Cat Sx   fracture left foot     HERNIA REPAIR     LEFT HEART CATH AND CORONARY ANGIOGRAPHY N/A 10/11/2023   Procedure: LEFT HEART CATH AND CORONARY ANGIOGRAPHY;  Surgeon: Elmira Newman PARAS, MD;  Location: [facility] INVASIVE CV LAB;  Service: Cardiovascular;  Laterality: N/A;   NM MYOCAR PERF WALL MOTION  01/28/2009   Normal   OTHER SURGICAL HISTORY     colostomy, colostomy reversal, for diverticulitis surgical hernia repair, arm surgery, neck surgery   PATCH ANGIOPLASTY Left 03/10/2022   Procedure: PATCH ANGIOPLASTY WITH 1X6CM GEORGE PATCH;  Surgeon: Magda Debby SAILOR, MD;  Location: [facility] OR;  Service: Vascular;  Laterality:  Left;   US  ECHOCARDIOGRAPHY  02/11/2010   Mild MR,trace TR & AI   FAMILY HISTORY Family History  Problem Relation Age of Onset   CVA Maternal Grandmother 42       deceased   Heart disease Maternal Grandmother    Stroke Maternal Grandmother    Breast cancer Maternal Grandmother    Other Mother 76       Cause unknown   Cancer Mother        liver   Heart attack Brother 31       deceased   Breast cancer Sister    Leukemia Maternal Aunt    Glaucoma Maternal Uncle    Bone cancer Maternal Uncle    Spina bifida Daughter    SOCIAL HISTORY Social History   Tobacco Use   Smoking status: Former    Types: Cigarettes    Passive exposure: Never   Smokeless tobacco: Never   Tobacco comments:    Smoke 1-1 1/2 packs a day  Vaping Use   Vaping status: Never Used  Substance Use Topics   Alcohol  use: No   Drug use: No       OPHTHALMIC EXAM: Base Eye Exam     Visual Acuity (Snellen - Linear)       Right Left   Dist [place] 20/50 20/30   Dist ph [place] 20/40 -2 NI         Tonometry (Tonopen, 7:48 AM)       Right Left   Pressure 10 18         Pupils       Dark Light Shape React APD   Right 2 1 Round Brisk None   Left 2 1 Round Brisk None         Visual Fields (Counting fingers)       Left Right    Full Full         Extraocular Movement       Right Left    Full, Ortho Full, Ortho         Neuro/Psych  Oriented x3: Yes   Mood/Affect: Normal         Dilation     Both eyes: 1.0% Mydriacyl, 2.5% Phenylephrine  @ 7:48 AM           Slit Lamp and Fundus Exam     Slit Lamp Exam       Right Left   Lids/Lashes Dermatochalasis - upper lid, mild Meibomian gland dysfunction Dermatochalasis - upper lid, Telangiectasia, mild Meibomian gland dysfunction   Conjunctiva/Sclera White and quiet, superior bleb White and quiet   Cornea 1+Punctate epethelial erosions, well healed cataract wound 1+ fine Punctate epithelial erosions, arcus, mild EBMD, well healed  cataract wound   Anterior Chamber narrow temporal angle, tube at 1200 Deep and quiet, narrow temporal angle   Iris round and mod dilated Round and moderately dilated to 5.51mm   Lens Posterior chamber intraocular lens, open PC Posterior chamber intraocular lens, trace Posterior capsular opacification   Anterior Vitreous Vitreous syneresis, Posterior vitreous detachment Vitreous syneresis         Fundus Exam       Right Left   Disc Sharp rim, +Pallor, +cupping w/ superior and inferior rim thinning, temporal Peripapillary atrophy mild Pallor, Sharp rim, temporal Peripapillary atrophy   C/D Ratio 0.9 0.5   Macula Flat, Blunted foveal reflex, +focal CNV/PED nasal macula with partial pigment ring, trace IRF/ SRF -- stably improved, Drusen, RPE mottling and clumping, no heme Flat, Blunted foveal reflex, fine drusen, mild ERM, Retinal pigment epithelial mottling and clumping, No heme or edema   Vessels attenuated, Tortuous attenuated, Tortuous   Periphery Attached, mild reticular degeneration, No heme, No RT/RD Attached; no heme           IMAGING AND PROCEDURES  Imaging and Procedures for @TODAY @  OCT, Retina - OU - Both Eyes       Right Eye Quality was good. Central Foveal Thickness: 196. Progression has been stable. Findings include normal foveal contour, retinal drusen , outer retinal tubulation, subretinal hyper-reflective material, intraretinal hyper-reflective material, intraretinal fluid, pigment epithelial detachment, subretinal fluid, outer retinal atrophy (Stable improvement in focal IRF / SRF overlying nasal PED / CNV, partial PVD).   Left Eye Quality was good. Central Foveal Thickness: 202. Progression has been stable. Findings include normal foveal contour, no IRF, no SRF, retinal drusen (Partial PVD, patchy ORA).   Notes *Images captured and stored on drive  Diagnosis / Impression:  OD: exudative ARMD -- Stable improvement in focal IRF / SRF overlying nasal PED / CNV,  partial PVD OS: NFP, no IRF/SRF; +drusen -- nonexudative ARMD  Clinical management:  See below  Abbreviations: NFP - Normal foveal profile. CME - cystoid macular edema. PED - pigment epithelial detachment. IRF - intraretinal fluid. SRF - subretinal fluid. EZ - ellipsoid zone. ERM - epiretinal membrane. ORA - outer retinal atrophy. ORT - outer retinal tubulation. SRHM - subretinal hyper-reflective material      Intravitreal Injection, Pharmacologic Agent - OD - Right Eye       Time Out 01/12/2024. 8:22 AM. Confirmed correct patient, procedure, site, and patient consented.   Anesthesia Topical anesthesia was used. Anesthetic medications included Lidocaine  2%, Proparacaine 0.5%.   Procedure Preparation included 5% betadine to ocular surface, eyelid speculum. A supplied (32g) needle was used.   Injection: 1.25 mg Bevacizumab  1.25mg /0.91ml   Route: Intravitreal, Site: Right Eye   NDC: [id number], Lot: [id number], Expiration date: 01/30/2024   Post-op Post injection exam found visual acuity of at least counting fingers. The patient  tolerated the procedure well. There were no complications. The patient received written and verbal post procedure care education. Post injection medications were not given.             ASSESSMENT/PLAN:   ICD-10-CM   1. Exudative age-related macular degeneration of right eye with active choroidal neovascularization (HCC)  H35.3211 OCT, Retina - OU - Both Eyes    Intravitreal Injection, Pharmacologic Agent - OD - Right Eye    Bevacizumab  (AVASTIN ) SOLN 1.25 mg    2. Intermediate stage nonexudative age-related macular degeneration of left eye  H35.3122     3. Diabetes mellitus type 2 without retinopathy (HCC)  E11.9     4. Long term (current) use of oral hypoglycemic drugs  Z79.84     5. Essential hypertension  I10     6. Hypertensive retinopathy of both eyes  H35.033     7. Pseudophakia of both eyes  Z96.1     8. Primary open angle glaucoma of both  eyes, unspecified glaucoma stage  H40.1130      1. 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, OD  - delayed f/u from 12 wks to 19 on 03.20.24  - h/o delayed f/u from 8 wks to 12 on 6.20.22 due to lumpectomy -- dx'd w/ DCIS - h/o delayed follow up from 4 weeks to 8 weeks due to passing of husband (12.11.20-02.12.21)  - s/p IVA OD #1 (12.11.20), #2 (02.12.21), #3 (03.12.21), #4 (04.09.21), #5 (05.14.21), #6 (06.17.21), #7 (07.23.21), #8 (10.15.21), #9 (12.7.21), #10 (03.22.22), #11 (06.20.22), #12 (08.24.22), #13 (10.31.22), #14 (01.03.23), #15 (08.15.23), #16 (11.15.23), #17 (02.29.24), #18 (03.29.24), #19 (05.09.24), #20 (06.20.24), #21 (08.15.24), #22 (10.10.24), #23 (12.19.24), #24 (03.20.25)  - FA 12.11.20 confirms +CNVM  **history of increased fluid at 7+ months noted on 08.15.23** - OCT OD shows stable improvement in focal IRF/SRF overlying nasal PED / CNV at 16 weeks  - BCVA OD 20/40 - stable  - recommend IVA OD #25 today, 07.10.25 with follow up in 16 weeks  - pt wishes to proceed with injection  - RBA of procedure discussed, questions answered - informed consent obtained and signed - see procedure note  - Avastin  informed consent form re-signed and scanned on 08.15.2023 (OD) - Continue to use AT OD  - f/u 16 weeks -- DFE/OCT/possible injection  2. Age related macular degeneration, non-exudative, OS  - intermediate stage  - BCVA 20/25 - The incidence, anatomy, and pathology of dry AMD, risk of progression, and the AREDS and AREDS 2 study including smoking risks discussed with patient.   - recommend Amsler grid monitoring  3,4. Diabetes mellitus, type 2 without retinopathy - The incidence, risk factors for progression, natural history and treatment options for diabetic retinopathy  were discussed with patient.   - The need for close monitoring of blood glucose, blood pressure, and serum lipids, avoiding cigarette or any type of tobacco, and the need for long term follow up was  also discussed with patient.  - monitor  5,6. Hypertensive retinopathy OU  - discussed importance of tight BP control  - monitor  7. Pseudophakia OU, PCO OD  - s/p CE/IOL OU (Dr. Cyrus)  - IOL in good position  - s/p yag cap OD (04.06.22) -- good PC opening  - monitor  8. POAG OU  - formerly managed by Dr. Cyrus -- now following at [facility] ([facility])  - s/p laser w/ Dr. Cyrus -- ?SLT  - s/p trab OD w/ Dr. KYM Gaudy  -  IOP 10, 18  - re-started Cosopt  per Dr. Octavia on 03.17.25  - currently on latanoprost   Ophthalmic Meds Ordered this visit:  Meds ordered this encounter  Medications   Bevacizumab  (AVASTIN ) SOLN 1.25 mg     Return in about 16 weeks (around 05/03/2024) for f/u exu ARMD OD, DFE, OCT, Possible Injxn.  There are no Patient Instructions on file for this visit.  This document serves as a record of services personally performed by Redell JUDITHANN Hans, MD, PhD. It was created on their behalf by Alan PARAS. Delores, OA an ophthalmic technician. The creation of this record is the provider's dictation and/or activities during the visit.    Electronically signed by: Alan PARAS. Delores, OA 01/12/24 10:36 PM   Redell JUDITHANN Hans, M.D., Ph.D. Diseases & Surgery of the Retina and Vitreous [facility]  I have reviewed the above documentation for accuracy and completeness, and I agree with the above. Redell JUDITHANN Hans, M.D., Ph.D. 01/12/24 10:37 PM    Abbreviations: M myopia (nearsighted); A astigmatism; H hyperopia (farsighted); P presbyopia; Mrx spectacle prescription;  CTL contact lenses; OD right eye; OS left eye; OU both eyes  XT exotropia; ET esotropia; PEK punctate epithelial keratitis; PEE punctate epithelial erosions; DES dry eye syndrome; MGD meibomian gland dysfunction; ATs artificial tears; PFAT's preservative free artificial tears; NSC nuclear sclerotic cataract; PSC posterior subcapsular cataract; ERM epi-retinal membrane; PVD posterior vitreous  detachment; RD retinal detachment; DM diabetes mellitus; DR diabetic retinopathy; NPDR non-proliferative diabetic retinopathy; PDR proliferative diabetic retinopathy; CSME clinically significant macular edema; DME diabetic macular edema; dbh dot blot hemorrhages; CWS cotton wool spot; POAG primary open angle glaucoma; C/D cup-to-disc ratio; HVF humphrey visual field; GVF goldmann visual field; OCT optical coherence tomography; IOP intraocular pressure; BRVO Branch retinal vein occlusion; CRVO central retinal vein occlusion; CRAO central retinal artery occlusion; BRAO branch retinal artery occlusion; RT retinal tear; SB scleral buckle; PPV pars plana vitrectomy; VH Vitreous hemorrhage; PRP panretinal laser photocoagulation; IVK intravitreal kenalog ; VMT vitreomacular traction; MH Macular hole;  NVD neovascularization of the disc; NVE neovascularization elsewhere; AREDS age related eye disease study; ARMD age related macular degeneration; POAG primary open angle glaucoma; EBMD epithelial/anterior basement membrane dystrophy; ACIOL anterior chamber intraocular lens; IOL intraocular lens; PCIOL posterior chamber intraocular lens; Phaco/IOL phacoemulsification with intraocular lens placement; PRK photorefractive keratectomy; LASIK laser assisted in situ keratomileusis; HTN hypertension; DM diabetes mellitus; COPD chronic obstructive pulmonary disease

## 2024-01-04 ENCOUNTER — Ambulatory Visit: Attending: Cardiology | Admitting: *Deleted

## 2024-01-04 ENCOUNTER — Encounter (HOSPITAL_COMMUNITY)
Admission: RE | Admit: 2024-01-04 | Discharge: 2024-01-04 | Disposition: A | Discharge status: Home / Self Care | Source: Ambulatory Visit | Attending: Cardiovascular Disease | Admitting: Cardiovascular Disease

## 2024-01-04 VITALS — Ht 62.0 in | Wt 158.7 lb

## 2024-01-04 DIAGNOSIS — I82A12 Acute embolism and thrombosis of left axillary vein: Secondary | ICD-10-CM | POA: Diagnosis not present

## 2024-01-04 DIAGNOSIS — Z5181 Encounter for therapeutic drug level monitoring: Secondary | ICD-10-CM

## 2024-01-04 DIAGNOSIS — I214 Non-ST elevation (NSTEMI) myocardial infarction: Secondary | ICD-10-CM | POA: Insufficient documentation

## 2024-01-04 LAB — POCT INR: INR: 5.3 — AB (ref 2.0–3.0)

## 2024-01-04 NOTE — Progress Notes (Signed)
Please see anticoagulation encounter.

## 2024-01-04 NOTE — Patient Instructions (Signed)
 Hold warfarin tonight and tomorrow night, take 1 tablet Friday then resume 2 tablets daily except 1 tablet on Mondays and Thursdays Recheck INR in 1 wk Been taking OTC cold medicine Denies any S/S of bleeding.  Bleeding and fall precautions discussed with pt and she verbalized understanding.

## 2024-01-04 NOTE — Progress Notes (Signed)
 Daily Session Note  Patient Details  Name: [name] MRN: [id number] Date of Birth: 10/04/1940 Referring Provider:   Flowsheet Row CARDIAC REHAB PHASE II ORIENTATION from 10/31/2023 in [facility] CARD[facility]AC [facility]ILITATION  Referring Provider Elmira Penman MD  Savilla Riggs MD]    Encounter Date: 01/04/2024  Check In:  Session Check In - 01/04/24 1421       Check-In   Supervising physician immediately available to respond to emergencies See telemetry face sheet for immediately available MD    Location [facility]    Staff Present Rolland Sake BSN, RN;Heather Con, [place], Exercise Physiologist    Virtual Visit No    Medication changes reported     No    Fall or balance concerns reported    No    Tobacco Cessation No Change    Warm-up and Cool-down Performed on first and last piece of equipment    Resistance Training Performed Yes    VAD Patient? No    PAD/SET Patient? No      Pain Assessment   Currently in Pain? No/denies    Pain Score 0-No pain    Multiple Pain Sites No          Capillary Blood Glucose: Results for orders placed or performed in visit on 01/04/24 (from the past 24 hours)  POCT INR     Status: Abnormal   Collection Time: 01/04/24  9:46 AM  Result Value Ref Range   INR 5.3 (A) 2.0 - 3.0   POC INR        Social History   Tobacco Use  Smoking Status Former   Types: Cigarettes   Passive exposure: Never  Smokeless Tobacco Never  Tobacco Comments   Smoke 1-1 1/2 packs a day    Goals Met:  Independence with exercise equipment Exercise tolerated well No report of concerns or symptoms today Strength training completed today  Goals Unmet:  Not Applicable  Comments: .Pt able to follow exercise prescription today without complaint.  Will continue to monitor for progression.

## 2024-01-04 NOTE — Patient Instructions (Signed)
 Discharge Patient Instructions  Patient Details  Name: [name] MRN: [id number] Date of Birth: 19-Jun-1941 Referring Provider:  Marvine Rush, MD   Number of Visits: 20  Reason for Discharge:  Patient reached a stable level of exercise. Patient independent in their exercise. Patient has met program and personal goals.  Diagnosis:  NSTEMI (non-ST elevated myocardial infarction) [facility])  Initial Exercise Prescription:  Initial Exercise Prescription - 10/31/23 1200       Date of Initial Exercise RX and Referring Provider   Date 10/31/23    Referring Provider Elmira Penman MD   Raford Riggs MD     NuStep   Level 3    SPM 50    Minutes 15    METs 2      REL-XR   Level 2    Speed 60    Minutes 15    METs 2      Prescription Details   Frequency (times per week) 3    Duration Progress to 30 minutes of continuous aerobic without signs/symptoms of physical distress      Intensity   THRR 40-80% of Max Heartrate 91-122    Ratings of Perceived Exertion 11-13    Perceived Dyspnea 0-4      Resistance Training   Training Prescription Yes    Weight 4    Reps 10-15          Discharge Exercise Prescription (Final Exercise Prescription Changes):  Exercise Prescription Changes - 12/26/23 1500       Response to Exercise   Blood Pressure (Admit) 142/62    Blood Pressure (Exit) 110/60    Heart Rate (Admit) 55 bpm    Heart Rate (Exercise) 100 bpm    Heart Rate (Exit) 79 bpm    Rating of Perceived Exertion (Exercise) 11    Duration Continue with 30 min of aerobic exercise without signs/symptoms of physical distress.    Intensity THRR unchanged      Progression   Progression Continue to progress workloads to maintain intensity without signs/symptoms of physical distress.      Resistance Training   Training Prescription Yes    Weight 3    Reps 10-15      NuStep   Level 2    SPM 88    Minutes 15    METs 2.3      REL-XR   Level 1    Speed 57     Minutes 15    METs 3.8          Functional Capacity:  6 Minute Walk     Row Name 10/31/23 1202 01/04/24 1459       6 Minute Walk   Phase Initial Discharge    Distance 720 feet 950 feet    Distance Feet Change -- 230 ft    Walk Time 6 minutes 6 minutes    # of Rest Breaks 0 0    MPH 1.36 1.8    METS 1.1 1.25    RPE 12 12    Perceived Dyspnea  0 0    VO2 Peak 3.9 4.4    Symptoms No No    Resting HR 60 bpm 69 bpm    Resting BP 150/64 118/68    Resting Oxygen Saturation  95 % 96 %    Exercise Oxygen Saturation  during 6 min walk 96 % 96 %    Max Ex. HR 92 bpm 96 bpm    Max Ex. BP 160/60 140/60  2 Minute Post BP 158/64 120/60      Nutrition & Weight - Outcomes:  Pre Biometrics - 10/31/23 1208       Pre Biometrics   Height 5' 2 (1.575 m)    Weight 72.7 kg    Waist Circumference 37 inches    Hip Circumference 40 inches    Waist to Hip Ratio 0.93 %    BMI (Calculated) 29.31    Grip Strength 16.3 kg          Post Biometrics - 01/04/24 1500        Post  Biometrics   Height 5' 2 (1.575 m)    Weight 72 kg    Waist Circumference 37 inches    Hip Circumference 40 inches    Waist to Hip Ratio 0.93 %    BMI (Calculated) 29.02    Grip Strength 15.1 kg         Goals reviewed with patient; copy given to patient.

## 2024-01-06 ENCOUNTER — Encounter (HOSPITAL_COMMUNITY)

## 2024-01-09 ENCOUNTER — Encounter (HOSPITAL_COMMUNITY)
Admission: RE | Admit: 2024-01-09 | Discharge: 2024-01-09 | Disposition: A | Discharge status: Home / Self Care | Source: Ambulatory Visit | Attending: Cardiovascular Disease

## 2024-01-09 DIAGNOSIS — I214 Non-ST elevation (NSTEMI) myocardial infarction: Secondary | ICD-10-CM

## 2024-01-09 LAB — GLUCOSE, CAPILLARY: Glucose-Capillary: 75 mg/dL (ref 70–99)

## 2024-01-09 NOTE — Progress Notes (Signed)
 Daily Session Note  Patient Details  Name: [name] MRN: [id number] Date of Birth: 1941/05/15 Referring Provider:   Flowsheet Row CARDIAC REHAB PHASE II ORIENTATION from 10/31/2023 in [facility] CARDIAC REH[facility]ILITATION  Referring Provider Elmira Penman MD  Savilla Riggs MD]    Encounter Date: 01/09/2024  Check In:  Session Check In - 01/09/24 1424       Check-In   Supervising physician immediately available to respond to emergencies See telemetry face sheet for immediately available MD    Location [facility]    Staff Present Laymon Rattler, BSN, RN, WTA-C;Heather Con, BS, Exercise Physiologist    Virtual Visit No    Medication changes reported     No    Fall or balance concerns reported    No    Tobacco Cessation No Change    Warm-up and Cool-down Performed on first and last piece of equipment    Resistance Training Performed Yes    VAD Patient? No    PAD/SET Patient? No      Pain Assessment   Currently in Pain? No/denies          Capillary Blood Glucose: No results found for this or any previous visit (from the past 24 hours).    Social History   Tobacco Use  Smoking Status Former   Types: Cigarettes   Passive exposure: Never  Smokeless Tobacco Never  Tobacco Comments   Smoke 1-1 1/2 packs a day    Goals Met:  Independence with exercise equipment Exercise tolerated well No report of concerns or symptoms today Strength training completed today  Goals Unmet:  Not Applicable  Comments: Pt able to follow exercise prescription today without complaint.  Will continue to monitor for progression.

## 2024-01-11 ENCOUNTER — Encounter (HOSPITAL_COMMUNITY)
Admission: RE | Admit: 2024-01-11 | Discharge: 2024-01-11 | Disposition: A | Discharge status: Home / Self Care | Source: Ambulatory Visit | Attending: Cardiovascular Disease | Admitting: Cardiovascular Disease

## 2024-01-11 ENCOUNTER — Other Ambulatory Visit: Payer: Self-pay | Admitting: Cardiovascular Disease

## 2024-01-11 ENCOUNTER — Ambulatory Visit: Attending: Cardiology | Admitting: *Deleted

## 2024-01-11 DIAGNOSIS — Z5181 Encounter for therapeutic drug level monitoring: Secondary | ICD-10-CM

## 2024-01-11 DIAGNOSIS — I82A12 Acute embolism and thrombosis of left axillary vein: Secondary | ICD-10-CM

## 2024-01-11 DIAGNOSIS — I825Z9 Chronic embolism and thrombosis of unspecified deep veins of unspecified distal lower extremity: Secondary | ICD-10-CM

## 2024-01-11 DIAGNOSIS — I214 Non-ST elevation (NSTEMI) myocardial infarction: Secondary | ICD-10-CM

## 2024-01-11 LAB — POCT INR: INR: 1.5 — AB (ref 2.0–3.0)

## 2024-01-11 NOTE — Progress Notes (Signed)
Please see anticoagulation encounter.

## 2024-01-11 NOTE — Progress Notes (Signed)
 Discharge Progress Report  Patient Details  Name: [name] MRN: [id number] Date of Birth: [birth year] Referring Provider:   Flowsheet Row CARDIAC REHAB PHASE II ORIENTATION from 10/31/2023 in [facility] CARDIAC REHABILITATION  Referring Provider Elmira Penman MD  Savilla Riggs MD]     Number of Visits: 36  Reason for Discharge:  Patient reached a stable level of exercise. Patient independent in their exercise. Patient has met program and personal goals.  Smoking History:  Social History   Tobacco Use  Smoking Status Former   Types: Cigarettes   Passive exposure: Never  Smokeless Tobacco Never  Tobacco Comments   Smoke 1-1 1/2 packs a day    Diagnosis:  NSTEMI (non-ST elevated myocardial infarction) (HCC)  ADL [facility]:   Initial Exercise Prescription:  Initial Exercise Prescription - 10/31/23 1200       Date of Initial Exercise RX and Referring Provider   Date 10/31/23    Referring Provider Elmira Penman MD   Raford Riggs MD     NuStep   Level 3    SPM 50    Minutes 15    METs 2      REL-XR   Level 2    Speed 60    Minutes 15    METs 2      Prescription Details   Frequency (times per week) 3    Duration Progress to 30 minutes of continuous aerobic without signs/symptoms of physical distress      Intensity   THRR 40-80% of Max Heartrate 91-122    Ratings of Perceived Exertion 11-13    Perceived Dyspnea 0-4      Resistance Training   Training Prescription Yes    Weight 4    Reps 10-15          Discharge Exercise Prescription (Final Exercise Prescription Changes):  Exercise Prescription Changes - 12/26/23 1500       Response to Exercise   Blood Pressure (Admit) 142/62    Blood Pressure (Exit) 110/60    Heart Rate (Admit) 55 bpm    Heart Rate (Exercise) 100 bpm    Heart Rate (Exit) 79 bpm    Rating of Perceived Exertion (Exercise) 11    Duration Continue with 30 min of aerobic exercise without signs/symptoms of  physical distress.    Intensity THRR unchanged      Progression   Progression Continue to progress workloads to maintain intensity without signs/symptoms of physical distress.      Resistance Training   Training Prescription Yes    Weight 3    Reps 10-15      NuStep   Level 2    SPM 88    Minutes 15    METs 2.3      REL-XR   Level 1    Speed 57    Minutes 15    METs 3.8          Functional Capacity:  6 Minute Walk     Row Name 10/31/23 1202 01/04/24 1459       6 Minute Walk   Phase Initial Discharge    Distance 720 feet 950 feet    Distance Feet Change -- 230 ft    Walk Time 6 minutes 6 minutes    # of Rest Breaks 0 0    MPH 1.36 1.8    METS 1.1 1.25    RPE 12 12    Perceived Dyspnea  0 0    VO2 Peak  3.9 4.4    Symptoms No No    Resting HR 60 bpm 69 bpm    Resting BP 150/64 118/68    Resting Oxygen Saturation  95 % 96 %    Exercise Oxygen Saturation  during 6 min walk 96 % 96 %    Max Ex. HR 92 bpm 96 bpm    Max Ex. BP 160/60 140/60    2 Minute Post BP 158/64 120/60       Psychological, QOL, Others - Outcomes: PHQ 2/9:    12/14/2023    3:08 PM 10/31/2023   11:35 AM 09/17/2021   10:40 AM  Depression screen PHQ 2/9  Decreased Interest 0 1 1  Down, Depressed, Hopeless 1 0 1  PHQ - 2 Score 1 1 2   Altered sleeping 0 1 0  Tired, decreased energy 1 2 1   Change in appetite 2 0 0  Feeling bad or failure about yourself  1 1 0  Trouble concentrating 0 1 0  Moving slowly or fidgety/restless 0 0 0  Suicidal thoughts 0 0 0  PHQ-9 Score 5 6 3   Difficult doing work/chores Somewhat difficult      Quality of Life:  Quality of Life - 10/31/23 1211       Quality of Life   Select Quality of Life      Quality of Life Scores   Health/Function Pre 22.37 %    Socioeconomic Pre 30 %    Psych/Spiritual Pre 30 %    Family Pre 18 %    GLOBAL Pre 24.92 %          Personal Goals: Goals established at orientation with interventions provided to work toward  goal.  Personal Goals and Risk Factors at Admission - 10/28/23 1348       Core Components/Risk Factors/Patient Goals on Admission    Weight Management Weight Maintenance    Improve shortness of breath with ADL's Yes    Intervention Provide education, individualized exercise plan and daily activity instruction to help decrease symptoms of SOB with activities of daily living.    Expected Outcomes Short Term: Improve cardiorespiratory fitness to achieve a reduction of symptoms when performing ADLs;Long Term: Be able to perform more ADLs without symptoms or delay the onset of symptoms    Diabetes Yes    Intervention Provide education about signs/symptoms and action to take for hypo/hyperglycemia.;Provide education about proper nutrition, including hydration, and aerobic/resistive exercise prescription along with prescribed medications to achieve blood glucose in normal ranges: Fasting glucose 65-99 mg/dL    Expected Outcomes Short Term: Participant verbalizes understanding of the signs/symptoms and immediate care of hyper/hypoglycemia, proper foot care and importance of medication, aerobic/resistive exercise and nutrition plan for blood glucose control.;Long Term: Attainment of HbA1C < 7%.    Heart Failure Yes    Intervention Provide a combined exercise and nutrition program that is supplemented with education, support and counseling about heart failure. Directed toward relieving symptoms such as shortness of breath, decreased exercise tolerance, and extremity edema.    Expected Outcomes Improve functional capacity of life;Short term: Attendance in program 2-3 days a week with increased exercise capacity. Reported lower sodium intake. Reported increased fruit and vegetable intake. Reports medication compliance.;Short term: Daily weights obtained and reported for increase. Utilizing diuretic protocols set by physician.;Long term: Adoption of self-care skills and reduction of barriers for early signs and  symptoms recognition and intervention leading to self-care maintenance.    Hypertension Yes    Intervention Provide  education on lifestyle modifcations including regular physical activity/exercise, weight management, moderate sodium restriction and increased consumption of fresh fruit, vegetables, and low fat dairy, alcohol  moderation, and smoking cessation.;Monitor prescription use compliance.    Expected Outcomes Long Term: Maintenance of blood pressure at goal levels.;Short Term: Continued assessment and intervention until BP is < 140/58mm HG in hypertensive participants. < 130/66mm HG in hypertensive participants with diabetes, heart failure or chronic kidney disease.    Lipids Yes    Intervention Provide education and support for participant on nutrition & aerobic/resistive exercise along with prescribed medications to achieve LDL 70mg , HDL >40mg .    Expected Outcomes Short Term: Participant states understanding of desired cholesterol values and is compliant with medications prescribed. Participant is following exercise prescription and nutrition guidelines.;Long Term: Cholesterol controlled with medications as prescribed, with individualized exercise RX and with personalized nutrition plan. Value goals: LDL < 70mg , HDL > 40 mg.           Personal Goals Discharge:  Goals and Risk Factor Review     Row Name 12/14/23 1520             Core Components/Risk Factors/Patient Goals Review   Personal Goals Review Weight Management/Obesity;Hypertension;Diabetes;Heart Failure       Review [name] is doing well in rehab.  Her weight is steady.  Sugars and pressures are good and she checks them on occassion at home.  She has not noted any heart failure symptoms recently.       Expected Outcomes Short: Continue to work on weight Long: COnitnue to montior risk factors          Exercise Goals and Review:  Exercise Goals     Row Name 11/08/23 1037             Exercise Goals   Increase  Physical Activity Yes       Intervention Develop an individualized exercise prescription for aerobic and resistive training based on initial evaluation findings, risk stratification, comorbidities and participant's personal goals.;Provide advice, education, support and counseling about physical activity/exercise needs.       Expected Outcomes Long Term: Add in home exercise to make exercise part of routine and to increase amount of physical activity.;Short Term: Attend rehab on a regular basis to increase amount of physical activity.;Long Term: Exercising regularly at least 3-5 days a week.       Increase Strength and Stamina Yes       Intervention Develop an individualized exercise prescription for aerobic and resistive training based on initial evaluation findings, risk stratification, comorbidities and participant's personal goals.;Provide advice, education, support and counseling about physical activity/exercise needs.       Expected Outcomes Short Term: Increase workloads from initial exercise prescription for resistance, speed, and METs.;Short Term: Perform resistance training exercises routinely during rehab and add in resistance training at home;Long Term: Improve cardiorespiratory fitness, muscular endurance and strength as measured by increased METs and functional capacity ( )       Able to understand and use rate of perceived exertion (RPE) scale Yes       Intervention Provide education and explanation on how to use RPE scale       Expected Outcomes Short Term: Able to use RPE daily in rehab to express subjective intensity level;Long Term:  Able to use RPE to guide intensity level when exercising independently       Able to understand and use Dyspnea scale Yes       Intervention Provide education and explanation  on how to use Dyspnea scale       Expected Outcomes Short Term: Able to use Dyspnea scale daily in rehab to express subjective sense of shortness of breath during exertion;Long Term:  Able to use Dyspnea scale to guide intensity level when exercising independently       Knowledge and understanding of Target Heart Rate Range (THRR) Yes       Intervention Provide education and explanation of THRR including how the numbers were predicted and where they are located for reference       Expected Outcomes Short Term: Able to state/look up THRR;Long Term: Able to use THRR to govern intensity when exercising independently;Short Term: Able to use daily as guideline for intensity in rehab       Able to check pulse independently Yes       Intervention Provide education and demonstration on how to check pulse in carotid and radial arteries.;Review the importance of being able to check your own pulse for safety during independent exercise       Expected Outcomes Short Term: Able to explain why pulse checking is important during independent exercise;Long Term: Able to check pulse independently and accurately       Understanding of Exercise Prescription Yes       Intervention Provide education, explanation, and written materials on patient's individual exercise prescription       Expected Outcomes Short Term: Able to explain program exercise prescription;Long Term: Able to explain home exercise prescription to exercise independently          Exercise Goals Re-Evaluation:  Exercise Goals Re-Evaluation     Row Name 11/08/23 1037 12/14/23 1502           Exercise Goal Re-Evaluation   Exercise Goals Review Able to understand and use Dyspnea scale;Knowledge and understanding of Target Heart Rate Range (THRR);Able to understand and use rate of perceived exertion (RPE) scale;Understanding of Exercise Prescription Increase Physical Activity;Increase Strength and Stamina;Understanding of Exercise Prescription      Comments Reviewed RPE and dyspnea scale, THR and program prescription with pt today.  Pt voiced understanding and was given a copy of goals to take home. [name] is off to a good start in  rehab.  She has already started to notice a difference in how she is feeling.  She has noted that overall her stamina has started to pick up.  She admits to still having some days where it is a lot harder to get up and go than others.  We talked about how this is normal and will continue to improve as her stamina continues to improve as well.      Expected Outcomes Short: Use RPE daily to regulate intensity.  Long: Follow program prescription in THR. Short; Review home exercise guidelines Long; Conitnue to improve stamina         Nutrition & Weight - Outcomes:  Pre Biometrics - 10/31/23 1208       Pre Biometrics   Height 5' 2 (1.575 m)    Weight 72.7 kg    Waist Circumference 37 inches    Hip Circumference 40 inches    Waist to Hip Ratio 0.93 %    BMI (Calculated) 29.31    Grip Strength 16.3 kg          Post Biometrics - 01/04/24 1500        Post  Biometrics   Height 5' 2 (1.575 m)    Weight 72 kg    Waist Circumference  37 inches    Hip Circumference 40 inches    Waist to Hip Ratio 0.93 %    BMI (Calculated) 29.02    Grip Strength 15.1 kg          Nutrition:   Nutrition Discharge:   Education Questionnaire Score:   Goals reviewed with patient; copy given to patient.

## 2024-01-11 NOTE — Progress Notes (Signed)
 Cardiac Individual Treatment Plan  Patient Details  Name: [name] MRN: [id number] Date of Birth: 03-01-1941 Referring Provider:   Flowsheet Row CARDIAC REHAB PHASE II ORIENTATION from 10/31/2023 in [facility] CARDIAC REHABILITATION  Referring Provider Elmira Penman MD  Savilla Riggs MD]    Initial Encounter Date:  Flowsheet Row CARDIAC REHAB PHASE II ORIENTATION from 10/31/2023 in [facility] CARDIAC REHABILITATION  Date 10/31/23    Visit Diagnosis: NSTEMI (non-ST elevated myocardial infarction) [facility])  Patient's Home Medications on Admission:  Current Outpatient Medications:    albuterol  (PROVENTIL  HFA;VENTOLIN  HFA) 108 (90 BASE) MCG/ACT inhaler, Inhale 2 puffs into the lungs every 4 (four) hours as needed for shortness of breath., Disp: 1 Inhaler, Rfl: 0   albuterol  (PROVENTIL ) (2.5 MG/3ML) 0.083% nebulizer solution, Take 2.5 mg by nebulization every 6 (six) hours as needed for wheezing or shortness of breath., Disp: , Rfl:    Alirocumab  (PRALUENT ) 75 MG/ML SOAJ, Inject 1 mL (75 mg total) into the skin every 14 (fourteen) days. (Patient not taking: Reported on 10/28/2023), Disp: 6 mL, Rfl: 3   amitriptyline  (ELAVIL ) 25 MG tablet, Take 25 mg by mouth at bedtime., Disp: , Rfl:    anastrozole  (ARIMIDEX ) 1 MG tablet, Take 1 tablet (1 mg total) by mouth at bedtime., Disp: 90 tablet, Rfl: 3   aspirin  EC 81 MG tablet, Take 1 tablet (81 mg total) by mouth daily. Swallow whole., Disp: 90 tablet, Rfl: 0   carvedilol  (COREG ) 12.5 MG tablet, Take 1 tablet (12.5 mg total) by mouth 2 (two) times daily., Disp: 180 tablet, Rfl: 1   dorzolamide -timolol  (COSOPT ) 22.3-6.8 MG/ML ophthalmic solution, INSTILL 1 DROP INTO RIGHT EYE TWICE A DAY (Patient taking differently: Place 1 drop into the right eye 2 (two) times daily.), Disp: 30 mL, Rfl: 2   FARXIGA  5 MG TABS tablet, Take 5 mg by mouth every morning., Disp: , Rfl:    gabapentin  (NEURONTIN ) 300 MG capsule, Take 300 mg by mouth 3 (three) times  daily. 9am, 1pm & 9pm, Disp: , Rfl:    hydrALAZINE  (APRESOLINE ) 50 MG tablet, Take 1 tablet (50 mg total) by mouth 3 (three) times daily., Disp: 270 tablet, Rfl: 3   isosorbide  mononitrate (IMDUR ) 120 MG 24 hr tablet, Take 1 tablet (120 mg total) by mouth daily., Disp: 90 tablet, Rfl: 1   Lancets (ONETOUCH DELICA PLUS LANCET30G) MISC, at bedtime., Disp: , Rfl:    latanoprost  (XALATAN ) 0.005 % ophthalmic solution, Place 1 drop into the left eye in the morning and at bedtime., Disp: , Rfl:    linagliptin  (TRADJENTA ) 5 MG TABS tablet, Take 5 mg by mouth at bedtime., Disp: , Rfl:    meclizine  (ANTIVERT ) 25 MG tablet, Take 25 mg by mouth every 6 (six) hours as needed for dizziness., Disp: , Rfl:    Multiple Vitamins-Minerals (PRESERVISION AREDS 2 PO), Take 1 capsule by mouth in the morning and at bedtime., Disp: , Rfl:    NON FORMULARY, Take 1 each by mouth at bedtime. CBD Gummy, Disp: , Rfl:    olmesartan (BENICAR) 40 MG tablet, Take 40 mg by mouth daily., Disp: , Rfl:    Omega-3 Fatty Acids (FISH OIL) 1000 MG CAPS, Take 1,000 mg by mouth 2 (two) times daily. 9am & 9pm, Disp: , Rfl:    ONETOUCH ULTRA test strip, 1 each by Other route at bedtime., Disp: , Rfl:    pantoprazole  (PROTONIX ) 40 MG tablet, Take 1 tablet (40 mg total) by mouth daily., Disp: 90 tablet,  Rfl: 0   warfarin (COUMADIN ) 2 MG tablet, TAKE 1 TABLET BY MOUTH EVERY DAY AT BEDTIME OR AS DIRECTED BY THE COUMADIN  CLINIC, Disp: 90 tablet, Rfl: 1  Past Medical History: Past Medical History:  Diagnosis Date   Antral gastritis    EGD 11/15   Arthritis    Asthmatic bronchitis    Back pain    Breast cancer (HCC)    right breast   CAD in native artery 03/10/2021   Chronic diastolic heart failure (HCC) 05/20/2015   Grade 2 diastolic dysfunction.  04/2015.   Chronic kidney disease    kidney function low   Chronic stable angina (HCC) 12/16/2023   COPD (chronic obstructive pulmonary disease) (HCC)    Diabetes mellitus    x 5 yrs   DVT  of axillary vein, acute left (HCC) 07/24/2012   GERD (gastroesophageal reflux disease)    Glaucoma    POAG OU   Heart murmur    rheum fever at age 35   History of hiatal hernia    History of kidney stones    Hyperlipidemia 05/20/2015   Hypertension    Hypertensive retinopathy    OU   Hypothyroidism    Kidney stones    Macular degeneration    Wet OD, Dry OS   Mixed hyperlipidemia    OSA (obstructive sleep apnea) 12/17/2021   does not use cpap on regular basis   Peripheral venous insufficiency    Pinched nerve    right elbow   Pneumonia    PONV (postoperative nausea and vomiting)    Sigmoid diverticulitis    Snoring 03/10/2021   Vertigo    chonic    Tobacco Use: Social History   Tobacco Use  Smoking Status Former   Types: Cigarettes   Passive exposure: Never  Smokeless Tobacco Never  Tobacco Comments   Smoke 1-1 1/2 packs a day    Labs: Review Flowsheet  More data exists      Latest Ref Rng & Units 11/25/2020 08/20/2021 03/11/2022 10/10/2023 10/11/2023  Labs for ITP Cardiac and Pulmonary Rehab  Cholestrol 0 - 200 mg/dL - 867  860  - 821   LDL (calc) 0 - 99 mg/dL - 60  63  - 893   HDL-C >40 mg/dL - 49  44  - 37   Trlycerides <150 mg/dL - 865  838  - 822   Hemoglobin A1c 4.8 - 5.6 % 7.5  - - 7.0  -    Capillary Blood Glucose: Lab Results  Component Value Date   GLUCAP 75 01/09/2024   GLUCAP 126 (H) 01/02/2024   GLUCAP 182 (H) 12/21/2023   GLUCAP 85 12/19/2023   GLUCAP 79 12/19/2023     Exercise Target Goals: Exercise Program Goal: Individual exercise prescription set using results from initial 6 min walk test and THRR while considering  patient's activity barriers and safety.   Exercise Prescription Goal: Starting with aerobic activity 30 plus minutes a day, 3 days per week for initial exercise prescription. Provide home exercise prescription and guidelines that participant acknowledges understanding prior to discharge.  Activity Barriers & Risk  Stratification:  Activity Barriers & Cardiac Risk Stratification - 10/28/23 1257       Activity Barriers & Cardiac Risk Stratification   Activity Barriers Assistive Device;History of Falls;Back Problems;Shortness of Breath;Balance Concerns;Chest Pain/Angina    Cardiac Risk Stratification Moderate          6 Minute Walk:  6 Minute Walk     Row  Name 10/31/23 1202 01/04/24 1459       6 Minute Walk   Phase Initial Discharge    Distance 720 feet 950 feet    Distance Feet Change -- 230 ft    Walk Time 6 minutes 6 minutes    # of Rest Breaks 0 0    MPH 1.36 1.8    METS 1.1 1.25    RPE 12 12    Perceived Dyspnea  0 0    VO2 Peak 3.9 4.4    Symptoms No No    Resting HR 60 bpm 69 bpm    Resting BP 150/64 118/68    Resting Oxygen Saturation  95 % 96 %    Exercise Oxygen Saturation  during 6 min walk 96 % 96 %    Max Ex. HR 92 bpm 96 bpm    Max Ex. BP 160/60 140/60    2 Minute Post BP 158/64 120/60       Oxygen Initial Assessment:   Oxygen Re-Evaluation:   Oxygen Discharge (Final Oxygen Re-Evaluation):   Initial Exercise Prescription:  Initial Exercise Prescription - 10/31/23 1200       Date of Initial Exercise RX and Referring Provider   Date 10/31/23    Referring Provider Elmira Penman MD   Raford Riggs MD     NuStep   Level 3    SPM 50    Minutes 15    METs 2      REL-XR   Level 2    Speed 60    Minutes 15    METs 2      Prescription Details   Frequency (times per week) 3    Duration Progress to 30 minutes of continuous aerobic without signs/symptoms of physical distress      Intensity   THRR 40-80% of Max Heartrate 91-122    Ratings of Perceived Exertion 11-13    Perceived Dyspnea 0-4      Resistance Training   Training Prescription Yes    Weight 4    Reps 10-15          Perform Capillary Blood Glucose checks as needed.  Exercise Prescription Changes:   Exercise Prescription Changes     Row Name 10/31/23 1200 12/05/23 1500  12/26/23 1500         Response to Exercise   Blood Pressure (Admit) 150/64 112/70 142/62     Blood Pressure (Exercise) 160/60 -- --     Blood Pressure (Exit) 158/64 90/48 110/60     Heart Rate (Admit) 60 bpm 89 bpm 55 bpm     Heart Rate (Exercise) 92 bpm 76 bpm 100 bpm     Heart Rate (Exit) 84 bpm 98 bpm 79 bpm     Oxygen Saturation (Admit) 95 % -- --     Oxygen Saturation (Exercise) 96 % -- --     Oxygen Saturation (Exit) 96 % -- --     Rating of Perceived Exertion (Exercise) 12 13 11      Perceived Dyspnea (Exercise) 0 -- --     Duration -- Continue with 30 min of aerobic exercise without signs/symptoms of physical distress. Continue with 30 min of aerobic exercise without signs/symptoms of physical distress.     Intensity -- THRR unchanged THRR unchanged       Progression   Progression -- Continue to progress workloads to maintain intensity without signs/symptoms of physical distress. Continue to progress workloads to maintain intensity without signs/symptoms of physical distress.  Resistance Training   Training Prescription -- Yes Yes     Weight -- 3 3     Reps -- 10-15 10-15       NuStep   Level -- 2 2     SPM -- 93 88     Minutes -- 15 15     METs -- 2.5 2.3       REL-XR   Level -- 1 1     Speed -- 58 57     Minutes -- 15 15     METs -- 4.2 3.8        Exercise Comments:   Exercise Comments     Row Name 11/08/23 1037           Exercise Comments First full day of exercise!  Patient was oriented to gym and equipment including functions, settings, policies, and procedures.  Patient's individual exercise prescription and treatment plan were reviewed.  All starting workloads were established based on the results of the 6 minute walk test done at initial orientation visit.  The plan for exercise progression was also introduced and progression will be customized based on patient's performance and goals.          Exercise Goals and Review:   Exercise Goals      Row Name 11/08/23 1037             Exercise Goals   Increase Physical Activity Yes       Intervention Develop an individualized exercise prescription for aerobic and resistive training based on initial evaluation findings, risk stratification, comorbidities and participant's personal goals.;Provide advice, education, support and counseling about physical activity/exercise needs.       Expected Outcomes Long Term: Add in home exercise to make exercise part of routine and to increase amount of physical activity.;Short Term: Attend rehab on a regular basis to increase amount of physical activity.;Long Term: Exercising regularly at least 3-5 days a week.       Increase Strength and Stamina Yes       Intervention Develop an individualized exercise prescription for aerobic and resistive training based on initial evaluation findings, risk stratification, comorbidities and participant's personal goals.;Provide advice, education, support and counseling about physical activity/exercise needs.       Expected Outcomes Short Term: Increase workloads from initial exercise prescription for resistance, speed, and METs.;Short Term: Perform resistance training exercises routinely during rehab and add in resistance training at home;Long Term: Improve cardiorespiratory fitness, muscular endurance and strength as measured by increased METs and functional capacity ( )       Able to understand and use rate of perceived exertion (RPE) scale Yes       Intervention Provide education and explanation on how to use RPE scale       Expected Outcomes Short Term: Able to use RPE daily in rehab to express subjective intensity level;Long Term:  Able to use RPE to guide intensity level when exercising independently       Able to understand and use Dyspnea scale Yes       Intervention Provide education and explanation on how to use Dyspnea scale       Expected Outcomes Short Term: Able to use Dyspnea scale daily in rehab to  express subjective sense of shortness of breath during exertion;Long Term: Able to use Dyspnea scale to guide intensity level when exercising independently       Knowledge and understanding of Target Heart Rate Range (THRR) Yes  Intervention Provide education and explanation of THRR including how the numbers were predicted and where they are located for reference       Expected Outcomes Short Term: Able to state/look up THRR;Long Term: Able to use THRR to govern intensity when exercising independently;Short Term: Able to use daily as guideline for intensity in rehab       Able to check pulse independently Yes       Intervention Provide education and demonstration on how to check pulse in carotid and radial arteries.;Review the importance of being able to check your own pulse for safety during independent exercise       Expected Outcomes Short Term: Able to explain why pulse checking is important during independent exercise;Long Term: Able to check pulse independently and accurately       Understanding of Exercise Prescription Yes       Intervention Provide education, explanation, and written materials on patient's individual exercise prescription       Expected Outcomes Short Term: Able to explain program exercise prescription;Long Term: Able to explain home exercise prescription to exercise independently          Exercise Goals Re-Evaluation :  Exercise Goals Re-Evaluation     Row Name 11/08/23 1037 12/14/23 1502           Exercise Goal Re-Evaluation   Exercise Goals Review Able to understand and use Dyspnea scale;Knowledge and understanding of Target Heart Rate Range (THRR);Able to understand and use rate of perceived exertion (RPE) scale;Understanding of Exercise Prescription Increase Physical Activity;Increase Strength and Stamina;Understanding of Exercise Prescription      Comments Reviewed RPE and dyspnea scale, THR and program prescription with pt today.  Pt voiced understanding  and was given a copy of goals to take home. Gimena is off to a good start in rehab.  She has already started to notice a difference in how she is feeling.  She has noted that overall her stamina has started to pick up.  She admits to still having some days where it is a lot harder to get up and go than others.  We talked about how this is normal and will continue to improve as her stamina continues to improve as well.      Expected Outcomes Short: Use RPE daily to regulate intensity.  Long: Follow program prescription in THR. Short; Review home exercise guidelines Long; Conitnue to improve stamina          Discharge Exercise Prescription (Final Exercise Prescription Changes):  Exercise Prescription Changes - 12/26/23 1500       Response to Exercise   Blood Pressure (Admit) 142/62    Blood Pressure (Exit) 110/60    Heart Rate (Admit) 55 bpm    Heart Rate (Exercise) 100 bpm    Heart Rate (Exit) 79 bpm    Rating of Perceived Exertion (Exercise) 11    Duration Continue with 30 min of aerobic exercise without signs/symptoms of physical distress.    Intensity THRR unchanged      Progression   Progression Continue to progress workloads to maintain intensity without signs/symptoms of physical distress.      Resistance Training   Training Prescription Yes    Weight 3    Reps 10-15      NuStep   Level 2    SPM 88    Minutes 15    METs 2.3      REL-XR   Level 1    Speed 57    Minutes  15    METs 3.8          Nutrition:  Target Goals: Understanding of nutrition guidelines, daily intake of sodium 1500mg , cholesterol 200mg , calories 30% from fat and 7% or less from saturated fats, daily to have 5 or more servings of fruits and vegetables.  Biometrics:  Pre Biometrics - 10/31/23 1208       Pre Biometrics   Height 5' 2 (1.575 m)    Weight 72.7 kg    Waist Circumference 37 inches    Hip Circumference 40 inches    Waist to Hip Ratio 0.93 %    BMI (Calculated) 29.31    Grip  Strength 16.3 kg          Post Biometrics - 01/04/24 1500        Post  Biometrics   Height 5' 2 (1.575 m)    Weight 72 kg    Waist Circumference 37 inches    Hip Circumference 40 inches    Waist to Hip Ratio 0.93 %    BMI (Calculated) 29.02    Grip Strength 15.1 kg          Nutrition Therapy Plan and Nutrition Goals:   Nutrition Assessments:  MEDIFICTS Score Key: >=70 Need to make dietary changes  40-70 Heart Healthy Diet <= 40 Therapeutic Level Cholesterol Diet  Flowsheet Row CARDIAC REHAB PHASE II ORIENTATION from 10/31/2023 in [facility] CARDIAC REHABILITATION  Picture Your Plate Total Score on Admission 34   Picture Your Plate Scores: <59 Unhealthy dietary pattern with much room for improvement. 41-50 Dietary pattern unlikely to meet recommendations for good health and room for improvement. 51-60 More healthful dietary pattern, with some room for improvement.  >60 Healthy dietary pattern, although there may be some specific behaviors that could be improved.    Nutrition Goals Re-Evaluation:  Nutrition Goals Re-Evaluation     Row Name 12/14/23 1514             Goals   Nutrition Goal Heart Healthy Eating       Comment Jonni has not been following much of a diet.  She is not eating like she should in general as there are days she does not want to go through the effort to make a meal for one.  We talked about making a normal amount and freezing the rest for other meals.  She does really like fruit and will try to get more and a variety.  She knows she needs to eat and is just going through motions.  We talked about trying meal replacements for when she doesn't really want to cook to make sure that she is maintaining intake.       Expected Outcome Short: Try meal replacements Long: Conitue to eat to [facility]          Nutrition Goals Discharge (Final Nutrition Goals Re-Evaluation):  Nutrition Goals Re-Evaluation - 12/14/23 1514       Goals   Nutrition Goal  Heart Healthy Eating    Comment Negar has not been following much of a diet.  She is not eating like she should in general as there are days she does not want to go through the effort to make a meal for one.  We talked about making a normal amount and freezing the rest for other meals.  She does really like fruit and will try to get more and a variety.  She knows she needs to eat and is just going through motions.  We talked about trying meal replacements for when she doesn't really want to cook to make sure that she is maintaining intake.    Expected Outcome Short: Try meal replacements Long: Conitue to eat to maitain          Psychosocial: Target Goals: Acknowledge presence or absence of significant depression and/or stress, maximize coping skills, provide positive support system. Participant is able to verbalize types and ability to use techniques and skills needed for reducing stress and depression.  Initial Review & Psychosocial Screening:  Initial Psych Review & Screening - 10/28/23 1349       Initial Review   Current issues with Current Psychotropic Meds      Family Dynamics   Good Support System? Yes      Barriers   Psychosocial barriers to participate in program The patient should benefit from training in stress management and relaxation.;There are no identifiable barriers or psychosocial needs.      Screening Interventions   Interventions Encouraged to exercise;To provide support and resources with identified psychosocial needs;Provide feedback about the scores to participant    Expected Outcomes Short Term goal: Utilizing psychosocial counselor, staff and physician to assist with identification of specific Stressors or current issues interfering with healing process. Setting desired goal for each stressor or current issue identified.;Long Term Goal: Stressors or current issues are controlled or eliminated.;Short Term goal: Identification and review with participant of any Quality  of Life or Depression concerns found by scoring the questionnaire.;Long Term goal: The participant improves quality of Life and PHQ9 Scores as seen by post scores and/or verbalization of changes          Quality of Life Scores:  Quality of Life - 10/31/23 1211       Quality of Life   Select Quality of Life      Quality of Life Scores   Health/Function Pre 22.37 %    Socioeconomic Pre 30 %    Psych/Spiritual Pre 30 %    Family Pre 18 %    GLOBAL Pre 24.92 %         Scores of 19 and below usually indicate a poorer quality of life in these areas.  A difference of  2-3 points is a clinically meaningful difference.  A difference of 2-3 points in the total score of the Quality of Life Index has been associated with significant improvement in overall quality of life, self-image, physical symptoms, and general health in studies assessing change in quality of life.  PHQ-9: Review Flowsheet       12/14/2023 10/31/2023 09/17/2021  Depression screen PHQ 2/9  Decreased Interest 0 1 1  Down, Depressed, Hopeless 1 0 1  PHQ - 2 Score 1 1 2   Altered sleeping 0 1 0  Tired, decreased energy 1 2 1   Change in appetite 2 0 0  Feeling bad or failure about yourself  1 1 0  Trouble concentrating 0 1 0  Moving slowly or fidgety/restless 0 0 0  Suicidal thoughts 0 0 0  PHQ-9 Score 5 6 3   Difficult doing work/chores Somewhat difficult - -   Interpretation of Total Score  Total Score Depression Severity:  1-4 = Minimal depression, 5-9 = Mild depression, 10-14 = Moderate depression, 15-19 = Moderately severe depression, 20-27 = Severe depression   Psychosocial Evaluation and Intervention:  Psychosocial Evaluation - 10/28/23 1350       Psychosocial Evaluation & Interventions   Interventions Stress management education;Relaxation education;Encouraged to exercise with the  program and follow exercise prescription    Comments Patient was referred to CR with NSTEMI. She did not have an intervention.  She has had a stent several years ago. She continues to have some chest pain which she reported to Reche Finder, NP at her follow up appointment. The pain usually resolves with time but can happen at rest or activity. She is on Imdur . She thinks it is related to her history of breast cancer. She lives a lone. She says her husband died 4 years ago and she is still adjusting to being alone but she is very active with her church and participates in a lot of activities with them. She says her son and granddaughter are her main family support. She has a new great grand daughter and she loves to spend time with her. She denies and depression, anxiety, or stressors but she was tearful today when ask about her chest pain. She says she does not want to be a burden to her children and does not like to admit she is having chest pain. Amtriptyline is on her medication list but she was not aware of what she is taking but does have neuropathy. She plans to bring a list for her orientation visit. Her goals for the program are to get stronger and to be able to get back to doing all the activities she was doing prior to her NSTEMI. She has no barriers identified to complete the program.    Expected Outcomes Short Term: Patient will start the program and attend consistently. Long Term: Patient will complete the program meeting personal goals.    Continue Psychosocial Services  Follow up required by staff          Psychosocial Re-Evaluation:  Psychosocial Re-Evaluation     Row Name 12/14/23 1504             Psychosocial Re-Evaluation   Current issues with Current Psychotropic Meds;Current Stress Concerns       Comments Teasha is doing well in rehab. She admits to having some down days here and there but tries not to let them get to her.  She feels balanced most of the time, but living by herself is a big barrier.  She enjoys coming to class and her PHQ did improve by one point so far. With living alone, eating is  hard for her and she does not always feel up to the effort.  She is active in church and the ladies meet at least twice a month for a meal out together which Farrell always looks forward to meeting.  She generally sleeps well.  On days she comes to rehab she feels like she is accoplishing something versus just sitting at home.  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       Expected Outcomes Short: Get out and about more to engage with others Long; Conitnue to exercise for mental boost       Interventions Encouraged to attend Cardiac Rehabilitation for the exercise       Continue Psychosocial Services  Follow up required by staff          Psychosocial Discharge (Final Psychosocial Re-Evaluation):  Psychosocial Re-Evaluation - 12/14/23 1504       Psychosocial Re-Evaluation   Current issues with Current Psychotropic Meds;Current Stress Concerns    Comments Marleen is doing well in rehab. She admits to having some down days here and there but tries not to  let them get to her.  She feels balanced most of the time, but living by herself is a big barrier.  She enjoys coming to class and her PHQ did improve by one point so far. With living alone, eating is hard for her and she does not always feel up to the effort.  She is active in church and the ladies meet at least twice a month for a meal out together which Amere always looks forward to meeting.  She generally sleeps well.  On days she comes to rehab she feels like she is accoplishing something versus just sitting at home.  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    Expected Outcomes Short: Get out and about more to engage with others Long; Conitnue to exercise for mental boost    Interventions Encouraged to attend Cardiac Rehabilitation for the exercise    Continue Psychosocial Services  Follow up required by staff          Vocational Rehabilitation: Provide vocational rehab assistance to qualifying  candidates.   Vocational Rehab Evaluation & Intervention:  Vocational Rehab - 10/28/23 1348       Initial Vocational Rehab Evaluation & Intervention   Assessment shows need for Vocational Rehabilitation No      Vocational Rehab Re-Evaulation   Comments Patient is retired.          Education: Education Goals: Education classes will be provided on a weekly basis, covering required topics. Participant will state understanding/return demonstration of topics presented.  Learning Barriers/Preferences:  Learning Barriers/Preferences - 10/28/23 1349       Learning Barriers/Preferences   Learning Barriers None    Learning Preferences Skilled Demonstration;Audio          Education Topics: Hypertension, Hypertension Reduction -Define heart disease and high blood pressure. Discus how high blood pressure affects the body and ways to reduce high blood pressure. Flowsheet Row CARDIAC REHAB PHASE II EXERCISE from 01/11/2024 in [place] [facility]  Date 11/23/23  Educator [facility]  Instruction Review Code 1- Verbalizes Understanding    Exercise and Your Heart -Discuss why it is important to exercise, the FITT principles of exercise, normal and abnormal responses to exercise, and how to exercise safely. Flowsheet Row CARDIAC REHAB PHASE II EXERCISE from 01/11/2024 in [employer] [place] [facility]  Date 12/21/23  Educator hb  Instruction Review Code 1- Verbalizes Understanding    Angina -Discuss definition of angina, causes of angina, treatment of angina, and how to decrease risk of having angina. Flowsheet Row CARDIAC REHAB PHASE II EXERCISE from 01/11/2024 in [place] [facility]  Date 12/14/23  [tests/procedures]  Educator HB  Instruction Review Code 1- Verbalizes Understanding    Cardiac Medications -Review what the following cardiac medications are used for, how they affect the body, and side effects that may occur when taking the medications.   Medications include Aspirin , Beta blockers, calcium channel blockers, ACE Inhibitors, angiotensin receptor blockers, diuretics, digoxin, and antihyperlipidemics. Flowsheet Row CARDIAC REHAB PHASE II EXERCISE from 01/11/2024 in [place]  Date 11/30/23  Educator BS  Instruction Review Code 1- Verbalizes Understanding    Congestive Heart Failure -Discuss the definition of CHF, how to live with CHF, the signs and symptoms of CHF, and how keep track of weight and sodium intake.   Heart Disease and Intimacy -Discus the effect sexual activity has on the heart, how changes occur during intimacy as we age, and safety during sexual  activity. Flowsheet Row CARDIAC REHAB PHASE II EXERCISE from 01/11/2024 in [place]  Date 01/04/24  Educator Hb  Instruction Review Code 1- Verbalizes Understanding    Smoking Cessation / COPD -Discuss different methods to quit smoking, the health benefits of quitting smoking, and the definition of COPD.   Nutrition I: Fats -Discuss the types of cholesterol, what cholesterol does to the heart, and how cholesterol levels can be controlled. Flowsheet Row CARDIAC REHAB PHASE II EXERCISE from 01/11/2024 in [place] [facility]  Date 11/09/23  Educator HB  Instruction Review Code 1- Verbalizes Understanding    Nutrition II: Labels -Discuss the different components of food labels and how to read food label Flowsheet Row CARDIAC REHAB PHASE II EXERCISE from 01/11/2024 in [place] [facility]  Date 01/11/24  Educator DJ  Instruction Review Code 1- Verbalizes Understanding    Heart Parts/Heart Disease and PAD -Discuss the anatomy of the heart, the pathway of blood circulation through the heart, and these are affected by heart disease.   Stress I: Signs and Symptoms -Discuss the causes of stress, how stress may lead to anxiety and depression, and ways to limit stress. Flowsheet Row CARDIAC REHAB  PHASE II EXERCISE from 01/11/2024 in [place] [facility]  Date 11/16/23  Educator HB  Instruction Review Code 1- Verbalizes Understanding    Stress II: Relaxation -Discuss different types of relaxation techniques to limit stress.   Warning Signs of Stroke / TIA -Discuss definition of a stroke, what the signs and symptoms are of a stroke, and how to identify when someone is having stroke.   Knowledge Questionnaire Score:   Core Components/Risk Factors/Patient Goals at Admission:  Personal Goals and Risk Factors at Admission - 10/28/23 1348       Core Components/Risk Factors/Patient Goals on Admission    Weight Management Weight Maintenance    Improve shortness of breath with ADL's Yes    Intervention Provide education, individualized exercise plan and daily activity instruction to help decrease symptoms of SOB with activities of daily living.    Expected Outcomes Short Term: Improve cardiorespiratory fitness to achieve a reduction of symptoms when performing ADLs;Long Term: Be able to perform more ADLs without symptoms or delay the onset of symptoms    Diabetes Yes    Intervention Provide education about signs/symptoms and action to take for hypo/hyperglycemia.;Provide education about proper nutrition, including hydration, and aerobic/resistive exercise prescription along with prescribed medications to achieve blood glucose in normal ranges: Fasting glucose 65-99 mg/dL    Expected Outcomes Short Term: Participant verbalizes understanding of the signs/symptoms and immediate care of hyper/hypoglycemia, proper foot care and importance of medication, aerobic/resistive exercise and nutrition plan for blood glucose control.;Long Term: Attainment of HbA1C < 7%.    Heart Failure Yes    Intervention Provide a combined exercise and nutrition program that is supplemented with education, support and counseling about heart failure. Directed toward relieving symptoms such as shortness of  breath, decreased exercise tolerance, and extremity edema.    Expected Outcomes Improve functional capacity of life;Short term: Attendance in program 2-3 days a week with increased exercise capacity. Reported lower sodium intake. Reported increased fruit and vegetable intake. Reports medication compliance.;Short term: Daily weights obtained and reported for increase. Utilizing diuretic protocols set by physician.;Long term: Adoption of self-care skills and reduction of barriers for early signs and symptoms recognition and intervention leading to self-care maintenance.    Hypertension Yes    Intervention Provide education on lifestyle modifcations including  regular physical activity/exercise, weight management, moderate sodium restriction and increased consumption of fresh fruit, vegetables, and low fat dairy, alcohol  moderation, and smoking cessation.;Monitor prescription use compliance.    Expected Outcomes Long Term: Maintenance of blood pressure at goal levels.;Short Term: Continued assessment and intervention until BP is < 140/5mm HG in hypertensive participants. < 130/33mm HG in hypertensive participants with diabetes, heart failure or chronic kidney disease.    Lipids Yes    Intervention Provide education and support for participant on nutrition & aerobic/resistive exercise along with prescribed medications to achieve LDL 70mg , HDL >40mg .    Expected Outcomes Short Term: Participant states understanding of desired cholesterol values and is compliant with medications prescribed. Participant is following exercise prescription and nutrition guidelines.;Long Term: Cholesterol controlled with medications as prescribed, with individualized exercise RX and with personalized nutrition plan. Value goals: LDL < 70mg , HDL > 40 mg.          Core Components/Risk Factors/Patient Goals Review:   Goals and Risk Factor Review     Row Name 12/14/23 1520             Core Components/Risk Factors/Patient  Goals Review   Personal Goals Review Weight Management/Obesity;Hypertension;Diabetes;Heart Failure       Review Arianis is doing well in rehab.  Her weight is steady.  Sugars and pressures are good and she checks them on occassion at home.  She has not noted any heart failure symptoms recently.       Expected Outcomes Short: Continue to work on weight Long: COnitnue to montior risk factors          Core Components/Risk Factors/Patient Goals at Discharge (Final Review):   Goals and Risk Factor Review - 12/14/23 1520       Core Components/Risk Factors/Patient Goals Review   Personal Goals Review Weight Management/Obesity;Hypertension;Diabetes;Heart Failure    Review Kalayna is doing well in rehab.  Her weight is steady.  Sugars and pressures are good and she checks them on occassion at home.  She has not noted any heart failure symptoms recently.    Expected Outcomes Short: Continue to work on [employer] Long: COnitnue to montior risk factors          ITP Comments:  ITP Comments     Row Name 10/28/23 1404 11/08/23 1037 11/23/23 1012 12/21/23 1010 01/11/24 1448   ITP Comments Virtual orientation visit completed for cardiac rehab with NSTEMI. On-site orientation visit scheduled for 10/31/23. Documentation 10:30 First full day of exercise!  Patient was oriented to gym and equipment including functions, settings, policies, and procedures.  Patient's individual exercise prescription and treatment plan were reviewed.  All starting workloads were established based on the results of the 6 minute walk test done at initial orientation visit.  The plan for exercise progression was also introduced and progression will be customized based on patient's performance and goals. 30 day review completed. ITP sent to Dr. Dorn Ross, Medical Director of Cardiac Rehab. Continue with ITP unless changes are made by physician.  New to program. 30 day review completed. ITP sent to Dr. Dorn Ross, Medical Director of  Cardiac Rehab. Continue with ITP unless changes are made by physician. Shannen graduated today from  rehab with 36 sessions completed.  Details of the patient's exercise prescription and what She needs to do in order to continue the prescription and progress were discussed with patient.  Patient was given a copy of prescription and goals.  Patient verbalized understanding. Jadda plans to continue to exercise  by walking at home.      Comments: Discharge ITP

## 2024-01-11 NOTE — Progress Notes (Signed)
 Daily Session Note  Patient Details  Name: [name] MRN: [id number] Date of Birth: 11-09-1940 Referring Provider:   Flowsheet Row CARDIAC REHAB PHASE II ORIENTATION from 10/31/2023 in [facility]  Referring Provider Elmira Penman MD  Savilla Riggs MD]    Encounter Date: 01/11/2024  Check In:  Session Check In - 01/11/24 1427       Check-In   Supervising physician immediately available to respond to emergencies See telemetry face sheet for immediately available MD    Location [facility]    Staff Present Rolland Sake BSN, RN;Debra Vicci, RN, BSN    Virtual Visit No    Medication changes reported     No    Fall or balance concerns reported    No    Tobacco Cessation No Change    Warm-up and Cool-down Performed on first and last piece of equipment    Resistance Training Performed Yes    VAD Patient? No    PAD/SET Patient? No      Pain Assessment   Currently in Pain? No/denies    Pain Score 0-No pain    Multiple Pain Sites No          Capillary Blood Glucose: Results for orders placed or performed in visit on 01/11/24 (from the past 24 hours)  POCT INR     Status: Abnormal   Collection Time: 01/11/24 10:31 AM  Result Value Ref Range   INR 1.5 (A) 2.0 - 3.0   POC INR        Social History   Tobacco Use  Smoking Status Former   Types: Cigarettes   Passive exposure: Never  Smokeless Tobacco Never  Tobacco Comments   Smoke 1-1 1/2 packs a day    Goals Met:  Independence with exercise equipment Exercise tolerated well No report of concerns or symptoms today Strength training completed today  Goals Unmet:  Not Applicable  Comments: .[name] graduated today from  rehab with 36 sessions completed.  Details of the patient's exercise prescription and what She needs to do in order to continue the prescription and progress were discussed with patient.  Patient was given a copy of prescription and goals.   Patient verbalized understanding. [name] plans to continue to exercise by walking at home.

## 2024-01-11 NOTE — Patient Instructions (Signed)
 Take warfarin 3 tablets tonight then resume 2 tablets daily except 1 tablet on Mondays and Thursdays Recheck INR in 2 wk

## 2024-01-12 ENCOUNTER — Ambulatory Visit (INDEPENDENT_AMBULATORY_CARE_PROVIDER_SITE_OTHER): Admitting: Ophthalmology

## 2024-01-12 ENCOUNTER — Encounter (INDEPENDENT_AMBULATORY_CARE_PROVIDER_SITE_OTHER): Payer: Self-pay | Admitting: Ophthalmology

## 2024-01-12 DIAGNOSIS — H401130 Primary open-angle glaucoma, bilateral, stage unspecified: Secondary | ICD-10-CM

## 2024-01-12 DIAGNOSIS — Z7984 Long term (current) use of oral hypoglycemic drugs: Secondary | ICD-10-CM

## 2024-01-12 DIAGNOSIS — H353122 Nonexudative age-related macular degeneration, left eye, intermediate dry stage: Secondary | ICD-10-CM | POA: Diagnosis not present

## 2024-01-12 DIAGNOSIS — H35033 Hypertensive retinopathy, bilateral: Secondary | ICD-10-CM

## 2024-01-12 DIAGNOSIS — H353211 Exudative age-related macular degeneration, right eye, with active choroidal neovascularization: Secondary | ICD-10-CM | POA: Diagnosis not present

## 2024-01-12 DIAGNOSIS — E119 Type 2 diabetes mellitus without complications: Secondary | ICD-10-CM | POA: Diagnosis not present

## 2024-01-12 DIAGNOSIS — Z961 Presence of intraocular lens: Secondary | ICD-10-CM

## 2024-01-12 DIAGNOSIS — I10 Essential (primary) hypertension: Secondary | ICD-10-CM

## 2024-01-12 MED ORDER — BEVACIZUMAB CHEMO INJECTION 1.25MG/0.05ML SYRINGE FOR KALEIDOSCOPE
1.2500 mg | INTRAVITREAL | Status: AC | PRN
  Administered 2024-01-12: 1.25 mg via INTRAVITREAL

## 2024-01-13 ENCOUNTER — Encounter (HOSPITAL_COMMUNITY)

## 2024-01-16 ENCOUNTER — Encounter (HOSPITAL_COMMUNITY)

## 2024-01-18 ENCOUNTER — Encounter (HOSPITAL_COMMUNITY)

## 2024-01-20 ENCOUNTER — Encounter (HOSPITAL_COMMUNITY)

## 2024-01-23 ENCOUNTER — Encounter (HOSPITAL_COMMUNITY)

## 2024-01-23 ENCOUNTER — Ambulatory Visit: Attending: Cardiology | Admitting: *Deleted

## 2024-01-23 ENCOUNTER — Other Ambulatory Visit (HOSPITAL_COMMUNITY)
Admission: RE | Admit: 2024-01-23 | Discharge: 2024-01-23 | Disposition: A | Discharge status: Home / Self Care | Source: Ambulatory Visit | Attending: Cardiology | Admitting: Cardiology

## 2024-01-23 DIAGNOSIS — Z5181 Encounter for therapeutic drug level monitoring: Secondary | ICD-10-CM | POA: Insufficient documentation

## 2024-01-23 DIAGNOSIS — I82622 Acute embolism and thrombosis of deep veins of left upper extremity: Secondary | ICD-10-CM | POA: Insufficient documentation

## 2024-01-23 DIAGNOSIS — I82A12 Acute embolism and thrombosis of left axillary vein: Secondary | ICD-10-CM | POA: Diagnosis not present

## 2024-01-23 LAB — PROTIME-INR
INR: 5.5 (ref 0.8–1.2)
Prothrombin Time: 52 s — ABNORMAL HIGH (ref 11.4–15.2)

## 2024-01-23 LAB — POCT INR: INR: 8 — AB (ref 2.0–3.0)

## 2024-01-23 NOTE — Progress Notes (Signed)
Please see anticoagulation encounter.

## 2024-01-23 NOTE — Patient Instructions (Signed)
 POC INR >8.0    Sent to [facility] lab for STAT PT/INR   INR 5.5 at Lab Pt has been on Augmentin .  Has 3 days left. Hold warfarin x 3 days then resume 2 tablets daily except 1 tablet on Mondays and Thursdays Recheck INR in 1 wk Pt denies S/S of excessive bruising or bleeding.  Bleeding and fall precautions discussed with pt and she verbalized understanding.

## 2024-01-24 ENCOUNTER — Ambulatory Visit: Payer: Self-pay | Admitting: Cardiology

## 2024-01-25 ENCOUNTER — Encounter (HOSPITAL_COMMUNITY)

## 2024-02-01 ENCOUNTER — Ambulatory Visit: Attending: Cardiology | Admitting: *Deleted

## 2024-02-01 DIAGNOSIS — I82A12 Acute embolism and thrombosis of left axillary vein: Secondary | ICD-10-CM | POA: Diagnosis not present

## 2024-02-01 DIAGNOSIS — Z5181 Encounter for therapeutic drug level monitoring: Secondary | ICD-10-CM

## 2024-02-01 LAB — POCT INR: INR: 3.7 — AB (ref 2.0–3.0)

## 2024-02-01 NOTE — Patient Instructions (Signed)
 Hold warfarin tonight then decrease dose to 2 tablets daily except 1 tablet on Mondays, Thursdays and Saturdays. Recheck INR in 1 wk

## 2024-02-01 NOTE — Progress Notes (Signed)
 INR 3.7; Please see anticoagulation encounter

## 2024-02-08 ENCOUNTER — Ambulatory Visit: Attending: Cardiology | Admitting: *Deleted

## 2024-02-08 DIAGNOSIS — I82A12 Acute embolism and thrombosis of left axillary vein: Secondary | ICD-10-CM | POA: Diagnosis not present

## 2024-02-08 DIAGNOSIS — Z5181 Encounter for therapeutic drug level monitoring: Secondary | ICD-10-CM

## 2024-02-08 LAB — POCT INR: INR: 2.5 (ref 2.0–3.0)

## 2024-02-08 NOTE — Progress Notes (Signed)
 INR 2.5. Please see anticoagulation encounter

## 2024-02-08 NOTE — Patient Instructions (Signed)
 Continue warfarin 2 tablets daily except 1 tablet on Mondays, Thursdays and Saturdays. Recheck INR in 3 wk

## 2024-02-14 ENCOUNTER — Telehealth (HOSPITAL_BASED_OUTPATIENT_CLINIC_OR_DEPARTMENT_OTHER): Payer: Self-pay

## 2024-02-14 ENCOUNTER — Ambulatory Visit (HOSPITAL_BASED_OUTPATIENT_CLINIC_OR_DEPARTMENT_OTHER): Admitting: Cardiovascular Disease

## 2024-02-14 NOTE — Progress Notes (Deleted)
 Cardiology Office Note:  .   Date:  02/14/2024  ID:  [name], DOB 1940/08/11, MRN [id number] PCP: Marvine Rush, MD  [facility] Providers Cardiologist:  None { Click to update primary MD,subspecialty MD or APP then REFRESH:1}   History of Present Illness: .   [name] is a 83 y.o. female with CAD (50% LAD), HFpEF,  hypertension, hyperlipidemia, prior DVT on warfarin, breast cancer s/p lumpectomy and XRT, and diabetes type 2 who presents for follow up.  She was first seen 04/2015 at which time she reported occasional chest pain.  She had an exercise Myoview  04/2015 that showed LVEF 78% with a small defect of moderate severity in the mid anterior and apical anterior region.  This was felt to be due to breast attenuation artifact.  However, she underwent cardiac catheterization on 05/26/15 that revealed a 50% LAD lesion. There was concern that there may be a component of vasospasm so long-acting nitrates were started. Her blood pressure and hyperlipidemia medications have been titrated due to poor control.  She is also on Lasix  due to lower extremity edema.   [name]' husband died of COVID double pneumonia on 07/2019.  He was treated with Remdesivir at [facility] outpatient infusion center.  She was experiencing atypical chest pain.  She was referred for Lexiscan  Myoview  that revealed LVEF 74% with no ischemia.  Since then she called our office with concern for dizziness.  She spoke with our pharmacist and was advised to stop her hydralazine .  However her dizziness did not improve and it was not thought to be due to the medication or her blood pressure.  She had an episode while sitting on the toilet had to grab the garbage can to vomit.  They recommended that she see her PCP.  She had vertigo in the past and has used meclizine .  Her LFTs were elevated so her PCP stopped her pravastatin .  She since started Praluent .    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  She underwent  lumpectomy and XRT. She had a sleep study 03/2021 which showed mild sleep apnea. Given underling medical conditions, it was recommended she start an oral airway device or a CPAP.  She was admitted 07/2021 with the flu and sinusitis.  [name] was overloaded, advised to take Lasix  and wear extra compression socks. She followed up on 8/23 and a CTA was recommended which confirmed severe stenosis in the left common carotid and ICA. She underwent carotid endarterectomy 03/24/22.  At her visit 06/2022 she was doing well from a cardiac standpoint.   [name] was admitted 10/2023 with NSTEMI.  Left heart cath revealed 70% OM lesion and otherwise nonobstructive disease.  Medical management was recommended.  She saw Reche Finder, [phone] and was doing well.  She did note occasional angina and carvedilol  was increased.  At her appointment 12/2023 she reported some occasional light chest pain with exertion.  It was less severe than prior to her NSTEMI.  She had not been on Praluent  for many months.  We renewed her prescription.  Blood pressure was uncontrolled and hydralazine  was increased.  Discussed the use of AI scribe software for clinical note transcription with the patient, who gave verbal consent to proceed.  History of Present Illness     ROS:  As per HPI  Studies Reviewed: .        *** Risk Assessment/Calculations:   {Does this patient have ATRIAL FIBRILLATION?:[id number]} No BP recorded.  {  Refresh Note OR Click here to enter BP  :1}***       Physical Exam:   VS:  There were no vitals taken for this visit. , BMI There is no height or weight on file to calculate BMI. GENERAL:  Well appearing HEENT: Pupils equal round and reactive, fundi not visualized, oral mucosa unremarkable NECK:  No jugular venous distention, waveform within normal limits, carotid upstroke brisk and symmetric, no bruits, no thyromegaly LUNGS:  Clear to auscultation bilaterally HEART:  RRR.  PMI not displaced or  sustained,S1 and S2 within normal limits, no S3, no S4, no clicks, no rubs, no murmurs ABD:  Flat, positive bowel sounds normal in frequency in pitch, no bruits, no rebound, no guarding, no midline pulsatile mass, no hepatomegaly, no splenomegaly EXT:  2 plus pulses throughout, no edema, no cyanosis no clubbing SKIN:  No rashes no nodules NEURO:  Cranial nerves II through XII grossly intact, motor grossly intact throughout PSYCH:  Cognitively intact, oriented to person place and time   ASSESSMENT AND PLAN: .    Assessment & Plan         {Are you ordering a CV Procedure (e.g. stress test, cath, DCCV, TEE, etc)?   Press F2        :[id number]}  Dispo: ***  Signed, Annabella Scarce, MD

## 2024-02-14 NOTE — Telephone Encounter (Signed)
 Left message for patient to call back. Wanting to check on patient due to pt no showing appointment.

## 2024-02-16 ENCOUNTER — Emergency Department (HOSPITAL_COMMUNITY)
Admission: EM | Admit: 2024-02-16 | Discharge: 2024-02-16 | Disposition: A | Discharge status: Home / Self Care | Source: Ambulatory Visit | Attending: Emergency Medicine | Admitting: Emergency Medicine

## 2024-02-16 ENCOUNTER — Emergency Department (HOSPITAL_COMMUNITY)

## 2024-02-16 ENCOUNTER — Encounter (HOSPITAL_COMMUNITY): Payer: Self-pay | Admitting: Pharmacy Technician

## 2024-02-16 ENCOUNTER — Other Ambulatory Visit: Payer: Self-pay

## 2024-02-16 DIAGNOSIS — Z7901 Long term (current) use of anticoagulants: Secondary | ICD-10-CM | POA: Diagnosis not present

## 2024-02-16 DIAGNOSIS — R0789 Other chest pain: Secondary | ICD-10-CM | POA: Insufficient documentation

## 2024-02-16 DIAGNOSIS — R531 Weakness: Secondary | ICD-10-CM | POA: Diagnosis not present

## 2024-02-16 DIAGNOSIS — R079 Chest pain, unspecified: Secondary | ICD-10-CM

## 2024-02-16 DIAGNOSIS — I2510 Atherosclerotic heart disease of native coronary artery without angina pectoris: Secondary | ICD-10-CM | POA: Diagnosis not present

## 2024-02-16 LAB — BASIC METABOLIC PANEL WITH GFR
Anion gap: 9 (ref 5–15)
BUN: 16 mg/dL (ref 8–23)
CO2: 25 mmol/L (ref 22–32)
Calcium: 9.4 mg/dL (ref 8.9–10.3)
Chloride: 108 mmol/L (ref 98–111)
Creatinine, Ser: 0.93 mg/dL (ref 0.44–1.00)
GFR, Estimated: >60 mL/min (ref >60)
Glucose, Bld: 98 mg/dL (ref 70–99)
Potassium: 3.8 mmol/L (ref 3.5–5.1)
Sodium: 142 mmol/L (ref 135–145)

## 2024-02-16 LAB — CBC
HCT: 42.2 % (ref 36.0–46.0)
Hemoglobin: 13.7 g/dL (ref 12.0–15.0)
MCH: 30.3 pg (ref 26.0–34.0)
MCHC: 32.5 g/dL (ref 30.0–36.0)
MCV: 93.4 fL (ref 80.0–100.0)
Platelets: 229 K/uL (ref 150–400)
RBC: 4.52 MIL/uL (ref 3.87–5.11)
RDW: 13.3 % (ref 11.5–15.5)
WBC: 5.8 K/uL (ref 4.0–10.5)
nRBC: 0 % (ref 0.0–0.2)

## 2024-02-16 LAB — TROPONIN I (HIGH SENSITIVITY)
Troponin I (High Sensitivity): 5 ng/L (ref <18)
Troponin I (High Sensitivity): 6 ng/L (ref <18)

## 2024-02-16 LAB — PROTIME-INR
INR: 3.3 — ABNORMAL HIGH (ref 0.8–1.2)
Prothrombin Time: 35 s — ABNORMAL HIGH (ref 11.4–15.2)

## 2024-02-16 NOTE — ED Triage Notes (Signed)
 Pt here with weakness for at least a week. Went to PCP and was told to follow up here for a heart scan. Denies chest pain currently, endorses intermittent chest pain.

## 2024-02-16 NOTE — Discharge Instructions (Signed)
 Your testing is normal, there is no signs of heart attack, I do want you to return for worsening symptoms otherwise you can see your doctor in 3 days for recheck.  Make sure you are taking all of your medications exactly as prescribed

## 2024-02-16 NOTE — ED Provider Notes (Signed)
 [name] AT [facility] Provider Note   CSN: [id number] Arrival date & time: 02/16/24  1433     Patient presents with: Weakness   [name] is a 83 y.o. female.    Weakness  This patient is an 83 year old female, she has a prior history of coronary disease infection states she had a heart attack about 4 months ago, review of the medical record shows that the patient had a non-ST elevation myocardial infarction, she has been treated by Dr. Raford and Dr. Alvan with the cardiology service.  She had been admitted to the hospital and had a heart catheterization on October 11, 2023, evidently on the heart catheterization it said that there was no culprit lesion identified to explain the ischemic symptoms, the patient was currently on warfarin and decision was made not to add dual antiplatelet therapy.  There was a culprit lesion in the diagonal ostial branch at 70% stenosis but had TIMI-3 flow and it said unlikely to be acute culprit the rest of the cath had less than 40% disease.  Echocardiogram performed in April after the heart cath showed an ejection fraction of 60 to 65% with indeterminate left ventricular diastolic parameters  The patient states that for the last couple of days she has had some intermittent chest discomfort, she has had some this morning, seems to be exertional, she went to her doctor's office because she was feeling generally poorly and was redirected here.  She denies swelling of the legs fevers chills, she does occasionally get nausea with the chest discomfort    Prior to Admission medications   Medication Sig Start Date End Date Taking? Authorizing Provider  albuterol  (PROVENTIL  HFA;VENTOLIN  HFA) 108 (90 BASE) MCG/ACT inhaler Inhale 2 puffs into the lungs every 4 (four) hours as needed for shortness of breath. 06/04/15   Raford Riggs, MD  albuterol  (PROVENTIL ) (2.5 MG/3ML) 0.083% nebulizer solution Take 2.5 mg by  nebulization every 6 (six) hours as needed for wheezing or shortness of breath.    [provider]  Alirocumab  (PRALUENT ) 75 MG/ML SOAJ Inject 1 mL (75 mg total) into the skin every 14 (fourteen) days. Patient not taking: Reported on 01/12/2024 10/27/23   Vannie Reche RAMAN, NP  amitriptyline  (ELAVIL ) 25 MG tablet Take 25 mg by mouth at bedtime.    [provider]  anastrozole  (ARIMIDEX ) 1 MG tablet Take 1 tablet (1 mg total) by mouth at bedtime. 04/13/23   Gudena, Vinay, MD  carvedilol  (COREG ) 12.5 MG tablet Take 1 tablet (12.5 mg total) by mouth 2 (two) times daily. 10/24/23   Walker, Caitlin S, NP  dorzolamide -timolol  (COSOPT ) 22.3-6.8 MG/ML ophthalmic solution INSTILL 1 DROP INTO RIGHT EYE TWICE A DAY 08/28/20   Valdemar Rogue, MD  FARXIGA  5 MG TABS tablet Take 5 mg by mouth every morning. 10/01/20   [provider]  gabapentin  (NEURONTIN ) 300 MG capsule Take 300 mg by mouth 3 (three) times daily. 9am, 1pm & 9pm 05/08/19   [provider]  hydrALAZINE  (APRESOLINE ) 50 MG tablet Take 1 tablet (50 mg total) by mouth 3 (three) times daily. 12/16/23   Raford Riggs, MD  isosorbide  mononitrate (IMDUR ) 120 MG 24 hr tablet Take 1 tablet (120 mg total) by mouth daily. 10/24/23   Vannie Reche RAMAN, NP  isosorbide  mononitrate (IMDUR ) 30 MG 24 hr tablet TAKE 1 TABLET BY MOUTH ONCE DAILY 01/13/24   Raford Riggs, MD  Lancets [facility] DELICA PLUS LANCET30G) MISC at bedtime. 09/14/23  [provider]  latanoprost  (XALATAN ) 0.005 % ophthalmic solution Place 1 drop into the left eye in the morning and at bedtime.    [provider]  linagliptin  (TRADJENTA ) 5 MG TABS tablet Take 5 mg by mouth at bedtime.    [provider]  meclizine  (ANTIVERT ) 25 MG tablet Take 25 mg by mouth every 6 (six) hours as needed for dizziness.    [provider]  Multiple Vitamins-Minerals (PRESERVISION AREDS 2 PO) Take 1 capsule by mouth in the morning and at bedtime.     [provider]  NON FORMULARY Take 1 each by mouth at bedtime. CBD Gummy    [provider]  olmesartan (BENICAR) 40 MG tablet Take 40 mg by mouth daily. 09/15/23   [provider]  Omega-3 Fatty Acids (FISH OIL) 1000 MG CAPS Take 1,000 mg by mouth 2 (two) times daily. 9am & 9pm    [provider]  ONETOUCH ULTRA test strip 1 each by Other route at bedtime. 09/14/23   [provider]  pantoprazole  (PROTONIX ) 40 MG tablet Take 1 tablet (40 mg total) by mouth daily. 10/13/23 01/11/24  Arlice Reichert, MD  warfarin (COUMADIN ) 2 MG tablet TAKE 1 TABLET BY MOUTH EVERY DAY AT BEDTIME OR AS DIRECTED BY THE COUMADIN  CLINIC *DISREGARD 30 DAY SUPPLY THIS IS 90 DAY SUPPLY* 01/13/24   Raford Riggs, MD    Allergies: Alphagan  [brimonidine ], Diflunisal, Vioxx [rofecoxib], Metformin and related, Nexlizet  [bempedoic acid-ezetimibe], Repatha  [evolocumab ], Codeine, Elemental sulfur, Motrin [ibuprofen], Penicillins, and Pravastatin     Review of Systems  Neurological:  Positive for weakness.  All other systems reviewed and are negative.   Updated Vital Signs BP (!) 135/104 (BP Location: Right Arm)   Pulse 74   Temp 97.6 F (36.4 C) (Oral)   Resp 17   SpO2 96%   Physical Exam Vitals and nursing note reviewed.  Constitutional:      General: She is not in acute distress.    Appearance: She is well-developed.  HENT:     Head: Normocephalic and atraumatic.     Mouth/Throat:     Pharynx: No oropharyngeal exudate.  Eyes:     General: No scleral icterus.       Right eye: No discharge.        Left eye: No discharge.     Conjunctiva/sclera: Conjunctivae normal.     Pupils: Pupils are equal, round, and reactive to light.  Neck:     Thyroid : No thyromegaly.     Vascular: No JVD.  Cardiovascular:     Rate and Rhythm: Normal rate and regular rhythm.     Heart sounds: Normal heart sounds. No murmur heard.    No friction rub. No gallop.  Pulmonary:     Effort:  Pulmonary effort is normal. No respiratory distress.     Breath sounds: Normal breath sounds. No wheezing or rales.  Abdominal:     General: Bowel sounds are normal. There is no distension.     Palpations: Abdomen is soft. There is no mass.     Tenderness: There is no abdominal tenderness.  Musculoskeletal:        General: No tenderness. Normal range of motion.     Cervical back: Normal range of motion and neck supple.     Right lower leg: No edema.     Left lower leg: No edema.  Lymphadenopathy:     Cervical: No cervical adenopathy.  Skin:    General: Skin is warm  and dry.     Findings: No erythema or rash.  Neurological:     Mental Status: She is alert.     Cranial Nerves: Cranial nerve deficit: .milm.     Coordination: Coordination normal.  Psychiatric:        Behavior: Behavior normal.     (all labs ordered are listed, but only abnormal results are displayed) Labs Reviewed  CBC  BASIC METABOLIC PANEL WITH GFR  TROPONIN I (HIGH SENSITIVITY)    EKG: EKG Interpretation Date/Time:  Thursday February 16 2024 14:57:20 EDT Ventricular Rate:  73 PR Interval:  196 QRS Duration:  134 QT Interval:  432 QTC Calculation: 475 R Axis:   128  Text Interpretation: Unusual P axis, possible ectopic atrial rhythm Right bundle branch block Left posterior fascicular block Bifascicular block Abnormal ECG When compared with ECG of 09-Oct-2023 23:13, PREVIOUS ECG IS PRESENT Confirmed by Cleotilde Rogue ([id number]) on 02/16/2024 4:16:36 PM  Radiology: [facility] Chest 2 View Result Date: 02/16/2024 CLINICAL DATA:  weakness EXAM: CHEST - 2 VIEW COMPARISON:  None available. FINDINGS: No focal airspace consolidation, pleural effusion, or pneumothorax. No cardiomegaly. Tortuous aorta with aortic atherosclerosis. No acute fracture or destructive lesions. Multilevel degenerative disc disease of the spine. Cervical fusion hardware, partially visualized. IMPRESSION: No acute cardiopulmonary abnormality.  Electronically Signed   By: Rogelia Myers M.D.   On: 02/16/2024 15:55     Procedures   Medications Ordered in the ED - No data to display                                  Medical Decision Making Amount and/or Complexity of Data Reviewed Labs: ordered. Radiology: ordered.   I have reviewed the EKG, I do not see any acute findings, she has a bifascicular block, she has symptoms that are concerning with exertional chest discomfort which has been intermittent and seemingly to be escalating, she has known 70% lesion however the rest of her cath seemed okay.  The patient had no symptoms in the emergency department of any chest pain, her workup included a metabolic panel, 2 troponins and a CBC as well as an INR, she is already anticoagulated, she is not having any coughing or shortness of breath and her chest x-ray was unremarkable.  The patient's testing today did not reveal any obvious sources of her symptoms.  She was completely symptom-free and excited to be discharged.   Radiology Imaging: I personally viewed the images of the ordered radiographic studies and find no acute findings I agree with the radiologist interpretation as well        Final diagnoses:  None    ED Discharge Orders     None          Cleotilde Rogue, MD 02/17/24 2001

## 2024-02-21 NOTE — Telephone Encounter (Signed)
 Patient seen in ED 8/14 and to f/u with PCP   Message sent to schedulers to reach out and get follow up scheduled

## 2024-02-22 ENCOUNTER — Ambulatory Visit (INDEPENDENT_AMBULATORY_CARE_PROVIDER_SITE_OTHER): Admitting: Nurse Practitioner

## 2024-02-22 ENCOUNTER — Encounter (HOSPITAL_BASED_OUTPATIENT_CLINIC_OR_DEPARTMENT_OTHER): Payer: Self-pay | Admitting: Nurse Practitioner

## 2024-02-22 ENCOUNTER — Other Ambulatory Visit (HOSPITAL_BASED_OUTPATIENT_CLINIC_OR_DEPARTMENT_OTHER): Payer: Self-pay

## 2024-02-22 VITALS — BP 140/70 | HR 71 | Ht 62.0 in | Wt 154.9 lb

## 2024-02-22 DIAGNOSIS — I10 Essential (primary) hypertension: Secondary | ICD-10-CM

## 2024-02-22 DIAGNOSIS — Z79899 Other long term (current) drug therapy: Secondary | ICD-10-CM

## 2024-02-22 DIAGNOSIS — I700 Atherosclerosis of aorta: Secondary | ICD-10-CM

## 2024-02-22 DIAGNOSIS — R079 Chest pain, unspecified: Secondary | ICD-10-CM

## 2024-02-22 DIAGNOSIS — I2510 Atherosclerotic heart disease of native coronary artery without angina pectoris: Secondary | ICD-10-CM

## 2024-02-22 DIAGNOSIS — I25118 Atherosclerotic heart disease of native coronary artery with other forms of angina pectoris: Secondary | ICD-10-CM | POA: Diagnosis not present

## 2024-02-22 DIAGNOSIS — E785 Hyperlipidemia, unspecified: Secondary | ICD-10-CM

## 2024-02-22 DIAGNOSIS — R42 Dizziness and giddiness: Secondary | ICD-10-CM

## 2024-02-22 MED ORDER — PRALUENT 75 MG/ML ~~LOC~~ SOAJ
75.0000 mg | SUBCUTANEOUS | 3 refills | Status: AC
  Filled 2024-02-22: qty 6, 84d supply, fill #0

## 2024-02-22 NOTE — Patient Instructions (Signed)
 Medication Instructions:   Alirocumab  (PRALUENT ) 75 MG/ML SOAJ--THIS WAS SENT DOWNSTAIRS AT OUR PHARMACY FOR YOU TO PICK UP TODAY BEFORE LEAVING    *If you need a refill on your cardiac medications before your next appointment, please call your pharmacy*   Lab Work:  IN 3 MONTHS HERE AT [facility] ON THE 3RD FLOOR [address]--LIPIDS--PLEASE COME FASTING TO THIS LAB APPOINTMENT  If you have labs (blood work) drawn today and your tests are completely normal, you will receive your results only by: MyChart Message (if you have MyChart) OR A paper copy in the mail If you have any lab test that is abnormal or we need to change your treatment, we will call you to review the results.    Follow-Up: At [facility], you and your health needs are our priority.  As part of our continuing mission to provide you with exceptional heart care, our providers are all part of one team.  This team includes your primary Cardiologist (physician) and Advanced Practice Providers or APPs (Physician Assistants and Nurse Practitioners) who all work together to provide you with the care you need, when you need it.  Your next appointment:   6 month(s)  Provider:   Annabella Scarce, MD     Other Instructions  PLEASE CHECK INTO THE SILVER SNEAKERS PROGRAM AT YOUR LOCAL [employer]    PLEASE TAKE AND RECORD YOUR BLOOD PRESSURE READINGS FOR THE NEXT 2 WEEKS AND SEND THOSE READINGS TO MICHELLE SWINYER NP IN [facility]   Blood Pressure Record Sheet To take your blood pressure, you will need a blood pressure machine. You can buy a blood pressure machine (blood pressure monitor) at your clinic, drug store, or online. When choosing one, consider: An automatic monitor that has an arm cuff. A cuff that wraps snugly around your upper arm. You should be able to fit only one finger between your arm and the cuff. A device that stores blood pressure reading results. Do not choose a monitor that measures your blood pressure  from your wrist or finger. Follow your health care provider's instructions for how to take your blood pressure. To use this form: Take your blood pressure medications every day These measurements should be taken when you have been at rest for at least 10-15 min Take at least 2 readings with each blood pressure check. This makes sure the results are correct. Wait 1-2 minutes between measurements. Write down the results in the spaces on this form. Keep in mind it should always be recorded systolic over diastolic. Both numbers are important.  Repeat this every day for 2-3 weeks, or as told by your health care provider.  Make a follow-up appointment with your health care provider to discuss the results.  Blood Pressure Log Date Medications taken? (Y/N) Blood Pressure Time of Day

## 2024-02-22 NOTE — Progress Notes (Unsigned)
 Cardiology Office Note   Date:  02/23/2024  ID:  [name], DOB [birth year], MRN [id number] PCP: Marvine Rush, MD  [facility] Providers Cardiologist:  None     PMH Coronary artery disease 50% LAD stenosis NSTEMI>>LHC 10/2023 70% OM lesion>>medical management HFpEF Hypertension Hyperlipidemia Prior DVT on warfarin Breast cancer s/p lumpectomy and XRT Type 2 diabetes mellitus Carotid artery disease s/p left endarterectomy  Initially seen in 2016 for chest pain.  Exercise Myoview  04/2015 showed LVEF 78% with small defect of moderate severity in mid anterior and apical anterior region felt to be due to breast attenuation artifact.  Cardiac catheterization 05/26/2015 revealed 50% LAD lesion.  There was a concern there may be a component of vasospasm so long-acting nitrates were started.  BP and hyperlipidemia have been titrated but with poor control.  She was on Lasix  due to lower extremity edema.  Her husband died of COVID double pneumonia 07/2019.  He was treated with remdesivir at [facility] outpatient infusion center.  She was having atypical chest pain and was referred for Lexiscan  Myoview  which revealed LVEF 74%, no ischemia.  She contacted our office for concerns of dizziness.  She spoke with one of the pharmacist and was advised to stop hydralazine , however dizziness did not improve.  She had an episode while sitting on the toilet and had to grab the garbage can to vomit.  She was diagnosed with vertigo and placed on meclizine .  Her LFTs were elevated so PCP stopped her pravastatin .  She subsequently started Praluent .  She was diagnosed with DCIS in the right breast and underwent lumpectomy and XRT.  Sleep study 03/2021 showed mild sleep apnea.  Given underlying medical conditions, she was advised to start oral airway device or CPAP.  She was admitted 07/2021 with flu and sinusitis.  She appeared to be volume overloaded and was advised to take Lasix  and wear compression stockings.   CTA 02/2022 confirmed severe stenosis in left common carotid and ICA.  She underwent carotid endarterectomy 03/24/2022.  Admission 10/2023 with NSTEMI.  Left heart cath revealed 70% OM lesion and otherwise nonobstructive disease.  Medical management recommended.  She saw Reche Finder, NP on 10/24/2023 and was doing well.  She noted occasional angina and carvedilol  was increased.  Last cardiology clinic visit was 12/16/2023 with Dr. Raford.  She noted occasional light chest pain primarily during physical activity.  The pain was less severe than previous episodes and did not limit her activities as she tends to sit down when it occurs.  She was experiencing dizziness particularly when standing and was using a cane for balance.  She had not been receiving her Praluent  medication for a long time.  Current therapy was carvedilol , hydralazine  3 times daily, Imdur , olmesartan 40 mg once daily, and Coumadin .  She has decreased vision due to a stent placed to lower her eye pressure.  She was advised to restart Praluent  and return for follow-up in 2 to 3 months.  History of Present Illness  Discussed the use of AI scribe software for clinical note transcription with the patient, who gave verbal consent to proceed.  History of Present Illness [name] is a pleasant 83 year old female who presents today for management of hypertension. She was recently seen in ED with chest pain with negative troponin x 2 and EKG and CXR with no acute changes. Her chest pain resolved while in the ED and no further chest pain has occurred since. She monitors her blood pressure  at home, with readings around systolic 120 and diastolic numbers usually in the 80s. Her medication regimen includes carvedilol  twice a day, Imdur  once a day, olmesartan once a day, and hydralazine  three times a day. She experiences occasional dizziness, particularly when standing up quickly or moving her head suddenly, but denies significant leg swelling.  Her daily activities are limited due to fatigue. She goes to [employer] 5 days per week with a group of friends and generally orders a butter biscuit with honey. She continues to grieve the loss of her husband and does not enjoy living alone and cooking for herself. Her refrigerator currently needs replacing. Her son and his family live close by. She denies chest pain, shortness of breath, lower extremity edema, palpitations, presyncope, syncope, orthopnea, and PND.   ROS: See HPI  Studies Reviewed       Lipoprotein (a)  Date/Time Value Ref Range Status  10/12/2023 04:13 AM <8.4 <75.0 nmol/L Final    Comment:    (NOTE) **Results verified by repeat testing** Note:  Values greater than or equal to 75.0 nmol/L may       indicate an independent risk factor for CHD,       but must be evaluated with caution when applied       to non-Caucasian populations due to the       influence of genetic factors on Lp(a) across       ethnicities. Performed At: [facility] [address] [place], [place] [id number] Jennette Shorter MD Ey:[id number]     Risk Assessment/Calculations   HYPERTENSION CONTROL Vitals:   02/22/24 1411 02/22/24 1455  BP: (!) 156/68 (!) 140/70    The patient's blood pressure is elevated above target today.  In order to address the patient's elevated BP: Blood pressure will be monitored at home to determine if medication changes need to be made.          Physical Exam VS:  BP (!) 140/70   Pulse 71   Ht 5' 2 (1.575 m)   Wt 154 lb 14.4 oz (70.3 kg)   SpO2 95%   BMI 28.33 kg/m    Wt Readings from Last 3 Encounters:  02/22/24 154 lb 14.4 oz (70.3 kg)  01/04/24 158 lb 11.7 oz (72 kg)  12/16/23 154 lb (69.9 kg)    GEN: Well nourished, well developed in no acute distress NECK: No JVD; No carotid bruits CARDIAC: RRR, no murmurs, rubs, gallops RESPIRATORY:  Clear to auscultation without rales, wheezing or rhonchi  ABDOMEN: Soft, non-tender,  non-distended EXTREMITIES:  No edema; No deformity   Assessment & Plan Hypertension   Home blood pressure readings are variable, with an in-office reading of 156/88 and 140/70 mmHg on my recheck.  She reports home readings mostly in the 120s over 80s, however she did not bring those for us  to review.  She also mentions a diastolic reading of 110 mmHg.  I have asked her to send home readings with the assistance of her family in 2 weeks. Current medications are well-tolerated and she reports compliance.  Renal function stable on labs completed 02/16/2024. -Continue olmesartan, Imdur , hydralazine , carvedilol . -Adjust antihypertensive medication if necessary based on home readings -Discussed dietary modifications to limit sodium, saturated fat, and processed foods  Chest pain CAD Nonobstructive CAD on cath 10/11/2023.  She recently experienced chest pain and went to the ED for evaluation.  Troponins negative x 2.  No acute changes on EKG or CXR.  No further episodes of chest  pain.  She denies dyspnea, chest pain, or other symptoms concerning for worsening angina.  No indication for further ischemia evaluation at this time.  She is not on aspirin  due to chronic Coumadin  therapy for DVT. -Continue GDMT including carvedilol , hydralazine , Imdur , olmesartan -She was encouraged to increase physical activity with chair exercises and to consider Silver Sneakers classes at her local [employer] -Eat a mostly whole food diet limiting saturated fat, processed foods, sugar, and other simple carbohydrates  Hyperlipidemia LDL goal < 70 LP(a) is negative. Lipid panel completed 10/11/2023 with triglycerides 177, HDL 37, LD L106, and total cholesterol 178.  This was an increase in LDL from 63 to year prior. She was prescribed Praluent  but has not taken it in many months. Praluent  was reordered by Dr. Raford in June, however the patient has not receive it from her pharmacy.  She is agreeable to pick up Praluent  from our [facility]  pharmacy prior to leaving the building.   - Start Praluent  75 mg every 14 days  - Recheck lipid panel in 3 months for surveillance   Dizziness   Mild carotid artery disease Intermittent dizziness occurs with rapid positional changes, consistent with orthostatic changes. Carotid duplex completed 10/2022 with mild stenosis (1-39%) bilaterally. No acute changes to indicate further testing is needed at this time.  -Continue meclizine  as needed -Provide a printout of chair exercises to improve stability -Continue careful position changes and use cane for stability        Dispo: 6 months with Dr. Raford  Signed, Rosaline Bane, NP-C

## 2024-02-23 ENCOUNTER — Encounter (HOSPITAL_BASED_OUTPATIENT_CLINIC_OR_DEPARTMENT_OTHER): Payer: Self-pay | Admitting: Nurse Practitioner

## 2024-02-29 ENCOUNTER — Ambulatory Visit: Attending: Cardiology | Admitting: *Deleted

## 2024-02-29 DIAGNOSIS — I82A12 Acute embolism and thrombosis of left axillary vein: Secondary | ICD-10-CM | POA: Diagnosis not present

## 2024-02-29 DIAGNOSIS — Z5181 Encounter for therapeutic drug level monitoring: Secondary | ICD-10-CM

## 2024-02-29 LAB — POCT INR: INR: 6.3 — AB (ref 2.0–3.0)

## 2024-02-29 NOTE — Progress Notes (Signed)
 INR 6.3 Please see anticoagulation encounter

## 2024-02-29 NOTE — Patient Instructions (Signed)
 Hold warfarin x 3 days then decrease dose to 1 tablet daily except 2 tablets on Sundays, Tuesdays and Thursdays. Recheck INR in 10 days Denies S/S of bleeding or excessive bruising.  Bleeding and fall precautions discussed with pt and she verbalized understanding.

## 2024-03-12 NOTE — Telephone Encounter (Signed)
 Lvm for pt to either send in BP reading through [facility] or call office to give BP readings verbally.

## 2024-03-12 NOTE — Telephone Encounter (Signed)
-----   Message from Percy Rosaline HERO sent at 02/23/2024  6:03 AM EDT ----- Please check on home BP readings.  She was in the office on 02/22/2024 and BP was elevated.  We need to see what BP is running at home.  She was asked to monitor.  Thank you!

## 2024-03-13 ENCOUNTER — Other Ambulatory Visit (HOSPITAL_COMMUNITY): Payer: Self-pay | Admitting: Family Medicine

## 2024-03-13 ENCOUNTER — Ambulatory Visit: Attending: Cardiology | Admitting: *Deleted

## 2024-03-13 ENCOUNTER — Ambulatory Visit (HOSPITAL_COMMUNITY)
Admission: RE | Admit: 2024-03-13 | Discharge: 2024-03-13 | Disposition: A | Discharge status: Home / Self Care | Source: Ambulatory Visit | Attending: Family Medicine | Admitting: Family Medicine

## 2024-03-13 DIAGNOSIS — S39012A Strain of muscle, fascia and tendon of lower back, initial encounter: Secondary | ICD-10-CM | POA: Insufficient documentation

## 2024-03-13 DIAGNOSIS — Z5181 Encounter for therapeutic drug level monitoring: Secondary | ICD-10-CM

## 2024-03-13 DIAGNOSIS — I82A12 Acute embolism and thrombosis of left axillary vein: Secondary | ICD-10-CM | POA: Diagnosis not present

## 2024-03-13 LAB — POCT INR: INR: 2.4 (ref 2.0–3.0)

## 2024-03-13 NOTE — Progress Notes (Signed)
 INR 2.4 Please see anticoagulation encounter

## 2024-03-13 NOTE — Patient Instructions (Signed)
 Continue warfarin 1 tablet daily except 2 tablets on Sundays, Tuesdays and Thursdays. Recheck INR in 3 wk

## 2024-03-28 ENCOUNTER — Telehealth (HOSPITAL_BASED_OUTPATIENT_CLINIC_OR_DEPARTMENT_OTHER): Payer: Self-pay | Admitting: *Deleted

## 2024-03-28 NOTE — Telephone Encounter (Signed)
Mail box full . Will try later .

## 2024-03-28 NOTE — Telephone Encounter (Signed)
-----   Message from Percy Rosaline HERO sent at 02/23/2024  6:03 AM EDT ----- Please check on home BP readings.  She was in the office on 02/22/2024 and BP was elevated.  We need to see what BP is running at home.  She was asked to monitor.  Thank you!

## 2024-04-03 ENCOUNTER — Ambulatory Visit: Attending: Cardiology | Admitting: *Deleted

## 2024-04-03 DIAGNOSIS — Z5181 Encounter for therapeutic drug level monitoring: Secondary | ICD-10-CM

## 2024-04-03 DIAGNOSIS — I82A12 Acute embolism and thrombosis of left axillary vein: Secondary | ICD-10-CM | POA: Diagnosis not present

## 2024-04-03 LAB — POCT INR: INR: 6.4 — AB (ref 2.0–3.0)

## 2024-04-03 NOTE — Patient Instructions (Signed)
 Hold warfarin x 3 days then resume 1 tablet daily except 2 tablets on Sundays, Tuesdays and Thursdays.  Recheck INR in 10 days  Denies S/S of bleeding or excessive bruising.  Bleeding and fall precautions discussed with pt and she verbalized understanding.

## 2024-04-03 NOTE — Progress Notes (Signed)
 INR 6.4; Please see anticoagulation encounter

## 2024-04-05 NOTE — Telephone Encounter (Signed)
VM full; will try later

## 2024-04-05 NOTE — Telephone Encounter (Signed)
-----   Message from Percy Rosaline HERO sent at 02/23/2024  6:03 AM EDT ----- Please check on home BP readings.  She was in the office on 02/22/2024 and BP was elevated.  We need to see what BP is running at home.  She was asked to monitor.  Thank you!

## 2024-04-07 ENCOUNTER — Other Ambulatory Visit (HOSPITAL_BASED_OUTPATIENT_CLINIC_OR_DEPARTMENT_OTHER): Payer: Self-pay | Admitting: Family

## 2024-04-07 DIAGNOSIS — I25118 Atherosclerotic heart disease of native coronary artery with other forms of angina pectoris: Secondary | ICD-10-CM

## 2024-04-07 DIAGNOSIS — I10 Essential (primary) hypertension: Secondary | ICD-10-CM

## 2024-04-10 ENCOUNTER — Ambulatory Visit: Attending: Cardiology | Admitting: *Deleted

## 2024-04-10 DIAGNOSIS — I82A12 Acute embolism and thrombosis of left axillary vein: Secondary | ICD-10-CM | POA: Diagnosis not present

## 2024-04-10 DIAGNOSIS — Z5181 Encounter for therapeutic drug level monitoring: Secondary | ICD-10-CM | POA: Diagnosis not present

## 2024-04-10 LAB — POCT INR: INR: 1.1 — AB (ref 2.0–3.0)

## 2024-04-10 NOTE — Progress Notes (Signed)
 INR 1.1 Please see anticoagulation encounter

## 2024-04-10 NOTE — Patient Instructions (Signed)
 Continue warfarin 1 tablet daily except 2 tablets on Sundays, Tuesdays and Thursdays.  Recheck INR in 1 week

## 2024-04-11 NOTE — Telephone Encounter (Signed)
VM full; will try later

## 2024-04-17 ENCOUNTER — Ambulatory Visit: Attending: Cardiology | Admitting: *Deleted

## 2024-04-17 DIAGNOSIS — Z5181 Encounter for therapeutic drug level monitoring: Secondary | ICD-10-CM | POA: Diagnosis not present

## 2024-04-17 DIAGNOSIS — I82A12 Acute embolism and thrombosis of left axillary vein: Secondary | ICD-10-CM | POA: Diagnosis not present

## 2024-04-17 LAB — POCT INR: INR: 1.8 — AB (ref 2.0–3.0)

## 2024-04-17 NOTE — Patient Instructions (Signed)
 Increase warfarin to 2 tablets daily except 1 tablet on Mondays, Wednesday and Fridays.  Recheck INR in 2 weeks

## 2024-04-17 NOTE — Progress Notes (Signed)
 INR 1.8. Please see anticoagulation encounter

## 2024-04-29 ENCOUNTER — Other Ambulatory Visit (HOSPITAL_BASED_OUTPATIENT_CLINIC_OR_DEPARTMENT_OTHER): Payer: Self-pay | Admitting: Family

## 2024-04-29 DIAGNOSIS — I10 Essential (primary) hypertension: Secondary | ICD-10-CM

## 2024-04-29 DIAGNOSIS — I25118 Atherosclerotic heart disease of native coronary artery with other forms of angina pectoris: Secondary | ICD-10-CM

## 2024-05-01 ENCOUNTER — Ambulatory Visit: Attending: Cardiology | Admitting: *Deleted

## 2024-05-01 DIAGNOSIS — I82A12 Acute embolism and thrombosis of left axillary vein: Secondary | ICD-10-CM | POA: Diagnosis not present

## 2024-05-01 DIAGNOSIS — Z5181 Encounter for therapeutic drug level monitoring: Secondary | ICD-10-CM | POA: Diagnosis not present

## 2024-05-01 LAB — POCT INR: INR: 7 — AB (ref 2.0–3.0)

## 2024-05-01 NOTE — Progress Notes (Signed)
 INR 7.0; Please see anticoagulation encounter

## 2024-05-01 NOTE — Patient Instructions (Addendum)
 Hold warfarin 4 days  (Tuesday _ Friday)  then resume 2 tablets daily except 1 tablet on Mondays, Wednesday and Fridays.  Recheck INR in 1 week Pt denies s/s of bleeding or excessive bruising.  Bleeding and fall precautions discussed with pt and she verbalized understanding.

## 2024-05-02 NOTE — Progress Notes (Signed)
 [facility] - Clinic Note  05/03/2024     CHIEF COMPLAINT Patient presents for Retina Follow Up  HISTORY OF PRESENT ILLNESS: [name] is a 83 y.o. female who presents to the clinic today for:  HPI     Retina Follow Up   Patient presents with  Wet AMD.  In right eye.  Severity is moderate.  Duration of 26 weeks.  Since onset it is stable.  I, the attending physician,  performed the HPI with the patient and updated documentation appropriately.        Comments   Pt here for 16 wk f/u for exu ARMD OD. Pt states VA in OD seems a little blurrier.       Last edited by Valdemar Rogue, MD on 05/03/2024  1:44 PM.    Pt states    Referring physician: Marvine Rush, MD [address] [place],  [place] [address]  HISTORICAL INFORMATION:  Selected notes from the MEDICAL RECORD NUMBER Referred by Dr. Oneil Kawasaki for concern of SRF OD LEE: 11.27.20 (M. Cotter) [BCVA: OD: 20/80-- OS: 20/60-]  Ocular Hx-glaucoma (latanoprost )  PMH-DM    CURRENT MEDICATIONS: Current Outpatient Medications (Ophthalmic Drugs)  Medication Sig   dorzolamide -timolol  (COSOPT ) 22.3-6.8 MG/ML ophthalmic solution INSTILL 1 DROP INTO RIGHT EYE TWICE A DAY   latanoprost  (XALATAN ) 0.005 % ophthalmic solution Place 1 drop into the left eye in the morning and at bedtime.   No current facility-administered medications for this visit. (Ophthalmic Drugs)   Current Outpatient Medications (Other)  Medication Sig   albuterol  (PROVENTIL  HFA;VENTOLIN  HFA) 108 (90 BASE) MCG/ACT inhaler Inhale 2 puffs into the lungs every 4 (four) hours as needed for shortness of breath.   albuterol  (PROVENTIL ) (2.5 MG/3ML) 0.083% nebulizer solution Take 2.5 mg by nebulization every 6 (six) hours as needed for wheezing or shortness of breath.   Alirocumab  (PRALUENT ) 75 MG/ML SOAJ Inject 1 mL (75 mg total) into the skin every 14 (fourteen) days.   amitriptyline  (ELAVIL ) 25 MG tablet Take 25 mg by mouth at bedtime.    anastrozole  (ARIMIDEX ) 1 MG tablet Take 1 tablet (1 mg total) by mouth at bedtime.   carvedilol  (COREG ) 12.5 MG tablet TAKE 1 TABLET BY MOUTH 2 TIMES DAILY.   FARXIGA  5 MG TABS tablet Take 5 mg by mouth every morning.   gabapentin  (NEURONTIN ) 300 MG capsule Take 300 mg by mouth 3 (three) times daily. 9am, 1pm & 9pm   hydrALAZINE  (APRESOLINE ) 50 MG tablet Take 1 tablet (50 mg total) by mouth 3 (three) times daily.   isosorbide  mononitrate (IMDUR ) 120 MG 24 hr tablet TAKE 1 TABLET BY MOUTH EVERY DAY   isosorbide  mononitrate (IMDUR ) 30 MG 24 hr tablet TAKE 1 TABLET BY MOUTH ONCE DAILY   Lancets (ONETOUCH DELICA PLUS LANCET30G) MISC at bedtime.   linagliptin  (TRADJENTA ) 5 MG TABS tablet Take 5 mg by mouth at bedtime.   meclizine  (ANTIVERT ) 25 MG tablet Take 25 mg by mouth every 6 (six) hours as needed for dizziness.   Multiple Vitamins-Minerals (PRESERVISION AREDS 2 PO) Take 1 capsule by mouth in the morning and at bedtime.   NON FORMULARY Take 1 each by mouth at bedtime. CBD Gummy   olmesartan (BENICAR) 40 MG tablet Take 40 mg by mouth daily.   Omega-3 Fatty Acids (FISH OIL) 1000 MG CAPS Take 1,000 mg by mouth 2 (two) times daily. 9am & 9pm   ONETOUCH ULTRA test strip 1 each by Other  route at bedtime.   pantoprazole  (PROTONIX ) 40 MG tablet Take 1 tablet (40 mg total) by mouth daily.   warfarin (COUMADIN ) 2 MG tablet TAKE 1 TABLET BY MOUTH EVERY DAY AT BEDTIME OR AS DIRECTED BY THE COUMADIN  CLINIC *DISREGARD 30 DAY SUPPLY THIS IS 90 DAY SUPPLY*   No current facility-administered medications for this visit. (Other)   REVIEW OF SYSTEMS: ROS   Positive for: Genitourinary, Endocrine, Cardiovascular, Eyes Negative for: Constitutional, Gastrointestinal, Neurological, Skin, Musculoskeletal, HENT, Respiratory, Psychiatric, Allergic/Imm, Heme/Lymph Last edited by Antonetta Almetta BRAVO, COT on 05/03/2024  8:39 AM.     ALLERGIES Allergies  Allergen Reactions   Alphagan  [Brimonidine ] Itching    Diflunisal Swelling    Other reaction(s): ENTIRE BODY SWELLING   Vioxx [Rofecoxib] Shortness Of Breath   Metformin And Related     Kidney failure   Nexlizet  [Bempedoic Acid-Ezetimibe]     Causes elevated Liver and Kidney function   Repatha  [Evolocumab ]     MYALGIAS   Codeine Rash   Elemental Sulfur Rash   Motrin [Ibuprofen] Rash   Penicillins Rash   Pravastatin  Rash   PAST MEDICAL HISTORY Past Medical History:  Diagnosis Date   Antral gastritis    EGD 11/15   Arthritis    Asthmatic bronchitis    Back pain    Breast cancer (HCC)    right breast   CAD in native artery 03/10/2021   Chronic diastolic heart failure (HCC) 05/20/2015   Grade 2 diastolic dysfunction.  04/2015.   Chronic kidney disease    kidney function low   Chronic stable angina 12/16/2023   COPD (chronic obstructive pulmonary disease) (HCC)    Diabetes mellitus    x 5 yrs   DVT of axillary vein, acute left (HCC) 07/24/2012   GERD (gastroesophageal reflux disease)    Glaucoma    POAG OU   Heart murmur    rheum fever at age 19   History of hiatal hernia    History of kidney stones    Hyperlipidemia 05/20/2015   Hypertension    Hypertensive retinopathy    OU   Hypothyroidism    Kidney stones    Macular degeneration    Wet OD, Dry OS   Mixed hyperlipidemia    OSA (obstructive sleep apnea) 12/17/2021   does not use cpap on regular basis   Peripheral venous insufficiency    Pinched nerve    right elbow   Pneumonia    PONV (postoperative nausea and vomiting)    Sigmoid diverticulitis    Snoring 03/10/2021   Vertigo    chonic   Past Surgical History:  Procedure Laterality Date   ABDOMINAL HYSTERECTOMY     BACK SURGERY     spinal    BREAST LUMPECTOMY WITH RADIOACTIVE SEED LOCALIZATION Right 12/03/2020   Procedure: RIGHT BREAST LUMPECTOMY WITH RADIOACTIVE SEED LOCALIZATION;  Surgeon: Belinda Cough, MD;  Location: [facility] OR;  Service: General;  Laterality: Right;   CARDIAC CATHETERIZATION N/A  05/26/2015   Procedure: Left Heart Cath and Coronary Angiography;  Surgeon: Candyce GORMAN Reek, MD;  Location: [facility] INVASIVE CV LAB;  Service: Cardiovascular;  Laterality: N/A;   CATARACT EXTRACTION Bilateral    CHOLECYSTECTOMY     COLON SURGERY     COLONOSCOPY N/A 12/27/2013   Procedure: COLONOSCOPY;  Surgeon: Claudis RAYMOND Rivet, MD;  Location: [facility] ENDO SUITE;  Service: Endoscopy;  Laterality: N/A;  200   COLOSTOMY CLOSURE     CYSTOSCOPY/URETEROSCOPY/HOLMIUM LASER/STENT PLACEMENT Right 11/24/2022   Procedure: CYSTOSCOPY  RIGHT URETEROSCOPY/HOLMIUM LASER/STENT PLACEMENT;  Surgeon: Carolee Sherwood JONETTA DOUGLAS, MD;  Location: [facility] ORS;  Service: Urology;  Laterality: Right;  60 MINS FOR CASE   ENDARTERECTOMY Left 03/10/2022   Procedure: LEFT CAROTID ENDARTERECTOMY;  Surgeon: Magda Debby SAILOR, MD;  Location: [facility] OR;  Service: Vascular;  Laterality: Left;   ESOPHAGOGASTRODUODENOSCOPY N/A 05/07/2014   Procedure: ESOPHAGOGASTRODUODENOSCOPY (EGD);  Surgeon: Claudis RAYMOND Rivet, MD;  Location: [facility] SU[facility];  Service: Endoscopy;  Laterality: N/A;   EYE SURGERY Bilateral    Cat Sx   fracture left foot     HERNIA REPAIR     LEFT HEART CATH AND CORONARY ANGIOGRAPHY N/A 10/11/2023   Procedure: LEFT HEART CATH AND CORONARY ANGIOGRAPHY;  Surgeon: Elmira Newman PARAS, MD;  Location: [facility] INVASIVE CV LAB;  Service: Cardiovascular;  Laterality: N/A;   NM MYOCAR PERF WALL MOTION  01/28/2009   Normal   OTHER SURGICAL HISTORY     colostomy, colostomy reversal, for diverticulitis surgical hernia repair, arm surgery, neck surgery   PATCH ANGIOPLASTY Left 03/10/2022   Procedure: PATCH ANGIOPLASTY WITH 1X6CM GEORGE PATCH;  Surgeon: Magda Debby SAILOR, MD;  Location: [facility] OR;  Service: Vascular;  Laterality: Left;   US  ECHOCARDIOGRAPHY  02/11/2010   Mild MR,trace TR & AI   FAMILY HISTORY Family History  Problem Relation Age of Onset   CVA Maternal Grandmother 41       deceased   Heart disease Maternal Grandmother    Stroke Maternal  Grandmother    Breast cancer Maternal Grandmother    Other Mother 12       Cause unknown   Cancer Mother        liver   Heart attack Brother 2       deceased   Breast cancer Sister    Leukemia Maternal Aunt    Glaucoma Maternal Uncle    Bone cancer Maternal Uncle    Spina bifida Daughter    SOCIAL HISTORY Social History   Tobacco Use   Smoking status: Former    Types: Cigarettes    Passive exposure: Never   Smokeless tobacco: Never   Tobacco comments:    Smoke 1-1 1/2 packs a day  Vaping Use   Vaping status: Never Used  Substance Use Topics   Alcohol  use: No   Drug use: No       OPHTHALMIC EXAM: Base Eye Exam     Visual Acuity (Snellen - Linear)       Right Left   Dist [place] 20/80 +2 20/30   Dist ph [place] 20/60 +2          Tonometry (Tonopen, 8:49 AM)       Right Left   Pressure 14 14         Pupils       Pupils Dark Light Shape React APD   Right PERRL 2 1 Round Brisk None   Left PERRL 2 1 Round Brisk None         Visual Fields (Counting fingers)       Left Right    Full Full         Extraocular Movement       Right Left    Full, Ortho Full, Ortho         Neuro/Psych     Oriented x3: Yes   Mood/Affect: Normal         Dilation     Both eyes: 1.0% Mydriacyl, 2.5% Phenylephrine  @ 8:49 AM  Slit Lamp and Fundus Exam     Slit Lamp Exam       Right Left   Lids/Lashes Dermatochalasis - upper lid, mild Meibomian gland dysfunction Dermatochalasis - upper lid, Telangiectasia, mild Meibomian gland dysfunction   Conjunctiva/Sclera White and quiet, superior bleb White and quiet   Cornea 1+Punctate epethelial erosions, well healed cataract wound 1+ fine Punctate epithelial erosions, arcus, mild EBMD, well healed cataract wound   Anterior Chamber narrow temporal angle, tube at 1200 Deep and quiet, narrow temporal angle   Iris round and mod dilated Round and moderately dilated to 5.34mm   Lens Posterior chamber intraocular  lens, open PC Posterior chamber intraocular lens, trace Posterior capsular opacification   Anterior Vitreous Vitreous syneresis, Posterior vitreous detachment Vitreous syneresis         Fundus Exam       Right Left   Disc Sharp rim, +Pallor, +cupping w/ superior and inferior rim thinning, temporal Peripapillary atrophy mild Pallor, Sharp rim, temporal Peripapillary atrophy   C/D Ratio 0.9 0.5   Macula Flat, Blunted foveal reflex, +focal CNV/PED nasal macula with partial pigment ring, trace IRF/ SRF -- stably improved, Drusen, RPE mottling and clumping, no heme Flat, Blunted foveal reflex, fine drusen, mild ERM, Retinal pigment epithelial mottling and clumping, focal CNV w/ shallow SRF/edema superior mac, no heme   Vessels attenuated, Tortuous attenuated, Tortuous   Periphery Attached, mild reticular degeneration, No heme, No RT/RD Attached; no heme           IMAGING AND PROCEDURES  Imaging and Procedures for @TODAY @  OCT, Retina - OU - Both Eyes       Right Eye Quality was good. Central Foveal Thickness: 190. Progression has been stable. Findings include normal foveal contour, retinal drusen , outer retinal tubulation, subretinal hyper-reflective material, intraretinal hyper-reflective material, intraretinal fluid, pigment epithelial detachment, subretinal fluid, outer retinal atrophy (Stable improvement in focal IRF / SRF overlying nasal PED / CNV, partial PVD).   Left Eye Quality was good. Central Foveal Thickness: 200. Progression has worsened. Findings include normal foveal contour, retinal drusen , intraretinal fluid, pigment epithelial detachment, subretinal fluid (Interval development of focal PED w/ surrounding IRF/SRF superior macula, Partial PVD, patchy ORA).   Notes *Images captured and stored on drive  Diagnosis / Impression:  OD: exudative ARMD -- Stable improvement in focal IRF / SRF overlying nasal PED / CNV, partial PVD OS: exudative ARMD -- Interval development  of focal PED w/ surrounding IRF/SRF superior macula, Partial PVD, patchy ORA  Clinical management:  See below  Abbreviations: NFP - Normal foveal profile. CME - cystoid macular edema. PED - pigment epithelial detachment. IRF - intraretinal fluid. SRF - subretinal fluid. EZ - ellipsoid zone. ERM - epiretinal membrane. ORA - outer retinal atrophy. ORT - outer retinal tubulation. SRHM - subretinal hyper-reflective material      Intravitreal Injection, Pharmacologic Agent - OD - Right Eye       Time Out 05/03/2024. 9:21 AM. Confirmed correct patient, procedure, site, and patient consented.   Anesthesia Topical anesthesia was used. Anesthetic medications included Lidocaine  2%, Proparacaine 0.5%.   Procedure Preparation included 5% betadine to ocular surface, eyelid speculum. A supplied (32g) needle was used.   Injection: 1.25 mg Bevacizumab  1.25mg /0.27ml   Route: Intravitreal, Site: Right Eye   NDC: 49757-939-98, Lot: [id number], Expiration date: 05/27/2024   Post-op Post injection exam found visual acuity of at least counting fingers. The patient tolerated the procedure well. There were no complications. The patient received  written and verbal post procedure care education. Post injection medications were not given.      Intravitreal Injection, Pharmacologic Agent - OS - Left Eye       Time Out 05/03/2024. 9:21 AM. Confirmed correct patient, procedure, site, and patient consented.   Anesthesia Topical anesthesia was used. Anesthetic medications included Lidocaine  2%, Proparacaine 0.5%.   Procedure Preparation included 5% betadine to ocular surface, eyelid speculum. A supplied needle was used.   Injection: 1.25 mg Bevacizumab  1.25mg /0.73ml   Route: Intravitreal, Site: Left Eye   NDC: [id number], Lot: [id number], Expiration date: 05/27/2024   Post-op Post injection exam found visual acuity of at least counting fingers. The patient tolerated the procedure well. There were no  complications. The patient received written and verbal post procedure care education. Post injection medications were not given.            ASSESSMENT/PLAN:   ICD-10-CM   1. Exudative age-related macular degeneration of both eyes with active choroidal neovascularization (HCC)  H35.3231 OCT, Retina - OU - Both Eyes    Intravitreal Injection, Pharmacologic Agent - OD - Right Eye    Intravitreal Injection, Pharmacologic Agent - OS - Left Eye    Bevacizumab  (AVASTIN ) SOLN 1.25 mg    Bevacizumab  (AVASTIN ) SOLN 1.25 mg    2. Diabetes mellitus type 2 without retinopathy (HCC)  E11.9     3. Long term (current) use of oral hypoglycemic drugs  Z79.84     4. Essential hypertension  I10     5. Hypertensive retinopathy of both eyes  H35.033     6. Pseudophakia of both eyes  Z96.1     7. Primary open angle glaucoma of both eyes, unspecified glaucoma stage  H40.1130      1. 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, OD  - delayed f/u from 12 wks to 45 on 03.20.24  - h/o delayed f/u from 8 wks to 12 on 6.20.22 due to lumpectomy -- dx'd w/ DCIS - h/o delayed follow up from 4 weeks to 8 weeks due to passing of husband (12.11.20-02.12.21)  - s/p IVA OD #1 (12.11.20), #2 (02.12.21), #3 (03.12.21), #4 (04.09.21), #5 (05.14.21), #6 (06.17.21), #7 (07.23.21), #8 (10.15.21), #9 (12.7.21), #10 (03.22.22), #11 (06.20.22), #12 (08.24.22), #13 (10.31.22), #14 (01.03.23), #15 (08.15.23), #16 (11.15.23), #17 (02.29.24), #18 (03.29.24), #19 (05.09.24), #20 (06.20.24), #21 (08.15.24), #22 (10.10.24), #23 (12.19.24), #24 (03.20.25), #25 (07.10.25)  - FA 12.11.20 confirms +CNVM  **history of increased fluid at 7+ months noted on 08.15.23** - OCT OD shows stable improvement in focal IRF/SRF overlying nasal PED / CNV at 16 weeks  - BCVA OD 20/60 from 20/40  - recommend IVA OD #26 today, 10.30.25 with follow up in 16 weeks  - pt wishes to proceed with injection  - RBA of procedure discussed, questions answered -  IVA informed consent obtained and re-signed 10.30.25 - see procedure note  - Avastin  informed consent form re-signed and scanned on 08.15.2023 (OD) - Continue to use AT OD  1. 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 OS  - Conversion to wet AMD noted on 10.30.25 visit - The incidence pathology and anatomy of wet AMD discussed   - The ANCHOR, MARINA , CATT and VIEW trials discussed with patient.    - discussed treatment options including observation vs intravitreal anti-VEGF agents such as Avastin , Lucentis, Eylea.    - Risks of endophthalmitis and vascular occlusive events and atrophic changes discussed with patient  - OCT OS shows interval development of focal PED w/  surrounding IRF/SRF superior macula, Partial PVD, patchy ORA  - recommend IVA OS #1 today, 10.30.25 due to development of CNV w/ IRF/SRF  - pt wishes to be treated with IVA  - RBA of procedure discussed, questions answered - IVA informed consent obtained and signed 10.30.25 - see procedure note   - f/u in 4wks DFE, OCT, likely inj OU  3,4. Diabetes mellitus, type 2 without retinopathy - The incidence, risk factors for progression, natural history and treatment options for diabetic retinopathy  were discussed with patient.   - The need for close monitoring of blood glucose, blood pressure, and serum lipids, avoiding cigarette or any type of tobacco, and the need for long term follow up was also discussed with patient.  - monitor  5,6. Hypertensive retinopathy OU  - discussed importance of tight BP control  - monitor  7. Pseudophakia OU, PCO OD  - s/p CE/IOL OU (Dr. Cyrus)  - IOL in good position  - s/p yag cap OD (04.06.22) -- good PC opening  - monitor  8. POAG OU  - formerly managed by Dr. Cyrus -- now following at [facility]  - s/p laser w/ Dr. Cyrus -- ?SLT  - s/p trab OD w/ Dr. KYM Gaudy  - IOP 10, 18  - re-started Cosopt  per Dr. Gaudy on 03.17.25  - currently on latanoprost  at bedtime  OU  Ophthalmic Meds Ordered this visit:  Meds ordered this encounter  Medications   Bevacizumab  (AVASTIN ) SOLN 1.25 mg   Bevacizumab  (AVASTIN ) SOLN 1.25 mg     Return in about 4 weeks (around 05/31/2024) for exu ARMD OU, DFE, OCT, Possible Injxn.  There are no Patient Instructions on file for this visit.  This document serves as a record of services personally performed by Redell JUDITHANN Hans, MD, PhD. It was created on their behalf by Almetta Pesa, an ophthalmic technician. The creation of this record is the provider's dictation and/or activities during the visit.    Electronically signed by: Almetta Pesa, OA, 05/03/24  1:45 PM  Redell JUDITHANN Hans, M.D., Ph.D. Diseases & Surgery of the Retina and Vitreous [facility]  I have reviewed the above documentation for accuracy and completeness, and I agree with the above. Redell JUDITHANN Hans, M.D., Ph.D. 05/03/24 1:52 PM   Abbreviations: M myopia (nearsighted); A astigmatism; H hyperopia (farsighted); P presbyopia; Mrx spectacle prescription;  CTL contact lenses; OD right eye; OS left eye; OU both eyes  XT exotropia; ET esotropia; PEK punctate epithelial keratitis; PEE punctate epithelial erosions; DES dry eye syndrome; MGD meibomian gland dysfunction; ATs artificial tears; PFAT's preservative free artificial tears; NSC nuclear sclerotic cataract; PSC posterior subcapsular cataract; ERM epi-retinal membrane; PVD posterior vitreous detachment; RD retinal detachment; DM diabetes mellitus; DR diabetic retinopathy; NPDR non-proliferative diabetic retinopathy; PDR proliferative diabetic retinopathy; CSME clinically significant macular edema; DME diabetic macular edema; dbh dot blot hemorrhages; CWS cotton wool spot; POAG primary open angle glaucoma; C/D cup-to-disc ratio; HVF humphrey visual field; GVF goldmann visual field; OCT optical coherence tomography; IOP intraocular pressure; BRVO Branch retinal vein occlusion; CRVO central  retinal vein occlusion; CRAO central retinal artery occlusion; BRAO branch retinal artery occlusion; RT retinal tear; SB scleral buckle; PPV pars plana vitrectomy; VH Vitreous hemorrhage; PRP panretinal laser photocoagulation; IVK intravitreal kenalog ; VMT vitreomacular traction; MH Macular hole;  NVD neovascularization of the disc; NVE neovascularization elsewhere; AREDS age related eye disease study; ARMD age related macular degeneration; POAG primary open angle glaucoma; EBMD epithelial/anterior  basement membrane dystrophy; ACIOL anterior chamber intraocular lens; IOL intraocular lens; PCIOL posterior chamber intraocular lens; Phaco/IOL phacoemulsification with intraocular lens placement; PRK photorefractive keratectomy; LASIK laser assisted in situ keratomileusis; HTN hypertension; DM diabetes mellitus; COPD chronic obstructive pulmonary disease

## 2024-05-03 ENCOUNTER — Ambulatory Visit (INDEPENDENT_AMBULATORY_CARE_PROVIDER_SITE_OTHER): Admitting: Ophthalmology

## 2024-05-03 ENCOUNTER — Encounter (INDEPENDENT_AMBULATORY_CARE_PROVIDER_SITE_OTHER): Payer: Self-pay | Admitting: Ophthalmology

## 2024-05-03 ENCOUNTER — Encounter (INDEPENDENT_AMBULATORY_CARE_PROVIDER_SITE_OTHER): Admitting: Ophthalmology

## 2024-05-03 DIAGNOSIS — Z961 Presence of intraocular lens: Secondary | ICD-10-CM

## 2024-05-03 DIAGNOSIS — E119 Type 2 diabetes mellitus without complications: Secondary | ICD-10-CM

## 2024-05-03 DIAGNOSIS — H35033 Hypertensive retinopathy, bilateral: Secondary | ICD-10-CM

## 2024-05-03 DIAGNOSIS — Z7984 Long term (current) use of oral hypoglycemic drugs: Secondary | ICD-10-CM | POA: Diagnosis not present

## 2024-05-03 DIAGNOSIS — I10 Essential (primary) hypertension: Secondary | ICD-10-CM

## 2024-05-03 DIAGNOSIS — H353122 Nonexudative age-related macular degeneration, left eye, intermediate dry stage: Secondary | ICD-10-CM

## 2024-05-03 DIAGNOSIS — H401130 Primary open-angle glaucoma, bilateral, stage unspecified: Secondary | ICD-10-CM

## 2024-05-03 DIAGNOSIS — H353231 Exudative age-related macular degeneration, bilateral, with active choroidal neovascularization: Secondary | ICD-10-CM | POA: Diagnosis not present

## 2024-05-03 DIAGNOSIS — H353211 Exudative age-related macular degeneration, right eye, with active choroidal neovascularization: Secondary | ICD-10-CM

## 2024-05-03 MED ORDER — BEVACIZUMAB CHEMO INJECTION 1.25MG/0.05ML SYRINGE FOR KALEIDOSCOPE
1.2500 mg | INTRAVITREAL | Status: AC | PRN
  Administered 2024-05-03: 1.25 mg via INTRAVITREAL

## 2024-05-08 ENCOUNTER — Ambulatory Visit: Attending: Cardiology | Admitting: *Deleted

## 2024-05-08 DIAGNOSIS — Z5181 Encounter for therapeutic drug level monitoring: Secondary | ICD-10-CM | POA: Diagnosis not present

## 2024-05-08 DIAGNOSIS — I82A12 Acute embolism and thrombosis of left axillary vein: Secondary | ICD-10-CM

## 2024-05-08 LAB — POCT INR: INR: 1.7 — AB (ref 2.0–3.0)

## 2024-05-08 NOTE — Progress Notes (Signed)
 INR 1.7; Please see anticoagulation encounter

## 2024-05-08 NOTE — Patient Instructions (Signed)
 Continue 2 tablets daily except 1 tablet on Mondays, Wednesday and Fridays.  Recheck INR in 1 week

## 2024-05-15 ENCOUNTER — Ambulatory Visit: Attending: Cardiology | Admitting: *Deleted

## 2024-05-15 DIAGNOSIS — I82A12 Acute embolism and thrombosis of left axillary vein: Secondary | ICD-10-CM

## 2024-05-15 DIAGNOSIS — Z5181 Encounter for therapeutic drug level monitoring: Secondary | ICD-10-CM | POA: Diagnosis not present

## 2024-05-15 LAB — POCT INR: INR: 6.3 — AB (ref 2.0–3.0)

## 2024-05-15 NOTE — Patient Instructions (Signed)
 Hold warfarin x 3 days then decrease dose to 1 tablet daily except 2 tablets on Tuesdays and Saturdays.  Recheck INR in 1 week Bleeding and fall precautions discussed with pt.  Denies any S/S of bleeding.

## 2024-05-15 NOTE — Progress Notes (Signed)
 INR 6.3 Please see anticoagulation encounter

## 2024-05-22 ENCOUNTER — Ambulatory Visit: Attending: Cardiology | Admitting: *Deleted

## 2024-05-22 DIAGNOSIS — I82A12 Acute embolism and thrombosis of left axillary vein: Secondary | ICD-10-CM | POA: Diagnosis not present

## 2024-05-22 DIAGNOSIS — Z5181 Encounter for therapeutic drug level monitoring: Secondary | ICD-10-CM | POA: Diagnosis not present

## 2024-05-22 LAB — POCT INR: INR: 1.7 — AB (ref 2.0–3.0)

## 2024-05-22 NOTE — Progress Notes (Shared)
 [facility] - Clinic Note  06/05/2024     CHIEF COMPLAINT Patient presents for No chief complaint on file.  HISTORY OF PRESENT ILLNESS: [name] is a 83 y.o. female who presents to the clinic today for:   Pt states    Referring physician: Marvine Rush, MD [address] [place],  [place] [address]  HISTORICAL INFORMATION:  Selected notes from the MEDICAL RECORD NUMBER Referred by Dr. Oneil Kawasaki for concern of SRF OD LEE: 11.27.20 (M. Cotter) [BCVA: OD: 20/80-- OS: 20/60-]  Ocular Hx-glaucoma (latanoprost )  PMH-DM    CURRENT MEDICATIONS: Current Outpatient Medications (Ophthalmic Drugs)  Medication Sig   dorzolamide -timolol  (COSOPT ) 22.3-6.8 MG/ML ophthalmic solution INSTILL 1 DROP INTO RIGHT EYE TWICE A DAY   latanoprost  (XALATAN ) 0.005 % ophthalmic solution Place 1 drop into the left eye in the morning and at bedtime.   No current facility-administered medications for this visit. (Ophthalmic Drugs)   Current Outpatient Medications (Other)  Medication Sig   albuterol  (PROVENTIL  HFA;VENTOLIN  HFA) 108 (90 BASE) MCG/ACT inhaler Inhale 2 puffs into the lungs every 4 (four) hours as needed for shortness of breath.   albuterol  (PROVENTIL ) (2.5 MG/3ML) 0.083% nebulizer solution Take 2.5 mg by nebulization every 6 (six) hours as needed for wheezing or shortness of breath.   Alirocumab  (PRALUENT ) 75 MG/ML SOAJ Inject 1 mL (75 mg total) into the skin every 14 (fourteen) days.   amitriptyline  (ELAVIL ) 25 MG tablet Take 25 mg by mouth at bedtime.   anastrozole  (ARIMIDEX ) 1 MG tablet Take 1 tablet (1 mg total) by mouth at bedtime.   carvedilol  (COREG ) 12.5 MG tablet TAKE 1 TABLET BY MOUTH 2 TIMES DAILY.   FARXIGA  5 MG TABS tablet Take 5 mg by mouth every morning.   gabapentin  (NEURONTIN ) 300 MG capsule Take 300 mg by mouth 3 (three) times daily. 9am, 1pm & 9pm   hydrALAZINE  (APRESOLINE ) 50 MG tablet Take 1 tablet (50 mg total) by mouth 3 (three) times daily.    isosorbide  mononitrate (IMDUR ) 120 MG 24 hr tablet TAKE 1 TABLET BY MOUTH EVERY DAY   isosorbide  mononitrate (IMDUR ) 30 MG 24 hr tablet TAKE 1 TABLET BY MOUTH ONCE DAILY   Lancets (ONETOUCH DELICA PLUS LANCET30G) MISC at bedtime.   linagliptin  (TRADJENTA ) 5 MG TABS tablet Take 5 mg by mouth at bedtime.   meclizine  (ANTIVERT ) 25 MG tablet Take 25 mg by mouth every 6 (six) hours as needed for dizziness.   Multiple Vitamins-Minerals (PRESERVISION AREDS 2 PO) Take 1 capsule by mouth in the morning and at bedtime.   NON FORMULARY Take 1 each by mouth at bedtime. CBD Gummy   olmesartan (BENICAR) 40 MG tablet Take 40 mg by mouth daily.   Omega-3 Fatty Acids (FISH OIL) 1000 MG CAPS Take 1,000 mg by mouth 2 (two) times daily. 9am & 9pm   ONETOUCH ULTRA test strip 1 each by Other route at bedtime.   pantoprazole  (PROTONIX ) 40 MG tablet Take 1 tablet (40 mg total) by mouth daily.   warfarin (COUMADIN ) 2 MG tablet TAKE 1 TABLET BY MOUTH EVERY DAY AT BEDTIME OR AS DIRECTED BY THE COUMADIN  CLINIC *DISREGARD 30 DAY SUPPLY THIS IS 90 DAY SUPPLY*   No current facility-administered medications for this visit. (Other)   REVIEW OF SYSTEMS:   ALLERGIES Allergies  Allergen Reactions   Alphagan  [Brimonidine ] Itching   Diflunisal Swelling    Other reaction(s): ENTIRE BODY SWELLING   Vioxx [Rofecoxib] Shortness Of Breath  Metformin And Related     Kidney failure   Nexlizet  [Bempedoic Acid-Ezetimibe]     Causes elevated Liver and Kidney function   Repatha  [Evolocumab ]     MYALGIAS   Codeine Rash   Elemental Sulfur Rash   Motrin [Ibuprofen] Rash   Penicillins Rash   Pravastatin  Rash   PAST MEDICAL HISTORY Past Medical History:  Diagnosis Date   Antral gastritis    EGD 11/15   Arthritis    Asthmatic bronchitis    Back pain    Breast cancer (HCC)    right breast   CAD in native artery 03/10/2021   Chronic diastolic heart failure (HCC) 05/20/2015   Grade 2 diastolic dysfunction.  04/2015.    Chronic kidney disease    kidney function low   Chronic stable angina 12/16/2023   COPD (chronic obstructive pulmonary disease) (HCC)    Diabetes mellitus    x 5 yrs   DVT of axillary vein, acute left (HCC) 07/24/2012   GERD (gastroesophageal reflux disease)    Glaucoma    POAG OU   Heart murmur    rheum fever at age 54   History of hiatal hernia    History of kidney stones    Hyperlipidemia 05/20/2015   Hypertension    Hypertensive retinopathy    OU   Hypothyroidism    Kidney stones    Macular degeneration    Wet OD, Dry OS   Mixed hyperlipidemia    OSA (obstructive sleep apnea) 12/17/2021   does not use cpap on regular basis   Peripheral venous insufficiency    Pinched nerve    right elbow   Pneumonia    PONV (postoperative nausea and vomiting)    Sigmoid diverticulitis    Snoring 03/10/2021   Vertigo    chonic   Past Surgical History:  Procedure Laterality Date   ABDOMINAL HYSTERECTOMY     BACK SURGERY     spinal    BREAST LUMPECTOMY WITH RADIOACTIVE SEED LOCALIZATION Right 12/03/2020   Procedure: RIGHT BREAST LUMPECTOMY WITH RADIOACTIVE SEED LOCALIZATION;  Surgeon: Belinda Cough, MD;  Location: [facility] OR;  Service: General;  Laterality: Right;   CARDIAC CATHETERIZATION N/A 05/26/2015   Procedure: Left Heart Cath and Coronary Angiography;  Surgeon: Candyce GORMAN Reek, MD;  Location: [facility] INVASIVE CV LAB;  Service: Cardiovascular;  Laterality: N/A;   CATARACT EXTRACTION Bilateral    CHOLECYSTECTOMY     COLON SURGERY     COLONOSCOPY N/A 12/27/2013   Procedure: COLONOSCOPY;  Surgeon: Claudis RAYMOND Rivet, MD;  Location: [facility] ENDO SUITE;  Service: Endoscopy;  Laterality: N/A;  200   COLOSTOMY CLOSURE     CYSTOSCOPY/URETEROSCOPY/HOLMIUM LASER/STENT PLACEMENT Right 11/24/2022   Procedure: CYSTOSCOPY RIGHT URETEROSCOPY/HOLMIUM LASER/STENT PLACEMENT;  Surgeon: Carolee Sherwood JONETTA DOUGLAS, MD;  Location: [facility] ORS;  Service: Urology;  Laterality: Right;  60 MINS FOR CASE   ENDARTERECTOMY Left  03/10/2022   Procedure: LEFT CAROTID ENDARTERECTOMY;  Surgeon: Magda Debby SAILOR, MD;  Location: [facility] OR;  Service: Vascular;  Laterality: Left;   ESOPHAGOGASTRODUODENOSCOPY N/A 05/07/2014   Procedure: ESOPHAGOGASTRODUODENOSCOPY (EGD);  Surgeon: Claudis RAYMOND Rivet, MD;  Location: [facility];  Service: Endoscopy;  Laterality: N/A;   EYE SURGERY Bilateral    Cat Sx   fracture left foot     HERNIA REPAIR     LEFT HEART CATH AND CORONARY ANGIOGRAPHY N/A 10/11/2023   Procedure: LEFT HEART CATH AND CORONARY ANGIOGRAPHY;  Surgeon: Elmira Newman PARAS, MD;  Location: [facility] INVASIVE CV LAB;  Service: Cardiovascular;  Laterality: N/A;   NM MYOCAR PERF WALL MOTION  01/28/2009   Normal   OTHER SURGICAL HISTORY     colostomy, colostomy reversal, for diverticulitis surgical hernia repair, arm surgery, neck surgery   PATCH ANGIOPLASTY Left 03/10/2022   Procedure: PATCH ANGIOPLASTY WITH 1X6CM GEORGE LISLE;  Surgeon: Magda Debby SAILOR, MD;  Location: [facility] OR;  Service: Vascular;  Laterality: Left;   US  ECHOCARDIOGRAPHY  02/11/2010   Mild MR,trace TR & AI   FAMILY HISTORY Family History  Problem Relation Age of Onset   CVA Maternal Grandmother 9       deceased   Heart disease Maternal Grandmother    Stroke Maternal Grandmother    Breast cancer Maternal Grandmother    Other Mother 53       Cause unknown   Cancer Mother        liver   Heart attack Brother 42       deceased   Breast cancer Sister    Leukemia Maternal Aunt    Glaucoma Maternal Uncle    Bone cancer Maternal Uncle    Spina bifida Daughter    SOCIAL HISTORY Social History   Tobacco Use   Smoking status: Former    Types: Cigarettes    Passive exposure: Never   Smokeless tobacco: Never   Tobacco comments:    Smoke 1-1 1/2 packs a day  Vaping Use   Vaping status: Never Used  Substance Use Topics   Alcohol  use: No   Drug use: No       OPHTHALMIC EXAM: Not recorded    IMAGING AND PROCEDURES  Imaging and Procedures for  @TODAY @          ASSESSMENT/PLAN: No diagnosis found.  1. 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/ DCIS - h/o delayed follow up from 4 weeks to 8 weeks due to passing of husband (12.11.20-02.12.21)  - s/p IVA OD #1 (12.11.20), #2 (02.12.21), #3 (03.12.21), #4 (04.09.21), #5 (05.14.21), #6 (06.17.21), #7 (07.23.21), #8 (10.15.21), #9 (12.7.21), #10 (03.22.22), #11 (06.20.22), #12 (08.24.22), #13 (10.31.22), #14 (01.03.23), #15 (08.15.23), #16 (11.15.23), #17 (02.29.24), #18 (03.29.24), #19 (05.09.24), #20 (06.20.24), #21 (08.15.24), #22 (10.10.24), #23 (12.19.24), #24 (03.20.25), #25 (07.10.25), #26 (10.30.25)  - 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 OS  - Conversion to wet AMD noted on 10.30.25 visit - The incidence pathology and anatomy of wet AMD discussed   - The ANCHOR, MARINA , CATT and VIEW trials discussed with patient.    - discussed treatment options including observation vs intravitreal anti-VEGF agents such as Avastin , Lucentis, Eylea.    - Risks of endophthalmitis and vascular occlusive events and atrophic changes discussed with patient  - OCT OS shows interval development of focal PED w/ surrounding  IRF/SRF superior macula, Partial PVD, patchy ORA  - recommend IVA OS #1 today, 10.30.25 due to development of CNV  w/ IRF/SRF  - pt wishes to be treated with IVA  - RBA of procedure discussed, questions answered - IVA informed consent obtained and signed 10.30.25 - see procedure note   - f/u in 4wks DFE, OCT, likely inj OU  3,4. Diabetes mellitus, type 2 without retinopathy - The incidence, risk factors for progression, natural history and treatment options for diabetic retinopathy  were discussed with patient.   - The need for close monitoring of blood glucose, blood pressure, and serum lipids, avoiding cigarette or any type of tobacco, and the need for long term follow up was also discussed with patient.  - monitor  5,6. Hypertensive retinopathy OU  - discussed importance of tight BP control  - monitor  7. Pseudophakia OU, PCO OD  - s/p CE/IOL OU (Dr. Cyrus)  - IOL in good position  - s/p yag cap OD (04.06.22) -- good PC opening  - monitor  8. POAG OU  - formerly managed by Dr. Cyrus -- now following at [facility]  - s/p laser w/ Dr. Cyrus -- ?SLT  - s/p trab OD w/ Dr. KYM Gaudy  - IOP 10, 18  - re-started Cosopt  per Dr. Gaudy on 03.17.25  - currently on latanoprost  at bedtime OU  Ophthalmic Meds Ordered this visit:  No orders of the defined types were placed in this encounter.    No follow-ups on file.  There are no Patient Instructions on file for this visit.  This document serves as a record of services personally performed by Redell JUDITHANN Hans, MD, PhD. It was created on their behalf by Wanda GEANNIE Keens, COT an ophthalmic technician. The creation of this record is the provider's dictation and/or activities during the visit.    Electronically signed by:  Wanda GEANNIE Keens, COT  05/22/24 7:23 AM   Redell JUDITHANN Hans, M.D., Ph.D. Diseases & Surgery of the Retina and Vitreous [facility]    Abbreviations: M myopia (nearsighted); A astigmatism; H hyperopia (farsighted); P presbyopia; Mrx spectacle prescription;  CTL contact lenses; OD right  eye; OS left eye; OU both eyes  XT exotropia; ET esotropia; PEK punctate epithelial keratitis; PEE punctate epithelial erosions; DES dry eye syndrome; MGD meibomian gland dysfunction; ATs artificial tears; PFAT's preservative free artificial tears; NSC nuclear sclerotic cataract; PSC posterior subcapsular cataract; ERM epi-retinal membrane; PVD posterior vitreous detachment; RD retinal detachment; DM diabetes mellitus; DR diabetic retinopathy; NPDR non-proliferative diabetic retinopathy; PDR proliferative diabetic retinopathy; CSME clinically significant macular edema; DME diabetic macular edema; dbh dot blot hemorrhages; CWS cotton wool spot; POAG primary open angle glaucoma; C/D cup-to-disc ratio; HVF humphrey visual field; GVF goldmann visual field; OCT optical coherence tomography; IOP intraocular pressure; BRVO Branch retinal vein occlusion; CRVO central retinal vein occlusion; CRAO central retinal artery occlusion; BRAO branch retinal artery occlusion; RT retinal tear; SB scleral buckle; PPV pars plana vitrectomy; VH Vitreous hemorrhage; PRP panretinal laser photocoagulation; IVK intravitreal kenalog ; VMT vitreomacular traction; MH Macular hole;  NVD neovascularization of the disc; NVE neovascularization elsewhere; AREDS age related eye disease study; ARMD age related macular degeneration; POAG primary open angle glaucoma; EBMD epithelial/anterior basement membrane dystrophy; ACIOL anterior chamber intraocular lens; IOL intraocular lens; PCIOL posterior chamber intraocular lens; Phaco/IOL phacoemulsification with intraocular lens placement; PRK photorefractive keratectomy; LASIK laser assisted in situ keratomileusis; HTN hypertension; DM diabetes mellitus; COPD chronic obstructive pulmonary disease

## 2024-05-22 NOTE — Progress Notes (Signed)
 INR 1.7; Please see anticoagulation encounter

## 2024-05-22 NOTE — Patient Instructions (Signed)
 Continue warfarin 1 tablet daily except 2 tablets on Tuesdays and Saturdays.  Recheck INR in 2 weeks

## 2024-05-31 ENCOUNTER — Other Ambulatory Visit: Payer: Self-pay | Admitting: Hematology and Oncology

## 2024-06-03 ENCOUNTER — Emergency Department (HOSPITAL_COMMUNITY)

## 2024-06-03 ENCOUNTER — Emergency Department (HOSPITAL_COMMUNITY)
Admission: EM | Admit: 2024-06-03 | Discharge: 2024-06-03 | Disposition: A | Discharge status: Home / Self Care | Source: Ambulatory Visit | Attending: Emergency Medicine | Admitting: Emergency Medicine

## 2024-06-03 DIAGNOSIS — N6310 Unspecified lump in the right breast, unspecified quadrant: Secondary | ICD-10-CM | POA: Diagnosis not present

## 2024-06-03 DIAGNOSIS — I10 Essential (primary) hypertension: Secondary | ICD-10-CM | POA: Diagnosis not present

## 2024-06-03 DIAGNOSIS — Z7901 Long term (current) use of anticoagulants: Secondary | ICD-10-CM | POA: Insufficient documentation

## 2024-06-03 DIAGNOSIS — R079 Chest pain, unspecified: Secondary | ICD-10-CM

## 2024-06-03 LAB — CBC
HCT: 49.1 % — ABNORMAL HIGH (ref 36.0–46.0)
Hemoglobin: 15.9 g/dL — ABNORMAL HIGH (ref 12.0–15.0)
MCH: 29.7 pg (ref 26.0–34.0)
MCHC: 32.4 g/dL (ref 30.0–36.0)
MCV: 91.8 fL (ref 80.0–100.0)
Platelets: 216 K/uL (ref 150–400)
RBC: 5.35 MIL/uL — ABNORMAL HIGH (ref 3.87–5.11)
RDW: 12.9 % (ref 11.5–15.5)
WBC: 6.1 K/uL (ref 4.0–10.5)
nRBC: 0 % (ref 0.0–0.2)

## 2024-06-03 LAB — COMPREHENSIVE METABOLIC PANEL WITH GFR
ALT: 26 U/L (ref 0–44)
AST: 31 U/L (ref 15–41)
Albumin: 4.8 g/dL (ref 3.5–5.0)
Alkaline Phosphatase: 118 U/L (ref 38–126)
Anion gap: 14 (ref 5–15)
BUN: 11 mg/dL (ref 8–23)
CO2: 24 mmol/L (ref 22–32)
Calcium: 10.1 mg/dL (ref 8.9–10.3)
Chloride: 108 mmol/L (ref 98–111)
Creatinine, Ser: 0.85 mg/dL (ref 0.44–1.00)
GFR, Estimated: >60 mL/min (ref >60)
Glucose, Bld: 111 mg/dL — ABNORMAL HIGH (ref 70–99)
Potassium: 3.7 mmol/L (ref 3.5–5.1)
Sodium: 146 mmol/L — ABNORMAL HIGH (ref 135–145)
Total Bilirubin: 0.6 mg/dL (ref 0.0–1.2)
Total Protein: 7.5 g/dL (ref 6.5–8.1)

## 2024-06-03 LAB — TROPONIN T, HIGH SENSITIVITY
Troponin T High Sensitivity: 16 ng/L (ref 0–19)
Troponin T High Sensitivity: 16 ng/L (ref 0–19)

## 2024-06-03 LAB — PROTIME-INR
INR: 0.9 (ref 0.8–1.2)
Prothrombin Time: 12.9 s (ref 11.4–15.2)

## 2024-06-03 LAB — D-DIMER, QUANTITATIVE: D-Dimer, Quant: 0.52 ug{FEU}/mL — ABNORMAL HIGH (ref 0.00–0.50)

## 2024-06-03 MED ORDER — ONDANSETRON HCL 4 MG/2ML IJ SOLN
4.0000 mg | Freq: Once | INTRAMUSCULAR | Status: AC
  Administered 2024-06-03: 4 mg via INTRAVENOUS
  Filled 2024-06-03: qty 2

## 2024-06-03 NOTE — ED Notes (Addendum)
 ambulated around the ED on RA the lowest oxygen dropped was 92%

## 2024-06-03 NOTE — ED Triage Notes (Signed)
 Pt here from home with c/o chest pain  non radiating , some sob no n/v started last night but got worse today

## 2024-06-03 NOTE — Discharge Instructions (Addendum)
 When you go home tonight please take your normal warfarin and take 1 extra dose as well.  Your level today was very thick.  This means I want you to take a total of 4 mg of warfarin when you get home tonight  Your doctor should write out your instructions exactly for you so that you can take your medications exactly as prescribed.  There is no signs of heart attack, you do have a breast lump that needs to be seen by your surgeon or your cancer doctor.  I gave you the phone number for your doctor above, you should be seen this week for that breast lump  Thank you for allowing us  to treat you in the emergency department today.  After reviewing your examination and potential testing that was done it appears that you are safe to go home.  I would like for you to follow-up with your doctor within the next several days, have them obtain your records and follow-up with them to review all potential tests and results from your visit.  If you should develop severe or worsening symptoms return to the emergency department immediately

## 2024-06-03 NOTE — ED Provider Notes (Signed)
 [place] EMERGENCY DEPARTMENT AT [facility] Provider Note   CSN: [id number] Arrival date & time: 06/03/24  1531     Patient presents with: Chest Pain and Hypertension   [name] is a 83 y.o. female.    Chest Pain Hypertension Associated symptoms include chest pain.   This patient is an 83 year old female, she has a history of cardiac disease status post obstructive coronary disease with intervention in April of this year.  She has a history of hypertension on hydralazine , Imdur , she also takes warfarin.  The patient had a evaluation in August for chest pain, she had a history of a non-ST elevation MI and had been admitted to the hospital in April, on 8 April she had a heart catheterization that showed a normal left main, had minimal obstructive disease, there was 70% stenosis in the diagonal 1 ostial which was thought unlikely to be the culprit.  She had been on warfarin for DVT treatment, they recommended against dual antiplatelet therapy at the time but to stick to only either aspirin  or Plavix at that time.  Echocardiogram was completed on 9 April which showed a normal ejection fraction with indeterminate left ventricular diastolic parameters.  The patient states she started having chest pain last night, she had gone to the quick care and was redirected to her because of blood pressure.  She is not short of breath but does have nausea and has not taken any of her medications today because she just did not have an appetite today    Prior to Admission medications   Medication Sig Start Date End Date Taking? Authorizing Provider  albuterol  (PROVENTIL  HFA;VENTOLIN  HFA) 108 (90 BASE) MCG/ACT inhaler Inhale 2 puffs into the lungs every 4 (four) hours as needed for shortness of breath. 06/04/15  Yes Raford Riggs, MD  albuterol  (PROVENTIL ) (2.5 MG/3ML) 0.083% nebulizer solution Take 2.5 mg by nebulization every 6 (six) hours as needed for wheezing or shortness of  breath.   Yes [provider]  Alirocumab  (PRALUENT ) 75 MG/ML SOAJ Inject 1 mL (75 mg total) into the skin every 14 (fourteen) days. 02/22/24  Yes Swinyer, Rosaline HERO, NP  amitriptyline  (ELAVIL ) 25 MG tablet Take 25 mg by mouth at bedtime.   Yes [provider]  anastrozole  (ARIMIDEX ) 1 MG tablet TAKE 1 TABLET BY MOUTH AT BEDTIME. 06/01/24  Yes Odean Potts, MD  carvedilol  (COREG ) 6.25 MG tablet Take 6.25 mg by mouth 2 (two) times daily. 05/09/24  Yes [provider]  dorzolamide -timolol  (COSOPT ) 22.3-6.8 MG/ML ophthalmic solution INSTILL 1 DROP INTO RIGHT EYE TWICE A DAY Patient taking differently: Place 1 drop into the right eye 2 (two) times daily. 08/28/20  Yes Valdemar Rogue, MD  FARXIGA  5 MG TABS tablet Take 5 mg by mouth every morning. 10/01/20  Yes [provider]  gabapentin  (NEURONTIN ) 300 MG capsule Take 300 mg by mouth 3 (three) times daily. 9am, 1pm & 9pm 05/08/19  Yes [provider]  hydrALAZINE  (APRESOLINE ) 25 MG tablet Take 25 mg by mouth in the morning and at bedtime. 05/09/24  Yes [provider]  hydrALAZINE  (APRESOLINE ) 50 MG tablet Take 1 tablet (50 mg total) by mouth 3 (three) times daily. 12/16/23  Yes Raford Riggs, MD  isosorbide  mononitrate (IMDUR ) 30 MG 24 hr tablet TAKE 1 TABLET BY MOUTH ONCE DAILY 01/13/24  Yes Raford Riggs, MD  latanoprost  (XALATAN ) 0.005 % ophthalmic solution Place 1 drop into the left eye in the morning and at bedtime.  Yes [provider]  meclizine  (ANTIVERT ) 25 MG tablet Take 25 mg by mouth every 6 (six) hours as needed for dizziness.   Yes [provider]  Multiple Vitamins-Minerals (PRESERVISION AREDS 2 PO) Take 1 capsule by mouth in the morning and at bedtime.   Yes [provider]  NON FORMULARY Take 1 each by mouth at bedtime. CBD Gummy   Yes [provider]  Omega-3 Fatty Acids (FISH OIL) 1000 MG CAPS Take 1,000 mg by mouth 2 (two) times daily. 9am &  9pm   Yes [provider]  warfarin (COUMADIN ) 2 MG tablet TAKE 1 TABLET BY MOUTH EVERY DAY AT BEDTIME OR AS DIRECTED BY THE COUMADIN  CLINIC *DISREGARD 30 DAY SUPPLY THIS IS 90 DAY SUPPLY* Patient taking differently: Take 2 mg by mouth See admin instructions. Take as directed by coumadin  clinic. Takes 1-2 tabs weekly, but sometimes can take it daily.  Between 1900-2000 01/13/24  Yes Raford Riggs, MD  Lancets [facility] DELICA PLUS LANCET30G) MISC at bedtime. 09/14/23   [provider]  [facility] ULTRA test strip 1 each by Other route at bedtime. 09/14/23   [provider]    Allergies: Alphagan  [brimonidine ], Diflunisal, Metformin and related, Vioxx [rofecoxib], Nexlizet  [bempedoic acid-ezetimibe], Repatha  [evolocumab ], Codeine, Elemental sulfur, Motrin [ibuprofen], Penicillins, and Pravastatin     Review of Systems  Cardiovascular:  Positive for chest pain.  All other systems reviewed and are negative.   Updated Vital Signs BP 128/74   Pulse 77   Temp 97.7 F (36.5 C) (Oral)   Resp 13   Ht 1.575 m (5' 2)   Wt 68.9 kg   SpO2 (!) 87%   BMI 27.80 kg/m   Physical Exam Vitals and nursing note reviewed.  Constitutional:      General: She is not in acute distress.    Appearance: She is well-developed.  HENT:     Head: Normocephalic and atraumatic.     Mouth/Throat:     Pharynx: No oropharyngeal exudate.  Eyes:     General: No scleral icterus.       Right eye: No discharge.        Left eye: No discharge.     Conjunctiva/sclera: Conjunctivae normal.     Pupils: Pupils are equal, round, and reactive to light.  Neck:     Thyroid : No thyromegaly.     Vascular: No JVD.  Cardiovascular:     Rate and Rhythm: Normal rate and regular rhythm.     Heart sounds: Normal heart sounds. No murmur heard.    No friction rub. No gallop.  Pulmonary:     Effort: Pulmonary effort is normal. No respiratory distress.     Breath sounds: Normal breath sounds. No wheezing  or rales.  Abdominal:     General: Bowel sounds are normal. There is no distension.     Palpations: Abdomen is soft. There is no mass.     Tenderness: There is no abdominal tenderness.  Musculoskeletal:        General: No tenderness. Normal range of motion.     Cervical back: Normal range of motion and neck supple.     Right lower leg: No edema.     Left lower leg: No edema.  Lymphadenopathy:     Cervical: No cervical adenopathy.  Skin:    General: Skin is warm and dry.     Findings: No erythema or rash.  Neurological:     General: No focal deficit present.  Mental Status: She is alert.     Coordination: Coordination normal.  Psychiatric:        Behavior: Behavior normal.     (all labs ordered are listed, but only abnormal results are displayed) Labs Reviewed  CBC - Abnormal; Notable for the following components:      Result Value   RBC 5.35 (*)    Hemoglobin 15.9 (*)    HCT 49.1 (*)    All other components within normal limits  COMPREHENSIVE METABOLIC PANEL WITH GFR - Abnormal; Notable for the following components:   Sodium 146 (*)    Glucose, Bld 111 (*)    All other components within normal limits  D-DIMER, QUANTITATIVE - Abnormal; Notable for the following components:   D-Dimer, Quant 0.52 (*)    All other components within normal limits  PROTIME-INR  TROPONIN T, HIGH SENSITIVITY  TROPONIN T, HIGH SENSITIVITY    EKG: EKG Interpretation Date/Time:  Sunday June 03 2024 15:51:38 EST Ventricular Rate:  89 PR Interval:  189 QRS Duration:  156 QT Interval:  418 QTC Calculation: 509 R Axis:   119  Text Interpretation: Sinus rhythm RBBB and LPFB ST segment abnormal lateral leads Confirmed by Cleotilde Rogue ([id number]) on 06/03/2024 3:54:51 PM  Radiology: [facility] Chest 2 View Result Date: 06/03/2024 CLINICAL DATA:  Chest pain and shortness of breath beginning yesterday and worsening. EXAM: CHEST - 2 VIEW COMPARISON:  02/16/2024 FINDINGS: Artifact overlies the chest.  Heart size is normal. Aortic atherosclerotic calcification is noted. Pulmonary vascularity is normal. No edema, infiltrate, collapse or effusion. No acute bone finding. IMPRESSION: No active disease. Aortic atherosclerotic calcification. Electronically Signed   By: Oneil Officer M.D.   On: 06/03/2024 16:53     Procedures   Medications Ordered in the ED  ondansetron  (ZOFRAN ) injection 4 mg (4 mg Intravenous Given 06/03/24 1604)                                    Medical Decision Making Amount and/or Complexity of Data Reviewed Labs: ordered. Radiology: ordered.  Risk Prescription drug management.   The patient has a normal cardiac exam at the bedside, she has good pulses no edema and no JVD.  She is experiencing ongoing chest discomfort which she states has been persistent since last night and has not left her at all.  Given the recent heart catheterization that she had approximately 7 months ago I think it is reasonable to check a troponin with a delta troponin and a chest x-ray and keep her on cardiac monitor.  This does not sound consistent with pulmonary embolism.  Her heart rate is down to about 85, her blood pressure is initially 234/116 but is currently down to 169 systolic.  I have looked at her EKG which shows that the patient has normal sinus rhythm, she has rightward axis, right bundle branch block,  Labs:  I  personally viewed and interpreted the labs which show troponin of 16, no delta change, CMP with unremarkable kidney and liver function and electrolytes, CBC without leukocytosis, D-dimer within normal range for age-adjusted, INR 0.9,    Radiology Imaging: I personally viewed the images of the ordered radiographic studies and find chest x-ray without any acute findings at all I agree with the radiologist interpretation as well   Meds / Interventions: while in the ED the patient received the following: Zofran  The response to the interventions was that  the patient  improved   The patient did not have any recurrent symptoms throughout her stay, she was chest pain-free at discharge with a blood pressure of 128/74, on my exam her oxygen level is 94% while she is talking to me in the bed.  She is ambulated without shortness of breath, when she is resting she drops to about 89 or 90% but has no symptoms.  At this time I do not think the patient needs further inpatient workup, she does need to have some modification of her current warfarin levels, I will have her take an extra dose tomorrow and talk with her family doctor tomorrow.  She is agreeable to the plan.  Doubt ACS, PE or dissection as causes of her chest pain  Prior to discharge the patient asked me to examine her right breast where she had had a prior breast cancer, she has found a new lump in that area, I did detect what appeared to be a small nodule, this will need to be seen in the outpatient setting by her cancer doctors.  She is agreeable to the plan      Final diagnoses:  Chest pain, unspecified type  Mass of right breast, unspecified quadrant    ED Discharge Orders     None          Cleotilde Rogue, MD 06/03/24 1927

## 2024-06-05 ENCOUNTER — Encounter (INDEPENDENT_AMBULATORY_CARE_PROVIDER_SITE_OTHER): Admitting: Ophthalmology

## 2024-06-05 DIAGNOSIS — E119 Type 2 diabetes mellitus without complications: Secondary | ICD-10-CM

## 2024-06-05 DIAGNOSIS — H401130 Primary open-angle glaucoma, bilateral, stage unspecified: Secondary | ICD-10-CM

## 2024-06-05 DIAGNOSIS — I10 Essential (primary) hypertension: Secondary | ICD-10-CM

## 2024-06-05 DIAGNOSIS — H35033 Hypertensive retinopathy, bilateral: Secondary | ICD-10-CM

## 2024-06-05 DIAGNOSIS — Z7984 Long term (current) use of oral hypoglycemic drugs: Secondary | ICD-10-CM

## 2024-06-05 DIAGNOSIS — Z961 Presence of intraocular lens: Secondary | ICD-10-CM

## 2024-06-05 DIAGNOSIS — H353231 Exudative age-related macular degeneration, bilateral, with active choroidal neovascularization: Secondary | ICD-10-CM

## 2024-06-05 NOTE — Progress Notes (Shared)
 [facility] - Clinic Note  06/12/2024     CHIEF COMPLAINT Patient presents for No chief complaint on file.  HISTORY OF PRESENT ILLNESS: [name] is a 83 y.o. female who presents to the clinic today for:   Pt states    Referring physician: Marvine Rush, MD [address] [place],  [place] [address]  HISTORICAL INFORMATION:  Selected notes from the MEDICAL RECORD NUMBER Referred by Dr. Oneil Kawasaki for concern of SRF OD LEE: 11.27.20 (M. Cotter) [BCVA: OD: 20/80-- OS: 20/60-]  Ocular Hx-glaucoma (latanoprost )  PMH-DM    CURRENT MEDICATIONS: Current Outpatient Medications (Ophthalmic Drugs)  Medication Sig   dorzolamide -timolol  (COSOPT ) 22.3-6.8 MG/ML ophthalmic solution INSTILL 1 DROP INTO RIGHT EYE TWICE A DAY (Patient taking differently: Place 1 drop into the right eye 2 (two) times daily.)   latanoprost  (XALATAN ) 0.005 % ophthalmic solution Place 1 drop into the left eye in the morning and at bedtime.   No current facility-administered medications for this visit. (Ophthalmic Drugs)   Current Outpatient Medications (Other)  Medication Sig   albuterol  (PROVENTIL  HFA;VENTOLIN  HFA) 108 (90 BASE) MCG/ACT inhaler Inhale 2 puffs into the lungs every 4 (four) hours as needed for shortness of breath.   albuterol  (PROVENTIL ) (2.5 MG/3ML) 0.083% nebulizer solution Take 2.5 mg by nebulization every 6 (six) hours as needed for wheezing or shortness of breath.   Alirocumab  (PRALUENT ) 75 MG/ML SOAJ Inject 1 mL (75 mg total) into the skin every 14 (fourteen) days.   amitriptyline  (ELAVIL ) 25 MG tablet Take 25 mg by mouth at bedtime.   anastrozole  (ARIMIDEX ) 1 MG tablet TAKE 1 TABLET BY MOUTH AT BEDTIME.   carvedilol  (COREG ) 6.25 MG tablet Take 6.25 mg by mouth 2 (two) times daily.   FARXIGA  5 MG TABS tablet Take 5 mg by mouth every morning.   gabapentin  (NEURONTIN ) 300 MG capsule Take 300 mg by mouth 3 (three) times daily. 9am, 1pm & 9pm   hydrALAZINE  (APRESOLINE )  25 MG tablet Take 25 mg by mouth in the morning and at bedtime.   hydrALAZINE  (APRESOLINE ) 50 MG tablet Take 1 tablet (50 mg total) by mouth 3 (three) times daily.   isosorbide  mononitrate (IMDUR ) 30 MG 24 hr tablet TAKE 1 TABLET BY MOUTH ONCE DAILY   Lancets (ONETOUCH DELICA PLUS LANCET30G) MISC at bedtime.   meclizine  (ANTIVERT ) 25 MG tablet Take 25 mg by mouth every 6 (six) hours as needed for dizziness.   Multiple Vitamins-Minerals (PRESERVISION AREDS 2 PO) Take 1 capsule by mouth in the morning and at bedtime.   NON FORMULARY Take 1 each by mouth at bedtime. CBD Gummy   Omega-3 Fatty Acids (FISH OIL) 1000 MG CAPS Take 1,000 mg by mouth 2 (two) times daily. 9am & 9pm   ONETOUCH ULTRA test strip 1 each by Other route at bedtime.   warfarin (COUMADIN ) 2 MG tablet TAKE 1 TABLET BY MOUTH EVERY DAY AT BEDTIME OR AS DIRECTED BY THE COUMADIN  CLINIC *DISREGARD 30 DAY SUPPLY THIS IS 90 DAY SUPPLY* (Patient taking differently: Take 2 mg by mouth See admin instructions. Take as directed by coumadin  clinic. Takes 1-2 tabs weekly, but sometimes can take it daily.  Between 1900-2000)   No current facility-administered medications for this visit. (Other)   REVIEW OF SYSTEMS:   ALLERGIES Allergies  Allergen Reactions   Alphagan  [Brimonidine ] Itching   Diflunisal Swelling    Entire body swelling   Metformin And Related Other (See Comments)  Kidney failure   Vioxx [Rofecoxib] Shortness Of Breath   Nexlizet  [Bempedoic Acid-Ezetimibe] Other (See Comments)    Causes elevated Liver and Kidney function   Repatha  [Evolocumab ] Other (See Comments)    Myalgias Pt takes this and states she is fine with this 06-03-24   Codeine Rash   Elemental Sulfur Rash   Motrin [Ibuprofen] Rash   Penicillins Rash   Pravastatin  Rash   PAST MEDICAL HISTORY Past Medical History:  Diagnosis Date   Antral gastritis    EGD 11/15   Arthritis    Asthmatic bronchitis    Back pain    Breast cancer (HCC)    right  breast   CAD in native artery 03/10/2021   Chronic diastolic heart failure (HCC) 05/20/2015   Grade 2 diastolic dysfunction.  04/2015.   Chronic kidney disease    kidney function low   Chronic stable angina 12/16/2023   COPD (chronic obstructive pulmonary disease) (HCC)    Diabetes mellitus    x 5 yrs   DVT of axillary vein, acute left (HCC) 07/24/2012   GERD (gastroesophageal reflux disease)    Glaucoma    POAG OU   Heart murmur    rheum fever at age 60   History of hiatal hernia    History of kidney stones    Hyperlipidemia 05/20/2015   Hypertension    Hypertensive retinopathy    OU   Hypothyroidism    Kidney stones    Macular degeneration    Wet OD, Dry OS   Mixed hyperlipidemia    OSA (obstructive sleep apnea) 12/17/2021   does not use cpap on regular basis   Peripheral venous insufficiency    Pinched nerve    right elbow   Pneumonia    PONV (postoperative nausea and vomiting)    Sigmoid diverticulitis    Snoring 03/10/2021   Vertigo    chonic   Past Surgical History:  Procedure Laterality Date   ABDOMINAL HYSTERECTOMY     BACK SURGERY     spinal    BREAST LUMPECTOMY WITH RADIOACTIVE SEED LOCALIZATION Right 12/03/2020   Procedure: RIGHT BREAST LUMPECTOMY WITH RADIOACTIVE SEED LOCALIZATION;  Surgeon: Belinda Cough, MD;  Location: [facility] OR;  Service: General;  Laterality: Right;   CARDIAC CATHETERIZATION N/A 05/26/2015   Procedure: Left Heart Cath and Coronary Angiography;  Surgeon: Candyce GORMAN Reek, MD;  Location: [facility] INVASIVE CV LAB;  Service: Cardiovascular;  Laterality: N/A;   CATARACT EXTRACTION Bilateral    CHOLECYSTECTOMY     COLON SURGERY     COLONOSCOPY N/A 12/27/2013   Procedure: COLONOSCOPY;  Surgeon: Claudis RAYMOND Rivet, MD;  Location: [facility] ENDO SUITE;  Service: Endoscopy;  Laterality: N/A;  200   COLOSTOMY CLOSURE     CYSTOSCOPY/URETEROSCOPY/HOLMIUM LASER/STENT PLACEMENT Right 11/24/2022   Procedure: CYSTOSCOPY RIGHT URETEROSCOPY/HOLMIUM LASER/STENT  PLACEMENT;  Surgeon: Carolee Sherwood JONETTA DOUGLAS, MD;  Location: [facility] ORS;  Service: Urology;  Laterality: Right;  60 MINS FOR CASE   ENDARTERECTOMY Left 03/10/2022   Procedure: LEFT CAROTID ENDARTERECTOMY;  Surgeon: Magda Debby SAILOR, MD;  Location: [facility] OR;  Service: Vascular;  Laterality: Left;   ESOPHAGOGASTRODUODENOSCOPY N/A 05/07/2014   Procedure: ESOPHAGOGASTRODUODENOSCOPY (EGD);  Surgeon: Claudis RAYMOND Rivet, MD;  Location: [facility];  Service: Endoscopy;  Laterality: N/A;   EYE SURGERY Bilateral    Cat Sx   fracture left foot     HERNIA REPAIR     LEFT HEART CATH AND CORONARY ANGIOGRAPHY N/A 10/11/2023   Procedure: LEFT HEART CATH  AND CORONARY ANGIOGRAPHY;  Surgeon: Elmira Newman PARAS, MD;  Location: [facility] INVASIVE CV LAB;  Service: Cardiovascular;  Laterality: N/A;   NM MYOCAR PERF WALL MOTION  01/28/2009   Normal   OTHER SURGICAL HISTORY     colostomy, colostomy reversal, for diverticulitis surgical hernia repair, arm surgery, neck surgery   PATCH ANGIOPLASTY Left 03/10/2022   Procedure: PATCH ANGIOPLASTY WITH 1X6CM GEORGE LISLE;  Surgeon: Magda Debby SAILOR, MD;  Location: [facility] OR;  Service: Vascular;  Laterality: Left;   US  ECHOCARDIOGRAPHY  02/11/2010   Mild MR,trace TR & AI   FAMILY HISTORY Family History  Problem Relation Age of Onset   CVA Maternal Grandmother 29       deceased   Heart disease Maternal Grandmother    Stroke Maternal Grandmother    Breast cancer Maternal Grandmother    Other Mother 66       Cause unknown   Cancer Mother        liver   Heart attack Brother 25       deceased   Breast cancer Sister    Leukemia Maternal Aunt    Glaucoma Maternal Uncle    Bone cancer Maternal Uncle    Spina bifida Daughter    SOCIAL HISTORY Social History   Tobacco Use   Smoking status: Former    Types: Cigarettes    Passive exposure: Never   Smokeless tobacco: Never   Tobacco comments:    Smoke 1-1 1/2 packs a day  Vaping Use   Vaping status: Never Used  Substance Use Topics    Alcohol  use: No   Drug use: No       OPHTHALMIC EXAM: Not recorded    IMAGING AND PROCEDURES  Imaging and Procedures for @TODAY @          ASSESSMENT/PLAN: No diagnosis found.  1. 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, OD  - delayed f/u from 12 wks to 58 on 03.20.24  - h/o delayed f/u from 8 wks to 12 on 6.20.22 due to lumpectomy -- dx'd w/ DCIS - h/o delayed follow up from 4 weeks to 8 weeks due to passing of husband (12.11.20-02.12.21)  - s/p IVA OD #1 (12.11.20), #2 (02.12.21), #3 (03.12.21), #4 (04.09.21), #5 (05.14.21), #6 (06.17.21), #7 (07.23.21), #8 (10.15.21), #9 (12.7.21), #10 (03.22.22), #11 (06.20.22), #12 (08.24.22), #13 (10.31.22), #14 (01.03.23), #15 (08.15.23), #16 (11.15.23), #17 (02.29.24), #18 (03.29.24), #19 (05.09.24), #20 (06.20.24), #21 (08.15.24), #22 (10.10.24), #23 (12.19.24), #24 (03.20.25), #25 (07.10.25), #26 (10.30.25)  - 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 OS  - Conversion to wet AMD noted on 10.30.25 visit  - s/p IVA OS #1 (10.30.25) - The incidence pathology and anatomy of wet AMD discussed   - The ANCHOR, MARINA , CATT and VIEW trials discussed with patient.    - discussed treatment options including observation vs intravitreal anti-VEGF agents such as Avastin , Lucentis, Eylea.    - Risks of  endophthalmitis and vascular occlusive events and atrophic changes discussed with patient  - OCT OS shows  interval development of focal PED w/ surrounding IRF/SRF superior macula, Partial PVD, patchy ORA  - recommend IVA OS #2 today, 12.09.25 due to development of CNV w/ IRF/SRF  - pt wishes to be treated with IVA  - RBA of procedure discussed, questions answered - IVA informed consent obtained and signed 10.30.25 - see procedure note   - f/u in 4wks DFE, OCT, likely inj OU  3,4. Diabetes mellitus, type 2 without retinopathy - The incidence, risk factors for progression, natural history and treatment options for diabetic retinopathy  were discussed with patient.   - The need for close monitoring of blood glucose, blood pressure, and serum lipids, avoiding cigarette or any type of tobacco, and the need for long term follow up was also discussed with patient.  - monitor  5,6. Hypertensive retinopathy OU  - discussed importance of tight BP control  - monitor  7. Pseudophakia OU, PCO OD  - s/p CE/IOL OU (Dr. Cyrus)  - IOL in good position  - s/p yag cap OD (04.06.22) -- good PC opening  - monitor  8. POAG OU  - formerly managed by Dr. Cyrus -- now following at [facility]  - s/p laser w/ Dr. Cyrus -- ?SLT  - s/p trab OD w/ Dr. JAYSON. Groat  - IOP 10, 18  - re-started Cosopt  per Dr. Octavia on 03.17.25  - currently on latanoprost  at bedtime OU  Ophthalmic Meds Ordered this visit:  No orders of the defined types were placed in this encounter.    No follow-ups on file.  There are no Patient Instructions on file for this visit.  This document serves as a record of services personally performed by Redell JUDITHANN Hans, MD, PhD. It was created on their behalf by Wanda GEANNIE Keens, COT an ophthalmic technician. The creation of this record is the provider's dictation and/or activities during the visit.    Electronically signed by:  Wanda GEANNIE Keens, COT  06/05/24 2:56 PM   Redell JUDITHANN Hans, M.D., Ph.D. Diseases & Surgery of the Retina and Vitreous [facility]    Abbreviations: M myopia (nearsighted); A astigmatism; H hyperopia (farsighted); P presbyopia; Mrx spectacle prescription;  CTL contact lenses; OD right eye; OS left eye; OU both eyes  XT exotropia; ET esotropia; PEK punctate epithelial keratitis; PEE punctate epithelial erosions; DES dry eye syndrome; MGD meibomian gland dysfunction; ATs artificial tears; PFAT's preservative free artificial tears; NSC nuclear sclerotic cataract; PSC posterior subcapsular cataract; ERM epi-retinal membrane; PVD posterior vitreous detachment; RD retinal detachment; DM diabetes mellitus; DR diabetic retinopathy; NPDR non-proliferative diabetic retinopathy; PDR proliferative diabetic retinopathy; CSME clinically significant macular edema; DME diabetic macular edema; dbh dot blot hemorrhages; CWS cotton wool spot; POAG primary open angle glaucoma; C/D cup-to-disc ratio; HVF humphrey visual field; GVF goldmann visual field; OCT optical coherence tomography; IOP intraocular pressure; BRVO Branch retinal vein occlusion; CRVO central retinal vein occlusion; CRAO central retinal artery occlusion; BRAO branch retinal artery occlusion; RT retinal tear; SB scleral buckle; PPV pars plana vitrectomy; VH Vitreous hemorrhage; PRP panretinal laser photocoagulation; IVK intravitreal kenalog ; VMT vitreomacular traction; MH Macular hole;  NVD neovascularization of the disc; NVE neovascularization elsewhere; AREDS age related eye disease study; ARMD age related macular degeneration; POAG primary open angle glaucoma; EBMD epithelial/anterior basement membrane dystrophy; ACIOL anterior chamber intraocular lens; IOL intraocular lens; PCIOL posterior chamber intraocular lens; Phaco/IOL phacoemulsification  with intraocular lens placement; PRK photorefractive keratectomy; LASIK laser assisted in situ keratomileusis; HTN hypertension; DM diabetes mellitus; COPD chronic obstructive pulmonary disease

## 2024-06-06 ENCOUNTER — Encounter (INDEPENDENT_AMBULATORY_CARE_PROVIDER_SITE_OTHER): Admitting: Ophthalmology

## 2024-06-06 ENCOUNTER — Ambulatory Visit

## 2024-06-06 ENCOUNTER — Telehealth: Payer: Self-pay | Admitting: *Deleted

## 2024-06-06 NOTE — Telephone Encounter (Signed)
 Pt was in Er Monday and wanted to let lisa know!

## 2024-06-11 ENCOUNTER — Ambulatory Visit: Attending: Cardiology

## 2024-06-11 DIAGNOSIS — I82A12 Acute embolism and thrombosis of left axillary vein: Secondary | ICD-10-CM

## 2024-06-11 DIAGNOSIS — Z5181 Encounter for therapeutic drug level monitoring: Secondary | ICD-10-CM

## 2024-06-11 LAB — POCT INR: INR: 1.3 — AB (ref 2.0–3.0)

## 2024-06-11 NOTE — Progress Notes (Signed)
 INR 1.3 Please see anticoagulation encounter

## 2024-06-11 NOTE — Patient Instructions (Signed)
 Take warfarin 2 tablets tonight, 3 tablets tomorrow night then increase dose to 1 tablet daily except 2 tablets on Tuesdays, Thursdays and Saturdays.  Recheck INR in 1 week

## 2024-06-12 ENCOUNTER — Encounter (INDEPENDENT_AMBULATORY_CARE_PROVIDER_SITE_OTHER): Admitting: Ophthalmology

## 2024-06-12 ENCOUNTER — Other Ambulatory Visit: Payer: Self-pay | Admitting: Cardiovascular Disease

## 2024-06-12 DIAGNOSIS — Z961 Presence of intraocular lens: Secondary | ICD-10-CM

## 2024-06-12 DIAGNOSIS — I10 Essential (primary) hypertension: Secondary | ICD-10-CM

## 2024-06-12 DIAGNOSIS — H35033 Hypertensive retinopathy, bilateral: Secondary | ICD-10-CM

## 2024-06-12 DIAGNOSIS — H353231 Exudative age-related macular degeneration, bilateral, with active choroidal neovascularization: Secondary | ICD-10-CM

## 2024-06-12 DIAGNOSIS — H401130 Primary open-angle glaucoma, bilateral, stage unspecified: Secondary | ICD-10-CM

## 2024-06-12 DIAGNOSIS — E119 Type 2 diabetes mellitus without complications: Secondary | ICD-10-CM

## 2024-06-12 DIAGNOSIS — Z7984 Long term (current) use of oral hypoglycemic drugs: Secondary | ICD-10-CM

## 2024-06-13 ENCOUNTER — Inpatient Hospital Stay: Attending: Hematology and Oncology | Admitting: Hematology and Oncology

## 2024-06-13 VITALS — BP 130/62 | HR 73 | Temp 98.6°F | Resp 16 | Ht 62.0 in | Wt 154.2 lb

## 2024-06-13 DIAGNOSIS — Z923 Personal history of irradiation: Secondary | ICD-10-CM | POA: Insufficient documentation

## 2024-06-13 DIAGNOSIS — D0511 Intraductal carcinoma in situ of right breast: Secondary | ICD-10-CM | POA: Diagnosis not present

## 2024-06-13 DIAGNOSIS — Z79811 Long term (current) use of aromatase inhibitors: Secondary | ICD-10-CM | POA: Diagnosis not present

## 2024-06-13 DIAGNOSIS — Z170 Estrogen receptor positive status [ER+]: Secondary | ICD-10-CM | POA: Diagnosis not present

## 2024-06-13 NOTE — Progress Notes (Signed)
 Patient Care Team: Marvine Rush, MD as PCP - General (Family Medicine) Dewey Rush, MD as Consulting Physician (Radiation Oncology) Odean Potts, MD as Consulting Physician (Hematology and Oncology) Belinda Cough, MD as Consulting Physician (General Surgery)  DIAGNOSIS:  Encounter Diagnosis  Name Primary?   Ductal carcinoma in situ (DCIS) of right breast Yes    SUMMARY OF ONCOLOGIC HISTORY: Oncology History  Ductal carcinoma in situ (DCIS) of right breast  10/09/2020 Initial Diagnosis   Screening mammogram detected right breast calcifications: Initial biopsy showed focal atypical ductal hyperplasia   12/03/2020 Surgery   12/03/2020: Right lumpectomy:  intermediate grade DCIS ER 90% positive, PR negative margins negative   12/29/2020 Cancer Staging   Staging form: Breast, AJCC 8th Edition - Clinical stage from 12/29/2020: Stage 0 (cTis (DCIS), cN0, cM0, G2, ER+, PR-, HER2: Not Assessed) - Signed by Odean Potts, MD on 12/29/2020 Stage prefix: Initial diagnosis Histologic grading system: 3 grade system   01/22/2021 - 02/19/2021 Radiation Therapy   Radiation Treatment Dates: 01/22/2021 through 02/19/2021 Site Technique Total Dose (Gy) Dose per Fx (Gy) Completed Fx Beam Energies  Breast, Right: Breast_Rt 3D 42.56/42.56 2.66 16/16 10X  Breast, Right: Breast_Rt_Bst 3D 8/8 2 4/4 6X, 10X   Initially was going to forego, however proceeded due to close margins.   02/2021 -  Anti-estrogen oral therapy   Anastrozole  daily   04/03/2021 Cancer Staging   Staging form: Breast, AJCC 8th Edition - Pathologic: Stage 0 (pTis (DCIS), pN0, cM0) - Signed by Crawford Morna Pickle, NP on 04/03/2021 Stage prefix: Initial diagnosis     CHIEF COMPLIANT: New lump in the right breast, follow-up on anastrozole  therapy  HISTORY OF PRESENT ILLNESS:  History of Present Illness [name] is an 83 year old female with ER-positive, PR-negative, intermediate grade ductal carcinoma in situ of the right  breast, status post lumpectomy and adjuvant radiation, who presents for evaluation of a new right breast lump.  She is in year 3.5 of a planned 5-year course of adjuvant anastrozole  after lumpectomy and radiation for DCIS. She tolerates anastrozole  with mild hot flashes that were present before starting therapy.  About one month ago she noticed a new palpable knot in the right breast. The area is sensitive but not painful. She denies skin changes or systemic symptoms. She has not had mammographic screening for two years due to lack of routine imaging follow-up.      ALLERGIES:  is allergic to alphagan  [brimonidine ], diflunisal, metformin and related, vioxx [rofecoxib], nexlizet  [bempedoic acid-ezetimibe], repatha  [evolocumab ], codeine, elemental sulfur, motrin [ibuprofen], penicillins, and pravastatin .  MEDICATIONS:  Current Outpatient Medications  Medication Sig Dispense Refill   albuterol  (PROVENTIL  HFA;VENTOLIN  HFA) 108 (90 BASE) MCG/ACT inhaler Inhale 2 puffs into the lungs every 4 (four) hours as needed for shortness of breath. 1 Inhaler 0   albuterol  (PROVENTIL ) (2.5 MG/3ML) 0.083% nebulizer solution Take 2.5 mg by nebulization every 6 (six) hours as needed for wheezing or shortness of breath.     Alirocumab  (PRALUENT ) 75 MG/ML SOAJ Inject 1 mL (75 mg total) into the skin every 14 (fourteen) days. 6 mL 3   amitriptyline  (ELAVIL ) 25 MG tablet Take 25 mg by mouth at bedtime.     anastrozole  (ARIMIDEX ) 1 MG tablet TAKE 1 TABLET BY MOUTH AT BEDTIME. 90 tablet 0   carvedilol  (COREG ) 6.25 MG tablet Take 6.25 mg by mouth 2 (two) times daily.     dorzolamide -timolol  (COSOPT ) 22.3-6.8 MG/ML ophthalmic solution INSTILL 1 DROP INTO RIGHT EYE TWICE A DAY (  Patient taking differently: Place 1 drop into the right eye 2 (two) times daily.) 30 mL 2   FARXIGA  5 MG TABS tablet Take 5 mg by mouth every morning.     gabapentin  (NEURONTIN ) 300 MG capsule Take 300 mg by mouth 3 (three) times daily. 9am, 1pm &  9pm     hydrALAZINE  (APRESOLINE ) 25 MG tablet Take 25 mg by mouth in the morning and at bedtime.     hydrALAZINE  (APRESOLINE ) 50 MG tablet Take 1 tablet (50 mg total) by mouth 3 (three) times daily. 270 tablet 3   isosorbide  mononitrate (IMDUR ) 30 MG 24 hr tablet TAKE 1 TABLET BY MOUTH ONCE DAILY 330 tablet 11   Lancets (ONETOUCH DELICA PLUS LANCET30G) MISC at bedtime.     latanoprost  (XALATAN ) 0.005 % ophthalmic solution Place 1 drop into the left eye in the morning and at bedtime.     meclizine  (ANTIVERT ) 25 MG tablet Take 25 mg by mouth every 6 (six) hours as needed for dizziness.     Multiple Vitamins-Minerals (PRESERVISION AREDS 2 PO) Take 1 capsule by mouth in the morning and at bedtime.     NON FORMULARY Take 1 each by mouth at bedtime. CBD Gummy     Omega-3 Fatty Acids (FISH OIL) 1000 MG CAPS Take 1,000 mg by mouth 2 (two) times daily. 9am & 9pm     ONETOUCH ULTRA test strip 1 each by Other route at bedtime.     warfarin (COUMADIN ) 2 MG tablet TAKE 1 TABLET BY MOUTH EVERY DAY AT BEDTIME OR AS DIRECTED BY THE COUMADIN  CLINIC *DISREGARD 30 DAY SUPPLY THIS IS 90 DAY SUPPLY* (Patient taking differently: Take 2 mg by mouth See admin instructions. Take as directed by coumadin  clinic. Takes 1-2 tabs weekly, but sometimes can take it daily.  Between 1900-2000) 90 tablet 11   No current facility-administered medications for this visit.    PHYSICAL EXAMINATION: ECOG PERFORMANCE STATUS: 1 - Symptomatic but completely ambulatory  Vitals:   06/13/24 1459  BP: 130/62  Pulse: 73  Resp: 16  Temp: 98.6 F (37 C)  SpO2: 98%   Filed Weights   06/13/24 1459  Weight: 154 lb 3.2 oz (69.9 kg)    Physical Exam Right breast palpable lump   BREAST: Right breast palpable lump identified as scar tissue.  (exam performed in the presence of a chaperone)  LABORATORY DATA:  I have reviewed the data as listed    Latest Ref Rng & Units 06/03/2024    3:51 PM 02/16/2024    3:55 PM 10/11/2023    9:08  AM  CMP  Glucose 70 - 99 mg/dL 888  98  888   BUN 8 - 23 mg/dL 11  16  15    Creatinine 0.44 - 1.00 mg/dL 9.14  9.06  9.00   Sodium 135 - 145 mmol/L 146  142  141   Potassium 3.5 - 5.1 mmol/L 3.7  3.8  4.1   Chloride 98 - 111 mmol/L 108  108  106   CO2 22 - 32 mmol/L 24  25  27    Calcium 8.9 - 10.3 mg/dL 89.8  9.4  9.2   Total Protein 6.5 - 8.1 g/dL 7.5     Total Bilirubin 0.0 - 1.2 mg/dL 0.6     Alkaline Phos 38 - 126 U/L 118     AST 15 - 41 U/L 31     ALT 0 - 44 U/L 26       Lab  Results  Component Value Date   WBC 6.1 06/03/2024   HGB 15.9 (H) 06/03/2024   HCT 49.1 (H) 06/03/2024   MCV 91.8 06/03/2024   PLT 216 06/03/2024   NEUTROABS 2.9 05/12/2023    ASSESSMENT & PLAN:  Ductal carcinoma in situ (DCIS) of right breast 12/03/2020: Screening mammogram detected right breast calcifications 2.8 cm biopsy revealed intermediate grade DCIS ER 90% positive, PR negative 12/03/2020: Right lumpectomy: DCIS intermediate grade with calcifications approximately 2 cm in size.  ER 90%, PR 0%   Current treatment: Anastrozole  started 12/29/2020 Anastrozole  toxicities: 1.  Hot flashes: Patient had these symptoms before she started anastrozole  2. stiffness in the hands She will complete 5 years of therapy by June 2027  New lump in the right breast: Will plan to obtain a mammogram and ultrasound and a biopsy  Breast cancer surveillance: 1.  Breast exam 06/13/2024: The palpable area of concern is felt to be scar tissue. 2. mammogram 09/14/2021: Benign breast density category B.  Patient will need a new mammogram I would like to order an immediate mammogram and ultrasound for further evaluation of the palpable area.     Orders Placed This Encounter  Procedures   MM DIAG BREAST TOMO BILATERAL    Standing Status:   Future    Expected Date:   07/14/2024    Expiration Date:   06/13/2025    Reason for Exam (SYMPTOM  OR DIAGNOSIS REQUIRED):   Palpable lump right breast scan    [facility]    Release to patient:   Immediate [1]   US  LIMITED ULTRASOUND INCLUDING AXILLA RIGHT BREAST    Standing Status:   Future    Expected Date:   07/14/2024    Expiration Date:   06/13/2025    Reason for Exam (SYMPTOM  OR DIAGNOSIS REQUIRED):   Palpable mass in the breast scar    Preferred imaging location?:   [facility]    Release to patient:   Immediate   The patient has a good understanding of the overall plan. she agrees with it. she will call with any problems that may develop before the next visit here.  I personally spent a total of 30 minutes in the care of the patient today including preparing to see the patient, getting/reviewing separately obtained history, performing a medically appropriate exam/evaluation, counseling and educating, placing orders, referring and communicating with other health care professionals, documenting clinical information in the EHR, independently interpreting results, communicating results, and coordinating care.   Viinay K Lorain Keast, MD 06/13/24

## 2024-06-13 NOTE — Assessment & Plan Note (Signed)
 12/03/2020: Screening mammogram detected right breast calcifications 2.8 cm biopsy revealed intermediate grade DCIS ER 90% positive, PR negative 12/03/2020: Right lumpectomy: DCIS intermediate grade with calcifications approximately 2 cm in size.  ER 90%, PR 0%   Current treatment: Anastrozole  started 12/29/2020 Anastrozole  toxicities: 1.  Hot flashes: Patient had these symptoms before she started anastrozole  2. stiffness in the hands She will complete 5 years of therapy by June 2027  Breast cancer surveillance: 1.  Breast exam 06/13/2024: Benign 2. mammogram 09/14/2021: Benign breast density category B.  Patient will need a new mammogram

## 2024-06-18 ENCOUNTER — Ambulatory Visit

## 2024-06-21 ENCOUNTER — Ambulatory Visit: Attending: Cardiology

## 2024-06-21 DIAGNOSIS — Z5181 Encounter for therapeutic drug level monitoring: Secondary | ICD-10-CM

## 2024-06-21 DIAGNOSIS — I82A12 Acute embolism and thrombosis of left axillary vein: Secondary | ICD-10-CM

## 2024-06-21 LAB — POCT INR: INR: 1.2 — AB (ref 2.0–3.0)

## 2024-06-21 NOTE — Patient Instructions (Signed)
 Take warfarin 2 tablets daily until INR check on 06/27/24

## 2024-06-21 NOTE — Progress Notes (Signed)
 INR 1.2; Please see anticoagulation encounter

## 2024-06-27 ENCOUNTER — Ambulatory Visit: Attending: Cardiology | Admitting: *Deleted

## 2024-06-27 DIAGNOSIS — I82A12 Acute embolism and thrombosis of left axillary vein: Secondary | ICD-10-CM | POA: Diagnosis not present

## 2024-06-27 DIAGNOSIS — Z5181 Encounter for therapeutic drug level monitoring: Secondary | ICD-10-CM

## 2024-06-27 LAB — POCT INR: INR: 3.8 — AB (ref 2.0–3.0)

## 2024-06-27 NOTE — Progress Notes (Signed)
 INR 3.8 Please see anticoagulation encounter

## 2024-06-27 NOTE — Patient Instructions (Signed)
 Hold warfarin tonight then restart 1 tablet daily except 2 tablets on Tuesdays, Thursdays and Saturdays Recheck in 2 weeks

## 2024-06-30 ENCOUNTER — Other Ambulatory Visit: Payer: Self-pay

## 2024-06-30 ENCOUNTER — Emergency Department (HOSPITAL_COMMUNITY)

## 2024-06-30 ENCOUNTER — Encounter (HOSPITAL_COMMUNITY): Payer: Self-pay | Admitting: Emergency Medicine

## 2024-06-30 ENCOUNTER — Emergency Department (HOSPITAL_COMMUNITY)
Admission: EM | Admit: 2024-06-30 | Discharge: 2024-06-30 | Disposition: A | Discharge status: Home / Self Care | Attending: Emergency Medicine | Admitting: Emergency Medicine

## 2024-06-30 DIAGNOSIS — J4489 Other specified chronic obstructive pulmonary disease: Secondary | ICD-10-CM | POA: Diagnosis not present

## 2024-06-30 DIAGNOSIS — S3992XA Unspecified injury of lower back, initial encounter: Secondary | ICD-10-CM | POA: Diagnosis present

## 2024-06-30 DIAGNOSIS — N189 Chronic kidney disease, unspecified: Secondary | ICD-10-CM | POA: Diagnosis not present

## 2024-06-30 DIAGNOSIS — Y92000 Kitchen of unspecified non-institutional (private) residence as  the place of occurrence of the external cause: Secondary | ICD-10-CM | POA: Insufficient documentation

## 2024-06-30 DIAGNOSIS — I130 Hypertensive heart and chronic kidney disease with heart failure and stage 1 through stage 4 chronic kidney disease, or unspecified chronic kidney disease: Secondary | ICD-10-CM | POA: Diagnosis not present

## 2024-06-30 DIAGNOSIS — E039 Hypothyroidism, unspecified: Secondary | ICD-10-CM | POA: Diagnosis not present

## 2024-06-30 DIAGNOSIS — S0990XA Unspecified injury of head, initial encounter: Secondary | ICD-10-CM | POA: Diagnosis not present

## 2024-06-30 DIAGNOSIS — M25511 Pain in right shoulder: Secondary | ICD-10-CM | POA: Insufficient documentation

## 2024-06-30 DIAGNOSIS — Z853 Personal history of malignant neoplasm of breast: Secondary | ICD-10-CM | POA: Insufficient documentation

## 2024-06-30 DIAGNOSIS — I2510 Atherosclerotic heart disease of native coronary artery without angina pectoris: Secondary | ICD-10-CM | POA: Diagnosis not present

## 2024-06-30 DIAGNOSIS — S339XXA Sprain of unspecified parts of lumbar spine and pelvis, initial encounter: Secondary | ICD-10-CM | POA: Diagnosis not present

## 2024-06-30 DIAGNOSIS — W01198A Fall on same level from slipping, tripping and stumbling with subsequent striking against other object, initial encounter: Secondary | ICD-10-CM | POA: Diagnosis not present

## 2024-06-30 DIAGNOSIS — Z87442 Personal history of urinary calculi: Secondary | ICD-10-CM | POA: Diagnosis not present

## 2024-06-30 DIAGNOSIS — I5032 Chronic diastolic (congestive) heart failure: Secondary | ICD-10-CM | POA: Insufficient documentation

## 2024-06-30 DIAGNOSIS — W19XXXA Unspecified fall, initial encounter: Secondary | ICD-10-CM

## 2024-06-30 DIAGNOSIS — S335XXA Sprain of ligaments of lumbar spine, initial encounter: Secondary | ICD-10-CM

## 2024-06-30 LAB — CBC WITH DIFFERENTIAL/PLATELET
Abs Immature Granulocytes: 0.05 K/uL (ref 0.00–0.07)
Basophils Absolute: 0.1 K/uL (ref 0.0–0.1)
Basophils Relative: 1 %
Eosinophils Absolute: 0.2 K/uL (ref 0.0–0.5)
Eosinophils Relative: 2 %
HCT: 43.4 % (ref 36.0–46.0)
Hemoglobin: 14.4 g/dL (ref 12.0–15.0)
Immature Granulocytes: 1 %
Lymphocytes Relative: 29 %
Lymphs Abs: 1.9 K/uL (ref 0.7–4.0)
MCH: 30 pg (ref 26.0–34.0)
MCHC: 33.2 g/dL (ref 30.0–36.0)
MCV: 90.4 fL (ref 80.0–100.0)
Monocytes Absolute: 0.5 K/uL (ref 0.1–1.0)
Monocytes Relative: 8 %
Neutro Abs: 4 K/uL (ref 1.7–7.7)
Neutrophils Relative %: 59 %
Platelets: 232 K/uL (ref 150–400)
RBC: 4.8 MIL/uL (ref 3.87–5.11)
RDW: 13 % (ref 11.5–15.5)
WBC: 6.6 K/uL (ref 4.0–10.5)
nRBC: 0 % (ref 0.0–0.2)

## 2024-06-30 LAB — BASIC METABOLIC PANEL WITH GFR
Anion gap: 14 (ref 5–15)
BUN: 11 mg/dL (ref 8–23)
CO2: 22 mmol/L (ref 22–32)
Calcium: 9.3 mg/dL (ref 8.9–10.3)
Chloride: 108 mmol/L (ref 98–111)
Creatinine, Ser: 0.86 mg/dL (ref 0.44–1.00)
GFR, Estimated: >60 mL/min
Glucose, Bld: 160 mg/dL — ABNORMAL HIGH (ref 70–99)
Potassium: 3.4 mmol/L — ABNORMAL LOW (ref 3.5–5.1)
Sodium: 144 mmol/L (ref 135–145)

## 2024-06-30 LAB — PROTIME-INR
INR: 1.6 — ABNORMAL HIGH (ref 0.8–1.2)
Prothrombin Time: 19.4 s — ABNORMAL HIGH (ref 11.4–15.2)

## 2024-06-30 MED ORDER — ACETAMINOPHEN 500 MG PO TABS
1000.0000 mg | ORAL_TABLET | Freq: Once | ORAL | Status: AC
  Administered 2024-06-30: 1000 mg via ORAL
  Filled 2024-06-30: qty 2

## 2024-06-30 MED ORDER — POTASSIUM CHLORIDE CRYS ER 20 MEQ PO TBCR
20.0000 meq | EXTENDED_RELEASE_TABLET | Freq: Once | ORAL | Status: AC
  Administered 2024-06-30: 20 meq via ORAL
  Filled 2024-06-30: qty 1

## 2024-06-30 NOTE — ED Provider Notes (Signed)
 " [place] AT [facility] Provider Note  CSN: [id number] Arrival date & time: 06/30/24 1559  Chief Complaint(s) Fall  HPI [name] is a 83 y.o. female history of COPD, diabetes, prior DVT on warfarin presenting to the emergency department with head injury.  Patient reports that she was in the kitchen, had a fall backwards and hit her head on the dishwasher, also injured the right shoulder.  She reports some low back pain and neck pain.  No headache.  She does take warfarin so came in to get checked out.  Reports that she was able to ambulate after the fall.  No loss of consciousness.  No nausea or vomiting.  She reports immediately after the fall she had an episode of blurry vision that is now resolved.  Denies any eye pain or injury to the eye.   Past Medical History Past Medical History:  Diagnosis Date   Antral gastritis    EGD 11/15   Arthritis    Asthmatic bronchitis    Back pain    Breast cancer (HCC)    right breast   CAD in native artery 03/10/2021   Chronic diastolic heart failure (HCC) 05/20/2015   Grade 2 diastolic dysfunction.  04/2015.   Chronic kidney disease    kidney function low   Chronic stable angina 12/16/2023   COPD (chronic obstructive pulmonary disease) (HCC)    Diabetes mellitus    x 5 yrs   DVT of axillary vein, acute left (HCC) 07/24/2012   GERD (gastroesophageal reflux disease)    Glaucoma    POAG OU   Heart murmur    rheum fever at age 62   History of hiatal hernia    History of kidney stones    Hyperlipidemia 05/20/2015   Hypertension    Hypertensive retinopathy    OU   Hypothyroidism    Kidney stones    Macular degeneration    Wet OD, Dry OS   Mixed hyperlipidemia    OSA (obstructive sleep apnea) 12/17/2021   does not use cpap on regular basis   Peripheral venous insufficiency    Pinched nerve    right elbow   Pneumonia    PONV (postoperative nausea and vomiting)    Sigmoid diverticulitis     Snoring 03/10/2021   Vertigo    chonic   Patient Active Problem List   Diagnosis Date Noted   Chronic stable angina 12/16/2023   Aortic atherosclerosis 10/12/2023   NSTEMI (non-ST elevated myocardial infarction) (HCC) 10/11/2023   Chest pain 10/10/2023   Carotid stenosis 03/10/2022   Carotid stenosis, asymptomatic 03/10/2022   OSA (obstructive sleep apnea) 12/17/2021   CAD in native artery 03/10/2021   Snoring 03/10/2021   Ductal carcinoma in situ (DCIS) of right breast 12/29/2020   Long term (current) use of anticoagulants 06/24/2020   Medication management 07/01/2016   Abnormal nuclear stress test    Chronic diastolic heart failure (HCC) 05/20/2015   Hyperlipidemia 05/20/2015   Annual physical exam 06/13/2014   Antral gastritis 05/07/2014   Anemia 05/06/2014   AKI (acute kidney injury) 05/06/2014   Type 2 diabetes mellitus without complication    RUQ pain 05/03/2014   Rectal bleeding 12/10/2013   Pelvic mass in female 11/06/2013   Epistaxis 08/19/2012   DVT (deep venous thrombosis) (HCC) 08/19/2012   DM (diabetes mellitus) (HCC) 08/19/2012   HTN (hypertension) 08/19/2012   Home Medication(s) Prior to Admission medications  Medication Sig Start Date End Date  Taking? Authorizing Provider  albuterol  (PROVENTIL  HFA;VENTOLIN  HFA) 108 (90 BASE) MCG/ACT inhaler Inhale 2 puffs into the lungs every 4 (four) hours as needed for shortness of breath. 06/04/15   Raford Riggs, MD  albuterol  (PROVENTIL ) (2.5 MG/3ML) 0.083% nebulizer solution Take 2.5 mg by nebulization every 6 (six) hours as needed for wheezing or shortness of breath.    [provider]  Alirocumab  (PRALUENT ) 75 MG/ML SOAJ Inject 1 mL (75 mg total) into the skin every 14 (fourteen) days. 02/22/24   Swinyer, Rosaline HERO, NP  amitriptyline  (ELAVIL ) 25 MG tablet Take 25 mg by mouth at bedtime.    [provider]  anastrozole  (ARIMIDEX ) 1 MG tablet TAKE 1 TABLET BY MOUTH AT BEDTIME. 06/01/24   Odean Potts, MD  carvedilol  (COREG ) 6.25 MG tablet Take 6.25 mg by mouth 2 (two) times daily. 05/09/24   [provider]  dorzolamide -timolol  (COSOPT ) 22.3-6.8 MG/ML ophthalmic solution INSTILL 1 DROP INTO RIGHT EYE TWICE A DAY Patient taking differently: Place 1 drop into the right eye 2 (two) times daily. 08/28/20   Valdemar Rogue, MD  FARXIGA  5 MG TABS tablet Take 5 mg by mouth every morning. 10/01/20   [provider]  gabapentin  (NEURONTIN ) 300 MG capsule Take 300 mg by mouth 3 (three) times daily. 9am, 1pm & 9pm 05/08/19   [provider]  hydrALAZINE  (APRESOLINE ) 25 MG tablet Take 1 tablet (25 mg total) by mouth in the morning and at bedtime. 06/14/24   Raford Riggs, MD  hydrALAZINE  (APRESOLINE ) 50 MG tablet Take 1 tablet (50 mg total) by mouth 3 (three) times daily. 12/16/23   Raford Riggs, MD  isosorbide  mononitrate (IMDUR ) 30 MG 24 hr tablet TAKE 1 TABLET BY MOUTH ONCE DAILY 01/13/24   Raford Riggs, MD  Lancets [facility] DELICA PLUS LANCET30G) MISC at bedtime. 09/14/23   [provider]  latanoprost  (XALATAN ) 0.005 % ophthalmic solution Place 1 drop into the left eye in the morning and at bedtime.    [provider]  meclizine  (ANTIVERT ) 25 MG tablet Take 25 mg by mouth every 6 (six) hours as needed for dizziness.    [provider]  Multiple Vitamins-Minerals (PRESERVISION AREDS 2 PO) Take 1 capsule by mouth in the morning and at bedtime.    [provider]  NON FORMULARY Take 1 each by mouth at bedtime. CBD Gummy    [provider]  Omega-3 Fatty Acids (FISH OIL) 1000 MG CAPS Take 1,000 mg by mouth 2 (two) times daily. 9am & 9pm    [provider]  ONETOUCH ULTRA test strip 1 each by Other route at bedtime. 09/14/23   [provider]  warfarin (COUMADIN ) 2 MG tablet TAKE 1 TABLET BY MOUTH EVERY DAY AT BEDTIME OR AS DIRECTED BY THE COUMADIN  CLINIC *DISREGARD 30 DAY SUPPLY THIS IS 90 DAY  SUPPLY* Patient taking differently: Take 2 mg by mouth See admin instructions. Take as directed by coumadin  clinic. Takes 1-2 tabs weekly, but sometimes can take it daily.  Between 1900-2000 01/13/24   Raford Riggs, MD  Past Surgical History Past Surgical History:  Procedure Laterality Date   ABDOMINAL HYSTERECTOMY     BACK SURGERY     spinal    BREAST LUMPECTOMY WITH RADIOACTIVE SEED LOCALIZATION Right 12/03/2020   Procedure: RIGHT BREAST LUMPECTOMY WITH RADIOACTIVE SEED LOCALIZATION;  Surgeon: Belinda Cough, MD;  Location: [facility] OR;  Service: General;  Laterality: Right;   CARDIAC CATHETERIZATION N/A 05/26/2015   Procedure: Left Heart Cath and Coronary Angiography;  Surgeon: Candyce GORMAN Reek, MD;  Location: [facility] INVASIVE CV LAB;  Service: Cardiovascular;  Laterality: N/A;   CATARACT EXTRACTION Bilateral    CHOLECYSTECTOMY     COLON SURGERY     COLONOSCOPY N/A 12/27/2013   Procedure: COLONOSCOPY;  Surgeon: Claudis RAYMOND Rivet, MD;  Location: [facility] ENDO SUITE;  Service: Endoscopy;  Laterality: N/A;  200   COLOSTOMY CLOSURE     CYSTOSCOPY/URETEROSCOPY/HOLMIUM LASER/STENT PLACEMENT Right 11/24/2022   Procedure: CYSTOSCOPY RIGHT URETEROSCOPY/HOLMIUM LASER/STENT PLACEMENT;  Surgeon: Carolee Sherwood JONETTA DOUGLAS, MD;  Location: [facility] ORS;  Service: Urology;  Laterality: Right;  60 MINS FOR CASE   ENDARTERECTOMY Left 03/10/2022   Procedure: LEFT CAROTID ENDARTERECTOMY;  Surgeon: Magda Debby SAILOR, MD;  Location: [facility] OR;  Service: Vascular;  Laterality: Left;   ESOPHAGOGASTRODUODENOSCOPY N/A 05/07/2014   Procedure: ESOPHAGOGASTRODUODENOSCOPY (EGD);  Surgeon: Claudis RAYMOND Rivet, MD;  Location: [facility];  Service: Endoscopy;  Laterality: N/A;   EYE SURGERY Bilateral    Cat Sx   fracture left foot     HERNIA REPAIR     LEFT HEART CATH AND CORONARY ANGIOGRAPHY N/A 10/11/2023   Procedure: LEFT  HEART CATH AND CORONARY ANGIOGRAPHY;  Surgeon: Elmira Newman PARAS, MD;  Location: [facility] INVASIVE CV LAB;  Service: Cardiovascular;  Laterality: N/A;   NM MYOCAR PERF WALL MOTION  01/28/2009   Normal   OTHER SURGICAL HISTORY     colostomy, colostomy reversal, for diverticulitis surgical hernia repair, arm surgery, neck surgery   PATCH ANGIOPLASTY Left 03/10/2022   Procedure: PATCH ANGIOPLASTY WITH 1X6CM GEORGE LISLE;  Surgeon: Magda Debby SAILOR, MD;  Location: [facility] OR;  Service: Vascular;  Laterality: Left;   US  ECHOCARDIOGRAPHY  02/11/2010   Mild MR,trace TR & AI   Family History Family History  Problem Relation Age of Onset   CVA Maternal Grandmother 50       deceased   Heart disease Maternal Grandmother    Stroke Maternal Grandmother    Breast cancer Maternal Grandmother    Other Mother 58       Cause unknown   Cancer Mother        liver   Heart attack Brother 63       deceased   Breast cancer Sister    Leukemia Maternal Aunt    Glaucoma Maternal Uncle    Bone cancer Maternal Uncle    Spina bifida Daughter     Social History Social History[1] Allergies Alphagan  [brimonidine ], Diflunisal, Metformin and related, Vioxx [rofecoxib], Nexlizet  [bempedoic acid-ezetimibe], Repatha  [evolocumab ], Codeine, Elemental sulfur, Motrin [ibuprofen], Penicillins, and Pravastatin   Review of Systems Review of Systems  All other systems reviewed and are negative.   Physical Exam Vital Signs  I have reviewed the triage vital signs BP (!) 161/73   Pulse 84   Temp 97.9 F (36.6 C) (Temporal)   Resp 18   Ht 5' 2 (1.575 m)   Wt 68.9 kg   SpO2 99%   BMI 27.80 kg/m  Physical Exam Vitals and nursing note reviewed.  Constitutional:  General: She is not in acute distress.    Appearance: She is well-developed.  HENT:     Head: Normocephalic and atraumatic.     Mouth/Throat:     Mouth: Mucous membranes are moist.  Eyes:     Pupils: Pupils are equal, round, and reactive to light.   Cardiovascular:     Rate and Rhythm: Normal rate and regular rhythm.     Heart sounds: No murmur heard. Pulmonary:     Effort: Pulmonary effort is normal. No respiratory distress.     Breath sounds: Normal breath sounds.  Abdominal:     General: Abdomen is flat.     Palpations: Abdomen is soft.     Tenderness: There is no abdominal tenderness.  Musculoskeletal:        General: No tenderness.     Right lower leg: No edema.     Left lower leg: No edema.     Comments: Mild midline and paraspinal cervical lumbar tenderness.  No thoracic tenderness.  Full range of motion of the shoulder but mild pain to the posterior right shoulder.  Left upper and bilateral lower extremities atraumatic without deformity.  Skin:    General: Skin is warm and dry.  Neurological:     General: No focal deficit present.     Mental Status: She is alert. Mental status is at baseline.  Psychiatric:        Mood and Affect: Mood normal.        Behavior: Behavior normal.     ED Results and Treatments Labs (all labs ordered are listed, but only abnormal results are displayed) Labs Reviewed  PROTIME-INR - Abnormal; Notable for the following components:      Result Value   Prothrombin Time 19.4 (*)    INR 1.6 (*)    All other components within normal limits  BASIC METABOLIC PANEL WITH GFR - Abnormal; Notable for the following components:   Potassium 3.4 (*)    Glucose, Bld 160 (*)    All other components within normal limits  CBC WITH DIFFERENTIAL/PLATELET                                                                                                                          Radiology DG Shoulder Right Result Date: 06/30/2024 EXAM: 1 VIEW(S) XRAY OF THE _LATERALITY_ SHOULDER 06/30/2024 06:01:00 PM COMPARISON: None available. CLINICAL HISTORY: fall FINDINGS: BONES AND JOINTS: Degenerative changes of the acromioclavicular and glenohumeral joints. Partially visualized cervical spine fusion hardware noted. No  acute fracture. No malalignment. SOFT TISSUES: No abnormal calcifications. Visualized lung is unremarkable. IMPRESSION: 1. No evidence of acute traumatic injury. Electronically signed by: Morgane Naveau MD 06/30/2024 06:49 PM EST RP Workstation: [id number]   CT Lumbar Spine Wo Contrast Result Date: 06/30/2024 EXAM: CT OF THE LUMBAR SPINE WITHOUT CONTRAST 06/30/2024 05:52:28 PM TECHNIQUE: CT of the lumbar spine was performed without the administration of intravenous contrast. Multiplanar reformatted images are provided for review. Automated exposure  control, iterative reconstruction, and/or weight based adjustment of the mA/kV was utilized to reduce the radiation dose to as low as reasonably achievable. COMPARISON: None available. CLINICAL HISTORY: Back trauma, no prior imaging (Age >= 16y). FINDINGS: BONES AND ALIGNMENT: Normal vertebral body heights. No acute fracture or suspicious bone lesion. Mild retrolisthesis of L4 on L5. DEGENERATIVE CHANGES: Multilevel T12-L2 with disc space vacuum phenomenon. Moderate-to-severe L4-L5 intervertebral disc space narrowing. Multilevel mild degenerative changes of the spine. No severe osseous neural foraminal or central canal stenosis. SOFT TISSUES: Atherosclerotic plaque: Severe. Surgical changes of the colon. Nonobstructive right nephrolithiasis. IMPRESSION: 1. No evidence of acute traumatic injury. 2. Diffusely distended density. 3. Severe atherosclerotic plaque. 4. Nonobstructive right nephrolithiasis. Electronically signed by: Morgane Naveau MD 06/30/2024 06:48 PM EST RP Workstation: [id number]   CT Cervical Spine Wo Contrast Result Date: 06/30/2024 EXAM: CT CERVICAL SPINE WITHOUT CONTRAST 06/30/2024 05:52:28 PM TECHNIQUE: CT of the cervical spine was performed without the administration of intravenous contrast. Multiplanar reformatted images are provided for review. Automated exposure control, iterative reconstruction, and/or weight based adjustment of the mA/kV  was utilized to reduce the radiation dose to as low as reasonably achievable. COMPARISON: None available. CLINICAL HISTORY: Neck trauma (Age >= 65y) FINDINGS: BONES AND ALIGNMENT: C4-C6 cervical discectomy and fusion surgical hardware. Diffusely decreased bone density. No acute fracture or traumatic malalignment. DEGENERATIVE CHANGES: Multilevel moderate degenerative change in the spine. No severe osseous neural foraminal or central canal stenosis. SOFT TISSUES: No prevertebral soft tissue swelling. LUNGS: Bilateral pleural thickening with fullness scarring and associated calcifications. IMPRESSION: 1. No acute findings in the cervical spine. 2. C4-C6 cervical discectomy and fusion hardware. Electronically signed by: Morgane Naveau MD 06/30/2024 06:47 PM EST RP Workstation: [id number]   CT Head Wo Contrast Result Date: 06/30/2024 EXAM: CT HEAD WITHOUT CONTRAST 06/30/2024 05:52:28 PM TECHNIQUE: CT of the head was performed without the administration of intravenous contrast. Automated exposure control, iterative reconstruction, and/or weight based adjustment of the mA/kV was utilized to reduce the radiation dose to as low as reasonably achievable. COMPARISON: 05/12/2023, 06/15/2005 CLINICAL HISTORY: Head trauma, moderate-severe. FINDINGS: BRAIN AND VENTRICLES: No acute hemorrhage. No evidence of acute infarct. No hydrocephalus. No extra-axial collection. No mass effect or midline shift. Patchy and confluent areas of decreased attenuation are noted throughout the deep and periventricular white matter of the cerebral hemispheres bilaterally suggestive of chronic microvascular ischemic changes. Atherosclerotic calcifications are present within the cavernous internal carotid arteries. ORBITS: Bilateral cataract resection. SINUSES: No acute abnormality. SOFT TISSUES AND SKULL: No acute soft tissue abnormality. No skull fracture. Stable chronic sclerotic density in left frontal bone, likely benign. IMPRESSION: 1. No  acute intracranial abnormality. Electronically signed by: Morgane Naveau MD 06/30/2024 06:40 PM EST RP Workstation: [id number]    Pertinent labs & imaging results that were available during my care of the patient were reviewed by me and considered in my medical decision making (see MDM for details).  Medications Ordered in ED Medications  potassium chloride  SA (KLOR-CON  M) CR tablet 20 mEq (has no administration in time range)  acetaminophen  (TYLENOL ) tablet 1,000 mg (has no administration in time range)  Procedures Procedures  (including critical care time)  Medical Decision Making / ED Course   MDM:  83 year old presenting to the emergency department with fall  Patient is on warfarin so we will check testing including CT head.  Given age will subject CT cervical spine and CT lumbar spine.  Low concern for acute fracture or dislocation of the shoulder but will check x-ray of the right shoulder.  Will check basic labs including PT/INR given warfarin use.  Clinical Course as of 06/30/24 1936  Sat Jun 30, 2024  1922 Potassium(!): 3.4 [WS]  1934 Imaging without evidence for acute process.  Labs with mild low INR.  Discussed with patient. Will discharge patient to home. All questions answered. Patient comfortable with plan of discharge. Return precautions discussed with patient and specified on the after visit summary.  [WS]    Clinical Course User Index [WS] Francesca Elsie CROME, MD     Additional history obtained: -External records from outside source obtained and reviewed including: Chart review including previous notes, labs, imaging, consultation notes including prior notes    Lab Tests: -I ordered, reviewed, and interpreted labs.   The pertinent results include:   Labs Reviewed  PROTIME-INR - Abnormal; Notable for the following components:       Result Value   Prothrombin Time 19.4 (*)    INR 1.6 (*)    All other components within normal limits  BASIC METABOLIC PANEL WITH GFR - Abnormal; Notable for the following components:   Potassium 3.4 (*)    Glucose, Bld 160 (*)    All other components within normal limits  CBC WITH DIFFERENTIAL/PLATELET    Notable for mild subtherapeutic INR, mild hypokalemia    Imaging Studies ordered: I ordered imaging studies including CT scan  On my interpretation imaging demonstrates no acute process I independently visualized and interpreted imaging. I agree with the radiologist interpretation   Medicines ordered and prescription drug management: Meds ordered this encounter  Medications   potassium chloride  SA (KLOR-CON  M) CR tablet 20 mEq   acetaminophen  (TYLENOL ) tablet 1,000 mg    -I have reviewed the patients home medicines and have made adjustments as needed  Social Determinants of Health:  Diagnosis or treatment significantly limited by social determinants of health: lives alone   Reevaluation: After the interventions noted above, I reevaluated the patient and found that their symptoms have improved  Co morbidities that complicate the patient evaluation  Past Medical History:  Diagnosis Date   Antral gastritis    EGD 11/15   Arthritis    Asthmatic bronchitis    Back pain    Breast cancer (HCC)    right breast   CAD in native artery 03/10/2021   Chronic diastolic heart failure (HCC) 05/20/2015   Grade 2 diastolic dysfunction.  04/2015.   Chronic kidney disease    kidney function low   Chronic stable angina 12/16/2023   COPD (chronic obstructive pulmonary disease) (HCC)    Diabetes mellitus    x 5 yrs   DVT of axillary vein, acute left (HCC) 07/24/2012   GERD (gastroesophageal reflux disease)    Glaucoma    POAG OU   Heart murmur    rheum fever at age 35   History of hiatal hernia    History of kidney stones    Hyperlipidemia 05/20/2015   Hypertension     Hypertensive retinopathy    OU   Hypothyroidism    Kidney stones    Macular degeneration  Wet OD, Dry OS   Mixed hyperlipidemia    OSA (obstructive sleep apnea) 12/17/2021   does not use cpap on regular basis   Peripheral venous insufficiency    Pinched nerve    right elbow   Pneumonia    PONV (postoperative nausea and vomiting)    Sigmoid diverticulitis    Snoring 03/10/2021   Vertigo    chonic      Dispostion: Disposition decision including need for hospitalization was considered, and patient discharged from emergency department.    Final Clinical Impression(s) / ED Diagnoses Final diagnoses:  Fall, initial encounter  Acute pain of right shoulder  Lumbar sprain, initial encounter     This chart was dictated using voice recognition software.  Despite best efforts to proofread,  errors can occur which can change the documentation meaning.     [1]  Social History Tobacco Use   Smoking status: Former    Types: Cigarettes    Passive exposure: Never   Smokeless tobacco: Never   Tobacco comments:    Smoke 1-1 1/2 packs a day  Vaping Use   Vaping status: Never Used  Substance Use Topics   Alcohol  use: No   Drug use: No     Francesca Elsie CROME, MD 06/30/24 1936  "

## 2024-06-30 NOTE — ED Triage Notes (Signed)
 Pov c/o fall that happened around 1530. Pt fell in their kitchen landing on their butt, falling backwards with right shoulder and head hitting dishwasher. Pt denies LOC. Pt states they are on blood thinners and has a stent in their eye that the fall caused vision changes. Pt states that left eye vision is getting clearer. Pt is ambulatory in triage with a cane

## 2024-06-30 NOTE — Discharge Instructions (Signed)
 We evaluated you for your fall and head injury.  Your x-rays and CT scans are negative for any new injury.  Please take 1000 mg of Tylenol  every 6 hours as needed for pain.  You can also apply ice to the areas of pain or soreness.  Your INR was slightly low.  Please discuss this with your INR clinic when you see them this week.  Please return if you have any new or worsening symptoms.  Be careful at home to avoid future falls.

## 2024-07-02 NOTE — Progress Notes (Signed)
 " Cardiology Office Note:  .   Date:  07/10/2024  ID:  [name], DOB 1940/07/31, MRN [id number] PCP: Patient, No Pcp Per  [facility] Providers Cardiologist:  None    History of Present Illness: .   [name] is a 83 y.o. female  with a history of COPD, diabetes, prior DVT on warfarin  history of CAD, (50% LAD stenosis), chronic diastolic heart failure (grade 2), hypertension, hyperlipidemia, prior DVT on warfarin, breast cancer s/p lumpectomy and XRT, DM 2, carotid stenosis s/p left carotid endarterectomy in 03/2022, right bundle branch block.   Myoview  in 04/2015 for chest pain indicated LVEF 70% with small defect of moderate severity in the mid anterior and apical anterior region.  Felt to be due to breast attenuation artifact.  Underwent LHC in 05/2015 with 50% LAD lesion.  There was concern for component of vasospasm and long-acting nitrates were added.  10/2023 with NSTEMI. Left heart cath revealed 70% OM lesion and otherwise nonobstructive disease. Medical management recommended. Echo normal LVEF.   Patient presented to the emergency department with head injury 06/30/24. Patient reports that she was in the kitchen, had a fall backwards and hit her head on the dishwasher, also injured the right shoulder. She says she turned quickly and fell down. Denies dizziness or presyncope during that episode but does admit to dizziness if she turns her head quickly at times. She denies chest pain, dyspnea, edema. Rare palpitations. No regular exercise, walks with a cane. Thinking about going to the senior center or [facility]. She is scheduled for a breast biopsy today in [place].BP running high today and forgot to take her meds. She also ate a biscuit this am.  ROS:    Studies Reviewed: SABRA    EKG Interpretation Date/Time:  Tuesday July 10 2024 10:39:02 EST Ventricular Rate:  73 PR Interval:  196 QRS Duration:  132 QT Interval:  422 QTC Calculation: 464 R Axis:   118  Text  Interpretation: Unusual P axis, possible ectopic atrial rhythm Right bundle branch block Left posterior fascicular block Bifascicular block unchanged Confirmed by Parthenia [name] [phone]) on 07/10/2024 10:43:05 AM    Prior CV Studies:   Echo 10/2023 IMPRESSIONS     1. Left ventricular ejection fraction, by estimation, is 60 to 65%. The  left ventricle has normal function. The left ventricle has no regional  wall motion abnormalities. Left ventricular diastolic parameters are  indeterminate.   2. Right ventricular systolic function is normal. The right ventricular  size is normal.   3. The mitral valve is grossly normal. Trivial mitral valve  regurgitation. No evidence of mitral stenosis.   4. The aortic valve is grossly normal. Aortic valve regurgitation is not  visualized. No aortic stenosis is present.   5. The inferior vena cava is normal in size with greater than 50%  respiratory variability, suggesting right atrial pressure of 3 mmHg.   Comparison(s): No significant change from prior study.   Conclusion(s)/Recommendation(s): Normal biventricular function without  evidence of hemodynamically significant valvular heart disease.  Cath  Coronary angiography 10/11/2023: LM: Normal LAD: Prox 20% disease, distal 40% disease         Diag 1 ostial 70% stenosis, TIMI III flow, unlikely to be acute culprit Lcx: Mid 20% disease RCA: Prox 10% disease   LVEDP 12 mmHg      No culprit lesion identified to explain NSTEMI. Patient is currently on warfarin for DVT treatment. I do not think she needs DAPT.  At the most, I would recommend adding one antiplatelet agent, either Aspirin  81 mg daily or Plavix 75 mg daily.    Newman JINNY Lawrence, MD        Risk Assessment/Calculations:     HYPERTENSION CONTROL Vitals:   07/10/24 1007 07/10/24 1041  BP: (!) 144/70 (!) 160/80    The patient's blood pressure is elevated above target today.  In order to address the patient's elevated BP: Blood  pressure will be monitored at home to determine if medication changes need to be made.; Follow up with primary care provider for management.; Follow up with general cardiology has been recommended.          Physical Exam:   VS:  BP (!) 160/80   Pulse 74   Ht 5' 2 (1.575 m)   Wt 156 lb (70.8 kg)   SpO2 96%   BMI 28.53 kg/m    Orhtostatics: No data found. Wt Readings from Last 3 Encounters:  07/10/24 156 lb (70.8 kg)  06/30/24 152 lb (68.9 kg)  06/13/24 154 lb 3.2 oz (69.9 kg)    GEN: Well nourished, well developed in no acute distress NECK: No JVD; No carotid bruits CARDIAC:  RRR, no murmurs, rubs, gallops RESPIRATORY:  Clear to auscultation without rales, wheezing or rhonchi  ABDOMEN: Soft, non-tender, non-distended EXTREMITIES:  No edema; No deformity   ASSESSMENT AND PLAN: .    CAD nonobstructive, medical therapy-no angina.  Fall and hit her head while on coumadin  06/30/2024 CT ok. Has dizziness when she turns quickly and has meclizine . No EKG done in ED-EKG today-bifascicular block unchanged.  Will check zio to rule out arrhythmia. Having a breast biopsy today so she will come back later for zio to be placed.  Chronic diastolic CHF-compensated  HTN-BP elevated today. Forgot to take her meds today and stressed about breast biopsy this afternoon. We had her take her meds in the office and she will monitor BP at home.   HLD-on praluent   Carotid disease s/p left endarterectomy  History of DVt on coumadin   COPD        Dispo: f/u Dr. Alvan in 6 months or sooner pending home BP readings and zio monitor.  Signed, Olivia Pavy, PA-C   "

## 2024-07-04 ENCOUNTER — Ambulatory Visit
Admission: RE | Admit: 2024-07-04 | Discharge: 2024-07-04 | Disposition: A | Discharge status: Home / Self Care | Source: Ambulatory Visit | Attending: Hematology and Oncology | Admitting: Hematology and Oncology

## 2024-07-04 ENCOUNTER — Other Ambulatory Visit: Payer: Self-pay | Admitting: Hematology and Oncology

## 2024-07-04 DIAGNOSIS — N63 Unspecified lump in unspecified breast: Secondary | ICD-10-CM

## 2024-07-04 DIAGNOSIS — D0511 Intraductal carcinoma in situ of right breast: Secondary | ICD-10-CM

## 2024-07-10 ENCOUNTER — Ambulatory Visit: Attending: Physician Assistant | Admitting: Physician Assistant

## 2024-07-10 ENCOUNTER — Inpatient Hospital Stay
Admission: RE | Admit: 2024-07-10 | Discharge: 2024-07-10 | Attending: Hematology and Oncology | Admitting: Hematology and Oncology

## 2024-07-10 ENCOUNTER — Ambulatory Visit
Admission: RE | Admit: 2024-07-10 | Discharge: 2024-07-10 | Disposition: A | Discharge status: Home / Self Care | Source: Ambulatory Visit | Attending: Hematology and Oncology | Admitting: Hematology and Oncology

## 2024-07-10 ENCOUNTER — Ambulatory Visit: Attending: Physician Assistant

## 2024-07-10 ENCOUNTER — Encounter: Payer: Self-pay | Admitting: Physician Assistant

## 2024-07-10 VITALS — BP 160/80 | HR 74 | Ht 62.0 in | Wt 156.0 lb

## 2024-07-10 DIAGNOSIS — W19XXXD Unspecified fall, subsequent encounter: Secondary | ICD-10-CM

## 2024-07-10 DIAGNOSIS — I10 Essential (primary) hypertension: Secondary | ICD-10-CM

## 2024-07-10 DIAGNOSIS — I6529 Occlusion and stenosis of unspecified carotid artery: Secondary | ICD-10-CM

## 2024-07-10 DIAGNOSIS — E785 Hyperlipidemia, unspecified: Secondary | ICD-10-CM

## 2024-07-10 DIAGNOSIS — R42 Dizziness and giddiness: Secondary | ICD-10-CM

## 2024-07-10 DIAGNOSIS — W19XXXS Unspecified fall, sequela: Secondary | ICD-10-CM

## 2024-07-10 DIAGNOSIS — I25118 Atherosclerotic heart disease of native coronary artery with other forms of angina pectoris: Secondary | ICD-10-CM

## 2024-07-10 DIAGNOSIS — D0511 Intraductal carcinoma in situ of right breast: Secondary | ICD-10-CM

## 2024-07-10 DIAGNOSIS — I5032 Chronic diastolic (congestive) heart failure: Secondary | ICD-10-CM | POA: Diagnosis not present

## 2024-07-10 DIAGNOSIS — Z86718 Personal history of other venous thrombosis and embolism: Secondary | ICD-10-CM

## 2024-07-10 DIAGNOSIS — N63 Unspecified lump in unspecified breast: Secondary | ICD-10-CM

## 2024-07-10 HISTORY — PX: BREAST BIOPSY: SHX20

## 2024-07-10 NOTE — Patient Instructions (Signed)
 Medication Instructions:  Your physician recommends that you continue on your current medications as directed. Please refer to the Current Medication list given to you today.  *If you need a refill on your cardiac medications before your next appointment, please call your pharmacy*  Lab Work: NONE   If you have labs (blood work) drawn today and your tests are completely normal, you will receive your results only by: MyChart Message (if you have MyChart) OR A paper copy in the mail If you have any lab test that is abnormal or we need to change your treatment, we will call you to review the results.  Testing/Procedures: ZIO XT- Long Term Monitor Instructions   Your physician has requested you wear your ZIO patch monitor___14____days.   This is a single patch monitor.  [employer] supplies one patch monitor per enrollment.  Additional stickers are not available.   Please do not apply patch if you will be having a Nuclear Stress Test, Echocardiogram, Cardiac CT, MRI, or Chest Xray during the time frame you would be wearing the monitor. The patch cannot be worn during these tests.  You cannot remove and re-apply the ZIO XT patch monitor.   Your ZIO patch monitor will be sent USPS Priority mail from [facility] directly to your home address. The monitor may also be mailed to a PO BOX if home delivery is not available.   It may take 3-5 days to receive your monitor after you have been enrolled.   Once you have received you monitor, please review enclosed instructions.  Your monitor has already been registered assigning a specific monitor serial # to you.       Do not shower for the first 24 hours.  You may shower after the first 24 hours.   Press button if you feel a symptom. You will hear a small click.  Record Date, Time and Symptom in the Patient Log Book.   When you are ready to remove patch, follow instructions on last 2 pages of Patient Log Book.  Stick patch monitor onto last page  of Patient Log Book.   Place Patient Log Book in [place] box.  Use locking tab on box and tape box closed securely.  The [employer] has [employer] on it.  Please place in mailbox as soon as possible.  Your physician should have your test results approximately 7 days after the monitor has been mailed back to [facility].   Call [facility] Customer Care at [phone] if you have questions regarding your ZIO XT patch monitor.  Call them immediately if you see an orange light blinking on your monitor.   If your monitor falls off in less than 4 days contact our Monitor department at [phone].  If your monitor becomes loose or falls off after 4 days call [employer] at [phone] for suggestions on securing your monitor.    Follow-Up: At [facility], you and your health needs are our priority.  As part of our continuing mission to provide you with exceptional heart care, our providers are all part of one team.  This team includes your primary Cardiologist (physician) and Advanced Practice Providers or APPs (Physician Assistants and Nurse Practitioners) who all work together to provide you with the care you need, when you need it.  Your next appointment:   6 month(s)  Provider:   Dorn Ross, MD    We recommend signing up for the patient portal called MyChart.  Sign up information is  provided on this After Visit Summary.  MyChart is used to connect with patients for Virtual Visits (Telemedicine).  Patients are able to view lab/test results, encounter notes, upcoming appointments, etc.  Non-urgent messages can be sent to your provider as well.   To learn more about what you can do with MyChart, go to [email or web address].   Other Instructions Thank you for choosing [facility]!

## 2024-07-11 ENCOUNTER — Other Ambulatory Visit (HOSPITAL_BASED_OUTPATIENT_CLINIC_OR_DEPARTMENT_OTHER): Payer: Self-pay | Admitting: Cardiovascular Disease

## 2024-07-11 ENCOUNTER — Ambulatory Visit: Attending: Cardiology | Admitting: *Deleted

## 2024-07-11 DIAGNOSIS — Z5181 Encounter for therapeutic drug level monitoring: Secondary | ICD-10-CM | POA: Diagnosis not present

## 2024-07-11 DIAGNOSIS — I82A12 Acute embolism and thrombosis of left axillary vein: Secondary | ICD-10-CM

## 2024-07-11 LAB — POCT INR: INR: 1 — AB (ref 2.0–3.0)

## 2024-07-11 LAB — SURGICAL PATHOLOGY

## 2024-07-11 NOTE — Progress Notes (Signed)
 INR 1.0; Please see anticoagulation encounter

## 2024-07-11 NOTE — Patient Instructions (Signed)
 Take warfarin 3 tablets tonight then resume 1 tablet daily except 2 tablets on Tuesdays, Thursdays and Saturdays Recheck in 2 weeks

## 2024-07-24 ENCOUNTER — Ambulatory Visit

## 2024-07-25 ENCOUNTER — Ambulatory Visit

## 2024-07-27 ENCOUNTER — Other Ambulatory Visit (HOSPITAL_BASED_OUTPATIENT_CLINIC_OR_DEPARTMENT_OTHER): Payer: Self-pay | Admitting: Family

## 2024-07-27 DIAGNOSIS — I10 Essential (primary) hypertension: Secondary | ICD-10-CM

## 2024-07-27 DIAGNOSIS — I25118 Atherosclerotic heart disease of native coronary artery with other forms of angina pectoris: Secondary | ICD-10-CM

## 2024-08-01 ENCOUNTER — Ambulatory Visit: Attending: Cardiology

## 2024-08-02 ENCOUNTER — Ambulatory Visit: Admitting: *Deleted

## 2024-08-02 DIAGNOSIS — I82A12 Acute embolism and thrombosis of left axillary vein: Secondary | ICD-10-CM | POA: Diagnosis not present

## 2024-08-02 DIAGNOSIS — Z5181 Encounter for therapeutic drug level monitoring: Secondary | ICD-10-CM

## 2024-08-02 LAB — POCT INR: INR: 1 — AB (ref 2.0–3.0)

## 2024-08-02 NOTE — Progress Notes (Signed)
 INR 1.0

## 2024-08-02 NOTE — Patient Instructions (Addendum)
 Take warfarin 2 tablets daily except 1 tablet on Mondays Recheck 08/08/24 Discussed with pt the possibility of her missing doses.  She said she missed last night but that 's all. Readings do not correlate.  Discussed increased risk of blood clot/CVA when INR is low and she misses doses.  She verbalized understanding.

## 2024-08-08 ENCOUNTER — Other Ambulatory Visit (HOSPITAL_COMMUNITY)
Admission: RE | Admit: 2024-08-08 | Discharge: 2024-08-08 | Disposition: A | Source: Ambulatory Visit | Attending: Cardiology | Admitting: Cardiology

## 2024-08-08 ENCOUNTER — Ambulatory Visit: Admitting: *Deleted

## 2024-08-08 DIAGNOSIS — Z5181 Encounter for therapeutic drug level monitoring: Secondary | ICD-10-CM

## 2024-08-08 DIAGNOSIS — I82A12 Acute embolism and thrombosis of left axillary vein: Secondary | ICD-10-CM

## 2024-08-08 LAB — PROTIME-INR
INR: 4.3 (ref 0.8–1.2)
Prothrombin Time: 43.5 s — ABNORMAL HIGH (ref 11.4–15.2)

## 2024-08-08 LAB — POCT INR: INR: 8 — AB (ref 2.0–3.0)

## 2024-08-08 NOTE — Patient Instructions (Signed)
 POC INR >8.0  Sent to [facility] for STAT PT/INR    INR 4.3 Hold warfarin tonight and tomorrow night then resume 2 tablets daily except 1 tablet on Mondays and Thursdays Pt denies S/S of bleeding.  Bleeding and fall precautions discussed with pt and she verbalized understanding.

## 2024-08-09 ENCOUNTER — Ambulatory Visit: Payer: Self-pay | Admitting: Cardiology

## 2024-08-20 ENCOUNTER — Ambulatory Visit

## 2025-06-13 ENCOUNTER — Inpatient Hospital Stay: Admitting: Hematology and Oncology
